# Patient Record
Sex: Female | Born: 1970 | Race: Black or African American | Hispanic: No | Marital: Single | Smoking: Never smoker
Health system: Southern US, Community
[De-identification: ages and names within clinical notes are randomized; demographics above are authoritative.]

## PROBLEM LIST (undated history)

## (undated) DIAGNOSIS — F419 Anxiety disorder, unspecified: Secondary | ICD-10-CM

## (undated) DIAGNOSIS — C50919 Malignant neoplasm of unspecified site of unspecified female breast: Secondary | ICD-10-CM

## (undated) DIAGNOSIS — Z9221 Personal history of antineoplastic chemotherapy: Secondary | ICD-10-CM

## (undated) DIAGNOSIS — T451X5A Adverse effect of antineoplastic and immunosuppressive drugs, initial encounter: Principal | ICD-10-CM

## (undated) DIAGNOSIS — K5120 Ulcerative (chronic) proctitis without complications: Secondary | ICD-10-CM

## (undated) DIAGNOSIS — D701 Agranulocytosis secondary to cancer chemotherapy: Principal | ICD-10-CM

## (undated) DIAGNOSIS — I10 Essential (primary) hypertension: Secondary | ICD-10-CM

## (undated) DIAGNOSIS — M1990 Unspecified osteoarthritis, unspecified site: Secondary | ICD-10-CM

## (undated) DIAGNOSIS — J45909 Unspecified asthma, uncomplicated: Secondary | ICD-10-CM

## (undated) DIAGNOSIS — K219 Gastro-esophageal reflux disease without esophagitis: Secondary | ICD-10-CM

## (undated) DIAGNOSIS — Z923 Personal history of irradiation: Secondary | ICD-10-CM

## (undated) DIAGNOSIS — E119 Type 2 diabetes mellitus without complications: Secondary | ICD-10-CM

## (undated) DIAGNOSIS — F32A Depression, unspecified: Secondary | ICD-10-CM

## (undated) DIAGNOSIS — Z9109 Other allergy status, other than to drugs and biological substances: Secondary | ICD-10-CM

## (undated) DIAGNOSIS — D500 Iron deficiency anemia secondary to blood loss (chronic): Secondary | ICD-10-CM

## (undated) DIAGNOSIS — C50411 Malignant neoplasm of upper-outer quadrant of right female breast: Secondary | ICD-10-CM

## (undated) DIAGNOSIS — L309 Dermatitis, unspecified: Secondary | ICD-10-CM

## (undated) HISTORY — DX: Ulcerative (chronic) proctitis without complications: K51.20

## (undated) HISTORY — DX: Iron deficiency anemia secondary to blood loss (chronic): D50.0

## (undated) HISTORY — DX: Type 2 diabetes mellitus without complications: E11.9

## (undated) HISTORY — DX: Other allergy status, other than to drugs and biological substances: Z91.09

## (undated) HISTORY — DX: Adverse effect of antineoplastic and immunosuppressive drugs, initial encounter: T45.1X5A

## (undated) HISTORY — DX: Malignant neoplasm of upper-outer quadrant of right female breast: C50.411

## (undated) HISTORY — DX: Dermatitis, unspecified: L30.9

## (undated) HISTORY — DX: Malignant neoplasm of unspecified site of unspecified female breast: C50.919

## (undated) HISTORY — PX: FLEXIBLE SIGMOIDOSCOPY: SHX1649

## (undated) HISTORY — DX: Gastro-esophageal reflux disease without esophagitis: K21.9

## (undated) HISTORY — DX: Agranulocytosis secondary to cancer chemotherapy: D70.1

## (undated) HISTORY — PX: ESOPHAGOGASTRODUODENOSCOPY: SHX1529

## (undated) HISTORY — PX: DILATION AND CURETTAGE OF UTERUS: SHX78

---

## 2001-06-15 ENCOUNTER — Ambulatory Visit (HOSPITAL_COMMUNITY): Admission: RE | Admit: 2001-06-15 | Discharge: 2001-06-15 | Payer: Self-pay | Admitting: Internal Medicine

## 2003-02-07 ENCOUNTER — Ambulatory Visit (HOSPITAL_COMMUNITY): Admission: RE | Admit: 2003-02-07 | Discharge: 2003-02-07 | Payer: Self-pay | Admitting: Internal Medicine

## 2003-04-10 ENCOUNTER — Observation Stay (HOSPITAL_COMMUNITY): Admission: RE | Admit: 2003-04-10 | Discharge: 2003-04-11 | Payer: Self-pay | Admitting: Obstetrics and Gynecology

## 2004-08-18 ENCOUNTER — Ambulatory Visit: Payer: Self-pay | Admitting: Internal Medicine

## 2004-10-29 ENCOUNTER — Ambulatory Visit: Payer: Self-pay | Admitting: Internal Medicine

## 2005-02-21 ENCOUNTER — Ambulatory Visit: Payer: Self-pay | Admitting: Cardiology

## 2007-02-05 ENCOUNTER — Ambulatory Visit: Payer: Self-pay | Admitting: Cardiology

## 2007-02-22 ENCOUNTER — Ambulatory Visit: Payer: Self-pay | Admitting: Cardiology

## 2007-03-07 ENCOUNTER — Ambulatory Visit: Payer: Self-pay | Admitting: Cardiology

## 2007-03-27 ENCOUNTER — Ambulatory Visit: Payer: Self-pay | Admitting: Cardiology

## 2007-09-25 ENCOUNTER — Encounter: Payer: Self-pay | Admitting: Endocrinology

## 2007-10-11 ENCOUNTER — Ambulatory Visit: Payer: Self-pay | Admitting: Endocrinology

## 2007-10-11 DIAGNOSIS — F329 Major depressive disorder, single episode, unspecified: Secondary | ICD-10-CM

## 2007-10-11 DIAGNOSIS — Z860100 Personal history of colon polyps, unspecified: Secondary | ICD-10-CM | POA: Insufficient documentation

## 2007-10-11 DIAGNOSIS — F32A Depression, unspecified: Secondary | ICD-10-CM | POA: Insufficient documentation

## 2007-10-11 DIAGNOSIS — Z87448 Personal history of other diseases of urinary system: Secondary | ICD-10-CM | POA: Insufficient documentation

## 2007-10-11 DIAGNOSIS — R519 Headache, unspecified: Secondary | ICD-10-CM | POA: Insufficient documentation

## 2007-10-11 DIAGNOSIS — E782 Mixed hyperlipidemia: Secondary | ICD-10-CM | POA: Insufficient documentation

## 2007-10-11 DIAGNOSIS — R51 Headache: Secondary | ICD-10-CM

## 2007-10-11 DIAGNOSIS — K219 Gastro-esophageal reflux disease without esophagitis: Secondary | ICD-10-CM

## 2007-10-11 DIAGNOSIS — K279 Peptic ulcer, site unspecified, unspecified as acute or chronic, without hemorrhage or perforation: Secondary | ICD-10-CM | POA: Insufficient documentation

## 2007-10-11 DIAGNOSIS — Z9189 Other specified personal risk factors, not elsewhere classified: Secondary | ICD-10-CM | POA: Insufficient documentation

## 2007-10-11 DIAGNOSIS — E109 Type 1 diabetes mellitus without complications: Secondary | ICD-10-CM | POA: Insufficient documentation

## 2007-10-11 DIAGNOSIS — D649 Anemia, unspecified: Secondary | ICD-10-CM

## 2007-11-01 ENCOUNTER — Ambulatory Visit: Payer: Self-pay | Admitting: Endocrinology

## 2007-11-01 DIAGNOSIS — K5190 Ulcerative colitis, unspecified, without complications: Secondary | ICD-10-CM

## 2007-11-15 ENCOUNTER — Ambulatory Visit: Payer: Self-pay | Admitting: Endocrinology

## 2007-12-07 ENCOUNTER — Ambulatory Visit: Payer: Self-pay | Admitting: Endocrinology

## 2007-12-10 ENCOUNTER — Encounter: Payer: Self-pay | Admitting: Endocrinology

## 2008-01-16 ENCOUNTER — Ambulatory Visit: Payer: Self-pay | Admitting: Endocrinology

## 2008-02-15 ENCOUNTER — Ambulatory Visit: Payer: Self-pay | Admitting: Endocrinology

## 2008-04-17 ENCOUNTER — Ambulatory Visit: Payer: Self-pay | Admitting: Endocrinology

## 2008-04-17 DIAGNOSIS — E041 Nontoxic single thyroid nodule: Secondary | ICD-10-CM

## 2008-07-10 ENCOUNTER — Ambulatory Visit: Payer: Self-pay | Admitting: Endocrinology

## 2008-07-18 ENCOUNTER — Ambulatory Visit: Payer: Self-pay | Admitting: Endocrinology

## 2008-08-14 ENCOUNTER — Ambulatory Visit: Payer: Self-pay | Admitting: Endocrinology

## 2008-09-11 ENCOUNTER — Ambulatory Visit: Payer: Self-pay | Admitting: Endocrinology

## 2008-12-16 ENCOUNTER — Ambulatory Visit: Payer: Self-pay | Admitting: Endocrinology

## 2008-12-16 LAB — CONVERTED CEMR LAB
Creatinine,U: 65.4 mg/dL
Microalb Creat Ratio: 7.6 mg/g (ref 0.0–30.0)
Microalb, Ur: 0.5 mg/dL (ref 0.0–1.9)

## 2009-02-06 ENCOUNTER — Ambulatory Visit: Payer: Self-pay | Admitting: Endocrinology

## 2009-02-27 ENCOUNTER — Ambulatory Visit: Payer: Self-pay | Admitting: Endocrinology

## 2009-02-27 LAB — CONVERTED CEMR LAB: Hgb A1c MFr Bld: 7.4 % — ABNORMAL HIGH (ref 4.6–6.5)

## 2009-05-29 ENCOUNTER — Ambulatory Visit: Payer: Self-pay | Admitting: Endocrinology

## 2010-02-02 NOTE — Assessment & Plan Note (Signed)
Summary: LOW BLOOD SUGAR/ EMT'S CAME TO HER HOUSE LAST NIGHT/NWS   Vital Signs:  Patient profile:   40 year old female Height:      63 inches (160.02 cm) Weight:      185.25 pounds (84.20 kg) O2 Sat:      98 % on Room air Temp:     97.6 degrees F (36.44 degrees C) oral Pulse rate:   87 / minute BP sitting:   124 / 82  (left arm) Cuff size:   large  Vitals Entered By: Gardenia Phlegm CMA (February 06, 2009 11:05 AM)  O2 Flow:  Room air CC: Low BS/ EMT came to house last night/ CF Is Patient Diabetic? Yes   Referring Provider:  [facility] Primary Provider:  Dr. Redmond School  CC:  Low BS/ EMT came to house last night/ CF.  History of Present Illness: pt had an episode of severe hypoglycemia last night at approx 10pm.  she says she awoke at approx midnight.  she says friends tell her to eat more.  she says she can't do so because she is not hungry.  she had a milder episode of hypoglycemia at 6 pm (before dinner).   pt says she is taking 1 extra unit of humalog for each 30 by which her cbg exceeds 120.    Current Medications (verified): 1)  Acyclovir 200 Mg Caps (Acyclovir) .... Take 1 By Mouth Qd 2)  Omeprazole 20 Mg Tbec (Omeprazole) .... Take 1 By Mouth Qd 3)  Zyrtec Allergy 10 Mg Tabs (Cetirizine Hcl) .... Take Prn 4)  Asacol 400 Mg Tbec (Mesalamine) .... Take 1 By Mouth Qd 5)  Crestor 10 Mg Tabs (Rosuvastatin Calcium) .... Take 1 By Mouth Qd 6)  Multi-Day Vitamins  Tabs (Multiple Vitamin) .[name].. 1 Daily 7)  Tylenol Extra Strength 500 Mg Tabs (Acetaminophen) .... As Needed 8)  Lantus Solostar 100 Unit/ml Soln (Insulin Glargine) .... 32 Units Qam 9)  Miralax  Powd (Polyethylene Glycol 3350) .Marland KitchenMarland Kitchen. 17 Ounces Once Aday 10)  Humalog 100 Unit/ml Soln (Insulin Lispro (Human)) .... Three Times A Day (Just Before Each Meal) 07-05-08 Units  Allergies (verified): No Known Drug Allergies  Past History:  Past Medical History: Last updated:  12/16/2008 Anemia-NOS Colonic polyps, hx of Diabetes mellitus, type I GERD Hyperlipidemia Depression  Review of Systems       The patient complains of weight gain.    Physical Exam  General:  obese.   Psych:  Alert and cooperative; normal mood and affect; normal attention span and concentration.     Impression & Recommendations:  Problem # 1:  DIABETES MELLITUS, TYPE I (ICD-250.01) she is not a candidate for aggressive glycemic control due to her severe hypoglycemia  Medications Added to Medication List This Visit: 1)  Lantus Solostar 100 Unit/ml Soln (Insulin glargine) .... 26 units qam  Other Orders: Est. Patient Level III (97026)  Patient Instructions: 1)  tests are being ordered for you today.  a few days after the test(s), please call [phone] to hear your test results. 2)  Please schedule a follow-up appointment in 2-3 weeks. 3)  continue humalog (just before each meal) 07-05-08 units 4)  decrease lantus to 26 units once daily. 5)  you should not take any extra units. Prescriptions: LANTUS SOLOSTAR 100 UNIT/ML SOLN (INSULIN GLARGINE) 26 units qam  #1 vial x 11   Entered and Authorized by:   Donavan Foil MD   Signed by:  Donavan Foil MD on 02/06/2009   Method used:   Electronically to        [facility] (retail)       [place] [address]       [place], [place]  [address]       Ph: [phone]       Fax: [phone]   RxID:   [id number] HUMALOG 100 UNIT/ML SOLN (INSULIN LISPRO (HUMAN)) three times a day (just before each meal) 07-05-08 units  #1 vial x 11   Entered and Authorized by:   Donavan Foil MD   Signed by:   Donavan Foil MD on 02/06/2009   Method used:   Electronically to        [facility] (retail)       [place] [address] [place], [place]  [address]       Ph: [phone]       Fax: [phone]   RxID:   [id number]

## 2010-02-02 NOTE — Assessment & Plan Note (Signed)
Summary: 2-3 WK F/U #/CD   Vital Signs:  Patient profile:   40 year old female Height:      63 inches (160.02 cm) Weight:      186.13 pounds (84.60 kg) O2 Sat:      97 % on Room air Temp:     97.8 degrees F (36.56 degrees C) oral Pulse rate:   78 / minute BP sitting:   108 / 72  (left arm) Cuff size:   large  Vitals Entered By: Gardenia Phlegm RMA (February 27, 2009 11:13 AM)  O2 Flow:  Room air CC: 2-3 week follow up/ pt states she is no longer taking Crestor/ CF Is Patient Diabetic? Yes   Referring Provider:  [facility] Primary Provider:  Dr. Redmond School  CC:  2-3 week follow up/ pt states she is no longer taking Crestor/ CF.  History of Present Illness: pt says she still has some hypoglycemia (usually in the afternoon), but feels better in general.  no cbg record, but states cbg's are highest (200's) at hs and in am.    Current Medications (verified): 1)  Acyclovir 200 Mg Caps (Acyclovir) .... Take 1 By Mouth Qd 2)  Omeprazole 20 Mg Tbec (Omeprazole) .... Take 1 By Mouth Qd 3)  Zyrtec Allergy 10 Mg Tabs (Cetirizine Hcl) .... Take Prn 4)  Asacol 400 Mg Tbec (Mesalamine) .... Take 1 By Mouth Qd 5)  Crestor 10 Mg Tabs (Rosuvastatin Calcium) .... Take 1 By Mouth Qd 6)  Multi-Day Vitamins  Tabs (Multiple Vitamin) .[name].. 1 Daily 7)  Tylenol Extra Strength 500 Mg Tabs (Acetaminophen) .... As Needed 8)  Lantus Solostar 100 Unit/ml Soln (Insulin Glargine) .... 26 Units Qam 9)  Miralax  Powd (Polyethylene Glycol 3350) .Marland KitchenMarland Kitchen. 17 Ounces Once Aday 10)  Humalog 100 Unit/ml Soln (Insulin Lispro (Human)) .... Three Times A Day (Just Before Each Meal) 07-05-08 Units  Allergies (verified): No Known Drug Allergies  Past History:  Past Medical History: Last updated: 12/16/2008 Anemia-NOS Colonic polyps, hx of Diabetes mellitus, type I GERD Hyperlipidemia Depression  Review of Systems  The patient denies syncope.    Physical Exam  General:  obese.  no  distress. Psych:  Alert and cooperative; normal mood and affect; normal attention span and concentration.   Additional Exam:  Hemoglobin A1C       [H]  7.4 %    Impression & Recommendations:  Problem # 1:  DIABETES MELLITUS, TYPE I (ICD-250.01) she needs only a small adjustment in her insulin.  Medications Added to Medication List This Visit: 1)  Humalog 100 Unit/ml Soln (Insulin lispro (human)) .... Three times a day (just before each meal) 07-04-09 units  Other Orders: TLB-A1C / Hgb A1C (Glycohemoglobin) (83036-A1C) Est. Patient Level III (23762)  Patient Instructions: 1)  tests are being ordered for you today.  a few days after the test(s), please call [phone] to hear your test results. 2)  Please schedule a follow-up appointment in 3 months. 3)  change humalog to (just before each meal) 07-04-09 units. 4)  continue lantus 26 units once daily.

## 2010-05-18 NOTE — Assessment & Plan Note (Signed)
[facility] OFFICE NOTE   NAME:Brittney [name], Brittney [name]                   MRN:          [id number]  DATE:03/27/2007                            DOB:          1970/02/09    REFERRING PHYSICIAN:  [name]   HISTORY OF PRESENT ILLNESS:  The patient is a 40 year old female with no  documented coronary artery disease, but cardiac risk factors include  insulin and diabetes mellitus and dyslipidemia.  The patient had a  Cardiolite study done initially which was equivocal with possibly some  evidence of ischemia.  The patient however reported very atypical pain.  Due to fact the patient was disinclined to proceed with a cardiac  catheterization, we gave her the option to proceed with a stress  echocardiographic study.  This study was done recently and was entirely  within normal limits with no evidence of ischemia.  The patient states  that the chest pain still remains atypical and is nonexertional.  As a  matter of fact, the patient was able to walk almost 11 minutes on the  treadmill with no chest pain.   MEDICATIONS:  1. Asacol 40 mg a day.  2. Protonix 40 mg a day.  3. Acyclovir 200 mg p.o. b.i.d.  4. Fluoxetine 10 mg p.o. daily.  5. Humalog insulin.   PHYSICAL EXAMINATION:  VITAL SIGNS:  Blood pressure is 117/76, heart  rate 73, weight 158 pounds.  Neck exam normal carotid stroke no carotid bruits.  LUNGS:  Clear breath sounds bilaterally.  HEART:  Regular rate and rhythm.  Normal S1, S2.  No murmur, rub, or  gallop.  ABDOMEN:  Soft, nontender with no rebound or guarding.  Good bowel  sounds.  Extremity exam no cyanosis, clubbing or edema.  NEURO:  Patient alert and grossly nonfocal.   PROBLEM LIST:  1. Atypical chest pain.      a.     Abnormal exercise stress Cardiolite study, ejection fraction       55%.      b.     Followup stress echo within normal limits (the patient did       not want to proceed  with cardiac catheterization).  2. Multiple risk factors.      a.     Insulin dependent diabetes mellitus.      b.     Dyslipidemia.  3. Gastroesophageal reflux disease.  4. Ulcerative colitis.   PLAN:  1. I have explained to the patient that I think it is very unlikely      that she has significant flow-limiting coronary artery disease.      Although she certainly is diabetic and likely has coronary disease      equivalent.  I do not think that she is at high risk at the present      time.  2. The patient did not favor cardiac catheterization previously.  I do      think a we have reassured her with a negative stress      echocardiographic study with good exercise tolerance.  3. The patient can follow up  with [name] to further monitor and      modify her risk factors including diabetes and dyslipidemia.     Ernestine Mcmurray, MD,FACC  Electronically Signed    GED/MedQ  DD: 03/27/2007  DT: 03/27/2007  Job #: [id number]   cc:   [name]

## 2010-05-18 NOTE — Assessment & Plan Note (Signed)
[name] OFFICE NOTE   NAME:[name], [name]                   MRN:          [id number]  DATE:02/22/2007                            DOB:          1970/02/18    REFERRING PHYSICIAN:  Gar [name]   PRIMARY CARDIOLOGIST:  Brittney [name].  ([place]).   REASON FOR CONSULTATION:  Chest pain, abnormal stress Cardiolite.   Brittney [name] is a very pleasant 40 year old female, with no documented  history of coronary artery disease, but with cardiac risk factors  notable for insulin-dependent diabetes mellitus and dyslipidemia.  She  is now referred to Brittney [name] for further evaluation of her recent  exercise stress Cardiolite, interpreted as abnormal, by Brittney [name], with suggestion of anterior ischemia.   The patient recently presented to Brittney [name] with complaint of chest  pain and was referred for this stress test.  She exercised nearly 9  minutes, achieving 10.1 METS, but with no associated chest discomfort or  diagnostic EKG changes.  Perfusion imaging, however, was interpreted as  suggestive of reversibility in the distal anterior wall and anterior  aspect of the apex.  She is now referred for further evaluation of these  findings.   Clinically, the patient presents with no antecedent history of  exertional angina pectoris.  The chest discomfort that she describes is  completely unpredictable in onset, and varies in duration from seconds  to several minutes.  There is also some suggestion that this is improved  by burping, at times with complete resolution.  There is no pleuritic  component, nor worsening by movement or walking.   The patient states that this has been going on for the past 5 years, or  so.  Although she has also been diagnosed with GERD, the symptoms are  dissimilar and not associated with dysphagia or occur after meals.  She  also had a routine treadmill test in 2007 for further  evaluation of this  discomfort, and this was negative as well.  The patient has not,  however, had prior 2-D echocardiography.   Electrocardiogram today reveals normal sinus rhythm are at 76 beats per  minute, with normal axis and nonspecific ST changes.   ALLERGIES:  No known drug allergies.   CURRENT MEDICATIONS:  Aspirin 81 daily, acyclovir 200 b.i.d., Humalog  sliding scale, insulin, Protonix and Asacol   PAST MEDICAL HISTORY:  1. Insulin-dependent diabetes mellitus.  2. Dyslipidemia  3. GERD.  4. Ulcerative colitis.   SURGICAL HISTORY:  C-section.   SOCIAL HISTORY:  The patient is single, has a 47-monthold daughter.  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   FAMILY HISTORY:  Negative for coronary artery disease or myocardial  infarction.   REVIEW OF SYSTEMS:  Denies history of hypertension.  Otherwise as noted  per HPI, remaining systems negative.   PHYSICAL EXAMINATION:  Blood pressure 122/79, pulse 80, regular weight  156.8.  GENERAL:  40year old female sitting upright in no distress.  HEENT:  Normocephalic, atraumatic.  NECK:  Palpable carotid pulse without bruits; no JVD.  LUNGS:  Clear to auscultation all fields.  HEART:  Regular rate and rhythm (S1, S2) significant murmurs.  ABDOMEN:  Protuberant, nontender, intact bowel sounds.  EXTREMITIES:  Palpable distal pulses with trace pedal edema.  NEURO:  Flat affect, but no focal deficit.   IMPRESSION:  1. Atypical chest pain.      a.     Recent abnormal, adequate exercise stress Cardiolite,       suggestive of possible anterior ischemia; EF 55%.      b.     Negative routine treadmill test in 2007.  2. Multiple cardiac risk factors.      a.     Insulin-dependent diabetes mellitus.      b.     Dyslipidemia.  3. Gastroesophageal reflux disease.  4. Ulcerative colitis.   PLAN:  1.  Following review with Brittney [name] of the patient's clinical      history, and in light of the recent abnormal stress Cardiolite,      recommendation is to proceed with a repeat stress test rather than      a cardiac catheterization.  The patient's history is quite atypical      for ischemia and we suspect that the recent abnormal perfusion      images are falsely positive, and most likely consistent with breast      attenuation.  Moreover, the patient is disinclined to undergo a      cardiac catheterization, at this point in time.  Therefore, we      recommend a repeat stress test with an exercise stress      echocardiogram, for increased specificity.  If this shows no      definite evidence of ischemia, then no further cardiac testing is      indicated at this point in time.  2. The patient will be provided with p.r.n. nitroglycerin in the      interim, so that this may also provide further diagnostic      information if she were to have chest pain relieved by      nitroglycerin.  3. Schedule early clinic follow-up with myself and Brittney [name] 1 month      for review of stress test results and further recommendations.      Brittney Serpe, PA-C  Electronically Signed      Ernestine Mcmurray, MD,FACC  Electronically Signed   GS/MedQ  DD: 02/22/2007  DT: 02/23/2007  Job #: [id number]

## 2010-05-21 NOTE — Op Note (Signed)
[facility]  Patient:    Brittney [name], Brittney [name] Visit Number: [id number] MRN: [id number]          Service Type: END Location: DAY Attending Physician:  Bridgette Habermann Dictated by:   Garfield Cornea, M.D. Proc. Date: 06/15/01 Admit Date:  06/15/2001   CC:         Hildred Laser, M.D.  [facility]   Operative Report  PROCEDURE:  Sigmoidoscopy with biopsy stool sampling.  INDICATIONS FOR PROCEDURE:  The patient is a 40 year old lady with a well documented distal proctocolitis who has developed recurrent rectal bleeding and diarrhea recently. She has failed to respond to a course of prednisone 20 mg orally daily, ______ one q.h.s. and high dose Asacol 1.6 g t.i.d.  A recent set of stool studies including C. difficile were negative. She is now undergoing sigmoidoscopy to reassess the extent of disease. This approach has been discussed with the patient at length at the bedside and previously in the office on June 12, 2001. Please see the documentation in medical record.  MONITORING:  The patient was placed in the left lateral decubitus position. O2 saturation, blood pressure, pulse and respirations were monitored throughout the entire procedure.  CONSCIOUS SEDATION:  None given.  INSTRUMENT:  Olympus video chip colonoscope.  FINDINGS:  Digital rectal examination revealed no abnormalities. She had no tenderness on digital exam.  ENDOSCOPIC FINDINGS:  The rectum was grossly abnormal. There was no normal vasculature seen. The rectal mucosa was quite raw injected with diffuse erosions and friability. There was no normal mucosa. These inflammatory changes extended up to 35 cm (up to 45 cm). The colonic mucosa appeared normal. From the level of 45 cm, the scope was withdrawn back. Biopsies of the distal sigmoid and rectal mucosa were taken for histologic study. Also stool residue was suctioned to repeat stool studies. The patient tolerated  the procedure well and was reacted in endoscopy.  IMPRESSION:  Marked inflammatory changes of the rectum and distal sigmoid to 35 cm consistent with active proctocolitis. Segmental biopsies taken, stool sample obtained.  RECOMMENDATIONS:  Increase prednisone to 40 mg orally daily while continuing Asacol and Rowasa enemas.  We talked about the long-term risk of higher dose prednisone and hopefully she will not need to be on this medication for a long time. We may need to consider other immunosuppressive therapy. Hopefully she will respond and not need surgery.  I have asked [name] to come back to see Dr. Laural Golden in two weeks. Dictated by:   Garfield Cornea, M.D. Attending Physician:  Bridgette Habermann DD:  06/15/01 TD:  06/17/01 Job: [id number]

## 2010-05-21 NOTE — H&P (Signed)
NAME:  Brittney [name], Brittney [name]                      ACCOUNT NO.:  [id number]   MEDICAL RECORD NO.:  [id number]                   PATIENT TYPE:  AMB   LOCATION:  DAY                                  FACILITY:  [facility]   PHYSICIAN:  Jonnie Kind, M.D.              DATE OF BIRTH:  03-28-1970   DATE OF ADMISSION:  DATE OF DISCHARGE:                                HISTORY & PHYSICAL   ADMISSION DIAGNOSIS:  Vulvar condylomata.   HISTORY OF PRESENT ILLNESS:  This 40 year old female referred to our office  courtesy of Dr. Gari Crown and Dr. Sandford Craze is being seen for  treatment of extensive vulvar condylomata.  [name] has been followed and  prefers to have surgical procedure at [facility] due to personal  considerations.  She has been referred.  Over the past couple of years a  relationship has resulted in the development of condylomata with poor  response to conservative measures. She is admitted for laser excision of the  extensive condylomata.  She is aware that there will be some residual  scarring and that with vulvar lesions, potential for recurrence of  additional condylomata and requiring subsequent therapies either as an  outpatient of repeat hospital treatment is unavoidable.   PAST MEDICAL HISTORY:  Positive for ulcerative colitis, type 2 diabetes,  hypercholesterolemia.   PAST SURGICAL HISTORY:  I&D of right axilla and also I&D of vulvar abscess  in the past.   GYNECOLOGIC HISTORY:  Positive for HSV type 2, GC, and HPV.   ALLERGIES:  None.   MEDICATIONS:  Humulin 70/30, Nor-QD, Asacol, lisinopril, Levsin, acyclovir,  Wellbutrin, Allegra.   PHYSICAL EXAMINATION:  VITAL SIGNS:  Height 5 feet 3 inches, weight 146.  Blood pressure 108/62.  GENERAL:  A cheerful, generally healthy African-American female, alert and  oriented x3.  HEENT:  Pupils equal, round, and reactive.  NECK:  Supple.  Trachea midline.  CARDIOVASCULAR:  Unremarkable.  ABDOMEN:  Slim  without masses.  PELVIC:  External genitalia multiparous with extensive condylomata lesions  up to 3 cm in diameter around the introitus.  Smaller new, freshly-growing  lesions are in the perianal area.  There appears to be none above the anal  verge on simple exam.  There are no lesions past the hymen remnants.  Speculum exam shows a normal cervix.  Uterus is anteflexed.  Adnexa  nontender.   IMPRESSION:  Vulvar condylomata requiring surgical excision with laser on  April 10, 2003.   ADDENDUM:  Specific medication doses are as follows:  1. Humulin 70/30 30 units q.a.m., 13 units q.p.m.  2. Humulin R sliding scale.  3. Nor-QD one daily.  4. Asacol 400 mg six tablets twice daily.  5. Allegra D 180 mg one tablet daily.  6. Lisinopril 5 mg one p.o. daily.  7. Acyclovir 400 mg one p.o. daily.  8. Levsin 0.125 mg one tablet daily p.r.n. .  9. Multivitamins one  p.o. daily.  10.      Wellbutrin XL 150 mg one tablet daily.  11.      Omeprazole 20 mg one capsule b.i.d.  12.      Tylenol 500 mg p.r.n. cramps, headache.     ___________________________________________                                         Jonnie Kind, M.D.   JVF/MEDQ  D:  04/10/2003  T:  04/10/2003  Job:  [id number]   cc:   Gari Crown  [address] [address] [place]  [place] [address]  Fax: [place]  [address] [place]  [place] [address]  Fax: [phone]

## 2010-05-21 NOTE — Op Note (Signed)
NAME:  Brittney [name], Brittney [name]                      ACCOUNT NO.:  [id number]   MEDICAL RECORD NO.:  [id number]                   PATIENT TYPE:  AMB   LOCATION:  DAY                                  FACILITY:  [facility]   PHYSICIAN:  Hildred Laser, M.D.                 DATE OF BIRTH:  February 12, 1970   DATE OF PROCEDURE:  02/07/2003  DATE OF DISCHARGE:                                 OPERATIVE REPORT   PROCEDURE:  Esophagogastroduodenoscopy.   ENDOSCOPIST:  Hildred Laser, M.D.   INDICATIONS:  Brittney [name] is a 40 year old African-American female with  recurrent postprandial chest pain.  This has been noncardiac.  She has had 2  normal EKGs.  She has chronic GERD and is presently maintained on omeprazole  40 mg q.a.m. and feels that her heartburn is well-controlled.  She is  undergoing diagnostic EGD.  The procedure and risks were reviewed the  patient and informed consent was obtained.   PREOPERATIVE MEDICATIONS:  Cetacaine spray for oropharyngeal topical  anesthesia, Demerol 50 mg IV and Versed 8 mg IV in divided dose.   FINDINGS:  Procedure performed in endoscopy suite.  The patient's vital  signs and O2 saturation were monitored during the procedure and remained  stable.  The patient was placed in the left lateral recumbent position and  Olympus videoscope was passed via the oropharynx without any difficulty into  the esophagus.   ESOPHAGUS:  Mucosa of the proximal third was normal.  Distally there was a 2-  to-3-mm polyp possibly a squamous papilloma which was ablated by cold  biopsy.  GE junction was unremarkable.  No hernia was seen today.  She has  history of sliding hiatal hernia.   STOMACH:  It was empty and distended very well with insufflation.  The folds  of the proximal stomach were normal.  Examination of the mucosa at body,  antrum, pyloric channel, as well as angularis, fundus, and cardia were  normal.   DUODENUM:  Examination of the bulb and postbulbar duodenum was normal.   The endoscope was withdrawn.  The patient tolerated the procedure well.   FINAL DIAGNOSES:  1. 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  2. No evidence of reflux esophagitis or peptic ulcer disease.   RECOMMENDATIONS:  1. She will continue antireflux measures and omeprazole as before.  2. Levsin SL t.i.d. p.r.n.  3. Will arrange for any upper abdominal ultrasound to be performed at [facility].      ___________________________________________                                            Hildred Laser, M.D.   NR/MEDQ  D:  02/07/2003  T:  02/07/2003  Job:  [id number]   cc:  Sandford Craze  [place]  [place] [address]  Fax: [phone]

## 2010-05-21 NOTE — H&P (Signed)
NAMEPALAK, TERCERO                        ACCOUNT NO.:  [id number]   MEDICAL RECORD NO.:  [id number]                  PATIENT TYPE:   LOCATION:                                       FACILITY:   PHYSICIAN:  Hildred Laser, M.D.                 DATE OF BIRTH:  01/22/1970   DATE OF ADMISSION:  01/21/2003  DATE OF DISCHARGE:                                HISTORY & PHYSICAL   PRIMARY CARE PHYSICIAN:  Dr. Sandford Craze.   CHIEF COMPLAINT:  1. Chest pain after eating.  2. Ulcerative colitis.   HISTORY OF PRESENT ILLNESS:  Patient is a 40 year old black female patient  well known to our practice for previous evaluation of GERD and ulcerative  colitis.  She presents today in follow-up.  She was last seen in June, 2004.  At that time she was having problems with postprandial belching.  She was  asked to increase her Prevacid to b.i.d.  She had a gastric emptying study  done which revealed mild gastroparesis with T1/2 of 90.8 minutes.  Unfortunately, she was unable to tolerate Reglan due to severe drowsiness.  She presented today for follow-up.  She states that after eating breakfast  she develops a chest pain which seems to last throughout the entire day.  She has been to our physician on two occasions and had two unremarkable EKGs  per her report.  She is getting very concerned about this and frustrated.  She is having these symptoms on a daily basis.  Currently she is on Prilosec  40 mg every morning.  Denies any dysphagia or odynophagia.  She is now on  Acyclovir daily for oral herpes, currently without any breakouts.  She  continues to have intermittent nausea at least a couple of times weekly.  This does not necessarily occur postprandially, however.  No episodes of  vomiting.  Denies any abdominal pain, melena or rectal bleeding.  Stools are  formed at 1-2 daily.   CURRENT MEDICATIONS:  1. Asacol 400 mg four tablets three times a day.  2. Humulin 70/30, 30 units in the  morning, 13 units in the p.m.  3. Multivitamin with iron daily.  4. Allegra 180 mg daily.  5. Humulin R sliding scale.  6. Lisinopril 10 mg daily.  7. Wellbutrin 150 mg daily.  8. Generic Prilosec 20 mg two tablets in the morning.  9. Acyclovir 400 mg daily.   ALLERGIES:  NO KNOWN DRUG ALLERGIES.   PAST MEDICAL HISTORY:  1. Ulcerative colitis, diagnosed as distal disease on flexible sigmoidoscopy     in 1997.  2. Insulin dependent diabetes mellitus.  3. Gastroesophageal reflux disease.  4. Seasonal allergies.  5. Oral herpes simplex virus.  6. Depression.   PAST SURGICAL HISTORY:  1. Dental surgery.  2. Flexible sigmoidoscopy as above.  3. She had an EGD in July, 2002 which revealed a small hiatal hernia.  She  has also had I&D of a boil right axilla and also perivaginally.   FAMILY HISTORY:  Father and mother have hypertension.  Mother also has  hypercholesterolemia.  She had a maternal uncle with lung cancer.  She has  an aunt and a cousin with breast cancer.  No family history of IBD or  colorectal cancer.   SOCIAL HISTORY:  She is single.  She has no children.  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  Has never  been a smoker.  Denies any alcohol use.   REVIEW OF SYSTEMS:  Please see HPI for GI.  GENERAL:  Denies any weight  loss.  CARDIOPULMONARY:  Atypical chest pain as outlined above.  No  shortness of breath or diaphoresis, palpitations.   PHYSICAL EXAMINATION:  VITALS:  Weight 149, blood pressure 110/72, pulse 80.  GENERAL:  A pleasant, well-nourished, well-developed black female in no  acute distress.  Skin warm and dry, no jaundice.  HEENT:  Conjunctiva are pink, sclera nonicteric.  Oropharyngeal mucosa moist  and pink.  No lesions, erythema or exudate.  No lymphadenopathy,  thyromegaly.  CHEST:  Lungs are clear to auscultation.  CARDIAC EXAM:  Reveals regular rate and rhythm, normal S1, S2.  No murmurs,  rubs or  gallops.  ABDOMEN:  Positive bowel sounds.  Soft, nontender, nondistended.  No  organomegaly or masses.  EXTREMITIES:  No edema.   IMPRESSION:  [name] is a pleasant 40 year old lady with a history of:  1. Gastroesophageal reflux disease.  She continues to have atypical chest     pain felt to be noncardiac in origin.  She is on Prilosec 40 mg daily and     previously has been on Prevacid two times daily with no relief.  It has     been two and a half years since her last upper endoscopy and I feel it is     reasonable at this point, since she is not responding to PPI therapy, for     [place] to repeat this study.  She also has mild gastroparesis although having     very limited symptoms at this time in the way of nausea and vomiting.  It     may be worsening her reflux, however.  Unfortunately, she is unable to     tolerate Reglan, there is limited other options.  2. Ulcerative colitis appears to be in remission at this time.   PLAN:  1. EGD in the near future.  2. She will continue Prilosec.  Although, I have asked her to take 20 mg in     the morning and one before either     lunch or supper.  3. Continue Asacol at current dose.  4. Further recommendations to follow.     _____________________________________  ___________________________________________  Neil Crouch, P.A.                      Hildred Laser, M.D.   LL/MEDQ  D:  01/21/2003  T:  01/21/2003  Job:  [id number]   cc:   Hildred Laser, M.D.  [address]  [place]  [place] [address]  Fax: [place]  [address] [place]  [place] [address]  Fax: [phone]

## 2010-05-21 NOTE — Op Note (Signed)
NAME:  Brittney [name], Brittney [name]                      ACCOUNT NO.:  [id number]   MEDICAL RECORD NO.:  [id number]                   PATIENT TYPE:  OBV   LOCATION:  A410                                 FACILITY:  [facility]   PHYSICIAN:  Jonnie Kind, M.D.              DATE OF BIRTH:  Nov 28, 1970   DATE OF PROCEDURE:  04/10/2003  DATE OF DISCHARGE:  04/11/2003                                 OPERATIVE REPORT   PREOPERATIVE DIAGNOSIS:  Massive vulvar condylomata.   POSTOPERATIVE DIAGNOSIS:  Massive vulvar condylomata.   PROCEDURE:  Bilateral partial vulvectomy.   SURGEON:  Jonnie Kind, M.D.   INDICATIONS:  A 40 year old female referred for extensive vulvar condylomata  which have essentially destroyed the lower portions of the labia majora,  bilaterally with extensive satellite lesions involving the paraclitorial  area, the clitoris itself, the posterior fourchette, the area around the  anus and the area between the labia minora.   DETAILS OF PROCEDURE:  The patient was taken to the operating room and  prepped and draped for a vaginal procedure with high-lithotomy leg support  in place.  She was draped with moistened towels and the Holmium laser  prepared for surgery.  Settings were ranged to allow for vulvar lesions.  After proper safety precautions were taken including masks and smoke plume  evacuation,  we proceeded to begin vaporization process.  We began at the  right inguinal crease with the Holmium laser tip, a near contact laser in,  held in the surgeon's left hand.  The numerous small satellite lesions in  the lateral aspects of the right labia majora were then treated with laser  destruction.  We then began to destroy the lesions on the surface of the  right labia majora.  Almost the entire surface of the right labia major was  involved.  Only a few small areas were left unaffected by the condyloma.  The inferior aspects of the right labia majora were so involved as to  necessitate excision of the entire surface of the labia majora.  This was  sent as a surgical specimen.  The medial aspects between the labia minora  were then treated.   Moving to the perianal area where a smaller number of fresher, small lesions  were present we proceeded to use laser vaporization, as needed, to  obliterate numerous tiny 3-mm-to-5-mm lesions in the perianal area.   A similar procedure was performed on the patient's left side with ultimately  a much improved cosmetic appearance and more physiologic appearance to the  labia noted.  There were lateral satellite lesions on the patient's left  side as well.  The paraclitorial hood was inspected and there were some  lesions there and the clitoris, itself, was involved and required  vaporization as necessary.  At the completion of the procedure, hemostasis  was quite good and we were then able to coat the perineum with anesthetic  lotion as  well as placing a local pudendal block.  The patient then sent to  the recovery in stable condition, was kept overnight for pain management.   ADDENDUM:  Approximately 1 hour 45 minutes spent in laser vaporization  process from start to finish--surgeon time.      ___________________________________________                                            Jonnie Kind, M.D.   JVF/MEDQ  D:  04/15/2003  T:  04/16/2003  Job:  [id number]   cc:   Jonnie Kind, M.D.  [place]  [place] [address]  Fax: [phone]

## 2010-05-21 NOTE — H&P (Signed)
NAME:  Brittney [name], Brittney [name] NO.:  [id number]   MEDICAL RECORD NO.:  [id number]                  PATIENT TYPE:   LOCATION:                                       FACILITY:   PHYSICIAN:  Hildred Laser, M.D.                 DATE OF BIRTH:  [birth year]   DATE OF ADMISSION:  DATE OF DISCHARGE:                                HISTORY & PHYSICAL   ADDENDUM:  Not previously mentioned,  patient has allergies to BENZOYL  PEROXIDE which causes hives.     _____________________________________  ___________________________________________  Neil Crouch, P.A.                      Hildred Laser, M.D.   LL/MEDQ  D:  01/21/2003  T:  01/21/2003  Job:  [id number]   cc:   Hildred Laser, M.D.  [address]  [place]  [place] [address]  Fax: [phone]

## 2010-07-13 ENCOUNTER — Ambulatory Visit (INDEPENDENT_AMBULATORY_CARE_PROVIDER_SITE_OTHER): Payer: Medicaid Other | Admitting: Internal Medicine

## 2010-07-13 DIAGNOSIS — K5289 Other specified noninfective gastroenteritis and colitis: Secondary | ICD-10-CM

## 2010-11-08 DIAGNOSIS — R079 Chest pain, unspecified: Secondary | ICD-10-CM

## 2011-07-04 ENCOUNTER — Encounter (INDEPENDENT_AMBULATORY_CARE_PROVIDER_SITE_OTHER): Payer: Self-pay | Admitting: Internal Medicine

## 2011-07-04 ENCOUNTER — Ambulatory Visit (INDEPENDENT_AMBULATORY_CARE_PROVIDER_SITE_OTHER): Payer: Medicaid Other | Admitting: Internal Medicine

## 2011-07-04 VITALS — BP 102/58 | HR 72 | Temp 98.4°F | Ht 63.0 in | Wt 196.1 lb

## 2011-07-04 DIAGNOSIS — K5120 Ulcerative (chronic) proctitis without complications: Secondary | ICD-10-CM

## 2011-07-04 LAB — CBC WITH DIFFERENTIAL/PLATELET
Basophils Relative: 0 % (ref 0–1)
Hemoglobin: 10.7 g/dL — ABNORMAL LOW (ref 12.0–15.0)
MCHC: 32.3 g/dL (ref 30.0–36.0)
Monocytes Relative: 7 % (ref 3–12)
Neutro Abs: 3.7 10*3/uL (ref 1.7–7.7)
Neutrophils Relative %: 59 % (ref 43–77)
Platelets: 227 10*3/uL (ref 150–400)
RBC: 4.3 MIL/uL (ref 3.87–5.11)

## 2011-07-04 MED ORDER — MESALAMINE 400 MG PO TBEC: 800.0000 mg | DELAYED_RELEASE_TABLET | Freq: Three times a day (TID) | ORAL | Status: DC

## 2011-07-04 NOTE — Progress Notes (Signed)
Subjective:     Patient ID: [name], female   DOB: Aug 01, 1970, 41 y.o.   MRN: [id number]  HPIAntonette is a 41 yr old female here today for a UC flare. She says she is passing some blood and mucous. She c/o abdominal cramping. Symptoms started about 2 weeks ago. She feels full after she eats. She is having 2-4 stools a day.  She is having a lot of gas. She has been off her Asacol since March when the insurance denied her medication. She was diagnosed with UC in 1997 on flexible sigmoidoscopy. She says she feels okay. Some days she feels tired and run down.  Review of Systems see hpi Current Outpatient Prescriptions  Medication Sig Dispense Refill  . acyclovir (ZOVIRAX) 200 MG capsule Take by mouth every 4 (four) hours while awake.      . cetirizine (ZYRTEC) 10 MG tablet Take 10 mg by mouth daily.      . insulin glargine (LANTUS) 100 UNIT/ML injection Inject 30 Units into the skin at bedtime.      . insulin lispro protamine-insulin lispro (HUMALOG 50/50) (50-50) 100 UNIT/ML SUSP Inject into the skin 3 (three) times daily. 6 units with every meal      . montelukast (SINGULAIR) 10 MG tablet Take 10 mg by mouth at bedtime.      [name] omeprazole (PRILOSEC) 20 MG capsule Take 20 mg by mouth daily.       Past Medical History  Diagnosis Date  . 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)   . Diabetes mellitus     Type 1 over 15 yrs  . Environmental allergies   . GERD (gastroesophageal reflux disease)    Past Surgical History  Procedure Date  . Cesarean section   . Dilation and curettage of uterus     x 2 for miscarriages   Family Status  Relation Status Death Age  . Mother Alive     hypertension, high cholesterol, and diabetes and recently diagnosed with non-hodgkins lymphoma of the stomach.  . Father Alive     hypertension  . Sister Alive     depression  . Brother Alive     in good health   History   Social History  . Marital Status: Single    Spouse Name: N/A    Number  of Children: N/A  . Years of Education: N/A   Occupational History  . Not on file.   Social History Main Topics  . Smoking status: Never Smoker   . Smokeless tobacco: Not on file  . Alcohol Use: No  . Drug Use: No  . Sexually Active: Not on file   Other Topics Concern  . Not on file   Social History Narrative  . No narrative on file   No Known Allergies     Objective:   Physical Exam Filed Vitals:   07/04/11 1533  Height: 5' 3"  (1.6 m)  Weight: 196 lb 1.6 oz (88.95 kg)   Alert and oriented. Skin warm and dry. Oral mucosa is moist.   . Sclera anicteric, conjunctivae is pink. Thyroid not enlarged. No cervical lymphadenopathy. Lungs clear. Heart regular rate and rhythm.  Abdomen is soft. Bowel sounds are positive. No hepatomegaly. No abdominal masses felt. No tenderness. Stool brown and guaiac positive.  No edema to lower extremities.       Assessment:    UC flare. She has been off her Asacol since March.    Plan:    CBC, Sed  rate. Samples of Asacol 867m TID given to patient.  Rx e-prescribed to pharmacy.

## 2011-07-04 NOTE — Patient Instructions (Addendum)
CBC and CRP today. Asacol 857m TID called to pharmacy. Samples for one month given to patient.  Instructions to take one three times a day.

## 2011-07-05 ENCOUNTER — Telehealth (INDEPENDENT_AMBULATORY_CARE_PROVIDER_SITE_OTHER): Payer: Self-pay | Admitting: *Deleted

## 2011-07-05 LAB — C-REACTIVE PROTEIN: CRP: 0.36 mg/dL (ref <0.60)

## 2011-07-05 NOTE — Telephone Encounter (Signed)
Patient would like her lab work sent to [name] (PCP).

## 2011-07-12 NOTE — Telephone Encounter (Signed)
Lab report to PCP

## 2011-07-14 ENCOUNTER — Encounter (INDEPENDENT_AMBULATORY_CARE_PROVIDER_SITE_OTHER): Payer: Self-pay | Admitting: *Deleted

## 2011-07-15 ENCOUNTER — Telehealth (INDEPENDENT_AMBULATORY_CARE_PROVIDER_SITE_OTHER): Payer: Self-pay | Admitting: *Deleted

## 2011-07-15 NOTE — Telephone Encounter (Signed)
Per Terri patient needs to be sch'd for flex sig (anemia). I've tried several times to reach pt by phone but haven't been able to, a letter was mailed asking her to our office to sch

## 2011-07-18 ENCOUNTER — Other Ambulatory Visit (INDEPENDENT_AMBULATORY_CARE_PROVIDER_SITE_OTHER): Payer: Self-pay | Admitting: *Deleted

## 2011-07-18 ENCOUNTER — Encounter (INDEPENDENT_AMBULATORY_CARE_PROVIDER_SITE_OTHER): Payer: Self-pay | Admitting: *Deleted

## 2011-07-18 ENCOUNTER — Encounter (HOSPITAL_COMMUNITY): Payer: Self-pay | Admitting: Pharmacy Technician

## 2011-07-18 DIAGNOSIS — D649 Anemia, unspecified: Secondary | ICD-10-CM

## 2011-07-27 ENCOUNTER — Encounter (HOSPITAL_COMMUNITY): Payer: Self-pay | Admitting: *Deleted

## 2011-07-27 ENCOUNTER — Encounter (HOSPITAL_COMMUNITY)
Admission: RE | Disposition: A | Discharge status: Home / Self Care | Payer: Self-pay | Source: Ambulatory Visit | Attending: Internal Medicine

## 2011-07-27 ENCOUNTER — Ambulatory Visit (HOSPITAL_COMMUNITY)
Admission: RE | Admit: 2011-07-27 | Discharge: 2011-07-27 | Disposition: A | Discharge status: Home / Self Care | Payer: Medicaid Other | Source: Ambulatory Visit | Attending: Internal Medicine | Admitting: Internal Medicine

## 2011-07-27 DIAGNOSIS — K6289 Other specified diseases of anus and rectum: Secondary | ICD-10-CM | POA: Insufficient documentation

## 2011-07-27 DIAGNOSIS — R198 Other specified symptoms and signs involving the digestive system and abdomen: Secondary | ICD-10-CM

## 2011-07-27 DIAGNOSIS — K5289 Other specified noninfective gastroenteritis and colitis: Secondary | ICD-10-CM | POA: Insufficient documentation

## 2011-07-27 DIAGNOSIS — K5190 Ulcerative colitis, unspecified, without complications: Secondary | ICD-10-CM

## 2011-07-27 DIAGNOSIS — K625 Hemorrhage of anus and rectum: Secondary | ICD-10-CM | POA: Insufficient documentation

## 2011-07-27 DIAGNOSIS — K5180 Other ulcerative colitis without complications: Secondary | ICD-10-CM

## 2011-07-27 DIAGNOSIS — E119 Type 2 diabetes mellitus without complications: Secondary | ICD-10-CM | POA: Insufficient documentation

## 2011-07-27 DIAGNOSIS — D649 Anemia, unspecified: Secondary | ICD-10-CM

## 2011-07-27 DIAGNOSIS — Z01812 Encounter for preprocedural laboratory examination: Secondary | ICD-10-CM | POA: Insufficient documentation

## 2011-07-27 HISTORY — PX: FLEXIBLE SIGMOIDOSCOPY: SHX5431

## 2011-07-27 LAB — GLUCOSE, CAPILLARY: Glucose-Capillary: 92 mg/dL (ref 70–99)

## 2011-07-27 SURGERY — SIGMOIDOSCOPY, FLEXIBLE
Anesthesia: Moderate Sedation

## 2011-07-27 MED ORDER — MIDAZOLAM HCL 5 MG/5ML IJ SOLN
INTRAMUSCULAR | Status: AC
  Filled 2011-07-27: qty 10

## 2011-07-27 MED ORDER — SIMETHICONE 40 MG/0.6ML PO SUSP
ORAL | Status: DC | PRN
  Administered 2011-07-27: 13:00:00

## 2011-07-27 MED ORDER — MEPERIDINE HCL 50 MG/ML IJ SOLN
INTRAMUSCULAR | Status: AC
  Filled 2011-07-27: qty 1

## 2011-07-27 MED ORDER — MEPERIDINE HCL 25 MG/ML IJ SOLN
INTRAMUSCULAR | Status: DC | PRN
  Administered 2011-07-27 (×2): 25 mg via INTRAVENOUS

## 2011-07-27 MED ORDER — SODIUM CHLORIDE 0.45 % IV SOLN
Freq: Once | INTRAVENOUS | Status: AC
  Administered 2011-07-27: 12:00:00 via INTRAVENOUS

## 2011-07-27 MED ORDER — MIDAZOLAM HCL 5 MG/5ML IJ SOLN
INTRAMUSCULAR | Status: DC | PRN
  Administered 2011-07-27: 2 mg via INTRAVENOUS
  Administered 2011-07-27: 3 mg via INTRAVENOUS

## 2011-07-27 NOTE — Op Note (Signed)
FLEXIBLE SIGMOIDOSCOPY  PROCEDURE REPORT  PATIENT:  Brittney [name]  MR#:  [id number] Birthdate:  [birth year], 41 y.o., female Endoscopist:  Dr. Rogene Houston, MD Referred By:  Dr. [name], MD Procedure Date: 07/27/2011  Procedure: Flexible sigmoidoscopy.  Indications: Patient is 41 year old African female with over  15 year history of distal ulcerative colitis who has noted rectal bleeding, flatulence and some change in her bowel habits. She is undergoing diagnostic flexible sigmoidoscopy  Informed Consent:  The procedure and risks were reviewed with the patient and informed consent was obtained.  Medications:  Demerol 50 mg IV Versed 5 mg IV  Description of procedure:  After a digital rectal exam was performed, that colonoscope was advanced from the anus through the rectum and colon to to hepatic flexure. As the scope was she withdrawn mucosa was carefully examined. While in the rectum scope was retroflexed to examine anorectal junction.  Findings:   Normal mucosa of transverse, descending and proximal sigmoid colon. Cosa of distal sigmoid colon with erythema friability and erosions. Transition at 30 cm from the anal margin. Diffuse involvement of the rectal mucosa with multiple erosions friability and loss of vascularity. No polyps or tumor identified in the segments that were examined.   Therapeutic/Diagnostic Maneuvers Performed:  Biopsies taken from mucosa of sigmoid colon and rectum.  Complications:  None   Impression:  Active distal ulcerative colitis with transition at 30 cm from anal margin. Biopsy taken from sigmoid colon and rectum.  Recommendations:  Standard instructions given. She will continue Asacol-HD at current dose. I will contact patient with results of biopsy and further recommendations.  Avonelle Viveros U  07/27/2011 1:49 PM  CC: Dr. [name], MD & Dr. Rayne Du ref. provider found

## 2011-07-27 NOTE — H&P (Signed)
Brittney [name] is an 41 y.o. female.   Chief Complaint: Patient is here for flexible sigmoidoscopy. HPI: Patient is 41 year old female with 16 year history of distal ulcerative colitis was been doing well until she ran out of her Asacol. She was told that had been discontinued she was not given any alternatives. She's been passing bright red blood per rectum. She's having to 4 bowel movements per day. She's also had excessive flatulence. She is suspected to have relapse of her UC. She is undergoing diagnostic flexible sigmoidoscopy. She denies abdominal pain anorexia or weight loss.  Past Medical History  Diagnosis Date  . 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)   . Diabetes mellitus     Type 1 over 15 yrs  . Environmental allergies   . GERD (gastroesophageal reflux disease)     Past Surgical History  Procedure Date  . Cesarean section   . Dilation and curettage of uterus     x 2 for miscarriages  . Flexible sigmoidoscopy   . Esophagogastroduodenoscopy     Family History  Problem Relation Age of Onset  . Colon cancer Cousin    Social History:  reports that she has never smoked. She does not have any smokeless tobacco history on file. She reports that she does not drink alcohol or use illicit drugs.  Allergies: No Known Allergies  Medications Prior to Admission  Medication Sig Dispense Refill  . cetirizine (ZYRTEC) 10 MG tablet Take 10 mg by mouth at bedtime.       [name] FLUoxetine (PROZAC) 20 MG tablet Take 20 mg by mouth daily as needed. For help with mood      . fluticasone (FLONASE) 50 MCG/ACT nasal spray Place 2 sprays into the nose daily as needed. For allergies      . ibuprofen (ADVIL,MOTRIN) 200 MG tablet Take 400 mg by mouth every 6 (six) hours as needed. For pain      . insulin glargine (LANTUS) 100 UNIT/ML injection Inject 30 Units into the skin at bedtime.      . insulin lispro protamine-insulin lispro (HUMALOG 50/50) (50-50) 100 UNIT/ML SUSP Inject 6 Units into the  skin 3 (three) times daily.       . mesalamine (ASACOL) 400 MG EC tablet Take 1,200 mg by mouth 3 (three) times daily.      . montelukast (SINGULAIR) 10 MG tablet Take 10 mg by mouth daily.       [name] omeprazole (PRILOSEC) 20 MG capsule Take 20 mg by mouth daily.      [name] acetaminophen (TYLENOL) 500 MG tablet Take 1,000 mg by mouth every 6 (six) hours as needed. For pain      . acyclovir (ZOVIRAX) 200 MG capsule Take 200 mg by mouth every 4 (four) hours while awake.       [name] Ketotifen Fumarate (ALLERGY EYE DROPS OP) Apply 1-2 drops to eye as needed. For dry eyes        Results for orders placed during the hospital encounter of 07/27/11 (from the past 48 hour(s))  GLUCOSE, CAPILLARY     Status: Normal   Collection Time   07/27/11 12:19 PM      Component Value Range Comment   Glucose-Capillary 92  70 - 99 mg/dL    No results found.  ROS  Blood pressure 152/93, pulse 70, temperature 98.2 F (36.8 C), temperature source Oral, resp. rate 10, last menstrual period 07/22/2011, SpO2 100.00%. Physical Exam  Constitutional: She appears well-developed and well-nourished.  HENT:  Mouth/Throat: Oropharynx is clear and moist.  Eyes: Conjunctivae are normal. No scleral icterus.  Neck: No thyromegaly present.  Cardiovascular: Normal rate, regular rhythm and normal heart sounds.   No murmur heard. Respiratory: Effort normal.  GI: Soft. She exhibits no distension and no mass. There is no tenderness.  Musculoskeletal: She exhibits no edema.  Lymphadenopathy:    She has no cervical adenopathy.  Neurological: She is alert.  Skin: Skin is warm and dry.     Assessment/Plan Rectal bleeding. History of ulcerative colitis. Flexible sigmoidoscopy.  Brittney [name] U 07/27/2011, 1:18 PM

## 2011-07-29 ENCOUNTER — Other Ambulatory Visit (INDEPENDENT_AMBULATORY_CARE_PROVIDER_SITE_OTHER): Payer: Self-pay | Admitting: Internal Medicine

## 2011-07-29 DIAGNOSIS — K5190 Ulcerative colitis, unspecified, without complications: Secondary | ICD-10-CM

## 2011-07-29 MED ORDER — HYDROCORTISONE 100 MG/60ML RE ENEM: 100.0000 mg | ENEMA | Freq: Every day | RECTAL | Status: DC

## 2011-08-01 ENCOUNTER — Telehealth (INDEPENDENT_AMBULATORY_CARE_PROVIDER_SITE_OTHER): Payer: Self-pay | Admitting: *Deleted

## 2011-08-01 NOTE — Telephone Encounter (Signed)
Patient states she didn't get rx fill for asachol HD because medicaid needed additional information, she wants someone to check in to tis for her, she can be reached at [phone]

## 2011-08-02 ENCOUNTER — Encounter (HOSPITAL_COMMUNITY): Payer: Self-pay | Admitting: Internal Medicine

## 2011-08-04 NOTE — Telephone Encounter (Signed)
Patient called and she will come and get samples of the Delzicol as she was to have picked up in June. Her Insurance will not pay for Asacol HD. She was given directions  on how to take this medication.

## 2011-08-09 ENCOUNTER — Encounter (INDEPENDENT_AMBULATORY_CARE_PROVIDER_SITE_OTHER): Payer: Self-pay | Admitting: *Deleted

## 2011-08-22 ENCOUNTER — Telehealth (INDEPENDENT_AMBULATORY_CARE_PROVIDER_SITE_OTHER): Payer: Self-pay | Admitting: *Deleted

## 2011-08-22 ENCOUNTER — Other Ambulatory Visit (INDEPENDENT_AMBULATORY_CARE_PROVIDER_SITE_OTHER): Payer: Self-pay | Admitting: Internal Medicine

## 2011-08-22 DIAGNOSIS — K5190 Ulcerative colitis, unspecified, without complications: Secondary | ICD-10-CM

## 2011-08-22 MED ORDER — PREDNISONE 10 MG PO TABS
10.0000 mg | ORAL_TABLET | Freq: Two times a day (BID) | ORAL | Status: DC
Start: 2011-08-22 — End: 2011-09-14

## 2011-08-22 NOTE — Telephone Encounter (Signed)
Patient needs office visit in 6 weeks

## 2011-08-22 NOTE — Telephone Encounter (Signed)
Brittney [name] called and states that she is still having problems just as she was prior to her procedure .She is taking Delzicol 3 pills in the morning and 3 in the evening. She continues to go to the bathroom 4-5 times daily ,and 7 at the most. Dr.Rehman to be made aware.

## 2011-08-22 NOTE — Telephone Encounter (Signed)
Recommendations given to patient by Dr.Rehman  Forwarded to Charna Busman to arrange appointment in 6 weeks

## 2011-08-22 NOTE — Telephone Encounter (Signed)
Patient's call returned. She has distal ulcerative colitis; Disease not controlled with oral mesalamine; Begin prednisone 10 mg by mouth twice a day for 2 week; then 15 mg daily for one week; 10 mg daily for one week and 5 mg daily for one week and stop. Prescription for prednisone sent to her pharmacy. Since patient is diabetic she will monitor her glucose is 4 times a day. She will call her office tomorrow so we could adjust dose of her insulin. Office visit in 6 weeks

## 2011-08-24 NOTE — Telephone Encounter (Signed)
Apt has been scheduled for 10/04/11 at 3:30 pm with Dr. Laural Golden.

## 2011-09-01 ENCOUNTER — Telehealth (INDEPENDENT_AMBULATORY_CARE_PROVIDER_SITE_OTHER): Payer: Self-pay | Admitting: Internal Medicine

## 2011-09-01 DIAGNOSIS — K5120 Ulcerative (chronic) proctitis without complications: Secondary | ICD-10-CM

## 2011-09-01 MED ORDER — MESALAMINE 400 MG PO CPDR: 1200.0000 mg | DELAYED_RELEASE_CAPSULE | Freq: Two times a day (BID) | ORAL | Status: DC

## 2011-09-01 NOTE — Telephone Encounter (Signed)
Rx for Delzicol 484m mg 3 tabs BID eprescribed to [facility]

## 2011-09-14 ENCOUNTER — Encounter (INDEPENDENT_AMBULATORY_CARE_PROVIDER_SITE_OTHER): Payer: Self-pay | Admitting: Internal Medicine

## 2011-09-14 ENCOUNTER — Ambulatory Visit (INDEPENDENT_AMBULATORY_CARE_PROVIDER_SITE_OTHER): Payer: Medicaid Other | Admitting: Internal Medicine

## 2011-09-14 VITALS — BP 130/56 | HR 84 | Temp 97.7°F | Ht 63.0 in | Wt 193.2 lb

## 2011-09-14 DIAGNOSIS — K5120 Ulcerative (chronic) proctitis without complications: Secondary | ICD-10-CM

## 2011-09-14 NOTE — Patient Instructions (Addendum)
Prednisone 54m x 1 week, then 15 mg. Progress report in 2 weeks.

## 2011-09-14 NOTE — Progress Notes (Signed)
Subjective:     Patient ID: Brittney [name], female   DOB: 10-11-1970, 41 y.o.   MRN: [id number]  HPI Brittney [name] is a 41 yr old female here today for f/u. She tells me she is not getting any better. She cannot eat any dairy products without having abdominal pain.  After she eats dinner, her stomach swells. This past Sunday, she ate dinner, and the rest of the evening she said she stayed in the bathroom with diarrhea. Her stools are liquid with mucous.  She has had symptoms for about a month.  She says that after she had the sigmoidoscopy, her symptoms are really not better. She is having greater than 5 BMs a day. She tells me her mouth gets dry. Blood sugar this am 60.  07/27/2011 Sigmoidoscopy: Impression:  Active distal ulcerative colitis with transition at 30 cm from anal margin.  Biopsy taken from sigmoid colon and rectum.     Review of Systems see hpi Current Outpatient Prescriptions  Medication Sig Dispense Refill  . acetaminophen (TYLENOL) 500 MG tablet Take 1,000 mg by mouth every 6 (six) hours as needed. For pain      . cetirizine (ZYRTEC) 10 MG tablet Take 10 mg by mouth at bedtime.       [name] FLUoxetine (PROZAC) 20 MG tablet Take 20 mg by mouth daily as needed. For help with mood      . fluticasone (FLONASE) 50 MCG/ACT nasal spray Place 2 sprays into the nose daily as needed. For allergies      . ibuprofen (ADVIL,MOTRIN) 200 MG tablet Take 400 mg by mouth every 6 (six) hours as needed. For pain      . insulin glargine (LANTUS) 100 UNIT/ML injection Inject 30 Units into the skin at bedtime.      . insulin lispro protamine-insulin lispro (HUMALOG 50/50) (50-50) 100 UNIT/ML SUSP Inject 6 Units into the skin 3 (three) times daily.       [name] Ketotifen Fumarate (ALLERGY EYE DROPS OP) Apply 1-2 drops to eye as needed. For dry eyes      . Mesalamine (ASACOL) 400 MG CPDR Take 3 capsules (1,200 mg total) by mouth 2 (two) times daily.  180 capsule  4  . montelukast (SINGULAIR) 10 MG tablet  Take 10 mg by mouth daily.       [name] omeprazole (PRILOSEC) 20 MG capsule Take 20 mg by mouth daily.      . predniSONE (DELTASONE) 10 MG tablet Take 10 mg by mouth daily.      [name] acyclovir (ZOVIRAX) 200 MG capsule Take 200 mg by mouth every 4 (four) hours while awake.       . hydrocortisone (CORTENEMA) 100 MG/60ML enema Place 1 enema (100 mg total) rectally at bedtime.  14 enema  0   Past Medical History  Diagnosis Date  . 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)   . Diabetes mellitus     Type 1 over 15 yrs  . Environmental allergies   . GERD (gastroesophageal reflux disease)    Past Surgical History  Procedure Date  . Cesarean section   . Dilation and curettage of uterus     x 2 for miscarriages  . Flexible sigmoidoscopy   . Esophagogastroduodenoscopy   . Flexible sigmoidoscopy 07/27/2011    Procedure: FLEXIBLE SIGMOIDOSCOPY;  Surgeon: Rogene Houston, MD;  Location: [facility];  Service: Endoscopy;  Laterality: N/A;  100   Family Status  Relation Status Death Age  . Mother Alive  hypertension, high cholesterol, and diabetes and recently diagnosed with non-hodgkins lymphoma of the stomach.  . Father Alive     hypertension  . Sister Alive     depression  . Brother Alive     in good health   History   Social History  . Marital Status: Single    Spouse Name: N/A    Number of Children: N/A  . Years of Education: N/A   Occupational History  . Not on file.   Social History Main Topics  . Smoking status: Never Smoker   . Smokeless tobacco: Not on file  . Alcohol Use: No  . Drug Use: No  . Sexually Active: Not on file   Other Topics Concern  . Not on file   Social History Narrative  . No narrative on file        Objective:   Physical Exam Filed Vitals:   09/14/11 1027  Height: 5' 3"  (1.6 m)  Weight: 193 lb 3.2 oz (87.635 kg)        Assessment:     UC flare. She is not in remission.    I discussed this case with Dr. Laural Golden. Plan:   Keep OV for  October 1st.     . Keep eye on Blood sugars.  Increase Prednisone to 88m x 1 week then 136mx 1 week. Call with a PR in 2 weeks.

## 2011-10-04 ENCOUNTER — Encounter (INDEPENDENT_AMBULATORY_CARE_PROVIDER_SITE_OTHER): Payer: Self-pay | Admitting: Internal Medicine

## 2011-10-04 ENCOUNTER — Ambulatory Visit (INDEPENDENT_AMBULATORY_CARE_PROVIDER_SITE_OTHER): Payer: Medicaid Other | Admitting: Internal Medicine

## 2011-10-04 VITALS — BP 116/74 | HR 72 | Temp 99.1°F | Resp 18 | Ht 63.0 in | Wt 195.0 lb

## 2011-10-04 DIAGNOSIS — K5190 Ulcerative colitis, unspecified, without complications: Secondary | ICD-10-CM

## 2011-10-04 DIAGNOSIS — D649 Anemia, unspecified: Secondary | ICD-10-CM

## 2011-10-04 MED ORDER — PREDNISONE 10 MG PO TABS: 7.5000 mg | ORAL_TABLET | Freq: Every day | ORAL | Status: DC

## 2011-10-04 MED ORDER — FERROUS SULFATE 325 (65 FE) MG PO TABS: 325.0000 mg | ORAL_TABLET | Freq: Every day | ORAL | Status: DC

## 2011-10-04 NOTE — Patient Instructions (Signed)
Increase prednisone to 7.5 mg by mouth daily. Please call me after you have reviewed information about 6 mercaptopurine or 6-MP.

## 2011-10-04 NOTE — Progress Notes (Signed)
Presenting complaint;  Followup for ulcerative colitis.  Subjective:  Patient is 41 year old African female with history of ulcerative colitis dating back to 70. She was last seen on 09/14/2011 by Ms. Deberah Castle NP. She was still having symptoms of active disease. Prednisone dose was increased to 20 mg daily. She has been tapering prednisone dose as recommended she dropped dose to 5 mg daily on 09/30/2011. She is still not feeling well. She is having 2-4 bowel movements per day. She is passing blood with her bowel movements multiple times in a week. Small to moderate and sometimes large amount. She complains of abdominal gurgling. She is afraid to pass flatness to she thinks she'll not pass stool as well. She remains with good appetite. She complains of abdominal pain primarily left lower quadrant of her abdomen. She has noted slight increase in glucose levels with prednisone.  Current Medications: Current Outpatient Prescriptions  Medication Sig Dispense Refill  . acetaminophen (TYLENOL) 500 MG tablet Take 1,000 mg by mouth every 6 (six) hours as needed. For pain      . acyclovir (ZOVIRAX) 200 MG capsule Take 200 mg by mouth every 4 (four) hours while awake.       . cetirizine (ZYRTEC) 10 MG tablet Take 10 mg by mouth at bedtime.       [name] FLUoxetine (PROZAC) 20 MG tablet Take 20 mg by mouth daily as needed. For help with mood      . fluticasone (FLONASE) 50 MCG/ACT nasal spray Place 2 sprays into the nose daily as needed. For allergies      . insulin glargine (LANTUS) 100 UNIT/ML injection Inject 30 Units into the skin at bedtime.      . insulin lispro protamine-insulin lispro (HUMALOG 50/50) (50-50) 100 UNIT/ML SUSP Inject 6 Units into the skin 3 (three) times daily.       [name] Ketotifen Fumarate (ALLERGY EYE DROPS OP) Apply 1-2 drops to eye as needed. For dry eyes      . Mesalamine (ASACOL) 400 MG CPDR Take 3 capsules (1,200 mg total) by mouth 2 (two) times daily.  180 capsule  4  .  montelukast (SINGULAIR) 10 MG tablet Take 10 mg by mouth daily.       [name] omeprazole (PRILOSEC) 20 MG capsule Take 20 mg by mouth daily.      . predniSONE (DELTASONE) 10 MG tablet Take 5 mg by mouth daily.       . hydrocortisone (CORTENEMA) 100 MG/60ML enema Place 1 enema (100 mg total) rectally at bedtime.  14 enema  0     Objective: Blood pressure 116/74, pulse 72, temperature 99.1 F (37.3 C), temperature source Oral, resp. rate 18, height 5' 3"  (1.6 m), weight 195 lb (88.451 kg), last menstrual period 09/30/2011. Patient is alert and in no acute distress. She has round facies. . Conjunctiva is pink. Sclera is nonicteric Oropharyngeal mucosa is normal. No neck masses or thyromegaly noted. Cardiac exam with regular rhythm normal S1 and S2. No murmur or gallop noted. Lungs are clear to auscultation. Abdomen is symmetrical soft with mild tenderness at LLQ. No organomegaly or masses.  No LE edema or clubbing noted.   Assessment:  Distal ulcerative colitis refractory to oral mesalamine and topical therapy. Partial response to prednisone but she could not stay on it for long because of diabetes. Options include 6-MP or biologic therapy. I would favor 6-MP.   Plan:  Increase prednisone to 7.5 mg by mouth daily. Patient advised to get acquainted  with 6-MP. If she is agreeable will check TPMT assay and begin 6 MP soon. In the meantime she will continue mesalamine.

## 2011-11-18 ENCOUNTER — Telehealth (INDEPENDENT_AMBULATORY_CARE_PROVIDER_SITE_OTHER): Payer: Self-pay | Admitting: *Deleted

## 2011-11-18 NOTE — Telephone Encounter (Signed)
Ms.Plagge called on 11/1411. She ask when should she tapper down with her Prednisone.Current dose 7.5 mg She states that she feels better except for the left side of abdomen,making sounds. This is before meals and will last all day. She has read about the 6 MP and has a real concern about it as it can cause cancer She also needs more Prednisone called in to [facility] Per Dr.Rehman 11/18/11 the patient may decrease prednisone to 5 mg if she is feeling better She should start taking a Probiotic my mouth daily and should be brought in for an appointment since she is still having problems She currently has an appointment in January with Dr.Rehman but due to problems she has been given an appointment with Terri for 11/21/11. Prednisone 10 mg take as directed #60 was called to [facility] Patient was made aware

## 2011-11-21 ENCOUNTER — Ambulatory Visit (INDEPENDENT_AMBULATORY_CARE_PROVIDER_SITE_OTHER): Payer: Medicaid Other | Admitting: Internal Medicine

## 2011-11-21 ENCOUNTER — Encounter (INDEPENDENT_AMBULATORY_CARE_PROVIDER_SITE_OTHER): Payer: Self-pay | Admitting: Internal Medicine

## 2011-11-21 VITALS — BP 102/56 | HR 76 | Temp 97.9°F | Ht 63.0 in | Wt 194.0 lb

## 2011-11-21 DIAGNOSIS — K5120 Ulcerative (chronic) proctitis without complications: Secondary | ICD-10-CM

## 2011-11-21 NOTE — Patient Instructions (Addendum)
Sedrate and OV with Dr. Laural Golden in jANUARY

## 2011-11-21 NOTE — Progress Notes (Signed)
Subjective:     Patient ID: Brittney [name], female   DOB: April 28, 1970, 41 y.o.   MRN: [id number]  HPI  Here today for f/u of her UC. She says she is doing better. So far there has been no rectal bleeding with passage of stool.  She says her flare ended around September 30. It had started around 27th. She has a fear of starting the 6MP. She is having 2 stools and then may skip 2 or 3 days. Stools are formed. No mucous or blood in almost 2 months.  Diagnosed with UC in October of 1997. Appetite is good. No weight loss.  07/27/2011 Sigmoidoscopy: Impression:  Active distal ulcerative colitis with transition at 30 cm from anal margin.  Biopsy taken from sigmoid colon and rectum.   Review of Systems see hpi Current Outpatient Prescriptions  Medication Sig Dispense Refill  . acetaminophen (TYLENOL) 500 MG tablet Take 1,000 mg by mouth every 6 (six) hours as needed. For pain      . acyclovir (ZOVIRAX) 200 MG capsule Take 200 mg by mouth every 4 (four) hours while awake.       . cetirizine (ZYRTEC) 10 MG tablet Take 10 mg by mouth at bedtime.       . ferrous sulfate 325 (65 FE) MG tablet Take 1 tablet (325 mg total) by mouth daily with breakfast.    3  . FLUoxetine (PROZAC) 20 MG tablet Take 20 mg by mouth daily as needed. For help with mood      . fluticasone (FLONASE) 50 MCG/ACT nasal spray Place 2 sprays into the nose daily as needed. For allergies      . insulin glargine (LANTUS) 100 UNIT/ML injection Inject 30 Units into the skin at bedtime.      . insulin lispro protamine-insulin lispro (HUMALOG 50/50) (50-50) 100 UNIT/ML SUSP Inject 6 Units into the skin 3 (three) times daily.       [name] Ketotifen Fumarate (ALLERGY EYE DROPS OP) Apply 1-2 drops to eye as needed. For dry eyes      . montelukast (SINGULAIR) 10 MG tablet Take 10 mg by mouth daily.       [name] omeprazole (PRILOSEC) 20 MG capsule Take 20 mg by mouth daily.      . predniSONE (DELTASONE) 10 MG tablet Take 5 mg by mouth daily.      .  Mesalamine (ASACOL) 400 MG CPDR Take 3 capsules (1,200 mg total) by mouth 2 (two) times daily.  180 capsule  4   Past Medical History  Diagnosis Date  . 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)   . Diabetes mellitus     Type 1 over 15 yrs  . Environmental allergies   . GERD (gastroesophageal reflux disease)    Past Surgical History  Procedure Date  . Cesarean section   . Dilation and curettage of uterus     x 2 for miscarriages  . Flexible sigmoidoscopy   . Esophagogastroduodenoscopy   . Flexible sigmoidoscopy 07/27/2011    Procedure: FLEXIBLE SIGMOIDOSCOPY;  Surgeon: Rogene Houston, MD;  Location: [facility];  Service: Endoscopy;  Laterality: N/A;  100   No Known Allergies     Objective:   Physical Exam Filed Vitals:   11/21/11 1544  BP: 102/56  Pulse: 76  Temp: 97.9 F (36.6 C)  Height: 5' 3"  (1.6 m)  Weight: 194 lb (87.998 kg)   Alert and oriented. Skin warm and dry. Oral mucosa is moist.   .  Sclera anicteric, conjunctivae is pink. Thyroid not enlarged. No cervical lymphadenopathy. Lungs clear. Heart regular rate and rhythm.  Abdomen is soft. Bowel sounds are positive. No hepatomegaly. No abdominal masses felt. No tenderness.  No edema to lower extremities.      Assessment:    UC which hopefully is in remisson. No blood or mucous in her stools since the end of September. She is presently taking Prednisone.    Plan:       Prednisone 48m x 1 week then 2.528mx 1 week. Continue with Prednisone 2.79m59mill next OV Sed rate today

## 2012-01-10 ENCOUNTER — Encounter (INDEPENDENT_AMBULATORY_CARE_PROVIDER_SITE_OTHER): Payer: Self-pay | Admitting: Internal Medicine

## 2012-01-10 ENCOUNTER — Ambulatory Visit (INDEPENDENT_AMBULATORY_CARE_PROVIDER_SITE_OTHER): Payer: Medicaid Other | Admitting: Internal Medicine

## 2012-01-10 VITALS — BP 118/68 | HR 78 | Temp 97.2°F | Resp 18 | Ht 63.0 in | Wt 199.5 lb

## 2012-01-10 DIAGNOSIS — K5190 Ulcerative colitis, unspecified, without complications: Secondary | ICD-10-CM

## 2012-01-10 NOTE — Patient Instructions (Signed)
Call if diarrhea or rectal bleeding recurs.

## 2012-01-10 NOTE — Progress Notes (Signed)
Presenting complaint;  Followup for ulcerative colitis.  Subjective:  Brittney [name] is a 42 year old Afro-American female who has distal ulcerative colitis which was diagnosed in October 1997 was here for scheduled visit. Last visit was on 10/04/2011. She stopped her prednisone about 10 weeks ago and has not had relapse of bleeding or diarrhea. She is having 1-2 formed stools per day. At times she has hard stools. She is using polyethylene glycol usually once a week. She denies abdominal pain or cramps at times is rumbling. She had blood work by Brittney [name] last week and her iron stores were normal. She has a good appetite. She has gained 4 pounds since her last visit.  Current Medications: Current Outpatient Prescriptions  Medication Sig Dispense Refill  . acetaminophen (TYLENOL) 500 MG tablet Take 1,000 mg by mouth every 6 (six) hours as needed. For pain      . acyclovir (ZOVIRAX) 200 MG capsule Take 200 mg by mouth every 4 (four) hours while awake.       . cetirizine (ZYRTEC) 10 MG tablet Take 10 mg by mouth at bedtime.       . ferrous sulfate 325 (65 FE) MG tablet Take 1 tablet (325 mg total) by mouth daily with breakfast.    3  . FLUoxetine (PROZAC) 20 MG tablet Take 20 mg by mouth daily. For help with mood      . fluticasone (FLONASE) 50 MCG/ACT nasal spray Place 2 sprays into the nose daily as needed. For allergies      . gabapentin (NEURONTIN) 100 MG capsule Take 100 mg by mouth at bedtime.      . insulin glargine (LANTUS) 100 UNIT/ML injection Inject 30 Units into the skin at bedtime.      . insulin lispro protamine-insulin lispro (HUMALOG 50/50) (50-50) 100 UNIT/ML SUSP Inject 6 Units into the skin 3 (three) times daily.       [name] Ketotifen Fumarate (ALLERGY EYE DROPS OP) Apply 1-2 drops to eye as needed. For dry eyes      . Mesalamine (DELZICOL PO) Take 400 mg by mouth. Patient is taking 3 capsules by mouth twice daily      . montelukast (SINGULAIR) 10 MG tablet Take 10 mg by mouth daily.        [name] omeprazole (PRILOSEC) 20 MG capsule Take 20 mg by mouth daily.         Objective: Blood pressure 118/68, pulse 78, temperature 97.2 F (36.2 C), temperature source Oral, resp. rate 18, height 5' 3"  (1.6 m), weight 199 lb 8 oz (90.493 kg), last menstrual period 12/12/2011. Conjunctiva is pink. Sclera is nonicteric Oropharyngeal mucosa is normal. No neck masses or thyromegaly noted. Abdomen is soft and nontender without organomegaly or masses.  No LE edema or clubbing noted.  Labs/studies requested from Brittney [name]'s office.  Assessment:  Distal ulcerative colitis of 15 years duration. She is back in remission and having no side effects with oral mesalamine. Should she experience another relapse with double up on oral mesalamine and treat her with Uceris for 8 weeks. She has history of anemia. Recent lab studies reportedly normal.   Plan: Request copy of recent blood work from Brittney [name] office. Continue Delzicol at 1.2 g by mouth twice a day. Office visit in one year. Consider colonoscopy this year or next.

## 2012-06-12 ENCOUNTER — Encounter (INDEPENDENT_AMBULATORY_CARE_PROVIDER_SITE_OTHER): Payer: Self-pay

## 2012-06-27 ENCOUNTER — Other Ambulatory Visit (INDEPENDENT_AMBULATORY_CARE_PROVIDER_SITE_OTHER): Payer: Self-pay | Admitting: Internal Medicine

## 2012-06-27 DIAGNOSIS — K5190 Ulcerative colitis, unspecified, without complications: Secondary | ICD-10-CM

## 2012-08-06 ENCOUNTER — Telehealth (INDEPENDENT_AMBULATORY_CARE_PROVIDER_SITE_OTHER): Payer: Self-pay | Admitting: *Deleted

## 2012-08-06 NOTE — Telephone Encounter (Signed)
Kamaiyah called to check and see if the preauthorization for Delzicol had been received. Per Tammy, patient's insurance is needing another medication to be used first. Dr. Laural Golden will be back in the office 08/07/12 and she will get with him then call Shilo back. The return phone number is [phone]. Patient called and advised - Voices Understood.       5

## 2012-08-06 NOTE — Telephone Encounter (Signed)
Noted and will contact her when Dr.Rehman voices which of the preferred the patient should try.

## 2012-08-07 NOTE — Telephone Encounter (Signed)
Per Dr.Rehman the patient may try Apriso .375 mg - she will take 4 by mouth daily.  This will be called to [facility] when they open this morning, and the patient will be made aware.

## 2012-08-07 NOTE — Telephone Encounter (Signed)
The prescription was called to [facility] as noted below with 11 refills. He states that he will call the patient to make her aware.

## 2013-01-18 ENCOUNTER — Telehealth (INDEPENDENT_AMBULATORY_CARE_PROVIDER_SITE_OTHER): Payer: Self-pay | Admitting: *Deleted

## 2013-01-18 NOTE — Telephone Encounter (Signed)
For over the past 3 weeks, she has been filling really bloated. Took Murelax that helped her relax to have a bowel movement. The bloated feeling is still there. Her return phone number is [phone] or [phone].

## 2013-01-24 NOTE — Telephone Encounter (Signed)
I will review this with Dr.Rehman. Patient was recently seen in the office. Below are some of the notes from that visit. Distal ulcerative colitis of 15 years duration. She is back in remission and having no side effects with oral mesalamine. Should she experience another relapse with double up on oral mesalamine and treat her with Uceris for 8 weeks.  She has history of anemia. Recent lab studies reportedly normal.  Plan:  Request copy of recent blood work from [name] office.  Continue Delzicol at 1.2 g by mouth twice a day.  Office visit in one year.  Consider colonoscopy this year or next.

## 2013-01-24 NOTE — Telephone Encounter (Signed)
Per Joellen Jersey from [name] Office, last labs were in Oct. 2014. She is going to fax those over. Next apt for labs with them are 02/11/13 and if we needed any drawn to please fax them. [name] Office will be glad to get any additional labs needed. Alenna has been put on the 1 year recall list.  Didn't call patient.

## 2013-01-25 NOTE — Telephone Encounter (Signed)
Called both number listed and they were not in service at this time.

## 2013-01-25 NOTE — Telephone Encounter (Signed)
Patient called and advised that she could use Gas X or Phazyme for the bloating and that she also needed a office visit. This was left on patient's voice mail. Forwarded to [employer] for a Office visit.

## 2013-01-28 NOTE — Telephone Encounter (Signed)
Dorian scheduled a f/u apt on 04/09/13. Gave her the directions Tammy left on her phone and voices understood. She will try the directions given to her and will call if needed before her next apt.

## 2013-04-09 ENCOUNTER — Ambulatory Visit (INDEPENDENT_AMBULATORY_CARE_PROVIDER_SITE_OTHER): Payer: Medicaid Other | Admitting: Internal Medicine

## 2013-04-09 ENCOUNTER — Encounter (INDEPENDENT_AMBULATORY_CARE_PROVIDER_SITE_OTHER): Payer: Self-pay | Admitting: Internal Medicine

## 2013-04-09 VITALS — BP 110/72 | HR 76 | Temp 98.1°F | Resp 16 | Ht 63.0 in | Wt 196.9 lb

## 2013-04-09 DIAGNOSIS — K5190 Ulcerative colitis, unspecified, without complications: Secondary | ICD-10-CM

## 2013-04-09 DIAGNOSIS — R110 Nausea: Secondary | ICD-10-CM

## 2013-04-09 NOTE — Progress Notes (Signed)
Presenting complaint;  Followup for ulcerative colitis.  Subjective:  Patient is a 43 year old African female with history of distal ulcerative colitis which was diagnosed in 1997 who is here for scheduled visit. She was last seen in January 2014. She states she is doing well as far as her use he is concerned. On most days she has 1-2 formed stools. At times she is constipated and may go 2 days without a bowel movement. She is trying to eat more vegetables and fruits. She denies melena rectal bleeding or abdominal pain but passes mucus with bowel movements. At times her appetite is not good. She complains of nausea and has to take Phenergan least twice a week. She does not have heartburn as long as she stays on omeprazole. She also has been having Hiccups which started a few weeks ago. She has chronic low back pain and paresthesias in all extremities. Her weight is down by 3 pounds since her last visit.  Current Medications: Outpatient Encounter Prescriptions as of 04/09/2013  Medication Sig  . acetaminophen (TYLENOL) 500 MG tablet Take 1,000 mg by mouth every 6 (six) hours as needed. For pain  . acyclovir (ZOVIRAX) 200 MG capsule Take 200 mg by mouth daily.   [name] albuterol (PROAIR HFA) 108 (90 BASE) MCG/ACT inhaler Inhale 2 puffs into the lungs every 4 (four) hours as needed for wheezing or shortness of breath.  . cyclobenzaprine (FLEXERIL) 5 MG tablet Take 5 mg by mouth as needed for muscle spasms.  [name] EPINEPHrine (EPIPEN) 0.3 mg/0.3 mL SOAJ injection Inject into the muscle as needed.  . fexofenadine (ALLEGRA) 180 MG tablet Take 180 mg by mouth daily.  [name] FLUoxetine (PROZAC) 20 MG tablet Take 40 mg by mouth daily. For help with mood  . gabapentin (NEURONTIN) 100 MG capsule Take 300 mg by mouth 3 (three) times daily.   . insulin glargine (LANTUS) 100 UNIT/ML injection Inject 24-26 Units into the skin at bedtime.   . insulin lispro protamine-insulin lispro (HUMALOG 50/50) (50-50) 100 UNIT/ML SUSP  Inject 7 Units into the skin 3 (three) times daily.   . mesalamine (APRISO) 0.375 G 24 hr capsule Take 375 mg by mouth daily. Patient states that she takes the 4 capsules in the morning  . mometasone (NASONEX) 50 MCG/ACT nasal spray Place 2 sprays into the nose daily.  . Olopatadine HCl (PATADAY) 0.2 % SOLN Apply 1 drop to eye daily.  [name] omeprazole (PRILOSEC) 20 MG capsule Take 20 mg by mouth daily.  . promethazine (PHENERGAN) 25 MG tablet Take 25 mg by mouth every 8 (eight) hours as needed for nausea or vomiting.  . traMADol (ULTRAM) 50 MG tablet Take 50 mg by mouth 2 (two) times daily.  . [DISCONTINUED] cetirizine (ZYRTEC) 10 MG tablet Take 10 mg by mouth at bedtime.   . [DISCONTINUED] DELZICOL 400 MG CPDR TAKE THREE CAPSULES BY MOUTH TWICE DAILY  . [DISCONTINUED] ferrous sulfate 325 (65 FE) MG tablet Take 325 mg by mouth 2 (two) times daily.  . [DISCONTINUED] fluticasone (FLONASE) 50 MCG/ACT nasal spray Place 2 sprays into the nose daily as needed. For allergies  . [DISCONTINUED] Ketotifen Fumarate (ALLERGY EYE DROPS OP) Apply 1-2 drops to eye as needed. For dry eyes  . [DISCONTINUED] Mesalamine (DELZICOL PO) Take 400 mg by mouth. Patient is taking 3 capsules by mouth twice daily  . [DISCONTINUED] montelukast (SINGULAIR) 10 MG tablet Take 10 mg by mouth daily.      Objective: Blood pressure 110/72, pulse 76, temperature 98.1  F (36.7 C), temperature source Oral, resp. rate 16, height 5' 3"  (1.6 m), weight 196 lb 14.4 oz (89.313 kg), last menstrual period 03/14/2013. Patient is alert and in no acute distress. Conjunctiva is pink. Sclera is nonicteric Oropharyngeal mucosa is normal. No neck masses or thyromegaly noted. Cardiac exam with regular rhythm normal S1 and S2. No murmur or gallop noted. Lungs are clear to auscultation. Abdomen is full but soft and nontender without organomegaly or masses.  No LE edema or clubbing noted.  Labs/studies Results: Lab data from  02/11/2013. Hemoglobin A1c 7.7 Bilirubin 0.3, AP 86, AST 12, ALT 9, albumin 3.7, BUN 7, creatinine 0.69, serum calcium 9.0.   Assessment:  #1. Chronic ulcerative colitis. She has distal disease with disease duration of 18 years. She is due for surveillance colonoscopy. She's undergone 3 flexible sigmoidoscopies but never had a colonoscopy before. #2. Intermittent nausea and hiccups. Suspect these symptoms are secondary to diabetic gastroparesis. She had gastric emptying study in June 2004 and T. one half was 19 minutes. This study needs to be repeated to make sure she does not have significant gastroparesis.#3.    Plan:  Solid phase gastric emptying study. Continue Apriso at 1.5 g daily. Colonoscopy in September 2015. Office visit in one year.

## 2013-04-09 NOTE — Patient Instructions (Signed)
Hemoccult x1. Physician will call with results of gastric emptying study been completed. Colonoscopy to be scheduled in September 2015.

## 2013-04-10 ENCOUNTER — Telehealth (INDEPENDENT_AMBULATORY_CARE_PROVIDER_SITE_OTHER): Payer: Self-pay | Admitting: *Deleted

## 2013-04-10 DIAGNOSIS — K5190 Ulcerative colitis, unspecified, without complications: Secondary | ICD-10-CM

## 2013-04-10 NOTE — Telephone Encounter (Signed)
Per Dr.Rehman the patient will need to have a CBC drawn. After reviewing the labs rec'd from [facility] there was no CBC.

## 2013-04-11 ENCOUNTER — Encounter (HOSPITAL_COMMUNITY): Payer: Self-pay

## 2013-04-11 ENCOUNTER — Encounter (HOSPITAL_COMMUNITY)
Admission: RE | Admit: 2013-04-11 | Discharge: 2013-04-11 | Disposition: A | Discharge status: Home / Self Care | Payer: Medicaid Other | Source: Ambulatory Visit | Attending: Internal Medicine | Admitting: Internal Medicine

## 2013-04-11 ENCOUNTER — Encounter (INDEPENDENT_AMBULATORY_CARE_PROVIDER_SITE_OTHER): Payer: Self-pay | Admitting: *Deleted

## 2013-04-11 DIAGNOSIS — R1013 Epigastric pain: Principal | ICD-10-CM

## 2013-04-11 DIAGNOSIS — R110 Nausea: Secondary | ICD-10-CM | POA: Insufficient documentation

## 2013-04-11 DIAGNOSIS — K3189 Other diseases of stomach and duodenum: Secondary | ICD-10-CM | POA: Insufficient documentation

## 2013-04-11 DIAGNOSIS — K5190 Ulcerative colitis, unspecified, without complications: Secondary | ICD-10-CM | POA: Insufficient documentation

## 2013-04-11 MED ORDER — TECHNETIUM TC 99M SULFUR COLLOID
2.0000 | Freq: Once | INTRAVENOUS | Status: AC | PRN
  Administered 2013-04-11: 2 via INTRAVENOUS

## 2013-04-16 LAB — CBC
HCT: 31.7 % — ABNORMAL LOW (ref 36.0–46.0)
Hemoglobin: 10.1 g/dL — ABNORMAL LOW (ref 12.0–15.0)
MCH: 23.9 pg — AB (ref 26.0–34.0)
MCHC: 31.9 g/dL (ref 30.0–36.0)
MCV: 75.1 fL — AB (ref 78.0–100.0)
Platelets: 225 10*3/uL (ref 150–400)
RBC: 4.22 MIL/uL (ref 3.87–5.11)
RDW: 15.9 % — ABNORMAL HIGH (ref 11.5–15.5)
WBC: 5.2 10*3/uL (ref 4.0–10.5)

## 2013-06-03 ENCOUNTER — Telehealth (INDEPENDENT_AMBULATORY_CARE_PROVIDER_SITE_OTHER): Payer: Self-pay | Admitting: *Deleted

## 2013-06-03 NOTE — Telephone Encounter (Signed)
Brittney [name] has not been able to have a stool in 3 weeks. She is having lots of gas with mucus. Murelax has not worked for her. The return phone number is [phone].

## 2013-06-03 NOTE — Telephone Encounter (Signed)
Nulytely one gallon; drink as tolerated and stay-at-home until bowels move.  Please call patient

## 2013-06-03 NOTE — Telephone Encounter (Signed)
Forwarded to Dr.Rehman

## 2013-06-06 NOTE — Telephone Encounter (Signed)
Patient called and given dr.Rehman's recommendation. Nulytely/Golyte 1 gallon. Patient drinks 8 oz every 15 minutes,after 2 hours she may take a hour break. Then she will resume until the gallon is completed. She would need to be on clear liquids . Patient was called and made aware. She ask that we wait before calling it in. She has been to the bathroom with some results.

## 2013-08-23 ENCOUNTER — Telehealth (INDEPENDENT_AMBULATORY_CARE_PROVIDER_SITE_OTHER): Payer: Self-pay | Admitting: *Deleted

## 2013-08-23 ENCOUNTER — Ambulatory Visit (INDEPENDENT_AMBULATORY_CARE_PROVIDER_SITE_OTHER): Payer: Medicaid Other | Admitting: Internal Medicine

## 2013-08-23 ENCOUNTER — Other Ambulatory Visit (INDEPENDENT_AMBULATORY_CARE_PROVIDER_SITE_OTHER): Payer: Self-pay | Admitting: *Deleted

## 2013-08-23 ENCOUNTER — Encounter (INDEPENDENT_AMBULATORY_CARE_PROVIDER_SITE_OTHER): Payer: Self-pay | Admitting: Internal Medicine

## 2013-08-23 VITALS — BP 116/82 | HR 76 | Temp 97.8°F | Ht 63.0 in | Wt 196.5 lb

## 2013-08-23 DIAGNOSIS — K3184 Gastroparesis: Secondary | ICD-10-CM

## 2013-08-23 DIAGNOSIS — K5120 Ulcerative (chronic) proctitis without complications: Secondary | ICD-10-CM

## 2013-08-23 DIAGNOSIS — Z1211 Encounter for screening for malignant neoplasm of colon: Secondary | ICD-10-CM

## 2013-08-23 MED ORDER — PEG 3350-KCL-NA BICARB-NACL 420 G PO SOLR: 4000.0000 mL | Freq: Once | ORAL | Status: DC

## 2013-08-23 NOTE — Patient Instructions (Signed)
Colonoscopy.  The risks and benefits such as perforation, bleeding, and infection were reviewed with the patient and is agreeable. 

## 2013-08-23 NOTE — Progress Notes (Signed)
Subjective:     Patient ID: Brittney [name], female   DOB: 23-Aug-1970, 43 y.o.   MRN: [id number]  HPI Here today with c/o constipation.  She is passing a lot of gas. She tells me when she eats, she doesn't have a BM. She had a BM Wednesday and today (Friday). She tells me it has been almost a month since she had a BM up until Wednesday. The BM Wednesday was normal as was today.  She tells me her BM smell terrible. Her stools are sticky. She does occasionally see mucous. She rarely sees blood on the stool. She denies having any rectal pain.  Her appetite is good. She started back on the gastroparesis diet yesterday. If she eats regular food, her abdomen seem swollen and full. She is eating 3 meals a day.  She is not exercise. Her blood sugars have been running high to low. Lowest was yesterday at 36. Most of the time, if her BS is low it is after lunch.    She was last seen in April by Dr. Laural Golden for f/u of her UC. She was diagnosed with UC in 1997.   04/09/2013 Gastric emptying study:  Expected location of the stomach in the left upper quadrant.  Ingested meal empties the stomach gradually over the course of the  study with 69% retention at 60 min and 60% retention at 120 min  (normal retention less than 30% at a 120 min).  IMPRESSION:  Delayed gastric emptying.   She has had 3 flexible sigmoidoscopies in the pat but has never undergone a colonoscopy. 07/27/2011 Flexible Sigmoid:  UC, flatus, change in bowel movements. Dr. Laural Golden. Impression:  Active distal ulcerative colitis with transition at 30 cm from anal margin.  Biopsy taken from sigmoid colon and rectum.        Review of Systems Past Medical History  Diagnosis Date  . 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)   . Diabetes mellitus     Type 1 over 15 yrs  . Environmental allergies   . GERD (gastroesophageal reflux disease)     Past Surgical History  Procedure Laterality Date  . Cesarean section    . Dilation and  curettage of uterus      x 2 for miscarriages  . Flexible sigmoidoscopy    . Esophagogastroduodenoscopy    . Flexible sigmoidoscopy  07/27/2011    Procedure: FLEXIBLE SIGMOIDOSCOPY;  Surgeon: Rogene Houston, MD;  Location: [facility];  Service: Endoscopy;  Laterality: N/A;  100    Allergies  Allergen Reactions  . Statins Itching    Current Outpatient Prescriptions on File Prior to Visit  Medication Sig Dispense Refill  . acetaminophen (TYLENOL) 500 MG tablet Take 1,000 mg by mouth every 6 (six) hours as needed. For pain      . acyclovir (ZOVIRAX) 200 MG capsule Take 200 mg by mouth daily.       [name] albuterol (PROAIR HFA) 108 (90 BASE) MCG/ACT inhaler Inhale 2 puffs into the lungs every 4 (four) hours as needed for wheezing or shortness of breath.      . EPINEPHrine (EPIPEN) 0.3 mg/0.3 mL SOAJ injection Inject into the muscle as needed.      . fexofenadine (ALLEGRA) 180 MG tablet Take 180 mg by mouth daily.      [name] gabapentin (NEURONTIN) 100 MG capsule Take 300 mg by mouth 3 (three) times daily.       . insulin glargine (LANTUS) 100 UNIT/ML injection Inject 24-26  Units into the skin at bedtime.       . insulin lispro protamine-insulin lispro (HUMALOG 50/50) (50-50) 100 UNIT/ML SUSP Inject 7 Units into the skin 3 (three) times daily.       . mesalamine (APRISO) 0.375 G 24 hr capsule Take 375 mg by mouth daily. Patient states that she takes the 4 capsules in the morning      . mometasone (NASONEX) 50 MCG/ACT nasal spray Place 2 sprays into the nose daily.      . Olopatadine HCl (PATADAY) 0.2 % SOLN Apply 1 drop to eye daily.      [name] omeprazole (PRILOSEC) 20 MG capsule Take 20 mg by mouth daily.      . promethazine (PHENERGAN) 25 MG tablet Take 25 mg by mouth every 8 (eight) hours as needed for nausea or vomiting.       No current facility-administered medications on file prior to visit.        Objective:   Physical Exam  Filed Vitals:   08/23/13 0957  BP: 116/82  Pulse: 76  Temp:  97.8 F (36.6 C)  Height: 5' 3"  (1.6 m)  Weight: 196 lb 8 oz (89.132 kg)  Alert and oriented. Skin warm and dry. Oral mucosa is moist.   . Sclera anicteric, conjunctivae is pink. Thyroid not enlarged. No cervical lymphadenopathy. Lungs clear. Heart regular rate and rhythm.  Abdomen is soft. Bowel sounds are positive. No hepatomegaly. No abdominal masses felt. No tenderness.  No edema to lower extremities.       Assessment:     Chronic UC. Hx of distal disease duration of 18 years. Due for surveillance colonoscopy.   Gastroparesis Plan:     Eat 4-5 small meals a day. Will schedule a colonoscopy.  Continue the Apriso at 1.5 gm daily.

## 2013-08-23 NOTE — Telephone Encounter (Signed)
Patient needs trilyte 

## 2013-08-26 DIAGNOSIS — K3184 Gastroparesis: Secondary | ICD-10-CM | POA: Insufficient documentation

## 2013-08-26 DIAGNOSIS — E1143 Type 2 diabetes mellitus with diabetic autonomic (poly)neuropathy: Secondary | ICD-10-CM | POA: Insufficient documentation

## 2013-09-02 ENCOUNTER — Other Ambulatory Visit (INDEPENDENT_AMBULATORY_CARE_PROVIDER_SITE_OTHER): Payer: Self-pay | Admitting: Internal Medicine

## 2013-09-02 MED ORDER — MESALAMINE ER 0.375 G PO CP24: 375.0000 mg | ORAL_CAPSULE | Freq: Every day | ORAL | Status: DC

## 2013-09-27 ENCOUNTER — Encounter (HOSPITAL_COMMUNITY): Payer: Self-pay | Admitting: Pharmacy Technician

## 2013-10-09 ENCOUNTER — Encounter (HOSPITAL_COMMUNITY)
Admission: RE | Disposition: A | Discharge status: Home / Self Care | Payer: Self-pay | Source: Ambulatory Visit | Attending: Internal Medicine

## 2013-10-09 ENCOUNTER — Encounter (HOSPITAL_COMMUNITY): Payer: Self-pay | Admitting: *Deleted

## 2013-10-09 ENCOUNTER — Ambulatory Visit (HOSPITAL_COMMUNITY)
Admission: RE | Admit: 2013-10-09 | Discharge: 2013-10-09 | Disposition: A | Discharge status: Home / Self Care | Payer: Medicaid Other | Source: Ambulatory Visit | Attending: Internal Medicine | Admitting: Internal Medicine

## 2013-10-09 DIAGNOSIS — Z888 Allergy status to other drugs, medicaments and biological substances status: Secondary | ICD-10-CM | POA: Insufficient documentation

## 2013-10-09 DIAGNOSIS — K5130 Ulcerative (chronic) rectosigmoiditis without complications: Secondary | ICD-10-CM | POA: Insufficient documentation

## 2013-10-09 DIAGNOSIS — Z1211 Encounter for screening for malignant neoplasm of colon: Secondary | ICD-10-CM | POA: Diagnosis not present

## 2013-10-09 DIAGNOSIS — Z91010 Allergy to peanuts: Secondary | ICD-10-CM | POA: Insufficient documentation

## 2013-10-09 DIAGNOSIS — K219 Gastro-esophageal reflux disease without esophagitis: Secondary | ICD-10-CM | POA: Insufficient documentation

## 2013-10-09 DIAGNOSIS — E109 Type 1 diabetes mellitus without complications: Secondary | ICD-10-CM | POA: Insufficient documentation

## 2013-10-09 DIAGNOSIS — K51319 Ulcerative (chronic) rectosigmoiditis with unspecified complications: Secondary | ICD-10-CM | POA: Diagnosis not present

## 2013-10-09 DIAGNOSIS — Z800 Family history of malignant neoplasm of digestive organs: Secondary | ICD-10-CM | POA: Insufficient documentation

## 2013-10-09 DIAGNOSIS — Z91013 Allergy to seafood: Secondary | ICD-10-CM | POA: Diagnosis not present

## 2013-10-09 DIAGNOSIS — K5120 Ulcerative (chronic) proctitis without complications: Secondary | ICD-10-CM

## 2013-10-09 HISTORY — PX: COLONOSCOPY: SHX5424

## 2013-10-09 LAB — GLUCOSE, CAPILLARY: Glucose-Capillary: 273 mg/dL — ABNORMAL HIGH (ref 70–99)

## 2013-10-09 SURGERY — COLONOSCOPY
Anesthesia: Moderate Sedation

## 2013-10-09 MED ORDER — MEPERIDINE HCL 50 MG/ML IJ SOLN
INTRAMUSCULAR | Status: DC | PRN
  Administered 2013-10-09 (×2): 25 mg via INTRAVENOUS

## 2013-10-09 MED ORDER — MIDAZOLAM HCL 5 MG/5ML IJ SOLN
INTRAMUSCULAR | Status: AC
  Filled 2013-10-09: qty 10

## 2013-10-09 MED ORDER — SIMETHICONE 40 MG/0.6ML PO SUSP
ORAL | Status: DC | PRN
  Administered 2013-10-09: 13:00:00

## 2013-10-09 MED ORDER — MIDAZOLAM HCL 5 MG/5ML IJ SOLN
INTRAMUSCULAR | Status: DC | PRN
  Administered 2013-10-09 (×4): 2 mg via INTRAVENOUS

## 2013-10-09 MED ORDER — MEPERIDINE HCL 50 MG/ML IJ SOLN
INTRAMUSCULAR | Status: AC
  Filled 2013-10-09: qty 1

## 2013-10-09 MED ORDER — SODIUM CHLORIDE 0.9 % IV SOLN
INTRAVENOUS | Status: DC
  Administered 2013-10-09: 13:00:00 via INTRAVENOUS

## 2013-10-09 NOTE — H&P (Signed)
Brittney [name] is an 43 y.o. female.   Chief Complaint: Patient is here for colonoscopy. HPI: Patient is a 43 year old African American female who is itching or history of distal ulcerative colitis. She is felt to be in remission at the present time. She is here for surveillance colonoscopy. She denies abdominal pain rectal bleeding or diarrhea. She is presently on Apriso 1.5 g daily.  Past Medical History  Diagnosis Date  . 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)   . Diabetes mellitus     Type 1 over 15 yrs  . Environmental allergies   . GERD (gastroesophageal reflux disease)     Past Surgical History  Procedure Laterality Date  . Cesarean section    . Dilation and curettage of uterus      x 2 for miscarriages  . Flexible sigmoidoscopy    . Esophagogastroduodenoscopy    . Flexible sigmoidoscopy  07/27/2011    Procedure: FLEXIBLE SIGMOIDOSCOPY;  Surgeon: Rogene Houston, MD;  Location: [facility];  Service: Endoscopy;  Laterality: N/A;  100    Family History  Problem Relation Age of Onset  . Colon cancer Cousin    Social History:  reports that she has never smoked. She has never used smokeless tobacco. She reports that she does not drink alcohol or use illicit drugs.  Allergies:  Allergies  Allergen Reactions  . Statins Itching  . Peanuts [Peanut Oil] Itching  . Shellfish Allergy Itching    Medications Prior to Admission  Medication Sig Dispense Refill  . acyclovir (ZOVIRAX) 200 MG capsule Take 200 mg by mouth daily.       [name] gabapentin (NEURONTIN) 100 MG capsule Take 300 mg by mouth 3 (three) times daily.       . hydrOXYzine (ATARAX/VISTARIL) 10 MG tablet Take 25 mg by mouth at bedtime.       . insulin glargine (LANTUS) 100 UNIT/ML injection Inject 24-26 Units into the skin at bedtime.       . insulin lispro protamine-insulin lispro (HUMALOG 50/50) (50-50) 100 UNIT/ML SUSP Inject 7 Units into the skin 3 (three) times daily.       . Melatonin 1 MG TABS Take 2 mg  by mouth at bedtime.      . mesalamine (APRISO) 0.375 G 24 hr capsule Take 1 capsule (0.375 g total) by mouth daily. Patient states that she takes the 4 capsules in the morning  120 capsule  3  . mometasone (NASONEX) 50 MCG/ACT nasal spray Place 2 sprays into the nose daily.      [name] omeprazole (PRILOSEC) 20 MG capsule Take 20 mg by mouth daily.      . polyethylene glycol-electrolytes (TRILYTE) 420 G solution Take 4,000 mLs by mouth once.  4000 mL  0  . acetaminophen (TYLENOL) 500 MG tablet Take 1,000 mg by mouth every 6 (six) hours as needed. For pain      . EPINEPHrine (EPIPEN) 0.3 mg/0.3 mL SOAJ injection Inject into the muscle as needed.      . fexofenadine (ALLEGRA) 180 MG tablet Take 180 mg by mouth daily.      . naproxen sodium (ANAPROX) 220 MG tablet Take 220 mg by mouth daily as needed (pain).      . Olopatadine HCl (PATADAY) 0.2 % SOLN Apply 1 drop to eye daily.      . promethazine (PHENERGAN) 25 MG tablet Take 25 mg by mouth every 8 (eight) hours as needed for nausea or vomiting.  Results for orders placed during the hospital encounter of 10/09/13 (from the past 48 hour(s))  GLUCOSE, CAPILLARY     Status: Abnormal   Collection Time    10/09/13 12:37 PM      Result Value Ref Range   Glucose-Capillary 273 (*) 70 - 99 mg/dL   Comment 1 Documented in Chart     No results found.  ROS  Blood pressure 154/87, pulse 79, temperature 97.7 F (36.5 C), temperature source Oral, resp. rate 18, height 5' 3"  (1.6 m), weight 194 lb (87.998 kg), last menstrual period 10/02/2013, SpO2 97.00%. Physical Exam  Constitutional: She appears well-developed and well-nourished.  HENT:  Mouth/Throat: Oropharynx is clear and moist.  Eyes: Conjunctivae are normal. No scleral icterus.  Neck: No thyromegaly present.  Cardiovascular: Normal rate, regular rhythm and normal heart sounds.   No murmur heard. Respiratory: Effort normal and breath sounds normal.  GI: Soft. She exhibits no distension and  no mass. There is no tenderness.  Musculoskeletal: She exhibits no edema.  Lymphadenopathy:    She has no cervical adenopathy.  Neurological: She is alert.  Skin: Skin is warm and dry.     Assessment/Plan Chronic distal ulcerative colitis. Disease duration 18 years. Surveillance colonoscopy.  Shatera Rennert U 10/09/2013, 1:02 PM

## 2013-10-09 NOTE — Discharge Instructions (Signed)
Resume usual medications and diet. No driving for 24 hours. Physician will call with biopsy   Colonoscopy, Care After Refer to this sheet in the next few weeks. These instructions provide you with information on caring for yourself after your procedure. Your health care provider may also give you more specific instructions. Your treatment has been planned according to current medical practices, but problems sometimes occur. Call your health care provider if you have any problems or questions after your procedure. WHAT TO EXPECT AFTER THE PROCEDURE  After your procedure, it is typical to have the following:  A small amount of blood in your stool.  Moderate amounts of gas and mild abdominal cramping or bloating. HOME CARE INSTRUCTIONS  Do not drive, operate machinery, or sign important documents for 24 hours.  You may shower and resume your regular physical activities, but move at a slower pace for the first 24 hours.  Take frequent rest periods for the first 24 hours.  Walk around or put a warm pack on your abdomen to help reduce abdominal cramping and bloating.  Drink enough fluids to keep your urine clear or pale yellow.  You may resume your normal diet as instructed by your health care provider. Avoid heavy or fried foods that are hard to digest.  Avoid drinking alcohol for 24 hours or as instructed by your health care provider.  Only take over-the-counter or prescription medicines as directed by your health care provider.  If a tissue sample (biopsy) was taken during your procedure:  Do not take aspirin or blood thinners for 7 days, or as instructed by your health care provider.  Do not drink alcohol for 7 days, or as instructed by your health care provider.  Eat soft foods for the first 24 hours. SEEK MEDICAL CARE IF: You have persistent spotting of blood in your stool 2-3 days after the procedure. SEEK IMMEDIATE MEDICAL CARE IF:  You have more than a small spotting of  blood in your stool.  You pass large blood clots in your stool.  Your abdomen is swollen (distended).  You have nausea or vomiting.  You have a fever.  You have increasing abdominal pain that is not relieved with medicine. Document Released: 08/04/2003 Document Revised: 10/10/2012 Document Reviewed: 08/27/2012 [facility] Patient Information 2015 [place], [place]. This information is not intended to replace advice given to you by your health care provider. Make sure you discuss any questions you have with your health care provider.

## 2013-10-09 NOTE — Op Note (Signed)
[facility] [address] [place], [address]   COLONOSCOPY PROCEDURE REPORT     EXAM DATE: 10/09/2013  PATIENT NAME:      Brittney [name], Brittney [name]           MR #: [id number] BIRTHDATE:       [birth year]      VISIT #:     [id number]  ATTENDING:     Hildred Laser, MD     STATUS:     outpatient REFERRING MD:      [name], M.D. ASA CLASS:        Class I  INDICATIONS:  The patient is a 43 yr old female here for a colonoscopy due to high risk previously diagnosed UC proctosigmoid.  PROCEDURE PERFORMED:     Colonoscopy, surveillance and Colonoscopy with biopsy MEDICATIONS:     Cetacaine spray for oral pharyngeal topical anesthesia, Meperidine (Demerol) 50 mg IV, and Versed 8 mg IV  ESTIMATED BLOOD LOSS:     None  CONSENT: The patient understands the risks and benefits of the procedure and understands that these risks include, but are not limited to: sedation, allergic reaction, infection, perforation and/or bleeding. Alternative means of evaluation and treatment include, among others: physical exam, x-rays, and/or surgical intervention. The patient elects to proceed with this endoscopic procedure.  DESCRIPTION OF PROCEDURE: During intra-op preparation period all mechanical & medical equipment was checked for proper function. Hand hygiene and appropriate measures for infection prevention was taken. After the risks, benefits and alternatives of the procedure were thoroughly explained, Informed consent was verified, confirmed and timeout was successfully executed by the treatment team. A digital exam revealed no abnormalities of the rectum.      The [id number] ([id number]) endoscope was introduced through the anus and advanced to the terminal ileum which was intubated for a short distance. No adverse events experienced. The prep was excellent.. The instrument was then slowly withdrawn as the colon was fully examined.   COLON FINDINGS: The examined terminal  ileum appeared to be normal. A small patch of abnormal mucosa was found in the sigmoid colon. The mucosa was erythematous and had granularity.  This was likely consistent with ulcerative colitis disease.  Multiple biopsies were performed.   A diffuse patch of abnormal mucosa was found in the rectum.  The mucosa was edematous, erythematous and had erosions. This was likely consistent with ulcerative colitis disease. Multiple biopsies of the area were performed.   Small hypertrophic anal papilla was found in the anal canal. Retroflexion was performed.  The scope was then completely withdrawn from the patient and the procedure terminated. WITHDRAWAL TIME: 11 minutes 0 seconds    ADVERSE EVENTS:      There were no immediate complications.  IMPRESSIONS:     1.  The examined terminal ileum appeared to be normal 2.  Small abnormal mucosa was found in the sigmoid colon; The mucosa was erythematous and had granularity; multiple biopsies were performed 3.  Diffuse abnormal mucosa was found in the rectum; The mucosa was edematous, erythematous and had erosions; multiple biopsies of the area were performed 4.  Small hypertrophic anal papilla in the anal canal   comment;  Mild changes of colitis involving rectum and in patch and sigmoid colon. Distal colitis is significantly improved sincelast sigmoidoscopy of July 2013.  RECOMMENDATIONS:     1.  Await biopsy results 2.  next colonoscopy in 5 years.   Hildred Laser, MD eSigned:  Hildred Laser, MD 10/09/2013 2:43 PM  cc:  CPT CODES: ICD CODES:  The ICD and CPT codes recommended by this software are interpretations from the data that the clinical staff has captured with the software.  The verification of the translation of this report to the ICD and CPT codes and modifiers is the sole responsibility of the health care institution and practicing physician where this report was generated.  [employer] will not  be held responsible for the validity of the ICD and CPT codes included on this report.  AMA assumes no liability for data contained or not contained herein. CPT is a Designer, television/film set of the [employer].   PATIENT NAME:  Brittney [name], Brittney [name] MR#: [id number]

## 2013-10-11 ENCOUNTER — Encounter (HOSPITAL_COMMUNITY): Payer: Self-pay | Admitting: Internal Medicine

## 2013-10-30 ENCOUNTER — Encounter (INDEPENDENT_AMBULATORY_CARE_PROVIDER_SITE_OTHER): Payer: Self-pay | Admitting: *Deleted

## 2013-11-08 ENCOUNTER — Ambulatory Visit (INDEPENDENT_AMBULATORY_CARE_PROVIDER_SITE_OTHER): Payer: Medicaid Other | Admitting: Podiatrist

## 2013-11-08 ENCOUNTER — Encounter: Payer: Self-pay | Admitting: Podiatrist

## 2013-11-08 ENCOUNTER — Ambulatory Visit: Payer: Self-pay

## 2013-11-08 VITALS — BP 143/80 | HR 85 | Resp 16

## 2013-11-08 DIAGNOSIS — G5781 Other specified mononeuropathies of right lower limb: Secondary | ICD-10-CM

## 2013-11-08 DIAGNOSIS — M779 Enthesopathy, unspecified: Secondary | ICD-10-CM

## 2013-11-08 DIAGNOSIS — G5761 Lesion of plantar nerve, right lower limb: Secondary | ICD-10-CM

## 2013-11-08 NOTE — Patient Instructions (Signed)
Morton's Neuroma in Sports  (Interdigital Plantar Neuroma) Morton's neuroma is a condition of the nervous system that results in pain or loss of feeling in the toes. The disease is caused by the bones of the foot squeezing the nerve that runs between two toes (interdigital nerve). The third and fourth toes are most likely to be affected by this disease. SYMPTOMS   Tingling, numbness, burning, or electric shocks in the front of the foot, often involving the third and fourth toes, although it may involve any other pair of toes.  Pain and tenderness in the front of the foot, that gets worse when walking.  Pain that gets worse when pressure is applied to the foot (wearing shoes).  Severe pain in the front of the foot, when standing on the front of the foot (on tiptoes), such as with running, jumping, pivoting, or dancing. CAUSES  Morton's neuroma is caused by swelling of the nerve between two toes. This swelling causes the nerve to be pinched between the bones of the foot. RISK INCREASES WITH:  Recurring foot or ankle injuries.  Poor fitting or worn shoes, with minimal padding and shock absorbers.  Loose ligaments of the foot, causing thickening of the nerve.  Poor foot strength and flexibility. PREVENTION  Warm up and stretch properly before activity.  Maintain physical fitness:  Foot and ankle flexibility.  Muscle strength and endurance.  Cardiovascular fitness.  Wear properly fitted and padded shoes.  Wear arch supports (orthotics), when needed. PROGNOSIS  If treated properly, Morton's neuroma can usually be cured with non-surgical treatment. For certain cases, surgery may be needed. RELATED COMPLICATIONS  Permanent numbness and pain in the foot.  Inability to participate in athletics, because of pain. TREATMENT Treatment first involves stopping any activities that make the symptoms worse. The use of ice and medicine will help reduce pain and inflammation. Wearing shoes  with a wide toe box, and an orthotic arch support or metatarsal bar, may also reduce pain. Your caregiver may give you a corticosteroid injection, to further reduce inflammation. If non-surgical treatment is unsuccessful, surgery may be needed. Surgery to fix Morton's neuroma is often performed as an outpatient procedure, meaning you can go home the same day as the surgery. The procedure involves removing the source of pressure on the nerve. If it is necessary to remove the nerve, you can expect persistent numbness. MEDICATION  If pain medicine is needed, nonsteroidal anti-inflammatory medicines (aspirin and ibuprofen), or other minor pain relievers (acetaminophen), are often advised.  Do not take pain medicine for 7 days before surgery.  Prescription pain relievers are usually prescribed only after surgery. Use only as directed and only as much as you need.  Corticosteroid injections are used in extreme cases, to reduce inflammation. These injections should be done only if necessary, because they may be given only a limited number of times. HEAT AND COLD  Cold treatment (icing) should be applied for 10 to 15 minutes every 2 to 3 hours for inflammation and pain, and immediately after activity that aggravates your symptoms. Use ice packs or an ice massage.  Heat treatment may be used before performing stretching and strengthening activities prescribed by your caregiver, physical therapist, or athletic trainer. Use a heat pack or a warm water soak. SEEK MEDICAL CARE IF:   Symptoms get worse or do not improve in 2 weeks, despite treatment.  After surgery you develop increasing pain, swelling, redness, increased warmth, bleeding, drainage of fluids, or fever.  New, unexplained symptoms develop. (  Drugs used in treatment may produce side effects.) Document Released: 10/27/2004 Document Revised: 03/14/2011 Document Reviewed: 04/03/2008 [facility] Patient Information 2015 [place], [place]. This  information is not intended to replace advice given to you by your health care provider. Make sure you discuss any questions you have with your health care provider.

## 2013-11-08 NOTE — Progress Notes (Signed)
   Subjective:    Patient ID: Brittney [name], female    DOB: Jun 27, 1970, 43 y.o.   MRN: [id number]  HPI Comments: "I have pain on top"  Patient c/o aching forefoot right for several months. No injury. The area is swollen. She saw PCP in the spring and he xrayed and put in boot for 10 days. Walking doesn't bother it but just standing in one spot hurts. No other treatment tried. Takes Tylenol for general aches.  Foot Pain Associated symptoms include fatigue, headaches, nausea and numbness.      Review of Systems  Constitutional: Positive for fatigue.  HENT: Positive for sneezing.   Respiratory: Positive for wheezing.   Gastrointestinal: Positive for nausea.  Musculoskeletal: Positive for back pain and gait problem.  Allergic/Immunologic: Positive for environmental allergies and food allergies.  Neurological: Positive for numbness and headaches.  All other systems reviewed and are negative.      Objective:   Physical Exam  Neurovascular status is intact with palpable pedal pulses. Neuroma type symptomatology is elicited third interspace of the right foot. Palpable click is noted. This is the area of symptomatology elicited subjectively. musculoskeletal skeletal examination reveals good muscle, strength, tone and stability right foot.      Assessment & Plan:  Neuroma right foot  Plan: Injected the neuroma with Kenalog and Marcaine mixture as a diagnostic and therapeutic tool. If it is not beneficial she will call otherwise she'll be seen back for recheck as needed.

## 2013-11-14 ENCOUNTER — Encounter: Payer: Self-pay | Admitting: Podiatrist

## 2013-12-25 ENCOUNTER — Encounter (INDEPENDENT_AMBULATORY_CARE_PROVIDER_SITE_OTHER): Payer: Self-pay

## 2014-01-03 HISTORY — PX: BREAST BIOPSY: SHX20

## 2014-03-11 ENCOUNTER — Encounter (INDEPENDENT_AMBULATORY_CARE_PROVIDER_SITE_OTHER): Payer: Self-pay | Admitting: Internal Medicine

## 2014-03-11 ENCOUNTER — Ambulatory Visit (INDEPENDENT_AMBULATORY_CARE_PROVIDER_SITE_OTHER): Payer: Medicaid Other | Admitting: Internal Medicine

## 2014-03-11 VITALS — BP 140/72 | HR 72 | Temp 98.0°F | Ht 63.0 in | Wt 191.1 lb

## 2014-03-11 DIAGNOSIS — K5120 Ulcerative (chronic) proctitis without complications: Secondary | ICD-10-CM

## 2014-03-11 DIAGNOSIS — K3184 Gastroparesis: Secondary | ICD-10-CM

## 2014-03-11 NOTE — Progress Notes (Signed)
Subjective:    Patient ID: Brittney [name], female    DOB: [birth year], 44 y.o.   MRN: [id number]  HPI Here today for f/u of her distal  ulcerative colitis. Last seen in August 2015. Diagnosed with UC in 1997.  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 She tells me she is doing okay. She is having a BM about 1-2 times a weeks.  Stools are not hard. Stools are soft. No melena or BRRB. She is still doing Miralax. No rectal pain.  Appetite is good for the most part. No unintentional weight loss. She eats 3 meals a day (not large) She has started exercising.  Using a fitness DVD.  She wants to start walking.       10/09/2013 Colonoscopy: Surveillance of UC The examined terminal ileum appeared to be norma. Small abnormal mucosa was found in the sigmoid colon. The mucosa was erythematous and had granularity. Multiple biopsies were performed. Diffuse abnormal mucosa was found in the rectum. The mucosa was edematous, erythematous and had erosions, multiple biopsies of the area were performed. Small hypertrophic anal papilla in the anal canal.  Biopsy reveals mild inflammation but no dysplasia. Since patient is asymptomatic will hold off topical therapy and continue with oral mesalamine.   CBC    Component Value Date/Time   WBC 5.2 04/16/2013 1041   RBC 4.22 04/16/2013 1041   HGB 10.1* 04/16/2013 1041   HCT 31.7* 04/16/2013 1041   PLT 225 04/16/2013 1041   MCV 75.1* 04/16/2013 1041   MCH 23.9* 04/16/2013 1041   MCHC 31.9 04/16/2013 1041   RDW 15.9* 04/16/2013 1041   LYMPHSABS 2.1 07/04/2011 1615   MONOABS 0.4 07/04/2011 1615   EOSABS 0.0 07/04/2011 1615   BASOSABS 0.0 07/04/2011 1615  CRP normal.     04/09/2013 Gastric emptying study:  Expected location of the stomach in the left upper quadrant.  Ingested meal empties the stomach gradually over the course of the  study with 69% retention at 60 min and 60% retention at 120 min  (normal retention less than 30% at  a 120 min).  IMPRESSION:  Delayed gastric emptying.   She has had 3 flexible sigmoidoscopies in the past but has never undergone a colonoscopy. 07/27/2011 Flexible Sigmoid: UC, flatus, change in bowel movements. Dr. Laural Golden. Impression:  Active distal ulcerative colitis with transition at 30 cm from anal margin.          Review of Systems Past Medical History  Diagnosis Date  . 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)   . Diabetes mellitus     Type 1 over 15 yrs  . Environmental allergies   . GERD (gastroesophageal reflux disease)     Past Surgical History  Procedure Laterality Date  . Cesarean section    . Dilation and curettage of uterus      x 2 for miscarriages  . Flexible sigmoidoscopy    . Esophagogastroduodenoscopy    . Flexible sigmoidoscopy  07/27/2011    Procedure: FLEXIBLE SIGMOIDOSCOPY;  Surgeon: Rogene Houston, MD;  Location: [facility];  Service: Endoscopy;  Laterality: N/A;  100  . Colonoscopy N/A 10/09/2013    Procedure: COLONOSCOPY;  Surgeon: Rogene Houston, MD;  Location: [facility];  Service: Endoscopy;  Laterality: N/A;  100    Allergies  Allergen Reactions  . Statins Itching  . Peanuts [Peanut Oil] Itching  . Shellfish Allergy Itching    Current Outpatient Prescriptions on File Prior to Visit  Medication  Sig Dispense Refill  . acetaminophen (TYLENOL) 500 MG tablet Take 1,000 mg by mouth every 6 (six) hours as needed. For pain    . acyclovir (ZOVIRAX) 200 MG capsule Take 200 mg by mouth daily.     [name] EPINEPHrine (EPIPEN) 0.3 mg/0.3 mL SOAJ injection Inject into the muscle as needed.    . fexofenadine (ALLEGRA) 180 MG tablet Take 180 mg by mouth daily.    [name] gabapentin (NEURONTIN) 100 MG capsule Take 300 mg by mouth 3 (three) times daily.     . hydrOXYzine (ATARAX/VISTARIL) 10 MG tablet Take 25 mg by mouth at bedtime.     . insulin glargine (LANTUS) 100 UNIT/ML injection Inject 24-26 Units into the skin at bedtime.     . insulin  lispro protamine-insulin lispro (HUMALOG 50/50) (50-50) 100 UNIT/ML SUSP Inject 4-6 Units into the skin 4 (four) times daily.     . Melatonin 1 MG TABS Take 2 mg by mouth at bedtime.    . mesalamine (APRISO) 0.375 G 24 hr capsule Take 1 capsule (0.375 g total) by mouth daily. Patient states that she takes the 4 capsules in the morning 120 capsule 3  . mometasone (NASONEX) 50 MCG/ACT nasal spray Place 2 sprays into the nose daily.    . Olopatadine HCl (PATADAY) 0.2 % SOLN Apply 1 drop to eye as needed.     [name] omeprazole (PRILOSEC) 20 MG capsule Take 20 mg by mouth 2 (two) times daily before a meal.     . promethazine (PHENERGAN) 25 MG tablet Take 25 mg by mouth every 8 (eight) hours as needed for nausea or vomiting.    . naproxen sodium (ANAPROX) 220 MG tablet Take 220 mg by mouth daily as needed (pain).     No current facility-administered medications on file prior to visit.        Objective:   Physical Exam Blood pressure 140/72, pulse 72, temperature 98 F (36.7 C), height 5' 3"  (1.6 m), weight 191 lb 1.6 oz (86.682 kg).  Alert and oriented. Skin warm and dry. Oral mucosa is moist.   . Sclera anicteric, conjunctivae is pink. Thyroid not enlarged. No cervical lymphadenopathy. Lungs clear. Heart regular rate and rhythm.  Abdomen is soft. Bowel sounds are positive. No hepatomegaly. No abdominal masses felt. No tenderness.  No edema to lower extremities.         Assessment & Plan:  Gastroparesis. She continues to have some constipation. Advised to exercise and continue the Miralax. UC. She seems to be in remission. Will get a CBC and CRP on her today.

## 2014-03-11 NOTE — Patient Instructions (Signed)
CBC, CRP. OV in 6 months.

## 2014-03-12 LAB — CBC WITH DIFFERENTIAL/PLATELET
Basophils Absolute: 0 10*3/uL (ref 0.0–0.1)
Basophils Relative: 0 % (ref 0–1)
EOS ABS: 0 10*3/uL (ref 0.0–0.7)
EOS PCT: 0 % (ref 0–5)
HCT: 33.2 % — ABNORMAL LOW (ref 36.0–46.0)
HEMOGLOBIN: 10.2 g/dL — AB (ref 12.0–15.0)
Lymphocytes Relative: 37 % (ref 12–46)
Lymphs Abs: 2.1 10*3/uL (ref 0.7–4.0)
MCH: 24.5 pg — ABNORMAL LOW (ref 26.0–34.0)
MCHC: 30.7 g/dL (ref 30.0–36.0)
MCV: 79.8 fL (ref 78.0–100.0)
MONOS PCT: 7 % (ref 3–12)
MPV: 9.9 fL (ref 8.6–12.4)
Monocytes Absolute: 0.4 10*3/uL (ref 0.1–1.0)
Neutro Abs: 3.1 10*3/uL (ref 1.7–7.7)
Neutrophils Relative %: 56 % (ref 43–77)
Platelets: 287 10*3/uL (ref 150–400)
RBC: 4.16 MIL/uL (ref 3.87–5.11)
RDW: 16.8 % — AB (ref 11.5–15.5)
WBC: 5.6 10*3/uL (ref 4.0–10.5)

## 2014-03-12 LAB — C-REACTIVE PROTEIN: CRP: <0.5 mg/dL (ref <0.60)

## 2014-04-21 ENCOUNTER — Telehealth (INDEPENDENT_AMBULATORY_CARE_PROVIDER_SITE_OTHER): Payer: Self-pay | Admitting: *Deleted

## 2014-04-21 NOTE — Telephone Encounter (Signed)
Dr.Rehman -advise what the patient may be treated with.

## 2014-04-21 NOTE — Telephone Encounter (Signed)
Will treat with mesalamine enemas 1 per rectum daily at bedtime for 4 weeks. Please call patient and prescription

## 2014-04-21 NOTE — Telephone Encounter (Signed)
Rx was called to [facility] as noted below per Dr.Rehman. Patient was made aware.

## 2014-04-21 NOTE — Telephone Encounter (Signed)
Brittney [name] feels like she is having a flare up of Ulcerative Colitis. Symptoms are passing gas, the feeling of having to go to the bathroom but only mucus. Her return phone number is [phone].

## 2014-09-16 ENCOUNTER — Ambulatory Visit (INDEPENDENT_AMBULATORY_CARE_PROVIDER_SITE_OTHER): Payer: Medicaid Other | Admitting: Internal Medicine

## 2014-09-16 ENCOUNTER — Encounter (INDEPENDENT_AMBULATORY_CARE_PROVIDER_SITE_OTHER): Payer: Self-pay | Admitting: Internal Medicine

## 2014-09-16 VITALS — BP 118/70 | HR 74 | Temp 98.4°F | Resp 18 | Ht 63.0 in | Wt 180.4 lb

## 2014-09-16 DIAGNOSIS — K5190 Ulcerative colitis, unspecified, without complications: Secondary | ICD-10-CM

## 2014-09-16 DIAGNOSIS — K5900 Constipation, unspecified: Secondary | ICD-10-CM

## 2014-09-16 MED ORDER — POLYETHYLENE GLYCOL 3350 17 GM/SCOOP PO POWD
8.5000 g | Freq: Every day | ORAL | Status: DC
Start: 2014-09-16 — End: 2018-03-21

## 2014-09-16 NOTE — Progress Notes (Signed)
Presenting complaint;  Follow-up for ulcerative colitis and constipation.  Subjective:  Brittney [name] is 44 year old African-American female who is here for scheduled visit. She was last seen 6 months ago. She had flexible sigmoidoscopy in July 2013 revealing active disease to distal sigmoid colon and rectum. She had colonoscopy in October 2015 revealing rectal and patchy sigmoid colon disease. She states she is doing well. She is not passing blood per rectum or having diarrhea. Instead she is constipated. She has gone as many as 5 days without a bowel movement. She may have 1-2 bowel movements each week. She feels bloated. She had Pap smear by Dr. Barrie Dunker last week and was negative. She has lost 10 pounds this or last visit. She is trying hard to lose some more. She is going to school she is also working 9 hours each week. She says she has never used EpiPen and takes Atarax for hives and itching. She states she takes Advil and/or Aleve occasionally. She states she had blood work recently by [name].   Current Medications: Outpatient Encounter Prescriptions as of 09/16/2014  Medication Sig  . acetaminophen (TYLENOL) 500 MG tablet Take 1,000 mg by mouth every 6 (six) hours as needed. For pain  . acyclovir (ZOVIRAX) 200 MG capsule Take 200 mg by mouth daily.   [name] EPINEPHrine (EPIPEN) 0.3 mg/0.3 mL SOAJ injection Inject into the muscle as needed.  . gabapentin (NEURONTIN) 100 MG capsule Take 100 mg by mouth 2 (two) times daily.   . hydrOXYzine (ATARAX/VISTARIL) 10 MG tablet Take 25 mg by mouth at bedtime.   . insulin glargine (LANTUS) 100 UNIT/ML injection Inject 20-24 Units into the skin at bedtime.   . insulin lispro protamine-insulin lispro (HUMALOG 50/50) (50-50) 100 UNIT/ML SUSP Inject 4-6 Units into the skin 4 (four) times daily.   [name] loratadine (CLARITIN) 10 MG tablet TAKE ONE TABLET BY MOUTH DAILY FOR HIVES.  [name] Melatonin 5 MG TABS Take 5 mg by mouth at bedtime.  . mesalamine (APRISO)  0.375 G 24 hr capsule Take 1 capsule (0.375 g total) by mouth daily. Patient states that she takes the 4 capsules in the morning  . mometasone (NASONEX) 50 MCG/ACT nasal spray Place 2 sprays into the nose daily.  . Olopatadine HCl (PATADAY) 0.2 % SOLN Apply 1 drop to eye as needed.   [name] omeprazole (PRILOSEC) 20 MG capsule Take 20 mg by mouth 2 (two) times daily before a meal.   . PROAIR HFA 108 (90 BASE) MCG/ACT inhaler INHALE 1-2 PUFFS EVERY 4 TO 6 HOURS. PRN  . promethazine (PHENERGAN) 25 MG tablet Take 25 mg by mouth every 8 (eight) hours as needed for nausea or vomiting.  . [DISCONTINUED] fexofenadine (ALLEGRA) 180 MG tablet Take 180 mg by mouth daily.  . [DISCONTINUED] Melatonin 1 MG TABS Take 2 mg by mouth at bedtime.  . [DISCONTINUED] naproxen sodium (ANAPROX) 220 MG tablet Take 220 mg by mouth daily as needed (pain).   No facility-administered encounter medications on file as of 09/16/2014.     Objective: Blood pressure 118/70, pulse 74, temperature 98.4 F (36.9 C), temperature source Oral, resp. rate 18, height 5' 3"  (1.6 m), weight 180 lb 6.4 oz (81.829 kg), last menstrual period 09/13/2014. Patient is alert and in no acute distress. Conjunctiva is pink. Sclera is nonicteric Oropharyngeal mucosa is normal. No neck masses or thyromegaly noted. Cardiac exam with regular rhythm normal S1 and S2. No murmur or gallop noted. Lungs are clear to auscultation. Abdomen is full but  soft and nontender without organomegaly or masses. No LE edema or clubbing noted.    Assessment:  #1. Ulcerative colitis remains in remission. Her disease has been difficult to control it is important that she stays on oral mesalamine indefinitely. #2. Chronic constipation. She needs to be taking polyethylene glycol on schedule rather than when necessary basis and she also needs to increase intake of fiber each foods.   Plan:  Requests copy of recent blood work from [name] office. Polyethylene  glycol 8.5 g by mouth daily or every other day. Continue Apriso at 1500 mg by mouth daily. High fiber diet. Patient advised to keep NSAID use to minimum. Office visit in 6 months.

## 2014-09-16 NOTE — Patient Instructions (Signed)
High fiber diet. Eating 1 applicator daily would also help. Can take polyethylene glycol half a scoop daily or every other day.

## 2014-12-01 ENCOUNTER — Other Ambulatory Visit: Payer: Self-pay | Admitting: Unknown Physician Specialty

## 2014-12-01 DIAGNOSIS — R921 Mammographic calcification found on diagnostic imaging of breast: Secondary | ICD-10-CM

## 2014-12-10 ENCOUNTER — Other Ambulatory Visit: Payer: Self-pay | Admitting: Unknown Physician Specialty

## 2014-12-10 ENCOUNTER — Ambulatory Visit
Admission: RE | Admit: 2014-12-10 | Discharge: 2014-12-10 | Disposition: A | Discharge status: Home / Self Care | Payer: Medicaid Other | Source: Ambulatory Visit | Attending: Unknown Physician Specialty | Admitting: Unknown Physician Specialty

## 2014-12-10 DIAGNOSIS — R921 Mammographic calcification found on diagnostic imaging of breast: Secondary | ICD-10-CM

## 2015-01-04 HISTORY — PX: BREAST LUMPECTOMY: SHX2

## 2015-03-17 ENCOUNTER — Encounter (INDEPENDENT_AMBULATORY_CARE_PROVIDER_SITE_OTHER): Payer: Self-pay | Admitting: Internal Medicine

## 2015-03-17 ENCOUNTER — Ambulatory Visit (INDEPENDENT_AMBULATORY_CARE_PROVIDER_SITE_OTHER): Payer: Medicaid Other | Admitting: Internal Medicine

## 2015-03-17 VITALS — BP 170/90 | HR 72 | Temp 98.3°F | Ht 63.0 in | Wt 178.8 lb

## 2015-03-17 DIAGNOSIS — K5120 Ulcerative (chronic) proctitis without complications: Secondary | ICD-10-CM | POA: Diagnosis not present

## 2015-03-17 DIAGNOSIS — J209 Acute bronchitis, unspecified: Secondary | ICD-10-CM

## 2015-03-17 MED ORDER — LEVOFLOXACIN 500 MG PO TABS: 500.0000 mg | ORAL_TABLET | Freq: Every day | ORAL | Status: DC

## 2015-03-17 NOTE — Patient Instructions (Signed)
OV in 6 months.

## 2015-03-17 NOTE — Progress Notes (Signed)
Subjective:    Patient ID: Brittney [name], female    DOB: 02/16/1970, 45 y.o.   MRN: [id number]  HPI Here today for f/u of her UC. She was last seen in September by Dr. Laural Golden.  Flexible sigmoidoscopy in July 2013 revealing active disease to distal sigmoid colon and rectum.   She had colonoscopy in October 2015 revealing rectal and patchy sigmoid colon disease.  Recently treated for a cough with Zithromax.  She tells me she is doing good. She is having 1-2 stools every other day or every 3 days.  There has been no melena or BRRB. Appetite is okay.  No weight loss. She occasionally has abdominal pain. She occasionally has bouts of nausea.  She tells me a week or two before her period she becomes constipated.  Review of Systems Past Medical History  Diagnosis Date  . 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) ([place])   . Diabetes mellitus ([place])     Type 1 over 15 yrs  . Environmental allergies   . GERD (gastroesophageal reflux disease)     Past Surgical History  Procedure Laterality Date  . Cesarean section    . Dilation and curettage of uterus      x 2 for miscarriages  . Flexible sigmoidoscopy    . Esophagogastroduodenoscopy    . Flexible sigmoidoscopy  07/27/2011    Procedure: FLEXIBLE SIGMOIDOSCOPY;  Surgeon: Rogene Houston, MD;  Location: [facility];  Service: Endoscopy;  Laterality: N/A;  100  . Colonoscopy N/A 10/09/2013    Procedure: COLONOSCOPY;  Surgeon: Rogene Houston, MD;  Location: [facility];  Service: Endoscopy;  Laterality: N/A;  100    Allergies  Allergen Reactions  . Statins Itching  . Peanuts [Peanut Oil] Itching  . Shellfish Allergy Itching    Current Outpatient Prescriptions on File Prior to Visit  Medication Sig Dispense Refill  . acetaminophen (TYLENOL) 500 MG tablet Take 1,000 mg by mouth every 6 (six) hours as needed. For pain    . acyclovir (ZOVIRAX) 200 MG capsule Take 200 mg by mouth daily.     [name] EPINEPHrine (EPIPEN) 0.3 mg/0.3 mL  SOAJ injection Inject into the muscle as needed.    . gabapentin (NEURONTIN) 100 MG capsule Take 100 mg by mouth 3 (three) times daily.     . insulin glargine (LANTUS) 100 UNIT/ML injection Inject 18 Units into the skin at bedtime.     . insulin lispro protamine-insulin lispro (HUMALOG 50/50) (50-50) 100 UNIT/ML SUSP Inject 6 Units into the skin 4 (four) times daily.     [name] loratadine (CLARITIN) 10 MG tablet TAKE ONE TABLET BY MOUTH DAILY FOR HIVES.  6  . Melatonin 5 MG TABS Take 5 mg by mouth at bedtime.    . mesalamine (APRISO) 0.375 G 24 hr capsule Take 1 capsule (0.375 g total) by mouth daily. Patient states that she takes the 4 capsules in the morning 120 capsule 3  . mometasone (NASONEX) 50 MCG/ACT nasal spray Place 2 sprays into the nose daily.    [name] omeprazole (PRILOSEC) 20 MG capsule Take 20 mg by mouth 2 (two) times daily before a meal.     . polyethylene glycol powder (GLYCOLAX/MIRALAX) powder Take 8.5 g by mouth daily. 255 g 5  . PROAIR HFA 108 (90 BASE) MCG/ACT inhaler INHALE 1-2 PUFFS EVERY 4 TO 6 HOURS. PRN  1   No current facility-administered medications on file prior to visit.  Objective:   Physical Exam Blood pressure 170/90, pulse 72, temperature 98.3 F (36.8 C), height 5' 3"  (1.6 m), weight 178 lb 12.8 oz (81.103 kg). Alert and oriented. Skin warm and dry. Oral mucosa is moist.   . Sclera anicteric, conjunctivae is pink. Thyroid not enlarged. No cervical lymphadenopathy.Bilateral wheezes.  Heart regular rate and rhythm.  Abdomen is soft. Bowel sounds are positive. No hepatomegaly. No abdominal masses felt. No tenderness.  No edema to lower extremities.           Assessment & Plan:  UC. She seems to be doing well. She will continue the Apriso. OV in 6 months.  Will get a CBC and CRP today. Bronchitis: She has bilateral wheezes on exam today. She completed Z-pack. Am going to cover her with Levaquin 559m daily x 10 days.

## 2015-03-18 LAB — CBC WITH DIFFERENTIAL/PLATELET
Basophils Absolute: 0 10*3/uL (ref 0.0–0.1)
Basophils Relative: 0 % (ref 0–1)
Eosinophils Absolute: 0 10*3/uL (ref 0.0–0.7)
Eosinophils Relative: 0 % (ref 0–5)
HCT: 37.8 % (ref 36.0–46.0)
Hemoglobin: 11.8 g/dL — ABNORMAL LOW (ref 12.0–15.0)
LYMPHS ABS: 3 10*3/uL (ref 0.7–4.0)
LYMPHS PCT: 44 % (ref 12–46)
MCH: 25.2 pg — AB (ref 26.0–34.0)
MCHC: 31.2 g/dL (ref 30.0–36.0)
MCV: 80.8 fL (ref 78.0–100.0)
MONOS PCT: 9 % (ref 3–12)
MPV: 10.7 fL (ref 8.6–12.4)
Monocytes Absolute: 0.6 10*3/uL (ref 0.1–1.0)
NEUTROS PCT: 47 % (ref 43–77)
Neutro Abs: 3.2 10*3/uL (ref 1.7–7.7)
Platelets: 269 10*3/uL (ref 150–400)
RBC: 4.68 MIL/uL (ref 3.87–5.11)
RDW: 18.2 % — ABNORMAL HIGH (ref 11.5–15.5)
WBC: 6.9 10*3/uL (ref 4.0–10.5)

## 2015-03-18 LAB — C-REACTIVE PROTEIN: CRP: <0.5 mg/dL (ref <0.60)

## 2015-05-12 DIAGNOSIS — E119 Type 2 diabetes mellitus without complications: Secondary | ICD-10-CM | POA: Insufficient documentation

## 2015-05-12 DIAGNOSIS — H269 Unspecified cataract: Secondary | ICD-10-CM | POA: Insufficient documentation

## 2015-06-03 ENCOUNTER — Encounter: Payer: Self-pay | Admitting: Genetic Counselor

## 2015-06-11 ENCOUNTER — Encounter (HOSPITAL_COMMUNITY): Payer: Medicaid Other

## 2015-06-11 ENCOUNTER — Encounter (HOSPITAL_COMMUNITY): Payer: Self-pay | Admitting: Hematology & Oncology

## 2015-06-11 ENCOUNTER — Encounter (HOSPITAL_COMMUNITY): Payer: Medicaid Other | Attending: Hematology & Oncology | Admitting: Hematology & Oncology

## 2015-06-11 VITALS — BP 151/78 | HR 65 | Temp 98.9°F | Resp 20 | Ht 61.0 in | Wt 178.0 lb

## 2015-06-11 DIAGNOSIS — D509 Iron deficiency anemia, unspecified: Secondary | ICD-10-CM

## 2015-06-11 DIAGNOSIS — K5190 Ulcerative colitis, unspecified, without complications: Secondary | ICD-10-CM | POA: Diagnosis not present

## 2015-06-11 DIAGNOSIS — K219 Gastro-esophageal reflux disease without esophagitis: Secondary | ICD-10-CM | POA: Diagnosis not present

## 2015-06-11 DIAGNOSIS — C50411 Malignant neoplasm of upper-outer quadrant of right female breast: Secondary | ICD-10-CM

## 2015-06-11 DIAGNOSIS — E108 Type 1 diabetes mellitus with unspecified complications: Secondary | ICD-10-CM | POA: Insufficient documentation

## 2015-06-11 DIAGNOSIS — Z5111 Encounter for antineoplastic chemotherapy: Secondary | ICD-10-CM | POA: Insufficient documentation

## 2015-06-11 DIAGNOSIS — Z9889 Other specified postprocedural states: Secondary | ICD-10-CM | POA: Diagnosis not present

## 2015-06-11 DIAGNOSIS — D500 Iron deficiency anemia secondary to blood loss (chronic): Secondary | ICD-10-CM | POA: Insufficient documentation

## 2015-06-11 DIAGNOSIS — Z808 Family history of malignant neoplasm of other organs or systems: Secondary | ICD-10-CM | POA: Insufficient documentation

## 2015-06-11 HISTORY — DX: Malignant neoplasm of upper-outer quadrant of right female breast: C50.411

## 2015-06-11 LAB — CBC WITH DIFFERENTIAL/PLATELET
BASOS PCT: 0 %
Basophils Absolute: 0 10*3/uL (ref 0.0–0.1)
EOS ABS: 0 10*3/uL (ref 0.0–0.7)
EOS PCT: 0 %
HEMATOCRIT: 31.8 % — AB (ref 36.0–46.0)
Hemoglobin: 10.5 g/dL — ABNORMAL LOW (ref 12.0–15.0)
Lymphocytes Relative: 34 %
Lymphs Abs: 1.7 10*3/uL (ref 0.7–4.0)
MCH: 27.6 pg (ref 26.0–34.0)
MCHC: 33 g/dL (ref 30.0–36.0)
MCV: 83.5 fL (ref 78.0–100.0)
MONO ABS: 0.3 10*3/uL (ref 0.1–1.0)
MONOS PCT: 5 %
NEUTROS ABS: 3.1 10*3/uL (ref 1.7–7.7)
Neutrophils Relative %: 61 %
Platelets: 321 10*3/uL (ref 150–400)
RBC: 3.81 MIL/uL — ABNORMAL LOW (ref 3.87–5.11)
RDW: 14.9 % (ref 11.5–15.5)
WBC: 5.1 10*3/uL (ref 4.0–10.5)

## 2015-06-11 LAB — COMPREHENSIVE METABOLIC PANEL
ALBUMIN: 3.5 g/dL (ref 3.5–5.0)
ALT: 18 U/L (ref 14–54)
ANION GAP: 6 (ref 5–15)
AST: 21 U/L (ref 15–41)
Alkaline Phosphatase: 73 U/L (ref 38–126)
BILIRUBIN TOTAL: 0.4 mg/dL (ref 0.3–1.2)
BUN: 6 mg/dL (ref 6–20)
CO2: 27 mmol/L (ref 22–32)
Calcium: 8.9 mg/dL (ref 8.9–10.3)
Chloride: 103 mmol/L (ref 101–111)
Creatinine, Ser: 0.48 mg/dL (ref 0.44–1.00)
GFR calc Af Amer: >60 mL/min (ref >60)
GFR calc non Af Amer: >60 mL/min (ref >60)
GLUCOSE: 230 mg/dL — AB (ref 65–99)
POTASSIUM: 4 mmol/L (ref 3.5–5.1)
Sodium: 136 mmol/L (ref 135–145)
TOTAL PROTEIN: 6.7 g/dL (ref 6.5–8.1)

## 2015-06-11 LAB — IRON AND TIBC
Iron: 46 ug/dL (ref 28–170)
SATURATION RATIOS: 11 % (ref 10.4–31.8)
TIBC: 403 ug/dL (ref 250–450)
UIBC: 357 ug/dL

## 2015-06-11 LAB — FERRITIN: FERRITIN: 13 ng/mL (ref 11–307)

## 2015-06-11 NOTE — Progress Notes (Signed)
[name], MD as PCP - General (Unknown Physician Specialty)  CHIEF COMPLAINTS/PURPOSE OF CONSULTATION:     Breast cancer of upper-outer quadrant of right female breast ([place])   04/29/2015 Initial Biopsy upper outer quadrant mass R breast 2.9 cm by ultrasound, high grade invasive ductal carcinoma ER< 1%, PR 1%, HER -2 not amplified, Ki-67 66%   05/13/2015 Cancer Staging pT1cN0   05/13/2015 Surgery lumpectomy and sentinel node biopsies X 3, 2 cm tumor, 3 negative nodes    05/21/2015 Imaging CT [facility] with postsurgical changes related to R breast lympectomy and R axillary LN dissection, no findings suspicious for metastatic disease     HISTORY OF PRESENTING ILLNESS:  Brittney [name] 45 y.o. 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  She has had a radiation oncology consult with Dr. Pablo Ledger and is here today to discuss chemotherapy.  The patient saw Dr. Tressie Stalker in consultation at [facility]. She had follow-up on 06/02/2015 where chemotherapy was addressed. The patient notes she expressed concern about the drug "taxotere" given commercials about permanent hair loss. Per Dr. Jaclyn Prime records:  Her biggest concern about chemotherapy is permanent loss of hair. She has fairly thin hair and a significant receding hairline already. I think she is certainly a candidate for incomplete hair regeneration. Nevertheless she clearly needs chemotherapy for her high grade breast cancer Her biggest concern is permanent hair loss. She has somewhat thin hair anyway. She of course has this immune disorder for the past consistent with ulcerative colitis times many years.  We went over the gold standard regimen which is Adriamycin/Cytoxan followed by Taxol in a dose dense fashion. The other option might be FEC 6 cycles in a dose dense fashion and the third  possibility would be potentially carboplatin and Taxotere dose dense to every 3 weeks for 6 cycles.  I suspect the second and third possibilities have the least amount of significant hair loss. With her immune disorder however I am not certain that either regimen would be perfect for regrowth of her hair.   She sees Dr. Laural Golden for GI, regarding her ulcerative colitis. She has had this since October of 1997, and confirms having had several colonoscopies. Her family doctor currently manages her diabetes, [name].   Regarding her diagnosis, she reports that she gets her mammograms every year. In terms of discovering her cancer, she notes "I could have felt something sooner than what I did, but I want to say it was in March. It was already close to time for my menstrual cycle." She says she thought it was just one of those little hard places that will go away. Her period came, went, and the lump was still there. She notes also having some emotional problems at the time too, which she went to the doctor for. She mentioned the lump at the time and was sent for a mammogram of her breast. They biopsied after the mammogram and she was called back to the PA that sent her for the scans. That's when she was given the news. She notes she tries not to think about it, but it's still somewhat of a shock, because you "don't think it's going to happen to you, but it is." She went to genetics counseling in [place] already. She has not been called back with the results of this yet.  Dr. Elta Guadeloupe  DeMason did her surgery, and will be putting her port in tomorrow (June 12 2015). She has never seen a port and the nature of the port was reviewed with her during her appointment.  She is mostly worried about discussing her illness with her daughter. She says they really haven't sat down and talked about it, but her daughter keeps asking her what's wrong, and she wonders how many more times her daughter will have to ask before she  can explain the truth to her. She is especially having issues with the father of her daughter, who wants to shelter her from the truth as opposed to educate her about what is really going on.  During the physical exam, she notes that she's had a problem with her R axillary incision draining. This began happening over the weekend. She has seen Dr. Anthony Sar for this.  She still has periods and notes that she's had problems in the past with anemia. The last time she had her bloodwork done was May 10th at [place]. She also confirms that she's had problems with low iron in the past. She notes that she currently has an IUD in place.   MEDICAL HISTORY:  Past Medical History  Diagnosis Date  . 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) ([place])   . Diabetes mellitus ([place])     Type 1 over 15 yrs  . Environmental allergies   . GERD (gastroesophageal reflux disease)     SURGICAL HISTORY: Past Surgical History  Procedure Laterality Date  . Cesarean section    . Dilation and curettage of uterus      x 2 for miscarriages  . Flexible sigmoidoscopy    . Esophagogastroduodenoscopy    . Flexible sigmoidoscopy  07/27/2011    Procedure: FLEXIBLE SIGMOIDOSCOPY;  Surgeon: Rogene Houston, MD;  Location: [facility];  Service: Endoscopy;  Laterality: N/A;  100  . Colonoscopy N/A 10/09/2013    Procedure: COLONOSCOPY;  Surgeon: Rogene Houston, MD;  Location: [facility];  Service: Endoscopy;  Laterality: N/A;  100    SOCIAL HISTORY: Social History   Social History  . Marital Status: Single    Spouse Name: N/A  . Number of Children: N/A  . Years of Education: N/A   Occupational History  . Not on file.   Social History Main Topics  . Smoking status: Never Smoker   . Smokeless tobacco: Never Used  . Alcohol Use: No  . Drug Use: No  . Sexual Activity: Not on file   Other Topics Concern  . Not on file   Social History Narrative   She is single. Has a 42 year old daughter. Aged 34. Works in  home health care. Also going to school. Has to sit out her very last term in order to do her chemotherapy. Working toward RNA/CNA.  She does not smoke.  She loves to read, spend time with her family. She used to like to shop but doesnm't have a lot of money to spend now, so it's at the bottom of the list.   FAMILY HISTORY: Family History  Problem Relation Age of Onset  . Colon cancer Cousin    Mom diagnosed in 2013 with non-hodgkin's lymphoma. Had chemotherapy, radiation, more chemotherapy, and stem-cell at [facility] using her cells. Her mother has been in remission since. Mother is aged 24 this month (June 20th, 2017). Father is 33 as well; has high blood pressure. PTSD. Has been treated at the [facility] in [place]. She  believes he has high cholesterol too, like her mother. Otherwise, he is "alright I guess."  Has an older brother, and a younger sister. No one else in her immediate family with cancer.   ALLERGIES:  is allergic to ace inhibitors; pravastatin sodium; statins; peanuts; and shellfish allergy.  MEDICATIONS:  Current Outpatient Prescriptions  Medication Sig Dispense Refill  . acetaminophen (TYLENOL) 500 MG tablet Take 1,000 mg by mouth every 6 (six) hours as needed. For pain    . acyclovir (ZOVIRAX) 200 MG capsule Take 200 mg by mouth daily.     [name] albuterol (PROVENTIL HFA;VENTOLIN HFA) 108 (90 Base) MCG/ACT inhaler Inhale into the lungs every 6 (six) hours as needed for wheezing or shortness of breath.    . EPINEPHrine (EPIPEN) 0.3 mg/0.3 mL SOAJ injection Inject into the muscle as needed.    . gabapentin (NEURONTIN) 100 MG capsule Take 100 mg by mouth 3 (three) times daily.     . insulin glargine (LANTUS) 100 UNIT/ML injection Inject 20 Units into the skin at bedtime.     . insulin lispro protamine-insulin lispro (HUMALOG 50/50) (50-50) 100 UNIT/ML SUSP Inject 4 Units into the skin 4 (four) times daily. With each meal    . iron polysaccharides (NIFEREX) 150 MG  capsule Take 150 mg by mouth daily.    [name] loratadine (CLARITIN) 10 MG tablet TAKE ONE TABLET BY MOUTH DAILY FOR HIVES.  6  . Melatonin 5 MG TABS Take 5 mg by mouth at bedtime.    . mesalamine (APRISO) 0.375 G 24 hr capsule Take 1 capsule (0.375 g total) by mouth daily. Patient states that she takes the 4 capsules in the morning 120 capsule 3  . mometasone (NASONEX) 50 MCG/ACT nasal spray Place 2 sprays into the nose daily.    [name] omeprazole (PRILOSEC) 20 MG capsule Take 20 mg by mouth 2 (two) times daily before a meal.     . ondansetron (ZOFRAN-ODT) 8 MG disintegrating tablet Take 8 mg by mouth every 8 (eight) hours as needed for nausea or vomiting.    . polyethylene glycol powder (GLYCOLAX/MIRALAX) powder Take 8.5 g by mouth daily. 255 g 5   No current facility-administered medications for this visit.    Review of Systems  Constitutional: Negative.  Negative for fever, chills, weight loss and malaise/fatigue.  HENT: Negative.  Negative for congestion, hearing loss, nosebleeds, sore throat and tinnitus.   Eyes: Negative.  Negative for blurred vision, double vision, pain and discharge.  Respiratory: Negative.  Negative for cough, hemoptysis, sputum production, shortness of breath and wheezing.   Cardiovascular: Negative.  Negative for chest pain, palpitations, claudication, leg swelling and PND.  Gastrointestinal: Negative.  Negative for heartburn, nausea, vomiting, abdominal pain, diarrhea, constipation, blood in stool and melena.  Genitourinary: Negative.  Negative for dysuria, urgency, frequency and hematuria.  Musculoskeletal: Negative.  Negative for myalgias, joint pain and falls.  Skin: Negative.  Negative for itching and rash.  Neurological: Negative.  Negative for dizziness, tingling, tremors, sensory change, speech change, focal weakness, seizures, loss of consciousness, weakness and headaches.  Endo/Heme/Allergies: Negative.  Does not bruise/bleed easily.  Psychiatric/Behavioral:  Negative.  Negative for depression, suicidal ideas, memory loss and substance abuse. The patient is not nervous/anxious and does not have insomnia.   All other systems reviewed and are negative.  14 point ROS was done and is otherwise as detailed above or in HPI   PHYSICAL EXAMINATION: ECOG PERFORMANCE STATUS: 0 - Asymptomatic  Filed Vitals:   06/11/15 1339  BP: 151/78  Pulse: 65  Temp: 98.9 F (37.2 C)  Resp: 20   Filed Weights   06/11/15 1339  Weight: 178 lb (80.74 kg)    Physical Exam  Constitutional: She is oriented to person, place, and time and well-developed, well-nourished, and in no distress.  HENT:  Head: Normocephalic and atraumatic.  Nose: Nose normal.  Mouth/Throat: Oropharynx is clear and moist. No oropharyngeal exudate.  Eyes: Conjunctivae and EOM are normal. Pupils are equal, round, and reactive to light. Right eye exhibits no discharge. Left eye exhibits no discharge. No scleral icterus.  Neck: Normal range of motion. Neck supple. No tracheal deviation present. No thyromegaly present.  Cardiovascular: Normal rate, regular rhythm and normal heart sounds.  Exam reveals no gallop and no friction rub.   No murmur heard. Pulmonary/Chest: Effort normal and breath sounds normal. She has no wheezes. She has no rales.  R axillary incision with clear serosanguinous drainage. No obvious infection.   Abdominal: Soft. Bowel sounds are normal. She exhibits no distension and no mass. There is no tenderness. There is no rebound and no guarding.  Musculoskeletal: Normal range of motion. She exhibits no edema.  Lymphadenopathy:    She has no cervical adenopathy.  Neurological: She is alert and oriented to person, place, and time. She has normal reflexes. No cranial nerve deficit. Gait normal. Coordination normal.  Skin: Skin is warm and dry. No rash noted.  Psychiatric: Mood, memory, affect and judgment normal.  Nursing note and vitals reviewed. R breast with well healing R  upper outer quadrant surgical incision, R axillary incision as previously noted.    LABORATORY DATA:  Results for KAYLENA, PACIFICO (MRN [id number]) as of 06/12/2015 12:46  Ref. Range 06/11/2015 14:36  Sodium Latest Ref Range: 135-145 mmol/L 136  Potassium Latest Ref Range: 3.5-5.1 mmol/L 4.0  Chloride Latest Ref Range: 101-111 mmol/L 103  CO2 Latest Ref Range: 22-32 mmol/L 27  BUN Latest Ref Range: 6-20 mg/dL 6  Creatinine Latest Ref Range: 0.44-1.00 mg/dL 0.48  Calcium Latest Ref Range: 8.9-10.3 mg/dL 8.9  EGFR (Non-African Amer.) Latest Ref Range: >60 mL/min >60  EGFR (African American) Latest Ref Range: >60 mL/min >60  Glucose Latest Ref Range: 65-99 mg/dL 230 (H)  Anion gap Latest Ref Range: 5-15  6  Alkaline Phosphatase Latest Ref Range: 38-126 U/L 73  Albumin Latest Ref Range: 3.5-5.0 g/dL 3.5  AST Latest Ref Range: 15-41 U/L 21  ALT Latest Ref Range: 14-54 U/L 18  Total Protein Latest Ref Range: 6.5-8.1 g/dL 6.7  Total Bilirubin Latest Ref Range: 0.3-1.2 mg/dL 0.4  Iron Latest Ref Range: 28-170 ug/dL 46  UIBC Latest Units: ug/dL 357  TIBC Latest Ref Range: 250-450 ug/dL 403  Saturation Ratios Latest Ref Range: 10.4-31.8 % 11  Ferritin Latest Ref Range: 11-307 ng/mL 13  WBC Latest Ref Range: 4.0-10.5 K/uL 5.1  RBC Latest Ref Range: 3.87-5.11 MIL/uL 3.81 (L)  Hemoglobin Latest Ref Range: 12.0-15.0 g/dL 10.5 (L)  HCT Latest Ref Range: 36.0-46.0 % 31.8 (L)  MCV Latest Ref Range: 78.0-100.0 fL 83.5  MCH Latest Ref Range: 26.0-34.0 pg 27.6  MCHC Latest Ref Range: 30.0-36.0 g/dL 33.0  RDW Latest Ref Range: 11.5-15.5 % 14.9  Platelets Latest Ref Range: 150-400 K/uL 321  Neutrophils Latest Units: % 61  Lymphocytes Latest Units: % 34  Monocytes Relative Latest Units: % 5  Eosinophil Latest Units: % 0  Basophil Latest Units: % 0  NEUT# Latest Ref Range: 1.7-7.7 K/uL 3.1  Lymphocyte # Latest  Ref Range: 0.7-4.0 K/uL 1.7  Monocyte # Latest Ref Range: 0.1-1.0 K/uL 0.3    Eosinophils Absolute Latest Ref Range: 0.0-0.7 K/uL 0.0  Basophils Absolute Latest Ref Range: 0.0-0.1 K/uL 0.0    ASSESSMENT & PLAN:  pT1cN0 triple negative carcinoma of the R breast S/P lumpectomy and sentinel LN procedure Iron deficiency Anemia Ulcerative Colitis followed by Dr. Laural Golden Ongoing Menses  We discussed breast cancer in a general sense. We then reviewed "types" of breast cancer.  I reviewed receptors, estrogen, progesterone and HER 2. We discussed what triple negative cancer is. We reviewed staging. She was given a copy of the NCCN guidelines for early stage breast cancer and the breast cancer navigation book. Relevant portions were reviewed in detail.  She is quite concerned about taking taxotere based upon commercials she has seen regarding permanent hair loss. Based upon this I would then proceed with [facility], followed by Taxol. We reviewed current guidelines of favored regimens.  Chemotherapy Counseling: I discussed the risks and benefits of chemotherapy including the risks of nausea/ vomiting, risk of infection from low WBC count, fatigue due to chemo or anemia, bruising or bleeding due to low platelets, mouth sores, loss/ change in taste and decreased appetite. Liver and kidney function will be monitored through out chemotherapy as abnormalities in liver and kidney function may be a side effect of treatment. Cardiac dysfunction due to Adriamycin were discussed in detail. Risk of permanent bone marrow dysfunction and leukemia due to chemo were also discussed.  We discussed that chemotherapy may induced menopause. We discussed safe sexual practices throughout chemotherapy. She will undergo chemotherapy teaching. She has port-a cath placement scheduled tomorrow with Dr. Anthony Sar in [place].   She has long standing iron deficiency, noting that she does not "do well" with iron pills and therefore will replace her iron IV in order to correct her anemia prior to starting chemotherapy.  I  have sent a note to Abby Potash regarding the patient's struggles with her 35 year old daughter and her cancer diagnosis.  We have ordered a MUGA given the use of adriamycin. I anticipate a chemotherapy start date around the 20th.   She will let us know when she hears back from [place] regarding the results of her genetic testing.  Orders Placed This Encounter  Procedures  . NM Cardiac M[facility] Rest    Standing Status: Future     Number of Occurrences:      Standing Expiration Date: 06/10/2016    Order Specific Question:  Reason for Exam (SYMPTOM  OR DIAGNOSIS REQUIRED)    Answer:  chemotherapy    Order Specific Question:  Is the patient pregnant?    Answer:  No    Order Specific Question:  Preferred imaging location?    Answer:  [facility]    Order Specific Question:  If indicated for the ordered procedure, I authorize the administration of a radiopharmaceutical per Radiology protocol    Answer:  Yes  . CBC with Differential    Standing Status: Future     Number of Occurrences: 1     Standing Expiration Date: 06/10/2016  . Comprehensive metabolic panel    Standing Status: Future     Number of Occurrences: 1     Standing Expiration Date: 06/10/2016  . Ferritin    Standing Status: Future     Number of Occurrences: 1     Standing Expiration Date: 06/10/2016  . Iron and TIBC    Standing Status: Future  Number of Occurrences: 1     Standing Expiration Date: 06/10/2016   All questions were answered. The patient knows to call the clinic with any problems, questions or concerns.  This document serves as a record of services personally performed by Ancil Linsey, MD. It was created on her behalf by Toni Amend, a trained medical scribe. The creation of this record is based on the scribe's personal observations and the provider's statements to them. This document has been checked and approved by the attending provider.  I have reviewed the above documentation for accuracy and  completeness and I agree with the above.  This note was electronically signed.  Molli Hazard, MD  06/11/2015 2:11 PM

## 2015-06-11 NOTE — Patient Instructions (Addendum)
[place] at [facility] Discharge Instructions  RECOMMENDATIONS MADE BY THE CONSULTANT AND ANY TEST RESULTS WILL BE SENT TO YOUR REFERRING PHYSICIAN.   You have triple negative breast cancer. This means that your tumor is not being "fed" by estrogen, progesterone, HER2 Neu. We have to use chemo to treat you.   Adriamycin & Cytoxan every 14 days x 4 cycles followed by weekly Taxol x 12 weeks.  Port tomorrow in [place]. You will remain sore from this procedure for probably 2 weeks.   The first time we use your port we will ICE it prior to [place] using it. This is to numb the skin over the port so you don't feel the needle stick as badly. After the 1st chemo, we will have you numb it at home with a numbing cream.   Chemo teaching next Wednesday from 11:30-1pm in the [facility] @ [place] will need to come to the [employer] @ the Main Entrance and let them know that you are there for chemo teaching in the [place]. They will direct you to the [place].   2 books given today on Breast Cancer.   Please call our scheduler Amy @ [phone] if you have any questions - please ask for [name].   We will do a 2D echo or MUGA to check the pumping muscle of the heart. We are testing this before chemo and periodically during the [facility] (adriamycin/cytoxan) portion of treatment. Adriamycin can potentially weaken the pumping muscle of the heart and this is why we perform these tests.   Do not get pregnant during chemo. Even though you have an IUD - please use a condom during sex. We do not want you getting pregnant while undergoing chemo.  You period could stop or get "spotty" during chemo. It may stop and then come back a year later.   [name] our social worker has been contacted regarding the situation with letting your daughter know.  We will check labs today. If you don't hear from [place] - just call us at [phone] and ask for the results.            Thank you for choosing [place] at [facility] to provide your oncology and hematology care.  To afford each patient quality time with our provider, please arrive at least 15 minutes before your scheduled appointment time.   Beginning January 23rd 2017 lab work for the [facility] will be done in the  Main lab at [facility] on 1st floor. If you have a lab appointment with the [facility] please come in thru the  Main Entrance and check in at the main information desk  You need to re-schedule your appointment should you arrive 10 or more minutes late.  We strive to give you quality time with our providers, and arriving late affects you and other patients whose appointments are after yours.  Also, if you no show three or more times for appointments you may be dismissed from the clinic at the providers discretion.     Again, thank you for choosing [facility].  Our hope is that these requests will decrease the amount of time that you wait before being seen by our physicians.       _____________________________________________________________  Should you have questions after your visit to [facility], please contact our office at [phone] between the hours of 8:30 a.m. and  4:30 p.m.  Voicemails left after 4:30 p.m. will not be returned until the following business day.  For prescription refill requests, have your pharmacy contact our office.         Resources For Cancer Patients and their Caregivers ? American Cancer Society: Can assist with transportation, wigs, general needs, runs Look Good Feel Better.        8055847420 ? Cancer Care: Provides financial assistance, online support groups, medication/co-pay assistance.  1-800-813-HOPE 573 706 4065) ? [place] Assists [place] cancer patients and their families through emotional , educational and financial support.   731 746 5386 ? [place] DSS Where to apply for food stamps, Medicaid and utility assistance. 608-218-0773 ? RCATS: Transportation to medical appointments. 313-200-4242 ? Social Security Administration: May apply for disability if have a Stage IV cancer. ([phone] [phone] ? [employer], Disability and Transit Services: Assists with nutrition, care and transit needs. [place] Support Programs: @10RELATIVEDAYS @ > Cancer Support Group  2nd Tuesday of the month 1pm-2pm, Journey Room  > Creative Journey  3rd Tuesday of the month 1130am-1pm, Journey Room  > Look Good Feel Better  1st Wednesday of the month 10am-12 noon, Journey Room (Call American Cancer Society to register [phone])  MUGA Scan A MUGA scan (multigated acquisition scan) is a test that makes pictures of your heart at specific times while it beats. The test uses a radioactive dye (tracer) that is injected into your bloodstream. The dye makes it possible for your health care provider to see the red blood cells passing through your heart. A MUGA scan shows:  How much blood your heart is pumping out with each heartbeat.  How well your ventricles are working. Ventricles are chambers in the lower part of your heart. They pump blood to your lungs and to the rest of your body. A MUGA scan may be done to determine:  What is causing heart symptoms, such as chest pain.  If the arteries that supply your heart are blocked.  If you have heart valve disease.  If your heart is damaged from a heart attack.  If your heart has trouble pumping blood. LET [facility] CARE PROVIDER KNOW ABOUT:  Any allergies you have.  All medicines you are taking, including vitamins, herbs, eye drops, creams, and over-the-counter medicines.  Any blood disorders you have.  Previous surgeries you have had.  Medical conditions you have.  If you are pregnant, if you might be pregnant, or if you are  breastfeeding. This is very important. RISKS AND COMPLICATIONS Generally this is a safe procedure. However, problems can occur and include:  An allergic reaction to the tracer. This side effect is rare.  Pain and redness at the IV site.  Potential harm to your baby if you are pregnant or breastfeeding. BEFORE THE PROCEDURE  Do not take your regular medicines before your procedure if your health care provider asks you not to. Ask your health care provider about changing or stopping those medicines.  Do not drink any beverages that have caffeine for several hours before the scan. Beverages containing caffeine include coffee, tea, and soft drinks.  Ask your health care provider if you will be having an exercise scan with your MUGA scan.  If you will be having an exercise scan with your MUGA scan, do not eat or drink anything except water for 4 hours before the scan.  Wear loose, comfortable clothing. PROCEDURE  You will be asked to lie down on an exam  table.  An IV tube will be put into one of your veins. Medicine will flow through the tube during the procedure.  Discs called electrodes will be attached to your chest, arms, and legs. These will be connected with wires to a machine.  A small amount of tracer will be injected through your IV. You may feel a cold sensation in your arm as it runs through your IV.  A camera will be placed over your chest. It will take a series of pictures while you rest.  If you need to have an exercise scan, you will walk on a treadmill or ride a stationary bicycle. Then you will be asked to lie back down on the exam table for more pictures.  After all of the pictures have been taken, your IV tube will be removed. AFTER THE PROCEDURE  Drink enough fluid to keep your urine clear or pale yellow. This helps to flush the tracer out of your body.  It is your responsibility to get your test results. Ask the lab or department performing the test when and how  you will get your results.  Keep all follow-up visits as directed by your health care provider. This is important.   This information is not intended to replace advice given to you by your health care provider. Make sure you discuss any questions you have with your health care provider.   Document Released: 02/08/2005 Document Revised: 01/10/2014 Document Reviewed: 05/09/2013 Elsevier Interactive Patient Education [address] Insertion An implanted port is a central line that has a round shape and is placed under the skin. It is used as a long-term IV access for:   Medicines, such as chemotherapy.   Fluids.   Liquid nutrition, such as total parenteral nutrition (TPN).   Blood samples.  LET [facility] CARE PROVIDER KNOW ABOUT:  Allergies to food or medicine.   Medicines taken, including vitamins, herbs, eye drops, creams, and over-the-counter medicines.   Any allergies to heparin.  Use of steroids (by mouth or creams).   Previous problems with anesthetics or numbing medicines.   History of bleeding problems or blood clots.   Previous surgery.   Other health problems, including diabetes and kidney problems.   Possibility of pregnancy, if this applies. RISKS AND COMPLICATIONS Generally, this is a safe procedure. However, as with any procedure, problems can occur. Possible problems include:  Damage to the blood vessel, bruising, or bleeding at the puncture site.   Infection.  Blood clot in the vessel that the port is in.  Breakdown of the skin over your port.  Very rarely a person may develop a condition called a pneumothorax, a collection of air in the chest that may cause one of the lungs to collapse. The placement of these catheters with the appropriate imaging guidance significantly decreases the risk of a pneumothorax.  BEFORE THE PROCEDURE   Your health care provider may want you to have blood tests. These tests can help tell  how well your kidneys and liver are working. They can also show how well your blood clots.   If you take blood thinners (anticoagulant medicines), ask your health care provider when you should stop taking them.   Make arrangements for someone to drive you home. This is necessary if you have been sedated for your procedure.  PROCEDURE  Port insertion usually takes about 30-45 minutes.   An IV needle will be inserted in your arm. Medicine for pain and medicine to help  relax you (sedative) will flow directly into your body through this needle.   You will lie on an exam table, and you will be connected to monitors to keep track of your heart rate, blood pressure, and breathing throughout the procedure.  An oxygen monitoring device may be attached to your finger. Oxygen will be given.   Everything will be kept as germ free (sterile) as possible during the procedure. The skin near the point of the incision will be cleansed with antiseptic, and the area will be draped with sterile towels. The skin and deeper tissues over the port area will be made numb with a local anesthetic.  Two small cuts (incisions) will be made in the skin to insert the port. One will be made in the neck to obtain access to the vein where the catheter will lie.   Because the port reservoir will be placed under the skin, a small skin incision will be made in the upper chest, and a small pocket for the port will be made under the skin. The catheter that will be connected to the port tunnels to a large central vein in the chest. A small, raised area will remain on your body at the site of the reservoir when the procedure is complete.  The port placement will be done under imaging guidance to ensure the proper placement.  The reservoir has a silicone covering that can be punctured with a special needle.   The port will be flushed with normal saline, and blood will be drawn to make sure it is working properly.  There  will be nothing remaining outside the skin when the procedure is finished.   Incisions will be held together by stitches, surgical glue, or a special tape. AFTER THE PROCEDURE  You will stay in a recovery area until the anesthesia has worn off. Your blood pressure and pulse will be checked.  A final chest X-ray will be taken to check the placement of the port and to ensure that there is no injury to your lung.   This information is not intended to replace advice given to you by your health care provider. Make sure you discuss any questions you have with your health care provider.   Document Released: 10/10/2012 Document Revised: 01/10/2014 Document Reviewed: 10/10/2012 Elsevier Interactive Patient Education [employer].

## 2015-06-12 ENCOUNTER — Other Ambulatory Visit (HOSPITAL_COMMUNITY): Payer: Self-pay | Admitting: Hematology & Oncology

## 2015-06-12 ENCOUNTER — Encounter (HOSPITAL_COMMUNITY): Payer: Self-pay | Admitting: Hematology & Oncology

## 2015-06-12 DIAGNOSIS — D500 Iron deficiency anemia secondary to blood loss (chronic): Secondary | ICD-10-CM | POA: Insufficient documentation

## 2015-06-12 HISTORY — DX: Iron deficiency anemia secondary to blood loss (chronic): D50.0

## 2015-06-13 MED ORDER — PROCHLORPERAZINE MALEATE 10 MG PO TABS: 10.0000 mg | ORAL_TABLET | Freq: Four times a day (QID) | ORAL | Status: DC | PRN

## 2015-06-13 MED ORDER — ONDANSETRON 8 MG PO TBDP: 8.0000 mg | ORAL_TABLET | Freq: Three times a day (TID) | ORAL | Status: DC | PRN

## 2015-06-13 MED ORDER — LIDOCAINE-PRILOCAINE 2.5-2.5 % EX CREA: TOPICAL_CREAM | CUTANEOUS | Status: DC

## 2015-06-13 NOTE — Patient Instructions (Signed)
[place]   CHEMOTHERAPY INSTRUCTIONS  We are giving you Adriamycin & Cytoxan every 14 days x 4 cycles and then Taxol once a week x 12 weeks for the treatment of your breast cancer.   Premeds: Aloxi - (high powered nausea medications) for nausea/vomiting prevention/reduction    Emend - (high powered nausea medications)for nausea/vomiting prevention/reduction   Dexamethasone - steroid - given to also decrease nausea/vomiting. Dex can cause you to feel energized, nervous/anxious/jittery, make you have trouble sleeping, and/or make you feel hot/flushed in the face/neck and/or look pink/red in the face/neck. These side effects will pass as the Dex wears off. (takes 30 minutes to infuse)   Chemo:  Adriamycin - bone marrow suppression (lowers blood counts), nausea, vomiting, hair loss, mouth sores, cardiotoxicity- weakening of the pumping muscle of the heart (this is why we do the MUGA scans), sensitivity to light, will turn urine red for a few voids after receiving it. After voiding red a few times, your urine should begin to go back to a normal yellow color. This meciation takes approximately 10-15 minutes to administer.  Cytoxan - can cause hemorrhagic cystitis (bloody urine) - this chemo irritates your bladder! We need you drinking 64 oz of fluid (preferably water/decaff fluids) 2 days prior to chemo and for up to 4-5 days after chemo. Drink more if you can. Do not hold your urine. Urinate before you go to bed and if you wake up in the middle of the night. This can also cause nausea/vomiting and hair loss. (takes 30 minutes to infuse)  Supportive therapy -  Neulasta - this medication is not chemo but being given because you have had chemo. It is usually given 27-28 hours after the completion of chemotherapy. This medication works by boosting your bone marrow's supply of white blood cells. White blood cells are what protect our bodies against infection. The  medication is given in the form of a subcutaneous injection. It is given in the fatty tissue of your abdomen. It is a short needle. The major side effect of this medication is bone or muscle pain. The drug of choice to relieve or lessen the pain is Aleve or Ibuprofen. If a physician has ever told you not to take Aleve or Ibuprofen - then don't take it. You should then take Tylenol/acetaminophen. Take either medication as the bottle directs you to.  The level of pain you experience as a result of this injection can range from none, to mild or moderate, or severe. Please let us know if you develop moderate or severe bone pain.    Once you complete the above chemo regimen you will proceed with the following chemo thereafter.   Premeds:  Zofran- for nausea/vomiting prevention/reduction    Dexamethasone - steroid - given to reduce the risk of you having an allergic type reaction to the chemotherapy. Dex can cause you to feel energized, nervous/anxious/jittery, make you have trouble sleeping, and/or make you feel hot/flushed in the face/neck and/or look pink/red in the face/neck. These side effects will pass as the Dex wears off. Pepcid - a type of anti-histamine - will be given to reduce the risk of you having an allergic reaction to the Taxol chemotherapy. Benadryl - anti-histamine - given to reduce th erisk of you having an allergic reaction to the Taxol chemotherapy.  Taxol - the first time you receive this drug we will titrate it very slowly to ensure that you do not have or are not having  an infusional reaction to the chemo. Side Effects: hair loss, lowers your white blood cells (fight infection), muscle aches, nausea/vomiting, irritation to the mouth (mouth sores, pain in your mouth) *neuropathy - numbness/tingling/burning in hands/fingers/feet/toes. We need to know as soon as this begins to happen so that we can monitor it and treat if necessary. The numbness generally begins in the fingertips of tips of  toes and then begins to travel up the finger/toe/hand/foot. We never want you getting to where you can't pick up a pen, coin, zip a zipper, button a button, or have trouble walking. You must tell us immediately if you are experiencing peripheral neuropathy! (the first time you receive this, it will take approximately 3 hours, after that it will only take 1 hour)    POTENTIAL SIDE EFFECTS OF TREATMENT: Increased Susceptibility to Infection, Vomiting, Constipation, Red or Pink Urine (with Adriamycin), Hair Thinning, Changes in Character of Skin and Nails (brittleness, dryness,etc.), Bone Marrow Suppression, Abdominal Cramping, Complete Hair Loss, Nausea, Diarrhea, Sun Sensitivity and Mouth Sores   SELF IMAGE NEEDS AND REFERRALS MADE: Obtain hair accessories as soon as possible (wigs, scarves, turbans,caps,etc.) and Referral to Look Good, Feel Better consultant    MEDICATIONS: You have been given prescriptions for the following medications:  Zofran/Ondansetron 75m tablet. Take 1 tablet every 8 hours as needed for nausea/vomiting. (#1 nausea med to take, this can constipate)  Compazine/Prochlorperazine 117mtablet. Take 1 tablet every 6 hours as needed for nausea/vomiting. (#2 nausea med to take, this can make you sleepy)  EMLA cream. Apply a quarter size amount to port site 1 hour prior to chemo. Do not rub in. Cover with plastic wrap.   Over-the-Counter Meds:  Miralax 1 capful in 8 oz of fluid daily. May increase to two times a day if needed. This is a stool softener. If this doesn't work proceed you can add:  Senokot S  - start with 1 tablet two times a day and increase to 4 tablets two times a day if needed. (total of 8 tablets in a 24 hour period). This is a stimulant laxative.   Call [place] this does not help your bowels move.   Imodium 53m63mapsule. Take 2 capsules after the 1st loose stool and then 1 capsule every 2 hours until you go a total of 12 hours without having a loose stool.  Call the [facility] loose stools continue. If diarrhea occurs @ bedtime, take 2 capsules @ bedtime. Then take 2 capsules every 4 hours until morning. Call [facility]  SYMPTOMS TO REPORT AS SOON AS POSSIBLE AFTER TREATMENT:  FEVER GREATER THAN 100.5 F  CHILLS WITH OR WITHOUT FEVER  NAUSEA AND VOMITING THAT IS NOT CONTROLLED WITH YOUR NAUSEA MEDICATION  UNUSUAL SHORTNESS OF BREATH  UNUSUAL BRUISING OR BLEEDING  TENDERNESS IN MOUTH AND THROAT WITH OR WITHOUT PRESENCE OF ULCERS  URINARY PROBLEMS  BOWEL PROBLEMS  UNUSUAL RASH    Wear comfortable clothing and clothing appropriate for easy access to any Portacath or PICC line. Let us [place] if there is anything that we can do to make your therapy better!      I have been informed and understand all of the instructions given to me and have received a copy. I have been instructed to call the clinic ([phone] my family physician as soon as possible for continued medical care, if indicated. I do not have any more questions at this time but understand that I may call  the [place] or the Patient Navigator at [phone] during office hours should I have questions or need assistance in obtaining follow-up care.

## 2015-06-15 ENCOUNTER — Other Ambulatory Visit (HOSPITAL_COMMUNITY): Payer: Self-pay | Admitting: Oncology

## 2015-06-16 ENCOUNTER — Encounter: Payer: Self-pay | Admitting: *Deleted

## 2015-06-16 NOTE — Progress Notes (Signed)
[place] Clinical Social Work  Clinical Social Work was referred by Medical sales representative for assessment of psychosocial needs due to assisting pt with coping. Clinical Social Worker contacted patient at home to offer support and assess for needs.  CSW introduced self and explained role of CSW. Pt reports to live with her 44 yo daughter. She reports to share custody with her daughter's father. They have differing opinions as to what information they feel 44 yo can handle. CSW discussed common developmentally appropriate reactions and coping techniques. CSW discussed local resources and options to assist 44 yo as well. Pt reports to have strong support from family and friends. She has MCD and receives food stamps. Pt reports her mother will assist in bringing her to her appointments. CSW putting together packet of resources to assist. Pt aware to reach out and seek assistance from CSW as needed.    Clinical Social Work interventions: Adjustment to illness Publishing rights manager education  [name], [place] Tuesdays   [phone]

## 2015-06-17 ENCOUNTER — Encounter (HOSPITAL_COMMUNITY): Payer: Medicaid Other

## 2015-06-17 ENCOUNTER — Encounter (HOSPITAL_COMMUNITY)
Admission: RE | Admit: 2015-06-17 | Discharge: 2015-06-17 | Disposition: A | Discharge status: Home / Self Care | Payer: Medicaid Other | Source: Ambulatory Visit | Attending: Hematology & Oncology | Admitting: Hematology & Oncology

## 2015-06-17 ENCOUNTER — Encounter (HOSPITAL_COMMUNITY): Payer: Self-pay

## 2015-06-17 DIAGNOSIS — C50411 Malignant neoplasm of upper-outer quadrant of right female breast: Secondary | ICD-10-CM | POA: Diagnosis present

## 2015-06-17 DIAGNOSIS — D500 Iron deficiency anemia secondary to blood loss (chronic): Secondary | ICD-10-CM | POA: Diagnosis present

## 2015-06-17 DIAGNOSIS — Z9221 Personal history of antineoplastic chemotherapy: Secondary | ICD-10-CM | POA: Diagnosis present

## 2015-06-17 MED ORDER — TECHNETIUM TC 99M-LABELED RED BLOOD CELLS IV KIT
25.0000 | PACK | Freq: Once | INTRAVENOUS | Status: AC | PRN
  Administered 2015-06-17: 25 via INTRAVENOUS

## 2015-06-17 MED ORDER — HEPARIN SOD (PORK) LOCK FLUSH 100 UNIT/ML IV SOLN
INTRAVENOUS | Status: AC
  Filled 2015-06-17: qty 5

## 2015-06-17 NOTE — Progress Notes (Signed)
Chemo teaching done today in [place] by chemo teaching nurse. Consent to be signed on Day 1 of chemo.

## 2015-06-19 ENCOUNTER — Other Ambulatory Visit (HOSPITAL_COMMUNITY): Payer: Self-pay | Admitting: Hematology & Oncology

## 2015-06-22 ENCOUNTER — Encounter (HOSPITAL_BASED_OUTPATIENT_CLINIC_OR_DEPARTMENT_OTHER): Payer: Medicaid Other

## 2015-06-22 VITALS — BP 137/76 | HR 71 | Temp 98.5°F | Resp 20

## 2015-06-22 DIAGNOSIS — D509 Iron deficiency anemia, unspecified: Secondary | ICD-10-CM

## 2015-06-22 DIAGNOSIS — D500 Iron deficiency anemia secondary to blood loss (chronic): Secondary | ICD-10-CM

## 2015-06-22 MED ORDER — FERUMOXYTOL INJECTION 510 MG/17 ML
510.0000 mg | Freq: Once | INTRAVENOUS | Status: AC
  Administered 2015-06-22: 510 mg via INTRAVENOUS
  Filled 2015-06-22: qty 17

## 2015-06-22 MED ORDER — SODIUM CHLORIDE 0.9 % IV SOLN
INTRAVENOUS | Status: DC
  Administered 2015-06-22: 14:00:00 via INTRAVENOUS

## 2015-06-22 MED ORDER — HEPARIN SOD (PORK) LOCK FLUSH 100 UNIT/ML IV SOLN
500.0000 [IU] | Freq: Once | INTRAVENOUS | Status: AC
  Administered 2015-06-22: 500 [IU] via INTRAVENOUS

## 2015-06-22 MED ORDER — HEPARIN SOD (PORK) LOCK FLUSH 100 UNIT/ML IV SOLN
INTRAVENOUS | Status: AC
  Filled 2015-06-22: qty 5

## 2015-06-22 NOTE — Patient Instructions (Signed)
[place] at [facility] Discharge Instructions  RECOMMENDATIONS MADE BY THE CONSULTANT AND ANY TEST RESULTS WILL BE SENT TO YOUR REFERRING PHYSICIAN.  IV iron today.    Thank you for choosing [place] at [facility] to provide your oncology and hematology care.  To afford each patient quality time with our provider, please arrive at least 15 minutes before your scheduled appointment time.   Beginning January 23rd 2017 lab work for the [facility] will be done in the  Main lab at [facility] on 1st floor. If you have a lab appointment with the [place] please come in thru the  Main Entrance and check in at the main information desk  You need to re-schedule your appointment should you arrive 10 or more minutes late.  We strive to give you quality time with our providers, and arriving late affects you and other patients whose appointments are after yours.  Also, if you no show three or more times for appointments you may be dismissed from the clinic at the providers discretion.     Again, thank you for choosing [facility].  Our hope is that these requests will decrease the amount of time that you wait before being seen by our physicians.       _____________________________________________________________  Should you have questions after your visit to [facility], please contact our office at [phone] between the hours of 8:30 a.m. and 4:30 p.m.  Voicemails left after 4:30 p.m. will not be returned until the following business day.  For prescription refill requests, have your pharmacy contact our office.         Resources For Cancer Patients and their Caregivers ? American Cancer Society: Can assist with transportation, wigs, general needs, runs Look Good Feel Better.        747-074-4568 ? Cancer Care: Provides financial assistance, online support groups, medication/co-pay assistance.  1-800-813-HOPE  810-710-7460) ? [place] Assists [place] cancer patients and their families through emotional , educational and financial support.  (319)385-5928 ? [place] DSS Where to apply for food stamps, Medicaid and utility assistance. 951-138-5118 ? RCATS: Transportation to medical appointments. 769-352-4228 ? Social Security Administration: May apply for disability if have a Stage IV cancer. ([phone] 913-587-0224 ? [employer], Disability and Transit Services: Assists with nutrition, care and transit needs. [place] Support Programs: @10RELATIVEDAYS @ > Cancer Support Group  2nd Tuesday of the month 1pm-2pm, Journey Room  > Creative Journey  3rd Tuesday of the month 1130am-1pm, Journey Room  > Look Good Feel Better  1st Wednesday of the month 10am-12 noon, Journey Room (Call [place] to register [phone])

## 2015-06-22 NOTE — Progress Notes (Signed)
Patient tolerated infusion well.  VSS.

## 2015-06-25 ENCOUNTER — Encounter (HOSPITAL_COMMUNITY): Payer: Self-pay | Admitting: Oncology

## 2015-06-25 ENCOUNTER — Encounter (HOSPITAL_COMMUNITY): Payer: Medicaid Other

## 2015-06-25 ENCOUNTER — Encounter (HOSPITAL_BASED_OUTPATIENT_CLINIC_OR_DEPARTMENT_OTHER): Payer: Medicaid Other | Admitting: Oncology

## 2015-06-25 VITALS — BP 137/85 | HR 65 | Temp 98.4°F | Resp 16 | Wt 177.2 lb

## 2015-06-25 VITALS — BP 155/90 | HR 96 | Temp 98.6°F | Resp 18

## 2015-06-25 DIAGNOSIS — C50411 Malignant neoplasm of upper-outer quadrant of right female breast: Secondary | ICD-10-CM | POA: Diagnosis present

## 2015-06-25 DIAGNOSIS — Z5111 Encounter for antineoplastic chemotherapy: Secondary | ICD-10-CM

## 2015-06-25 DIAGNOSIS — Z5189 Encounter for other specified aftercare: Secondary | ICD-10-CM

## 2015-06-25 DIAGNOSIS — D500 Iron deficiency anemia secondary to blood loss (chronic): Secondary | ICD-10-CM | POA: Diagnosis not present

## 2015-06-25 DIAGNOSIS — R58 Hemorrhage, not elsewhere classified: Secondary | ICD-10-CM

## 2015-06-25 LAB — COMPREHENSIVE METABOLIC PANEL
ALK PHOS: 62 U/L (ref 38–126)
ALT: 16 U/L (ref 14–54)
AST: 18 U/L (ref 15–41)
Albumin: 3.7 g/dL (ref 3.5–5.0)
Anion gap: 4 — ABNORMAL LOW (ref 5–15)
BILIRUBIN TOTAL: 1.1 mg/dL (ref 0.3–1.2)
BUN: 8 mg/dL (ref 6–20)
CALCIUM: 8.6 mg/dL — AB (ref 8.9–10.3)
CHLORIDE: 105 mmol/L (ref 101–111)
CO2: 28 mmol/L (ref 22–32)
CREATININE: 0.38 mg/dL — AB (ref 0.44–1.00)
Glucose, Bld: 136 mg/dL — ABNORMAL HIGH (ref 65–99)
Potassium: 3.8 mmol/L (ref 3.5–5.1)
Sodium: 137 mmol/L (ref 135–145)
TOTAL PROTEIN: 6.6 g/dL (ref 6.5–8.1)

## 2015-06-25 LAB — CBC WITH DIFFERENTIAL/PLATELET
BASOS PCT: 0 %
Basophils Absolute: 0 10*3/uL (ref 0.0–0.1)
EOS ABS: 0 10*3/uL (ref 0.0–0.7)
EOS PCT: 0 %
HCT: 31.5 % — ABNORMAL LOW (ref 36.0–46.0)
HEMOGLOBIN: 10.4 g/dL — AB (ref 12.0–15.0)
Lymphocytes Relative: 44 %
Lymphs Abs: 1.7 10*3/uL (ref 0.7–4.0)
MCH: 27.7 pg (ref 26.0–34.0)
MCHC: 33 g/dL (ref 30.0–36.0)
MCV: 83.8 fL (ref 78.0–100.0)
Monocytes Absolute: 0.3 10*3/uL (ref 0.1–1.0)
Monocytes Relative: 8 %
NEUTROS PCT: 48 %
Neutro Abs: 1.9 10*3/uL (ref 1.7–7.7)
PLATELETS: 172 10*3/uL (ref 150–400)
RBC: 3.76 MIL/uL — AB (ref 3.87–5.11)
RDW: 15.4 % (ref 11.5–15.5)
WBC: 4 10*3/uL (ref 4.0–10.5)

## 2015-06-25 MED ORDER — SODIUM CHLORIDE 0.9% FLUSH: 10.0000 mL | INTRAVENOUS | Status: DC | PRN

## 2015-06-25 MED ORDER — SODIUM CHLORIDE 0.9 % IV SOLN
600.0000 mg/m2 | Freq: Once | INTRAVENOUS | Status: AC
  Administered 2015-06-25: 1120 mg via INTRAVENOUS
  Filled 2015-06-25: qty 50

## 2015-06-25 MED ORDER — HEPARIN SOD (PORK) LOCK FLUSH 100 UNIT/ML IV SOLN
500.0000 [IU] | Freq: Once | INTRAVENOUS | Status: AC | PRN
  Administered 2015-06-25: 500 [IU]
  Filled 2015-06-25: qty 5

## 2015-06-25 MED ORDER — SODIUM CHLORIDE 0.9 % IV SOLN
Freq: Once | INTRAVENOUS | Status: AC
  Administered 2015-06-25: 10:00:00 via INTRAVENOUS
  Filled 2015-06-25: qty 5

## 2015-06-25 MED ORDER — SODIUM CHLORIDE 0.9 % IV SOLN
Freq: Once | INTRAVENOUS | Status: AC
  Administered 2015-06-25: 10:00:00 via INTRAVENOUS

## 2015-06-25 MED ORDER — PALONOSETRON HCL INJECTION 0.25 MG/5ML
0.2500 mg | Freq: Once | INTRAVENOUS | Status: AC
  Administered 2015-06-25: 0.25 mg via INTRAVENOUS
  Filled 2015-06-25: qty 5

## 2015-06-25 MED ORDER — PEGFILGRASTIM 6 MG/0.6ML ~~LOC~~ PSKT
6.0000 mg | PREFILLED_SYRINGE | Freq: Once | SUBCUTANEOUS | Status: AC
  Administered 2015-06-25: 6 mg via SUBCUTANEOUS
  Filled 2015-06-25: qty 0.6

## 2015-06-25 MED ORDER — DOXORUBICIN HCL CHEMO IV INJECTION 2 MG/ML
60.0000 mg/m2 | Freq: Once | INTRAVENOUS | Status: AC
  Administered 2015-06-25: 112 mg via INTRAVENOUS
  Filled 2015-06-25: qty 56

## 2015-06-25 NOTE — Patient Instructions (Signed)
[facility] Discharge Instructions for Patients Receiving Chemotherapy   Beginning January 23rd 2017 lab work for the [facility] will be done in the  Main lab at [facility] on 1st floor. If you have a lab appointment with the [place] please come in thru the  Main Entrance and check in at the main information desk   Today you received the following chemotherapy agents  neulasta on-pro  Side effects from adriamycin include: Nausea and vomiting Low blood counts Hair loss Watery eyes Mouth sores Urine that is red-brown or orange in color Darkened nail beds    Side effects from cytoxan Low blood counts Hair loss Nausea and vomiting  Poor appetite  Discolored nail beds    Pegfilgrastim injection What is this medicine? PEGFILGRASTIM (PEG fil gra stim) is a long-acting granulocyte colony-stimulating factor that stimulates the growth of neutrophils, a type of white blood cell important in the body's fight against infection. It is used to reduce the incidence of fever and infection in patients with certain types of cancer who are receiving chemotherapy that affects the bone marrow, and to increase survival after being exposed to high doses of radiation. This medicine may be used for other purposes; ask your health care provider or pharmacist if you have questions. What should I tell my health care provider before I take this medicine? They need to know if you have any of these conditions: -kidney disease -latex allergy -ongoing radiation therapy -sickle cell disease -skin reactions to acrylic adhesives (On-Body Injector only) -an unusual or allergic reaction to pegfilgrastim, filgrastim, other medicines, foods, dyes, or preservatives -pregnant or trying to get pregnant -breast-feeding How should I use this medicine? This medicine is for injection under the skin. If you get this medicine at home, you will be taught how to prepare and give the pre-filled  syringe or how to use the On-body Injector. Refer to the patient Instructions for Use for detailed instructions. Use exactly as directed. Take your medicine at regular intervals. Do not take your medicine more often than directed. It is important that you put your used needles and syringes in a special sharps container. Do not put them in a trash can. If you do not have a sharps container, call your pharmacist or healthcare provider to get one. Talk to your pediatrician regarding the use of this medicine in children. While this drug may be prescribed for selected conditions, precautions do apply. Overdosage: If you think you have taken too much of this medicine contact a poison control center or emergency room at once. NOTE: This medicine is only for you. Do not share this medicine with others. What if I miss a dose? It is important not to miss your dose. Call your doctor or health care professional if you miss your dose. If you miss a dose due to an On-body Injector failure or leakage, a new dose should be administered as soon as possible using a single prefilled syringe for manual use. What may interact with this medicine? Interactions have not been studied. Give your health care provider a list of all the medicines, herbs, non-prescription drugs, or dietary supplements you use. Also tell them if you smoke, drink alcohol, or use illegal drugs. Some items may interact with your medicine. This list may not describe all possible interactions. Give your health care provider a list of all the medicines, herbs, non-prescription drugs, or dietary supplements you use. Also tell them if you smoke, drink alcohol, or use illegal  drugs. Some items may interact with your medicine. What should I watch for while using this medicine? You may need blood work done while you are taking this medicine. If you are going to need a MRI, CT scan, or other procedure, tell your doctor that you are using this medicine (On-Body  Injector only). What side effects may I notice from receiving this medicine? Side effects that you should report to your doctor or health care professional as soon as possible: -allergic reactions like skin rash, itching or hives, swelling of the face, lips, or tongue -dizziness -fever -pain, redness, or irritation at site where injected -pinpoint red spots on the skin -red or dark-brown urine -shortness of breath or breathing problems -stomach or side pain, or pain at the shoulder -swelling -tiredness -trouble passing urine or change in the amount of urine Side effects that usually do not require medical attention (report to your doctor or health care professional if they continue or are bothersome): -bone pain -muscle pain This list may not describe all possible side effects. Call your doctor for medical advice about side effects. You may report side effects to FDA at 1-800-FDA-1088. Where should I keep my medicine? Keep out of the reach of children. Store pre-filled syringes in a refrigerator between 2 and 8 degrees C (36 and 46 degrees F). Do not freeze. Keep in carton to protect from light. Throw away this medicine if it is left out of the refrigerator for more than 48 hours. Throw away any unused medicine after the expiration date. NOTE: This sheet is a summary. It may not cover all possible information. If you have questions about this medicine, talk to your doctor, pharmacist, or health care provider.    2016, Elsevier/Gold Standard. (2014-01-09 14:30:14)     Cyclophosphamide injection What is this medicine? CYCLOPHOSPHAMIDE (sye kloe FOSS fa mide) is a chemotherapy drug. It slows the growth of cancer cells. This medicine is used to treat many types of cancer like lymphoma, myeloma, leukemia, breast cancer, and ovarian cancer, to name a few. This medicine may be used for other purposes; ask your health care provider or pharmacist if you have questions. What should I tell my  health care provider before I take this medicine? They need to know if you have any of these conditions: -blood disorders -history of other chemotherapy -infection -kidney disease -liver disease -recent or ongoing radiation therapy -tumors in the bone marrow -an unusual or allergic reaction to cyclophosphamide, other chemotherapy, other medicines, foods, dyes, or preservatives -pregnant or trying to get pregnant -breast-feeding How should I use this medicine? This drug is usually given as an injection into a vein or muscle or by infusion into a vein. It is administered in a hospital or clinic by a specially trained health care professional. Talk to your pediatrician regarding the use of this medicine in children. Special care may be needed. Overdosage: If you think you have taken too much of this medicine contact a poison control center or emergency room at once. NOTE: This medicine is only for you. Do not share this medicine with others. What if I miss a dose? It is important not to miss your dose. Call your doctor or health care professional if you are unable to keep an appointment. What may interact with this medicine? This medicine may interact with the following medications: -amiodarone -amphotericin B -azathioprine -certain antiviral medicines for HIV or AIDS such as protease inhibitors (e.g., indinavir, ritonavir) and zidovudine -certain blood pressure medications such as  benazepril, captopril, enalapril, fosinopril, lisinopril, moexipril, monopril, perindopril, quinapril, ramipril, trandolapril -certain cancer medications such as anthracyclines (e.g., daunorubicin, doxorubicin), busulfan, cytarabine, paclitaxel, pentostatin, tamoxifen, trastuzumab -certain diuretics such as chlorothiazide, chlorthalidone, hydrochlorothiazide, indapamide, metolazone -certain medicines that treat or prevent blood clots like warfarin -certain muscle relaxants such as  succinylcholine -cyclosporine -etanercept -indomethacin -medicines to increase blood counts like filgrastim, pegfilgrastim, sargramostim -medicines used as general anesthesia -metronidazole -natalizumab This list may not describe all possible interactions. Give your health care provider a list of all the medicines, herbs, non-prescription drugs, or dietary supplements you use. Also tell them if you smoke, drink alcohol, or use illegal drugs. Some items may interact with your medicine. What should I watch for while using this medicine? Visit your doctor for checks on your progress. This drug may make you feel generally unwell. This is not uncommon, as chemotherapy can affect healthy cells as well as cancer cells. Report any side effects. Continue your course of treatment even though you feel ill unless your doctor tells you to stop. Drink water or other fluids as directed. Urinate often, even at night. In some cases, you may be given additional medicines to help with side effects. Follow all directions for their use. Call your doctor or health care professional for advice if you get a fever, chills or sore throat, or other symptoms of a cold or flu. Do not treat yourself. This drug decreases your body's ability to fight infections. Try to avoid being around people who are sick. This medicine may increase your risk to bruise or bleed. Call your doctor or health care professional if you notice any unusual bleeding. Be careful brushing and flossing your teeth or using a toothpick because you may get an infection or bleed more easily. If you have any dental work done, tell your dentist you are receiving this medicine. You may get drowsy or dizzy. Do not drive, use machinery, or do anything that needs mental alertness until you know how this medicine affects you. Do not become pregnant while taking this medicine or for 1 year after stopping it. Women should inform their doctor if they wish to become  pregnant or think they might be pregnant. Men should not father a child while taking this medicine and for 4 months after stopping it. There is a potential for serious side effects to an unborn child. Talk to your health care professional or pharmacist for more information. Do not breast-feed an infant while taking this medicine. This medicine may interfere with the ability to have a child. This medicine has caused ovarian failure in some women. This medicine has caused reduced sperm counts in some men. You should talk with your doctor or health care professional if you are concerned about your fertility. If you are going to have surgery, tell your doctor or health care professional that you have taken this medicine. What side effects may I notice from receiving this medicine? Side effects that you should report to your doctor or health care professional as soon as possible: -allergic reactions like skin rash, itching or hives, swelling of the face, lips, or tongue -low blood counts - this medicine may decrease the number of white blood cells, red blood cells and platelets. You may be at increased risk for infections and bleeding. -signs of infection - fever or chills, cough, sore throat, pain or difficulty passing urine -signs of decreased platelets or bleeding - bruising, pinpoint red spots on the skin, black, tarry stools, blood in the urine -signs  of decreased red blood cells - unusually weak or tired, fainting spells, lightheadedness -breathing problems -dark urine -dizziness -palpitations -swelling of the ankles, feet, hands -trouble passing urine or change in the amount of urine -weight gain -yellowing of the eyes or skin Side effects that usually do not require medical attention (report to your doctor or health care professional if they continue or are bothersome): -changes in nail or skin color -hair loss -missed menstrual periods -mouth sores -nausea, vomiting This list may not  describe all possible side effects. Call your doctor for medical advice about side effects. You may report side effects to FDA at 1-800-FDA-1088. Where should I keep my medicine? This drug is given in a hospital or clinic and will not be stored at home. NOTE: This sheet is a summary. It may not cover all possible information. If you have questions about this medicine, talk to your doctor, pharmacist, or health care provider.    2016, Elsevier/Gold Standard. (2011-11-04 16:22:58)     Doxorubicin injection What is this medicine? DOXORUBICIN (dox oh ROO bi sin) is a chemotherapy drug. It is used to treat many kinds of cancer like Hodgkin's disease, leukemia, non-Hodgkin's lymphoma, neuroblastoma, sarcoma, and Wilms' tumor. It is also used to treat bladder cancer, breast cancer, lung cancer, ovarian cancer, stomach cancer, and thyroid cancer. This medicine may be used for other purposes; ask your health care provider or pharmacist if you have questions. What should I tell my health care provider before I take this medicine? They need to know if you have any of these conditions: -blood disorders -heart disease, recent heart attack -infection (especially a virus infection such as chickenpox, cold sores, or herpes) -irregular heartbeat -liver disease -recent or ongoing radiation therapy -an unusual or allergic reaction to doxorubicin, other chemotherapy agents, other medicines, foods, dyes, or preservatives -pregnant or trying to get pregnant -breast-feeding How should I use this medicine? This drug is given as an infusion into a vein. It is administered in a hospital or clinic by a specially trained health care professional. If you have pain, swelling, burning or any unusual feeling around the site of your injection, tell your health care professional right away. Talk to your pediatrician regarding the use of this medicine in children. Special care may be needed. Overdosage: If you think you  have taken too much of this medicine contact a poison control center or emergency room at once. NOTE: This medicine is only for you. Do not share this medicine with others. What if I miss a dose? It is important not to miss your dose. Call your doctor or health care professional if you are unable to keep an appointment. What may interact with this medicine? Do not take this medicine with any of the following medications: -cisapride -droperidol -halofantrine -pimozide -zidovudine This medicine may also interact with the following medications: -chloroquine -chlorpromazine -clarithromycin -cyclophosphamide -cyclosporine -erythromycin -medicines for depression, anxiety, or psychotic disturbances -medicines for irregular heart beat like amiodarone, bepridil, dofetilide, encainide, flecainide, propafenone, quinidine -medicines for seizures like ethotoin, fosphenytoin, phenytoin -medicines for nausea, vomiting like dolasetron, ondansetron, palonosetron -medicines to increase blood counts like filgrastim, pegfilgrastim, sargramostim -methadone -methotrexate -pentamidine -progesterone -vaccines -verapamil Talk to your doctor or health care professional before taking any of these medicines: -acetaminophen -aspirin -ibuprofen -ketoprofen -naproxen This list may not describe all possible interactions. Give your health care provider a list of all the medicines, herbs, non-prescription drugs, or dietary supplements you use. Also tell them if you smoke, drink alcohol, or  use illegal drugs. Some items may interact with your medicine. What should I watch for while using this medicine? Your condition will be monitored carefully while you are receiving this medicine. You will need important blood work done while you are taking this medicine. This drug may make you feel generally unwell. This is not uncommon, as chemotherapy can affect healthy cells as well as cancer cells. Report any side effects.  Continue your course of treatment even though you feel ill unless your doctor tells you to stop. Your urine may turn red for a few days after your dose. This is not blood. If your urine is dark or brown, call your doctor. In some cases, you may be given additional medicines to help with side effects. Follow all directions for their use. Call your doctor or health care professional for advice if you get a fever, chills or sore throat, or other symptoms of a cold or flu. Do not treat yourself. This drug decreases your body's ability to fight infections. Try to avoid being around people who are sick. This medicine may increase your risk to bruise or bleed. Call your doctor or health care professional if you notice any unusual bleeding. Be careful brushing and flossing your teeth or using a toothpick because you may get an infection or bleed more easily. If you have any dental work done, tell your dentist you are receiving this medicine. Avoid taking products that contain aspirin, acetaminophen, ibuprofen, naproxen, or ketoprofen unless instructed by your doctor. These medicines may hide a fever. Men and women of childbearing age should use effective birth control methods while using taking this medicine. Do not become pregnant while taking this medicine. There is a potential for serious side effects to an unborn child. Talk to your health care professional or pharmacist for more information. Do not breast-feed an infant while taking this medicine. Do not let others touch your urine or other body fluids for 5 days after each treatment with this medicine. Caregivers should wear latex gloves to avoid touching body fluids during this time. There is a maximum amount of this medicine you should receive throughout your life. The amount depends on the medical condition being treated and your overall health. Your doctor will watch how much of this medicine you receive in your lifetime. Tell your doctor if you have  taken this medicine before. What side effects may I notice from receiving this medicine? Side effects that you should report to your doctor or health care professional as soon as possible: -allergic reactions like skin rash, itching or hives, swelling of the face, lips, or tongue -low blood counts - this medicine may decrease the number of white blood cells, red blood cells and platelets. You may be at increased risk for infections and bleeding. -signs of infection - fever or chills, cough, sore throat, pain or difficulty passing urine -signs of decreased platelets or bleeding - bruising, pinpoint red spots on the skin, black, tarry stools, blood in the urine -signs of decreased red blood cells - unusually weak or tired, fainting spells, lightheadedness -breathing problems -chest pain -fast, irregular heartbeat -mouth sores -nausea, vomiting -pain, swelling, redness at site where injected -pain, tingling, numbness in the hands or feet -swelling of ankles, feet, or hands -unusual bleeding or bruising Side effects that usually do not require medical attention (report to your doctor or health care professional if they continue or are bothersome): -diarrhea -facial flushing -hair loss -loss of appetite -missed menstrual periods -nail discoloration or  damage -red or watery eyes -red colored urine -stomach upset This list may not describe all possible side effects. Call your doctor for medical advice about side effects. You may report side effects to FDA at 1-800-FDA-1088. Where should I keep my medicine? This drug is given in a hospital or clinic and will not be stored at home. NOTE: This sheet is a summary. It may not cover all possible information. If you have questions about this medicine, talk to your doctor, pharmacist, or health care provider.    2016, Elsevier/Gold Standard. (2012-04-17 09:54:34)     To help prevent nausea and vomiting after your treatment, we encourage you to  take your nausea medication   If you develop nausea and vomiting, or diarrhea that is not controlled by your medication, call the clinic.  The clinic phone number is [phone]. Office hours are Monday-Friday 8:30am-5:00pm.  BELOW ARE SYMPTOMS THAT SHOULD BE REPORTED IMMEDIATELY:  *FEVER GREATER THAN 101.0 F  *CHILLS WITH OR WITHOUT FEVER  NAUSEA AND VOMITING THAT IS NOT CONTROLLED WITH YOUR NAUSEA MEDICATION  *UNUSUAL SHORTNESS OF BREATH  *UNUSUAL BRUISING OR BLEEDING  TENDERNESS IN MOUTH AND THROAT WITH OR WITHOUT PRESENCE OF ULCERS  *URINARY PROBLEMS  *BOWEL PROBLEMS  UNUSUAL RASH Items with * indicate a potential emergency and should be followed up as soon as possible. If you have an emergency after office hours please contact your primary care physician or go to the nearest emergency department.  Please call the clinic during office hours if you have any questions or concerns.   You may also contact the Patient Navigator at [phone] should you have any questions or need assistance in obtaining follow up care.      Resources For Cancer Patients and their Caregivers ? American Cancer Society: Can assist with transportation, wigs, general needs, runs Look Good Feel Better.        334 532 5980 ? Cancer Care: Provides financial assistance, online support groups, medication/co-pay assistance.  1-800-813-HOPE 228-746-6653) ? [facility] [place] cancer patients and their families through emotional , educational and financial support.  (765) 054-5826 ? [place] DSS Where to apply for food stamps, Medicaid and utility assistance. (681)204-9014 ? [facility]: Transportation to medical appointments. 912-529-8387 ? Social Security Administration: May apply for disability if have a Stage IV cancer. 626-866-7952 8707578048 ? [employer], Disability and Transit Services: Assists with nutrition, care and transit needs.  [phone]

## 2015-06-25 NOTE — Progress Notes (Signed)
Consent signed for treatment.  Teaching of side effects from cytoxan and adrimycian and neulasta.  neulasta OBI explained.  Brittney [name] Tolerated chemotherapy well today Discharged ambulatory  .Marland KitchenAntonette S Millerpresents today for neulasta OBI placement per MD orders. OBI device filled per protocol and placed on left Upper Arm. Needle/catheter placement noted prior to patient leaving. Tolerated without incident and aware of injection to be delivered in  27 hours.

## 2015-06-25 NOTE — Patient Instructions (Signed)
[place] at [facility] Discharge Instructions  RECOMMENDATIONS MADE BY THE CONSULTANT AND ANY TEST RESULTS WILL BE SENT TO YOUR REFERRING PHYSICIAN.  You were seen by Darcella Gasman, PA today. You will have your treatment as scheduled today.   Return to the clinic for follow-up as scheduled.   Call the clinic with any questions or concerns.    Thank you for choosing [place] at [facility] to provide your oncology and hematology care.  To afford each patient quality time with our provider, please arrive at least 15 minutes before your scheduled appointment time.   Beginning January 23rd 2017 lab work for the [facility] will be done in the  Main lab at [facility] on 1st floor. If you have a lab appointment with the [facility] please come in thru the  Main Entrance and check in at the main information desk  You need to re-schedule your appointment should you arrive 10 or more minutes late.  We strive to give you quality time with our providers, and arriving late affects you and other patients whose appointments are after yours.  Also, if you no show three or more times for appointments you may be dismissed from the clinic at the providers discretion.     Again, thank you for choosing [facility].  Our hope is that these requests will decrease the amount of time that you wait before being seen by our physicians.       _____________________________________________________________  Should you have questions after your visit to [facility], please contact our office at [phone] between the hours of 8:30 a.m. and 4:30 p.m.  Voicemails left after 4:30 p.m. will not be returned until the following business day.  For prescription refill requests, have your pharmacy contact our office.         Resources For Cancer Patients and their Caregivers ? American Cancer Society: Can assist with transportation, wigs,  general needs, runs Look Good Feel Better.        228-238-4774 ? Cancer Care: Provides financial assistance, online support groups, medication/co-pay assistance.  1-800-813-HOPE 223-263-6170) ? [place] Assists [place] cancer patients and their families through emotional , educational and financial support.  774-843-5174 ? [place] DSS Where to apply for food stamps, Medicaid and utility assistance. 731-591-2271 ? RCATS: Transportation to medical appointments. 720-020-7952 ? Social Security Administration: May apply for disability if have a Stage IV cancer. 706-667-9878 2023886317 ? [employer], Disability and Transit Services: Assists with nutrition, care and transit needs. [place] Support Programs: @10RELATIVEDAYS @ > Cancer Support Group  2nd Tuesday of the month 1pm-2pm, Journey Room  > Creative Journey  3rd Tuesday of the month 1130am-1pm, Journey Room  > Look Good Feel Better  1st Wednesday of the month 10am-12 noon, Journey Room (Call [place] to register [phone])

## 2015-06-25 NOTE — Assessment & Plan Note (Signed)
Iron deficiency anemia secondary to chronic GI blood loss from UC with poor tolerance of PO replacement, thereby requiring IV replacement when indicated.  Oncology Flowsheet 06/22/2015  ferumoxytol [facility]) IV 510 mg   Labs in ~ 3 weeks: iron/TIBC, ferritin.

## 2015-06-25 NOTE — Progress Notes (Signed)
[name], MD [place] [id number]  Breast cancer of upper-outer quadrant of right female breast [facility])  Iron deficiency anemia due to chronic blood loss - Plan: Ferritin, Iron and TIBC  CURRENT THERAPY: Beginning AC followed by T systemic chemotherapy today, 06/25/2015.   INTERVAL HISTORY: Brittney [name] 45 y.o. 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 Anthony Sar on 05/13/2015. AND Iron deficiency anemia, poor tolerance to PO supplementation.    Breast cancer of upper-outer quadrant of right female breast ([place])   04/29/2015 Initial Biopsy upper outer quadrant mass R breast 2.9 cm by ultrasound, high grade invasive ductal carcinoma ER< 1%, PR 1%, HER -2 not amplified, Ki-67 66%   05/13/2015 Cancer Staging pT1cN0   05/13/2015 Surgery lumpectomy and sentinel node biopsies X 3, 2 cm tumor, 3 negative nodes    05/21/2015 Imaging CT [facility] with postsurgical changes related to R breast lympectomy and R axillary LN dissection, no findings suspicious for metastatic disease   06/12/2015 Procedure Port placement by Dr. Anthony Sar   06/17/2015 Imaging MUGA- Normal left ventricular ejection fraction equal to 62.9%.   06/25/2015 -  Chemotherapy AC x 4   Today is her first treatment. She is anxious about that. We reviewed all the side effects associated with Adriamycin/Cytoxan. She is advised to call us with any difficulties or issues at home related to chemotherapy. She is concerned about ulcerative colitis flare in the setting of systemic chemotherapy. We'll be vigilant for this and she will call us with any issues.  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 She was due to start this the other day. 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 We can always readdress this in a few months. She is agreeable with this plan. She knows that she is talked her professors and they are reasonable regarding this. She has requested a letter stating this information. One was completed today.  She denies any active complaints today. Her incision site is nearly completely healed with minimal clear drainage medially.  Review of Systems  Constitutional: Negative.  Negative for fever, chills and weight loss.  HENT: Negative.   Eyes: Negative.   Respiratory: Negative.   Gastrointestinal: Negative.   Genitourinary: Negative.   Musculoskeletal: Negative.   Skin: Negative.   Neurological: Negative.   Endo/Heme/Allergies: Negative.   Psychiatric/Behavioral: Negative.     Past Medical History  Diagnosis Date  . 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 Diabetes mellitus ([place])     Type 1 over 15 yrs  . Environmental allergies   . GERD (gastroesophageal reflux disease)   . Breast cancer ([place])   . Breast cancer of upper-outer quadrant of right female breast ([place]) 06/11/2015    Triple negative, 2 cm   . Iron deficiency anemia due to chronic blood loss 06/12/2015    Past Surgical History  Procedure Laterality Date  . Cesarean section    . Dilation and curettage of uterus      x 2 for miscarriages  . Flexible sigmoidoscopy    . Esophagogastroduodenoscopy    . Flexible sigmoidoscopy  07/27/2011  Procedure: FLEXIBLE SIGMOIDOSCOPY;  Surgeon: Rogene [place], MD;  Location: [facility];  Service: Endoscopy;  Laterality: N/A;  100  . Colonoscopy N/A 10/09/2013    Procedure: COLONOSCOPY;  Surgeon: Rogene [place], MD;  Location: [facility];  Service: Endoscopy;  Laterality: N/A;  100    Family History  Problem Relation Age of Onset  . Colon cancer Cousin     Social History   Social History  . Marital Status: Single    Spouse Name: N/A  . Number  of Children: N/A  . Years of Education: N/A   Social History Main Topics  . Smoking status: Never Smoker   . Smokeless tobacco: Never Used  . Alcohol Use: No  . Drug Use: No  . Sexual Activity: Not Asked   Other Topics Concern  . None   Social History Narrative     PHYSICAL EXAMINATION  ECOG PERFORMANCE STATUS: 0 - Asymptomatic  Filed Vitals:   06/25/15 0858  BP: 137/85  Pulse: 65  Temp: 98.4 F (36.9 C)  Resp: 16    GENERAL:alert, no distress, well nourished, well developed, comfortable, cooperative, obese, smiling and unaccompanied SKIN: skin color, texture, turgor are normal, no rashes or significant lesions HEAD: Normocephalic, No masses, lesions, tenderness or abnormalities EYES: normal, Conjunctiva are pink and non-injected EARS: External ears normal OROPHARYNX:lips, buccal mucosa, and tongue normal and mucous membranes are moist  NECK: supple, trachea midline LYMPH:  not examined BREAST:right lumpectomy site is nearly completely healed and closed.  Most medially, there is a small open area measuring less than 1 cm in size with clear drainage. LUNGS: clear to auscultation and percussion HEART: regular rate & rhythm, no murmurs, no gallops, S1 normal and S2 normal ABDOMEN:abdomen soft, obese and normal bowel sounds BACK: Back symmetric, no curvature. EXTREMITIES:less then 2 second capillary refill, no joint deformities, effusion, or inflammation, no skin discoloration, no cyanosis, positive findings:  edema trace bilateral pitting ankle edema  NEURO: alert & oriented x 3 with fluent speech, no focal motor/sensory deficits, gait normal   LABORATORY DATA: CBC    Component Value Date/Time   WBC 4.0 06/25/2015 1025   RBC 3.76* 06/25/2015 1025   HGB 10.4* 06/25/2015 1025   HCT 31.5* 06/25/2015 1025   PLT 172 06/25/2015 1025   MCV 83.8 06/25/2015 1025   MCH 27.7 06/25/2015 1025   MCHC 33.0 06/25/2015 1025   RDW 15.4 06/25/2015 1025   LYMPHSABS 1.7 06/25/2015  1025   MONOABS 0.3 06/25/2015 1025   EOSABS 0.0 06/25/2015 1025   BASOSABS 0.0 06/25/2015 1025      Chemistry      Component Value Date/Time   NA 137 06/25/2015 1025   K 3.8 06/25/2015 1025   CL 105 06/25/2015 1025   CO2 28 06/25/2015 1025   BUN 8 06/25/2015 1025   CREATININE 0.38* 06/25/2015 1025      Component Value Date/Time   CALCIUM 8.6* 06/25/2015 1025   ALKPHOS 62 06/25/2015 1025   AST 18 06/25/2015 1025   ALT 16 06/25/2015 1025   BILITOT 1.1 06/25/2015 1025        PENDING LABS:   RADIOGRAPHIC STUDIES:  Nm Cardiac Muga Rest  06/17/2015  CLINICAL DATA:  Right-sided breast cancer. EXAM: NUCLEAR MEDICINE CARDIAC BLOOD POOL IMAGING (MUGA) TECHNIQUE: Cardiac multi-gated acquisition was performed at rest following intravenous injection of Tc-62mlabeled red blood cells. RADIOPHARMACEUTICALS:  25.0 mCi Tc-996mDP in-vitro labeled red blood cells IV COMPARISON:  None. FINDINGS: The  left ventricular ejection fraction is equal to 62.9%. On 10/11/2012 this equaled 57%. Normal left ventricular wall motion. IMPRESSION: 1. Normal left ventricular ejection fraction equal to 62.9%. Electronically Signed   By: Kerby Moors M.D.   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 Anthony Sar on 05/13/2015.  Beginning [facility] chemotherapy x 4 on 06/25/2015 followed by T.  Oncology history is updated.  Staging in [facility] problem list is completed as well.  Her incision site from lumpectomy is nearly 100% closed/healed with a less than 1 cm open area most medially with clear drainage.  Pre-treatment labs today: CBC diff, CMET.  I personally reviewed and went over laboratory results with the patient.  The results are noted within this dictation.  Labs meet treatment paramaters and  therefore, we will proceed with the start of chemotherapy today as planned.  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  Return for Nadir check in 1 week as well, as scheduled.  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  Oncology Flowsheet 06/22/2015  ferumoxytol [facility]) IV 510 mg   Labs in ~ 3 weeks: iron/TIBC, ferritin.    ORDERS PLACED FOR THIS ENCOUNTER: Orders Placed This Encounter  Procedures  . Ferritin  . Iron and TIBC    MEDICATIONS PRESCRIBED THIS ENCOUNTER: Meds ordered this encounter  Medications  . oxyCODONE-acetaminophen (PERCOCET/ROXICET) 5-325 MG tablet    Sig: Take 1 tablet by mouth every 4 (four) hours as needed for severe pain.    THERAPY PLAN:  Adjuvant chemotherapy consisting of AC x 4 in a dose dense fashion followed by weekly Paclitaxel x 12.  She will then undergo XRT.  All questions were answered. The patient knows to call the clinic with any problems, questions or concerns. We can certainly see the patient much sooner if necessary.  Patient and plan discussed with Dr. Ancil Linsey and she is in agreement with the aforementioned.   This note is electronically signed by: Doy Mince 06/25/2015 1:53 PM

## 2015-06-25 NOTE — Assessment & Plan Note (Addendum)
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 Anthony Sar on 05/13/2015.  Beginning [facility] chemotherapy x 4 on 06/25/2015 followed by T.  Oncology history is updated.  Staging in [facility] problem list is completed as well.  Her incision site from lumpectomy is nearly 100% closed/healed with a less than 1 cm open area most medially with clear drainage.  Pre-treatment labs today: CBC diff, CMET.  I personally reviewed and went over laboratory results with the patient.  The results are noted within this dictation.  Labs meet treatment paramaters and therefore, we will proceed with the start of chemotherapy today as planned.  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  Return for Nadir check in 1 week as well, as scheduled.

## 2015-06-26 ENCOUNTER — Other Ambulatory Visit (INDEPENDENT_AMBULATORY_CARE_PROVIDER_SITE_OTHER): Payer: Self-pay | Admitting: Internal Medicine

## 2015-06-26 NOTE — Progress Notes (Signed)
24 hour follow up- pt states that she was very nauseated yesterday after chemotherapy. I recommended taking nausea medication around the clock for a few days after chemotherapy.  She is feeling much better today.

## 2015-06-29 DIAGNOSIS — IMO0002 Reserved for concepts with insufficient information to code with codable children: Secondary | ICD-10-CM | POA: Insufficient documentation

## 2015-07-03 ENCOUNTER — Encounter (HOSPITAL_COMMUNITY): Payer: Medicaid Other

## 2015-07-03 ENCOUNTER — Encounter (HOSPITAL_BASED_OUTPATIENT_CLINIC_OR_DEPARTMENT_OTHER): Payer: Medicaid Other | Admitting: Hematology & Oncology

## 2015-07-03 ENCOUNTER — Encounter (HOSPITAL_COMMUNITY): Payer: Self-pay | Admitting: Hematology & Oncology

## 2015-07-03 VITALS — BP 122/76 | HR 75 | Temp 98.1°F | Resp 18 | Wt 171.3 lb

## 2015-07-03 DIAGNOSIS — C50411 Malignant neoplasm of upper-outer quadrant of right female breast: Secondary | ICD-10-CM | POA: Diagnosis not present

## 2015-07-03 DIAGNOSIS — L0232 Furuncle of buttock: Secondary | ICD-10-CM | POA: Diagnosis not present

## 2015-07-03 DIAGNOSIS — R110 Nausea: Secondary | ICD-10-CM

## 2015-07-03 DIAGNOSIS — L0233 Carbuncle of buttock: Secondary | ICD-10-CM

## 2015-07-03 DIAGNOSIS — D509 Iron deficiency anemia, unspecified: Secondary | ICD-10-CM

## 2015-07-03 LAB — COMPREHENSIVE METABOLIC PANEL
ALBUMIN: 3.6 g/dL (ref 3.5–5.0)
ALK PHOS: 79 U/L (ref 38–126)
ALT: 13 U/L — AB (ref 14–54)
ANION GAP: 5 (ref 5–15)
AST: 15 U/L (ref 15–41)
BILIRUBIN TOTAL: 0.4 mg/dL (ref 0.3–1.2)
BUN: 7 mg/dL (ref 6–20)
CALCIUM: 8.6 mg/dL — AB (ref 8.9–10.3)
CO2: 29 mmol/L (ref 22–32)
CREATININE: 0.48 mg/dL (ref 0.44–1.00)
Chloride: 105 mmol/L (ref 101–111)
GFR calc non Af Amer: >60 mL/min (ref >60)
GLUCOSE: 139 mg/dL — AB (ref 65–99)
Potassium: 3.8 mmol/L (ref 3.5–5.1)
Sodium: 139 mmol/L (ref 135–145)
TOTAL PROTEIN: 6.4 g/dL — AB (ref 6.5–8.1)

## 2015-07-03 LAB — CBC WITH DIFFERENTIAL/PLATELET
Basophils Absolute: 0 10*3/uL (ref 0.0–0.1)
Basophils Relative: 0 %
Eosinophils Absolute: 0 10*3/uL (ref 0.0–0.7)
Eosinophils Relative: 0 %
HEMATOCRIT: 32.2 % — AB (ref 36.0–46.0)
HEMOGLOBIN: 10.5 g/dL — AB (ref 12.0–15.0)
LYMPHS ABS: 1.2 10*3/uL (ref 0.7–4.0)
Lymphocytes Relative: 43 %
MCH: 27.3 pg (ref 26.0–34.0)
MCHC: 32.6 g/dL (ref 30.0–36.0)
MCV: 83.9 fL (ref 78.0–100.0)
MONOS PCT: 16 %
Monocytes Absolute: 0.4 10*3/uL (ref 0.1–1.0)
NEUTROS ABS: 1.1 10*3/uL — AB (ref 1.7–7.7)
NEUTROS PCT: 41 %
Platelets: 180 10*3/uL (ref 150–400)
RBC: 3.84 MIL/uL — AB (ref 3.87–5.11)
RDW: 14.6 % (ref 11.5–15.5)
WBC: 2.7 10*3/uL — ABNORMAL LOW (ref 4.0–10.5)

## 2015-07-03 MED ORDER — SODIUM CHLORIDE 0.9 % IV SOLN: INTRAVENOUS | Status: DC

## 2015-07-03 MED ORDER — PROMETHAZINE HCL 25 MG PO TABS: 25.0000 mg | ORAL_TABLET | Freq: Four times a day (QID) | ORAL | Status: DC | PRN

## 2015-07-03 MED ORDER — PALONOSETRON HCL INJECTION 0.25 MG/5ML
0.2500 mg | Freq: Once | INTRAVENOUS | Status: AC
  Administered 2015-07-03: 0.25 mg via INTRAVENOUS
  Filled 2015-07-03: qty 5

## 2015-07-03 MED ORDER — SODIUM CHLORIDE 0.9 % IV SOLN
INTRAVENOUS | Status: DC
  Administered 2015-07-03: 10:00:00 via INTRAVENOUS

## 2015-07-03 MED ORDER — PALONOSETRON HCL INJECTION 0.25 MG/5ML: 0.2500 mg | Freq: Once | INTRAVENOUS | Status: DC

## 2015-07-03 MED ORDER — LORAZEPAM 2 MG/ML IJ SOLN
0.5000 mg | Freq: Once | INTRAMUSCULAR | Status: AC
  Administered 2015-07-03: 0.5 mg via INTRAVENOUS
  Filled 2015-07-03: qty 1

## 2015-07-03 MED ORDER — MUPIROCIN 2 % EX OINT: 1.0000 "application " | TOPICAL_OINTMENT | Freq: Two times a day (BID) | CUTANEOUS | Status: DC

## 2015-07-03 MED ORDER — HEPARIN SOD (PORK) LOCK FLUSH 100 UNIT/ML IV SOLN
500.0000 [IU] | Freq: Once | INTRAVENOUS | Status: AC
  Administered 2015-07-03: 500 [IU] via INTRAVENOUS

## 2015-07-03 MED ORDER — LORAZEPAM 0.5 MG PO TABS: 0.5000 mg | ORAL_TABLET | Freq: Three times a day (TID) | ORAL | Status: DC

## 2015-07-03 MED ORDER — ACYCLOVIR 400 MG PO TABS: 400.0000 mg | ORAL_TABLET | Freq: Three times a day (TID) | ORAL | Status: DC

## 2015-07-03 MED ORDER — LORAZEPAM 2 MG/ML IJ SOLN: 0.5000 mg | Freq: Once | INTRAMUSCULAR | Status: DC

## 2015-07-03 NOTE — Patient Instructions (Addendum)
[place] at [facility] Discharge Instructions  RECOMMENDATIONS MADE BY THE CONSULTANT AND ANY TEST RESULTS WILL BE SENT TO YOUR REFERRING PHYSICIAN.  Sitz bath in hot water twice daily for next 4 to 5 days. If area on L buttock gets worse call on Monday.  I have given you a script for bactroban, apply three times daily.  We discussed your new nausea medications.  Remember to call or go to the ED with fever of 100.5.  Return to the clinic for your next scheduled chemo treatment and lab work next week. Prescription for Ativan provided today. Other prescriptions sent to your pharmacy per Dr. Whitney Muse. Call the clinic with any questions or concerns.   Thank you for choosing [place] at [facility] to provide your oncology and hematology care.  To afford each patient quality time with our provider, please arrive at least 15 minutes before your scheduled appointment time.   Beginning January 23rd 2017 lab work for the [facility] will be done in the  Main lab at [facility] on 1st floor. If you have a lab appointment with the [facility] please come in thru the  Main Entrance and check in at the main information desk  You need to re-schedule your appointment should you arrive 10 or more minutes late.  We strive to give you quality time with our providers, and arriving late affects you and other patients whose appointments are after yours.  Also, if you no show three or more times for appointments you may be dismissed from the clinic at the providers discretion.     Again, thank you for choosing [facility].  Our hope is that these requests will decrease the amount of time that you wait before being seen by our physicians.       _____________________________________________________________  Should you have questions after your visit to [facility], please contact our office at [phone] between the hours of 8:30 a.m.  and 4:30 p.m.  Voicemails left after 4:30 p.m. will not be returned until the following business day.  For prescription refill requests, have your pharmacy contact our office.         Resources For Cancer Patients and their Caregivers ? American Cancer Society: Can assist with transportation, wigs, general needs, runs Look Good Feel Better.        [phone] ? Cancer Care: Provides financial assistance, online support groups, medication/co-pay assistance.  1-800-813-HOPE 872-111-3198) ? [place] Assists [place] cancer patients and their families through emotional , educational and financial support.  705-010-0348 ? [place] DSS Where to apply for food stamps, Medicaid and utility assistance. 205-444-7610 ? RCATS: Transportation to medical appointments. (608)614-7860 ? Social Security Administration: May apply for disability if have a Stage IV cancer. (505)616-3512 (204)554-3590 ? [employer], Disability and Transit Services: Assists with nutrition, care and transit needs. [place] Support Programs: @10RELATIVEDAYS @ > Cancer Support Group  2nd Tuesday of the month 1pm-2pm, Journey Room  > Creative Journey  3rd Tuesday of the month 1130am-1pm, Journey Room  > Look Good Feel Better  1st Wednesday of the month 10am-12 noon, Journey Room (Call [place] to register [phone])

## 2015-07-03 NOTE — Progress Notes (Signed)
[name]  Progress Note  Patient Care Team: Brittney Bis, MD as PCP - General (Unknown Physician Specialty)  CHIEF COMPLAINTS:     Breast cancer of upper-outer quadrant of right female breast ([place])   04/29/2015 Initial Biopsy upper outer quadrant mass R breast 2.9 cm by ultrasound, high grade invasive ductal carcinoma ER< 1%, PR 1%, HER -2 not amplified, Ki-67 66%   05/13/2015 Cancer Staging pT1cN0   05/13/2015 Surgery lumpectomy and sentinel node biopsies X 3, 2 cm tumor, 3 negative nodes    05/21/2015 Imaging CT [facility]/P [facility] with postsurgical changes related to R breast lympectomy and R axillary LN dissection, no findings suspicious for metastatic disease   06/12/2015 Procedure Port placement by Dr. Anthony [name]   06/17/2015 Imaging [facility]GA- Normal left ventricular ejection fraction equal to 62.9%.   06/25/2015 -  Chemotherapy AC x 4     HISTORY OF PRESENTING ILLNESS:  Brittney [name] 45 y.o. 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  She has had a radiation oncology consult with Dr. Pablo [name] and is here today to discuss chemotherapy.  The patient saw Dr. Tressie [name] in consultation at [facility]. She had follow-up on 06/02/2015 where chemotherapy was addressed. The patient notes she expressed concern about the drug "taxotere" given commercials about permanent hair loss. Per Dr. Jaclyn [name] records:  Her biggest concern about chemotherapy is permanent loss of hair. She has fairly thin hair and a significant receding hairline already. I think she is certainly a candidate for incomplete hair regeneration. Nevertheless she clearly needs chemotherapy for her high grade breast cancer Her biggest concern is permanent hair loss. She has somewhat thin hair anyway. She of course has this immune disorder for the past consistent with ulcerative colitis times many years.  We went over the gold standard regimen which is  Adriamycin/Cytoxan followed by Taxol in a dose dense fashion. The other option might be FEC 6 cycles in a dose dense fashion and the third possibility would be potentially carboplatin and Taxotere dose dense to every 3 weeks for 6 cycles.  I suspect the second and third possibilities have the least amount of significant hair loss. With her immune disorder however I am not certain that either regimen would be perfect for regrowth of her hair.   She sees Brittney [name] for GI, regarding her ulcerative colitis. She has had this since October of 1997, and confirms having had several colonoscopies. Her family doctor currently manages her diabetes, Brittney [name].   Brittney [name] is unaccompanied.   Since our last visit, the patient has lost 6 lbs. She explains the weight loss may be due to a "slight flare up of my ulcerative cholitis". After treatment it seemed like my digestive system was not too happy. When she first left, she felt good. Around 5pm she felt nauseated and took an anti nausea pill, not feeling better until around midnight. She felt much better the following morning. Since then, nausea has been coming and going. She denies any vomiting. She has been able to eat when there isn't much going on.  She feels a little nauseous today but attributes it to not really eating today, only having had a candy bar not long ago.  She experienced some cramping with treatment. At first, she had some constipation but none currently. She denies any indigestion or stomach burning.   After the Neulasta, she experienced some burning at the back  of her throat and it happened only after she starting eating. This felt like a burning sensation and lasted about an hour. She wants to say that she ate lunch and it happened at dinner too. It did not persist, and went away.   She reports the bottom of her nose feels very raw. This has been going on all week. She denies any rawness in her mouth.   She developed something on  her arm that felt like a bite, then it progressed into a blister. These have developed on her arms, legs, lower part of her left buttock, and one on the lower part of the lips of her vagina. These are very painful and itch. She had to take a hydrocodone to "even get around yesterday". She does not understand why these "blisters" are happening.   She did not have as much leg pain after the Neulasta, but as the week went on it feels as if she is arthritic in both knees.  She continues to not sleep well, even with taking the melatonin.  She takes Zovirax 200 mg once daily.  MEDICAL HISTORY:  Past Medical History  Diagnosis Date  . 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) ([place])   . Diabetes mellitus ([place])     Type 1 over 15 yrs  . Environmental allergies   . GERD (gastroesophageal reflux disease)   . Breast cancer ([place])   . Breast cancer of upper-outer quadrant of right female breast ([place]) 06/11/2015    Triple negative, 2 cm   . Iron deficiency anemia due to chronic blood loss 06/12/2015    SURGICAL HISTORY: Past Surgical History  Procedure Laterality Date  . Cesarean section    . Dilation and curettage of uterus      x 2 for miscarriages  . Flexible sigmoidoscopy    . Esophagogastroduodenoscopy    . Flexible sigmoidoscopy  07/27/2011    Procedure: FLEXIBLE SIGMOIDOSCOPY;  Surgeon: Brittney Houston, MD;  Location: [facility];  Service: Endoscopy;  Laterality: N/A;  100  . Colonoscopy N/A 10/09/2013    Procedure: COLONOSCOPY;  Surgeon: Brittney Houston, MD;  Location: [facility] END[facility];  Service: Endoscopy;  Laterality: N/A;  100    SOCIAL HISTORY: Social History   Social History  . Marital Status: Single    Spouse Name: N/A  . Number of Children: N/A  . Years of Education: N/A   Occupational History  . Not on file.   Social History Main Topics  . Smoking status: Never Smoker   . Smokeless tobacco: Never Used  . Alcohol Use: No  . Drug Use: No  . Sexual Activity: Not on file     Other Topics Concern  . Not on file   Social History Narrative   She is single. Has a 37 year old daughter. Aged 42. Works in home health care. Also going to school. Has to sit out her very last term in order to do her chemotherapy. Working toward RNA/CNA.  She does not smoke.  She loves to read, spend time with her family. She used to like to shop but doesnm't have a lot of money to spend now, so it's at the bottom of the list.   FAMILY HISTORY: Family History  Problem Relation Age of Onset  . Colon cancer Cousin    Mom diagnosed in 2013 with non-hodgkin's lymphoma. Had chemotherapy, radiation, more chemotherapy, and stem-cell at [facility] using her cells. Her mother has been in remission since. Mother is  aged 64 this month (June 20th, 2017). Father is 15 as well; has high blood pressure. PTSD. Has been treated at the [facility] in [place]. She believes he has high cholesterol too, like her mother. Otherwise, he is "alright I guess."  Has an older brother, and a younger sister. No one else in her immediate family with cancer.   ALLERGIES:  is allergic to ace inhibitors; adhesive; pravastatin sodium; statins; peanuts; and shellfish allergy.  MEDICATIONS:  Current Outpatient Prescriptions  Medication Sig Dispense Refill  . acetaminophen (TYLENOL) 500 MG tablet Take 1,000 mg by mouth every 6 (six) hours as needed. For pain    . acyclovir (ZOVIRAX) 200 MG capsule Take 200 mg by mouth daily.     [name] albuterol (PROVENTIL HFA;VENTOLIN HFA) 108 (90 Base) MCG/ACT inhaler Inhale into the lungs every 6 (six) hours as needed for wheezing or shortness of breath.    . APRISO 0.375 g 24 hr capsule TAKE FOUR (4) CAPSULES BY MOUTH EVERY MORNING 120 capsule 5  . Cyclophosphamide (CYTOXAN IJ) Inject as directed. To begin 06/25/15 and to be given every 14 days x 4 cycles    . DOXOrubicin HCl (ADRIAMYCIN IV) Inject into the vein. To begin 06/25/15 and to be given every 14 days x 4 cycles    .  EPINEPHrine (EPIPEN) 0.3 mg/0.3 mL SOAJ injection Inject into the muscle as needed.    . gabapentin (NEURONTIN) 100 MG capsule Take 100 mg by mouth 3 (three) times daily.     [name] HYDROcodone-acetaminophen (NORCO/VICODIN) 5-325 MG tablet Take 1 tablet by mouth every 4 (four) hours as needed for moderate pain.    [name] insulin glargine (LANTUS) 100 UNIT/ML injection Inject 20 Units into the skin at bedtime.     . insulin lispro protamine-insulin lispro (HUMALOG 50/50) (50-50) 100 UNIT/ML SUSP Inject 4 Units into the skin 4 (four) times daily. With each meal    . iron polysaccharides (NIFEREX) 150 MG capsule Take 150 mg by mouth daily.    [name] lidocaine-prilocaine (EMLA) cream Apply a quarter size amount to port site 1 hour prior to chemo. Do not rub in. Cover with plastic wrap. 30 g 3  . loratadine (CLARITIN) 10 MG tablet TAKE ONE TABLET BY MOUTH DAILY FOR HIVES.  6  . Melatonin 5 MG TABS Take 5 mg by mouth at bedtime.    . mometasone (NASONEX) 50 MCG/ACT nasal spray Place 2 sprays into the nose daily as needed.     [name] omeprazole (PRILOSEC) 20 MG capsule Take 20 mg by mouth 2 (two) times daily before a meal.     . ondansetron (ZOFRAN ODT) 8 MG disintegrating tablet Take 1 tablet (8 mg total) by mouth every 8 (eight) hours as needed for nausea or vomiting. 30 tablet 2  . pegfilgrastim (NEULASTA ONPRO) 6 MG/0.6ML injection Inject 6 mg into the skin once. Inject via provided programmed delivery device. (to begin 27 hours after the completion of AC chemotherapy)    . polyethylene glycol powder (GLYCOLAX/MIRALAX) powder Take 8.5 g by mouth daily. 255 g 5  . prochlorperazine (COMPAZINE) 10 MG tablet Take 1 tablet (10 mg total) by mouth every 6 (six) hours as needed for nausea or vomiting. 30 tablet 2   No current facility-administered medications for this visit.    Review of Systems  Constitutional: Negative.  Negative for fever, chills, weight loss and malaise/fatigue.  HENT: Negative.  Negative for congestion,  hearing loss, nosebleeds, sore throat and tinnitus.   Eyes: Negative.  Negative for blurred vision, double vision, pain and discharge.  Respiratory: Negative.  Negative for cough, hemoptysis, sputum production, shortness of breath and wheezing.   Cardiovascular: Negative.  Negative for chest pain, palpitations, claudication, leg swelling and PND.  Gastrointestinal: Negative.  Negative for heartburn, nausea, vomiting, abdominal pain, diarrhea, constipation, blood in stool and melena.  Genitourinary: Negative.  Negative for dysuria, urgency, frequency and hematuria.  Musculoskeletal: Negative.  Negative for myalgias, joint pain and falls.  Skin: Negative.  Negative for itching and rash.  Neurological: Negative.  Negative for dizziness, tingling, tremors, sensory change, speech change, focal weakness, seizures, loss of consciousness, weakness and headaches.  Endo/Heme/Allergies: Negative.  Does not bruise/bleed easily.  Psychiatric/Behavioral: Negative.  Negative for depression, suicidal ideas, memory loss and substance abuse. The patient is not nervous/anxious and does not have insomnia.   All other systems reviewed and are negative. 14 point ROS was done and is otherwise as detailed above or in HPI   PHYSICAL EXAMINATION: ECOG PERFORMANCE STATUS: 0 - Asymptomatic  Filed Vitals:   07/03/15 0901  BP: 122/76  Pulse: 75  Temp: 98.1 F (36.7 C)  Resp: 18   Filed Weights   07/03/15 0901  Weight: 171 lb 4.8 oz (77.701 kg)    Physical Exam  Constitutional: She is oriented to person, place, and time and well-developed, well-nourished, and in no distress.  HENT:  Head: Normocephalic and atraumatic.  Nose: Nose normal.  Mouth/Throat: Oropharynx is clear and moist. No oropharyngeal exudate.  Eyes: Conjunctivae and EOM are normal. Pupils are equal, round, and reactive to light. Right eye exhibits no discharge. Left eye exhibits no discharge. No scleral icterus.  Neck: Normal range of motion.  Neck supple. No tracheal deviation present. No thyromegaly present.  Cardiovascular: Normal rate, regular rhythm and normal heart sounds.  Exam reveals no gallop and no friction rub.   No murmur heard. Pulmonary/Chest: Effort normal and breath sounds normal. She has no wheezes. She has no rales.  Abdominal: Soft. Bowel sounds are normal. She exhibits no distension and no mass. There is no tenderness. There is no rebound and no guarding.  Musculoskeletal: Normal range of motion. She exhibits no edema.  Lymphadenopathy:    She has no cervical adenopathy.  Neurological: She is alert and oriented to person, place, and time. She has normal reflexes. No cranial nerve deficit. Gait normal. Coordination normal.  Skin: Skin is warm and dry. No rash noted.  Small pinpoint lesions healed on r wrist, inner L thigh.  Small carbuncle on L buttock  Psychiatric: Mood, memory, affect and judgment normal.  Nursing note and vitals reviewed.    LABORATORY DATA: I have reviewed the data as listed. Results for Brittney [name], Brittney [name] (MRN [id number])   Ref. Range 07/03/2015 08:25  Sodium Latest Ref Range: 135-145 mmol/L 139  Potassium Latest Ref Range: 3.5-5.1 mmol/L 3.8  Chloride Latest Ref Range: 101-111 mmol/L 105  CO2 Latest Ref Range: 22-32 mmol/L 29  BUN Latest Ref Range: 6-20 mg/dL 7  Creatinine Latest Ref Range: 0.44-1.00 mg/dL 0.48  Calcium Latest Ref Range: 8.9-10.3 mg/dL 8.6 (L)  EGFR (Non-African Amer.) Latest Ref Range: >60 mL/min >60  EGFR (African American) Latest Ref Range: >60 mL/min >60  Glucose Latest Ref Range: 65-99 mg/dL 139 (H)  Anion gap Latest Ref Range: 5-15  5  Alkaline Phosphatase Latest Ref Range: 38-126 U/L 79  Albumin Latest Ref Range: 3.5-5.0 g/dL 3.6  AST Latest Ref Range: 15-41 U/L 15  ALT Latest Ref Range: 14-54  U/L 13 (L)  Total Protein Latest Ref Range: 6.5-8.1 g/dL 6.4 (L)  Total Bilirubin Latest Ref Range: 0.3-1.2 mg/dL 0.4   Results for Brittney [name], Brittney [name] (MRN  [id number])   Ref. Range 07/03/2015 08:25  WBC Latest Ref Range: 4.0-10.5 K/uL 2.7 (L)  RBC Latest Ref Range: 3.87-5.11 MIL/uL 3.84 (L)  Hemoglobin Latest Ref Range: 12.0-15.0 g/dL 10.5 (L)  HCT Latest Ref Range: 36.0-46.0 % 32.2 (L)  MCV Latest Ref Range: 78.0-100.0 fL 83.9  MCH Latest Ref Range: 26.0-34.0 pg 27.3  MCHC Latest Ref Range: 30.0-36.0 g/dL 32.6  RDW Latest Ref Range: 11.5-15.5 % 14.6  Platelets Latest Ref Range: 150-400 K/uL 180  Neutrophils Latest Units: % 41  Lymphocytes Latest Units: % 43  Monocytes Relative Latest Units: % 16  Eosinophil Latest Units: % 0  Basophil Latest Units: % 0  NEUT# Latest Ref Range: 1.7-7.7 K/uL 1.1 (L)  Lymphocyte # Latest Ref Range: 0.7-4.0 K/uL 1.2  Monocyte # Latest Ref Range: 0.1-1.0 K/uL 0.4  Eosinophils Absolute Latest Ref Range: 0.0-0.7 K/uL 0.0  Basophils Absolute Latest Ref Range: 0.0-0.1 K/uL 0.0    ASSESSMENT & PLAN:  pT1cN0 triple negative carcinoma of the R breast S/P lumpectomy and sentinel LN procedure Iron deficiency Anemia Ulcerative Colitis followed by Brittney [name] Ongoing Menses Carbuncle of L buttock   Since our last visit, the patient has lost 6 lbs. We reviewed how to use her current anti-nausea medications. I have instructed her to please call or come in if she has nausea that is not alleviated with her medications. I have written the patient a prescription for ativan to alleviate her nausea. I have also written the patient a prescription for phenergan.  The patient will receive fluids today with IV anti-emetics.   I advised for the patient to perform sitz baths to care for her L buttock. I have also written her a prescription for Bactroban ointment to apply to the affected areas. She will contact us if this area worsens.  I have increased her current Zovirax dose from 200 mg once daily to 400 mg po bid. I have instructed her to take this dose throughout the course of her chemotherapy.  She will return in 1 week  for follow up, sooner if needed.  All questions were answered. The patient knows to call the clinic with any problems, questions or concerns.  This document serves as a record of services personally performed by Ancil Linsey, MD. It was created on her behalf by Arlyce Harman, a trained medical scribe. The creation of this record is based on the scribe's personal observations and the provider's statements to them. This document has been checked and approved by the attending provider.  I have reviewed the above documentation for accuracy and completeness and I agree with the above.  This note was electronically signed.  Molli Hazard, MD  07/03/2015 9:10 AM

## 2015-07-03 NOTE — Progress Notes (Signed)
Patient tolerated infusion well.  VSS.  Patient reports complete relief from nausea.

## 2015-07-03 NOTE — Patient Instructions (Signed)
[place] at [facility] Discharge Instructions  RECOMMENDATIONS MADE BY THE CONSULTANT AND ANY TEST RESULTS WILL BE SENT TO YOUR REFERRING PHYSICIAN.  IV fluids and anti-nausea medication.    Thank you for choosing [place] at [facility] to provide your oncology and hematology care.  To afford each patient quality time with our provider, please arrive at least 15 minutes before your scheduled appointment time.   Beginning January 23rd 2017 lab work for the [facility] will be done in the  Main lab at [facility] on 1st floor. If you have a lab appointment with the [facility] please come in thru the  Main Entrance and check in at the main information desk  You need to re-schedule your appointment should you arrive 10 or more minutes late.  We strive to give you quality time with our providers, and arriving late affects you and other patients whose appointments are after yours.  Also, if you no show three or more times for appointments you may be dismissed from the clinic at the providers discretion.     Again, thank you for choosing [facility].  Our hope is that these requests will decrease the amount of time that you wait before being seen by our physicians.       _____________________________________________________________  Should you have questions after your visit to [facility], please contact our office at [phone] between the hours of 8:30 a.m. and 4:30 p.m.  Voicemails left after 4:30 p.m. will not be returned until the following business day.  For prescription refill requests, have your pharmacy contact our office.         Resources For Cancer Patients and their Caregivers ? American Cancer Society: Can assist with transportation, wigs, general needs, runs Look Good Feel Better.        220-061-6872 ? Cancer Care: Provides financial assistance, online support groups, medication/co-pay  assistance.  1-800-813-HOPE (385) 595-3261) ? [place] Assists [place] cancer patients and their families through emotional , educational and financial support.  406-791-9786 ? [place] DSS Where to apply for food stamps, Medicaid and utility assistance. 707-460-7041 ? [facility]: Transportation to medical appointments. (972)808-5929 ? Social Security Administration: May apply for disability if have a Stage IV cancer. (831)773-2411 317-787-2313 ? [employer], Disability and Transit Services: Assists with nutrition, care and transit needs. [place] Support Programs: @10RELATIVEDAYS @ > Cancer Support Group  2nd Tuesday of the month 1pm-2pm, Journey Room  > Creative Journey  3rd Tuesday of the month 1130am-1pm, Journey Room  > Look Good Feel Better  1st Wednesday of the month 10am-12 noon, Journey Room (Call [place] to register [phone])

## 2015-07-09 ENCOUNTER — Encounter (HOSPITAL_COMMUNITY): Payer: Self-pay | Admitting: Hematology & Oncology

## 2015-07-09 ENCOUNTER — Encounter (HOSPITAL_BASED_OUTPATIENT_CLINIC_OR_DEPARTMENT_OTHER): Payer: Medicaid Other

## 2015-07-09 ENCOUNTER — Inpatient Hospital Stay (HOSPITAL_COMMUNITY): Payer: Medicaid Other

## 2015-07-09 ENCOUNTER — Encounter (HOSPITAL_COMMUNITY): Payer: Medicaid Other | Attending: Hematology & Oncology | Admitting: Hematology & Oncology

## 2015-07-09 VITALS — BP 150/86 | HR 69 | Temp 98.4°F | Resp 20

## 2015-07-09 DIAGNOSIS — C50411 Malignant neoplasm of upper-outer quadrant of right female breast: Secondary | ICD-10-CM | POA: Insufficient documentation

## 2015-07-09 DIAGNOSIS — Z5189 Encounter for other specified aftercare: Secondary | ICD-10-CM | POA: Diagnosis not present

## 2015-07-09 DIAGNOSIS — R110 Nausea: Secondary | ICD-10-CM

## 2015-07-09 DIAGNOSIS — Z5111 Encounter for antineoplastic chemotherapy: Secondary | ICD-10-CM | POA: Insufficient documentation

## 2015-07-09 DIAGNOSIS — Z9889 Other specified postprocedural states: Secondary | ICD-10-CM | POA: Diagnosis not present

## 2015-07-09 DIAGNOSIS — K219 Gastro-esophageal reflux disease without esophagitis: Secondary | ICD-10-CM | POA: Insufficient documentation

## 2015-07-09 DIAGNOSIS — D509 Iron deficiency anemia, unspecified: Secondary | ICD-10-CM

## 2015-07-09 DIAGNOSIS — D500 Iron deficiency anemia secondary to blood loss (chronic): Secondary | ICD-10-CM | POA: Insufficient documentation

## 2015-07-09 DIAGNOSIS — E108 Type 1 diabetes mellitus with unspecified complications: Secondary | ICD-10-CM | POA: Diagnosis not present

## 2015-07-09 DIAGNOSIS — Z808 Family history of malignant neoplasm of other organs or systems: Secondary | ICD-10-CM | POA: Insufficient documentation

## 2015-07-09 LAB — COMPREHENSIVE METABOLIC PANEL
ALK PHOS: 81 U/L (ref 38–126)
ALT: 22 U/L (ref 14–54)
AST: 26 U/L (ref 15–41)
Albumin: 3.5 g/dL (ref 3.5–5.0)
Anion gap: 5 (ref 5–15)
BILIRUBIN TOTAL: 0.4 mg/dL (ref 0.3–1.2)
BUN: 6 mg/dL (ref 6–20)
CALCIUM: 8.6 mg/dL — AB (ref 8.9–10.3)
CO2: 30 mmol/L (ref 22–32)
CREATININE: 0.39 mg/dL — AB (ref 0.44–1.00)
Chloride: 104 mmol/L (ref 101–111)
Glucose, Bld: 147 mg/dL — ABNORMAL HIGH (ref 65–99)
Potassium: 3.6 mmol/L (ref 3.5–5.1)
Sodium: 139 mmol/L (ref 135–145)
Total Protein: 6.2 g/dL — ABNORMAL LOW (ref 6.5–8.1)

## 2015-07-09 LAB — CBC WITH DIFFERENTIAL/PLATELET
Basophils Absolute: 0 10*3/uL (ref 0.0–0.1)
Basophils Relative: 0 %
EOS PCT: 0 %
Eosinophils Absolute: 0 10*3/uL (ref 0.0–0.7)
HEMATOCRIT: 29.1 % — AB (ref 36.0–46.0)
HEMOGLOBIN: 9.3 g/dL — AB (ref 12.0–15.0)
LYMPHS ABS: 1.7 10*3/uL (ref 0.7–4.0)
LYMPHS PCT: 16 %
MCH: 27.2 pg (ref 26.0–34.0)
MCHC: 32 g/dL (ref 30.0–36.0)
MCV: 85.1 fL (ref 78.0–100.0)
Monocytes Absolute: 1.2 10*3/uL — ABNORMAL HIGH (ref 0.1–1.0)
Monocytes Relative: 12 %
NEUTROS ABS: 7.4 10*3/uL (ref 1.7–7.7)
NEUTROS PCT: 72 %
Platelets: 230 10*3/uL (ref 150–400)
RBC: 3.42 MIL/uL — AB (ref 3.87–5.11)
RDW: 15.4 % (ref 11.5–15.5)
WBC: 10.3 10*3/uL (ref 4.0–10.5)

## 2015-07-09 MED ORDER — HEPARIN SOD (PORK) LOCK FLUSH 100 UNIT/ML IV SOLN
500.0000 [IU] | Freq: Once | INTRAVENOUS | Status: AC | PRN
  Administered 2015-07-09: 500 [IU]
  Filled 2015-07-09: qty 5

## 2015-07-09 MED ORDER — SODIUM CHLORIDE 0.9 % IV SOLN
Freq: Once | INTRAVENOUS | Status: AC
  Administered 2015-07-09: 11:00:00 via INTRAVENOUS
  Filled 2015-07-09: qty 5

## 2015-07-09 MED ORDER — PALONOSETRON HCL INJECTION 0.25 MG/5ML
0.2500 mg | Freq: Once | INTRAVENOUS | Status: AC
  Administered 2015-07-09: 0.25 mg via INTRAVENOUS
  Filled 2015-07-09: qty 5

## 2015-07-09 MED ORDER — LORAZEPAM 2 MG/ML IJ SOLN
INTRAMUSCULAR | Status: AC
  Filled 2015-07-09: qty 1

## 2015-07-09 MED ORDER — CYCLOPHOSPHAMIDE CHEMO INJECTION 1 GM
600.0000 mg/m2 | Freq: Once | INTRAMUSCULAR | Status: AC
  Administered 2015-07-09: 1120 mg via INTRAVENOUS
  Filled 2015-07-09: qty 50

## 2015-07-09 MED ORDER — LORAZEPAM 2 MG/ML IJ SOLN
0.5000 mg | Freq: Once | INTRAMUSCULAR | Status: AC
  Administered 2015-07-09: 0.5 mg via INTRAVENOUS

## 2015-07-09 MED ORDER — SODIUM CHLORIDE 0.9 % IV SOLN
Freq: Once | INTRAVENOUS | Status: AC
  Administered 2015-07-09: 11:00:00 via INTRAVENOUS

## 2015-07-09 MED ORDER — PEGFILGRASTIM 6 MG/0.6ML ~~LOC~~ PSKT
6.0000 mg | PREFILLED_SYRINGE | Freq: Once | SUBCUTANEOUS | Status: AC
  Administered 2015-07-09: 6 mg via SUBCUTANEOUS
  Filled 2015-07-09: qty 0.6

## 2015-07-09 MED ORDER — SODIUM CHLORIDE 0.9% FLUSH: 10.0000 mL | INTRAVENOUS | Status: DC | PRN

## 2015-07-09 MED ORDER — DOXORUBICIN HCL CHEMO IV INJECTION 2 MG/ML
60.0000 mg/m2 | Freq: Once | INTRAVENOUS | Status: AC
  Administered 2015-07-09: 112 mg via INTRAVENOUS
  Filled 2015-07-09: qty 56

## 2015-07-09 NOTE — Progress Notes (Signed)
[place]  PROGRESS NOTE  Patient Care Team: [name], MD as PCP - General (Unknown Physician Specialty)  CHIEF COMPLAINTS/PURPOSE OF CONSULTATION:     Breast cancer of upper-outer quadrant of right female breast ([place])   04/29/2015 Initial Biopsy upper outer quadrant mass R breast 2.9 cm by ultrasound, high grade invasive ductal carcinoma ER< 1%, PR 1%, HER -2 not amplified, Ki-67 66%   05/13/2015 Cancer Staging pT1cN0   05/13/2015 Surgery lumpectomy and sentinel node biopsies X 3, 2 cm tumor, 3 negative nodes    05/21/2015 Imaging CT [facility]/P [facility] with postsurgical changes related to R breast lympectomy and R axillary LN dissection, no findings suspicious for metastatic disease   06/12/2015 Procedure Port placement by Dr. Anthony Sar   06/17/2015 Imaging [facility]- Normal left ventricular ejection fraction equal to 62.9%.   06/25/2015 -  Chemotherapy AC x 4     HISTORY OF PRESENTING ILLNESS:  Brittney [name] 45 y.o. 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  She has had a radiation oncology consult with Dr. Pablo Ledger.  The patient saw Dr. Tressie Stalker in consultation at [facility]. She had follow-up on 06/02/2015 where chemotherapy was addressed. The patient notes she expressed concern about the drug "taxotere" given commercials about permanent hair loss. Per Dr. Jaclyn Prime records:  Her biggest concern about chemotherapy is permanent loss of hair. She has fairly thin hair and a significant receding hairline already. I think she is certainly a candidate for incomplete hair regeneration. Nevertheless she clearly needs chemotherapy for her high grade breast cancer Her biggest concern is permanent hair loss. She has somewhat thin hair anyway. She of course has this immune disorder for the past consistent with ulcerative colitis times many years.  We went over the gold standard regimen which is Adriamycin/Cytoxan  followed by Taxol in a dose dense fashion. The other option might be FEC 6 cycles in a dose dense fashion and the third possibility would be potentially carboplatin and Taxotere dose dense to every 3 weeks for 6 cycles.  I suspect the second and third possibilities have the least amount of significant hair loss. With her immune disorder however I am not certain that either regimen would be perfect for regrowth of her hair.   She sees Dr. Laural Golden for GI, regarding her ulcerative colitis. She has had this since October of 1997, and confirms having had several colonoscopies. Her family doctor currently manages her diabetes, [name].   Brittney [name] returns to the [facility] today unaccompanied.  She says it's going better in general. She confirms that she's dreading getting another treatment, but felt pretty good after getting fluids. She acknowledges that she might need to plan on doing that a few days after treatment to try to feel better. She is willing to get fluids on Mondays or Tuesdays after her treatments.  As far as worries or questions today, she notes that the only thing that still seems to have her puzzled is the fact that "it's almost like every time I turn around, I'm getting some type of little place," indicating a spot on her arm, lip corner, and L chest. She says it seems like a blister which then dries up and goes away. Brittney [name] says that it happens in different areas of her body. She notes that she's had boils, but once the white part and pus comes out, it's over and done with. She  says these started out feeling like something bit her; then the next thing she knows, she winds up with a blister that "feels like it's filled with water." She denies feeling any in her mouth, though she notes that on Sunday morning, when she went to brush her teeth, her tongue was starting to bother her.   Overall, she doesn't have many complaints, saying "I think I'm good for right now."  She  confirms that she brought her genetic testing results with her in her bag today. She reports they came back negative.    MEDICAL HISTORY:  Past Medical History  Diagnosis Date  . 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) ([place])   . Diabetes mellitus ([place])     Type 1 over 15 yrs  . Environmental allergies   . GERD (gastroesophageal reflux disease)   . Breast cancer ([place])   . Breast cancer of upper-outer quadrant of right female breast ([place]) 06/11/2015    Triple negative, 2 cm   . Iron deficiency anemia due to chronic blood loss 06/12/2015    SURGICAL HISTORY: Past Surgical History  Procedure Laterality Date  . Cesarean section    . Dilation and curettage of uterus      x 2 for miscarriages  . Flexible sigmoidoscopy    . Esophagogastroduodenoscopy    . Flexible sigmoidoscopy  07/27/2011    Procedure: FLEXIBLE SIGMOIDOSCOPY;  Surgeon: Rogene Houston, MD;  Location: [facility];  Service: Endoscopy;  Laterality: N/A;  100  . Colonoscopy N/A 10/09/2013    Procedure: COLONOSCOPY;  Surgeon: Rogene Houston, MD;  Location: [facility] END[facility];  Service: Endoscopy;  Laterality: N/A;  100    SOCIAL HISTORY: Social History   Social History  . Marital Status: Single    Spouse Name: N/A  . Number of Children: N/A  . Years of Education: N/A   Occupational History  . Not on file.   Social History Main Topics  . Smoking status: Never Smoker   . Smokeless tobacco: Never Used  . Alcohol Use: No  . Drug Use: No  . Sexual Activity: Not on file   Other Topics Concern  . Not on file   Social History Narrative   She is single. Has a 69 year old daughter. Aged 53. Works in home health care. Also going to school. Has to sit out her very last term in order to do her chemotherapy. Working toward RNA/CNA.  She does not smoke.  She loves to read, spend time with her family. She used to like to shop but doesnm't have a lot of money to spend now, so it's at the bottom of the list.   FAMILY  HISTORY: Family History  Problem Relation Age of Onset  . Colon cancer Cousin    Mom diagnosed in 2013 with non-hodgkin's lymphoma. Had chemotherapy, radiation, more chemotherapy, and stem-cell at [facility] using her cells. Her mother has been in remission since. Mother is aged 58 this month (June 20th, 2017). Father is 27 as well; has high blood pressure. PTSD. Has been treated at the [facility] in [place]. She believes he has high cholesterol too, like her mother. Otherwise, he is "alright I guess."  Has an older brother, and a younger sister. No one else in her immediate family with cancer.   ALLERGIES:  is allergic to ace inhibitors; adhesive; pravastatin sodium; statins; peanuts; and shellfish allergy.  MEDICATIONS:  Current Outpatient Prescriptions  Medication Sig Dispense Refill  . acetaminophen (TYLENOL) 500  MG tablet Take 1,000 mg by mouth every 6 (six) hours as needed. For pain    . acyclovir (ZOVIRAX) 200 MG capsule Take 200 mg by mouth daily.     [name] acyclovir (ZOVIRAX) 400 MG tablet Take 1 tablet (400 mg total) by mouth 3 (three) times daily. For 7 days 21 tablet 3  . albuterol (PROVENTIL HFA;VENTOLIN HFA) 108 (90 Base) MCG/ACT inhaler Inhale into the lungs every 6 (six) hours as needed for wheezing or shortness of breath.    . APRISO 0.375 g 24 hr capsule TAKE FOUR (4) CAPSULES BY MOUTH EVERY MORNING 120 capsule 5  . Cyclophosphamide (CYTOXAN IJ) Inject as directed. To begin 06/25/15 and to be given every 14 days x 4 cycles    . DOXOrubicin HCl (ADRIAMYCIN IV) Inject into the vein. To begin 06/25/15 and to be given every 14 days x 4 cycles    . EPINEPHrine (EPIPEN) 0.3 mg/0.3 mL SOAJ injection Inject into the muscle as needed.    . gabapentin (NEURONTIN) 100 MG capsule Take 100 mg by mouth 3 (three) times daily.     [name] HYDROcodone-acetaminophen (NORCO/VICODIN) 5-325 MG tablet Take 1 tablet by mouth every 4 (four) hours as needed for moderate pain.    [name] insulin glargine  (LANTUS) 100 UNIT/ML injection Inject 20 Units into the skin at bedtime.     . insulin lispro protamine-insulin lispro (HUMALOG 50/50) (50-50) 100 UNIT/ML SUSP Inject 4 Units into the skin 4 (four) times daily. With each meal    . iron polysaccharides (NIFEREX) 150 MG capsule Take 150 mg by mouth daily.    [name] lidocaine-prilocaine (EMLA) cream Apply a quarter size amount to port site 1 hour prior to chemo. Do not rub in. Cover with plastic wrap. 30 g 3  . loratadine (CLARITIN) 10 MG tablet TAKE ONE TABLET BY MOUTH DAILY FOR HIVES.  6  . LORazepam (ATIVAN) 0.5 MG tablet Take 1 tablet (0.5 mg total) by mouth every 8 (eight) hours. For nausea or anxiety 45 tablet 0  . Melatonin 5 MG TABS Take 5 mg by mouth at bedtime.    . mometasone (NASONEX) 50 MCG/ACT nasal spray Place 2 sprays into the nose daily as needed.     . mupirocin ointment (BACTROBAN) 2 % Place 1 application into the nose 2 (two) times daily. 22 g 0  . omeprazole (PRILOSEC) 20 MG capsule Take 20 mg by mouth 2 (two) times daily before a meal.     . ondansetron (ZOFRAN ODT) 8 MG disintegrating tablet Take 1 tablet (8 mg total) by mouth every 8 (eight) hours as needed for nausea or vomiting. 30 tablet 2  . pegfilgrastim (NEULASTA ONPRO) 6 MG/0.6ML injection Inject 6 mg into the skin once. Inject via provided programmed delivery device. (to begin 27 hours after the completion of AC chemotherapy)    . polyethylene glycol powder (GLYCOLAX/MIRALAX) powder Take 8.5 g by mouth daily. 255 g 5  . prochlorperazine (COMPAZINE) 10 MG tablet Take 1 tablet (10 mg total) by mouth every 6 (six) hours as needed for nausea or vomiting. 30 tablet 2  . promethazine (PHENERGAN) 25 MG tablet Take 1 tablet (25 mg total) by mouth every 6 (six) hours as needed for nausea or vomiting. 30 tablet 2   No current facility-administered medications for this visit.   Facility-Administered Medications Ordered in Other Visits  Medication Dose Route Frequency Provider Last  Rate Last Dose  . 0.9 %  sodium chloride infusion   Intravenous Continuous  Patrici Ranks, MD 300 mL/hr at 07/03/15 1018      Review of Systems  Constitutional: Negative.  Negative for fever, chills, weight loss and malaise/fatigue.  HENT: Negative.  Negative for congestion, hearing loss, nosebleeds, sore throat and tinnitus.   Eyes: Negative.  Negative for blurred vision, double vision, pain and discharge.  Respiratory: Negative.  Negative for cough, hemoptysis, sputum production, shortness of breath and wheezing.   Cardiovascular: Negative.  Negative for chest pain, palpitations, claudication, leg swelling and PND.  Gastrointestinal: Negative.  Negative for heartburn, nausea, vomiting, abdominal pain, diarrhea, constipation, blood in stool and melena.  Genitourinary: Negative.  Negative for dysuria, urgency, frequency and hematuria.  Musculoskeletal: Negative.  Negative for myalgias, joint pain and falls.  Skin: Negative.  Negative for itching and rash.  Neurological: Negative.  Negative for dizziness, tingling, tremors, sensory change, speech change, focal weakness, seizures, loss of consciousness, weakness and headaches.  Endo/Heme/Allergies: Negative.  Does not bruise/bleed easily.  Psychiatric/Behavioral: Negative.  Negative for depression, suicidal ideas, memory loss and substance abuse. The patient is not nervous/anxious and does not have insomnia.   All other systems reviewed and are negative.  14 point ROS was done and is otherwise as detailed above or in HPI    PHYSICAL EXAMINATION: ECOG PERFORMANCE STATUS: 0 - Asymptomatic  Filed Vitals:   07/09/15 0929  BP: 134/76  Pulse: 64  Temp: 97.5 F (36.4 C)  Resp: 18   Filed Weights   07/09/15 0929  Weight: 179 lb 11.2 oz (81.511 kg)    Physical Exam  Constitutional: She is oriented to person, place, and time and well-developed, well-nourished, and in no distress.  HENT:  Head: Normocephalic and atraumatic.  Nose:  Nose normal.  Mouth/Throat: Oropharynx is clear and moist. No oropharyngeal exudate.  Eyes: Conjunctivae and EOM are normal. Pupils are equal, round, and reactive to light. Right eye exhibits no discharge. Left eye exhibits no discharge. No scleral icterus.  Neck: Normal range of motion. Neck supple. No tracheal deviation present. No thyromegaly present.  Cardiovascular: Normal rate, regular rhythm and normal heart sounds.  Exam reveals no gallop and no friction rub.   No murmur heard. Pulmonary/Chest: Effort normal and breath sounds normal. She has no wheezes. She has no rales.  Abdominal: Soft. Bowel sounds are normal. She exhibits no distension and no mass. There is no tenderness. There is no rebound and no guarding.  Musculoskeletal: Normal range of motion. She exhibits no edema.  Lymphadenopathy:    She has no cervical adenopathy.  Neurological: She is alert and oriented to person, place, and time. She has normal reflexes. No cranial nerve deficit. Gait normal. Coordination normal.  Skin: Skin is warm and dry. No rash noted.  Psychiatric: Mood, memory, affect and judgment normal.  Nursing note and vitals reviewed.    LABORATORY DATA: I have reviewed the data as listed. Results for LILLEE, MOONEYHAN (MRN [id number])   Ref. Range 07/09/2015 09:53  Sodium Latest Ref Range: 135 - 145 mmol/L 139  Potassium Latest Ref Range: 3.5 - 5.1 mmol/L 3.6  Chloride Latest Ref Range: 101 - 111 mmol/L 104  CO2 Latest Ref Range: 22 - 32 mmol/L 30  BUN Latest Ref Range: 6 - 20 mg/dL 6  Creatinine Latest Ref Range: 0.44 - 1.00 mg/dL 0.39 (L)  Calcium Latest Ref Range: 8.9 - 10.3 mg/dL 8.6 (L)  EGFR (Non-African Amer.) Latest Ref Range: >60 mL/min >60  EGFR (African American) Latest Ref Range: >60 mL/min >60  Glucose Latest Ref Range: 65 - 99 mg/dL 147 (H)  Anion gap Latest Ref Range: 5 - 15  5  Alkaline Phosphatase Latest Ref Range: 38 - 126 U/L 81  Albumin Latest Ref Range: 3.5 - 5.0 g/dL 3.5  AST  Latest Ref Range: 15 - 41 U/L 26  ALT Latest Ref Range: 14 - 54 U/L 22  Total Protein Latest Ref Range: 6.5 - 8.1 g/dL 6.2 (L)  Total Bilirubin Latest Ref Range: 0.3 - 1.2 mg/dL 0.4  WBC Latest Ref Range: 4.0 - 10.5 K/uL 10.3  RBC Latest Ref Range: 3.87 - 5.11 MIL/uL 3.42 (L)  Hemoglobin Latest Ref Range: 12.0 - 15.0 g/dL 9.3 (L)  HCT Latest Ref Range: 36.0 - 46.0 % 29.1 (L)  MCV Latest Ref Range: 78.0 - 100.0 fL 85.1  MCH Latest Ref Range: 26.0 - 34.0 pg 27.2  MCHC Latest Ref Range: 30.0 - 36.0 g/dL 32.0  RDW Latest Ref Range: 11.5 - 15.5 % 15.4  Platelets Latest Ref Range: 150 - 400 K/uL 230  Neutrophils Latest Units: % 72  Lymphocytes Latest Units: % 16  Monocytes Relative Latest Units: % 12  Eosinophil Latest Units: % 0  Basophil Latest Units: % 0  NEUT# Latest Ref Range: 1.7 - 7.7 K/uL 7.4  Lymphocyte # Latest Ref Range: 0.7 - 4.0 K/uL 1.7  Monocyte # Latest Ref Range: 0.1 - 1.0 K/uL 1.2 (H)  Eosinophils Absolute Latest Ref Range: 0.0 - 0.7 K/uL 0.0  Basophils Absolute Latest Ref Range: 0.0 - 0.1 K/uL 0.0   ASSESSMENT & PLAN:  pT1cN0 triple negative carcinoma of the R breast S/P lumpectomy and sentinel LN procedure Iron deficiency Anemia Ulcerative Colitis followed by Dr. Laural Golden Ongoing Menses Anemia Negative Genetic Testing [facility]  She is due for cycle #2 AC. Will proceed today. Labs were reviewed with the patient. I have arranged for fluids Tuesday works for her. She will let us know if she feels nausea in the meantime.  She will let me know if her skin SE worsen.   We discussed bone pain post neulasta. I advised her that she can try claritin, we discussed several other options including general pain medicaitons.   She will give [place] her genetic testing results today, which we will photocopy and keep for records. She will receive her original back. RTC 2 weeks for C#3.   All questions were answered. The patient knows to call the clinic with any problems, questions or  concerns.  This document serves as a record of services personally performed by Ancil Linsey, MD. It was created on her behalf by Toni Amend, a trained medical scribe. The creation of this record is based on the scribe's personal observations and the provider's statements to them. This document has been checked and approved by the attending provider.  I have reviewed the above documentation for accuracy and completeness and I agree with the above.  This note was electronically signed.  Molli Hazard, MD  07/09/2015 10:04 AM

## 2015-07-09 NOTE — Progress Notes (Signed)
Tolerated AC without any problems. Neulasta on pro applied to left upper arm. C/o nausea near end of Cytoxan infusion. Ativan 0.61m given IV.

## 2015-07-09 NOTE — Patient Instructions (Signed)
Return Tuesday for fluids and anti-emetics.  You do not have to see Doctor Tuesday.   Return in 2 weeks for lab work and chemo.

## 2015-07-13 ENCOUNTER — Other Ambulatory Visit (HOSPITAL_COMMUNITY): Payer: Self-pay | Admitting: *Deleted

## 2015-07-13 DIAGNOSIS — C50411 Malignant neoplasm of upper-outer quadrant of right female breast: Secondary | ICD-10-CM

## 2015-07-14 ENCOUNTER — Encounter (HOSPITAL_COMMUNITY)
Admission: RE | Admit: 2015-07-14 | Discharge: 2015-07-14 | Disposition: A | Discharge status: Home / Self Care | Payer: Medicaid Other | Source: Ambulatory Visit | Attending: Hematology & Oncology | Admitting: Hematology & Oncology

## 2015-07-14 VITALS — BP 131/79 | HR 75 | Resp 20

## 2015-07-14 DIAGNOSIS — C50411 Malignant neoplasm of upper-outer quadrant of right female breast: Secondary | ICD-10-CM | POA: Diagnosis not present

## 2015-07-14 MED ORDER — HEPARIN SOD (PORK) LOCK FLUSH 100 UNIT/ML IV SOLN
500.0000 [IU] | INTRAVENOUS | Status: AC | PRN
  Administered 2015-07-14: 500 [IU]

## 2015-07-14 MED ORDER — SODIUM CHLORIDE 0.9 % IV SOLN
INTRAVENOUS | Status: DC
  Administered 2015-07-14: 11:00:00 via INTRAVENOUS

## 2015-07-14 MED ORDER — HEPARIN SOD (PORK) LOCK FLUSH 100 UNIT/ML IV SOLN
INTRAVENOUS | Status: AC
  Filled 2015-07-14: qty 5

## 2015-07-15 ENCOUNTER — Other Ambulatory Visit (HOSPITAL_COMMUNITY): Payer: Self-pay | Admitting: *Deleted

## 2015-07-15 NOTE — Progress Notes (Signed)
Spoke with pt on 07/15/15 at 08:24 after speaking with Tom K. concerning pt's message of chest pain complaint. Gershon Mussel suggests pt goes to the ED. Upon communication  with pt she states her problem is more that she seems not to digest her food and that antacids help. Pt is told  if her discomfort changes or worsens to go to the emergency room immediately. Pt verbalized understanding.

## 2015-07-19 ENCOUNTER — Encounter (HOSPITAL_COMMUNITY): Payer: Self-pay | Admitting: Hematology & Oncology

## 2015-07-20 ENCOUNTER — Other Ambulatory Visit: Payer: Medicaid Other

## 2015-07-20 ENCOUNTER — Encounter: Payer: Medicaid Other | Admitting: Genetic Counselor

## 2015-07-23 ENCOUNTER — Encounter (HOSPITAL_COMMUNITY): Payer: Medicaid Other

## 2015-07-23 ENCOUNTER — Ambulatory Visit (HOSPITAL_COMMUNITY)
Admission: RE | Admit: 2015-07-23 | Discharge: 2015-07-23 | Disposition: A | Discharge status: Home / Self Care | Payer: Medicaid Other | Source: Ambulatory Visit | Attending: Oncology | Admitting: Oncology

## 2015-07-23 ENCOUNTER — Inpatient Hospital Stay (HOSPITAL_COMMUNITY): Payer: Medicaid Other

## 2015-07-23 ENCOUNTER — Encounter (HOSPITAL_BASED_OUTPATIENT_CLINIC_OR_DEPARTMENT_OTHER): Payer: Medicaid Other | Admitting: Oncology

## 2015-07-23 ENCOUNTER — Telehealth (HOSPITAL_COMMUNITY): Payer: Self-pay

## 2015-07-23 VITALS — BP 122/77 | HR 73 | Temp 98.2°F | Resp 16 | Wt 180.6 lb

## 2015-07-23 DIAGNOSIS — M25551 Pain in right hip: Secondary | ICD-10-CM

## 2015-07-23 DIAGNOSIS — C50411 Malignant neoplasm of upper-outer quadrant of right female breast: Secondary | ICD-10-CM

## 2015-07-23 DIAGNOSIS — Z975 Presence of (intrauterine) contraceptive device: Secondary | ICD-10-CM | POA: Insufficient documentation

## 2015-07-23 DIAGNOSIS — L089 Local infection of the skin and subcutaneous tissue, unspecified: Secondary | ICD-10-CM

## 2015-07-23 DIAGNOSIS — D500 Iron deficiency anemia secondary to blood loss (chronic): Secondary | ICD-10-CM

## 2015-07-23 DIAGNOSIS — T148XXA Other injury of unspecified body region, initial encounter: Secondary | ICD-10-CM

## 2015-07-23 MED ORDER — CEPHALEXIN 500 MG PO CAPS: 500.0000 mg | ORAL_CAPSULE | Freq: Two times a day (BID) | ORAL | Status: DC

## 2015-07-23 NOTE — Assessment & Plan Note (Addendum)
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 Anthony Sar on 05/13/2015.  Beginning [name] chemotherapy x 4 on 06/25/2015 followed by T.  Oncology history is updated.  Nursing got me prior to port-access.  There was concern about the appearance of her port-a-cath.  See picture above in physical exam section.  The most lateral aspect of her port surgical site is open with some white/yellow discharge.    She also has right lateral breast bullae appearing lesions at surgical site with subcutaneous thickness.  She notes tenderness to palpation in this area.  With these two issues, I presented the patient's case to Dr. Whitney Muse requiring her to assist with decision making and patient management.  The patient is examined by Dr. Whitney Muse.  We have decided to defer treatment x 1 week.  I have placed a call to Dr. Anthony Sar to see if he can see the patient this week for recommendations/management.  Additionally, the patient complains of right hip/groin pain exacerbated by weight bearing.  Xray of right hip/pelvis is ordered.  Return in 1 week for cycle #3 of chemotherapy.  She will return for follow-up in 3 weeks with cycle #4 of AC.    This is a shared visit with Dr. Whitney Muse.  Addendum: I was able to get in touch with Dr. Anthony Sar today at 1530 hours.  He would be glad to see her in his office within the next week.  In the interim, he agrees with starting Keflex antibiotic.  This Rx is escribed to her pharmacy and nursing will call the patient and let her know of this medication.

## 2015-07-23 NOTE — Progress Notes (Signed)
Holding Chemotherapy per MD for wound evaluation

## 2015-07-23 NOTE — Progress Notes (Signed)
[name], MD [place] [address]  Breast cancer of upper-outer quadrant of right female breast ([place]) - Plan: WOUND CULTURE  Wound infection ([place]) - Plan: WOUND CULTURE, cephALEXin (KEFLEX) 500 MG capsule  Right hip pain - Plan: DG HIP UNILAT WITH PELVIS 2-3 VIEWS RIGHT  CURRENT THERAPY: Beginning AC followed by T systemic chemotherapy today, 06/25/2015.   INTERVAL HISTORY: Brittney MANGANIELLO 45 y.o. 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 Anthony Sar on 05/13/2015. AND Iron deficiency anemia, poor tolerance to PO supplementation.    Breast cancer of upper-outer quadrant of right female breast ([place])   04/29/2015 Initial Biopsy upper outer quadrant mass R breast 2.9 cm by ultrasound, high grade invasive ductal carcinoma ER< 1%, PR 1%, HER -2 not amplified, Ki-67 66%   05/13/2015 Cancer Staging pT1cN0   05/13/2015 Surgery lumpectomy and sentinel node biopsies X 3, 2 cm tumor, 3 negative nodes    05/21/2015 Imaging CT [facility]/P [facility] with postsurgical changes related to R breast lympectomy and R axillary LN dissection, no findings suspicious for metastatic disease   06/12/2015 Procedure Port placement by Dr. Anthony Sar   06/17/2015 Imaging [facility]GA- Normal left ventricular ejection fraction equal to 62.9%.   06/25/2015 -  Chemotherapy AC x 4    She notes right hip/groin pain that increases with weight bearing.  She denies any trauma or falls.  She denies any fevers or chills.  She notes drainage at the lateral aspect of port site.  She also notes right lateral breat discomfort to palpation.  She notes that the discomfort/tenderness radiates to right axilla.  Review of Systems  Constitutional: Negative.  Negative for fever, chills and weight loss.  HENT: Negative.   Eyes: Negative.   Respiratory: Negative.   Cardiovascular: Negative.  Negative  for chest pain.  Gastrointestinal: Negative.   Genitourinary: Negative.   Musculoskeletal: Positive for joint pain (right hip/pelvis pain with weight bearing).  Skin:       Right axilla bullae at surgical site and lateral aspect of port site is open with drainage.  Neurological: Negative.   Endo/Heme/Allergies: Negative.   Psychiatric/Behavioral: Negative.     Past Medical History  Diagnosis Date  . 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) ([place])   . Diabetes mellitus ([place])     Type 1 over 15 yrs  . Environmental allergies   . GERD (gastroesophageal reflux disease)   . Breast cancer ([place])   . Breast cancer of upper-outer quadrant of right female breast ([place]) 06/11/2015    Triple negative, 2 cm   . Iron deficiency anemia due to chronic blood loss 06/12/2015    Past Surgical History  Procedure Laterality Date  . Cesarean section    . Dilation and curettage of uterus      x 2 for miscarriages  . Flexible sigmoidoscopy    . Esophagogastroduodenoscopy    . Flexible sigmoidoscopy  07/27/2011    Procedure: FLEXIBLE SIGMOIDOSCOPY;  Surgeon: Rogene Houston, MD;  Location: [facility];  Service: Endoscopy;  Laterality: N/A;  100  . Colonoscopy N/A 10/09/2013    Procedure: COLONOSCOPY;  Surgeon: Rogene Houston, MD;  Location: [facility];  Service: Endoscopy;  Laterality: N/A;  100    Family History  Problem Relation Age of Onset  . Colon cancer Cousin     Social  History   Social History  . Marital Status: Single    Spouse Name: N/A  . Number of Children: N/A  . Years of Education: N/A   Social History Main Topics  . Smoking status: Never Smoker   . Smokeless tobacco: Never Used  . Alcohol Use: No  . Drug Use: No  . Sexual Activity: Not on file   Other Topics Concern  . Not on file   Social History Narrative     PHYSICAL EXAMINATION  ECOG PERFORMANCE STATUS: 0 - Asymptomatic  Filed Vitals:   07/23/15 0931  BP: 122/77  Pulse: 73  Temp: 98.2 F (36.8 C)    Resp: 16    GENERAL:alert, no distress, well nourished, well developed, comfortable, cooperative, obese, smiling and unaccompanied SKIN: skin color, texture, turgor are normal, no rashes or significant lesions HEAD: Normocephalic, No masses, lesions, tenderness or abnormalities EYES: normal, Conjunctiva are pink and non-injected EARS: External ears normal OROPHARYNX:lips, buccal mucosa, and tongue normal and mucous membranes are moist  NECK: supple, trachea midline LYMPH:  not examined BREAST:right lumpectomy site with bullae like formations at site with some skin thickening with tenderness to palpation.     LUNGS: clear to auscultation and percussion HEART: regular rate & rhythm, no murmurs, no gallops, S1 normal and S2 normal PORT:    ABDOMEN:abdomen soft, obese and normal bowel sounds BACK: Back symmetric, no curvature. EXTREMITIES:less then 2 second capillary refill, no joint deformities, effusion, or inflammation, no skin discoloration, no cyanosis. NEURO: alert & oriented x 3 with fluent speech, no focal motor/sensory deficits, gait normal   LABORATORY DATA: CBC    Component Value Date/Time   WBC 10.3 07/09/2015 0953   RBC 3.42* 07/09/2015 0953   HGB 9.3* 07/09/2015 0953   HCT 29.1* 07/09/2015 0953   PLT 230 07/09/2015 0953   MCV 85.1 07/09/2015 0953   MCH 27.2 07/09/2015 0953   MCHC 32.0 07/09/2015 0953   RDW 15.4 07/09/2015 0953   LYMPHSABS 1.7 07/09/2015 0953   MONOABS 1.2* 07/09/2015 0953   EOSABS 0.0 07/09/2015 0953   BASOSABS 0.0 07/09/2015 0953      Chemistry      Component Value Date/Time   NA 139 07/09/2015 0953   K 3.6 07/09/2015 0953   CL 104 07/09/2015 0953   CO2 30 07/09/2015 0953   BUN 6 07/09/2015 0953   CREATININE 0.39* 07/09/2015 0953      Component Value Date/Time   CALCIUM 8.6* 07/09/2015 0953   ALKPHOS 81 07/09/2015 0953   AST 26 07/09/2015 0953   ALT 22 07/09/2015 0953   BILITOT 0.4 07/09/2015 0953        PENDING  LABS:   RADIOGRAPHIC STUDIES:  Dg Hip Unilat With Pelvis 2-3 Views Right  07/23/2015  CLINICAL DATA:  Pain for approximately 1 month. History of right-sided breast carcinoma. EXAM: DG HIP (WITH OR WITHOUT PELVIS) 2-3V RIGHT COMPARISON:  None. FINDINGS: Frontal pelvis as well as frontal and lateral right hip images were obtained. There is no demonstrable fracture or dislocation. The joint spaces appear normal. No erosive change. No blastic or lytic bone lesions are evident. There is an intrauterine device in the mid-pelvis. IMPRESSION: Intrauterine device in mid pelvis. No demonstrable fracture or dislocation. No blastic or lytic lesions. Hip and sacroiliac joints appear symmetric bilaterally. Electronically Signed   By: Lowella Grip III M.D.   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 Anthony Sar on 05/13/2015.  Beginning [facility] chemotherapy x 4 on 06/25/2015 followed by T.  Oncology history is updated.  Nursing got me prior to port-access.  There was concern about the appearance of her port-a-cath.  See picture above in physical exam section.  The most lateral aspect of her port surgical site is open with some white/yellow discharge.    She also has right lateral breast bullae appearing lesions at surgical site with subcutaneous thickness.  She notes tenderness to palpation in this area.  With these two issues, I presented the patient's case to Dr. Whitney Muse requiring her to assist with decision making and patient management.  The patient is examined by Dr. Whitney Muse.  We have decided to defer treatment x 1 week.  I have placed a call to Dr. Anthony Sar to see if he can see the patient this week for recommendations/management.  Additionally, the patient complains of right hip/groin  pain exacerbated by weight bearing.  Xray of right hip/pelvis is ordered.  Return in 1 week for cycle #3 of chemotherapy.  She will return for follow-up in 3 weeks with cycle #4 of AC.    This is a shared visit with Dr. Whitney Muse.  Addendum: I was able to get in touch with Dr. Anthony Sar today at 1530 hours.  He would be glad to see her in his office within the next week.  In the interim, he agrees with starting Keflex antibiotic.  This Rx is escribed to her pharmacy and nursing will call the patient and let her know of this medication.    ORDERS PLACED FOR THIS ENCOUNTER: Orders Placed This Encounter  Procedures  . WOUND CULTURE  . DG HIP UNILAT WITH PELVIS 2-3 VIEWS RIGHT    MEDICATIONS PRESCRIBED THIS ENCOUNTER: Meds ordered this encounter  Medications  . FLUoxetine (PROZAC) 20 MG capsule    Sig: Take 20 mg by mouth daily.  . cephALEXin (KEFLEX) 500 MG capsule    Sig: Take 1 capsule (500 mg total) by mouth 2 (two) times daily.    Dispense:  14 capsule    Refill:  0    Order Specific Question:  Supervising Provider    Answer:  Patrici Ranks [id number]    THERAPY PLAN:  Adjuvant chemotherapy consisting of AC x 4 in a dose dense fashion followed by weekly Paclitaxel x 12.  She will then undergo XRT.  All questions were answered. The patient knows to call the clinic with any problems, questions or concerns. We can certainly see the patient much sooner if necessary.  Patient and plan discussed with Dr. Ancil Linsey and she is in agreement with the aforementioned.   This note is electronically signed by: Doy Mince 07/23/2015 10:00 PM  Patient was seen and examined with Tom. On PE: R axillae is warm, erythematous, port site with small opened area at lateral aspect. No drainage at either site noted.   Plan: Keflex was started. Dr Anthony Sar will follow-up with patient prior to treatment next week.  I have recommended holding therapy this week. She is receiving  chemotherapy In a dose dense fashion and I believe to prevent complication delay X 1 week is appropriate. Patient is in agreement.  Assessment: pT1cN0 triple negative carcinoma of the R breast S/P lumpectomy and sentinel LN procedure Iron deficiency Anemia Ulcerative Colitis followed by Dr. Laural Golden  Ongoing Menses Anemia Negative Genetic Testing [facility] R axillary folliculitis vs. Early wound infection  Molli Hazard, MD

## 2015-07-23 NOTE — Telephone Encounter (Signed)
Patient was called and informed that she had Keflex called into [facility], she verbalized understanding.  She was told to take them as prescribed and to be sure to take them all.

## 2015-07-23 NOTE — Patient Instructions (Signed)
[place] at [facility] Discharge Instructions  RECOMMENDATIONS MADE BY THE CONSULTANT AND ANY TEST RESULTS WILL BE SENT TO YOUR REFERRING PHYSICIAN.  Exam done and seen today by Kirby Crigler Right hip xray today. Wound culture done Defer chemo for 7 days. Labs next week. Tom called Dr. Lockie Pares Return for chemo next week Return to see the Doctor 3 weeks for follow up with dr p Call the clinic for any concerns or questions.  Thank you for choosing [place] at [facility] to provide your oncology and hematology care.  To afford each patient quality time with our provider, please arrive at least 15 minutes before your scheduled appointment time.   Beginning January 23rd 2017 lab work for the [facility] will be done in the  Main lab at [facility] on 1st floor. If you have a lab appointment with the [place] please come in thru the  Main Entrance and check in at the main information desk  You need to re-schedule your appointment should you arrive 10 or more minutes late.  We strive to give you quality time with our providers, and arriving late affects you and other patients whose appointments are after yours.  Also, if you no show three or more times for appointments you may be dismissed from the clinic at the providers discretion.     Again, thank you for choosing [facility].  Our hope is that these requests will decrease the amount of time that you wait before being seen by our physicians.       _____________________________________________________________  Should you have questions after your visit to [facility], please contact our office at [phone] between the hours of 8:30 a.m. and 4:30 p.m.  Voicemails left after 4:30 p.m. will not be returned until the following business day.  For prescription refill requests, have your pharmacy contact our office.         Resources For Cancer Patients and  their Caregivers ? American Cancer Society: Can assist with transportation, wigs, general needs, runs Look Good Feel Better.        239-736-0567 ? Cancer Care: Provides financial assistance, online support groups, medication/co-pay assistance.  1-800-813-HOPE 603-311-8563) ? [place] Assists [place] cancer patients and their families through emotional , educational and financial support.  (407)467-6609 ? [place] DSS Where to apply for food stamps, Medicaid and utility assistance. (450)253-1139 ? RCATS: Transportation to medical appointments. (628)754-8110 ? Social Security Administration: May apply for disability if have a Stage IV cancer. ([phone] 717-732-2016 ? [employer], Disability and Transit Services: Assists with nutrition, care and transit needs. [place] Support Programs: @10RELATIVEDAYS @ > Cancer Support Group  2nd Tuesday of the month 1pm-2pm, Journey Room  > Creative Journey  3rd Tuesday of the month 1130am-1pm, Journey Room  > Look Good Feel Better  1st Wednesday of the month 10am-12 noon, Journey Room (Call [place] to register [phone])

## 2015-07-30 ENCOUNTER — Encounter (HOSPITAL_BASED_OUTPATIENT_CLINIC_OR_DEPARTMENT_OTHER): Payer: Medicaid Other

## 2015-07-30 ENCOUNTER — Encounter (HOSPITAL_COMMUNITY): Payer: Self-pay

## 2015-07-30 VITALS — BP 124/67 | HR 71 | Temp 98.7°F | Resp 18 | Wt 177.0 lb

## 2015-07-30 DIAGNOSIS — Z5189 Encounter for other specified aftercare: Secondary | ICD-10-CM | POA: Diagnosis not present

## 2015-07-30 DIAGNOSIS — R110 Nausea: Secondary | ICD-10-CM

## 2015-07-30 DIAGNOSIS — Z5111 Encounter for antineoplastic chemotherapy: Secondary | ICD-10-CM

## 2015-07-30 DIAGNOSIS — D500 Iron deficiency anemia secondary to blood loss (chronic): Secondary | ICD-10-CM

## 2015-07-30 DIAGNOSIS — C50411 Malignant neoplasm of upper-outer quadrant of right female breast: Secondary | ICD-10-CM | POA: Diagnosis not present

## 2015-07-30 LAB — COMPREHENSIVE METABOLIC PANEL
ALK PHOS: 72 U/L (ref 38–126)
ALT: 15 U/L (ref 14–54)
ANION GAP: 3 — AB (ref 5–15)
AST: 19 U/L (ref 15–41)
Albumin: 3.6 g/dL (ref 3.5–5.0)
BILIRUBIN TOTAL: 0.4 mg/dL (ref 0.3–1.2)
BUN: 10 mg/dL (ref 6–20)
CALCIUM: 8.3 mg/dL — AB (ref 8.9–10.3)
CO2: 27 mmol/L (ref 22–32)
Chloride: 110 mmol/L (ref 101–111)
Creatinine, Ser: 0.41 mg/dL — ABNORMAL LOW (ref 0.44–1.00)
Glucose, Bld: 60 mg/dL — ABNORMAL LOW (ref 65–99)
Potassium: 3.5 mmol/L (ref 3.5–5.1)
Sodium: 140 mmol/L (ref 135–145)
TOTAL PROTEIN: 6.2 g/dL — AB (ref 6.5–8.1)

## 2015-07-30 LAB — CBC WITH DIFFERENTIAL/PLATELET
Basophils Absolute: 0 10*3/uL (ref 0.0–0.1)
Basophils Relative: 0 %
Eosinophils Absolute: 0 10*3/uL (ref 0.0–0.7)
Eosinophils Relative: 0 %
HEMATOCRIT: 31.1 % — AB (ref 36.0–46.0)
HEMOGLOBIN: 10.2 g/dL — AB (ref 12.0–15.0)
LYMPHS ABS: 1 10*3/uL (ref 0.7–4.0)
Lymphocytes Relative: 17 %
MCH: 27.7 pg (ref 26.0–34.0)
MCHC: 32.8 g/dL (ref 30.0–36.0)
MCV: 84.5 fL (ref 78.0–100.0)
MONO ABS: 0.7 10*3/uL (ref 0.1–1.0)
MONOS PCT: 12 %
NEUTROS ABS: 4.1 10*3/uL (ref 1.7–7.7)
NEUTROS PCT: 71 %
Platelets: 315 10*3/uL (ref 150–400)
RBC: 3.68 MIL/uL — ABNORMAL LOW (ref 3.87–5.11)
RDW: 16.6 % — ABNORMAL HIGH (ref 11.5–15.5)
WBC: 5.7 10*3/uL (ref 4.0–10.5)

## 2015-07-30 LAB — FERRITIN: FERRITIN: 71 ng/mL (ref 11–307)

## 2015-07-30 LAB — IRON AND TIBC
IRON: 51 ug/dL (ref 28–170)
Saturation Ratios: 17 % (ref 10.4–31.8)
TIBC: 307 ug/dL (ref 250–450)
UIBC: 256 ug/dL

## 2015-07-30 MED ORDER — SODIUM CHLORIDE 0.9% FLUSH
10.0000 mL | INTRAVENOUS | Status: DC | PRN
  Administered 2015-07-30: 10 mL
  Filled 2015-07-30: qty 10

## 2015-07-30 MED ORDER — PEGFILGRASTIM 6 MG/0.6ML ~~LOC~~ PSKT
6.0000 mg | PREFILLED_SYRINGE | Freq: Once | SUBCUTANEOUS | Status: AC
  Administered 2015-07-30: 6 mg via SUBCUTANEOUS

## 2015-07-30 MED ORDER — DOXORUBICIN HCL CHEMO IV INJECTION 2 MG/ML
60.0000 mg/m2 | Freq: Once | INTRAVENOUS | Status: AC
  Administered 2015-07-30: 112 mg via INTRAVENOUS
  Filled 2015-07-30: qty 56

## 2015-07-30 MED ORDER — SODIUM CHLORIDE 0.9 % IV SOLN
600.0000 mg/m2 | Freq: Once | INTRAVENOUS | Status: AC
  Administered 2015-07-30: 1120 mg via INTRAVENOUS
  Filled 2015-07-30: qty 6

## 2015-07-30 MED ORDER — PALONOSETRON HCL INJECTION 0.25 MG/5ML
0.2500 mg | Freq: Once | INTRAVENOUS | Status: AC
  Administered 2015-07-30: 0.25 mg via INTRAVENOUS

## 2015-07-30 MED ORDER — SODIUM CHLORIDE 0.9 % IV SOLN
Freq: Once | INTRAVENOUS | Status: AC
  Administered 2015-07-30: 11:00:00 via INTRAVENOUS

## 2015-07-30 MED ORDER — PEGFILGRASTIM 6 MG/0.6ML ~~LOC~~ PSKT
PREFILLED_SYRINGE | SUBCUTANEOUS | Status: AC
  Filled 2015-07-30: qty 0.6

## 2015-07-30 MED ORDER — HEPARIN SOD (PORK) LOCK FLUSH 100 UNIT/ML IV SOLN
INTRAVENOUS | Status: AC
Start: 2015-07-30 — End: 2015-07-30
  Filled 2015-07-30: qty 5

## 2015-07-30 MED ORDER — HEPARIN SOD (PORK) LOCK FLUSH 100 UNIT/ML IV SOLN
500.0000 [IU] | Freq: Once | INTRAVENOUS | Status: AC | PRN
  Administered 2015-07-30: 500 [IU]

## 2015-07-30 MED ORDER — PALONOSETRON HCL INJECTION 0.25 MG/5ML
INTRAVENOUS | Status: AC
  Filled 2015-07-30: qty 5

## 2015-07-30 MED ORDER — SODIUM CHLORIDE 0.9 % IV SOLN
Freq: Once | INTRAVENOUS | Status: AC
  Administered 2015-07-30: 11:00:00 via INTRAVENOUS
  Filled 2015-07-30: qty 5

## 2015-07-30 NOTE — Patient Instructions (Signed)
[facility] Discharge Instructions for Patients Receiving Chemotherapy   Beginning January 23rd 2017 lab work for the [facility] will be done in the  Main lab at [facility] on 1st floor. If you have a lab appointment with the [facility] please come in thru the  Main Entrance and check in at the main information desk   Today you received the following chemotherapy agents Adriamycin and Cytoxan. Neulasta OnPro placed on left upper arm. Medication will dispense between 330 pm and 430 pm tomorrow. You may remove device after 5pm tomorrow.  To help prevent nausea and vomiting after your treatment, we encourage you to take your nausea medication as instructed.  If you develop nausea and vomiting, or diarrhea that is not controlled by your medication, call the clinic.  The clinic phone number is [phone]. Office hours are Monday-Friday 8:30am-5:00pm.  BELOW ARE SYMPTOMS THAT SHOULD BE REPORTED IMMEDIATELY:  *FEVER GREATER THAN 101.0 F  *CHILLS WITH OR WITHOUT FEVER  NAUSEA AND VOMITING THAT IS NOT CONTROLLED WITH YOUR NAUSEA MEDICATION  *UNUSUAL SHORTNESS OF BREATH  *UNUSUAL BRUISING OR BLEEDING  TENDERNESS IN MOUTH AND THROAT WITH OR WITHOUT PRESENCE OF ULCERS  *URINARY PROBLEMS  *BOWEL PROBLEMS  UNUSUAL RASH Items with * indicate a potential emergency and should be followed up as soon as possible. If you have an emergency after office hours please contact your primary care physician or go to the nearest emergency department.  Please call the clinic during office hours if you have any questions or concerns.   You may also contact the Patient Navigator at [phone] should you have any questions or need assistance in obtaining follow up care.      Resources For Cancer Patients and their Caregivers ? American Cancer Society: Can assist with transportation, wigs, general needs, runs Look Good Feel Better.        281-478-0073 ? Cancer  Care: Provides financial assistance, online support groups, medication/co-pay assistance.  1-800-813-HOPE 519 525 6756) ? [place] Assists [place] cancer patients and their families through emotional , educational and financial support.  908-726-2892 ? [place] DSS Where to apply for food stamps, Medicaid and utility assistance. 240 143 6924 ? RCATS: Transportation to medical appointments. 334-452-0572 ? Social Security Administration: May apply for disability if have a Stage IV cancer. [phone] 512-195-0950 ? [employer], Disability and Transit Services: Assists with nutrition, care and transit needs. 562-207-1892

## 2015-07-30 NOTE — Progress Notes (Signed)
Tolerated chemo well. Brittney KitchenMarliss [name] arrived today for ONPRO neulasta on body injector. See MAR for administration details. Injector in place and engaged with green light indicator on flashing. Tolerated application with out problems. Ambulatory on discharge home with family.

## 2015-08-02 ENCOUNTER — Encounter (HOSPITAL_COMMUNITY): Payer: Self-pay | Admitting: Hematology & Oncology

## 2015-08-06 ENCOUNTER — Ambulatory Visit (HOSPITAL_COMMUNITY): Payer: Medicaid Other | Admitting: Oncology

## 2015-08-06 ENCOUNTER — Inpatient Hospital Stay (HOSPITAL_COMMUNITY): Payer: Medicaid Other

## 2015-08-13 ENCOUNTER — Encounter (HOSPITAL_COMMUNITY): Payer: Medicaid Other | Attending: Adult Health | Admitting: Adult Health

## 2015-08-13 ENCOUNTER — Encounter (HOSPITAL_COMMUNITY): Payer: Medicaid Other

## 2015-08-13 ENCOUNTER — Other Ambulatory Visit (HOSPITAL_COMMUNITY): Payer: Self-pay | Admitting: Oncology

## 2015-08-13 VITALS — BP 140/69 | HR 79 | Temp 98.4°F | Resp 18 | Wt 179.0 lb

## 2015-08-13 DIAGNOSIS — D500 Iron deficiency anemia secondary to blood loss (chronic): Secondary | ICD-10-CM | POA: Diagnosis not present

## 2015-08-13 DIAGNOSIS — C50411 Malignant neoplasm of upper-outer quadrant of right female breast: Secondary | ICD-10-CM | POA: Insufficient documentation

## 2015-08-13 DIAGNOSIS — R238 Other skin changes: Secondary | ICD-10-CM | POA: Diagnosis not present

## 2015-08-13 DIAGNOSIS — Z9889 Other specified postprocedural states: Secondary | ICD-10-CM | POA: Insufficient documentation

## 2015-08-13 DIAGNOSIS — Z5189 Encounter for other specified aftercare: Secondary | ICD-10-CM | POA: Diagnosis not present

## 2015-08-13 DIAGNOSIS — Z5111 Encounter for antineoplastic chemotherapy: Secondary | ICD-10-CM | POA: Diagnosis not present

## 2015-08-13 DIAGNOSIS — K219 Gastro-esophageal reflux disease without esophagitis: Secondary | ICD-10-CM | POA: Diagnosis not present

## 2015-08-13 DIAGNOSIS — R110 Nausea: Secondary | ICD-10-CM

## 2015-08-13 DIAGNOSIS — E108 Type 1 diabetes mellitus with unspecified complications: Secondary | ICD-10-CM | POA: Insufficient documentation

## 2015-08-13 DIAGNOSIS — L089 Local infection of the skin and subcutaneous tissue, unspecified: Secondary | ICD-10-CM

## 2015-08-13 DIAGNOSIS — Z808 Family history of malignant neoplasm of other organs or systems: Secondary | ICD-10-CM | POA: Insufficient documentation

## 2015-08-13 DIAGNOSIS — T148XXA Other injury of unspecified body region, initial encounter: Secondary | ICD-10-CM

## 2015-08-13 LAB — COMPREHENSIVE METABOLIC PANEL
ALBUMIN: 3.8 g/dL (ref 3.5–5.0)
ALT: 17 U/L (ref 14–54)
AST: 21 U/L (ref 15–41)
Alkaline Phosphatase: 86 U/L (ref 38–126)
Anion gap: 5 (ref 5–15)
BUN: 6 mg/dL (ref 6–20)
CHLORIDE: 105 mmol/L (ref 101–111)
CO2: 28 mmol/L (ref 22–32)
Calcium: 8.6 mg/dL — ABNORMAL LOW (ref 8.9–10.3)
Creatinine, Ser: 0.47 mg/dL (ref 0.44–1.00)
GFR calc Af Amer: >60 mL/min (ref >60)
Glucose, Bld: 191 mg/dL — ABNORMAL HIGH (ref 65–99)
POTASSIUM: 3.6 mmol/L (ref 3.5–5.1)
SODIUM: 138 mmol/L (ref 135–145)
Total Bilirubin: 0.2 mg/dL — ABNORMAL LOW (ref 0.3–1.2)
Total Protein: 6.3 g/dL — ABNORMAL LOW (ref 6.5–8.1)

## 2015-08-13 LAB — CBC WITH DIFFERENTIAL/PLATELET
BASOS ABS: 0 10*3/uL (ref 0.0–0.1)
BASOS PCT: 0 %
EOS ABS: 0 10*3/uL (ref 0.0–0.7)
EOS PCT: 0 %
HCT: 32 % — ABNORMAL LOW (ref 36.0–46.0)
Hemoglobin: 10.4 g/dL — ABNORMAL LOW (ref 12.0–15.0)
Lymphocytes Relative: 15 %
Lymphs Abs: 1.2 10*3/uL (ref 0.7–4.0)
MCH: 27.4 pg (ref 26.0–34.0)
MCHC: 32.5 g/dL (ref 30.0–36.0)
MCV: 84.4 fL (ref 78.0–100.0)
MONO ABS: 1.1 10*3/uL — AB (ref 0.1–1.0)
Monocytes Relative: 14 %
Neutro Abs: 5.5 10*3/uL (ref 1.7–7.7)
Neutrophils Relative %: 71 %
PLATELETS: 201 10*3/uL (ref 150–400)
RBC: 3.79 MIL/uL — AB (ref 3.87–5.11)
RDW: 17.1 % — AB (ref 11.5–15.5)
WBC: 7.8 10*3/uL (ref 4.0–10.5)

## 2015-08-13 MED ORDER — HEPARIN SOD (PORK) LOCK FLUSH 100 UNIT/ML IV SOLN
INTRAVENOUS | Status: AC
  Filled 2015-08-13: qty 5

## 2015-08-13 MED ORDER — PEGFILGRASTIM 6 MG/0.6ML ~~LOC~~ PSKT
PREFILLED_SYRINGE | SUBCUTANEOUS | Status: AC
  Filled 2015-08-13: qty 0.6

## 2015-08-13 MED ORDER — PEGFILGRASTIM 6 MG/0.6ML ~~LOC~~ PSKT
6.0000 mg | PREFILLED_SYRINGE | Freq: Once | SUBCUTANEOUS | Status: AC
  Administered 2015-08-13: 6 mg via SUBCUTANEOUS

## 2015-08-13 MED ORDER — SODIUM CHLORIDE 0.9% FLUSH
10.0000 mL | INTRAVENOUS | Status: DC | PRN
  Administered 2015-08-13: 10 mL
  Filled 2015-08-13: qty 10

## 2015-08-13 MED ORDER — CYCLOPHOSPHAMIDE CHEMO INJECTION 1 GM
600.0000 mg/m2 | Freq: Once | INTRAMUSCULAR | Status: AC
  Administered 2015-08-13: 1120 mg via INTRAVENOUS
  Filled 2015-08-13: qty 50

## 2015-08-13 MED ORDER — SODIUM CHLORIDE 0.9 % IV SOLN
Freq: Once | INTRAVENOUS | Status: AC
  Administered 2015-08-13: 11:00:00 via INTRAVENOUS
  Filled 2015-08-13: qty 5

## 2015-08-13 MED ORDER — HEPARIN SOD (PORK) LOCK FLUSH 100 UNIT/ML IV SOLN
500.0000 [IU] | Freq: Once | INTRAVENOUS | Status: AC | PRN
  Administered 2015-08-13: 500 [IU]

## 2015-08-13 MED ORDER — SODIUM CHLORIDE 0.9 % IV SOLN
Freq: Once | INTRAVENOUS | Status: AC
  Administered 2015-08-13: 10:00:00 via INTRAVENOUS

## 2015-08-13 MED ORDER — PALONOSETRON HCL INJECTION 0.25 MG/5ML
INTRAVENOUS | Status: AC
  Filled 2015-08-13: qty 5

## 2015-08-13 MED ORDER — CEPHALEXIN 500 MG PO CAPS: 500.0000 mg | ORAL_CAPSULE | Freq: Two times a day (BID) | ORAL | 0 refills | Status: DC

## 2015-08-13 MED ORDER — DOXORUBICIN HCL CHEMO IV INJECTION 2 MG/ML
60.0000 mg/m2 | Freq: Once | INTRAVENOUS | Status: AC
  Administered 2015-08-13: 112 mg via INTRAVENOUS
  Filled 2015-08-13: qty 56

## 2015-08-13 MED ORDER — PALONOSETRON HCL INJECTION 0.25 MG/5ML
0.2500 mg | Freq: Once | INTRAVENOUS | Status: AC
  Administered 2015-08-13: 0.25 mg via INTRAVENOUS

## 2015-08-13 NOTE — Progress Notes (Signed)
1015 Pt reported a 1 inch open area under her right arm from previous surgical procedure 2 months ago. This area was larger about 2 weeks ago and she saw Dr. Anthony Sar who did not think it was infected but thought it was an allergic reaction to the sutures. Since then the area improved but just recently she had some pain in that area and noticed the area felt like a blister. No drainage noted. Mike Craze NP was informed and assessed the pt. Chemo was approved for today and a script for Keflex was called into [facility] per NP.      1240 Pt tolerated chemo tx well without problems and Neulasta on-pro was applied to pt's left arm.Pt was given a few non-adhesive bandages per NP to apply to open area under her right arm to keep this area dry Pt was discharged self ambulatory in satisfactory condition

## 2015-08-13 NOTE — Patient Instructions (Signed)
[facility] Discharge Instructions for Patients Receiving Chemotherapy   Beginning January 23rd 2017 lab work for the [facility] will be done in the  Main lab at [facility] on 1st floor. If you have a lab appointment with the [facility] please come in thru the  Main Entrance and check in at the main information desk   Today you received the following chemotherapy agents Adraimycin and Cytoxan. Follow-up as scheduled. Call clinic for any questions or concerns  To help prevent nausea and vomiting after your treatment, we encourage you to take your nausea medication   If you develop nausea and vomiting, or diarrhea that is not controlled by your medication, call the clinic.  The clinic phone number is [phone]. Office hours are Monday-Friday 8:30am-5:00pm.  BELOW ARE SYMPTOMS THAT SHOULD BE REPORTED IMMEDIATELY:  *FEVER GREATER THAN 101.0 F  *CHILLS WITH OR WITHOUT FEVER  NAUSEA AND VOMITING THAT IS NOT CONTROLLED WITH YOUR NAUSEA MEDICATION  *UNUSUAL SHORTNESS OF BREATH  *UNUSUAL BRUISING OR BLEEDING  TENDERNESS IN MOUTH AND THROAT WITH OR WITHOUT PRESENCE OF ULCERS  *URINARY PROBLEMS  *BOWEL PROBLEMS  UNUSUAL RASH Items with * indicate a potential emergency and should be followed up as soon as possible. If you have an emergency after office hours please contact your primary care physician or go to the nearest emergency department.  Please call the clinic during office hours if you have any questions or concerns.   You may also contact the Patient Navigator at [phone] should you have any questions or need assistance in obtaining follow up care.      Resources For Cancer Patients and their Caregivers ? American Cancer Society: Can assist with transportation, wigs, general needs, runs Look Good Feel Better.        [phone] ? Cancer Care: Provides financial assistance, online support groups, medication/co-pay assistance.   1-800-813-HOPE 406-629-1285) ? [place] Assists [place] cancer patients and their families through emotional , educational and financial support.  4840317522 ? [place] DSS Where to apply for food stamps, Medicaid and utility assistance. 820 588 4513 ? RCATS: Transportation to medical appointments. 325-877-5444 ? Social Security Administration: May apply for disability if have a Stage IV cancer. 346-757-2395 915-446-3922 ? [employer], Disability and Transit Services: Assists with nutrition, care and transit needs. [phone]

## 2015-08-13 NOTE — Progress Notes (Signed)
[name], MD [place] [address]   CURRENT THERAPY:  AC x 4 followed by Taxol x 12 systemic chemotherapy beginning, 06/25/2015.   INTERVAL HISTORY: Brittney [name] 45 y.o. 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 Anthony Sar on 05/13/2015. AND Iron deficiency anemia, poor tolerance to PO supplementation.    Breast cancer of upper-outer quadrant of right female breast ([place])   04/29/2015 Initial Biopsy    upper outer quadrant mass R breast 2.9 cm by ultrasound, high grade invasive ductal carcinoma ER< 1%, PR 1%, HER -2 not amplified, Ki-67 66%     05/13/2015 Cancer Staging    pT1cN0     05/13/2015 Surgery    lumpectomy and sentinel node biopsies X 3, 2 cm tumor, 3 negative nodes      05/21/2015 Imaging    CT [facility]P [facility] with postsurgical changes related to R breast lympectomy and R axillary LN dissection, no findings suspicious for metastatic disease     06/12/2015 Procedure    Port placement by Dr. Anthony Sar     06/17/2015 Imaging    MUGA- Normal left ventricular ejection fraction equal to 62.9%.     06/25/2015 -  Chemotherapy    AC x 4      She was over 1 hour late to her scheduled visit today, so she was seen only briefly in infusion today during Cycle #4 of Adriamycin/Cytoxan.  Cycle #3 was delayed x 1 week due to wound/skin infection and (R) hip pain; cycle #3 completed on 07/30/15.  She denies any fevers or chills.  She notes (R) axilla tenderness with "big bump" to this area.  She states that it has been there for quite some time and that Dr. Anthony Sar saw her recently for this problem.  She tells me it was getting smaller with recent oral antibiotics, "but now it is back, but it's not as big as it was."  She completed the last course of Keflex for presumed skin infection to her port/chest area.  She is also concerned today  that she has a similar area to her (R) forearm and she doesn't know why she keeps getting these "spots" on her skin.   She also recently complained of (R) hip/pelvis pain at her last visit and this was evaluated with an X-ray, which was negative.   Review of Systems  Constitutional: Negative.  Negative for chills and fever.  HENT: Negative.   Eyes: Negative.   Respiratory: Negative.   Cardiovascular: Negative.   Gastrointestinal: Negative.   Genitourinary: Negative.   Musculoskeletal: Joint pain: right hip/pelvis pain with weight bearing.  Skin:       -Right axilla bullae at surgical site -Right forearm wound in antecubital area; "this has been here awhile too, but now it's open."    Neurological: Negative.   Endo/Heme/Allergies: Negative.   Psychiatric/Behavioral: Negative.     Past Medical History:  Diagnosis Date  . Breast cancer ([place])   . Breast cancer of upper-outer quadrant of right female breast ([place]) 06/11/2015   Triple negative, 2 cm   . Diabetes mellitus ([place])    Type 1 over 15 yrs  . Environmental allergies   . GERD (gastroesophageal reflux disease)   . Iron deficiency anemia due to chronic blood loss 06/12/2015  . 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) ([place])  Past Surgical History:  Procedure Laterality Date  . CESAREAN SECTION    . COLONOSCOPY N/A 10/09/2013   Procedure: COLONOSCOPY;  Surgeon: Rogene Houston, MD;  [facility];  Service: Endoscopy;  Laterality: N/A;  100  . DILATION AND CURETTAGE OF UTERUS     x 2 for miscarriages  . ESOPHAGOGASTRODUODENOSCOPY    . FLEXIBLE SIGMOIDOSCOPY    . FLEXIBLE SIGMOIDOSCOPY  07/27/2011   Procedure: FLEXIBLE SIGMOIDOSCOPY;  Surgeon: Rogene Houston, MD;  Location: [facility];  Service: Endoscopy;  Laterality: N/A;  100    Family History  Problem Relation Age of Onset  . Colon cancer Cousin     Social History   Social History  . Marital status: Single    Spouse name: N/A  . Number of  children: N/A  . Years of education: N/A   Social History Main Topics  . Smoking status: Never Smoker  . Smokeless tobacco: Never Used  . Alcohol use No  . Drug use: No  . Sexual activity: Not on file   Other Topics Concern  . Not on file   Social History Narrative  . No narrative on file     PHYSICAL EXAMINATION  ECOG PERFORMANCE STATUS: 0 - Asymptomatic    GENERAL: Alert, no distress, well nourished, well developed, comfortable, cooperative; seen in infusion; unaccompanied SKIN: skin color & texture normal. -Improved (L) chest wall skin infection at port incision. -(R) axilla with intact bulla about 1.5 x 0.5 cm; clear-fluid filled bulla and mild skin thickness surrounding lesion; no erythema or streaking.  -(R) forearm at antecubital fold; scant drainage; appears to be healing.  HEAD: Normocephalic. EYES: Conjunctivae clear without exudate. Scleare anicteric.  EARS: External ears normal NEURO: Alert & oriented x 3 with fluent speech, no focal motor/sensory deficits, gait normal   LABORATORY DATA: CBC    Component Value Date/Time   WBC 7.8 08/13/2015 0930   RBC 3.79 (L) 08/13/2015 0930   HGB 10.4 (L) 08/13/2015 0930   HCT 32.0 (L) 08/13/2015 0930   PLT 201 08/13/2015 0930   MCV 84.4 08/13/2015 0930   MCH 27.4 08/13/2015 0930   MCHC 32.5 08/13/2015 0930   RDW 17.1 (H) 08/13/2015 0930   LYMPHSABS 1.2 08/13/2015 0930   MONOABS 1.1 (H) 08/13/2015 0930   EOSABS 0.0 08/13/2015 0930   BASOSABS 0.0 08/13/2015 0930      Chemistry      Component Value Date/Time   NA 138 08/13/2015 0930   K 3.6 08/13/2015 0930   CL 105 08/13/2015 0930   CO2 28 08/13/2015 0930   BUN 6 08/13/2015 0930   CREATININE 0.47 08/13/2015 0930      Component Value Date/Time   CALCIUM 8.6 (L) 08/13/2015 0930   ALKPHOS 86 08/13/2015 0930   AST 21 08/13/2015 0930   ALT 17 08/13/2015 0930   BILITOT 0.2 (L) 08/13/2015 0930      *Labs reviewed and are adequate for chemotherapy today.    PENDING LABS: None.   RADIOGRAPHIC STUDIES: Dg Hip Unilat With Pelvis 2-3 Views Right  Result Date: 07/23/2015 CLINICAL DATA:  Pain for approximately 1 month. History of right-sided breast carcinoma. EXAM: DG HIP (WITH OR WITHOUT PELVIS) 2-3V RIGHT COMPARISON:  None. FINDINGS: Frontal pelvis as well as frontal and lateral right hip images were obtained. There is no demonstrable fracture or dislocation. The joint spaces appear normal. No erosive change. No blastic or lytic bone lesions are evident. There is an intrauterine device in  the mid-pelvis. IMPRESSION: Intrauterine device in mid pelvis. No demonstrable fracture or dislocation. No blastic or lytic lesions. Hip and sacroiliac joints appear symmetric bilaterally. Electronically Signed   By: Lowella Grip III M.D.   On: 07/23/2015 13:43     PATHOLOGY: None for this visit.     ASSESSMENT AND PLAN:  -Labs reviewed and adequate for treatment today.  Proceed with cycle #4 AC today.  -(R) axilla skin bulla: Keflex 500 mg po BID x 14 days to prevent infection during nadir with cycle #4 AC.  Non-stick gauze pads given to patient to keep area covered to further prevent debris/infection in wounds.     DISPO: -Return to clinic in 2 weeks for Cycle #1 of Taxol.    ORDERS PLACED FOR THIS ENCOUNTER: No orders of the defined types were placed in this encounter.   MEDICATIONS PRESCRIBED THIS ENCOUNTER: Meds ordered this encounter  Medications  . cephALEXin (KEFLEX) 500 MG capsule    Sig: Take 1 capsule (500 mg total) by mouth 2 (two) times daily.    Dispense:  14 capsule    Refill:  0    Order Specific Question:   Supervising Provider    Answer:   Hall Busing [id number]    THERAPY PLAN:  Adjuvant chemotherapy consisting of AC x 4 in a dose dense fashion followed by weekly Paclitaxel x 12.  She will then undergo XRT.  All questions were answered. The patient knows to call the clinic with any problems, questions or concerns. We  can certainly see the patient much sooner if necessary.    This note is electronically signed by:  Mike Craze, NP 08/13/2015 12:55 PM

## 2015-08-16 ENCOUNTER — Encounter (HOSPITAL_COMMUNITY): Payer: Self-pay | Admitting: Oncology

## 2015-08-20 ENCOUNTER — Telehealth (HOSPITAL_COMMUNITY): Payer: Self-pay | Admitting: *Deleted

## 2015-08-20 ENCOUNTER — Encounter (HOSPITAL_COMMUNITY): Payer: Self-pay

## 2015-08-27 ENCOUNTER — Encounter (HOSPITAL_BASED_OUTPATIENT_CLINIC_OR_DEPARTMENT_OTHER): Payer: Medicaid Other | Admitting: Oncology

## 2015-08-27 ENCOUNTER — Encounter (HOSPITAL_BASED_OUTPATIENT_CLINIC_OR_DEPARTMENT_OTHER): Payer: Medicaid Other

## 2015-08-27 ENCOUNTER — Other Ambulatory Visit (HOSPITAL_COMMUNITY): Payer: Self-pay | Admitting: Oncology

## 2015-08-27 ENCOUNTER — Encounter (HOSPITAL_COMMUNITY): Payer: Self-pay | Admitting: Oncology

## 2015-08-27 VITALS — BP 145/90 | HR 81 | Temp 98.3°F | Resp 16

## 2015-08-27 VITALS — BP 129/82 | HR 81 | Temp 98.3°F | Resp 18 | Ht 61.0 in | Wt 176.8 lb

## 2015-08-27 DIAGNOSIS — C50411 Malignant neoplasm of upper-outer quadrant of right female breast: Secondary | ICD-10-CM

## 2015-08-27 DIAGNOSIS — R110 Nausea: Secondary | ICD-10-CM | POA: Diagnosis not present

## 2015-08-27 DIAGNOSIS — B3789 Other sites of candidiasis: Secondary | ICD-10-CM | POA: Diagnosis not present

## 2015-08-27 DIAGNOSIS — Z5111 Encounter for antineoplastic chemotherapy: Secondary | ICD-10-CM | POA: Diagnosis not present

## 2015-08-27 LAB — COMPREHENSIVE METABOLIC PANEL
ALBUMIN: 3.7 g/dL (ref 3.5–5.0)
ALK PHOS: 85 U/L (ref 38–126)
ALT: 15 U/L (ref 14–54)
ANION GAP: 4 — AB (ref 5–15)
AST: 21 U/L (ref 15–41)
BILIRUBIN TOTAL: 0.3 mg/dL (ref 0.3–1.2)
BUN: 5 mg/dL — AB (ref 6–20)
CALCIUM: 8.6 mg/dL — AB (ref 8.9–10.3)
CO2: 28 mmol/L (ref 22–32)
CREATININE: 0.48 mg/dL (ref 0.44–1.00)
Chloride: 104 mmol/L (ref 101–111)
GFR calc Af Amer: >60 mL/min (ref >60)
GFR calc non Af Amer: >60 mL/min (ref >60)
GLUCOSE: 293 mg/dL — AB (ref 65–99)
Potassium: 3.8 mmol/L (ref 3.5–5.1)
Sodium: 136 mmol/L (ref 135–145)
TOTAL PROTEIN: 6.4 g/dL — AB (ref 6.5–8.1)

## 2015-08-27 LAB — CBC WITH DIFFERENTIAL/PLATELET
Basophils Absolute: 0 10*3/uL (ref 0.0–0.1)
Basophils Relative: 0 %
EOS PCT: 0 %
Eosinophils Absolute: 0 10*3/uL (ref 0.0–0.7)
HEMATOCRIT: 31.9 % — AB (ref 36.0–46.0)
HEMOGLOBIN: 10.2 g/dL — AB (ref 12.0–15.0)
LYMPHS ABS: 1 10*3/uL (ref 0.7–4.0)
Lymphocytes Relative: 11 %
MCH: 27.1 pg (ref 26.0–34.0)
MCHC: 32 g/dL (ref 30.0–36.0)
MCV: 84.6 fL (ref 78.0–100.0)
MONOS PCT: 17 %
Monocytes Absolute: 1.5 10*3/uL — ABNORMAL HIGH (ref 0.1–1.0)
NEUTROS ABS: 6.3 10*3/uL (ref 1.7–7.7)
Neutrophils Relative %: 72 %
Platelets: 222 10*3/uL (ref 150–400)
RBC: 3.77 MIL/uL — AB (ref 3.87–5.11)
RDW: 17.2 % — ABNORMAL HIGH (ref 11.5–15.5)
WBC: 8.8 10*3/uL (ref 4.0–10.5)

## 2015-08-27 MED ORDER — HEPARIN SOD (PORK) LOCK FLUSH 100 UNIT/ML IV SOLN
INTRAVENOUS | Status: AC
  Filled 2015-08-27: qty 5

## 2015-08-27 MED ORDER — SODIUM CHLORIDE 0.9 % IV SOLN
Freq: Once | INTRAVENOUS | Status: AC
  Administered 2015-08-27: 13:00:00 via INTRAVENOUS

## 2015-08-27 MED ORDER — SODIUM CHLORIDE 0.9 % IV SOLN
20.0000 mg | Freq: Once | INTRAVENOUS | Status: AC
  Administered 2015-08-27: 20 mg via INTRAVENOUS
  Filled 2015-08-27: qty 2

## 2015-08-27 MED ORDER — PACLITAXEL CHEMO INJECTION 300 MG/50ML
80.0000 mg/m2 | Freq: Once | INTRAVENOUS | Status: AC
  Administered 2015-08-27: 150 mg via INTRAVENOUS
  Filled 2015-08-27: qty 25

## 2015-08-27 MED ORDER — DIPHENHYDRAMINE HCL 50 MG/ML IJ SOLN
INTRAMUSCULAR | Status: AC
  Filled 2015-08-27: qty 1

## 2015-08-27 MED ORDER — DIPHENHYDRAMINE HCL 50 MG/ML IJ SOLN
50.0000 mg | Freq: Once | INTRAMUSCULAR | Status: AC
  Administered 2015-08-27: 50 mg via INTRAVENOUS

## 2015-08-27 MED ORDER — HEPARIN SOD (PORK) LOCK FLUSH 100 UNIT/ML IV SOLN
500.0000 [IU] | Freq: Once | INTRAVENOUS | Status: AC | PRN
  Administered 2015-08-27: 500 [IU]

## 2015-08-27 MED ORDER — CLOTRIMAZOLE-BETAMETHASONE 1-0.05 % EX CREA: 1.0000 "application " | TOPICAL_CREAM | Freq: Two times a day (BID) | CUTANEOUS | 0 refills | Status: DC

## 2015-08-27 MED ORDER — SODIUM CHLORIDE 0.9% FLUSH
10.0000 mL | INTRAVENOUS | Status: DC | PRN
Start: 2015-08-27 — End: 2015-08-27
  Administered 2015-08-27: 10 mL
  Filled 2015-08-27: qty 10

## 2015-08-27 MED ORDER — FAMOTIDINE IN NACL 20-0.9 MG/50ML-% IV SOLN
INTRAVENOUS | Status: AC
Start: 2015-08-27 — End: 2015-08-27
  Filled 2015-08-27: qty 50

## 2015-08-27 MED ORDER — FAMOTIDINE IN NACL 20-0.9 MG/50ML-% IV SOLN
20.0000 mg | Freq: Once | INTRAVENOUS | Status: AC
  Administered 2015-08-27: 20 mg via INTRAVENOUS

## 2015-08-27 NOTE — Progress Notes (Signed)
Tolerated taxol infusion well. Ambulatory on discharge home to self.

## 2015-08-27 NOTE — Progress Notes (Signed)
[name], MD [place] [address]  Breast cancer of upper-outer quadrant of right female breast ([place])  Candida rash of groin - Plan: clotrimazole-betamethasone (LOTRISONE) cream  CURRENT THERAPY: Beginning AC followed by T systemic chemotherapy today, 06/25/2015.   INTERVAL HISTORY: Brittney [name] 45 y.o. 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 Anthony Sar on 05/13/2015. AND Iron deficiency anemia, poor tolerance to PO supplementation.    Breast cancer of upper-outer quadrant of right female breast ([place])   04/29/2015 Initial Biopsy    upper outer quadrant mass R breast 2.9 cm by ultrasound, high grade invasive ductal carcinoma ER< 1%, PR 1%, HER -2 not amplified, Ki-67 66%      05/13/2015 Cancer Staging    pT1cN0      05/13/2015 Surgery    lumpectomy and sentinel node biopsies X 3, 2 cm tumor, 3 negative nodes       05/21/2015 Imaging    CT C/A[facility]P [facility] with postsurgical changes related to R breast lympectomy and R axillary LN dissection, no findings suspicious for metastatic disease      06/12/2015 Procedure    Port placement by Dr. Anthony Sar      06/17/2015 Imaging    MUGA- Normal left ventricular ejection fraction equal to 62.9%.      06/25/2015 -  Chemotherapy    AC x 4       She is tolerating treatment reasonably well.  She notes nausea without vomiting lasting ~ 5 days.  She has no documented weight loss that is significant.  She notes an right labial outbreak of HSV since being seen last.  She has been seen by her primary care provider who increased her acyclovir dose.  She notes that it has improved and nearly resolved.  She notes a rash with tenderness in her groin area bilaterally.  She notes that it has been an issue since her pubis alopecia secondary to chemotherapy.  She reports issues with feelings  of helplessness and emotional issues that have been compounded by the above issues and her minor complications through therapy.  Review of Systems  Constitutional: Negative.  Negative for chills, fever and weight loss.  HENT: Negative.   Eyes: Negative.   Respiratory: Negative.  Negative for cough.   Cardiovascular: Negative for chest pain.  Gastrointestinal: Positive for nausea. Negative for vomiting.  Genitourinary: Negative.   Musculoskeletal: Negative.   Skin: Positive for itching and rash.  Neurological: Negative.  Negative for weakness.  Endo/Heme/Allergies: Negative.   Psychiatric/Behavioral: Positive for depression.    Past Medical History:  Diagnosis Date  . Breast cancer ([place])   . Breast cancer of upper-outer quadrant of right female breast ([place]) 06/11/2015   Triple negative, 2 cm   . Diabetes mellitus ([place])    Type 1 over 15 yrs  . Environmental allergies   . GERD (gastroesophageal reflux disease)   . Iron deficiency anemia due to chronic blood loss 06/12/2015  . 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 CESAREAN SECTION    . COLONOSCOPY N/A 10/09/2013   Procedure: COLONOSCOPY;  Surgeon: Rogene Houston, MD;  Location[facility];  Service: Endoscopy;  Laterality: N/A;  100  . DILATION AND CURETTAGE OF UTERUS     x 2 for  miscarriages  . ESOPHAGOGASTRODUODENOSCOPY    . FLEXIBLE SIGMOIDOSCOPY    . FLEXIBLE SIGMOIDOSCOPY  07/27/2011   Procedure: FLEXIBLE SIGMOIDOSCOPY;  Surgeon: Rogene Houston, MD;  Location[facility];  Service: Endoscopy;  Laterality: N/A;  100    Family History  Problem Relation Age of Onset  . Colon cancer Cousin     Social History   Social History  . Marital status: Single    Spouse name: N/A  . Number of children: N/A  . Years of education: N/A   Social History Main Topics  . Smoking status: Never Smoker  . Smokeless tobacco: Never Used  . Alcohol use No  . Drug  use: No  . Sexual activity: Not Asked   Other Topics Concern  . None   Social History Narrative  . None     PHYSICAL EXAMINATION  ECOG PERFORMANCE STATUS: 1 - Symptomatic but completely ambulatory  Vitals:   08/27/15 0946  BP: 129/82  Pulse: 81  Resp: 18  Temp: 98.3 F (36.8 C)    GENERAL:alert, no distress, well nourished, well developed, comfortable, cooperative, obese, smiling and tearful at time and unaccompanied SKIN: skin color, texture, turgor are normal, no rashes or significant lesions HEAD: Normocephalic, No masses, lesions, tenderness or abnormalities, alopecia EYES: normal, Conjunctiva are pink and non-injected EARS: External ears normal OROPHARYNX:lips, buccal mucosa, and tongue normal and mucous membranes are moist  NECK: supple, trachea midline LYMPH:  not examined BREAST:right lumpectomy site is nearly completely healed and closed, with tenderness to palpation superiorly. LUNGS: clear to auscultation and percussion HEART: regular rate & rhythm, no murmurs, no gallops, S1 normal and S2 normal ABDOMEN:abdomen soft, obese and normal bowel sounds BACK: Back symmetric, no curvature. EXTREMITIES:less then 2 second capillary refill, no joint deformities, effusion, or inflammation, no skin discoloration, no cyanosis, positive findings:  edema trace bilateral pitting ankle edema  NEURO: alert & oriented x 3 with fluent speech, no focal motor/sensory deficits, gait normal   LABORATORY DATA: CBC    Component Value Date/Time   WBC 8.8 08/27/2015 1111   RBC 3.77 (L) 08/27/2015 1111   HGB 10.2 (L) 08/27/2015 1111   HCT 31.9 (L) 08/27/2015 1111   PLT 222 08/27/2015 1111   MCV 84.6 08/27/2015 1111   MCH 27.1 08/27/2015 1111   MCHC 32.0 08/27/2015 1111   RDW 17.2 (H) 08/27/2015 1111   LYMPHSABS 1.0 08/27/2015 1111   MONOABS 1.5 (H) 08/27/2015 1111   EOSABS 0.0 08/27/2015 1111   BASOSABS 0.0 08/27/2015 1111      Chemistry      Component Value Date/Time   NA  136 08/27/2015 1111   K 3.8 08/27/2015 1111   CL 104 08/27/2015 1111   CO2 28 08/27/2015 1111   BUN 5 (L) 08/27/2015 1111   CREATININE 0.48 08/27/2015 1111      Component Value Date/Time   CALCIUM 8.6 (L) 08/27/2015 1111   ALKPHOS 85 08/27/2015 1111   AST 21 08/27/2015 1111   ALT 15 08/27/2015 1111   BILITOT 0.3 08/27/2015 1111        PENDING LABS:   RADIOGRAPHIC STUDIES:  No results found.   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 Anthony Sar on 05/13/2015.  Beginning [facility] chemotherapy x 4 on 06/25/2015 and now on T.  Oncology  history is updated.  Pre-treatment labs today: CBC diff, CMET.  I personally reviewed and went over laboratory results with the patient.  The results are noted within this dictation.  Today, she starts Paclitaxel therapy.  We reviewed the risks, benefits, alternatives, and side effects of this therapy.  Brittney [name] is having a difficult time with treatment from an emotional standpoint.  We reviewed some of the issues she is having with treatment:  1. Nausea x 5 days post-treatment.  We reviewed her antiemetic regimen.  She notes that Ativan seems to be most effective.  She will start a regimen consisting of Ativan/Zofran in the AM and then Zofran every 8 hours and Ativan every 4 hours PRN.  She is advised to judge her nausea on a daily basis as it may not be an issue with Paclitaxel single-agent therapy which she starts today.  2. Intertrigo candida.  She notes that this has been more of an issue since she has experienced pubic alopecia with chemotherapy. Rx is prescribed for Lotrisone cream.  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  Recent increase in acyclovir dose.  Improving with increased anti-viral therapy.  Focus on right labial folds.   Improved.  One lesion remains.  4. Emotional lability.  We reviewed her treatment plan and future treatment moving forward.  We discussed her feelings of "uselessness."  She is on Prozac.  We are hopeful that with antiemetic regimen and transition to Paclitaxel therapy her overall wellbeing will improve and therefore so too will her emotional health.   Return in 1 week for next treatment.  I will find a spot for her to be seen that day too.  Return in 2 weeks for follow-up as scheduled with chemotherapy.   ORDERS PLACED FOR THIS ENCOUNTER: No orders of the defined types were placed in this encounter.   MEDICATIONS PRESCRIBED THIS ENCOUNTER: Meds ordered this encounter  Medications  . clotrimazole-betamethasone (LOTRISONE) cream    Sig: Apply 1 application topically 2 (two) times daily. Apply to groin    Dispense:  30 g    Refill:  0    Order Specific Question:   Supervising Provider    Answer:   Patrici Ranks [id number]    THERAPY PLAN:  Adjuvant chemotherapy consisting of AC x 4 in a dose dense fashion followed by weekly Paclitaxel x 12.  She will then undergo XRT.  All questions were answered. The patient knows to call the clinic with any problems, questions or concerns. We can certainly see the patient much sooner if necessary.  Patient and plan discussed with Dr. Ancil Linsey and she is in agreement with the aforementioned.   This note is electronically signed by: Doy Mince 08/27/2015 4:59 PM

## 2015-08-27 NOTE — Patient Instructions (Signed)
[facility] Discharge Instructions for Patients Receiving Chemotherapy   Beginning January 23rd 2017 lab work for the [facility] will be done in the  Main lab at [facility] on 1st floor. If you have a lab appointment with the [facility] please come in thru the  Main Entrance and check in at the main information desk   Today you received the following chemotherapy agents Taxol.  To help prevent nausea and vomiting after your treatment, we encourage you to take your nausea medication as instructed. If you develop nausea and vomiting, or diarrhea that is not controlled by your medication, call the clinic.  The clinic phone number is [phone]. Office hours are Monday-Friday 8:30am-5:00pm.  BELOW ARE SYMPTOMS THAT SHOULD BE REPORTED IMMEDIATELY:  *FEVER GREATER THAN 101.0 F  *CHILLS WITH OR WITHOUT FEVER  NAUSEA AND VOMITING THAT IS NOT CONTROLLED WITH YOUR NAUSEA MEDICATION  *UNUSUAL SHORTNESS OF BREATH  *UNUSUAL BRUISING OR BLEEDING  TENDERNESS IN MOUTH AND THROAT WITH OR WITHOUT PRESENCE OF ULCERS  *URINARY PROBLEMS  *BOWEL PROBLEMS  UNUSUAL RASH Items with * indicate a potential emergency and should be followed up as soon as possible. If you have an emergency after office hours please contact your primary care physician or go to the nearest emergency department.  Please call the clinic during office hours if you have any questions or concerns.   You may also contact the Patient Navigator at [phone] should you have any questions or need assistance in obtaining follow up care.      Resources For Cancer Patients and their Caregivers ? American Cancer Society: Can assist with transportation, wigs, general needs, runs Look Good Feel Better.        678 405 2643 ? Cancer Care: Provides financial assistance, online support groups, medication/co-pay assistance.  1-800-813-HOPE 617-690-9962) ? [facility] Assists [place] cancer patients and their families through emotional , educational and financial support.  781-552-9798 ? [place] DSS Where to apply for food stamps, Medicaid and utility assistance. 8703739251 ? RCATS: Transportation to medical appointments. 224-014-7254 ? Social Security Administration: May apply for disability if have a Stage IV cancer. 650-559-2154 (717)140-5133 ? [employer], Disability and Transit Services: Assists with nutrition, care and transit needs. [phone]

## 2015-08-27 NOTE — Patient Instructions (Signed)
[place] at [facility] Discharge Instructions  RECOMMENDATIONS MADE BY THE CONSULTANT AND ANY TEST RESULTS WILL BE SENT TO YOUR REFERRING PHYSICIAN.  Exam done today by Kirby Crigler PA-C, will give you prescription for Lotrisone. Take Ativan and Zofran in the morning together then zofran every 8 hrs until nausea improves. You may take Ativan every 4 hours for the nausea.  Return in 1 week to see Gershon Mussel and follow up in 2 weeks for treatment.   Thank you for choosing [place] at [facility] to provide your oncology and hematology care.  To afford each patient quality time with our provider, please arrive at least 15 minutes before your scheduled appointment time.   Beginning January 23rd 2017 lab work for the [facility] will be done in the  Main lab at [facility] on 1st floor. If you have a lab appointment with the [facility] please come in thru the  Main Entrance and check in at the main information desk  You need to re-schedule your appointment should you arrive 10 or more minutes late.  We strive to give you quality time with our providers, and arriving late affects you and other patients whose appointments are after yours.  Also, if you no show three or more times for appointments you may be dismissed from the clinic at the providers discretion.     Again, thank you for choosing [facility].  Our hope is that these requests will decrease the amount of time that you wait before being seen by our physicians.       _____________________________________________________________  Should you have questions after your visit to [facility], please contact our office at [phone] between the hours of 8:30 a.m. and 4:30 p.m.  Voicemails left after 4:30 p.m. will not be returned until the following business day.  For prescription refill requests, have your pharmacy contact our office.         Resources For Cancer  Patients and their Caregivers ? American Cancer Society: Can assist with transportation, wigs, general needs, runs Look Good Feel Better.        905-677-5124 ? Cancer Care: Provides financial assistance, online support groups, medication/co-pay assistance.  1-800-813-HOPE 954-760-1504) ? [facility] Assists [place] cancer patients and their families through emotional , educational and financial support.  (618)704-9501 ? [place] DSS Where to apply for food stamps, Medicaid and utility assistance. (213)829-1242 ? RCATS: Transportation to medical appointments. (215)706-1074 ? Social Security Administration: May apply for disability if have a Stage IV cancer. (918) 672-5428 (360)529-6424 ? [employer], Disability and Transit Services: Assists with nutrition, care and transit needs. [facility] Support Programs: @10RELATIVEDAYS @ > Cancer Support Group  2nd Tuesday of the month 1pm-2pm, Journey Room  > Creative Journey  3rd Tuesday of the month 1130am-1pm, Journey Room  > Look Good Feel Better  1st Wednesday of the month 10am-12 noon, Journey Room (Call [place] to register [phone])

## 2015-08-27 NOTE — Assessment & Plan Note (Addendum)
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 Anthony Sar on 05/13/2015.  Beginning [facility] chemotherapy x 4 on 06/25/2015 and now on T.  Oncology history is updated.  Pre-treatment labs today: CBC diff, CMET.  I personally reviewed and went over laboratory results with the patient.  The results are noted within this dictation.  Today, she starts Paclitaxel therapy.  We reviewed the risks, benefits, alternatives, and side effects of this therapy.  Brittney [name] is having a difficult time with treatment from an emotional standpoint.  We reviewed some of the issues she is having with treatment:  1. Nausea x 5 days post-treatment.  We reviewed her antiemetic regimen.  She notes that Ativan seems to be most effective.  She will start a regimen consisting of Ativan/Zofran in the AM and then Zofran every 8 hours and Ativan every 4 hours PRN.  She is advised to judge her nausea on a daily basis as it may not be an issue with Paclitaxel single-agent therapy which she starts today.  2. Intertrigo candida.  She notes that this has been more of an issue since she has experienced pubic alopecia with chemotherapy. Rx is prescribed for Lotrisone cream.  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  Recent increase in acyclovir dose.  Improving with increased anti-viral therapy.  Focus on right labial folds.  Improved.  One lesion remains.  4. Emotional lability.  We reviewed her treatment plan and future treatment moving forward.  We discussed her feelings of "uselessness."  She is on Prozac.  We are hopeful that with antiemetic regimen and transition to Paclitaxel therapy her overall wellbeing will improve and therefore so too will her emotional health.   Return in 1 week for next treatment.  I will find a spot for her to be seen that day too.  Return in 2 weeks for follow-up as scheduled with chemotherapy.

## 2015-08-28 ENCOUNTER — Telehealth (HOSPITAL_COMMUNITY): Payer: Self-pay | Admitting: *Deleted

## 2015-08-28 NOTE — Telephone Encounter (Signed)
Spoke with patient to follow up after 1st Taxol. Patient reports she was only slightly nauseated and it was not as bad as with the [facility]. No other complaints voiced.

## 2015-08-31 ENCOUNTER — Encounter (INDEPENDENT_AMBULATORY_CARE_PROVIDER_SITE_OTHER): Payer: Self-pay | Admitting: Internal Medicine

## 2015-09-03 ENCOUNTER — Other Ambulatory Visit (HOSPITAL_COMMUNITY): Payer: Self-pay | Admitting: Oncology

## 2015-09-03 ENCOUNTER — Encounter (HOSPITAL_BASED_OUTPATIENT_CLINIC_OR_DEPARTMENT_OTHER): Payer: Medicaid Other | Admitting: Oncology

## 2015-09-03 ENCOUNTER — Encounter (HOSPITAL_COMMUNITY): Payer: Self-pay | Admitting: Oncology

## 2015-09-03 ENCOUNTER — Encounter (HOSPITAL_BASED_OUTPATIENT_CLINIC_OR_DEPARTMENT_OTHER): Payer: Medicaid Other

## 2015-09-03 VITALS — BP 138/79 | HR 85 | Temp 99.4°F | Resp 16 | Wt 176.2 lb

## 2015-09-03 DIAGNOSIS — Z5111 Encounter for antineoplastic chemotherapy: Secondary | ICD-10-CM

## 2015-09-03 DIAGNOSIS — E109 Type 1 diabetes mellitus without complications: Secondary | ICD-10-CM | POA: Diagnosis not present

## 2015-09-03 DIAGNOSIS — E108 Type 1 diabetes mellitus with unspecified complications: Secondary | ICD-10-CM

## 2015-09-03 DIAGNOSIS — C50411 Malignant neoplasm of upper-outer quadrant of right female breast: Secondary | ICD-10-CM

## 2015-09-03 LAB — CBC WITH DIFFERENTIAL/PLATELET
BASOS PCT: 0 %
Basophils Absolute: 0 10*3/uL (ref 0.0–0.1)
EOS ABS: 0 10*3/uL (ref 0.0–0.7)
Eosinophils Relative: 0 %
HCT: 29.7 % — ABNORMAL LOW (ref 36.0–46.0)
HEMOGLOBIN: 9.8 g/dL — AB (ref 12.0–15.0)
LYMPHS ABS: 0.6 10*3/uL — AB (ref 0.7–4.0)
Lymphocytes Relative: 23 %
MCH: 27.3 pg (ref 26.0–34.0)
MCHC: 33 g/dL (ref 30.0–36.0)
MCV: 82.7 fL (ref 78.0–100.0)
Monocytes Absolute: 0.5 10*3/uL (ref 0.1–1.0)
Monocytes Relative: 20 %
NEUTROS PCT: 57 %
Neutro Abs: 1.4 10*3/uL — ABNORMAL LOW (ref 1.7–7.7)
Platelets: 304 10*3/uL (ref 150–400)
RBC: 3.59 MIL/uL — AB (ref 3.87–5.11)
RDW: 16.6 % — ABNORMAL HIGH (ref 11.5–15.5)
WBC: 2.4 10*3/uL — AB (ref 4.0–10.5)

## 2015-09-03 LAB — COMPREHENSIVE METABOLIC PANEL
ALBUMIN: 3.7 g/dL (ref 3.5–5.0)
ALK PHOS: 69 U/L (ref 38–126)
ALT: 16 U/L (ref 14–54)
AST: 22 U/L (ref 15–41)
Anion gap: 7 (ref 5–15)
BUN: 11 mg/dL (ref 6–20)
CALCIUM: 8.8 mg/dL — AB (ref 8.9–10.3)
CO2: 26 mmol/L (ref 22–32)
CREATININE: 0.49 mg/dL (ref 0.44–1.00)
Chloride: 101 mmol/L (ref 101–111)
GFR calc Af Amer: >60 mL/min (ref >60)
GFR calc non Af Amer: >60 mL/min (ref >60)
GLUCOSE: 352 mg/dL — AB (ref 65–99)
Potassium: 3.9 mmol/L (ref 3.5–5.1)
SODIUM: 134 mmol/L — AB (ref 135–145)
Total Bilirubin: 0.7 mg/dL (ref 0.3–1.2)
Total Protein: 6.3 g/dL — ABNORMAL LOW (ref 6.5–8.1)

## 2015-09-03 MED ORDER — FAMOTIDINE IN NACL 20-0.9 MG/50ML-% IV SOLN
INTRAVENOUS | Status: AC
  Filled 2015-09-03: qty 50

## 2015-09-03 MED ORDER — HEPARIN SOD (PORK) LOCK FLUSH 100 UNIT/ML IV SOLN
500.0000 [IU] | Freq: Once | INTRAVENOUS | Status: AC | PRN
  Administered 2015-09-03: 500 [IU]

## 2015-09-03 MED ORDER — SODIUM CHLORIDE 0.9 % IV SOLN
20.0000 mg | Freq: Once | INTRAVENOUS | Status: AC
  Administered 2015-09-03: 20 mg via INTRAVENOUS
  Filled 2015-09-03: qty 2

## 2015-09-03 MED ORDER — INSULIN ASPART 100 UNIT/ML ~~LOC~~ SOLN
8.0000 [IU] | Freq: Once | SUBCUTANEOUS | Status: AC
  Administered 2015-09-03: 8 [IU] via SUBCUTANEOUS
  Filled 2015-09-03: qty 0.08

## 2015-09-03 MED ORDER — DIPHENHYDRAMINE HCL 50 MG/ML IJ SOLN
INTRAMUSCULAR | Status: AC
  Filled 2015-09-03: qty 1

## 2015-09-03 MED ORDER — FAMOTIDINE IN NACL 20-0.9 MG/50ML-% IV SOLN
20.0000 mg | Freq: Once | INTRAVENOUS | Status: AC
  Administered 2015-09-03: 20 mg via INTRAVENOUS

## 2015-09-03 MED ORDER — SODIUM CHLORIDE 0.9 % IV SOLN
Freq: Once | INTRAVENOUS | Status: AC
  Administered 2015-09-03: 11:00:00 via INTRAVENOUS

## 2015-09-03 MED ORDER — HEPARIN SOD (PORK) LOCK FLUSH 100 UNIT/ML IV SOLN
INTRAVENOUS | Status: AC
  Filled 2015-09-03: qty 5

## 2015-09-03 MED ORDER — DIPHENHYDRAMINE HCL 50 MG/ML IJ SOLN
50.0000 mg | Freq: Once | INTRAMUSCULAR | Status: AC
  Administered 2015-09-03: 50 mg via INTRAVENOUS

## 2015-09-03 MED ORDER — SODIUM CHLORIDE 0.9% FLUSH
10.0000 mL | INTRAVENOUS | Status: DC | PRN
  Administered 2015-09-03: 10 mL
  Filled 2015-09-03: qty 10

## 2015-09-03 MED ORDER — PACLITAXEL CHEMO INJECTION 300 MG/50ML
80.0000 mg/m2 | Freq: Once | INTRAVENOUS | Status: AC
  Administered 2015-09-03: 150 mg via INTRAVENOUS
  Filled 2015-09-03: qty 25

## 2015-09-03 NOTE — Patient Instructions (Signed)
[facility] Discharge Instructions for Patients Receiving Chemotherapy   Beginning January 23rd 2017 lab work for the [facility] will be done in the  Main lab at [facility] on 1st floor. If you have a lab appointment with the [facility] please come in thru the  Main Entrance and check in at the main information desk   Today you received the following chemotherapy agents Taxol  To help prevent nausea and vomiting after your treatment, we encourage you to take your nausea medication      If you develop nausea and vomiting, or diarrhea that is not controlled by your medication, call the clinic.  The clinic phone number is [phone]. Office hours are Monday-Friday 8:30am-5:00pm.  BELOW ARE SYMPTOMS THAT SHOULD BE REPORTED IMMEDIATELY:  *FEVER GREATER THAN 101.0 F  *CHILLS WITH OR WITHOUT FEVER  NAUSEA AND VOMITING THAT IS NOT CONTROLLED WITH YOUR NAUSEA MEDICATION  *UNUSUAL SHORTNESS OF BREATH  *UNUSUAL BRUISING OR BLEEDING  TENDERNESS IN MOUTH AND THROAT WITH OR WITHOUT PRESENCE OF ULCERS  *URINARY PROBLEMS  *BOWEL PROBLEMS  UNUSUAL RASH Items with * indicate a potential emergency and should be followed up as soon as possible. If you have an emergency after office hours please contact your primary care physician or go to the nearest emergency department.  Please call the clinic during office hours if you have any questions or concerns.   You may also contact the Patient Navigator at [phone] should you have any questions or need assistance in obtaining follow up care.      Resources For Cancer Patients and their Caregivers ? American Cancer Society: Can assist with transportation, wigs, general needs, runs Look Good Feel Better.        ([phone] ? Cancer Care: Provides financial assistance, online support groups, medication/co-pay assistance.  1-800-813-HOPE (838)859-0513) ? [place] Assists [place]  cancer patients and their families through emotional , educational and financial support.  438-815-3661 ? [place] DSS Where to apply for food stamps, Medicaid and utility assistance. 7078507294 ? RCATS: Transportation to medical appointments. 301-059-8606 ? Social Security Administration: May apply for disability if have a Stage IV cancer. [phone] 5877429027 ? [employer], Disability and Transit Services: Assists with nutrition, care and transit needs. [phone]

## 2015-09-03 NOTE — Treatment Plan (Cosign Needed)
anc 1.4 today. Pt had visit with provider and provider reviewed all labs

## 2015-09-03 NOTE — Progress Notes (Signed)
Patient received Chemotherapy today per orders. Patient tolerated it well with no problems. Patient discharged ambulatory with mother at side. Vitals stable at discharge.

## 2015-09-03 NOTE — Assessment & Plan Note (Addendum)
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 Anthony Sar on 05/13/2015.  Beginning [facility] chemotherapy x 4 on 06/25/2015 and now on T.  Oncology history is updated.  Pre-treatment labs today: CBC diff, CMET.  I personally reviewed and went over laboratory results with the patient.  The results are noted within this dictation.  Labs satisfy treatment parameters.  Hyperglycemia is noted and therefore 8 units of insulin will be given in the clinic.  She notes that her nausea was much improved with transition to Paclitaxel therapy.  She had minimal nausea without any vomiting.  She did use her antiemetic treatment at home with success.  She notes some insomnia and reports that she has not used her Melatonin.  She is encouraged to restart her Melatonin.  Other options include benadryl, ZZZquil, tylenol PM.  I would avoid Rx strength medications at this time as she has early AM obligations with her daughter.  She notes that she is sleeping but sometimes she wakes in the early AM and cannot return back to sleep.  I will culture left axillary lesion that has surfaced an is open with white/yellow discharge.    Return in 1 week for follow-up as scheduled with chemotherapy.

## 2015-09-03 NOTE — Progress Notes (Signed)
Brittney Ponto, MD [place] [address]  Breast cancer of upper-outer quadrant of right female breast [facility]) - Plan: Aerobic Culture (superficial specimen), Aerobic Culture (superficial specimen)  Type 1 diabetes mellitus with complication (Brittney [name]) - Plan: insulin aspart (novoLOG) injection 8 Units  CURRENT THERAPY: S/P AC x 4 cycles and now on T chemotherapy.   INTERVAL HISTORY: Brittney [name] 45 y.o. 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 Anthony Sar on 05/13/2015. AND Iron deficiency anemia, poor tolerance to PO supplementation.    Breast cancer of upper-outer quadrant of right female breast ([place])   04/29/2015 Initial Biopsy    upper outer quadrant mass R breast 2.9 cm by ultrasound, high grade invasive ductal carcinoma ER< 1%, PR 1%, HER -2 not amplified, Ki-67 66%      05/13/2015 Cancer Staging    pT1cN0      05/13/2015 Surgery    lumpectomy and sentinel node biopsies X 3, 2 cm tumor, 3 negative nodes       05/21/2015 Imaging    CT [facility] with postsurgical changes related to R breast lympectomy and R axillary LN dissection, no findings suspicious for metastatic disease      06/12/2015 Procedure    Port placement by Dr. Anthony Sar      06/17/2015 Imaging    MUGA- Normal left ventricular ejection fraction equal to 62.9%.      06/25/2015 -  Chemotherapy    AC x 4       She notes that with the progression of therapy to paclitaxel, her previous cycle went very well.  She noted minimal nausea without any vomiting.  She notes minimal bone/muscle aches.  She denies any peripheral neuropathy.  She notes right axillary sore that has surfaced and opened.  She notes a relief in pain since it has broken the surface.  She notes minimal drainage of the sore at this time.  She also reports some issues with sleeping.  She wakes  in the early AM and cannot return to sleep.  As a result, she is more tired than usual today.  Review of Systems  Constitutional: Negative for chills, fever and weight loss.  HENT: Negative.   Eyes: Negative.   Respiratory: Negative.   Cardiovascular: Negative.   Gastrointestinal: Positive for nausea. Negative for constipation, diarrhea and vomiting.  Genitourinary: Negative.   Musculoskeletal: Negative.   Skin: Negative.   Neurological: Negative.  Negative for weakness.  Endo/Heme/Allergies: Negative.   Psychiatric/Behavioral: The patient has insomnia.     Past Medical History:  Diagnosis Date  . Breast cancer ([place])   . Breast cancer of upper-outer quadrant of right female breast ([place]) 06/11/2015   Triple negative, 2 cm   . Diabetes mellitus ([place])    Type 1 over 15 yrs  . Environmental allergies   . GERD (gastroesophageal reflux disease)   . Iron deficiency anemia due to chronic blood loss 06/12/2015  . 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 CESAREAN SECTION    . COLONOSCOPY N/A 10/09/2013   Procedure: COLONOSCOPY;  Surgeon: Rogene Houston, MD;  Location: [facility] ENDO SUITE;  Service: Endoscopy;  Laterality: N/A;  100  . DILATION AND CURETTAGE OF UTERUS     x 2 for miscarriages  . ESOPHAGOGASTRODUODENOSCOPY    .  FLEXIBLE SIGMOIDOSCOPY    . FLEXIBLE SIGMOIDOSCOPY  07/27/2011   Procedure: FLEXIBLE SIGMOIDOSCOPY;  Surgeon: Rogene Houston, MD;  Location: [facility] SUITE;  Service: Endoscopy;  Laterality: N/A;  100    Family History  Problem Relation Age of Onset  . Colon cancer Cousin     Social History   Social History  . Marital status: Single    Spouse name: N/A  . Number of children: N/A  . Years of education: N/A   Social History Main Topics  . Smoking status: Never Smoker  . Smokeless tobacco: Never Used  . Alcohol use No  . Drug use: No  . Sexual activity: Not Asked   Other Topics Concern  .  None   Social History Narrative  . None     PHYSICAL EXAMINATION  ECOG PERFORMANCE STATUS: 1 - Symptomatic but completely ambulatory  There were no vitals filed for this visit.  BP 123/69 P 88 R 16 T 98.6 O2 Sat 100% on RA.  GENERAL:alert, no distress, well nourished, well developed, comfortable, cooperative, obese, smiling. SKIN: skin color, texture, turgor are normal, no rashes or significant lesions HEAD: Normocephalic, No masses, lesions, tenderness or abnormalities, alopecia EYES: normal, Conjunctiva are pink and non-injected EARS: External ears normal OROPHARYNX:lips, buccal mucosa, and tongue normal and mucous membranes are moist  NECK: supple, trachea midline LYMPH:  not examined BREAST:not examined. LUNGS: clear to auscultation and percussion HEART: regular rate & rhythm, no murmurs, no gallops, S1 normal and S2 normal ABDOMEN:abdomen soft, obese and normal bowel sounds BACK: Back symmetric, no curvature. EXTREMITIES:less then 2 second capillary refill, no joint deformities, effusion, or inflammation, no skin discoloration, no cyanosis, Right axilla with open lesion draining white/yellow discharge without any erythema. NEURO: alert & oriented x 3 with fluent speech, no focal motor/sensory deficits, gait normal   LABORATORY DATA: CBC    Component Value Date/Time   WBC 2.4 (L) 09/03/2015 0938   RBC 3.59 (L) 09/03/2015 0938   HGB 9.8 (L) 09/03/2015 0938   HCT 29.7 (L) 09/03/2015 0938   PLT 304 09/03/2015 0938   MCV 82.7 09/03/2015 0938   MCH 27.3 09/03/2015 0938   MCHC 33.0 09/03/2015 0938   RDW 16.6 (H) 09/03/2015 0938   LYMPHSABS 0.6 (L) 09/03/2015 0938   MONOABS 0.5 09/03/2015 0938   EOSABS 0.0 09/03/2015 0938   BASOSABS 0.0 09/03/2015 0938      Chemistry      Component Value Date/Time   NA 134 (L) 09/03/2015 0938   K 3.9 09/03/2015 0938   CL 101 09/03/2015 0938   CO2 26 09/03/2015 0938   BUN 11 09/03/2015 0938   CREATININE 0.49 09/03/2015 0938       Component Value Date/Time   CALCIUM 8.8 (L) 09/03/2015 0938   ALKPHOS 69 09/03/2015 0938   AST 22 09/03/2015 0938   ALT 16 09/03/2015 0938   BILITOT 0.7 09/03/2015 0938        PENDING LABS:   RADIOGRAPHIC STUDIES:  No results found.   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 Anthony Sar on 05/13/2015.  Beginning [facility] chemotherapy x 4 on 06/25/2015 and now on T.  Oncology history is updated.  Pre-treatment labs today: CBC diff, CMET.  I personally reviewed and went over laboratory results with the patient.  The  results are noted within this dictation.  Labs satisfy treatment parameters.  Hyperglycemia is noted and therefore 8 units of insulin will be given in the clinic.  She notes that her nausea was much improved with transition to Paclitaxel therapy.  She had minimal nausea without any vomiting.  She did use her antiemetic treatment at home with success.  She notes some insomnia and reports that she has not used her Melatonin.  She is encouraged to restart her Melatonin.  Other options include benadryl, ZZZquil, tylenol PM.  I would avoid Rx strength medications at this time as she has early AM obligations with her daughter.  She notes that she is sleeping but sometimes she wakes in the early AM and cannot return back to sleep.  I will culture left axillary lesion that has surfaced an is open with white/yellow discharge.    Return in 1 week for follow-up as scheduled with chemotherapy.   ORDERS PLACED FOR THIS ENCOUNTER: Orders Placed This Encounter  Procedures  . Aerobic Culture (superficial specimen)    MEDICATIONS PRESCRIBED THIS ENCOUNTER: Meds ordered this encounter  Medications  . insulin aspart (novoLOG) injection 8 Units    THERAPY PLAN:  Adjuvant chemotherapy  consisting of AC x 4 in a dose dense fashion followed by weekly Paclitaxel x 12.  She will then undergo XRT.  All questions were answered. The patient knows to call the clinic with any problems, questions or concerns. We can certainly see the patient much sooner if necessary.  Patient and plan discussed with Dr. Ancil Linsey and she is in agreement with the aforementioned.   This note is electronically signed by: Doy Mince 09/03/2015 10:25 PM

## 2015-09-03 NOTE — Patient Instructions (Signed)
[place] at [facility] Discharge Instructions  RECOMMENDATIONS MADE BY THE CONSULTANT AND ANY TEST RESULTS WILL BE SENT TO YOUR REFERRING PHYSICIAN.  You were seen by Gershon Mussel today. Return to center as schedule  Thank you for choosing [place] at [facility] to provide your oncology and hematology care.  To afford each patient quality time with our provider, please arrive at least 15 minutes before your scheduled appointment time.   Beginning January 23rd 2017 lab work for the [facility] will be done in the  Main lab at [facility] on 1st floor. If you have a lab appointment with the [facility] please come in thru the  Main Entrance and check in at the main information desk  You need to re-schedule your appointment should you arrive 10 or more minutes late.  We strive to give you quality time with our providers, and arriving late affects you and other patients whose appointments are after yours.  Also, if you no show three or more times for appointments you may be dismissed from the clinic at the providers discretion.     Again, thank you for choosing [facility].  Our hope is that these requests will decrease the amount of time that you wait before being seen by our physicians.       _____________________________________________________________  Should you have questions after your visit to [facility], please contact our office at [phone] between the hours of 8:30 a.m. and 4:30 p.m.  Voicemails left after 4:30 p.m. will not be returned until the following business day.  For prescription refill requests, have your pharmacy contact our office.         Resources For Cancer Patients and their Caregivers ? American Cancer Society: Can assist with transportation, wigs, general needs, runs Look Good Feel Better.        [phone] ? Cancer Care: Provides financial assistance, online support groups,  medication/co-pay assistance.  1-800-813-HOPE 681-316-6319) ? [place] Assists [place] cancer patients and their families through emotional , educational and financial support.  650-404-7444 ? [place] DSS Where to apply for food stamps, Medicaid and utility assistance. 571-201-0787 ? RCATS: Transportation to medical appointments. 779-027-3168 ? Social Security Administration: May apply for disability if have a Stage IV cancer. 249-706-1657 403-863-0565 ? [employer], Disability and Transit Services: Assists with nutrition, care and transit needs. [place] Support Programs: @10RELATIVEDAYS @ > Cancer Support Group  2nd Tuesday of the month 1pm-2pm, Journey Room  > Creative Journey  3rd Tuesday of the month 1130am-1pm, Journey Room  > Look Good Feel Better  1st Wednesday of the month 10am-12 noon, Journey Room (Call [place] to register [phone])

## 2015-09-06 LAB — AEROBIC CULTURE W GRAM STAIN (SUPERFICIAL SPECIMEN)

## 2015-09-06 LAB — AEROBIC CULTURE  (SUPERFICIAL SPECIMEN): GRAM STAIN: NONE SEEN

## 2015-09-09 ENCOUNTER — Other Ambulatory Visit (HOSPITAL_COMMUNITY): Payer: Self-pay | Admitting: Oncology

## 2015-09-09 ENCOUNTER — Telehealth (HOSPITAL_COMMUNITY): Payer: Self-pay | Admitting: *Deleted

## 2015-09-09 MED ORDER — HYDROCODONE-ACETAMINOPHEN 5-325 MG PO TABS: 1.0000 | ORAL_TABLET | ORAL | 0 refills | Status: DC | PRN

## 2015-09-09 NOTE — Telephone Encounter (Signed)
States that since yesterday she has been having a lot of pain all over. Pt states that she is taking ibuprofen and tylenol and it helps for a little while but then the pain comes back. Pt states that she is having chills at times, but unsure if she is running a fever. Last visit was last Thursday. Next appointment is tomorrow. No new medications since last visit.   I spoke with Gershon Mussel, he advised that it may be one of the medications that the pt is taking causing her pain. He advised if the pt doesn't have any more Hydrocodone left to refill the medication and have the pt come by and pick the medication up.    Pt advised of above and aware that Rx will be at front desk to be picked up.

## 2015-09-10 ENCOUNTER — Encounter (HOSPITAL_BASED_OUTPATIENT_CLINIC_OR_DEPARTMENT_OTHER): Payer: Medicaid Other | Admitting: Hematology & Oncology

## 2015-09-10 ENCOUNTER — Other Ambulatory Visit (HOSPITAL_COMMUNITY): Payer: Self-pay | Admitting: Oncology

## 2015-09-10 ENCOUNTER — Encounter (HOSPITAL_COMMUNITY): Payer: Self-pay | Admitting: Hematology & Oncology

## 2015-09-10 ENCOUNTER — Encounter (HOSPITAL_COMMUNITY): Payer: Medicaid Other | Attending: Adult Health

## 2015-09-10 ENCOUNTER — Encounter (HOSPITAL_COMMUNITY): Payer: Self-pay | Admitting: Oncology

## 2015-09-10 VITALS — BP 118/65 | HR 91 | Temp 98.6°F | Resp 18 | Wt 172.2 lb

## 2015-09-10 VITALS — BP 122/72 | HR 93 | Temp 99.0°F | Resp 16 | Wt 172.1 lb

## 2015-09-10 DIAGNOSIS — C50919 Malignant neoplasm of unspecified site of unspecified female breast: Secondary | ICD-10-CM

## 2015-09-10 DIAGNOSIS — E108 Type 1 diabetes mellitus with unspecified complications: Secondary | ICD-10-CM | POA: Insufficient documentation

## 2015-09-10 DIAGNOSIS — L732 Hidradenitis suppurativa: Secondary | ICD-10-CM | POA: Diagnosis not present

## 2015-09-10 DIAGNOSIS — M791 Myalgia, unspecified site: Secondary | ICD-10-CM

## 2015-09-10 DIAGNOSIS — C50411 Malignant neoplasm of upper-outer quadrant of right female breast: Secondary | ICD-10-CM | POA: Insufficient documentation

## 2015-09-10 DIAGNOSIS — D701 Agranulocytosis secondary to cancer chemotherapy: Secondary | ICD-10-CM

## 2015-09-10 DIAGNOSIS — K219 Gastro-esophageal reflux disease without esophagitis: Secondary | ICD-10-CM | POA: Insufficient documentation

## 2015-09-10 DIAGNOSIS — Z9889 Other specified postprocedural states: Secondary | ICD-10-CM | POA: Insufficient documentation

## 2015-09-10 DIAGNOSIS — D500 Iron deficiency anemia secondary to blood loss (chronic): Secondary | ICD-10-CM | POA: Diagnosis not present

## 2015-09-10 DIAGNOSIS — T451X5A Adverse effect of antineoplastic and immunosuppressive drugs, initial encounter: Secondary | ICD-10-CM

## 2015-09-10 DIAGNOSIS — Z808 Family history of malignant neoplasm of other organs or systems: Secondary | ICD-10-CM | POA: Insufficient documentation

## 2015-09-10 DIAGNOSIS — Z5111 Encounter for antineoplastic chemotherapy: Secondary | ICD-10-CM | POA: Diagnosis not present

## 2015-09-10 DIAGNOSIS — L0291 Cutaneous abscess, unspecified: Secondary | ICD-10-CM

## 2015-09-10 HISTORY — DX: Adverse effect of antineoplastic and immunosuppressive drugs, initial encounter: T45.1X5A

## 2015-09-10 HISTORY — DX: Agranulocytosis secondary to cancer chemotherapy: D70.1

## 2015-09-10 LAB — CBC WITH DIFFERENTIAL/PLATELET
BAND NEUTROPHILS: 0 %
BAND NEUTROPHILS: 0 %
BASOS ABS: 0 10*3/uL (ref 0.0–0.1)
BASOS ABS: 0 10*3/uL (ref 0.0–0.1)
BASOS PCT: 0 %
BLASTS: 0 %
Basophils Relative: 0 %
Blasts: 0 %
EOS ABS: 0 10*3/uL (ref 0.0–0.7)
EOS ABS: 0 10*3/uL (ref 0.0–0.7)
EOS PCT: 0 %
Eosinophils Relative: 0 %
HCT: 29.9 % — ABNORMAL LOW (ref 36.0–46.0)
HCT: 29 % — ABNORMAL LOW (ref 36.0–46.0)
HEMOGLOBIN: 9.9 g/dL — AB (ref 12.0–15.0)
HEMOGLOBIN: 9.5 g/dL — AB (ref 12.0–15.0)
LYMPHS PCT: 66 %
Lymphocytes Relative: 77 %
Lymphs Abs: 1.1 10*3/uL (ref 0.7–4.0)
Lymphs Abs: 1.2 10*3/uL (ref 0.7–4.0)
MCH: 27.6 pg (ref 26.0–34.0)
MCH: 27.2 pg (ref 26.0–34.0)
MCHC: 33.1 g/dL (ref 30.0–36.0)
MCHC: 32.8 g/dL (ref 30.0–36.0)
MCV: 83.3 fL (ref 78.0–100.0)
MCV: 83.1 fL (ref 78.0–100.0)
MONO ABS: 0.4 10*3/uL (ref 0.1–1.0)
MYELOCYTES: 0 %
Metamyelocytes Relative: 0 %
Metamyelocytes Relative: 0 %
Monocytes Absolute: 0.5 10*3/uL (ref 0.1–1.0)
Monocytes Relative: 33 %
Monocytes Relative: 22 %
Myelocytes: 0 %
NEUTROS PCT: 1 %
Neutro Abs: 0 10*3/uL — ABNORMAL LOW (ref 1.7–7.7)
Neutro Abs: 0 10*3/uL — ABNORMAL LOW (ref 1.7–7.7)
Neutrophils Relative %: 1 %
OTHER: 0 %
PLATELETS: 230 10*3/uL (ref 150–400)
PROMYELOCYTES ABS: 0 %
PROMYELOCYTES ABS: 0 %
Platelets: 245 10*3/uL (ref 150–400)
RBC: 3.59 MIL/uL — ABNORMAL LOW (ref 3.87–5.11)
RBC: 3.49 MIL/uL — ABNORMAL LOW (ref 3.87–5.11)
RDW: 16.8 % — ABNORMAL HIGH (ref 11.5–15.5)
RDW: 16.8 % — ABNORMAL HIGH (ref 11.5–15.5)
WBC: 1.6 10*3/uL — ABNORMAL LOW (ref 4.0–10.5)
WBC: 1.6 10*3/uL — ABNORMAL LOW (ref 4.0–10.5)
nRBC: 0 /100 WBC
nRBC: 0 /100 WBC

## 2015-09-10 LAB — COMPREHENSIVE METABOLIC PANEL
ALBUMIN: 3.7 g/dL (ref 3.5–5.0)
ALK PHOS: 70 U/L (ref 38–126)
ALT: 18 U/L (ref 14–54)
AST: 20 U/L (ref 15–41)
Anion gap: 10 (ref 5–15)
BUN: 8 mg/dL (ref 6–20)
CALCIUM: 8.7 mg/dL — AB (ref 8.9–10.3)
CO2: 25 mmol/L (ref 22–32)
CREATININE: 0.48 mg/dL (ref 0.44–1.00)
Chloride: 99 mmol/L — ABNORMAL LOW (ref 101–111)
GFR calc Af Amer: >60 mL/min (ref >60)
GFR calc non Af Amer: >60 mL/min (ref >60)
GLUCOSE: 247 mg/dL — AB (ref 65–99)
Potassium: 3.5 mmol/L (ref 3.5–5.1)
SODIUM: 134 mmol/L — AB (ref 135–145)
Total Bilirubin: 1 mg/dL (ref 0.3–1.2)
Total Protein: 6.7 g/dL (ref 6.5–8.1)

## 2015-09-10 MED ORDER — FILGRASTIM 480 MCG/0.8ML IJ SOSY
480.0000 ug | PREFILLED_SYRINGE | Freq: Once | INTRAMUSCULAR | Status: AC
  Administered 2015-09-10: 480 ug via SUBCUTANEOUS
  Filled 2015-09-10: qty 0.8

## 2015-09-10 MED ORDER — CYCLOBENZAPRINE HCL 5 MG PO TABS: 5.0000 mg | ORAL_TABLET | Freq: Three times a day (TID) | ORAL | 1 refills | Status: DC | PRN

## 2015-09-10 MED ORDER — SODIUM CHLORIDE 0.9% FLUSH
20.0000 mL | INTRAVENOUS | Status: DC | PRN
Start: 2015-09-10 — End: 2015-09-10
  Administered 2015-09-10: 20 mL via INTRAVENOUS
  Filled 2015-09-10: qty 20

## 2015-09-10 MED ORDER — HEPARIN SOD (PORK) LOCK FLUSH 100 UNIT/ML IV SOLN
INTRAVENOUS | Status: AC
  Filled 2015-09-10: qty 5

## 2015-09-10 MED ORDER — CEPHALEXIN 500 MG PO CAPS: 500.0000 mg | ORAL_CAPSULE | Freq: Three times a day (TID) | ORAL | 1 refills | Status: DC

## 2015-09-10 MED ORDER — HEPARIN SOD (PORK) LOCK FLUSH 100 UNIT/ML IV SOLN
500.0000 [IU] | Freq: Once | INTRAVENOUS | Status: AC
  Administered 2015-09-10: 500 [IU] via INTRAVENOUS

## 2015-09-10 NOTE — Progress Notes (Signed)
Brittney [name]'s chemo tx was held today and Neupogen was given per MD orders. Lab results shown to Dr. Whitney Muse due to WBC and ANC low. CBCD was repeated and those results were low and shown to Dr. Whitney Muse as well. Orders obtained and pt was given Neupogen and reminded of neutropenic precautions. Pt was cold and temp was rechecked several times but was not above 99. Pt instructed to keep a check on her temp as well.Pt discharged self ambulatory in stable condition accompanied by her mother.

## 2015-09-10 NOTE — Patient Instructions (Signed)
[place] at [facility] Discharge Instructions  RECOMMENDATIONS MADE BY THE CONSULTANT AND ANY TEST RESULTS WILL BE SENT TO YOUR REFERRING PHYSICIAN.  You saw Dr. Whitney Muse today.  Thank you for choosing [place] at [facility] to provide your oncology and hematology care.  To afford each patient quality time with our provider, please arrive at least 15 minutes before your scheduled appointment time.   Beginning January 23rd 2017 lab work for the [facility] will be done in the  Main lab at [facility] on 1st floor. If you have a lab appointment with the [facility] please come in thru the  Main Entrance and check in at the main information desk  You need to re-schedule your appointment should you arrive 10 or more minutes late.  We strive to give you quality time with our providers, and arriving late affects you and other patients whose appointments are after yours.  Also, if you no show three or more times for appointments you may be dismissed from the clinic at the providers discretion.     Again, thank you for choosing [facility].  Our hope is that these requests will decrease the amount of time that you wait before being seen by our physicians.       _____________________________________________________________  Should you have questions after your visit to [facility], please contact our office at [phone] between the hours of 8:30 a.m. and 4:30 p.m.  Voicemails left after 4:30 p.m. will not be returned until the following business day.  For prescription refill requests, have your pharmacy contact our office.         Resources For Cancer Patients and their Caregivers ? American Cancer Society: Can assist with transportation, wigs, general needs, runs Look Good Feel Better.        361-707-4508 ? Cancer Care: Provides financial assistance, online support groups, medication/co-pay assistance.   1-800-813-HOPE 7173835110) ? [place] Assists [place] cancer patients and their families through emotional , educational and financial support.  424-409-3832 ? [place] DSS Where to apply for food stamps, Medicaid and utility assistance. 8100158837 ? [employer]ATS: Transportation to medical appointments. 408-250-8104 ? Social Security Administration: May apply for disability if have a Stage IV cancer. [phone] ? [employer], Disability and Transit Services: Assists with nutrition, care and transit needs. [place] Support Programs: @10RELATIVEDAYS @ > Cancer Support Group  2nd Tuesday of the month 1pm-2pm, Journey Room  > Creative Journey  3rd Tuesday of the month 1130am-1pm, Journey Room  > Look Good Feel Better  1st Wednesday of the month 10am-12 noon, Journey Room (Call [place] to register [phone])

## 2015-09-10 NOTE — Patient Instructions (Signed)
[place] at [facility] Discharge Instructions  RECOMMENDATIONS MADE BY THE CONSULTANT AND ANY TEST RESULTS WILL BE SENT TO YOUR REFERRING PHYSICIAN.  Chemo tx held today due to low white cells. Neupogen given and pt reminded of neutropenic precautions. Follow-up as scheduled. Call clinic for any questions or concerns  Thank you for choosing [place] at [facility] to provide your oncology and hematology care.  To afford each patient quality time with our provider, please arrive at least 15 minutes before your scheduled appointment time.   Beginning January 23rd 2017 lab work for the [facility] will be done in the  Main lab at [facility] on 1st floor. If you have a lab appointment with the [place] please come in thru the  Main Entrance and check in at the main information desk  You need to re-schedule your appointment should you arrive 10 or more minutes late.  We strive to give you quality time with our providers, and arriving late affects you and other patients whose appointments are after yours.  Also, if you no show three or more times for appointments you may be dismissed from the clinic at the providers discretion.     Again, thank you for choosing [facility].  Our hope is that these requests will decrease the amount of time that you wait before being seen by our physicians.       _____________________________________________________________  Should you have questions after your visit to [facility], please contact our office at [phone] between the hours of 8:30 a.m. and 4:30 p.m.  Voicemails left after 4:30 p.m. will not be returned until the following business day.  For prescription refill requests, have your pharmacy contact our office.         Resources For Cancer Patients and their Caregivers ? American Cancer Society: Can assist with transportation, wigs, general needs, runs Look Good  Feel Better.        [phone] ? Cancer Care: Provides financial assistance, online support groups, medication/co-pay assistance.  1-800-813-HOPE 423-875-9689) ? [place] Assists [place] cancer patients and their families through emotional , educational and financial support.  3363331481 ? [place] DSS Where to apply for food stamps, Medicaid and utility assistance. 512-648-2951 ? RCATS: Transportation to medical appointments. ([phone] ? Social Security Administration: May apply for disability if have a Stage IV cancer. 519-502-7357 [phone] ? [employer], Disability and Transit Services: Assists with nutrition, care and transit needs. [place] Support Programs: @10RELATIVEDAYS @ > Cancer Support Group  2nd Tuesday of the month 1pm-2pm, Journey Room  > Creative Journey  3rd Tuesday of the month 1130am-1pm, Journey Room  > Look Good Feel Better  1st Wednesday of the month 10am-12 noon, Journey Room (Call [place] to register [phone])

## 2015-09-10 NOTE — Progress Notes (Signed)
[place]  PROGRESS NOTE  Patient Care Team: [name], MD as PCP - General (Unknown Physician Specialty)  CHIEF COMPLAINTS/PURPOSE OF CONSULTATION:     Breast cancer of upper-outer quadrant of right female breast ([place])   04/29/2015 Initial Biopsy    upper outer quadrant mass R breast 2.9 cm by ultrasound, high grade invasive ductal carcinoma ER< 1%, PR 1%, HER -2 not amplified, Ki-67 66%      05/13/2015 Cancer Staging    pT1cN0      05/13/2015 Surgery    lumpectomy and sentinel node biopsies X 3, 2 cm tumor, 3 negative nodes       05/21/2015 Imaging    CT [facility] with postsurgical changes related to R breast lympectomy and R axillary LN dissection, no findings suspicious for metastatic disease      06/12/2015 Procedure    Port placement by Dr. Anthony Sar      06/17/2015 Imaging    MUGA- Normal left ventricular ejection fraction equal to 62.9%.      06/25/2015 - 08/13/2015 Chemotherapy    AC x 4      08/27/2015 -  Chemotherapy    Weekly Taxol        HISTORY OF PRESENTING ILLNESS:  Brittney [name] 45 y.o. 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  Brittney [name] is accompanied by her mother. She says she is experiencing constant, severe musculoskeletal pain.   Brittney [name] says that the first cycle of paclitaxel was initially not too bad. However, Monday evening she began to feel much worse. She says the pain was so severe that she started to feel nauseas. [name] notes that the week before she did experience intermittent, mild pain but the pain she began to feel on Monday was constant and persistent. She says that it was hard to stand, walk, or do anything at all.  Brittney [name] says she hates being in all of this pain and it is so severe that it is suppressing her appetite.   Patient is still using Hibiclens. She continues to have problems with her hidradenitis.   She says that  her left underarm is also in excruciating pain.  She was given Keflex for hidradenitis and says it worked while she was taking it. Patient finished antibiotic.  She has seen Dr. Anthony Sar for her ongoing problems with her axillary region. We have also cultured her.  She currently denies fever.    MEDICAL HISTORY:  Past Medical History:  Diagnosis Date  . Breast cancer ([place])   . Breast cancer of upper-outer quadrant of right female breast ([place]) 06/11/2015   Triple negative, 2 cm   . Chemotherapy induced neutropenia ([place]) 09/10/2015  . Diabetes mellitus ([place])    Type 1 over 15 yrs  . Environmental allergies   . GERD (gastroesophageal reflux disease)   . Iron deficiency anemia due to chronic blood loss 06/12/2015  . 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 CESAREAN SECTION    . COLONOSCOPY N/A 10/09/2013   Procedure: COLONOSCOPY;  Surgeon: Rogene Houston, MD;  Location: [facility];  Service: Endoscopy;  Laterality: N/A;  100  . DILATION AND CURETTAGE OF UTERUS     x 2 for miscarriages  . ESOPHAGOGASTRODUODENOSCOPY    . FLEXIBLE SIGMOIDOSCOPY    . FLEXIBLE SIGMOIDOSCOPY  07/27/2011  Procedure: FLEXIBLE SIGMOIDOSCOPY;  Surgeon: Rogene Houston, MD;  Location: [facility] SU[facility];  Service: Endoscopy;  Laterality: N/A;  100    SOCIAL HISTORY: Social History   Social History  . Marital status: Single    Spouse name: N/A  . Number of children: N/A  . Years of education: N/A   Occupational History  . Not on file.   Social History Main Topics  . Smoking status: Never Smoker  . Smokeless tobacco: Never Used  . Alcohol use No  . Drug use: No  . Sexual activity: Not on file   Other Topics Concern  . Not on file   Social History Narrative  . No narrative on file   She is single. Has a 71 year old daughter. Aged 35. Works in home health care. Also going to school. Has to sit  out her very last term in order to do her chemotherapy. Working toward RNA/CNA.  She does not smoke.  She loves to read, spend time with her family. She used to like to shop but doesnm't have a lot of money to spend now, so it's at the bottom of the list.   FAMILY HISTORY: Family History  Problem Relation Age of Onset  . Non-Hodgkin's lymphoma Mother   . Hypertension Mother   . Diabetes Mellitus II Mother   . Hypertension Father   . Diabetes Mellitus II Father   . Colon cancer Cousin   . Breast cancer Maternal Aunt    Mom diagnosed in 2013 with non-hodgkin's lymphoma. Had chemotherapy, radiation, more chemotherapy, and stem-cell at [facility] using her cells. Her mother has been in remission since. Mother is aged 31 this month (June 20th, 2017). Father is 58 as well; has high blood pressure. PTSD. Has been treated at the [place] in [place]. She believes he has high cholesterol too, like her mother. Otherwise, he is "alright I guess."  Has an older brother, and a younger sister. No one else in her immediate family with cancer.   ALLERGIES:  is allergic to ace inhibitors; adhesive [tape]; pravastatin sodium; statins; peanuts [peanut oil]; and shellfish allergy.  MEDICATIONS:  No current facility-administered medications for this visit.    No current outpatient prescriptions on file.   Facility-Administered Medications Ordered in Other Visits  Medication Dose Route Frequency Provider Last Rate Last Dose  . 0.9 % NaCl with KCl 20 mEq/ L  infusion   Intravenous Continuous Wallis Bamberg, MD 100 mL/hr at 09/13/15 [phone]    . acetaminophen (TYLENOL) tablet 650 mg  650 mg Oral Q6H PRN Wallis Bamberg, MD   650 mg at 09/12/15 1625   Or  . acetaminophen (TYLENOL) suppository 650 mg  650 mg Rectal Q6H PRN Wallis Bamberg, MD      . acyclovir (ZOVIRAX) 200 MG capsule 200 mg  200 mg Oral BID Wallis Bamberg, MD   200 mg at 09/13/15 0900  . bisacodyl (DULCOLAX) EC  tablet 5 mg  5 mg Oral Daily PRN Wallis Bamberg, MD      . clotrimazole (LOTRIMIN) 1 % cream   Topical BID Wallis Bamberg, MD      . cyclobenzaprine (FLEXERIL) tablet 5 mg  5 mg Oral TID PRN Wallis Bamberg, MD      . enoxaparin (LOVENOX) injection 30 mg  30 mg Subcutaneous Q24H Wallis Bamberg, MD      . filgrastim (NEUPOGEN) injection 480 mcg  480 mcg Subcutaneous Daily Yousif Edelson K Trystyn Dolley,  MD   480 mcg at 09/13/15 1406  . FLUoxetine (PROZAC) capsule 20 mg  20 mg Oral Daily Wallis Bamberg, MD   20 mg at 09/13/15 0900  . gabapentin (NEURONTIN) capsule 100 mg  100 mg Oral TID Wallis Bamberg, MD   100 mg at 09/13/15 0900  . HYDROcodone-acetaminophen (NORCO/VICODIN) 5-325 MG per tablet 1 tablet  1 tablet Oral Q4H PRN Wallis Bamberg, MD   1 tablet at 09/12/15 0845  . ibuprofen (ADVIL,MOTRIN) tablet 400 mg  400 mg Oral Q6H PRN Wallis Bamberg, MD   400 mg at 09/12/15 1807  . insulin aspart (novoLOG) injection 0-15 Units  0-15 Units Subcutaneous TID [facility] Wallis Bamberg, MD   2 Units at 09/13/15 1223  . insulin aspart protamine- aspart (NOVOLOG MIX 70/30) injection 4 Units  4 Units Subcutaneous TID WC Wallis Bamberg, MD   4 Units at 09/13/15 1200  . insulin glargine (LANTUS) injection 20 Units  20 Units Subcutaneous QHS Wallis Bamberg, MD   20 Units at 09/12/15 2234  . LORazepam (ATIVAN) tablet 0.5 mg  0.5 mg Oral Q8H Wallis Bamberg, MD   0.5 mg at 09/13/15 1407  . meropenem (MERREM) 1 g in sodium chloride 0.9 % 100 mL IVPB  1 g Intravenous Q12H Wallis Bamberg, MD      . mupirocin ointment (BACTROBAN) 2 % 1 application  1 application Nasal BID PRN Wallis Bamberg, MD      . ondansetron (ZOFRAN-ODT) disintegrating tablet 8 mg  8 mg Oral Q8H PRN Wallis Bamberg, MD   8 mg at 09/12/15 1625  . pantoprazole (PROTONIX) EC tablet 40 mg  40 mg Oral Daily Wallis Bamberg, MD   40 mg at 09/13/15 0900  . polyethylene glycol (MIRALAX / GLYCOLAX)  packet 8.5 g  8.5 g Oral Daily PRN Wallis Bamberg, MD      . prochlorperazine (COMPAZINE) tablet 10 mg  10 mg Oral Q6H PRN Wallis Bamberg, MD      . promethazine (PHENERGAN) tablet 25 mg  25 mg Oral Q6H PRN Wallis Bamberg, MD      . sodium chloride flush (NS) 0.9 % injection 3 mL  3 mL Intravenous Q12H Wallis Bamberg, MD   3 mL at 09/12/15 2235    Review of Systems  Constitutional: Negative.  Negative for chills, fever, malaise/fatigue and weight loss.  HENT: Negative.  Negative for congestion, hearing loss, nosebleeds, sore throat and tinnitus.   Eyes: Negative.  Negative for blurred vision, double vision, pain and discharge.  Respiratory: Negative.  Negative for cough, hemoptysis, sputum production, shortness of breath and wheezing.   Cardiovascular: Negative.  Negative for chest pain, palpitations, claudication, leg swelling and PND.  Gastrointestinal: Negative.  Negative for abdominal pain, blood in stool, constipation, diarrhea, heartburn, melena, nausea and vomiting.  Genitourinary: Negative.  Negative for dysuria, frequency, hematuria and urgency.  Musculoskeletal: Positive for back pain and joint pain. Negative for falls and myalgias.       Musculoskeletal pain from taxol  Skin: Negative.  Negative for itching and rash.  Neurological: Negative.  Negative for dizziness, tingling, tremors, sensory change, speech change, focal weakness, seizures, loss of consciousness, weakness and headaches.  Endo/Heme/Allergies: Negative.  Does not bruise/bleed easily.  Psychiatric/Behavioral: Negative.  Negative for depression, memory loss, substance abuse and suicidal ideas. The patient is not nervous/anxious and does not have insomnia.   All other systems reviewed and are negative.  14 point ROS was done and is otherwise as detailed above or in HPI   PHYSICAL EXAMINATION: ECOG PERFORMANCE STATUS: 1 - Symptomatic but completely ambulatory  Vitals:   09/10/15 1245  BP: 122/72   Pulse: 93  Resp: 16  Temp: 99 F (37.2 C)   Filed Weights   09/10/15 1245  Weight: 172 lb 1.6 oz (78.1 kg)    Physical Exam  Constitutional: She is oriented to person, place, and time and well-developed, well-nourished, and in no distress.  alopecia  HENT:  Head: Normocephalic and atraumatic.  Nose: Nose normal.  Mouth/Throat: Oropharynx is clear and moist. No oropharyngeal exudate.  Eyes: Conjunctivae and EOM are normal. Pupils are equal, round, and reactive to light. Right eye exhibits no discharge. Left eye exhibits no discharge. No scleral icterus.  Neck: Normal range of motion. Neck supple. No tracheal deviation present. No thyromegaly present.  Cardiovascular: Normal rate, regular rhythm and normal heart sounds.  Exam reveals no gallop and no friction rub.   No murmur heard. Pulmonary/Chest: Effort normal and breath sounds normal. She has no wheezes. She has no rales.  Abdominal: Soft. Bowel sounds are normal. She exhibits no distension and no mass. There is no tenderness. There is no rebound and no guarding.  Musculoskeletal: Normal range of motion. She exhibits no edema.  Lymphadenopathy:    She has no cervical adenopathy.  Neurological: She is alert and oriented to person, place, and time. She has normal reflexes. No cranial nerve deficit. Gait normal. Coordination normal.  Skin: Skin is warm and dry. No rash noted.  Bilateral axillary regions with small superficial comedones  Psychiatric: Mood, memory, affect and judgment normal.  Nursing note and vitals reviewed.    LABORATORY DATA: I have reviewed the data as listed.  Results for RICA, HEATHER (MRN [id number]) as of 09/10/2015 09:35 Results for EDLIN, FORD (MRN [id number]) as of 09/13/2015 15:48  Ref. Range 09/10/2015 10:47  Sodium Latest Ref Range: 135 - 145 mmol/L 134 (L)  Potassium Latest Ref Range: 3.5 - 5.1 mmol/L 3.5  Chloride Latest Ref Range: 101 - 111 mmol/L 99 (L)  CO2 Latest Ref Range: 22 -  32 mmol/L 25  BUN Latest Ref Range: 6 - 20 mg/dL 8  Creatinine Latest Ref Range: 0.44 - 1.00 mg/dL 0.48  Calcium Latest Ref Range: 8.9 - 10.3 mg/dL 8.7 (L)  EGFR (Non-African Amer.) Latest Ref Range: >60 mL/min >60  EGFR (African American) Latest Ref Range: >60 mL/min >60  Glucose Latest Ref Range: 65 - 99 mg/dL 247 (H)  Anion gap Latest Ref Range: 5 - 15  10  Alkaline Phosphatase Latest Ref Range: 38 - 126 U/L 70  Albumin Latest Ref Range: 3.5 - 5.0 g/dL 3.7  AST Latest Ref Range: 15 - 41 U/L 20  ALT Latest Ref Range: 14 - 54 U/L 18  Total Protein Latest Ref Range: 6.5 - 8.1 g/dL 6.7  Total Bilirubin Latest Ref Range: 0.3 - 1.2 mg/dL 1.0  WBC Latest Ref Range: 4.0 - 10.5 K/uL 1.6 (L)  RBC Latest Ref Range: 3.87 - 5.11 MIL/uL 3.59 (L)  Hemoglobin Latest Ref Range: 12.0 - 15.0 g/dL 9.9 (L)  HCT Latest Ref Range: 36.0 - 46.0 % 29.9 (L)  MCV Latest Ref Range: 78.0 - 100.0 fL 83.3  MCH Latest Ref Range: 26.0 - 34.0 pg 27.6  MCHC Latest Ref Range: 30.0 - 36.0 g/dL 33.1  RDW Latest Ref Range: 11.5 - 15.5 % 16.8 (H)  Platelets Latest  Ref Range: 150 - 400 K/uL 245  Neutrophils Latest Units: % 1  Lymphocytes Latest Units: % 66  Monocytes Relative Latest Units: % 33  Eosinophil Latest Units: % 0  Basophil Latest Units: % 0  NEUT# Latest Ref Range: 1.7 - 7.7 K/uL 0.0 (L)  Lymphocyte # Latest Ref Range: 0.7 - 4.0 K/uL 1.1  Monocyte # Latest Ref Range: 0.1 - 1.0 K/uL 0.5  Eosinophils Absolute Latest Ref Range: 0.0 - 0.7 K/uL 0.0  Basophils Absolute Latest Ref Range: 0.0 - 0.1 K/uL 0.0  WBC Morphology Unknown WHITE COUNT CONFI...  Smear Review Unknown LARGE PLATELETS P...  Myelocytes Latest Units: % 0  Metamyelocytes Relative Latest Units: % 0  Promyelocytes Absolute Latest Units: % 0  Blasts Latest Units: % 0  nRBC Latest Ref Range: 0 /100 WBC 0  Band Neutrophils Latest Units: % 0  Other Latest Units: % 0   RADIOGRAPHY: I have reviewed the data as listed below.  Study Result    CLINICAL DATA:  Right-sided breast cancer.  EXAM: NUCLEAR MEDICINE CARDIAC BLOOD POOL IMAGING (MUGA)  TECHNIQUE: Cardiac multi-gated acquisition was performed at rest following intravenous injection of Tc-10mlabeled red blood cells.  RADIOPHARMACEUTICALS:  25.0 mCi Tc-961mDP in-vitro labeled red blood cells IV  COMPARISON:  None.  FINDINGS: The left ventricular ejection fraction is equal to 62.9%. On 10/11/2012 this equaled 57%. Normal left ventricular wall motion.  IMPRESSION: 1. Normal left ventricular ejection fraction equal to 62.9%.   Electronically Signed   By: TaKerby Moors.D.   On: 06/17/2015 15:19    PATHOLOGY:     ASSESSMENT & PLAN:  pT1cN0 triple negative carcinoma of the R breast S/P lumpectomy and sentinel LN procedure Iron deficiency Anemia Ulcerative Colitis followed by Dr. ReLaural Goldenngoing Menses Anemia Negative [facility] In[facility]otherapy induced neutropenia Taxol induced musculoskeletal pain hidradenitis  Severe neutropenia noted. Treatment will be held today. We discussed the following: 1. Adding muscle relaxers for her musculoskeletal pain 2. Changing the way we are giving her taxol to dose dense vs. Q 3 weeks. 3. We will add neuopogen and see if she can be treated next week, however if she wishes to change the way therapy is administered we can regroup next wee 4. We reviewed neutropenia and fever  I told the patient to continue to use hibiclens several times weekly. I have put her on another course of keflex.   RTC 1 week.  All questions were answered. The patient knows to call the clinic with any problems, questions or concerns.  This document serves as a record of services personally performed by ShAncil LinseyMD. It was created on her behalf by DaElmyra Ricksa trained medical scribe. The creation of this record is based on the scribe's personal observations and the provider's statements to them. This document has  been checked and approved by the attending provider.  I have reviewed the above documentation for accuracy and completeness and I agree with the above.  This note was electronically signed.  PeMolli HazardMD  09/13/2015 3:42 PM

## 2015-09-11 ENCOUNTER — Encounter (HOSPITAL_COMMUNITY): Payer: Self-pay | Admitting: Family Medicine

## 2015-09-11 ENCOUNTER — Inpatient Hospital Stay (HOSPITAL_COMMUNITY)
Admission: EM | Admit: 2015-09-11 | Discharge: 2015-09-15 | DRG: 809 | Disposition: A | Discharge status: Home / Self Care | Payer: Medicaid Other | Attending: Family Medicine | Admitting: Family Medicine

## 2015-09-11 ENCOUNTER — Encounter (HOSPITAL_BASED_OUTPATIENT_CLINIC_OR_DEPARTMENT_OTHER): Payer: Medicaid Other

## 2015-09-11 ENCOUNTER — Encounter (HOSPITAL_COMMUNITY): Payer: Self-pay

## 2015-09-11 ENCOUNTER — Emergency Department (HOSPITAL_COMMUNITY): Payer: Medicaid Other

## 2015-09-11 VITALS — BP 123/67 | HR 104 | Temp 101.3°F | Resp 18

## 2015-09-11 DIAGNOSIS — T451X5A Adverse effect of antineoplastic and immunosuppressive drugs, initial encounter: Principal | ICD-10-CM

## 2015-09-11 DIAGNOSIS — K219 Gastro-esophageal reflux disease without esophagitis: Secondary | ICD-10-CM | POA: Diagnosis not present

## 2015-09-11 DIAGNOSIS — N179 Acute kidney failure, unspecified: Secondary | ICD-10-CM | POA: Diagnosis present

## 2015-09-11 DIAGNOSIS — F329 Major depressive disorder, single episode, unspecified: Secondary | ICD-10-CM | POA: Diagnosis present

## 2015-09-11 DIAGNOSIS — F32A Depression, unspecified: Secondary | ICD-10-CM | POA: Diagnosis present

## 2015-09-11 DIAGNOSIS — E109 Type 1 diabetes mellitus without complications: Secondary | ICD-10-CM | POA: Diagnosis present

## 2015-09-11 DIAGNOSIS — C50411 Malignant neoplasm of upper-outer quadrant of right female breast: Secondary | ICD-10-CM

## 2015-09-11 DIAGNOSIS — D701 Agranulocytosis secondary to cancer chemotherapy: Secondary | ICD-10-CM | POA: Diagnosis present

## 2015-09-11 DIAGNOSIS — E108 Type 1 diabetes mellitus with unspecified complications: Secondary | ICD-10-CM | POA: Diagnosis not present

## 2015-09-11 DIAGNOSIS — Z8249 Family history of ischemic heart disease and other diseases of the circulatory system: Secondary | ICD-10-CM | POA: Diagnosis not present

## 2015-09-11 DIAGNOSIS — R509 Fever, unspecified: Secondary | ICD-10-CM | POA: Diagnosis not present

## 2015-09-11 DIAGNOSIS — Z794 Long term (current) use of insulin: Secondary | ICD-10-CM

## 2015-09-11 DIAGNOSIS — Z800 Family history of malignant neoplasm of digestive organs: Secondary | ICD-10-CM

## 2015-09-11 DIAGNOSIS — D709 Neutropenia, unspecified: Secondary | ICD-10-CM | POA: Diagnosis present

## 2015-09-11 DIAGNOSIS — Z807 Family history of other malignant neoplasms of lymphoid, hematopoietic and related tissues: Secondary | ICD-10-CM | POA: Diagnosis not present

## 2015-09-11 DIAGNOSIS — R5081 Fever presenting with conditions classified elsewhere: Secondary | ICD-10-CM | POA: Diagnosis present

## 2015-09-11 DIAGNOSIS — Z833 Family history of diabetes mellitus: Secondary | ICD-10-CM

## 2015-09-11 DIAGNOSIS — K5190 Ulcerative colitis, unspecified, without complications: Secondary | ICD-10-CM | POA: Diagnosis present

## 2015-09-11 DIAGNOSIS — K51919 Ulcerative colitis, unspecified with unspecified complications: Secondary | ICD-10-CM

## 2015-09-11 DIAGNOSIS — Z803 Family history of malignant neoplasm of breast: Secondary | ICD-10-CM

## 2015-09-11 LAB — URINALYSIS, ROUTINE W REFLEX MICROSCOPIC
Glucose, UA: NEGATIVE mg/dL
Hgb urine dipstick: NEGATIVE
Leukocytes, UA: NEGATIVE
NITRITE: NEGATIVE
PH: 6 (ref 5.0–8.0)
Protein, ur: 100 mg/dL — AB
Specific Gravity, Urine: 1.02 (ref 1.005–1.030)

## 2015-09-11 LAB — LACTIC ACID, PLASMA
LACTIC ACID, VENOUS: 1.2 mmol/L (ref 0.5–1.9)
LACTIC ACID, VENOUS: 1.4 mmol/L (ref 0.5–1.9)

## 2015-09-11 LAB — CBC WITH DIFFERENTIAL/PLATELET
BAND NEUTROPHILS: 0 %
BASOS ABS: 0 10*3/uL (ref 0.0–0.1)
BASOS PCT: 0 %
Blasts: 0 %
EOS ABS: 0 10*3/uL (ref 0.0–0.7)
Eosinophils Relative: 0 %
HCT: 29.1 % — ABNORMAL LOW (ref 36.0–46.0)
HEMOGLOBIN: 9.5 g/dL — AB (ref 12.0–15.0)
LYMPHS ABS: 1.3 10*3/uL (ref 0.7–4.0)
Lymphocytes Relative: 38 %
MCH: 27.1 pg (ref 26.0–34.0)
MCHC: 32.6 g/dL (ref 30.0–36.0)
MCV: 83.1 fL (ref 78.0–100.0)
METAMYELOCYTES PCT: 3 %
MONO ABS: 2 10*3/uL — AB (ref 0.1–1.0)
MYELOCYTES: 0 %
Monocytes Relative: 58 %
NEUTROS PCT: 1 %
NRBC: 0 /100{WBCs}
Neutro Abs: 0.1 10*3/uL — ABNORMAL LOW (ref 1.7–7.7)
PROMYELOCYTES ABS: 0 %
Platelets: 250 10*3/uL (ref 150–400)
RBC: 3.5 MIL/uL — ABNORMAL LOW (ref 3.87–5.11)
RDW: 17.2 % — ABNORMAL HIGH (ref 11.5–15.5)
WBC: 3.4 10*3/uL — ABNORMAL LOW (ref 4.0–10.5)

## 2015-09-11 LAB — URINE MICROSCOPIC-ADD ON: RBC / HPF: NONE SEEN RBC/hpf (ref 0–5)

## 2015-09-11 LAB — GLUCOSE, CAPILLARY: Glucose-Capillary: 175 mg/dL — ABNORMAL HIGH (ref 65–99)

## 2015-09-11 LAB — COMPREHENSIVE METABOLIC PANEL
ALK PHOS: 67 U/L (ref 38–126)
ALT: 15 U/L (ref 14–54)
ANION GAP: 7 (ref 5–15)
AST: 17 U/L (ref 15–41)
Albumin: 3.5 g/dL (ref 3.5–5.0)
BUN: 9 mg/dL (ref 6–20)
CALCIUM: 8.4 mg/dL — AB (ref 8.9–10.3)
CO2: 26 mmol/L (ref 22–32)
CREATININE: 0.58 mg/dL (ref 0.44–1.00)
Chloride: 99 mmol/L — ABNORMAL LOW (ref 101–111)
GFR calc Af Amer: >60 mL/min (ref >60)
GFR calc non Af Amer: >60 mL/min (ref >60)
GLUCOSE: 158 mg/dL — AB (ref 65–99)
Potassium: 3.4 mmol/L — ABNORMAL LOW (ref 3.5–5.1)
Sodium: 132 mmol/L — ABNORMAL LOW (ref 135–145)
Total Bilirubin: 1.1 mg/dL (ref 0.3–1.2)
Total Protein: 6.9 g/dL (ref 6.5–8.1)

## 2015-09-11 MED ORDER — ACETAMINOPHEN 325 MG PO TABS
650.0000 mg | ORAL_TABLET | Freq: Once | ORAL | Status: AC | PRN
Start: 2015-09-11 — End: 2015-09-11
  Administered 2015-09-11: 650 mg via ORAL
  Filled 2015-09-11: qty 2

## 2015-09-11 MED ORDER — CYCLOBENZAPRINE HCL 10 MG PO TABS: 5.0000 mg | ORAL_TABLET | Freq: Three times a day (TID) | ORAL | Status: DC | PRN

## 2015-09-11 MED ORDER — FILGRASTIM 480 MCG/0.8ML IJ SOSY
480.0000 ug | PREFILLED_SYRINGE | Freq: Once | INTRAMUSCULAR | Status: AC
  Administered 2015-09-11: 480 ug via SUBCUTANEOUS
  Filled 2015-09-11: qty 0.8

## 2015-09-11 MED ORDER — PROCHLORPERAZINE MALEATE 5 MG PO TABS: 10.0000 mg | ORAL_TABLET | Freq: Four times a day (QID) | ORAL | Status: DC | PRN

## 2015-09-11 MED ORDER — PIPERACILLIN-TAZOBACTAM 3.375 G IVPB 30 MIN: 3.3750 g | Freq: Once | INTRAVENOUS | Status: DC

## 2015-09-11 MED ORDER — GABAPENTIN 100 MG PO CAPS
100.0000 mg | ORAL_CAPSULE | Freq: Three times a day (TID) | ORAL | Status: DC
  Administered 2015-09-11 – 2015-09-15 (×11): 100 mg via ORAL
  Filled 2015-09-11 (×11): qty 1

## 2015-09-11 MED ORDER — PROMETHAZINE HCL 12.5 MG PO TABS: 25.0000 mg | ORAL_TABLET | Freq: Four times a day (QID) | ORAL | Status: DC | PRN

## 2015-09-11 MED ORDER — ACETAMINOPHEN 325 MG PO TABS
650.0000 mg | ORAL_TABLET | Freq: Four times a day (QID) | ORAL | Status: DC | PRN
  Administered 2015-09-12 – 2015-09-15 (×3): 650 mg via ORAL
  Filled 2015-09-11 (×3): qty 2

## 2015-09-11 MED ORDER — ONDANSETRON 4 MG PO TBDP
8.0000 mg | ORAL_TABLET | Freq: Three times a day (TID) | ORAL | Status: DC | PRN
  Administered 2015-09-12: 8 mg via ORAL
  Filled 2015-09-11: qty 2

## 2015-09-11 MED ORDER — INSULIN ASPART PROT & ASPART (70-30 MIX) 100 UNIT/ML ~~LOC~~ SUSP
4.0000 [IU] | Freq: Three times a day (TID) | SUBCUTANEOUS | Status: DC
  Administered 2015-09-12 – 2015-09-13 (×6): 4 [IU] via SUBCUTANEOUS
  Filled 2015-09-11: qty 10

## 2015-09-11 MED ORDER — FLUOXETINE HCL 20 MG PO CAPS
20.0000 mg | ORAL_CAPSULE | Freq: Every day | ORAL | Status: DC
  Administered 2015-09-12 – 2015-09-15 (×4): 20 mg via ORAL
  Filled 2015-09-11 (×4): qty 1

## 2015-09-11 MED ORDER — PIPERACILLIN-TAZOBACTAM 3.375 G IVPB
3.3750 g | Freq: Once | INTRAVENOUS | Status: AC
  Administered 2015-09-11: 3.375 g via INTRAVENOUS
  Filled 2015-09-11: qty 50

## 2015-09-11 MED ORDER — ENOXAPARIN SODIUM 40 MG/0.4ML ~~LOC~~ SOLN
40.0000 mg | SUBCUTANEOUS | Status: DC
  Administered 2015-09-11 – 2015-09-12 (×2): 40 mg via SUBCUTANEOUS
  Filled 2015-09-11 (×2): qty 0.4

## 2015-09-11 MED ORDER — ACETAMINOPHEN 650 MG RE SUPP: 650.0000 mg | Freq: Four times a day (QID) | RECTAL | Status: DC | PRN

## 2015-09-11 MED ORDER — SODIUM CHLORIDE 0.9% FLUSH
3.0000 mL | Freq: Two times a day (BID) | INTRAVENOUS | Status: DC
  Administered 2015-09-11 – 2015-09-15 (×6): 3 mL via INTRAVENOUS

## 2015-09-11 MED ORDER — HYDROCODONE-ACETAMINOPHEN 5-325 MG PO TABS
1.0000 | ORAL_TABLET | ORAL | Status: DC | PRN
  Administered 2015-09-11 – 2015-09-13 (×4): 1 via ORAL
  Filled 2015-09-11 (×6): qty 1

## 2015-09-11 MED ORDER — POLYETHYLENE GLYCOL 3350 17 GM/SCOOP PO POWD: 8.5000 g | Freq: Every day | ORAL | Status: DC | PRN

## 2015-09-11 MED ORDER — MUPIROCIN 2 % EX OINT: 1.0000 "application " | TOPICAL_OINTMENT | Freq: Two times a day (BID) | CUTANEOUS | Status: DC | PRN

## 2015-09-11 MED ORDER — POLYETHYLENE GLYCOL 3350 17 G PO PACK: 8.5000 g | PACK | Freq: Every day | ORAL | Status: DC | PRN

## 2015-09-11 MED ORDER — PIPERACILLIN-TAZOBACTAM 3.375 G IVPB
3.3750 g | Freq: Three times a day (TID) | INTRAVENOUS | Status: DC
  Administered 2015-09-11 – 2015-09-13 (×5): 3.375 g via INTRAVENOUS
  Filled 2015-09-11 (×5): qty 50

## 2015-09-11 MED ORDER — ACYCLOVIR 200 MG PO CAPS
200.0000 mg | ORAL_CAPSULE | Freq: Two times a day (BID) | ORAL | Status: DC
  Administered 2015-09-11 – 2015-09-15 (×8): 200 mg via ORAL
  Filled 2015-09-11 (×8): qty 1

## 2015-09-11 MED ORDER — INSULIN GLARGINE 100 UNIT/ML ~~LOC~~ SOLN
20.0000 [IU] | Freq: Every day | SUBCUTANEOUS | Status: DC
  Administered 2015-09-11 – 2015-09-12 (×2): 20 [IU] via SUBCUTANEOUS
  Filled 2015-09-11 (×3): qty 0.2

## 2015-09-11 MED ORDER — POTASSIUM CHLORIDE IN NACL 20-0.9 MEQ/L-% IV SOLN
INTRAVENOUS | Status: DC
  Administered 2015-09-11 – 2015-09-15 (×7): via INTRAVENOUS

## 2015-09-11 MED ORDER — VANCOMYCIN HCL IN DEXTROSE 1-5 GM/200ML-% IV SOLN
1000.0000 mg | Freq: Once | INTRAVENOUS | Status: AC
  Administered 2015-09-11: 1000 mg via INTRAVENOUS
  Filled 2015-09-11: qty 200

## 2015-09-11 MED ORDER — FILGRASTIM 480 MCG/1.6ML IJ SOLN
480.0000 ug | Freq: Every day | INTRAMUSCULAR | Status: DC
  Administered 2015-09-12 – 2015-09-13 (×2): 480 ug via SUBCUTANEOUS
  Filled 2015-09-11 (×3): qty 1.6

## 2015-09-11 MED ORDER — VANCOMYCIN HCL IN DEXTROSE 1-5 GM/200ML-% IV SOLN
1000.0000 mg | Freq: Two times a day (BID) | INTRAVENOUS | Status: DC
  Administered 2015-09-11 – 2015-09-12 (×3): 1000 mg via INTRAVENOUS
  Filled 2015-09-11 (×3): qty 200

## 2015-09-11 MED ORDER — IBUPROFEN 400 MG PO TABS
400.0000 mg | ORAL_TABLET | Freq: Four times a day (QID) | ORAL | Status: DC | PRN
  Administered 2015-09-12: 400 mg via ORAL
  Filled 2015-09-11: qty 1

## 2015-09-11 MED ORDER — LORAZEPAM 0.5 MG PO TABS
0.5000 mg | ORAL_TABLET | Freq: Three times a day (TID) | ORAL | Status: DC
  Administered 2015-09-11 – 2015-09-15 (×11): 0.5 mg via ORAL
  Filled 2015-09-11 (×11): qty 1

## 2015-09-11 MED ORDER — BISACODYL 5 MG PO TBEC: 5.0000 mg | DELAYED_RELEASE_TABLET | Freq: Every day | ORAL | Status: DC | PRN

## 2015-09-11 MED ORDER — CLOTRIMAZOLE 1 % EX CREA
TOPICAL_CREAM | Freq: Two times a day (BID) | CUTANEOUS | Status: DC
  Filled 2015-09-11: qty 15

## 2015-09-11 MED ORDER — PANTOPRAZOLE SODIUM 40 MG PO TBEC
40.0000 mg | DELAYED_RELEASE_TABLET | Freq: Every day | ORAL | Status: DC
  Administered 2015-09-12 – 2015-09-15 (×4): 40 mg via ORAL
  Filled 2015-09-11 (×4): qty 1

## 2015-09-11 NOTE — ED Notes (Signed)
Patient transported to X-ray 

## 2015-09-11 NOTE — Patient Instructions (Signed)
[place] at [facility] Discharge Instructions  RECOMMENDATIONS MADE BY THE CONSULTANT AND ANY TEST RESULTS WILL BE SENT TO YOUR REFERRING PHYSICIAN.  Neupagen given today.  Follow up as scheduled.  Thank you for choosing [place] at [facility] to provide your oncology and hematology care.  To afford each patient quality time with our provider, please arrive at least 15 minutes before your scheduled appointment time.   Beginning January 23rd 2017 lab work for the [facility] will be done in the  Main lab at [facility] on 1st floor. If you have a lab appointment with the [place] please come in thru the  Main Entrance and check in at the main information desk  You need to re-schedule your appointment should you arrive 10 or more minutes late.  We strive to give you quality time with our providers, and arriving late affects you and other patients whose appointments are after yours.  Also, if you no show three or more times for appointments you may be dismissed from the clinic at the providers discretion.     Again, thank you for choosing [facility].  Our hope is that these requests will decrease the amount of time that you wait before being seen by our physicians.       _____________________________________________________________  Should you have questions after your visit to [facility], please contact our office at [phone] between the hours of 8:30 a.m. and 4:30 p.m.  Voicemails left after 4:30 p.m. will not be returned until the following business day.  For prescription refill requests, have your pharmacy contact our office.         Resources For Cancer Patients and their Caregivers ? American Cancer Society: Can assist with transportation, wigs, general needs, runs Look Good Feel Better.        907-324-5417 ? Cancer Care: Provides financial assistance, online support groups,  medication/co-pay assistance.  1-800-813-HOPE 716-793-3666) ? [place] Assists [place] cancer patients and their families through emotional , educational and financial support.  207-573-0790 ? [place] DSS Where to apply for food stamps, Medicaid and utility assistance. (317) [phone] ? RCATS: Transportation to medical appointments. (314) [phone] ? Social Security Administration: May apply for disability if have a Stage IV cancer. 706-460-5049 541-354-6375 ? [employer], Disability and Transit Services: Assists with nutrition, care and transit needs. [place] Support Programs: @10RELATIVEDAYS @ > Cancer Support Group  2nd Tuesday of the month 1pm-2pm, Journey Room  > Creative Journey  3rd Tuesday of the month 1130am-1pm, Journey Room  > Look Good Feel Better  1st Wednesday of the month 10am-12 noon, Journey Room (Call [place] to register [phone])

## 2015-09-11 NOTE — ED Provider Notes (Signed)
[facility] DEPT Provider Note   CSN: [id number] Arrival date & time: 09/11/15  1307     History   Chief Complaint Chief Complaint  Patient presents with  . Fever    HPI Brittney [name] is a 45 y.o. female.  HPI 45 year old female with insulin-dependent diabetes and breast cancer on chemotherapy presents today with fever. She states that she was seen yesterday in oncology clinic and was supposed to have more chemotherapy but they held it because of her low white blood cell count. She received a Neupogen shot. She noticed some nausea and vomited one time yesterday. She has had some URI type symptoms last weekend. She had some fever and chills yesterday. Today she was seen in oncology clinic and noted to have a temp to 100 and sent to the ED for further evaluation. Past Medical History:  Diagnosis Date  . Breast cancer ([place])   . Breast cancer of upper-outer quadrant of right female breast ([place]) 06/11/2015   Triple negative, 2 cm   . Chemotherapy induced neutropenia ([place]) 09/10/2015  . Diabetes mellitus ([place])    Type 1 over 15 yrs  . Environmental allergies   . GERD (gastroesophageal reflux disease)   . Iron deficiency anemia due to chronic blood loss 06/12/2015  . 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 Chemotherapy induced neutropenia ([place]) 09/10/2015  . Nausea without vomiting 07/09/2015  . Iron deficiency anemia due to chronic blood loss 06/12/2015  . Breast cancer of upper-outer quadrant of right female breast ([place]) 06/11/2015  . Gastroparesis 08/26/2013  . THYROID NODULE, RIGHT 04/17/2008  . COLITIS, ULCERATIVE 11/01/2007  . Type 1 diabetes mellitus ([place]) 10/11/2007  . HYPERLIPIDEMIA 10/11/2007  . ANEMIA-NOS 10/11/2007  . DEPRESSION 10/11/2007  . GERD 10/11/2007  . PEPTIC ULCER DISEASE 10/11/2007  . HEADACHE 10/11/2007  . COLONIC POLYPS, HX OF 10/11/2007  . UTI'S, HX OF 10/11/2007  . CHICKENPOX,  HX OF 10/11/2007    Past Surgical History:  Procedure Laterality Date  . CESAREAN SECTION    . COLONOSCOPY N/A 10/09/2013   Procedure: COLONOSCOPY;  Surgeon: Rogene Houston, MD;  Location: [facility] ENDO SU[facility];  Service: Endoscopy;  Laterality: N/A;  100  . DILATION AND CURETTAGE OF UTERUS     x 2 for miscarriages  . ESOPHAGOGASTRODUODENOSCOPY    . FLEXIBLE SIGMOIDOSCOPY    . FLEXIBLE SIGMOIDOSCOPY  07/27/2011   Procedure: FLEXIBLE SIGMOIDOSCOPY;  Surgeon: Rogene Houston, MD;  Location: [facility] ENDO SUITE;  Service: Endoscopy;  Laterality: N/A;  100    OB History    No data available       Home Medications    Prior to Admission medications   Medication Sig Start Date End Date Taking? Authorizing Provider  acetaminophen (TYLENOL) 500 MG tablet Take 1,000 mg by mouth every 6 (six) hours as needed. For pain   Yes Historical Provider, MD  acyclovir (ZOVIRAX) 200 MG capsule Take 200 mg by mouth 2 (two) times daily. Reported on 07/23/2015   Yes Historical Provider, MD  albuterol (PROVENTIL HFA;VENTOLIN HFA) 108 (90 Base) MCG/ACT inhaler Inhale into the lungs every 6 (six) hours as needed for wheezing or shortness of breath.   Yes Historical Provider, MD  APRISO 0.375 g 24 hr capsule TAKE FOUR (4) CAPSULES BY MOUTH EVERY MORNING 06/26/15  Yes Butch Penny, NP  clotrimazole-betamethasone (LOTRISONE) cream Apply 1 application topically 2 (two) times daily. Apply to  groin 08/27/15  Yes Manon Hilding Kefalas, PA-C  cyclobenzaprine (FLEXERIL) 5 MG tablet Take 1 tablet (5 mg total) by mouth 3 (three) times daily as needed for muscle spasms. 09/10/15  Yes Patrici Ranks, MD  EPINEPHrine (EPIPEN) 0.3 mg/0.3 mL SOAJ injection Inject into the muscle as needed.   Yes Historical Provider, MD  FLUoxetine (PROZAC) 20 MG capsule Take 20 mg by mouth daily.   Yes Historical Provider, MD  gabapentin (NEURONTIN) 100 MG capsule Take 100 mg by mouth 3 (three) times daily.    Yes Historical Provider, MD  ibuprofen  (ADVIL,MOTRIN) 200 MG tablet Take 400 mg by mouth every 6 (six) hours as needed for moderate pain.   Yes Historical Provider, MD  insulin glargine (LANTUS) 100 UNIT/ML injection Inject 20 Units into the skin at bedtime.    Yes Historical Provider, MD  insulin lispro protamine-insulin lispro (HUMALOG 50/50) (50-50) 100 UNIT/ML SUSP Inject 4 Units into the skin 4 (four) times daily. With each meal   Yes Historical Provider, MD  iron polysaccharides (NIFEREX) 150 MG capsule Take 150 mg by mouth daily.   Yes Historical Provider, MD  lidocaine-prilocaine (EMLA) cream Apply a quarter size amount to port site 1 hour prior to chemo. Do not rub in. Cover with plastic wrap. 06/13/15  Yes Patrici Ranks, MD  loratadine (CLARITIN) 10 MG tablet TAKE ONE TABLET BY MOUTH DAILY FOR HIVES. 07/04/14  Yes Historical Provider, MD  LORazepam (ATIVAN) 0.5 MG tablet Take 1 tablet (0.5 mg total) by mouth every 8 (eight) hours. For nausea or anxiety 07/03/15  Yes Patrici Ranks, MD  Melatonin 5 MG TABS Take 5 mg by mouth at bedtime.   Yes Historical Provider, MD  mometasone (NASONEX) 50 MCG/ACT nasal spray Place 2 sprays into the nose daily as needed.    Yes Historical Provider, MD  omeprazole (PRILOSEC) 20 MG capsule Take 20 mg by mouth 2 (two) times daily before a meal.    Yes Historical Provider, MD  ondansetron (ZOFRAN ODT) 8 MG disintegrating tablet Take 1 tablet (8 mg total) by mouth every 8 (eight) hours as needed for nausea or vomiting. 06/13/15  Yes Patrici Ranks, MD  polyethylene glycol powder (GLYCOLAX/MIRALAX) powder Take 8.5 g by mouth daily. Patient taking differently: Take 8.5 g by mouth daily as needed for mild constipation.  09/16/14  Yes Rogene Houston, MD  prochlorperazine (COMPAZINE) 10 MG tablet Take 1 tablet (10 mg total) by mouth every 6 (six) hours as needed for nausea or vomiting. 06/13/15  Yes Patrici Ranks, MD  promethazine (PHENERGAN) 25 MG tablet Take 1 tablet (25 mg total) by mouth every 6  (six) hours as needed for nausea or vomiting. 07/03/15  Yes Patrici Ranks, MD  cephALEXin (KEFLEX) 500 MG capsule Take 1 capsule (500 mg total) by mouth 3 (three) times daily. 09/10/15   Patrici Ranks, MD  HYDROcodone-acetaminophen (NORCO/VICODIN) 5-325 MG tablet Take 1 tablet by mouth every 4 (four) hours as needed for moderate pain. 09/09/15   Baird Cancer, PA-C  mupirocin ointment (BACTROBAN) 2 % Place 1 application into the nose 2 (two) times daily. Patient taking differently: Place 1 application into the nose 2 (two) times daily as needed (irritation).  07/03/15   Patrici Ranks, MD    Family History Family History  Problem Relation Age of Onset  . Colon cancer Cousin     Social History Social History  Substance Use Topics  . Smoking status: Never  Smoker  . Smokeless tobacco: Never Used  . Alcohol use No     Allergies   Ace inhibitors; Adhesive [tape]; Pravastatin sodium; Statins; Peanuts [peanut oil]; and Shellfish allergy   Review of Systems Review of Systems  All other systems reviewed and are negative.    Physical Exam Updated Vital Signs BP 112/56 (BP Location: Right Arm)   Pulse 88   Temp 100 F (37.8 C) (Oral)   Resp 15   Ht 5' 1"  (1.549 m)   Wt 78 kg   LMP 09/04/2015 (Approximate)   SpO2 99%   BMI 32.50 kg/m   Physical Exam  Constitutional: She is oriented to person, place, and time. She appears well-developed and well-nourished. No distress.  HENT:  Head: Normocephalic and atraumatic.  Right Ear: External ear normal.  Left Ear: External ear normal.  Nose: Nose normal.  Mouth/Throat: Oropharynx is clear and moist.  Eyes: Conjunctivae and EOM are normal. Pupils are equal, round, and reactive to light.  Neck: Normal range of motion. Neck supple.  Cardiovascular: Tachycardia present.   Pulmonary/Chest: Effort normal and breath sounds normal.  Abdominal: Soft. Bowel sounds are normal.  Musculoskeletal: Normal range of motion.    Neurological: She is alert and oriented to person, place, and time. She has normal reflexes.  Skin: Capillary refill takes less than 2 seconds.  2 small draining areas right axilla with mild erythema distal to the distal one. She also has a small tender nodule in the left axilla  Psychiatric: She has a normal mood and affect. Her behavior is normal. Judgment and thought content normal.  Nursing note and vitals reviewed.    ED Treatments / Results  Labs (all labs ordered are listed, but only abnormal results are displayed) Labs Reviewed  COMPREHENSIVE METABOLIC PANEL - Abnormal; Notable for the following:       Result Value   Sodium 132 (*)    Potassium 3.4 (*)    Chloride 99 (*)    Glucose, Bld 158 (*)    Calcium 8.4 (*)    All other components within normal limits  URINALYSIS, ROUTINE W REFLEX MICROSCOPIC (NOT AT [facility]) - Abnormal; Notable for the following:    Color, Urine AMBER (*)    Bilirubin Urine SMALL (*)    Ketones, ur >80 (*)    Protein, ur 100 (*)    All other components within normal limits  CBC WITH DIFFERENTIAL/PLATELET - Abnormal; Notable for the following:    WBC 3.4 (*)    RBC 3.50 (*)    Hemoglobin 9.5 (*)    HCT 29.1 (*)    RDW 17.2 (*)    Neutro Abs 0.1 (*)    Monocytes Absolute 2.0 (*)    All other components within normal limits  URINE MICROSCOPIC-ADD ON - Abnormal; Notable for the following:    Squamous Epithelial / LPF 0-5 (*)    Bacteria, UA FEW (*)    All other components within normal limits  CULTURE, BLOOD (ROUTINE X 2)  CULTURE, BLOOD (ROUTINE X 2)  URINE CULTURE  LACTIC ACID, PLASMA  LACTIC ACID, PLASMA    EKG  EKG Interpretation None       Radiology Dg Chest 2 View  Result Date: 09/11/2015 CLINICAL DATA:  Fever and chills for 3 days EXAM: CHEST  2 VIEW COMPARISON:  6/9/ 17 FINDINGS: The heart size and mediastinal contours are within normal limits. Both lungs are clear. Mild degenerative changes thoracic spine. Left subclavian  Port-A-Cath with tip  in SVC. No pneumothorax. IMPRESSION: No active cardiopulmonary disease. Electronically Signed   By: Lahoma Crocker M.D.   On: 09/11/2015 14:02    Procedures Procedures (including critical care time)  Medications Ordered in ED Medications  piperacillin-tazobactam (ZOSYN) IVPB 3.375 g (3.375 g Intravenous New Bag/Given 09/11/15 1457)  vancomycin (VANCOCIN) IVPB 1000 mg/200 mL premix (1,000 mg Intravenous New Bag/Given 09/11/15 1538)  acetaminophen (TYLENOL) tablet 650 mg (650 mg Oral Given 09/11/15 1334)     Initial Impression / Assessment and Plan / ED Course  I have reviewed the triage vital signs and the nursing notes.  Pertinent labs & imaging results that were available during my care of the patient were reviewed by me and considered in my medical decision making (see chart for details).  Clinical Course    Patient had cultures obtained and given Zosyn and vancomycin.  Final Clinical Impressions(s) / ED Diagnoses   Final diagnoses:  Neutropenic fever ([place])    New Prescriptions New Prescriptions   No medications on file     Pattricia Boss, MD 09/11/15 [phone]

## 2015-09-11 NOTE — Progress Notes (Signed)
Pharmacy Antibiotic Note  Brittney [name] is a 45 y.o. female admitted on 09/11/2015 with febrile neutropenia.  Pharmacy has been consulted for Vancomycin and Zosyn dosing.  Initial doses given in ED, pt not fully loaded with Vanc so will start next dose early.  Plan:  Vancomycin 1052m IV q12h Check trough at steady state Zosyn 3.375gm IV q8h, EID Monitor labs, renal fxn, progress and c/s  Height: 5' 1"  (154.9 cm) Weight: 169 lb 9.6 oz (76.9 kg) IBW/kg (Calculated) : 47.8  Temp (24hrs), Avg:100.5 F (38.1 C), Min:100 F (37.8 C), Max:101.3 F (38.5 C)   Recent Labs Lab 09/10/15 1047 09/10/15 1305 09/11/15 1346 09/11/15 1410 09/11/15 1627  WBC 1.6* 1.6* 3.4*  --   --   CREATININE 0.48  --  0.58  --   --   LATICACIDVEN  --   --   --  1.2 1.4    Estimated Creatinine Clearance: 85 mL/min (by C-G formula based on SCr of 0.8 mg/dL).    Allergies  Allergen Reactions  . Ace Inhibitors Itching  . Adhesive [Tape] Rash  . Pravastatin Sodium Itching  . Statins Itching  . Peanuts [Peanut Oil] Itching  . Shellfish Allergy Itching and Rash    Antimicrobials this admission: Vancomycin 9/8 >>  Zosyn 9/8 >>   Dose adjustments this admission:  Recent Results (from the past 240 hour(s))  Aerobic Culture (superficial specimen)     Status: None   Collection Time: 09/03/15 10:30 AM  Result Value Ref Range Status   Specimen Description WOUND RIGHT AXILLA  Final   Special Requests NONE  Final   Gram Stain   Final    NO WBC SEEN NO ORGANISMS SEEN [facility]   Culture   Final    [facility]   Report Status 09/06/2015 FINAL  Final   Organism ID, Bacteria METHICILLIN RESISTANT STAPHYLOCOCCUS AUREUS  Final      Susceptibility   Methicillin resistant staphylococcus aureus - MIC*    CIPROFLOXACIN <=0.5 SENSITIVE Sensitive     ERYTHROMYCIN >=8 RESISTANT Resistant     GENTAMICIN <=0.5  SENSITIVE Sensitive     OXACILLIN >=4 RESISTANT Resistant     TETRACYCLINE <=1 SENSITIVE Sensitive     VANCOMYCIN <=0.5 SENSITIVE Sensitive     TRIMETH/SULFA <=10 SENSITIVE Sensitive     CLINDAMYCIN <=0.25 SENSITIVE Sensitive     RIFAMPIN <=0.5 SENSITIVE Sensitive     Inducible Clindamycin NEGATIVE Sensitive     * FEW METHICILLIN RESISTANT STAPHYLOCOCCUS AUREUS  Culture, blood (Routine X 2)     Status: None (Preliminary result)   Collection Time: 09/11/15  2:10 PM  Result Value Ref Range Status   Specimen Description BLOOD LEFT HAND  Final   Special Requests BOTTLES DRAWN AEROBIC AND ANAEROBIC 6CC  Final   Culture PENDING  Incomplete   Report Status PENDING  Incomplete  Culture, blood (Routine X 2)     Status: None (Preliminary result)   Collection Time: 09/11/15  2:11 PM  Result Value Ref Range Status   Specimen Description BLOOD RIGHT HAND  Final   Special Requests BOTTLES DRAWN AEROBIC AND ANAEROBIC 6CC  Final   Culture PENDING  Incomplete   Report Status PENDING  Incomplete   Thank you for allowing pharmacy to be a part of this patient's care.  HHart RobinsonsA 09/11/2015 8:55 PM

## 2015-09-11 NOTE — ED Notes (Signed)
Attempted report X1

## 2015-09-11 NOTE — H&P (Signed)
History and Physical    Brittney [name] KPV:[id number] DOB: 03/19/1970 DOA: 09/11/2015  PCP: [name], MD  Patient coming from: Home  Chief Complaint: Fever in setting of neutropenia  HPI: Brittney [name] is a 45 y.o. female with medical history significant of breast cancer- undergoing chemotherapy, Type I Diabetes, GERD, UC whom presents from oncology office after being found to be febrile.  Patient reports her last treatment was on Thursday of last week (1 week and 1 day ago).  Since that time she says she just did not feel right.  She reports that she felt weak, and tired.  This progressed to 5 days ago when patient began experiencing significant nausea, foot and leg pain, and chills.  She reports that over the past 4 days she has progressively gotten worse.  She was seen by her surgeon just a few days prior to admission and she was noted to have bullae in both axilla that were opened and beginning to drain.  Patient reports that she was then placed on an antibiotic.  Wounds were cultured and have continued to drain a cloudy yellow fluid.  She voices the pain in her feet began in her feet then went to her legs and finally rose to her hips and pelvis during the past 5 days.  She mentions it was excrutiatingly painful to walk.  Denies numbness and loss of sensation.  Denies dysuria, endorses some constipation. Does mentions her blood sugars have been hard to control since starting chemotherapy as she has been getting steroids at time of chemotherapy. Patient denies any recent travel and voices her only contacts have been her mother, her daughter and her nephew all of which she notes have been diagnosed with seasonal allergies and have been given medication for allergies.  She denied chest pain, chest pressure, shortness of breath, increased work of breathing, abdominal pain.  No confusion, changes in vision.  ED Course: Patient was seen and evaluated by ED physician.  IV access was obtained  and when patient was found to be febrile she was started on Vancomycin and Zosyn.  Blood work was obtained and patient was found to be neutropenic.  Cultures were obtained of the blood, and urine.  Patient was awake, alert and oriented.  Review of Systems: As per HPI otherwise 10 point review of systems negative.    Past Medical History:  Diagnosis Date  . Breast cancer ([place])   . Breast cancer of upper-outer quadrant of right female breast ([place]) 06/11/2015   Triple negative, 2 cm   . Chemotherapy induced neutropenia ([place]) 09/10/2015  . Diabetes mellitus ([place])    Type 1 over 15 yrs  . Environmental allergies   . GERD (gastroesophageal reflux disease)   . Iron deficiency anemia due to chronic blood loss 06/12/2015  . 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 CESAREAN SECTION    . COLONOSCOPY N/A 10/09/2013   Procedure: COLONOSCOPY;  Surgeon: Rogene Houston, MD;  Location: [facility] END[facility];  Service: Endoscopy;  Laterality: N/A;  100  . DILATION AND CURETTAGE OF UTERUS     x 2 for miscarriages  . ESOPHAGOGASTRODUODENOSCOPY    . FLEXIBLE SIGMOIDOSCOPY    . FLEXIBLE SIGMOIDOSCOPY  07/27/2011   Procedure: FLEXIBLE SIGMOIDOSCOPY;  Surgeon: Rogene Houston, MD;  Location: [facility];  Service: Endoscopy;  Laterality: N/A;  100     reports that she has never  smoked. She has never used smokeless tobacco. She reports that she does not drink alcohol or use drugs.  Allergies  Allergen Reactions  . Ace Inhibitors Itching  . Adhesive [Tape] Rash  . Pravastatin Sodium Itching  . Statins Itching  . Peanuts [Peanut Oil] Itching  . Shellfish Allergy Itching and Rash    Family History  Problem Relation Age of Onset  . Non-Hodgkin's lymphoma Mother   . Hypertension Mother   . Diabetes Mellitus II Mother   . Hypertension Father   . Diabetes Mellitus II Father   . Colon cancer Cousin   . Breast cancer Maternal Aunt      Prior  to Admission medications   Medication Sig Start Date End Date Taking? Authorizing Provider  acetaminophen (TYLENOL) 500 MG tablet Take 1,000 mg by mouth every 6 (six) hours as needed. For pain   Yes Historical Provider, MD  acyclovir (ZOVIRAX) 200 MG capsule Take 200 mg by mouth 2 (two) times daily. Reported on 07/23/2015   Yes Historical Provider, MD  albuterol (PROVENTIL HFA;VENTOLIN HFA) 108 (90 Base) MCG/ACT inhaler Inhale into the lungs every 6 (six) hours as needed for wheezing or shortness of breath.   Yes Historical Provider, MD  APRISO 0.375 g 24 hr capsule TAKE FOUR (4) CAPSULES BY MOUTH EVERY MORNING 06/26/15  Yes Butch Penny, NP  clotrimazole-betamethasone (LOTRISONE) cream Apply 1 application topically 2 (two) times daily. Apply to groin 08/27/15  Yes Manon Hilding Kefalas, PA-C  cyclobenzaprine (FLEXERIL) 5 MG tablet Take 1 tablet (5 mg total) by mouth 3 (three) times daily as needed for muscle spasms. 09/10/15  Yes Patrici Ranks, MD  EPINEPHrine (EPIPEN) 0.3 mg/0.3 mL SOAJ injection Inject into the muscle as needed.   Yes Historical Provider, MD  FLUoxetine (PROZAC) 20 MG capsule Take 20 mg by mouth daily.   Yes Historical Provider, MD  gabapentin (NEURONTIN) 100 MG capsule Take 100 mg by mouth 3 (three) times daily.    Yes Historical Provider, MD  ibuprofen (ADVIL,MOTRIN) 200 MG tablet Take 400 mg by mouth every 6 (six) hours as needed for moderate pain.   Yes Historical Provider, MD  insulin glargine (LANTUS) 100 UNIT/ML injection Inject 20 Units into the skin at bedtime.    Yes Historical Provider, MD  insulin lispro protamine-insulin lispro (HUMALOG 50/50) (50-50) 100 UNIT/ML SUSP Inject 4 Units into the skin 4 (four) times daily. With each meal   Yes Historical Provider, MD  iron polysaccharides (NIFEREX) 150 MG capsule Take 150 mg by mouth daily.   Yes Historical Provider, MD  lidocaine-prilocaine (EMLA) cream Apply a quarter size amount to port site 1 hour prior to chemo. Do not rub  in. Cover with plastic wrap. 06/13/15  Yes Patrici Ranks, MD  loratadine (CLARITIN) 10 MG tablet TAKE ONE TABLET BY MOUTH DAILY FOR HIVES. 07/04/14  Yes Historical Provider, MD  LORazepam (ATIVAN) 0.5 MG tablet Take 1 tablet (0.5 mg total) by mouth every 8 (eight) hours. For nausea or anxiety 07/03/15  Yes Patrici Ranks, MD  Melatonin 5 MG TABS Take 5 mg by mouth at bedtime.   Yes Historical Provider, MD  mometasone (NASONEX) 50 MCG/ACT nasal spray Place 2 sprays into the nose daily as needed.    Yes Historical Provider, MD  omeprazole (PRILOSEC) 20 MG capsule Take 20 mg by mouth 2 (two) times daily before a meal.    Yes Historical Provider, MD  ondansetron (ZOFRAN ODT) 8 MG disintegrating tablet Take 1 tablet (  8 mg total) by mouth every 8 (eight) hours as needed for nausea or vomiting. 06/13/15  Yes Patrici Ranks, MD  polyethylene glycol powder (GLYCOLAX/MIRALAX) powder Take 8.5 g by mouth daily. Patient taking differently: Take 8.5 g by mouth daily as needed for mild constipation.  09/16/14  Yes Rogene Houston, MD  prochlorperazine (COMPAZINE) 10 MG tablet Take 1 tablet (10 mg total) by mouth every 6 (six) hours as needed for nausea or vomiting. 06/13/15  Yes Patrici Ranks, MD  promethazine (PHENERGAN) 25 MG tablet Take 1 tablet (25 mg total) by mouth every 6 (six) hours as needed for nausea or vomiting. 07/03/15  Yes Patrici Ranks, MD  cephALEXin (KEFLEX) 500 MG capsule Take 1 capsule (500 mg total) by mouth 3 (three) times daily. 09/10/15   Patrici Ranks, MD  HYDROcodone-acetaminophen (NORCO/VICODIN) 5-325 MG tablet Take 1 tablet by mouth every 4 (four) hours as needed for moderate pain. 09/09/15   Baird Cancer, PA-C  mupirocin ointment (BACTROBAN) 2 % Place 1 application into the nose 2 (two) times daily. Patient taking differently: Place 1 application into the nose 2 (two) times daily as needed (irritation).  07/03/15   Patrici Ranks, MD    Physical Exam: Vitals:    09/11/15 1430 09/11/15 1448 09/11/15 1544 09/11/15 1600  BP: 118/70 118/72 112/56 115/71  Pulse: 92 98 88 86  Resp: 14 17 15 15   Temp:      TempSrc:      SpO2: 95% 97% 99% 99%  Weight:      Height:          Constitutional: NAD, calm, comfortable Vitals:   09/11/15 1430 09/11/15 1448 09/11/15 1544 09/11/15 1600  BP: 118/70 118/72 112/56 115/71  Pulse: 92 98 88 86  Resp: 14 17 15 15   Temp:      TempSrc:      SpO2: 95% 97% 99% 99%  Weight:      Height:       Eyes: PERRL, lids and conjunctivae normal ENMT: Mucous membranes are moist. Posterior pharynx clear of any exudate or lesions.Normal dentition.  Neck: normal, supple, no masses, no thyromegaly Respiratory: clear to auscultation bilaterally, no wheezing, no crackles. Normal respiratory effort. No accessory muscle use.  Cardiovascular: Regular rate and rhythm, no murmurs / rubs / gallops. No extremity edema. 2+ pedal pulses. No carotid bruits.  Abdomen: no tenderness, no masses palpated. No hepatosplenomegaly. Bowel sounds positive.  Musculoskeletal: no clubbing / cyanosis. No joint deformity upper and lower extremities. Good ROM, no contractures. Normal muscle tone.  Skin: 2 <1cm lesions in right axilla draining sanginous fluid, one <1cm bullae in left axila not open or draining Neurologic: CN 2-12 grossly intact. Sensation intact, DTR normal. Strength 5/5 in all 4.  Psychiatric: Normal judgment and insight. Alert and oriented x 3. Normal mood.    Labs on Admission: I have personally reviewed following labs and imaging studies  CBC:  Recent Labs Lab 09/10/15 1047 09/10/15 1305 09/11/15 1346  WBC 1.6* 1.6* 3.4*  NEUTROABS 0.0* 0.0* 0.1*  HGB 9.9* 9.5* 9.5*  HCT 29.9* 29.0* 29.1*  MCV 83.3 83.1 83.1  PLT 245 230 382   Basic Metabolic Panel:  Recent Labs Lab 09/10/15 1047 09/11/15 1346  NA 134* 132*  K 3.5 3.4*  CL 99* 99*  CO2 25 26  GLUCOSE 247* 158*  BUN 8 9  CREATININE 0.48 0.58  CALCIUM 8.7* 8.4*    GFR: Estimated Creatinine Clearance: 85.7  mL/min (by C-G formula based on SCr of 0.8 mg/dL). Liver Function Tests:  Recent Labs Lab 09/10/15 1047 09/11/15 1346  AST 20 17  ALT 18 15  ALKPHOS 70 67  BILITOT 1.0 1.1  PROT 6.7 6.9  ALBUMIN 3.7 3.5   No results for input(s): LIPASE, AMYLASE in the last 168 hours. No results for input(s): AMMONIA in the last 168 hours. Coagulation Profile: No results for input(s): INR, PROTIME in the last 168 hours. Cardiac Enzymes: No results for input(s): CKTOTAL, CKMB, CKMBINDEX, TROPONINI in the last 168 hours. BNP (last 3 results) No results for input(s): PROBNP in the last 8760 hours. HbA1C: No results for input(s): HGBA1C in the last 72 hours. CBG: No results for input(s): GLUCAP in the last 168 hours. Lipid Profile: No results for input(s): CHOL, HDL, LDLCALC, TRIG, CHOLHDL, LDLDIRECT in the last 72 hours. Thyroid Function Tests: No results for input(s): TSH, T4TOTAL, FREET4, T3FREE, THYROIDAB in the last 72 hours. Anemia Panel: No results for input(s): VITAMINB12, FOLATE, FERRITIN, TIBC, IRON, RETICCTPCT in the last 72 hours. Urine analysis:    Component Value Date/Time   COLORURINE AMBER (A) 09/11/2015 1453   APPEARANCEUR CLEAR 09/11/2015 1453   LABSPEC 1.020 09/11/2015 1453   PHURINE 6.0 09/11/2015 1453   GLUCOSEU NEGATIVE 09/11/2015 1453   HGBUR NEGATIVE 09/11/2015 1453   BILIRUBINUR SMALL (A) 09/11/2015 1453   KETONESUR >80 (A) 09/11/2015 1453   PROTEINUR 100 (A) 09/11/2015 1453   NITRITE NEGATIVE 09/11/2015 1453   LEUKOCYTESUR NEGATIVE 09/11/2015 1453   Sepsis Labs: !!!!!!!!!!!!!!!!!!!!!!!!!!!!!!!!!!!!!!!!!!!! @LABRCNTIP (procalcitonin:4,lacticidven:4) ) Recent Results (from the past 240 hour(s))  Aerobic Culture (superficial specimen)     Status: None   Collection Time: 09/03/15 10:30 AM  Result Value Ref Range Status   Specimen Description WOUND RIGHT AXILLA  Final   Special Requests NONE  Final   Gram Stain    Final    NO WBC SEEN NO ORGANISMS SEEN Performed at [facility]    Culture   Final    FEW STREPTOCOCCUS GROUP G FEW METHICILLIN RESISTANT STAPHYLOCOCCUS AUREUS    Report Status 09/06/2015 FINAL  Final   Organism ID, Bacteria METHICILLIN RESISTANT STAPHYLOCOCCUS AUREUS  Final      Susceptibility   Methicillin resistant staphylococcus aureus - MIC*    CIPROFLOXACIN <=0.5 SENSITIVE Sensitive     ERYTHROMYCIN >=8 RESISTANT Resistant     GENTAMICIN <=0.5 SENSITIVE Sensitive     OXACILLIN >=4 RESISTANT Resistant     TETRACYCLINE <=1 SENSITIVE Sensitive     VANCOMYCIN <=0.5 SENSITIVE Sensitive     TRIMETH/SULFA <=10 SENSITIVE Sensitive     CLINDAMYCIN <=0.25 SENSITIVE Sensitive     RIFAMPIN <=0.5 SENSITIVE Sensitive     Inducible Clindamycin NEGATIVE Sensitive     * FEW METHICILLIN RESISTANT STAPHYLOCOCCUS AUREUS  Culture, blood (Routine X 2)     Status: None (Preliminary result)   Collection Time: 09/11/15  2:10 PM  Result Value Ref Range Status   Specimen Description BLOOD LEFT HAND  Final   Special Requests BOTTLES DRAWN AEROBIC AND ANAEROBIC 6CC  Final   Culture PENDING  Incomplete   Report Status PENDING  Incomplete  Culture, blood (Routine X 2)     Status: None (Preliminary result)   Collection Time: 09/11/15  2:11 PM  Result Value Ref Range Status   Specimen Description BLOOD RIGHT HAND  Final   Special Requests BOTTLES DRAWN AEROBIC AND ANAEROBIC 6CC  Final   Culture PENDING  Incomplete   Report Status  PENDING  Incomplete     Radiological Exams on Admission: Dg Chest 2 View  Result Date: 09/11/2015 CLINICAL DATA:  Fever and chills for 3 days EXAM: CHEST  2 VIEW COMPARISON:  6/9/ 17 FINDINGS: The heart size and mediastinal contours are within normal limits. Both lungs are clear. Mild degenerative changes thoracic spine. Left subclavian Port-A-Cath with tip in SVC. No pneumothorax. IMPRESSION: No active cardiopulmonary disease. Electronically Signed   By: Lahoma Crocker  M.D.   On: 09/11/2015 14:02    EKG: not done  Assessment/Plan Principal Problem:   Neutropenic fever (HCC) Active Problems:   Type 1 diabetes mellitus (HCC)   Depression   GERD   Ulcerative colitis ([place])   Breast cancer of upper-outer quadrant of right female breast (HCC)    Neutropenic Fever - Continue broad spectrum antibiotics of Vancomycin and Zosyn - blood and urine cultures pending - monitor closely - will redraw CBCD in am - closely monitor with low threshold to transfer to step down unit if patient begins to become septic  Type 1 Diabetes Mellitus - Continue home dose of Lantus and Humalog which will be changed to 70/30 while in hospital - CBGs ACHS - can add ISS if needed  Depression - continue home medication of prozac  GERD - continue Omeprazole  Ulcerative Colitis - Continue Apriso  Breast cancer - Oncology sent patient in  Open wounds in axilla - cultured on 8/31 - patient on broad spectrum antibiotics but these lesions do not appear infected      DVT prophylaxis: Lovenox and SCD's Code Status: Full  Family Communication: no family present- patient states she will call her family  Disposition Plan: Plan to discharge home when medically stable Consults called: none Admission status: Inpatient  45 minutes Newman Pies MD [facility] Pager [phone]  If 7PM-7AM, please contact night-coverage [email or web address] Password [facility]  09/11/2015, 5:47 PM

## 2015-09-11 NOTE — ED Triage Notes (Signed)
Pt. Sent from cancer center. Pt. Has right breast cancer.   Pt. Came in with complaints of nausea and leg pain  X5 days with back and neck pain X2 days and vomiting X2 days. Pt. Had neupagen 480 mcg this am. Sent for 101.3 temp per cancer center.

## 2015-09-11 NOTE — Progress Notes (Signed)
Patient presented for a injection today. Patient's temperature is 101.3. Patient reported she had vomited yesterday seven times. Reported this to Dr. Whitney Muse. She instructed me to take her to the ER due to Neutropenic fever. Report called to ER Nurse, Lana Fish RN. Took patient in wheelchair to the ER with her mother present.  Brittney [name] presents today for injection per MD orders. Neupogen 446mg administered SQ in left Abdomen. Administration without incident. Patient tolerated well.

## 2015-09-12 ENCOUNTER — Ambulatory Visit (HOSPITAL_COMMUNITY): Payer: Medicaid Other

## 2015-09-12 DIAGNOSIS — C50411 Malignant neoplasm of upper-outer quadrant of right female breast: Secondary | ICD-10-CM

## 2015-09-12 LAB — GLUCOSE, CAPILLARY
GLUCOSE-CAPILLARY: 127 mg/dL — AB (ref 65–99)
GLUCOSE-CAPILLARY: 210 mg/dL — AB (ref 65–99)
GLUCOSE-CAPILLARY: 328 mg/dL — AB (ref 65–99)
GLUCOSE-CAPILLARY: 414 mg/dL — AB (ref 65–99)
Glucose-Capillary: 335 mg/dL — ABNORMAL HIGH (ref 65–99)

## 2015-09-12 LAB — CBC WITH DIFFERENTIAL/PLATELET
BASOS PCT: 1 %
Basophils Absolute: 0.1 10*3/uL (ref 0.0–0.1)
EOS ABS: 0 10*3/uL (ref 0.0–0.7)
Eosinophils Relative: 0 %
HCT: 27.4 % — ABNORMAL LOW (ref 36.0–46.0)
HEMOGLOBIN: 8.9 g/dL — AB (ref 12.0–15.0)
Lymphocytes Relative: 27 %
Lymphs Abs: 1.5 10*3/uL (ref 0.7–4.0)
MCH: 27.2 pg (ref 26.0–34.0)
MCHC: 32.5 g/dL (ref 30.0–36.0)
MCV: 83.8 fL (ref 78.0–100.0)
MONO ABS: 3 10*3/uL — AB (ref 0.1–1.0)
Monocytes Relative: 57 %
NEUTROS ABS: 0.8 10*3/uL — AB (ref 1.7–7.7)
Neutrophils Relative %: 15 %
Platelets: 248 10*3/uL (ref 150–400)
RBC: 3.27 MIL/uL — ABNORMAL LOW (ref 3.87–5.11)
RDW: 16.8 % — AB (ref 11.5–15.5)
WBC: 5.4 10*3/uL (ref 4.0–10.5)

## 2015-09-12 LAB — BASIC METABOLIC PANEL
ANION GAP: 9 (ref 5–15)
BUN: 7 mg/dL (ref 6–20)
CALCIUM: 8.4 mg/dL — AB (ref 8.9–10.3)
CO2: 26 mmol/L (ref 22–32)
Chloride: 98 mmol/L — ABNORMAL LOW (ref 101–111)
Creatinine, Ser: 0.52 mg/dL (ref 0.44–1.00)
GLUCOSE: 158 mg/dL — AB (ref 65–99)
POTASSIUM: 3.2 mmol/L — AB (ref 3.5–5.1)
Sodium: 133 mmol/L — ABNORMAL LOW (ref 135–145)

## 2015-09-12 NOTE — Progress Notes (Signed)
Patient had temp of 102.8 orally documented this afternoon. Rechecked temp was 103.7 orally, pt c/o headache and neck discomfort with nausea. Tylenol and zofran given as ordered PRN. Notified Dr. Jearld Shines. Nursing to recheck temperature and notify MD of result. Donavan Foil, RN

## 2015-09-12 NOTE — Progress Notes (Signed)
Discussed contact precautions and protective precautions with patient this am.  Educated her that visitors have access to gowns and masks as well. Encouraged handwashing before and after leaving room if any visitors come.   Verbalized understanding. Donavan Foil, RN

## 2015-09-12 NOTE — Progress Notes (Signed)
Ibuprofen to be given as well for fever per MD request. Ibuprofen given as ordered. Vital signs stable, see flowsheet. Labs not yet drawn. Notified lab of stat labs ordered. Donavan Foil, RN

## 2015-09-12 NOTE — Progress Notes (Signed)
PROGRESS NOTE    Brittney [name]  FWY:[id number] DOB: [birth year] DOA: 09/11/2015 PCP: [name], MD    Brief Narrative:  Brittney [name] is a 45 y.o. female with medical history significant of breast cancer- undergoing chemotherapy, Type I Diabetes, GERD, UC whom presents from oncology office after being found to be febrile.  Patient reports her last treatment was on Thursday of last week (1 week and 1 day ago).  Since that time she says she just did not feel right.  She reports that she felt weak, and tired.  This progressed to 5 days ago when patient began experiencing significant nausea, foot and leg pain, and chills.  She reports that over the past 4 days she has progressively gotten worse.  She was seen by her surgeon just a few days prior to admission and she was noted to have bullae in both axilla that were opened and beginning to drain.  Patient reports that she was then placed on an antibiotic.  Wounds were cultured and have continued to drain a cloudy yellow fluid.  She voices the pain in her feet began in her feet then went to her legs and finally rose to her hips and pelvis during the past 5 days.  She mentions it was excrutiatingly painful to walk.  Denies numbness and loss of sensation.  Denies dysuria, endorses some constipation. Does mentions her blood sugars have been hard to control since starting chemotherapy as she has been getting steroids at time of chemotherapy. Patient denies any recent travel and voices her only contacts have been her mother, her daughter and her nephew all of which she notes have been diagnosed with seasonal allergies and have been given medication for allergies.  She denied chest pain, chest pressure, shortness of breath, increased work of breathing, abdominal pain.  No confusion, changes in vision.  Started on broad spectrum IV antibiotics secondary to neutropenic fever and [facility] to [facility].   Assessment & Plan:   Principal Problem:  Neutropenic fever (HCC) Active Problems:   Type 1 diabetes mellitus (HCC)   Depression   GERD   Ulcerative colitis ([place])   Breast cancer of upper-outer quadrant of right female breast (HCC)   Neutropenic Fever - Continue broad spectrum antibiotics of Vancomycin and Zosyn - blood and urine cultures pending - monitor closely - will redraw CBCD in am - closely monitor with low threshold to transfer to step down unit if patient begins to become septic - afebrile since admission   Type 1 Diabetes Mellitus - Continue home dose of Lantus and Humalog which will be changed to 70/30 while in hospital - CBGs ACHS - can add ISS if needed - blood sugars have been reasonably controlled- a high of 210 at noon today  Depression - continue home medication of prozac  GERD - continue Omeprazole  Ulcerative Colitis - Continue Apriso  Breast cancer - Oncology sent patient in  Open wounds in axilla - cultured on 8/31- positive for MRSA - patient on broad spectrum antibiotics but these lesions do not appear infected    DVT prophylaxis: Lovenox and SCD's Code Status: Full Family Communication: Patient denies needing me to call her mother- she just spoke to her Disposition Plan: will continue to monitor cultures; discharge in 1-2 days if patient stays medically stable   Consultants:   Oncology consult placed in epic  Procedures:   none  Antimicrobials:   Vancomycin 9/8 =>  Zosyn 9/8=>    Subjective: Patient feeling  well this morning.  Still having some chills but less than prior to admission.  No nausea since admission.  No vomiting, chest pain, chest pressure, increased work of breathing, abdominal pain.  Still having pain in bilateral axilla from the bullae present.  Denies any increased drainage or change in the drainage from these sites.  Asked appropriate questions concerning laboratory and diagnostic studies done.    Objective: Vitals:   09/11/15 1700 09/11/15  1730 09/11/15 1948 09/12/15 0707  BP: 129/73 128/74 126/66 134/79  Pulse: 90 88 98 91  Resp: 16 19 18 18   Temp:   100.1 F (37.8 C) 99.1 F (37.3 C)  TempSrc:   Oral Oral  SpO2: 99% 100% 100% 100%  Weight:   76.9 kg (169 lb 9.6 oz)   Height:   5' 1"  (1.549 m)     Intake/Output Summary (Last 24 hours) at 09/12/15 1325 Last data filed at 09/12/15 1114  Gross per 24 hour  Intake                3 ml  Output                0 ml  Net                3 ml   Filed Weights   09/11/15 1314 09/11/15 1948  Weight: 78 kg (172 lb) 76.9 kg (169 lb 9.6 oz)    Examination:  General exam: Appears calm and comfortable  Respiratory system: Clear to auscultation. Respiratory effort normal. Cardiovascular system: S1 & S2 heard, RRR. No JVD, murmurs, rubs, gallops or clicks. No pedal edema. Gastrointestinal system: Abdomen is nondistended, soft and nontender. No organomegaly or masses felt. Normal bowel sounds heard. Central nervous system: Alert and oriented. No focal neurological deficits. Extremities: Symmetric 5 x 5 power. Skin: No rashes; bandage in right axilla present and shows minimal sanginous drainage.  Bandage in left axilla shows no drainage. Psychiatry: Judgement and insight appear normal. Mood & affect appropriate.     Data Reviewed: I have personally reviewed following labs and imaging studies  CBC:  Recent Labs Lab 09/10/15 1047 09/10/15 1305 09/11/15 1346 09/12/15 0606  WBC 1.6* 1.6* 3.4* 5.4  NEUTROABS 0.0* 0.0* 0.1* 0.8*  HGB 9.9* 9.5* 9.5* 8.9*  HCT 29.9* 29.0* 29.1* 27.4*  MCV 83.3 83.1 83.1 83.8  PLT 245 230 250 545   Basic Metabolic Panel:  Recent Labs Lab 09/10/15 1047 09/11/15 1346 09/12/15 0606  NA 134* 132* 133*  K 3.5 3.4* 3.2*  CL 99* 99* 98*  CO2 25 26 26   GLUCOSE 247* 158* 158*  BUN 8 9 7   CREATININE 0.48 0.58 0.52  CALCIUM 8.7* 8.4* 8.4*   GFR: Estimated Creatinine Clearance: 85 mL/min (by C-G formula based on SCr of 0.8 mg/dL). Liver  Function Tests:  Recent Labs Lab 09/10/15 1047 09/11/15 1346  AST 20 17  ALT 18 15  ALKPHOS 70 67  BILITOT 1.0 1.1  PROT 6.7 6.9  ALBUMIN 3.7 3.5   No results for input(s): LIPASE, AMYLASE in the last 168 hours. No results for input(s): AMMONIA in the last 168 hours. Coagulation Profile: No results for input(s): INR, PROTIME in the last 168 hours. Cardiac Enzymes: No results for input(s): CKTOTAL, CKMB, CKMBINDEX, TROPONINI in the last 168 hours. BNP (last 3 results) No results for input(s): PROBNP in the last 8760 hours. HbA1C: No results for input(s): HGBA1C in the last 72 hours. CBG:  Recent Labs Lab  09/11/15 1959 09/12/15 0801 09/12/15 1231  GLUCAP 175* 127* 210*   Lipid Profile: No results for input(s): CHOL, HDL, LDLCALC, TRIG, CHOLHDL, LDLDIRECT in the last 72 hours. Thyroid Function Tests: No results for input(s): TSH, T4TOTAL, FREET4, T3FREE, THYROIDAB in the last 72 hours. Anemia Panel: No results for input(s): VITAMINB12, FOLATE, FERRITIN, TIBC, IRON, RETICCTPCT in the last 72 hours. Sepsis Labs:  Recent Labs Lab 09/11/15 1410 09/11/15 1627  LATICACIDVEN 1.2 1.4    Recent Results (from the past 240 hour(s))  Aerobic Culture (superficial specimen)     Status: None   Collection Time: 09/03/15 10:30 AM  Result Value Ref Range Status   Specimen Description WOUND RIGHT AXILLA  Final   Special Requests NONE  Final   Gram Stain   Final    NO WBC SEEN NO ORGANISMS SEEN Performed at [facility]    Culture   Final    FEW STREPTOCOCCUS GROUP G FEW METHICILLIN RESISTANT STAPHYLOCOCCUS AUREUS    Report Status 09/06/2015 FINAL  Final   Organism ID, Bacteria METHICILLIN RESISTANT STAPHYLOCOCCUS AUREUS  Final      Susceptibility   Methicillin resistant staphylococcus aureus - MIC*    CIPROFLOXACIN <=0.5 SENSITIVE Sensitive     ERYTHROMYCIN >=8 RESISTANT Resistant     GENTAMICIN <=0.5 SENSITIVE Sensitive     OXACILLIN >=4 RESISTANT Resistant      TETRACYCLINE <=1 SENSITIVE Sensitive     VANCOMYCIN <=0.5 SENSITIVE Sensitive     TRIMETH/SULFA <=10 SENSITIVE Sensitive     CLINDAMYCIN <=0.25 SENSITIVE Sensitive     RIFAMPIN <=0.5 SENSITIVE Sensitive     Inducible Clindamycin NEGATIVE Sensitive     * FEW METHICILLIN RESISTANT STAPHYLOCOCCUS AUREUS  Culture, blood (Routine X 2)     Status: None (Preliminary result)   Collection Time: 09/11/15  2:10 PM  Result Value Ref Range Status   Specimen Description BLOOD LEFT HAND  Final   Special Requests BOTTLES DRAWN AEROBIC AND ANAEROBIC 6CC  Final   Culture NO GROWTH < 24 HOURS  Final   Report Status PENDING  Incomplete  Culture, blood (Routine X 2)     Status: None (Preliminary result)   Collection Time: 09/11/15  2:11 PM  Result Value Ref Range Status   Specimen Description BLOOD RIGHT HAND  Final   Special Requests BOTTLES DRAWN AEROBIC AND ANAEROBIC 6CC  Final   Culture NO GROWTH < 24 HOURS  Final   Report Status PENDING  Incomplete         Radiology Studies: Dg Chest 2 View  Result Date: 09/11/2015 CLINICAL DATA:  Fever and chills for 3 days EXAM: CHEST  2 VIEW COMPARISON:  6/9/ 17 FINDINGS: The heart size and mediastinal contours are within normal limits. Both lungs are clear. Mild degenerative changes thoracic spine. Left subclavian Port-A-Cath with tip in SVC. No pneumothorax. IMPRESSION: No active cardiopulmonary disease. Electronically Signed   By: Lahoma Crocker M.D.   On: 09/11/2015 14:02        Scheduled Meds: . acyclovir  200 mg Oral BID  . clotrimazole   Topical BID  . enoxaparin (LOVENOX) injection  40 mg Subcutaneous Q24H  . filgrastim  480 mcg Subcutaneous Daily  . FLUoxetine  20 mg Oral Daily  . gabapentin  100 mg Oral TID  . insulin aspart protamine- aspart  4 Units Subcutaneous TID WC  . insulin glargine  20 Units Subcutaneous QHS  . LORazepam  0.5 mg Oral Q8H  . pantoprazole  40 mg  Oral Daily  . piperacillin-tazobactam (ZOSYN)  IV  3.375 g Intravenous Q8H   . sodium chloride flush  3 mL Intravenous Q12H  . vancomycin  1,000 mg Intravenous Q12H   Continuous Infusions: . 0.9 % NaCl with KCl 20 mEq / L 50 mL/hr at 09/11/15 2011     LOS: 1 day    Time spent: 30 minutes    [name] Pies, MD [facility] Pager [phone]  If 7PM-7AM, please contact night-coverage [email or web address] Password [email or web address] 09/12/2015, 1:25 PM

## 2015-09-12 NOTE — Progress Notes (Signed)
On reassessment, pt temp 102.7 orally after tylenol as ordered PRN. See MAR. Reported headache and nausea decreased. CBG 328. Pt did not eat any supper. Pt stated preferred not to take insulin 70/30 4 units as ordered since she didn't eat. States insulin drops her blood sugar at home if she does not eat. Text-paged MD to notify. Donavan Foil, RN

## 2015-09-12 NOTE — Progress Notes (Signed)
Ordered labs not yet drawn. Notified lab again of stat lactic acid order. Text-paged MD to notify. Donavan Foil, RN

## 2015-09-13 LAB — COMPREHENSIVE METABOLIC PANEL
ALBUMIN: 2.6 g/dL — AB (ref 3.5–5.0)
ALK PHOS: 73 U/L (ref 38–126)
ALT: 15 U/L (ref 14–54)
ALT: 14 U/L (ref 14–54)
AST: 21 U/L (ref 15–41)
AST: 20 U/L (ref 15–41)
Albumin: 3 g/dL — ABNORMAL LOW (ref 3.5–5.0)
Alkaline Phosphatase: 82 U/L (ref 38–126)
Anion gap: 8 (ref 5–15)
Anion gap: 5 (ref 5–15)
BILIRUBIN TOTAL: 0.6 mg/dL (ref 0.3–1.2)
BILIRUBIN TOTAL: 0.5 mg/dL (ref 0.3–1.2)
BUN: 12 mg/dL (ref 6–20)
BUN: 13 mg/dL (ref 6–20)
CALCIUM: 8.4 mg/dL — AB (ref 8.9–10.3)
CHLORIDE: 98 mmol/L — AB (ref 101–111)
CHLORIDE: 104 mmol/L (ref 101–111)
CO2: 24 mmol/L (ref 22–32)
CO2: 25 mmol/L (ref 22–32)
CREATININE: 1.43 mg/dL — AB (ref 0.44–1.00)
CREATININE: 2.41 mg/dL — AB (ref 0.44–1.00)
Calcium: 8.2 mg/dL — ABNORMAL LOW (ref 8.9–10.3)
GFR calc Af Amer: 27 mL/min — ABNORMAL LOW (ref >60)
GFR, EST AFRICAN AMERICAN: 51 mL/min — AB (ref >60)
GFR, EST NON AFRICAN AMERICAN: 44 mL/min — AB (ref >60)
GFR, EST NON AFRICAN AMERICAN: 23 mL/min — AB (ref >60)
GLUCOSE: 258 mg/dL — AB (ref 65–99)
Glucose, Bld: 334 mg/dL — ABNORMAL HIGH (ref 65–99)
POTASSIUM: 3.9 mmol/L (ref 3.5–5.1)
Potassium: 3.7 mmol/L (ref 3.5–5.1)
Sodium: 130 mmol/L — ABNORMAL LOW (ref 135–145)
Sodium: 134 mmol/L — ABNORMAL LOW (ref 135–145)
TOTAL PROTEIN: 6 g/dL — AB (ref 6.5–8.1)
Total Protein: 5.9 g/dL — ABNORMAL LOW (ref 6.5–8.1)

## 2015-09-13 LAB — GLUCOSE, CAPILLARY
GLUCOSE-CAPILLARY: 249 mg/dL — AB (ref 65–99)
GLUCOSE-CAPILLARY: 86 mg/dL (ref 65–99)
Glucose-Capillary: 149 mg/dL — ABNORMAL HIGH (ref 65–99)
Glucose-Capillary: 36 mg/dL — CL (ref 65–99)
Glucose-Capillary: 67 mg/dL (ref 65–99)

## 2015-09-13 LAB — CBC WITH DIFFERENTIAL/PLATELET
BASOS ABS: 0 10*3/uL (ref 0.0–0.1)
BLASTS: 0 %
Band Neutrophils: 5 %
Basophils Absolute: 0 10*3/uL (ref 0.0–0.1)
Basophils Relative: 0 %
Basophils Relative: 0 %
EOS PCT: 0 %
Eosinophils Absolute: 0 10*3/uL (ref 0.0–0.7)
Eosinophils Absolute: 0 10*3/uL (ref 0.0–0.7)
Eosinophils Relative: 0 %
HEMATOCRIT: 28 % — AB (ref 36.0–46.0)
HEMATOCRIT: 26.9 % — AB (ref 36.0–46.0)
Hemoglobin: 9 g/dL — ABNORMAL LOW (ref 12.0–15.0)
Hemoglobin: 8.7 g/dL — ABNORMAL LOW (ref 12.0–15.0)
LYMPHS ABS: 1.7 10*3/uL (ref 0.7–4.0)
LYMPHS PCT: 16 %
Lymphocytes Relative: 15 %
Lymphs Abs: 2.7 10*3/uL (ref 0.7–4.0)
MCH: 26.5 pg (ref 26.0–34.0)
MCH: 26.8 pg (ref 26.0–34.0)
MCHC: 32.1 g/dL (ref 30.0–36.0)
MCHC: 32.3 g/dL (ref 30.0–36.0)
MCV: 82.4 fL (ref 78.0–100.0)
MCV: 82.8 fL (ref 78.0–100.0)
METAMYELOCYTES PCT: 0 %
MYELOCYTES: 0 %
Monocytes Absolute: 3.3 10*3/uL — ABNORMAL HIGH (ref 0.1–1.0)
Monocytes Absolute: 1.5 10*3/uL — ABNORMAL HIGH (ref 0.1–1.0)
Monocytes Relative: 31 %
Monocytes Relative: 8 %
NEUTROS ABS: 5.5 10*3/uL (ref 1.7–7.7)
NEUTROS PCT: 72 %
NRBC: 0 /100{WBCs}
Neutro Abs: 14 10*3/uL — ABNORMAL HIGH (ref 1.7–7.7)
Neutrophils Relative %: 52 %
Other: 0 %
PLATELETS: 278 10*3/uL (ref 150–400)
PLATELETS: 274 10*3/uL (ref 150–400)
PROMYELOCYTES ABS: 0 %
RBC: 3.4 MIL/uL — AB (ref 3.87–5.11)
RBC: 3.25 MIL/uL — AB (ref 3.87–5.11)
RDW: 17.4 % — ABNORMAL HIGH (ref 11.5–15.5)
RDW: 17.6 % — AB (ref 11.5–15.5)
WBC: 10.5 10*3/uL (ref 4.0–10.5)
WBC: 18.2 10*3/uL — AB (ref 4.0–10.5)

## 2015-09-13 LAB — URINE CULTURE: Culture: 2000 — AB

## 2015-09-13 LAB — VANCOMYCIN, TROUGH: VANCOMYCIN TR: 18 ug/mL (ref 15–20)

## 2015-09-13 LAB — LACTIC ACID, PLASMA
Lactic Acid, Venous: 1.2 mmol/L (ref 0.5–1.9)
Lactic Acid, Venous: 1.5 mmol/L (ref 0.5–1.9)

## 2015-09-13 MED ORDER — SODIUM CHLORIDE 0.9 % IV SOLN
1.0000 g | Freq: Two times a day (BID) | INTRAVENOUS | Status: DC
  Administered 2015-09-13 – 2015-09-14 (×2): 1 g via INTRAVENOUS
  Filled 2015-09-13 (×4): qty 1

## 2015-09-13 MED ORDER — ENOXAPARIN SODIUM 30 MG/0.3ML ~~LOC~~ SOLN
30.0000 mg | SUBCUTANEOUS | Status: DC
  Administered 2015-09-13 – 2015-09-14 (×2): 30 mg via SUBCUTANEOUS
  Filled 2015-09-13 (×2): qty 0.3

## 2015-09-13 MED ORDER — INSULIN ASPART 100 UNIT/ML ~~LOC~~ SOLN
0.0000 [IU] | Freq: Three times a day (TID) | SUBCUTANEOUS | Status: DC
  Administered 2015-09-13: 2 [IU] via SUBCUTANEOUS

## 2015-09-13 NOTE — Progress Notes (Signed)
Pt sweating through gown and linen. Denies any pain or discomfort at this time. Gown and linen changed.  Oral temp 97.4. Will continue to monitor.

## 2015-09-13 NOTE — Progress Notes (Signed)
Pharmacy Antibiotic Note  Brittney [name] is a 45 y.o. female admitted on 09/11/2015 with febrile neutropenia.  Pharmacy has been consulted for MEROPENEM in favor of Vancomycin/Zosyn due to rising SCr.   Plan:  Meropenem 1gm IV q12hrs (renally adjusted) Monitor labs, renal fxn, progress and c/s  Height: 5' 1"  (154.9 cm) Weight: 169 lb 9.6 oz (76.9 kg) IBW/kg (Calculated) : 47.8  Temp (24hrs), Avg:100.7 F (38.2 C), Min:97.4 F (36.3 C), Max:103.7 F (39.8 C)   Recent Labs Lab 09/10/15 1047 09/10/15 1305 09/11/15 1346 09/11/15 1410 09/11/15 1627 09/12/15 0606 09/12/15 2253 09/13/15 0153 09/13/15 1205  WBC 1.6* 1.6* 3.4*  --   --  5.4 10.5  --  18.2*  CREATININE 0.48  --  0.58  --   --  0.52 1.43*  --  2.41*  LATICACIDVEN  --   --   --  1.2 1.4  --  1.2 1.5  --   VANCOTROUGH  --   --   --   --   --   --   --   --  18    Estimated Creatinine Clearance: 28.2 mL/min (by C-G formula based on SCr of 2.41 mg/dL).    Allergies  Allergen Reactions  . Ace Inhibitors Itching  . Adhesive [Tape] Rash  . Pravastatin Sodium Itching  . Statins Itching  . Peanuts [Peanut Oil] Itching  . Shellfish Allergy Itching and Rash   Antimicrobials this admission: Vancomycin 9/8 >> 9/10 Zosyn 9/8 >> 9/10 Meropenem 9/10 >>  Recent Results (from the past 240 hour(s))  Culture, blood (Routine X 2)     Status: None (Preliminary result)   Collection Time: 09/11/15  2:10 PM  Result Value Ref Range Status   Specimen Description BLOOD LEFT HAND  Final   Special Requests BOTTLES DRAWN AEROBIC AND ANAEROBIC 6CC  Final   Culture NO GROWTH 2 DAYS  Final   Report Status PENDING  Incomplete  Culture, blood (Routine X 2)     Status: None (Preliminary result)   Collection Time: 09/11/15  2:11 PM  Result Value Ref Range Status   Specimen Description BLOOD RIGHT HAND  Final   Special Requests BOTTLES DRAWN AEROBIC AND ANAEROBIC 6CC  Final   Culture NO GROWTH 2 DAYS  Final   Report Status PENDING   Incomplete  Urine culture     Status: Abnormal   Collection Time: 09/11/15  2:53 PM  Result Value Ref Range Status   Specimen Description URINE, RANDOM  Final   Special Requests NONE  Final   Culture (A)  Final    2,000 COLONIES/mL INSIGNIFICANT GROWTH Performed at [facility]    Report Status 09/13/2015 FINAL  Final   Thank you for allowing pharmacy to be a part of this patient's care.  Hart Robinsons A 09/13/2015 1:40 PM

## 2015-09-13 NOTE — Progress Notes (Signed)
PROGRESS NOTE    Brittney [name]  RAQ:[id number] DOB: Sep 10, 1970 DOA: 09/11/2015 PCP: [name], MD    Brief Narrative:  Brittney [name] is a 45 y.o. female with medical history significant of breast cancer- undergoing chemotherapy, Type I Diabetes, GERD, UC whom presents from oncology office after being found to be febrile.  Patient reports her last treatment was on Thursday of last week (1 week and 1 day ago).  Since that time she says she just did not feel right.  She reports that she felt weak, and tired.  This progressed to 5 days ago when patient began experiencing significant nausea, foot and leg pain, and chills.  She reports that over the past 4 days she has progressively gotten worse.  She was seen by her surgeon just a few days prior to admission and she was noted to have bullae in both axilla that were opened and beginning to drain.  Patient reports that she was then placed on an antibiotic.  Wounds were cultured and have continued to drain a cloudy yellow fluid.  She voices the pain in her feet began in her feet then went to her legs and finally rose to her hips and pelvis during the past 5 days.  She mentions it was excrutiatingly painful to walk.  Denies numbness and loss of sensation.  Denies dysuria, endorses some constipation. Does mentions her blood sugars have been hard to control since starting chemotherapy as she has been getting steroids at time of chemotherapy. Patient denies any recent travel and voices her only contacts have been her mother, her daughter and her nephew all of which she notes have been diagnosed with seasonal allergies and have been given medication for allergies.  She denied chest pain, chest pressure, shortness of breath, increased work of breathing, abdominal pain.  No confusion, changes in vision.  Started on broad spectrum IV antibiotics secondary to neutropenic fever and admitted to [facility].   Assessment & Plan:   Principal Problem:  Neutropenic fever (HCC) Active Problems:   Type 1 diabetes mellitus (HCC)   Depression   GERD   Ulcerative colitis ([place])   Breast cancer of upper-outer quadrant of right female breast (HCC)   Neutropenic Fever - Continue broad spectrum antibiotics of Vancomycin and Zosyn (need to watch creatinine as it doubled with vancomycin may need to reconsider carbapenem) - blood and urine cultures pending- NGTD - monitor closely - will redraw CBCD in am - closely monitor with low threshold to transfer to step down unit if patient begins to become septic - patient fevering yesterday afternoon - given tylenol - lactic acid redrawn and was 1.5   Type 1 Diabetes Mellitus - Continue home dose of Lantus and Humalog which will be changed to 70/30 while in hospital - CBGs ACHS - patient refused last dose of insulin yesterday secondary to not eating dinner - moderate ISS   Depression - continue home medication of prozac  GERD - continue Omeprazole  Ulcerative Colitis - Continue Apriso  Breast cancer - Oncology sent patient in - oncology consult placed - will d/c Neupogen as patient ANC >1000  Open wounds in axilla - cultured on 8/31- positive for MRSA - patient on broad spectrum antibiotics but these lesions do not appear infected    DVT prophylaxis: Lovenox and SCD's Code Status: Full Family Communication: patient states she will call her mother to discuss treatment plan Disposition Plan: unclear disposition at this time as patient fevering yesterday. Will continue to  monitor closely.  May need to change antibiotics if patient creatinine continues to rise.  Consultants:   Oncology consult placed in epic  Procedures:   none  Antimicrobials:   Vancomycin 9/8 =>  Zosyn 9/8=>    Subjective:  Patient had fever yesterday afternoon.  She voices she had significant chills and sweats at that time but since then denies any chills or sweats.  Also reports a headache  yesterday afternoon and neck pain; both resolved.  She is asking when she could possibly go home.  Discussed with patient that she will not be discharging today as she had a fever less than 24 hours ago.  She voiced understanding.  Denies chest pain, chest pressure, shortness of breath, increased work of breathing, abdominal pain.  Endorses some nausea but reports she does not feel like she need medication for the nausea at this time.  Port was unable to be accessed this morning by laboratory so am labs still pending.  Objective: Vitals:   09/12/15 1710 09/12/15 1806 09/12/15 2131 09/13/15 0545  BP:  127/66 112/66 124/66  Pulse:  (!) 104 (!) 102 100  Resp:  18 18 18   Temp: (!) 102.7 F (39.3 C) 99.5 F (37.5 C) 97.4 F (36.3 C) 97.8 F (36.6 C)  TempSrc: Oral Oral Oral Oral  SpO2:  100% 100% 99%  Weight:      Height:        Intake/Output Summary (Last 24 hours) at 09/13/15 1039 Last data filed at 09/13/15 0634  Gross per 24 hour  Intake             2238 ml  Output                0 ml  Net             2238 ml   Filed Weights   09/11/15 1314 09/11/15 1948  Weight: 78 kg (172 lb) 76.9 kg (169 lb 9.6 oz)    Examination:  General exam: Appears calm and comfortable  Respiratory system: Clear to auscultation. Respiratory effort normal. Cardiovascular system: S1 & S2 heard, RRR. No JVD, murmurs, rubs, gallops or clicks. No pedal edema. Gastrointestinal system: Abdomen is nondistended, soft and nontender. No organomegaly or masses felt. Normal bowel sounds heard. Central nervous system: Alert and oriented. No focal neurological deficits. Extremities: Symmetric 5 x 5 power. Skin: No rashes; bandage in right axilla present and shows sanginous drainage.  Bandage in left axilla shows no drainage. Less tender this morning Psychiatry: Judgement and insight appear normal. Mood & affect appropriate.     Data Reviewed: I have personally reviewed following labs and imaging  studies  CBC:  Recent Labs Lab 09/10/15 1047 09/10/15 1305 09/11/15 1346 09/12/15 0606 09/12/15 2253  WBC 1.6* 1.6* 3.4* 5.4 10.5  NEUTROABS 0.0* 0.0* 0.1* 0.8* 5.5  HGB 9.9* 9.5* 9.5* 8.9* 9.0*  HCT 29.9* 29.0* 29.1* 27.4* 28.0*  MCV 83.3 83.1 83.1 83.8 82.4  PLT 245 230 250 248 009   Basic Metabolic Panel:  Recent Labs Lab 09/10/15 1047 09/11/15 1346 09/12/15 0606 09/12/15 2253  NA 134* 132* 133* 130*  K 3.5 3.4* 3.2* 3.7  CL 99* 99* 98* 98*  CO2 25 26 26 24   GLUCOSE 247* 158* 158* 334*  BUN 8 9 7 12   CREATININE 0.48 0.58 0.52 1.43*  CALCIUM 8.7* 8.4* 8.4* 8.4*   GFR: Estimated Creatinine Clearance: 47.6 mL/min (by C-G formula based on SCr of 1.43 mg/dL). Liver Function  Tests:  Recent Labs Lab 09/10/15 1047 09/11/15 1346 09/12/15 2253  AST 20 17 21   ALT 18 15 15   ALKPHOS 70 67 73  BILITOT 1.0 1.1 0.6  PROT 6.7 6.9 6.0*  ALBUMIN 3.7 3.5 3.0*   No results for input(s): LIPASE, AMYLASE in the last 168 hours. No results for input(s): AMMONIA in the last 168 hours. Coagulation Profile: No results for input(s): INR, PROTIME in the last 168 hours. Cardiac Enzymes: No results for input(s): CKTOTAL, CKMB, CKMBINDEX, TROPONINI in the last 168 hours. BNP (last 3 results) No results for input(s): PROBNP in the last 8760 hours. HbA1C: No results for input(s): HGBA1C in the last 72 hours. CBG:  Recent Labs Lab 09/12/15 1231 09/12/15 1700 09/12/15 1844 09/12/15 2100 09/13/15 0743  GLUCAP 210* 328* 414* 335* 249*   Lipid Profile: No results for input(s): CHOL, HDL, LDLCALC, TRIG, CHOLHDL, LDLDIRECT in the last 72 hours. Thyroid Function Tests: No results for input(s): TSH, T4TOTAL, FREET4, T3FREE, THYROIDAB in the last 72 hours. Anemia Panel: No results for input(s): VITAMINB12, FOLATE, FERRITIN, TIBC, IRON, RETICCTPCT in the last 72 hours. Sepsis Labs:  Recent Labs Lab 09/11/15 1410 09/11/15 1627 09/12/15 2253 09/13/15 0153  LATICACIDVEN 1.2  1.4 1.2 1.5    Recent Results (from the past 240 hour(s))  Culture, blood (Routine X 2)     Status: None (Preliminary result)   Collection Time: 09/11/15  2:10 PM  Result Value Ref Range Status   Specimen Description BLOOD LEFT HAND  Final   Special Requests BOTTLES DRAWN AEROBIC AND ANAEROBIC 6CC  Final   Culture NO GROWTH < 24 HOURS  Final   Report Status PENDING  Incomplete  Culture, blood (Routine X 2)     Status: None (Preliminary result)   Collection Time: 09/11/15  2:11 PM  Result Value Ref Range Status   Specimen Description BLOOD RIGHT HAND  Final   Special Requests BOTTLES DRAWN AEROBIC AND ANAEROBIC 6CC  Final   Culture NO GROWTH < 24 HOURS  Final   Report Status PENDING  Incomplete  Urine culture     Status: Abnormal   Collection Time: 09/11/15  2:53 PM  Result Value Ref Range Status   Specimen Description URINE, RANDOM  Final   Special Requests NONE  Final   Culture (A)  Final    2,000 COLONIES/mL INSIGNIFICANT GROWTH Performed at [facility]    Report Status 09/13/2015 FINAL  Final         Radiology Studies: Dg Chest 2 View  Result Date: 09/11/2015 CLINICAL DATA:  Fever and chills for 3 days EXAM: CHEST  2 VIEW COMPARISON:  6/9/ 17 FINDINGS: The heart size and mediastinal contours are within normal limits. Both lungs are clear. Mild degenerative changes thoracic spine. Left subclavian Port-A-Cath with tip in SVC. No pneumothorax. IMPRESSION: No active cardiopulmonary disease. Electronically Signed   By: Lahoma Crocker M.D.   On: 09/11/2015 14:02        Scheduled Meds: . acyclovir  200 mg Oral BID  . clotrimazole   Topical BID  . enoxaparin (LOVENOX) injection  40 mg Subcutaneous Q24H  . filgrastim  480 mcg Subcutaneous Daily  . FLUoxetine  20 mg Oral Daily  . gabapentin  100 mg Oral TID  . insulin aspart protamine- aspart  4 Units Subcutaneous TID WC  . insulin glargine  20 Units Subcutaneous QHS  . LORazepam  0.5 mg Oral Q8H  . pantoprazole  40  mg Oral Daily  .  piperacillin-tazobactam (ZOSYN)  IV  3.375 g Intravenous Q8H  . sodium chloride flush  3 mL Intravenous Q12H  . vancomycin  1,000 mg Intravenous Q12H   Continuous Infusions: . 0.9 % NaCl with KCl 20 mEq / L 100 mL/hr at 09/13/15 0634     LOS: 2 days    Time spent: 30 minutes    Newman Pies, MD [facility] Pager [phone]  If 7PM-7AM, please contact night-coverage [email or web address] Password [email or web address] 09/13/2015, 10:39 AM

## 2015-09-14 ENCOUNTER — Ambulatory Visit (HOSPITAL_COMMUNITY): Payer: Medicaid Other

## 2015-09-14 DIAGNOSIS — N179 Acute kidney failure, unspecified: Secondary | ICD-10-CM

## 2015-09-14 LAB — BASIC METABOLIC PANEL
ANION GAP: 7 (ref 5–15)
ANION GAP: 7 (ref 5–15)
BUN: 13 mg/dL (ref 6–20)
BUN: 15 mg/dL (ref 6–20)
CALCIUM: 8.6 mg/dL — AB (ref 8.9–10.3)
CO2: 25 mmol/L (ref 22–32)
CO2: 23 mmol/L (ref 22–32)
Calcium: 8.6 mg/dL — ABNORMAL LOW (ref 8.9–10.3)
Chloride: 106 mmol/L (ref 101–111)
Chloride: 108 mmol/L (ref 101–111)
Creatinine, Ser: 3.12 mg/dL — ABNORMAL HIGH (ref 0.44–1.00)
Creatinine, Ser: 3.15 mg/dL — ABNORMAL HIGH (ref 0.44–1.00)
GFR calc Af Amer: 20 mL/min — ABNORMAL LOW (ref >60)
GFR calc non Af Amer: 17 mL/min — ABNORMAL LOW (ref >60)
GFR, EST AFRICAN AMERICAN: 20 mL/min — AB (ref >60)
GFR, EST NON AFRICAN AMERICAN: 17 mL/min — AB (ref >60)
GLUCOSE: 241 mg/dL — AB (ref 65–99)
Glucose, Bld: 194 mg/dL — ABNORMAL HIGH (ref 65–99)
POTASSIUM: 3.8 mmol/L (ref 3.5–5.1)
POTASSIUM: 4.2 mmol/L (ref 3.5–5.1)
SODIUM: 138 mmol/L (ref 135–145)
Sodium: 138 mmol/L (ref 135–145)

## 2015-09-14 LAB — GLUCOSE, CAPILLARY
GLUCOSE-CAPILLARY: 175 mg/dL — AB (ref 65–99)
GLUCOSE-CAPILLARY: 265 mg/dL — AB (ref 65–99)
GLUCOSE-CAPILLARY: 238 mg/dL — AB (ref 65–99)
Glucose-Capillary: 212 mg/dL — ABNORMAL HIGH (ref 65–99)

## 2015-09-14 MED ORDER — DOXYCYCLINE HYCLATE 100 MG PO TABS
100.0000 mg | ORAL_TABLET | Freq: Two times a day (BID) | ORAL | Status: DC
  Administered 2015-09-14 – 2015-09-15 (×2): 100 mg via ORAL
  Filled 2015-09-14 (×2): qty 1

## 2015-09-14 MED ORDER — INSULIN ASPART 100 UNIT/ML ~~LOC~~ SOLN
3.0000 [IU] | Freq: Three times a day (TID) | SUBCUTANEOUS | Status: DC
  Administered 2015-09-14 – 2015-09-15 (×4): 3 [IU] via SUBCUTANEOUS

## 2015-09-14 MED ORDER — INSULIN GLARGINE 100 UNIT/ML ~~LOC~~ SOLN
22.0000 [IU] | Freq: Every day | SUBCUTANEOUS | Status: DC
  Administered 2015-09-14: 22 [IU] via SUBCUTANEOUS
  Filled 2015-09-14 (×2): qty 0.22

## 2015-09-14 MED ORDER — INSULIN ASPART 100 UNIT/ML ~~LOC~~ SOLN
0.0000 [IU] | Freq: Three times a day (TID) | SUBCUTANEOUS | Status: DC
  Administered 2015-09-14 (×2): 3 [IU] via SUBCUTANEOUS
  Administered 2015-09-15: 2 [IU] via SUBCUTANEOUS
  Administered 2015-09-15: 3 [IU] via SUBCUTANEOUS

## 2015-09-14 MED ORDER — INSULIN ASPART 100 UNIT/ML ~~LOC~~ SOLN
0.0000 [IU] | Freq: Every day | SUBCUTANEOUS | Status: DC
  Administered 2015-09-14: 2 [IU] via SUBCUTANEOUS

## 2015-09-14 NOTE — Progress Notes (Signed)
Inpatient Diabetes Program Recommendations  AACE/ADA: New Consensus Statement on Inpatient Glycemic Control (2015)  Target Ranges:  Prepandial:   less than 140 mg/dL      Peak postprandial:   less than 180 mg/dL (1-2 hours)      Critically ill patients:  140 - 180 mg/dL   Results for Brittney [name], Brittney [name] (MRN [id number]) as of 09/14/2015 08:23  Ref. Range 09/13/2015 07:43 09/13/2015 12:02 09/13/2015 16:32 09/13/2015 21:36 09/13/2015 22:00 09/14/2015 08:02  Glucose-Capillary Latest Ref Range: 65 - 99 mg/dL 249 (H) 149 (H) 86 36 (LL) 67 175 (H)   Review of Glycemic Control  Diabetes history: DM1 Outpatient Diabetes medications: Lantus 20 units QHS, Humalog 50/50 4 units QID with meals Current orders for Inpatient glycemic control: Lantus 20 units QHS, 70/30 4 unit TID with meals, Novolog 0-15 units TID with meals  Inpatient Diabetes Program Recommendations: Insulin - Basal: Please consider increasing Lantus to 22 units QHS. Correction (SSI): If patient has Type 1 diabetes she will be sensitive to insulin. Please consider decreasing Novolog correction to sensitive scale and ordering Novolog 0-5 units QHS. Insulin - Meal Coverage: Please discontinue 70/30 4 units TID wiht meals and consider ordering Novolog 3 units TID with meals for meal coverage if patient eats at least 50% of meal.  Thanks, Barnie Alderman, RN, MSN, CDE Diabetes Coordinator Inpatient Diabetes Program [phone] (Team Pager from [place] to [place]) [phone] ([facility] office) [phone] [facility] office) [phone] [facility] office)

## 2015-09-14 NOTE — Clinical Social Work Note (Signed)
CSW received consult, with no stated reason. Discussed with MD who reports no known CSW needs at this time. Will sign off.   Benay Pike, [place]

## 2015-09-14 NOTE — Care Management Note (Signed)
Case Management Note  Patient Details  Name: EDITA WEYENBERG MRN: [id number] Date of Birth: 1970/03/30  Subjective/Objective:    Patient is from home, adm with neutropenic fever. She is currently undergoing chemo for Breast cancer. She is ind. With ADL's. She has a PCP and insurance, reports no issues. She does not feel home health is something she needs at this time.                 Action/Plan: Anticipate DC home with self care. No CM needs known at this time.   Expected Discharge Date:       09/15/2015           Expected Discharge Plan:  Home/Self Care  In-House Referral:  NA  Discharge planning Services  CM Consult  Post Acute Care Choice:  NA Choice offered to:  NA  DME Arranged:    DME Agency:     HH Arranged:    HH Agency:     Status of Service:  Completed, signed off  If discussed at [employer] of Stay Meetings, dates discussed:    Additional Comments:  Brooks Kinnan, Chauncey Reading, RN 09/14/2015, 11:05 AM

## 2015-09-14 NOTE — Progress Notes (Signed)
PROGRESS NOTE    Brittney [name]  GHW:[id number] DOB: 22-Mar-1970 DOA: 09/11/2015 PCP: [name], MD    Brief Narrative:  Brittney [name] is a 45 y.o. female with medical history significant of breast cancer- undergoing chemotherapy, Type I Diabetes, GERD, UC whom presents from oncology office after being found to be febrile.  Patient reports her last treatment was on Thursday of last week (1 week and 1 day ago).  Since that time she says she just did not feel right.  She reports that she felt weak, and tired.  This progressed to 5 days ago when patient began experiencing significant nausea, foot and leg pain, and chills.  She reports that over the past 4 days she has progressively gotten worse.  She was seen by her surgeon just a few days prior to admission and she was noted to have bullae in both axilla that were opened and beginning to drain.  Patient reports that she was then placed on an antibiotic.  Wounds were cultured and have continued to drain a cloudy yellow fluid.  She voices the pain in her feet began in her feet then went to her legs and finally rose to her hips and pelvis during the past 5 days.  She mentions it was excrutiatingly painful to walk.  Denies numbness and loss of sensation.  Denies dysuria, endorses some constipation. Does mentions her blood sugars have been hard to control since starting chemotherapy as she has been getting steroids at time of chemotherapy. Patient denies any recent travel and voices her only contacts have been her mother, her daughter and her nephew all of which she notes have been diagnosed with seasonal allergies and have been given medication for allergies.  She denied chest pain, chest pressure, shortness of breath, increased work of breathing, abdominal pain.  No confusion, changes in vision.  Started on broad spectrum IV antibiotics secondary to neutropenic fever and admitted to [facility].   Assessment & Plan:   Principal Problem:  Neutropenic fever (HCC) Active Problems:   Type 1 diabetes mellitus (HCC)   Depression   GERD   Ulcerative colitis ([place])   Breast cancer of upper-outer quadrant of right female breast (HCC)   Neutropenic Fever - Continue broad spectrum antibiotics but can be transitioned to PO today - blood and urine cultures pending- NGTD - monitor closely - will redraw CBCD in am - closely monitor with low threshold to transfer to step down unit if patient begins to become septic - patient fevering yesterday afternoon - given tylenol - lactic acid redrawn and was 1.5  AKI - Creatinine of 3.12 today - will increase IVF to 176m/hr - redraw BMP in pm and again tomorrow am - discussed with Dr. BLowanda Fosterwho encourages good IVF hydration and serial BMP to ensure that patient Cr has leveled off and started to decrease prior to discharge - will order I's and O's to ensure patient is making enough urine   Type 1 Diabetes Mellitus - d/c 70/30 insulin - CBGs ACHS - sensitive ISS - novolog 3 units with meals   Depression - continue home medication of prozac  GERD - continue Omeprazole  Ulcerative Colitis - Continue Apriso  Breast cancer - Oncology sent patient in - oncology consult placed - will d/c Neupogen as patient AEudora>1000  Open wounds in axilla - cultured on 8/31- positive for MRSA - patient on broad spectrum antibiotics but these lesions do not appear infected    DVT prophylaxis: Lovenox  and SCD's Code Status: Full Family Communication: no family bedside Disposition Plan: can transition to PO antibiotics today; will redraw BMP this afternoon and in am- waiting to see if creatinine levels off and starts to decrease   Consultants:   Oncology consult placed in epic  Procedures:   none  Antimicrobials:   Vancomycin 9/8 => 9/10  Zosyn 9/8=> 9/10  Meropenem 9/10=>9/11  Subjective: Patient is wondering about discharge today.  Discussed with her the increase in  her kidney function tests and that I would like to discuss with the nephrologist. Few chills overnight but no fevers documented.  WBC elevated today.  Patient voices minimal nausea and no vomiting.  No diarrhea or dysuria.  No chest pain or chest pressure. No shortness of breath.  A slight bit of confusion today with low blood sugar that ws corrected with juice.  Patient reports feeling great now.  Objective: Vitals:   09/13/15 0545 09/13/15 1416 09/13/15 2201 09/14/15 0612  BP: 124/66 127/80 (!) 143/74 130/78  Pulse: 100 81 99 87  Resp: 18 20 20 20   Temp: 97.8 F (36.6 C) 99.3 F (37.4 C)  98.4 F (36.9 C)  TempSrc: Oral Oral Oral Oral  SpO2: 99% 100% 100% 100%  Weight:      Height:        Intake/Output Summary (Last 24 hours) at 09/14/15 1045 Last data filed at 09/14/15 0900  Gross per 24 hour  Intake             2860 ml  Output                0 ml  Net             2860 ml   Filed Weights   09/11/15 1314 09/11/15 1948  Weight: 78 kg (172 lb) 76.9 kg (169 lb 9.6 oz)    Examination:  General exam: Appears calm and comfortable  Respiratory system: Clear to auscultation. Respiratory effort normal. Cardiovascular system: S1 & S2 heard, RRR. No JVD, murmurs, rubs, gallops or clicks. No pedal edema. Gastrointestinal system: Abdomen is nondistended, soft and nontender. No organomegaly or masses felt. Normal bowel sounds heard. Central nervous system: Alert and oriented. No focal neurological deficits. Extremities: Symmetric 5 x 5 power. Skin: No rashes; no bandage in right axilla today and ulcers appear stable.  Lesion in left axilla still not draining but tender with patient movement. Psychiatry: Judgement and insight appear normal. Mood & affect appropriate.     Data Reviewed: I have personally reviewed following labs and imaging studies  CBC:  Recent Labs Lab 09/10/15 1305 09/11/15 1346 09/12/15 0606 09/12/15 2253 09/13/15 1205  WBC 1.6* 3.4* 5.4 10.5 18.2*    NEUTROABS 0.0* 0.1* 0.8* 5.5 14.0*  HGB 9.5* 9.5* 8.9* 9.0* 8.7*  HCT 29.0* 29.1* 27.4* 28.0* 26.9*  MCV 83.1 83.1 83.8 82.4 82.8  PLT 230 250 248 278 701   Basic Metabolic Panel:  Recent Labs Lab 09/11/15 1346 09/12/15 0606 09/12/15 2253 09/13/15 1205 09/14/15 0616  NA 132* 133* 130* 134* 138  K 3.4* 3.2* 3.7 3.9 3.8  CL 99* 98* 98* 104 106  CO2 26 26 24 25 25   GLUCOSE 158* 158* 334* 258* 194*  BUN 9 7 12 13 13   CREATININE 7.79 0.52 1.43* 2.41* 3.12*  CALCIUM 8.4* 8.4* 8.4* 8.2* 8.6*   GFR: Estimated Creatinine Clearance: 21.8 mL/min (by C-G formula based on SCr of 3.12 mg/dL). Liver Function Tests:  Recent Labs Lab  09/10/15 1047 09/11/15 1346 09/12/15 2253 09/13/15 1205  AST 20 17 21 20   ALT 18 15 15 14   ALKPHOS 70 67 73 82  BILITOT 1.0 1.1 0.6 0.5  PROT 6.7 6.9 6.0* 5.9*  ALBUMIN 3.7 3.5 3.0* 2.6*   No results for input(s): LIPASE, AMYLASE in the last 168 hours. No results for input(s): AMMONIA in the last 168 hours. Coagulation Profile: No results for input(s): INR, PROTIME in the last 168 hours. Cardiac Enzymes: No results for input(s): CKTOTAL, CKMB, CKMBINDEX, TROPONINI in the last 168 hours. BNP (last 3 results) No results for input(s): PROBNP in the last 8760 hours. HbA1C: No results for input(s): HGBA1C in the last 72 hours. CBG:  Recent Labs Lab 09/13/15 1202 09/13/15 1632 09/13/15 2136 09/13/15 2200 09/14/15 0802  GLUCAP 149* 86 36* 67 175*   Lipid Profile: No results for input(s): CHOL, HDL, LDLCALC, TRIG, CHOLHDL, LDLDIRECT in the last 72 hours. Thyroid Function Tests: No results for input(s): TSH, T4TOTAL, FREET4, T3FREE, THYROIDAB in the last 72 hours. Anemia Panel: No results for input(s): VITAMINB12, FOLATE, FERRITIN, TIBC, IRON, RETICCTPCT in the last 72 hours. Sepsis Labs:  Recent Labs Lab 09/11/15 1410 09/11/15 1627 09/12/15 2253 09/13/15 0153  LATICACIDVEN 1.2 1.4 1.2 1.5    Recent Results (from the past 240  hour(s))  Culture, blood (Routine X 2)     Status: None (Preliminary result)   Collection Time: 09/11/15  2:10 PM  Result Value Ref Range Status   Specimen Description BLOOD LEFT HAND  Final   Special Requests BOTTLES DRAWN AEROBIC AND ANAEROBIC 6CC  Final   Culture NO GROWTH 3 DAYS  Final   Report Status PENDING  Incomplete  Culture, blood (Routine X 2)     Status: None (Preliminary result)   Collection Time: 09/11/15  2:11 PM  Result Value Ref Range Status   Specimen Description BLOOD RIGHT HAND  Final   Special Requests BOTTLES DRAWN AEROBIC AND ANAEROBIC 6CC  Final   Culture NO GROWTH 3 DAYS  Final   Report Status PENDING  Incomplete  Urine culture     Status: Abnormal   Collection Time: 09/11/15  2:53 PM  Result Value Ref Range Status   Specimen Description URINE, RANDOM  Final   Special Requests NONE  Final   Culture (A)  Final    2,000 COLONIES/mL INSIGNIFICANT GROWTH Performed at [facility]    Report Status 09/13/2015 FINAL  Final         Radiology Studies: No results found.      Scheduled Meds: . acyclovir  200 mg Oral BID  . clotrimazole   Topical BID  . enoxaparin (LOVENOX) injection  30 mg Subcutaneous Q24H  . FLUoxetine  20 mg Oral Daily  . gabapentin  100 mg Oral TID  . insulin aspart  0-5 Units Subcutaneous QHS  . insulin aspart  0-9 Units Subcutaneous TID WC  . insulin aspart  3 Units Subcutaneous TID WC  . insulin glargine  22 Units Subcutaneous QHS  . LORazepam  0.5 mg Oral Q8H  . meropenem (MERREM) IV  1 g Intravenous Q12H  . pantoprazole  40 mg Oral Daily  . sodium chloride flush  3 mL Intravenous Q12H   Continuous Infusions: . 0.9 % NaCl with KCl 20 mEq / L 100 mL/hr at 09/14/15 0300     LOS: 3 days    Time spent: 30 minutes    Newman Pies, MD [facility] Pager [phone]  If  7PM-7AM, please contact night-coverage [email or web address] Password [email or web address] 09/14/2015, 10:45 AM

## 2015-09-14 NOTE — Progress Notes (Signed)
Patient's blood sugar 36, Orange juice given PO. Blood sugar up to 67. Patient given Sprite and Graham crackers.

## 2015-09-15 ENCOUNTER — Ambulatory Visit (HOSPITAL_COMMUNITY): Payer: Medicaid Other

## 2015-09-15 ENCOUNTER — Other Ambulatory Visit (HOSPITAL_COMMUNITY): Payer: Self-pay | Admitting: Oncology

## 2015-09-15 LAB — BASIC METABOLIC PANEL
Anion gap: 8 (ref 5–15)
Anion gap: 8 (ref 5–15)
BUN: 15 mg/dL (ref 6–20)
BUN: 14 mg/dL (ref 6–20)
CO2: 21 mmol/L — ABNORMAL LOW (ref 22–32)
CO2: 21 mmol/L — ABNORMAL LOW (ref 22–32)
CREATININE: 2.82 mg/dL — AB (ref 0.44–1.00)
CREATININE: 2.55 mg/dL — AB (ref 0.44–1.00)
Calcium: 8.7 mg/dL — ABNORMAL LOW (ref 8.9–10.3)
Calcium: 8.8 mg/dL — ABNORMAL LOW (ref 8.9–10.3)
Chloride: 108 mmol/L (ref 101–111)
Chloride: 109 mmol/L (ref 101–111)
GFR calc Af Amer: 22 mL/min — ABNORMAL LOW (ref >60)
GFR calc Af Amer: 25 mL/min — ABNORMAL LOW (ref >60)
GFR, EST NON AFRICAN AMERICAN: 19 mL/min — AB (ref >60)
GFR, EST NON AFRICAN AMERICAN: 22 mL/min — AB (ref >60)
Glucose, Bld: 227 mg/dL — ABNORMAL HIGH (ref 65–99)
Glucose, Bld: 187 mg/dL — ABNORMAL HIGH (ref 65–99)
POTASSIUM: 4.4 mmol/L (ref 3.5–5.1)
POTASSIUM: 4.1 mmol/L (ref 3.5–5.1)
SODIUM: 137 mmol/L (ref 135–145)
SODIUM: 138 mmol/L (ref 135–145)

## 2015-09-15 LAB — GLUCOSE, CAPILLARY
GLUCOSE-CAPILLARY: 218 mg/dL — AB (ref 65–99)
Glucose-Capillary: 153 mg/dL — ABNORMAL HIGH (ref 65–99)
Glucose-Capillary: 168 mg/dL — ABNORMAL HIGH (ref 65–99)

## 2015-09-15 MED ORDER — DOXYCYCLINE HYCLATE 100 MG PO TABS: 100.0000 mg | ORAL_TABLET | Freq: Two times a day (BID) | ORAL | 0 refills | Status: DC

## 2015-09-15 MED ORDER — DOXYCYCLINE HYCLATE 100 MG PO TABS: 100.0000 mg | ORAL_TABLET | Freq: Two times a day (BID) | ORAL | 0 refills | Status: AC

## 2015-09-15 NOTE — Discharge Summary (Signed)
Physician Discharge Summary  Brittney [name] UXL:[id number] DOB: 1970-02-22 DOA: 09/11/2015  PCP: [name], MD  Admit date: 09/11/2015 Discharge date: 09/15/2015  Admitted From: Home Disposition:  Home  Recommendations for Outpatient Follow-up:  1. Follow up with PCP in 1-2 weeks 2. Please obtain BMP/CBCD in three days  Home Health:No Equipment/Devices:None  Discharge Condition:Stable CODE STATUS:Full Diet recommendation: Carb Modified  Brief/Interim Summary: 45 y.o.femalewith medical history significant of breast cancer- undergoing chemotherapy, Type I Diabetes, GERD, UC whom presents from oncology office after being found to be febrile. Patient reports her last treatment was on Thursday of last week (1 week and 1 day ago). Since that time she says she just did not feel right. She reports that she felt weak, and tired. This progressed to 5 days ago when patient began experiencing significant nausea, foot and leg pain, and chills. She reports that over the past 4 days she has progressively gotten worse. She was seen by her surgeon just a few days prior to admission and she was noted to have bullae in both axilla that were opened and beginning to drain. Patient reports that she was then placed on an antibiotic. Wounds were cultured and have continued to drain a cloudy yellow fluid. She voices the pain in her feet began in her feet then went to her legs and finally rose to her hips and pelvis during the past 5 days. She mentions it was excrutiatingly painful to walk. Denies numbness and loss of sensation. Denies dysuria, endorses some constipation. Does mentions her blood sugars have been hard to control since starting chemotherapy as she has been getting steroids at time of chemotherapy. Patient denies any recent travel and voices her only contacts have been her mother, her daughter and her nephew all of which she notes have been diagnosed with seasonal allergies and have  been given medication for allergies. She denied chest pain, chest pressure, shortness of breath, increased work of breathing, abdominal pain. No confusion, changes in vision.  Started on broad spectrum IV antibiotics secondary to neutropenic fever and [facility].  Over course of IV antibiotics patient's creatinine increased and vancomycin and zosyn were discontinued and meropenum was initiated.  After being afebrile for >24hours on meropenum patient was transitioned to doxycycline.  Her creatinine plateaued and ultimately started declining at time of discharge.  She was encouraged to get a repeat BMP in 3 days prior to the next weekend.  Discharge Diagnoses:  Principal Problem:   Neutropenic fever ([place]) Active Problems:   Type 1 diabetes mellitus (HCC)   Depression   GERD   Ulcerative colitis ([place])   Breast cancer of upper-outer quadrant of right female breast [facility])    Discharge Instructions  Discharge Instructions    Call MD for:  persistant nausea and vomiting    Complete by:  As directed   Call MD for:  redness, tenderness, or signs of infection (pain, swelling, redness, odor or green/yellow discharge around incision site)    Complete by:  As directed   Call MD for:  severe uncontrolled pain    Complete by:  As directed   Diet - low sodium heart healthy    Complete by:  As directed   Discharge instructions    Complete by:  As directed   Follow up with metabolite panel in 3 days   Increase activity slowly    Complete by:  As directed       Medication List    STOP  taking these medications   cephALEXin 500 MG capsule Commonly known as:  KEFLEX   prochlorperazine 10 MG tablet Commonly known as:  COMPAZINE     TAKE these medications   acetaminophen 500 MG tablet Commonly known as:  TYLENOL Take 1,000 mg by mouth every 6 (six) hours as needed. For pain   acyclovir 200 MG capsule Commonly known as:  ZOVIRAX Take 200 mg by mouth 2 (two) times daily. Reported on  07/23/2015   albuterol 108 (90 Base) MCG/ACT inhaler Commonly known as:  PROVENTIL HFA;VENTOLIN HFA Inhale into the lungs every 6 (six) hours as needed for wheezing or shortness of breath.   APRISO 0.375 g 24 hr capsule Generic drug:  mesalamine TAKE FOUR (4) CAPSULES BY MOUTH EVERY MORNING   clotrimazole-betamethasone cream Commonly known as:  LOTRISONE Apply 1 application topically 2 (two) times daily. Apply to groin   cyclobenzaprine 5 MG tablet Commonly known as:  FLEXERIL Take 1 tablet (5 mg total) by mouth 3 (three) times daily as needed for muscle spasms.   doxycycline 100 MG tablet Commonly known as:  VIBRA-TABS Take 1 tablet (100 mg total) by mouth 2 (two) times daily.   EPIPEN 0.3 mg/0.3 mL Soaj injection Generic drug:  EPINEPHrine Inject into the muscle as needed.   FLUoxetine 20 MG capsule Commonly known as:  PROZAC Take 20 mg by mouth daily.   gabapentin 100 MG capsule Commonly known as:  NEURONTIN Take 100 mg by mouth 3 (three) times daily.   HYDROcodone-acetaminophen 5-325 MG tablet Commonly known as:  NORCO/VICODIN Take 1 tablet by mouth every 4 (four) hours as needed for moderate pain.   ibuprofen 200 MG tablet Commonly known as:  ADVIL,MOTRIN Take 400 mg by mouth every 6 (six) hours as needed for moderate pain.   insulin glargine 100 UNIT/ML injection Commonly known as:  LANTUS Inject 20 Units into the skin at bedtime.   insulin lispro protamine-lispro (50-50) 100 UNIT/ML Susp injection Commonly known as:  HUMALOG 50/50 MIX Inject 4 Units into the skin 4 (four) times daily. With each meal   iron polysaccharides 150 MG capsule Commonly known as:  NIFEREX Take 150 mg by mouth daily.   lidocaine-prilocaine cream Commonly known as:  EMLA Apply a quarter size amount to port site 1 hour prior to chemo. Do not rub in. Cover with plastic wrap.   loratadine 10 MG tablet Commonly known as:  CLARITIN TAKE ONE TABLET BY MOUTH DAILY FOR HIVES.    LORazepam 0.5 MG tablet Commonly known as:  ATIVAN Take 1 tablet (0.5 mg total) by mouth every 8 (eight) hours. For nausea or anxiety   Melatonin 5 MG Tabs Take 5 mg by mouth at bedtime.   mupirocin ointment 2 % Commonly known as:  BACTROBAN Place 1 application into the nose 2 (two) times daily. What changed:  when to take this  reasons to take this   NASONEX 50 MCG/ACT nasal spray Generic drug:  mometasone Place 2 sprays into the nose daily as needed.   omeprazole 20 MG capsule Commonly known as:  PRILOSEC Take 20 mg by mouth 2 (two) times daily before a meal.   ondansetron 8 MG disintegrating tablet Commonly known as:  ZOFRAN ODT Take 1 tablet (8 mg total) by mouth every 8 (eight) hours as needed for nausea or vomiting.   polyethylene glycol powder powder Commonly known as:  GLYCOLAX/MIRALAX Take 8.5 g by mouth daily. What changed:  when to take this  reasons to take  this   promethazine 25 MG tablet Commonly known as:  PHENERGAN Take 1 tablet (25 mg total) by mouth every 6 (six) hours as needed for nausea or vomiting.      Follow-up Information    DANIEL, TERRY, MD Follow up in 1 week(s).   Specialty:  Family Medicine Contact information: [address] [place]  [address] [phone]          Allergies  Allergen Reactions  . Ace Inhibitors Itching  . Adhesive [Tape] Rash  . Pravastatin Sodium Itching  . Statins Itching  . Peanuts [Peanut Oil] Itching  . Shellfish Allergy Itching and Rash    Consultations:  Oncology   Procedures/Studies: Dg Chest 2 View  Result Date: 09/11/2015 CLINICAL DATA:  Fever and chills for 3 days EXAM: CHEST  2 VIEW COMPARISON:  6/9/ 17 FINDINGS: The heart size and mediastinal contours are within normal limits. Both lungs are clear. Mild degenerative changes thoracic spine. Left subclavian Port-A-Cath with tip in SVC. No pneumothorax. IMPRESSION: No active cardiopulmonary disease. Electronically Signed   By: Lahoma Crocker  M.D.   On: 09/11/2015 14:02       Subjective: Patient is very anxious to go home today.  She voices that she feels significantly better than she did previously.  No fevers or chills, no nausea. Eating well.  Denies any pain.    Discharge Exam: Vitals:   09/14/15 2031 09/15/15 0515  BP: (!) 141/85 (!) 147/87  Pulse: 87 93  Resp:    Temp: 98.2 F (36.8 C) 98.6 F (37 C)   Vitals:   09/14/15 0612 09/14/15 1432 09/14/15 2031 09/15/15 0515  BP: 130/78 122/66 (!) 141/85 (!) 147/87  Pulse: 87 87 87 93  Resp: 20 20    Temp: 98.4 F (36.9 C) 99 F (37.2 C) 98.2 F (36.8 C) 98.6 F (37 C)  TempSrc: Oral Oral Oral Oral  SpO2: 100% 98% 100% 99%  Weight:      Height:        General: Pt is alert, awake, not in acute distress Cardiovascular: RRR, S1/S2 +, no rubs, no gallops Respiratory: CTA bilaterally, no wheezing, no rhonchi Abdominal: Soft, NT, ND, bowel sounds + Extremities: no edema, no cyanosis    The results of significant diagnostics from this hospitalization (including imaging, microbiology, ancillary and laboratory) are listed below for reference.     Microbiology: Recent Results (from the past 240 hour(s))  Culture, blood (Routine X 2)     Status: None (Preliminary result)   Collection Time: 09/11/15  2:10 PM  Result Value Ref Range Status   Specimen Description BLOOD LEFT HAND  Final   Special Requests BOTTLES DRAWN AEROBIC AND ANAEROBIC 6CC  Final   Culture NO GROWTH 4 DAYS  Final   Report Status PENDING  Incomplete  Culture, blood (Routine X 2)     Status: None (Preliminary result)   Collection Time: 09/11/15  2:11 PM  Result Value Ref Range Status   Specimen Description BLOOD RIGHT HAND  Final   Special Requests BOTTLES DRAWN AEROBIC AND ANAEROBIC 6CC  Final   Culture NO GROWTH 4 DAYS  Final   Report Status PENDING  Incomplete  Urine culture     Status: Abnormal   Collection Time: 09/11/15  2:53 PM  Result Value Ref Range Status   Specimen Description  URINE, RANDOM  Final   Special Requests NONE  Final   Culture (A)  Final    2,000 COLONIES/mL INSIGNIFICANT GROWTH Performed  at [facility]    Report Status 09/13/2015 FINAL  Final     Labs: BNP (last 3 results) No results for input(s): BNP in the last 8760 hours. Basic Metabolic Panel:  Recent Labs Lab 09/13/15 1205 09/14/15 0616 09/14/15 1432 09/15/15 0554 09/15/15 1300  NA 134* 138 138 137 138  K 3.9 3.8 4.2 4.4 4.1  CL 104 106 108 108 109  CO2 25 25 23  21* 21*  GLUCOSE 258* 194* 241* 227* 187*  BUN 13 13 15 15 14   CREATININE 2.41* 3.12* 3.15* 2.82* 2.55*  CALCIUM 8.2* 8.6* 8.6* 8.7* 8.8*   Liver Function Tests:  Recent Labs Lab 09/10/15 1047 09/11/15 1346 09/12/15 2253 09/13/15 1205  AST 20 17 21 20   ALT 18 15 15 14   ALKPHOS 70 67 73 82  BILITOT 1.0 1.1 0.6 0.5  PROT 6.7 6.9 6.0* 5.9*  ALBUMIN 3.7 3.5 3.0* 2.6*   No results for input(s): LIPASE, AMYLASE in the last 168 hours. No results for input(s): AMMONIA in the last 168 hours. CBC:  Recent Labs Lab 09/10/15 1305 09/11/15 1346 09/12/15 0606 09/12/15 2253 09/13/15 1205  WBC 1.6* 3.4* 5.4 10.5 18.2*  NEUTROABS 0.0* 0.1* 0.8* 5.5 14.0*  HGB 9.5* 9.5* 8.9* 9.0* 8.7*  HCT 29.0* 29.1* 27.4* 28.0* 26.9*  MCV 83.1 83.1 83.8 82.4 82.8  PLT 230 250 248 278 274   Cardiac Enzymes: No results for input(s): CKTOTAL, CKMB, CKMBINDEX, TROPONINI in the last 168 hours. BNP: Invalid input(s): POCBNP CBG:  Recent Labs Lab 09/14/15 1645 09/14/15 2030 09/15/15 0746 09/15/15 1119 09/15/15 1634  GLUCAP 212* 238* 218* 153* 168*   D-Dimer No results for input(s): DDIMER in the last 72 hours. Hgb A1c No results for input(s): HGBA1C in the last 72 hours. Lipid Profile No results for input(s): CHOL, HDL, LDLCALC, TRIG, CHOLHDL, LDLDIRECT in the last 72 hours. Thyroid function studies No results for input(s): TSH, T4TOTAL, T3FREE, THYROIDAB in the last 72 hours.  Invalid input(s): FREET3 Anemia  work up No results for input(s): VITAMINB12, FOLATE, FERRITIN, TIBC, IRON, RETICCTPCT in the last 72 hours. Urinalysis    Component Value Date/Time   COLORURINE AMBER (A) 09/11/2015 1453   APPEARANCEUR CLEAR 09/11/2015 1453   LABSPEC 1.020 09/11/2015 1453   PHURINE 6.0 09/11/2015 1453   GLUCOSEU NEGATIVE 09/11/2015 1453   HGBUR NEGATIVE 09/11/2015 1453   BILIRUBINUR SMALL (A) 09/11/2015 1453   KETONESUR >80 (A) 09/11/2015 1453   PROTEINUR 100 (A) 09/11/2015 1453   NITRITE NEGATIVE 09/11/2015 1453   LEUKOCYTESUR NEGATIVE 09/11/2015 1453   Sepsis Labs Invalid input(s): PROCALCITONIN,  WBC,  LACTICIDVEN Microbiology Recent Results (from the past 240 hour(s))  Culture, blood (Routine X 2)     Status: None (Preliminary result)   Collection Time: 09/11/15  2:10 PM  Result Value Ref Range Status   Specimen Description BLOOD LEFT HAND  Final   Special Requests BOTTLES DRAWN AEROBIC AND ANAEROBIC 6CC  Final   Culture NO GROWTH 4 DAYS  Final   Report Status PENDING  Incomplete  Culture, blood (Routine X 2)     Status: None (Preliminary result)   Collection Time: 09/11/15  2:11 PM  Result Value Ref Range Status   Specimen Description BLOOD RIGHT HAND  Final   Special Requests BOTTLES DRAWN AEROBIC AND ANAEROBIC 6CC  Final   Culture NO GROWTH 4 DAYS  Final   Report Status PENDING  Incomplete  Urine culture     Status: Abnormal   Collection  Time: 09/11/15  2:53 PM  Result Value Ref Range Status   Specimen Description URINE, RANDOM  Final   Special Requests NONE  Final   Culture (A)  Final    2,000 COLONIES/mL INSIGNIFICANT GROWTH Performed at [facility]    Report Status 09/13/2015 FINAL  Final     Time coordinating discharge: Over 30 minutes  SIGNED:   Newman Pies, MD  [facility] 09/15/2015, 4:40 PM Pager [phone] If 7PM-7AM, please contact night-coverage [email or web address] Password [email or web address]

## 2015-09-15 NOTE — Progress Notes (Addendum)
Inpatient Diabetes Program Recommendations  AACE/ADA: New Consensus Statement on Inpatient Glycemic Control (2015)  Target Ranges:  Prepandial:   less than 140 mg/dL      Peak postprandial:   less than 180 mg/dL (1-2 hours)      Critically ill patients:  140 - 180 mg/dL   Lab Results  Component Value Date   GLUCAP 218 (H) 09/15/2015   HGBA1C 7.4 (H) 02/27/2009    Review of Glycemic Control  Results for Brittney [name], Brittney [name] (MRN [id number]) as of 09/15/2015 09:05  Ref. Range 09/14/2015 08:02 09/14/2015 11:12 09/14/2015 16:45 09/14/2015 20:30 09/15/2015 07:46  Glucose-Capillary Latest Ref Range: 65 - 99 mg/dL 175 (H) 265 (H) 212 (H) 238 (H) 218 (H)   Diabetes history: DM1 Outpatient Diabetes medications: Lantus 20 units QHS, Humalog 50/50 4 units QID with meals Current orders for Inpatient glycemic control: Lantus 22 units QHS, Novolog 3 units tid, Novolog 0-9 units TID with meals, Novolog 0-5 units qhs  Inpatient Diabetes Program Recommendations: Please consider increasing Lantus to 27 units QHS ( 0.35units/kg)- fasting blood sugar remains elevated Please consider increasing Novolog to 4 units tid- post prandial blood sugars remain elevated.   Gentry Fitz, RN, BA, MHA, CDE Diabetes Coordinator Inpatient Diabetes Program  [phone] (Team Pager) [phone] ([place]) 09/15/2015 9:11 AM    .

## 2015-09-15 NOTE — Progress Notes (Signed)
Patient with orders to be discharge home. Discharge instructions given, patient verbalized understanding. Patient stable. Patient left in private vehicle with family.

## 2015-09-15 NOTE — Progress Notes (Signed)
Patient is seen socially today.  She is advised to not worry about her missed treatment.    We will cancel her Thursday chemotherapy.  We will work her into the clinic schedule on 9/19 at 2 PM for follow-up appointment and development of furture medical oncology plan.  No charge  Doy Mince 09/15/2015 1:18 PM

## 2015-09-16 ENCOUNTER — Encounter (HOSPITAL_COMMUNITY): Payer: Self-pay

## 2015-09-16 ENCOUNTER — Encounter (HOSPITAL_BASED_OUTPATIENT_CLINIC_OR_DEPARTMENT_OTHER): Payer: Medicaid Other

## 2015-09-16 VITALS — BP 138/85 | HR 79 | Temp 98.9°F | Resp 16

## 2015-09-16 DIAGNOSIS — T451X5A Adverse effect of antineoplastic and immunosuppressive drugs, initial encounter: Principal | ICD-10-CM

## 2015-09-16 DIAGNOSIS — D701 Agranulocytosis secondary to cancer chemotherapy: Secondary | ICD-10-CM

## 2015-09-16 DIAGNOSIS — C50411 Malignant neoplasm of upper-outer quadrant of right female breast: Secondary | ICD-10-CM

## 2015-09-16 LAB — CULTURE, BLOOD (ROUTINE X 2)
CULTURE: NO GROWTH
CULTURE: NO GROWTH

## 2015-09-16 MED ORDER — FILGRASTIM 480 MCG/0.8ML IJ SOSY
480.0000 ug | PREFILLED_SYRINGE | Freq: Once | INTRAMUSCULAR | Status: AC
  Administered 2015-09-16: 480 ug via SUBCUTANEOUS
  Filled 2015-09-16: qty 0.8

## 2015-09-16 NOTE — Progress Notes (Signed)
Pt here today for Neupogen injection. Pt given injection in right lower abdomen. Pt tolerated well. Pt stable and discharged home ambulatory. Pt to return as scheduled.

## 2015-09-16 NOTE — Patient Instructions (Signed)
[place] at [facility] Discharge Instructions  RECOMMENDATIONS MADE BY THE CONSULTANT AND ANY TEST RESULTS WILL BE SENT TO YOUR REFERRING PHYSICIAN.  You were given your Neupogen injection today.  Return as scheduled.    Thank you for choosing [place] at [facility] to provide your oncology and hematology care.  To afford each patient quality time with our provider, please arrive at least 15 minutes before your scheduled appointment time.   Beginning January 23rd 2017 lab work for the [facility] will be done in the  Main lab at [facility] on 1st floor. If you have a lab appointment with the [place] please come in thru the  Main Entrance and check in at the main information desk  You need to re-schedule your appointment should you arrive 10 or more minutes late.  We strive to give you quality time with our providers, and arriving late affects you and other patients whose appointments are after yours.  Also, if you no show three or more times for appointments you may be dismissed from the clinic at the providers discretion.     Again, thank you for choosing [facility].  Our hope is that these requests will decrease the amount of time that you wait before being seen by our physicians.       _____________________________________________________________  Should you have questions after your visit to [facility], please contact our office at [phone] between the hours of 8:30 a.m. and 4:30 p.m.  Voicemails left after 4:30 p.m. will not be returned until the following business day.  For prescription refill requests, have your pharmacy contact our office.         Resources For Cancer Patients and their Caregivers ? American Cancer Society: Can assist with transportation, wigs, general needs, runs Look Good Feel Better.        518-637-6530 ? Cancer Care: Provides financial assistance, online support  groups, medication/co-pay assistance.  1-800-813-HOPE 216 291 0230) ? [place] Assists [place] cancer patients and their families through emotional , educational and financial support.  208 124 7070 ? [place] DSS Where to apply for food stamps, Medicaid and utility assistance. (229)408-6435 ? [facility]: Transportation to medical appointments. 437-242-9627 ? Social Security Administration: May apply for disability if have a Stage IV cancer. (225) 696-8244 617-607-8680 ? [employer], Disability and Transit Services: Assists with nutrition, care and transit needs. [place] Support Programs: @10RELATIVEDAYS @ > Cancer Support Group  2nd Tuesday of the month 1pm-2pm, Journey Room  > Creative Journey  3rd Tuesday of the month 1130am-1pm, Journey Room  > Look Good Feel Better  1st Wednesday of the month 10am-12 noon, Journey Room (Call [place] to register [phone])

## 2015-09-17 ENCOUNTER — Ambulatory Visit (INDEPENDENT_AMBULATORY_CARE_PROVIDER_SITE_OTHER): Payer: Medicaid Other | Admitting: Internal Medicine

## 2015-09-17 ENCOUNTER — Ambulatory Visit (HOSPITAL_COMMUNITY): Payer: Medicaid Other

## 2015-09-17 ENCOUNTER — Encounter (INDEPENDENT_AMBULATORY_CARE_PROVIDER_SITE_OTHER): Payer: Self-pay | Admitting: Internal Medicine

## 2015-09-17 VITALS — BP 152/90 | HR 80 | Temp 99.0°F | Ht 61.0 in | Wt 186.4 lb

## 2015-09-17 DIAGNOSIS — K5120 Ulcerative (chronic) proctitis without complications: Secondary | ICD-10-CM

## 2015-09-17 NOTE — Patient Instructions (Signed)
Sedrate. OV in 6 months.

## 2015-09-17 NOTE — Progress Notes (Signed)
Subjective:    Patient ID: Brittney [name], female    DOB: 11-29-1970, 45 y.o.   MRN: [id number]  HPIHere today for f/u of UC. Last seen in March of 2017 by me. Flexible sigmoid in July 2013 revealed active disease to distal sigmoid colon and rectum.    rectum.   She had colonoscopy in October 2015 revealing rectal and patchy sigmoid colon disease.  [facility] with a low WBC ct and fever. Admitted x 4 days. Hx of Breast Cancer Diagnosed in April of this year with breast cancer. Underwent a lumpectomy by Dr. Anthony Sar and is receiving chemo thru [facility].  Maintained on Apriso 4 tabs a day for her UC.  She is having about 1-3 stools a day. She saw a small amt of blood in her stool this morning. Stools are soft. No diarrhea. Her appetite is not good since starting the chemo this round. No melena.  Has not been taking iron due to the cost.    CBC Latest Ref Rng & Units 09/13/2015 09/12/2015 09/12/2015  WBC 4.0 - 10.5 K/uL 18.2(H) 10.5 5.4  Hemoglobin 12.0 - 15.0 g/dL 8.7(L) 9.0(L) 8.9(L)  Hematocrit 36.0 - 46.0 % 26.9(L) 28.0(L) 27.4(L)  Platelets 150 - 400 K/uL 274 278 248    CBC    Component Value Date/Time   WBC 18.2 (H) 09/13/2015 1205   RBC 3.25 (L) 09/13/2015 1205   HGB 8.7 (L) 09/13/2015 1205   HCT 26.9 (L) 09/13/2015 1205   PLT 274 09/13/2015 1205   MCV 82.8 09/13/2015 1205   MCH 26.8 09/13/2015 1205   MCHC 32.3 09/13/2015 1205   RDW 17.6 (H) 09/13/2015 1205   LYMPHSABS 2.7 09/13/2015 1205   MONOABS 1.5 (H) 09/13/2015 1205   EOSABS 0.0 09/13/2015 1205   BASOSABS 0.0 09/13/2015 1205       Review of Systems Past Medical History:  Diagnosis Date  . Breast cancer ([place])   . Breast cancer of upper-outer quadrant of right female breast ([place]) 06/11/2015   Triple negative, 2 cm   . Chemotherapy induced neutropenia ([place]) 09/10/2015  . Diabetes mellitus ([place])    Type 1 over 15 yrs  . Environmental allergies   . GERD (gastroesophageal reflux disease)   . Iron  deficiency anemia due to chronic blood loss 06/12/2015  . 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 CESAREAN SECTION    . COLONOSCOPY N/A 10/09/2013   Procedure: COLONOSCOPY;  Surgeon: Rogene Houston, MD;  Location: [facility];  Service: Endoscopy;  Laterality: N/A;  100  . DILATION AND CURETTAGE OF UTERUS     x 2 for miscarriages  . ESOPHAGOGASTRODUODENOSCOPY    . FLEXIBLE SIGMOIDOSCOPY    . FLEXIBLE SIGMOIDOSCOPY  07/27/2011   Procedure: FLEXIBLE SIGMOIDOSCOPY;  Surgeon: Rogene Houston, MD;  Location: [facility];  Service: Endoscopy;  Laterality: N/A;  100    Allergies  Allergen Reactions  . Ace Inhibitors Itching  . Adhesive [Tape] Rash  . Pravastatin Sodium Itching  . Statins Itching  . Peanuts [Peanut Oil] Itching  . Shellfish Allergy Itching and Rash    Current Outpatient Prescriptions on File Prior to Visit  Medication Sig Dispense Refill  . acetaminophen (TYLENOL) 500 MG tablet Take 1,000 mg by mouth every 6 (six) hours as needed. For pain    . acyclovir (ZOVIRAX) 200 MG capsule Take 200 mg by mouth 2 (two) times  daily. Reported on 07/23/2015    . albuterol (PROVENTIL HFA;VENTOLIN HFA) 108 (90 Base) MCG/ACT inhaler Inhale into the lungs every 6 (six) hours as needed for wheezing or shortness of breath.    . APRISO 0.375 g 24 hr capsule TAKE FOUR (4) CAPSULES BY MOUTH EVERY MORNING 120 capsule 5  . clotrimazole-betamethasone (LOTRISONE) cream Apply 1 application topically 2 (two) times daily. Apply to groin 30 g 0  . cyclobenzaprine (FLEXERIL) 5 MG tablet Take 1 tablet (5 mg total) by mouth 3 (three) times daily as needed for muscle spasms. 60 tablet 1  . doxycycline (VIBRA-TABS) 100 MG tablet Take 1 tablet (100 mg total) by mouth 2 (two) times daily. 13 tablet 0  . EPINEPHrine (EPIPEN) 0.3 mg/0.3 mL SOAJ injection Inject into the muscle as needed.    [name] FLUoxetine (PROZAC) 20 MG capsule Take 20 mg by mouth  daily.    [name] gabapentin (NEURONTIN) 100 MG capsule Take 100 mg by mouth 3 (three) times daily.     [name] HYDROcodone-acetaminophen (NORCO/VICODIN) 5-325 MG tablet Take 1 tablet by mouth every 4 (four) hours as needed for moderate pain. 30 tablet 0  . ibuprofen (ADVIL,MOTRIN) 200 MG tablet Take 400 mg by mouth every 6 (six) hours as needed for moderate pain.    [name] insulin glargine (LANTUS) 100 UNIT/ML injection Inject 20 Units into the skin at bedtime.     . insulin lispro protamine-insulin lispro (HUMALOG 50/50) (50-50) 100 UNIT/ML SUSP Inject 4 Units into the skin 4 (four) times daily. With each meal    . lidocaine-prilocaine (EMLA) cream Apply a quarter size amount to port site 1 hour prior to chemo. Do not rub in. Cover with plastic wrap. 30 g 3  . loratadine (CLARITIN) 10 MG tablet TAKE ONE TABLET BY MOUTH DAILY FOR HIVES.  6  . LORazepam (ATIVAN) 0.5 MG tablet Take 1 tablet (0.5 mg total) by mouth every 8 (eight) hours. For nausea or anxiety 45 tablet 0  . Melatonin 5 MG TABS Take 5 mg by mouth at bedtime.    . mometasone (NASONEX) 50 MCG/ACT nasal spray Place 2 sprays into the nose daily as needed.     . mupirocin ointment (BACTROBAN) 2 % Place 1 application into the nose 2 (two) times daily. (Patient taking differently: Place 1 application into the nose 2 (two) times daily as needed (irritation). ) 22 g 0  . omeprazole (PRILOSEC) 20 MG capsule Take 20 mg by mouth 2 (two) times daily before a meal.     . ondansetron (ZOFRAN ODT) 8 MG disintegrating tablet Take 1 tablet (8 mg total) by mouth every 8 (eight) hours as needed for nausea or vomiting. 30 tablet 2  . polyethylene glycol powder (GLYCOLAX/MIRALAX) powder Take 8.5 g by mouth daily. (Patient taking differently: Take 8.5 g by mouth daily as needed for mild constipation. ) 255 g 5  . promethazine (PHENERGAN) 25 MG tablet Take 1 tablet (25 mg total) by mouth every 6 (six) hours as needed for nausea or vomiting. 30 tablet 2  . iron polysaccharides  (NIFEREX) 150 MG capsule Take 150 mg by mouth daily.     No current facility-administered medications on file prior to visit.        Objective:   Physical Exam Blood pressure (!) 152/90, pulse 80, temperature 99 F (37.2 C), height 5' 1"  (1.549 m), weight 186 lb 6.4 oz (84.6 kg), last menstrual period 09/04/2015.  Alert and oriented. Skin warm and dry. Oral mucosa is  moist.   . Sclera anicteric, conjunctivae is pink. Thyroid not enlarged. No cervical lymphadenopathy. Lungs clear. Heart regular rate and rhythm.  Abdomen is soft. Bowel sounds are positive. No hepatomegaly. No abdominal masses felt. No tenderness.  1+ edema to lower extremities.   Port cath left chest       Assessment & Plan:  UC. Am going to get a sed rate. OV in 6 months. Anemia. Patient needs to continue her iron.

## 2015-09-18 ENCOUNTER — Telehealth (HOSPITAL_COMMUNITY): Payer: Self-pay | Admitting: *Deleted

## 2015-09-18 DIAGNOSIS — B370 Candidal stomatitis: Secondary | ICD-10-CM

## 2015-09-18 LAB — SEDIMENTATION RATE: Sed Rate: 92 mm/hr — ABNORMAL HIGH (ref 0–20)

## 2015-09-18 MED ORDER — NYSTATIN 100000 UNIT/ML MT SUSP: 5.0000 mL | Freq: Four times a day (QID) | OROMUCOSAL | 0 refills | Status: AC

## 2015-09-18 NOTE — Telephone Encounter (Signed)
Nystatin swish and swallow sent to patients pharmacy per Dr. Whitney Muse. Called and notified patient who verbalized understanding.

## 2015-09-18 NOTE — Addendum Note (Signed)
Addended by: Jaynie Collins R on: 09/18/2015 01:06 PM   Modules accepted: Orders

## 2015-09-18 NOTE — Telephone Encounter (Signed)
Patient called with issues/concerns about her throat. She said it was difficult to explain exactly what was going on. Reports she is having difficulty swallowing and it is making it hard for her to eat or drink anything. Reports her tongue feels overly sensitive to sugar, salt or anything spicy. Said there appears to be a film over her tongue but it is not white and she does not see any mouth sores/ulcers present. Reports fever 101 yesterday with chills. Reports she is still taking antibiotics. Please advise.

## 2015-09-22 ENCOUNTER — Encounter (HOSPITAL_BASED_OUTPATIENT_CLINIC_OR_DEPARTMENT_OTHER): Payer: Medicaid Other | Admitting: Hematology & Oncology

## 2015-09-22 ENCOUNTER — Encounter (HOSPITAL_COMMUNITY): Payer: Medicaid Other

## 2015-09-22 ENCOUNTER — Encounter: Payer: Self-pay | Admitting: *Deleted

## 2015-09-22 ENCOUNTER — Other Ambulatory Visit (HOSPITAL_COMMUNITY): Payer: Self-pay | Admitting: Hematology & Oncology

## 2015-09-22 ENCOUNTER — Encounter (HOSPITAL_COMMUNITY): Payer: Self-pay | Admitting: Hematology & Oncology

## 2015-09-22 VITALS — BP 146/75 | HR 77 | Temp 99.1°F | Resp 16 | Wt 173.2 lb

## 2015-09-22 DIAGNOSIS — E108 Type 1 diabetes mellitus with unspecified complications: Secondary | ICD-10-CM | POA: Diagnosis not present

## 2015-09-22 DIAGNOSIS — C50411 Malignant neoplasm of upper-outer quadrant of right female breast: Secondary | ICD-10-CM | POA: Diagnosis not present

## 2015-09-22 DIAGNOSIS — N179 Acute kidney failure, unspecified: Secondary | ICD-10-CM | POA: Diagnosis not present

## 2015-09-22 DIAGNOSIS — K219 Gastro-esophageal reflux disease without esophagitis: Secondary | ICD-10-CM | POA: Diagnosis not present

## 2015-09-22 DIAGNOSIS — R51 Headache: Principal | ICD-10-CM

## 2015-09-22 DIAGNOSIS — Z9889 Other specified postprocedural states: Secondary | ICD-10-CM | POA: Diagnosis not present

## 2015-09-22 DIAGNOSIS — F329 Major depressive disorder, single episode, unspecified: Secondary | ICD-10-CM

## 2015-09-22 DIAGNOSIS — T451X5A Adverse effect of antineoplastic and immunosuppressive drugs, initial encounter: Secondary | ICD-10-CM

## 2015-09-22 DIAGNOSIS — D701 Agranulocytosis secondary to cancer chemotherapy: Secondary | ICD-10-CM | POA: Diagnosis not present

## 2015-09-22 DIAGNOSIS — D500 Iron deficiency anemia secondary to blood loss (chronic): Secondary | ICD-10-CM

## 2015-09-22 DIAGNOSIS — Z808 Family history of malignant neoplasm of other organs or systems: Secondary | ICD-10-CM | POA: Diagnosis not present

## 2015-09-22 DIAGNOSIS — F32A Depression, unspecified: Secondary | ICD-10-CM

## 2015-09-22 DIAGNOSIS — C50919 Malignant neoplasm of unspecified site of unspecified female breast: Secondary | ICD-10-CM

## 2015-09-22 DIAGNOSIS — Z5111 Encounter for antineoplastic chemotherapy: Secondary | ICD-10-CM | POA: Diagnosis not present

## 2015-09-22 DIAGNOSIS — R519 Headache, unspecified: Secondary | ICD-10-CM

## 2015-09-22 LAB — COMPREHENSIVE METABOLIC PANEL
ALK PHOS: 102 U/L (ref 38–126)
ALT: 15 U/L (ref 14–54)
ANION GAP: 12 (ref 5–15)
AST: 21 U/L (ref 15–41)
Albumin: 3.3 g/dL — ABNORMAL LOW (ref 3.5–5.0)
BUN: 11 mg/dL (ref 6–20)
CALCIUM: 8.9 mg/dL (ref 8.9–10.3)
CHLORIDE: 99 mmol/L — AB (ref 101–111)
CO2: 30 mmol/L (ref 22–32)
Creatinine, Ser: 0.83 mg/dL (ref 0.44–1.00)
Glucose, Bld: 164 mg/dL — ABNORMAL HIGH (ref 65–99)
Potassium: 3.2 mmol/L — ABNORMAL LOW (ref 3.5–5.1)
SODIUM: 141 mmol/L (ref 135–145)
Total Bilirubin: 0.4 mg/dL (ref 0.3–1.2)
Total Protein: 7.1 g/dL (ref 6.5–8.1)

## 2015-09-22 LAB — CBC WITH DIFFERENTIAL/PLATELET
Basophils Absolute: 0 10*3/uL (ref 0.0–0.1)
Basophils Relative: 0 %
EOS ABS: 0 10*3/uL (ref 0.0–0.7)
EOS PCT: 0 %
HCT: 26.7 % — ABNORMAL LOW (ref 36.0–46.0)
Hemoglobin: 8.4 g/dL — ABNORMAL LOW (ref 12.0–15.0)
LYMPHS ABS: 1.4 10*3/uL (ref 0.7–4.0)
Lymphocytes Relative: 26 %
MCH: 26.3 pg (ref 26.0–34.0)
MCHC: 31.5 g/dL (ref 30.0–36.0)
MCV: 83.7 fL (ref 78.0–100.0)
Monocytes Absolute: 0.5 10*3/uL (ref 0.1–1.0)
Monocytes Relative: 9 %
Neutro Abs: 3.4 10*3/uL (ref 1.7–7.7)
Neutrophils Relative %: 65 %
Platelets: 391 10*3/uL (ref 150–400)
RBC: 3.19 MIL/uL — ABNORMAL LOW (ref 3.87–5.11)
RDW: 18.2 % — ABNORMAL HIGH (ref 11.5–15.5)
WBC: 5.3 10*3/uL (ref 4.0–10.5)

## 2015-09-22 MED ORDER — FLUOXETINE HCL 40 MG PO CAPS: 40.0000 mg | ORAL_CAPSULE | Freq: Every day | ORAL | 3 refills | Status: DC

## 2015-09-22 NOTE — Patient Instructions (Addendum)
Brittney [name] at [facility] Discharge Instructions  RECOMMENDATIONS MADE BY THE CONSULTANT AND ANY TEST RESULTS WILL BE SENT TO YOUR REFERRING PHYSICIAN.  You saw Dr. Whitney Muse today. Labs today. Follow up next week before chemo. New prescription for Prozac. Start Taxotere next week, every 3 weeks.  Thank you for choosing [place] at [facility] to provide your oncology and hematology care.  To afford each patient quality time with our provider, please arrive at least 15 minutes before your scheduled appointment time.   Beginning January 23rd 2017 lab work for the [facility] will be done in the  Main lab at [facility] on 1st floor. If you have a lab appointment with the [facility] please come in thru the  Main Entrance and check in at the main information desk  You need to re-schedule your appointment should you arrive 10 or more minutes late.  We strive to give you quality time with our providers, and arriving late affects you and other patients whose appointments are after yours.  Also, if you no show three or more times for appointments you may be dismissed from the clinic at the providers discretion.     Again, thank you for choosing [facility].  Our hope is that these requests will decrease the amount of time that you wait before being seen by our physicians.       _____________________________________________________________  Should you have questions after your visit to [facility], please contact our office at [phone] between the hours of 8:30 a.m. and 4:30 p.m.  Voicemails left after 4:30 p.m. will not be returned until the following business day.  For prescription refill requests, have your pharmacy contact our office.         Resources For Cancer Patients and their Caregivers ? American Cancer Society: Can assist with transportation, wigs, general needs, runs Look Good Feel Better.         ([phone] 176-9626 ? Cancer Care: Provides financial assistance, online support groups, medication/co-pay assistance.  1-800-813-HOPE 437-251-0201) ? [place] Assists [place] cancer patients and their families through emotional , educational and financial support.  812-650-4960 ? [place] DSS Where to apply for food stamps, Medicaid and utility assistance. 479 460 0187 ? [facility]: Transportation to medical appointments. (424) 556-0457 ? Social Security Administration: May apply for disability if have a Stage IV cancer. 6397090015 228-050-4165 ? [employer], Disability and Transit Services: Assists with nutrition, care and transit needs. [facility] Support Programs: @10RELATIVEDAYS @ > Cancer Support Group  2nd Tuesday of the month 1pm-2pm, Journey Room  > Creative Journey  3rd Tuesday of the month 1130am-1pm, Journey Room  > Look Good Feel Better  1st Wednesday of the month 10am-12 noon, Journey Room (Call American Cancer Society to register [phone])   Docetaxel injection What is this medicine? DOCETAXEL (doe se TAX el) is a chemotherapy drug. It targets fast dividing cells, like cancer cells, and causes these cells to die. This medicine is used to treat many types of cancers like breast cancer, certain stomach cancers, head and neck cancer, lung cancer, and prostate cancer. This medicine may be used for other purposes; ask your health care provider or pharmacist if you have questions. What should I tell my health care provider before I take this medicine? They need to know if you have any of these conditions: -infection (especially a virus infection such as chickenpox, cold sores, or  herpes) -liver disease -low blood counts, like low white cell, platelet, or red cell counts -an unusual or allergic reaction to docetaxel, polysorbate 80, other chemotherapy agents, other medicines, foods, dyes, or preservatives -pregnant or  trying to get pregnant -breast-feeding How should I use this medicine? This drug is given as an infusion into a vein. It is administered in a hospital or clinic by a specially trained health care professional. Talk to your pediatrician regarding the use of this medicine in children. Special care may be needed. Overdosage: If you think you have taken too much of this medicine contact a poison control center or emergency room at once. NOTE: This medicine is only for you. Do not share this medicine with others. What if I miss a dose? It is important not to miss your dose. Call your doctor or health care professional if you are unable to keep an appointment. What may interact with this medicine? -cyclosporine -erythromycin -ketoconazole -medicines to increase blood counts like filgrastim, pegfilgrastim, sargramostim -vaccines Talk to your doctor or health care professional before taking any of these medicines: -acetaminophen -aspirin -ibuprofen -ketoprofen -naproxen This list may not describe all possible interactions. Give your health care provider a list of all the medicines, herbs, non-prescription drugs, or dietary supplements you use. Also tell them if you smoke, drink alcohol, or use illegal drugs. Some items may interact with your medicine. What should I watch for while using this medicine? Your condition will be monitored carefully while you are receiving this medicine. You will need important blood work done while you are taking this medicine. This drug may make you feel generally unwell. This is not uncommon, as chemotherapy can affect healthy cells as well as cancer cells. Report any side effects. Continue your course of treatment even though you feel ill unless your doctor tells you to stop. In some cases, you may be given additional medicines to help with side effects. Follow all directions for their use. Call your doctor or health care professional for advice if you get a fever,  chills or sore throat, or other symptoms of a cold or flu. Do not treat yourself. This drug decreases your body's ability to fight infections. Try to avoid being around people who are sick. This medicine may increase your risk to bruise or bleed. Call your doctor or health care professional if you notice any unusual bleeding. This medicine may contain alcohol in the product. You may get drowsy or dizzy. Do not drive, use machinery, or do anything that needs mental alertness until you know how this medicine affects you. Do not stand or sit up quickly, especially if you are an older patient. This reduces the risk of dizzy or fainting spells. Avoid alcoholic drinks. Do not become pregnant while taking this medicine. Women should inform their doctor if they wish to become pregnant or think they might be pregnant. There is a potential for serious side effects to an unborn child. Talk to your health care professional or pharmacist for more information. Do not breast-feed an infant while taking this medicine. What side effects may I notice from receiving this medicine? Side effects that you should report to your doctor or health care professional as soon as possible: -allergic reactions like skin rash, itching or hives, swelling of the face, lips, or tongue -low blood counts - This drug may decrease the number of white blood cells, red blood cells and platelets. You may be at increased risk for infections and bleeding. -signs of infection -  fever or chills, cough, sore throat, pain or difficulty passing urine -signs of decreased platelets or bleeding - bruising, pinpoint red spots on the skin, black, tarry stools, nosebleeds -signs of decreased red blood cells - unusually weak or tired, fainting spells, lightheadedness -breathing problems -fast or irregular heartbeat -low blood pressure -mouth sores -nausea and vomiting -pain, swelling, redness or irritation at the injection site -pain, tingling, numbness  in the hands or feet -swelling of the ankle, feet, hands -weight gain Side effects that usually do not require medical attention (report to your prescriber or health care professional if they continue or are bothersome): -bone pain -complete hair loss including hair on your head, underarms, pubic hair, eyebrows, and eyelashes -diarrhea -excessive tearing -changes in the color of fingernails -loosening of the fingernails -nausea -muscle pain -red flush to skin -sweating -weak or tired This list may not describe all possible side effects. Call your doctor for medical advice about side effects. You may report side effects to FDA at 1-800-FDA-1088. Where should I keep my medicine? This drug is given in a hospital or clinic and will not be stored at home. NOTE: This sheet is a summary. It may not cover all possible information. If you have questions about this medicine, talk to your doctor, pharmacist, or health care provider.    2016, Elsevier/Gold Standard. (2014-01-06 16:04:57)

## 2015-09-22 NOTE — Progress Notes (Signed)
[facility] Psychosocial Distress Screening Clinical Social Work  Clinical Social Work was referred by distress screening protocol.  The patient scored a 5 on the Psychosocial Distress Thermometer which indicates moderate distress. Clinical Social Worker phoned pt at home to assess for distress and other psychosocial needs. 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 She reports she has medicaid for insurance and also receives food stamps for her and the children. She reports her mother is a good support to her and helps her care for herself and the children. Pt reports she was working prior to her diagnosis and finances are a concern. CSW reviewed additional resources to assist with financial support such as [employer], [employer], and [employer]. CSW provided numbers for pt to follow up. CSW also reviewed Support Programs through the [facility], such as Cancer Support group and Creative Journey. CSW plans to follow up with pt at future appointments. Pt was not aware she had an appt today and CSW encouraged her to phone [facility] to confirm.     Clinical Social Worker follow up needed: Yes.    If yes, follow up plan:  See above   ONCBCN DISTRESS SCREENING 09/10/2015  Screening Type Initial Screening  Distress experienced in past week (1-10) 5  Practical problem type Work/school  Physical Problem type Sleep/insomnia;Constipation/diarrhea;Skin dry/itchy  Physician notified of physical symptoms Yes  Referral to clinical psychology No  Referral to clinical social work No  Referral to dietition No  Referral to financial advocate No  Referral to support programs No  Referral to palliative care No  Other my right axillary area swelling and draining, call on phone to get her    Clinical Social Work interventions: Resource education and referral Supportive listening   [place], [place] Tuesdays   [phone]  [phone]

## 2015-09-22 NOTE — Progress Notes (Signed)
[place]  PROGRESS NOTE  Patient Care Team: [name], MD as PCP - General (Unknown Physician Specialty)  CHIEF COMPLAINTS/PURPOSE OF CONSULTATION:     Breast cancer of upper-outer quadrant of right female breast ([place])   04/29/2015 Initial Biopsy    upper outer quadrant mass R breast 2.9 cm by ultrasound, high grade invasive ductal carcinoma ER< 1%, PR 1%, HER -2 not amplified, Ki-67 66%      05/13/2015 Cancer Staging    pT1cN0      05/13/2015 Surgery    lumpectomy and sentinel node biopsies X 3, 2 cm tumor, 3 negative nodes       05/21/2015 Imaging    CT [facility]/P [facility] with postsurgical changes related to R breast lympectomy and R axillary LN dissection, no findings suspicious for metastatic disease      06/12/2015 Procedure    Port placement by Dr. Anthony Sar      06/17/2015 Imaging    MUGA- Normal left ventricular ejection fraction equal to 62.9%.      06/25/2015 - 08/13/2015 Chemotherapy    AC x 4      08/27/2015 -  Chemotherapy    Weekly Taxol       HISTORY OF PRESENTING ILLNESS:  Brittney [name] 45 y.o. 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 Patient was receiving weekly Taxol and had an ANC of zero. She was hospitalized and given antibiotics, which led to acute renal failure. She has been taken off weekly Taxol treatment.  Patient experiences a persistent cough and some congestion. She also experiences a chemo taste. Although she has full range of motion in her right arm, some numbness persists from surgery. [name] reports recent headaches in the front of her head, which says may or may not be sinus related. She says these headaches began after her last treatment and are alleviated with medication. They do not wake her up at night. She also reports hearing her heartbeat in her ear.   [name] states that she is feeling "hopeless and useless." Being hospitalized really  affected her emotionally. She says she is just trying to feel well and the harder she tries, the worse she feels. It does not make her feel any better that she is getting closer to being done with therapy. She is tired and overwhelmed. Her only support are her parents and patient says they try to help her as much as they can.  She denies wanting to harm herself, she just "feels low."  She is anxious to restart therapy.    MEDICAL HISTORY:  Past Medical History:  Diagnosis Date  . Breast cancer ([place])   . Breast cancer of upper-outer quadrant of right female breast ([place]) 06/11/2015   Triple negative, 2 cm   . Chemotherapy induced neutropenia ([place]) 09/10/2015  . Diabetes mellitus ([place])    Type 1 over 15 yrs  . Environmental allergies   . GERD (gastroesophageal reflux disease)   . Iron deficiency anemia due to chronic blood loss 06/12/2015  . 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 CESAREAN SECTION    . COLONOSCOPY N/A 10/09/2013   Procedure: COLONOSCOPY;  Surgeon: Rogene Houston, MD;  Location[facility];  Service: Endoscopy;  Laterality: N/A;  100  . DILATION AND CURETTAGE OF UTERUS     x  2 for miscarriages  . ESOPHAGOGASTRODUODENOSCOPY    . FLEXIBLE SIGMOIDOSCOPY    . FLEXIBLE SIGMOIDOSCOPY  07/27/2011   Procedure: FLEXIBLE SIGMOIDOSCOPY;  Surgeon: Rogene Houston, MD;  Location: [facility];  Service: Endoscopy;  Laterality: N/A;  100    SOCIAL HISTORY: Social History   Social History  . Marital status: Single    Spouse name: N/A  . Number of children: N/A  . Years of education: N/A   Occupational History  . Not on file.   Social History Main Topics  . Smoking status: Never Smoker  . Smokeless tobacco: Never Used  . Alcohol use No  . Drug use: No  . Sexual activity: Not on file   Other Topics Concern  . Not on file   Social History Narrative  . No  narrative on file   She is single. Has a 60 year old daughter. Aged 77. Works in home health care. Also going to school. Has to sit out her very last term in order to do her chemotherapy. Working toward RNA/CNA.  She does not smoke.  She loves to read, spend time with her family. She used to like to shop but doesnm't have a lot of money to spend now, so it's at the bottom of the list.   FAMILY HISTORY: Family History  Problem Relation Age of Onset  . Non-Hodgkin's lymphoma Mother   . Hypertension Mother   . Diabetes Mellitus II Mother   . Hypertension Father   . Diabetes Mellitus II Father   . Colon cancer Cousin   . Breast cancer Maternal Aunt    Mom diagnosed in 2013 with non-hodgkin's lymphoma. Had chemotherapy, radiation, more chemotherapy, and stem-cell at [facility] using her cells. Her mother has been in remission since. Mother is aged 11 this month (June 20th, 2017). Father is 37 as well; has high blood pressure. PTSD. Has been treated at the [facility] in [place]. She believes he has high cholesterol too, like her mother. Otherwise, he is "alright I guess."  Has an older brother, and a younger sister. No one else in her immediate family with cancer.   ALLERGIES:  is allergic to ace inhibitors; adhesive [tape]; pravastatin sodium; statins; peanuts [peanut oil]; and shellfish allergy.  MEDICATIONS:  Current Outpatient Prescriptions  Medication Sig Dispense Refill  . acetaminophen (TYLENOL) 500 MG tablet Take 1,000 mg by mouth every 6 (six) hours as needed. For pain    . acyclovir (ZOVIRAX) 200 MG capsule Take 200 mg by mouth 2 (two) times daily. Reported on 07/23/2015    . albuterol (PROVENTIL HFA;VENTOLIN HFA) 108 (90 Base) MCG/ACT inhaler Inhale into the lungs every 6 (six) hours as needed for wheezing or shortness of breath.    . APRISO 0.375 g 24 hr capsule TAKE FOUR (4) CAPSULES BY MOUTH EVERY MORNING 120 capsule 5  . clotrimazole-betamethasone (LOTRISONE) cream  Apply 1 application topically 2 (two) times daily. Apply to groin 30 g 0  . cyclobenzaprine (FLEXERIL) 5 MG tablet Take 1 tablet (5 mg total) by mouth 3 (three) times daily as needed for muscle spasms. 60 tablet 1  . EPINEPHrine (EPIPEN) 0.3 mg/0.3 mL SOAJ injection Inject into the muscle as needed.    [name] FLUoxetine (PROZAC) 20 MG capsule Take 20 mg by mouth daily.    [name] gabapentin (NEURONTIN) 100 MG capsule Take 100 mg by mouth 3 (three) times daily.     [name] HYDROcodone-acetaminophen (NORCO/VICODIN) 5-325 MG tablet Take 1 tablet by mouth every  4 (four) hours as needed for moderate pain. 30 tablet 0  . ibuprofen (ADVIL,MOTRIN) 200 MG tablet Take 400 mg by mouth every 6 (six) hours as needed for moderate pain.    [name] insulin glargine (LANTUS) 100 UNIT/ML injection Inject 20 Units into the skin at bedtime.     . insulin lispro protamine-insulin lispro (HUMALOG 50/50) (50-50) 100 UNIT/ML SUSP Inject 4 Units into the skin 4 (four) times daily. With each meal    . iron polysaccharides (NIFEREX) 150 MG capsule Take 150 mg by mouth daily.    [name] lidocaine-prilocaine (EMLA) cream Apply a quarter size amount to port site 1 hour prior to chemo. Do not rub in. Cover with plastic wrap. 30 g 3  . loratadine (CLARITIN) 10 MG tablet TAKE ONE TABLET BY MOUTH DAILY FOR HIVES.  6  . LORazepam (ATIVAN) 0.5 MG tablet Take 1 tablet (0.5 mg total) by mouth every 8 (eight) hours. For nausea or anxiety 45 tablet 0  . Melatonin 5 MG TABS Take 5 mg by mouth at bedtime.    . mometasone (NASONEX) 50 MCG/ACT nasal spray Place 2 sprays into the nose daily as needed.     . mupirocin ointment (BACTROBAN) 2 % Place 1 application into the nose 2 (two) times daily. (Patient taking differently: Place 1 application into the nose 2 (two) times daily as needed (irritation). ) 22 g 0  . nystatin (MYCOSTATIN) 100000 UNIT/ML suspension Take 5 mLs (500,000 Units total) by mouth 4 (four) times daily. Swish and swallow 473 mL 0  . omeprazole  (PRILOSEC) 20 MG capsule Take 20 mg by mouth 2 (two) times daily before a meal.     . ondansetron (ZOFRAN ODT) 8 MG disintegrating tablet Take 1 tablet (8 mg total) by mouth every 8 (eight) hours as needed for nausea or vomiting. 30 tablet 2  . polyethylene glycol powder (GLYCOLAX/MIRALAX) powder Take 8.5 g by mouth daily. (Patient taking differently: Take 8.5 g by mouth daily as needed for mild constipation. ) 255 g 5  . promethazine (PHENERGAN) 25 MG tablet Take 1 tablet (25 mg total) by mouth every 6 (six) hours as needed for nausea or vomiting. 30 tablet 2   No current facility-administered medications for this visit.     Review of Systems  Constitutional: Negative.  Negative for chills, fever, malaise/fatigue and weight loss.       Feels hopeless  HENT: Negative for congestion, hearing loss, nosebleeds, sore throat and tinnitus.   Eyes: Negative.  Negative for blurred vision, double vision, pain and discharge.  Respiratory: Negative.  Negative for cough, hemoptysis, sputum production, shortness of breath and wheezing.   Cardiovascular: Negative.  Negative for chest pain, palpitations, claudication, leg swelling and PND.  Gastrointestinal: Negative.  Negative for abdominal pain, blood in stool, constipation, diarrhea, heartburn, melena, nausea and vomiting.  Genitourinary: Negative.  Negative for dysuria, frequency, hematuria and urgency.  Musculoskeletal: Positive for back pain and joint pain. Negative for falls and myalgias.       Musculoskeletal pain from taxol  Skin: Negative.  Negative for itching and rash.  Neurological: Positive for tingling and headaches. Negative for dizziness, tremors, sensory change, speech change, focal weakness, seizures, loss of consciousness and weakness.       Numbness in right arm after surgery   Endo/Heme/Allergies: Negative.  Does not bruise/bleed easily.  Psychiatric/Behavioral: Negative.  Negative for depression, memory loss, substance abuse and  suicidal ideas. The patient is not nervous/anxious and does not have insomnia.  All other systems reviewed and are negative.  14 point ROS was done and is otherwise as detailed above or in HPI   PHYSICAL EXAMINATION: ECOG PERFORMANCE STATUS: 1 - Symptomatic but completely ambulatory  Vitals:   09/22/15 1426  BP: (!) 146/75  Pulse: 77  Resp: 16  Temp: 99.1 F (37.3 C)   Filed Weights   09/22/15 1426  Weight: 173 lb 3.2 oz (78.6 kg)    Physical Exam  Constitutional: She is oriented to person, place, and time and well-developed, well-nourished, and in no distress.  alopecia  HENT:  Head: Normocephalic and atraumatic.  Nose: Nose normal.  Mouth/Throat: Oropharynx is clear and moist. No oropharyngeal exudate.  Eyes: Conjunctivae and EOM are normal. Pupils are equal, round, and reactive to light. Right eye exhibits no discharge. Left eye exhibits no discharge. No scleral icterus.  Neck: Normal range of motion. Neck supple. No tracheal deviation present. No thyromegaly present.  Cardiovascular: Normal rate, regular rhythm and normal heart sounds.  Exam reveals no gallop and no friction rub.   No murmur heard. Pulmonary/Chest: Effort normal and breath sounds normal. She has no wheezes. She has no rales.  Abdominal: Soft. Bowel sounds are normal. She exhibits no distension and no mass. There is no tenderness. There is no rebound and no guarding.  Musculoskeletal: Normal range of motion. She exhibits no edema.  Lymphadenopathy:    She has no cervical adenopathy.  Neurological: She is alert and oriented to person, place, and time. She has normal reflexes. No cranial nerve deficit. Gait normal. Coordination normal.  Skin: Skin is warm and dry. No rash noted.  Bilateral axillary regions with small superficial comedones  Psychiatric: Mood, memory, affect and judgment normal.  Nursing note and vitals reviewed.    LABORATORY DATA: I have reviewed the data as listed.  Results for  GUIDA, ASMAN (MRN [id number]) as of 09/22/2015 17:16  Ref. Range 09/22/2015 15:07  Sodium Latest Ref Range: 135 - 145 mmol/L 141  Potassium Latest Ref Range: 3.5 - 5.1 mmol/L 3.2 (L)  Chloride Latest Ref Range: 101 - 111 mmol/L 99 (L)  CO2 Latest Ref Range: 22 - 32 mmol/L 30  BUN Latest Ref Range: 6 - 20 mg/dL 11  Creatinine Latest Ref Range: 0.44 - 1.00 mg/dL 0.83  Calcium Latest Ref Range: 8.9 - 10.3 mg/dL 8.9  EGFR (Non-African Amer.) Latest Ref Range: >60 mL/min >60  EGFR (African American) Latest Ref Range: >60 mL/min >60  Glucose Latest Ref Range: 65 - 99 mg/dL 164 (H)  Anion gap Latest Ref Range: 5 - 15  12  Alkaline Phosphatase Latest Ref Range: 38 - 126 U/L 102  Albumin Latest Ref Range: 3.5 - 5.0 g/dL 3.3 (L)  AST Latest Ref Range: 15 - 41 U/L 21  ALT Latest Ref Range: 14 - 54 U/L 15  Total Protein Latest Ref Range: 6.5 - 8.1 g/dL 7.1  Total Bilirubin Latest Ref Range: 0.3 - 1.2 mg/dL 0.4  WBC Latest Ref Range: 4.0 - 10.5 K/uL 5.3  RBC Latest Ref Range: 3.87 - 5.11 MIL/uL 3.19 (L)  Hemoglobin Latest Ref Range: 12.0 - 15.0 g/dL 8.4 (L)  HCT Latest Ref Range: 36.0 - 46.0 % 26.7 (L)  MCV Latest Ref Range: 78.0 - 100.0 fL 83.7  MCH Latest Ref Range: 26.0 - 34.0 pg 26.3  MCHC Latest Ref Range: 30.0 - 36.0 g/dL 31.5  RDW Latest Ref Range: 11.5 - 15.5 % 18.2 (H)  Platelets Latest Ref Range: 150 -  400 K/uL 391  Neutrophils Latest Units: % 65  Lymphocytes Latest Units: % 26  Monocytes Relative Latest Units: % 9  Eosinophil Latest Units: % 0  Basophil Latest Units: % 0  NEUT# Latest Ref Range: 1.7 - 7.7 K/uL 3.4  Lymphocyte # Latest Ref Range: 0.7 - 4.0 K/uL 1.4  Monocyte # Latest Ref Range: 0.1 - 1.0 K/uL 0.5  Eosinophils Absolute Latest Ref Range: 0.0 - 0.7 K/uL 0.0  Basophils Absolute Latest Ref Range: 0.0 - 0.1 K/uL 0.0   RADIOGRAPHY: I have reviewed the data as listed below.  Study Result   CLINICAL DATA:  Right-sided breast cancer.  EXAM: NUCLEAR MEDICINE  CARDIAC BLOOD POOL IMAGING ([facility])  TECHNIQUE: Cardiac multi-gated acquisition was performed at rest following intravenous injection of Tc-24mlabeled red blood cells.  RADIOPHARMACEUTICALS:  25.0 mCi Tc-965mDP in-vitro labeled red blood cells IV  COMPARISON:  None.  FINDINGS: The left ventricular ejection fraction is equal to 62.9%. On 10/11/2012 this equaled 57%. Normal left ventricular wall motion.  IMPRESSION: 1. Normal left ventricular ejection fraction equal to 62.9%.   Electronically Signed   By: TaKerby Moors.D.   On: 06/17/2015 15:19    PATHOLOGY:     ASSESSMENT & PLAN:  pT1cN0 triple negative carcinoma of the R breast S/P lumpectomy and sentinel LN procedure Iron deficiency Anemia Ulcerative Colitis followed by Dr. ReLaural Goldenngoing Menses Anemia Negative [facility] Taxol induced musculoskeletal pain Hidradenitis Hypokalemia ARF secondary to antibiotics Depression  Patient appeared distressed today. We spent a lot of time discussing how she was feeling. I had her meet with GrAbby Potasht the end of our visit. I have increased her prozac to 40 mg daily.  We discussed options to complete therapy including moving forward with weekly taxol (using neupogen), dose dense taxol or taxotere, Q 3 week taxol or taxotere. She wishes to proceed with taxotere.   I have ordered a CMP to recheck her kidney function and CBC with diff.  I will call her with her lab results.   With regards to chemo taste, I recommended the patient try using plastic utensils, instead of silverware. I also recommended she experiment with different foods, even foods that she may not have liked before.   RTC prior to chemotherapy next week, with labs and PE.   Orders Placed This Encounter  Procedures  . CBC with Differential    Standing Status:   Future    Number of Occurrences:   1    Standing Expiration Date:   09/21/2016  . Comprehensive  metabolic panel    Standing Status:   Future    Number of Occurrences:   1    Standing Expiration Date:   09/21/2016  . CBC with Differential    Standing Status:   Standing    Number of Occurrences:   20    Standing Expiration Date:   09/23/2016  . Comprehensive metabolic panel    Standing Status:   Standing    Number of Occurrences:   20    Standing Expiration Date:   09/23/2016    All questions were answered. The patient knows to call the clinic with any problems, questions or concerns.  This document serves as a record of services personally performed by ShAncil LinseyMD. It was created on her behalf by DaElmyra Ricksa trained medical scribe. The creation of this record is based on the scribe's personal observations and the provider's statements to them. This  document has been checked and approved by the attending provider.  I have reviewed the above documentation for accuracy and completeness and I agree with the above.  This note was electronically signed.  Molli Hazard, MD  09/22/2015 2:33 PM

## 2015-09-23 ENCOUNTER — Other Ambulatory Visit (HOSPITAL_COMMUNITY): Payer: Self-pay | Admitting: Emergency Medicine

## 2015-09-23 ENCOUNTER — Other Ambulatory Visit (HOSPITAL_COMMUNITY): Payer: Self-pay

## 2015-09-23 MED ORDER — DEXAMETHASONE 4 MG PO TABS: 8.0000 mg | ORAL_TABLET | Freq: Two times a day (BID) | ORAL | 1 refills | Status: DC

## 2015-09-23 MED ORDER — POTASSIUM CHLORIDE CRYS ER 20 MEQ PO TBCR: 20.0000 meq | EXTENDED_RELEASE_TABLET | Freq: Every day | ORAL | 2 refills | Status: DC

## 2015-09-23 NOTE — Progress Notes (Signed)
[place] Clinical Social Work  Clinical Social Work was referred by Futures trader for assessment of psychosocial needs due to hopelessness and depression. Clinical Social Worker met with patient at [facility] after her MD visit to offer support and assess for needs. CSW had spoken with pt at length earlier this am in response to distress screen. Pt tearful now and expressed frustration due to side effects from her treatment. Pt feeling very guilty about not being able to care for her child and her sister's children whom she has current custody of while her sister "is in treatment".  Pt experiencing very appropriate emotions. Supportive listening and education via coping techniques provided.   Pt reports h/o depression and has been on antidepressants in the past. Pt reports she has been on antidepressants a few times and they have helped, but then she discontinues her medications. Pt denies SI, denies plan to harm herself or others and clearly states "I would never hurt myself". She reports to not have a psychiatrist currently in the community and Dr. Whitney Muse plans to start pt on medication for depression. Pt has been to counseling in the past and is considering this option for additional support. CSW reviewed additional supports of Support Programs both at [facility] and [facility]. CSW strongly encouraged pt to consider attending support groups. CSW offered to provide individual counseling at [facility] on Tuesdays when CSW is onsite. CSW also offered Pt Peer Mentor through [employer] for additional phone support. Pt not ready to commit to either option, but is willing to consider. CSW plans to follow up with pt via phone later this week to reassess needs and see if pt is open to counseling appt or mentor referral.     Clinical Social Work interventions: Hospital doctor and referral  Grier Taje Littler, [place] Tuesdays   [phone]

## 2015-09-23 NOTE — Patient Instructions (Signed)
[place]   CHEMOTHERAPY INSTRUCTIONS  Premeds:  Dexamethasone - steroid - given to reduce the risk of you having an allergic type reaction to the chemotherapy. Dex can cause you to feel energized, nervous/anxious/jittery, make you have trouble sleeping, and/or make you feel hot/flushed in the face/neck and/or look pink/red in the face/neck. These side effects will pass as the Dex wears off. (takes 20 minutes to infuse)  Taxotere - bone marrow suppression (lowers white blood cells (fight infection), lowers red blood cells (make up your blood), lowers platelets (help blood to clot). This chemo can cause fluid retention. You will be responsible for taking a steroid called Dexamethasone at home the day before, day of and day after Taxotere. This steroid will keep you from having fluid retention. Take it whether you think you need it or not. Can cause hair loss, skin/nail changes (darkening of the nail beds, pain where the nail bed meets the skin, loosening of the nail beds, dry skin, palms of hands and soles of feet may darken or get sensitive, nausea/vomiting, paresthesia (numbness or tingling) in extremities - we need to know if this develops, mucositis (inflammation of any mucosal membrane - the mouth, throat), mouth sores, neurotoxicity (loss of memory, headaches, trouble sleeping, etc.), can also cause excessive tear production. Please let us know if any side effect develops. (takes 1 hour to infuse)  Neulasta - this medication is not chemo but being given because you have had chemo. It is usually given 24-27 hours after the completion of chemotherapy. This medication works by boosting your bone marrow's supply of white blood cells. White blood cells are what protect our bodies against infection. The medication is given in the form of a subcutaneous injection. It is given in the fatty tissue of your abdomen. It is a short needle. The major side effect of this medication is bone or muscle  pain. The drug of choice to relieve or lessen the pain is Aleve or Ibuprofen. If a physician has ever told you not to take Aleve or Ibuprofen - then don't take it. You should then take Tylenol/acetaminophen. Take either medication as the bottle directs you to.  The level of pain you experience as a result of this injection can range from none, to mild or moderate, or severe. Please let us know if you develop moderate or severe bone pain.      SELF CARE ACTIVITIES WHILE ON CHEMOTHERAPY:  Increase your fluid intake 48 hours prior to treatment and drink at least 2 quarts (64 oz of water/decaff beverages) per day after treatment. No alcohol intake. No aspirin or other medications unless approved by your oncologist. Eat foods that are light and easy to digest. Eat foods at cold or room temperature (as long as you aren't on the drug Oxaliplatin). No fried, fatty, or spicy foods immediately before or after treatment. Have teeth cleaned professionally before starting treatment. Keep dentures and partial plates clean. Use soft toothbrush and do not use mouthwashes that contain alcohol. Biotene is a good mouthwash that is available at most pharmacies or may be ordered by calling [phone]. Use warm salt water gargles (1 teaspoon salt per 1 quart warm water) before and after meals and at bedtime. Or you may rinse with 2 tablespoons of three-percent hydrogen peroxide mixed in eight ounces of water. Always use sunscreen that has not expired and with SPF (Sun Protection Factor) of 50 or higher. Wear hats to protect your head from the sun. Remember to  use sunscreen on your hands, ears, face, & feet. Use your nausea medication as directed to prevent nausea. Use your stool softener or laxative as directed to prevent constipation. Use your anti-diarrheal medication as directed to stop diarrhea.   Please wash your hands for at least 30 seconds using warm soapy water. Handwashing is the #1 way to prevent the spread of  germs. Stay away from sick people or people who are getting over a cold. If you develop respiratory systems such as green/yellow mucus production or productive cough or persistent cough let us know and we will see if you need an antibiotic. It is a good idea to keep a pair of gloves on when going into grocery stores/[employer] to decrease your risk of coming into contact with germs on the carts, etc. Carry alcohol hand gel with you at all times and use it frequently if out in public. All foods need to be cooked thoroughly. No raw foods. No medium or undercooked meats, eggs. If your food is cooked medium well, it does not need to be hot pink or saturated with bloody liquid at all. Vegetables and fruits need to be washed/rinsed under the faucet with a dish detergent before being consumed. You can eat raw fruits and vegetables unless we tell you otherwise but it would be best if you cooked them or bought frozen. Do not eat off of salad bars or hot bars unless you really trust the cleanliness of the restaurant. If you need dental work, please let Dr. Whitney Muse know before you go for your appointment so that we can coordinate the best possible time for you in regards to your chemo regimen. You need to also let your dentist know that you are actively taking chemo. We may need to do labs prior to your dental appointment. We also want your bowels moving at least every other day. If this is not happening, we need to know so that we can get you on a bowel regimen to help you go. If you are going to have sex, a condom must be used to protect the person that isn't taking chemotherapy. Chemo can decrease your libido (sex drive).   MEDICATIONS:  Dexamethasone 69m tablet. Take 2 tablets (8 mg total) by mouth 2 (two) times daily. Start the day before Taxotere. Then daily after chemo for 2 days. - Oral  Zofran/Ondansetron 870mtablet. Take 1 tablet every 8 hours as needed for nausea/vomiting. (#1 nausea med to take, this can  constipate)  Compazine/Prochlorperazine 1071mablet. Take 1 tablet every 6 hours as needed for nausea/vomiting. (#2 nausea med to take, this can make you sleepy)   EMLA cream. Apply a quarter size amount to port site 1 hour prior to chemo. Do not rub in. Cover with plastic wrap.   Over-the-Counter Meds:  Miralax 1 capful in 8 oz of fluid daily. May increase to two times a day if needed. This is a stool softener. If this doesn't work proceed you can add:  Senokot S  - start with 1 tablet two times a day and increase to 4 tablets two times a day if needed. (total of 8 tablets in a 24 hour period). This is a stimulant laxative.   Call us [place] this does not help your bowels move.   Imodium 2mg24mpsule. Take 2 capsules after the 1st loose stool and then 1 capsule every 2 hours until you go a total of 12 hours without having a loose stool. Call the [facility]  loose stools continue. If diarrhea occurs @ bedtime, take 2 capsules @ bedtime. Then take 2 capsules every 4 hours until morning. Call [facility].    SYMPTOMS TO REPORT AS SOON AS POSSIBLE AFTER TREATMENT:  FEVER GREATER THAN 100.5 F  CHILLS WITH OR WITHOUT FEVER  NAUSEA AND VOMITING THAT IS NOT CONTROLLED WITH YOUR NAUSEA MEDICATION  UNUSUAL SHORTNESS OF BREATH  UNUSUAL BRUISING OR BLEEDING  TENDERNESS IN MOUTH AND THROAT WITH OR WITHOUT PRESENCE OF ULCERS  URINARY PROBLEMS  BOWEL PROBLEMS  UNUSUAL RASH    Wear comfortable clothing and clothing appropriate for easy access to any Portacath or PICC line. Let us know if there is anything that we can do to make your therapy better!      I have been informed and understand all of the instructions given to me and have received a copy. I have been instructed to call the clinic ([phone])  or my family physician as soon as possible for continued medical care, if indicated. I do not have any more questions at this time but understand that I may call the [facility] at ([phone])  during office hours should I have questions or need assistance in obtaining follow-up care.      Docetaxel injection What is this medicine? DOCETAXEL (doe se TAX el) is a chemotherapy drug. It targets fast dividing cells, like cancer cells, and causes these cells to die. This medicine is used to treat many types of cancers like breast cancer, certain stomach cancers, head and neck cancer, lung cancer, and prostate cancer. This medicine may be used for other purposes; ask your health care provider or pharmacist if you have questions. What should I tell my health care provider before I take this medicine? They need to know if you have any of these conditions: -infection (especially a virus infection such as chickenpox, cold sores, or herpes) -liver disease -low blood counts, like low white cell, platelet, or red cell counts -an unusual or allergic reaction to docetaxel, polysorbate 80, other chemotherapy agents, other medicines, foods, dyes, or preservatives -pregnant or trying to get pregnant -breast-feeding How should I use this medicine? This drug is given as an infusion into a vein. It is administered in a hospital or clinic by a specially trained health care professional. Talk to your pediatrician regarding the use of this medicine in children. Special care may be needed. Overdosage: If you think you have taken too much of this medicine contact a poison control center or emergency room at once. NOTE: This medicine is only for you. Do not share this medicine with others. What if I miss a dose? It is important not to miss your dose. Call your doctor or health care professional if you are unable to keep an appointment. What may interact with this medicine? -cyclosporine -erythromycin -ketoconazole -medicines to increase blood counts like filgrastim, pegfilgrastim, sargramostim -vaccines Talk to your doctor or health care professional before taking any of these  medicines: -acetaminophen -aspirin -ibuprofen -ketoprofen -naproxen This list may not describe all possible interactions. Give your health care provider a list of all the medicines, herbs, non-prescription drugs, or dietary supplements you use. Also tell them if you smoke, drink alcohol, or use illegal drugs. Some items may interact with your medicine. What should I watch for while using this medicine? Your condition will be monitored carefully while you are receiving this medicine. You will need important blood work done while you are taking this medicine. This drug may make you feel  generally unwell. This is not uncommon, as chemotherapy can affect healthy cells as well as cancer cells. Report any side effects. Continue your course of treatment even though you feel ill unless your doctor tells you to stop. In some cases, you may be given additional medicines to help with side effects. Follow all directions for their use. Call your doctor or health care professional for advice if you get a fever, chills or sore throat, or other symptoms of a cold or flu. Do not treat yourself. This drug decreases your body's ability to fight infections. Try to avoid being around people who are sick. This medicine may increase your risk to bruise or bleed. Call your doctor or health care professional if you notice any unusual bleeding. This medicine may contain alcohol in the product. You may get drowsy or dizzy. Do not drive, use machinery, or do anything that needs mental alertness until you know how this medicine affects you. Do not stand or sit up quickly, especially if you are an older patient. This reduces the risk of dizzy or fainting spells. Avoid alcoholic drinks. Do not become pregnant while taking this medicine. Women should inform their doctor if they wish to become pregnant or think they might be pregnant. There is a potential for serious side effects to an unborn child. Talk to your health care  professional or pharmacist for more information. Do not breast-feed an infant while taking this medicine. What side effects may I notice from receiving this medicine? Side effects that you should report to your doctor or health care professional as soon as possible: -allergic reactions like skin rash, itching or hives, swelling of the face, lips, or tongue -low blood counts - This drug may decrease the number of white blood cells, red blood cells and platelets. You may be at increased risk for infections and bleeding. -signs of infection - fever or chills, cough, sore throat, pain or difficulty passing urine -signs of decreased platelets or bleeding - bruising, pinpoint red spots on the skin, black, tarry stools, nosebleeds -signs of decreased red blood cells - unusually weak or tired, fainting spells, lightheadedness -breathing problems -fast or irregular heartbeat -low blood pressure -mouth sores -nausea and vomiting -pain, swelling, redness or irritation at the injection site -pain, tingling, numbness in the hands or feet -swelling of the ankle, feet, hands -weight gain Side effects that usually do not require medical attention (report to your prescriber or health care professional if they continue or are bothersome): -bone pain -complete hair loss including hair on your head, underarms, pubic hair, eyebrows, and eyelashes -diarrhea -excessive tearing -changes in the color of fingernails -loosening of the fingernails -nausea -muscle pain -red flush to skin -sweating -weak or tired This list may not describe all possible side effects. Call your doctor for medical advice about side effects. You may report side effects to FDA at 1-800-FDA-1088. Where should I keep my medicine? This drug is given in a hospital or clinic and will not be stored at home. NOTE: This sheet is a summary. It may not cover all possible information. If you have questions about this medicine, talk to your  doctor, pharmacist, or health care provider.    2016, Elsevier/Gold Standard. (2014-01-06 16:04:57)       Pegfilgrastim injection What is this medicine? PEGFILGRASTIM (PEG fil gra stim) is a long-acting granulocyte colony-stimulating factor that stimulates the growth of neutrophils, a type of white blood cell important in the body's fight against infection. It is used to  reduce the incidence of fever and infection in patients with certain types of cancer who are receiving chemotherapy that affects the bone marrow, and to increase survival after being exposed to high doses of radiation. This medicine may be used for other purposes; ask your health care provider or pharmacist if you have questions. What should I tell my health care provider before I take this medicine? They need to know if you have any of these conditions: -kidney disease -latex allergy -ongoing radiation therapy -sickle cell disease -skin reactions to acrylic adhesives (On-Body Injector only) -an unusual or allergic reaction to pegfilgrastim, filgrastim, other medicines, foods, dyes, or preservatives -pregnant or trying to get pregnant -breast-feeding How should I use this medicine? This medicine is for injection under the skin. If you get this medicine at home, you will be taught how to prepare and give the pre-filled syringe or how to use the On-body Injector. Refer to the patient Instructions for Use for detailed instructions. Use exactly as directed. Take your medicine at regular intervals. Do not take your medicine more often than directed. It is important that you put your used needles and syringes in a special sharps container. Do not put them in a trash can. If you do not have a sharps container, call your pharmacist or healthcare provider to get one. Talk to your pediatrician regarding the use of this medicine in children. While this drug may be prescribed for selected conditions, precautions do apply. Overdosage:  If you think you have taken too much of this medicine contact a poison control center or emergency room at once. NOTE: This medicine is only for you. Do not share this medicine with others. What if I miss a dose? It is important not to miss your dose. Call your doctor or health care professional if you miss your dose. If you miss a dose due to an On-body Injector failure or leakage, a new dose should be administered as soon as possible using a single prefilled syringe for manual use. What may interact with this medicine? Interactions have not been studied. Give your health care provider a list of all the medicines, herbs, non-prescription drugs, or dietary supplements you use. Also tell them if you smoke, drink alcohol, or use illegal drugs. Some items may interact with your medicine. This list may not describe all possible interactions. Give your health care provider a list of all the medicines, herbs, non-prescription drugs, or dietary supplements you use. Also tell them if you smoke, drink alcohol, or use illegal drugs. Some items may interact with your medicine. What should I watch for while using this medicine? You may need blood work done while you are taking this medicine. If you are going to need a MRI, CT scan, or other procedure, tell your doctor that you are using this medicine (On-Body Injector only). What side effects may I notice from receiving this medicine? Side effects that you should report to your doctor or health care professional as soon as possible: -allergic reactions like skin rash, itching or hives, swelling of the face, lips, or tongue -dizziness -fever -pain, redness, or irritation at site where injected -pinpoint red spots on the skin -red or dark-brown urine -shortness of breath or breathing problems -stomach or side pain, or pain at the shoulder -swelling -tiredness -trouble passing urine or change in the amount of urine Side effects that usually do not require  medical attention (report to your doctor or health care professional if they continue or are bothersome): -bone  pain -muscle pain This list may not describe all possible side effects. Call your doctor for medical advice about side effects. You may report side effects to FDA at 1-800-FDA-1088. Where should I keep my medicine? Keep out of the reach of children. Store pre-filled syringes in a refrigerator between 2 and 8 degrees C (36 and 46 degrees F). Do not freeze. Keep in carton to protect from light. Throw away this medicine if it is left out of the refrigerator for more than 48 hours. Throw away any unused medicine after the expiration date. NOTE: This sheet is a summary. It may not cover all possible information. If you have questions about this medicine, talk to your doctor, pharmacist, or health care provider.    2016, Elsevier/Gold Standard. (2014-01-09 14:30:14)

## 2015-09-24 ENCOUNTER — Ambulatory Visit (HOSPITAL_COMMUNITY): Payer: Medicaid Other | Admitting: Hematology & Oncology

## 2015-09-24 ENCOUNTER — Ambulatory Visit (HOSPITAL_COMMUNITY): Payer: Medicaid Other

## 2015-09-29 ENCOUNTER — Encounter: Payer: Self-pay | Admitting: *Deleted

## 2015-09-29 NOTE — Progress Notes (Signed)
[facility] Clinical Social Work  Clinical Social Work was referred by need for CSW follow up for assessment of psychosocial needs due to issues with depression/adjustment to illness. Clinical Social Worker contacted patient at home to offer support and re-assess for needs.  Pt reports to be doing better emotionally this week, she feels the medicine may be starting to help some. CSW explained that it can take some time for it to really be effective. Pt stated understanding. Pt still considering counseling support, but not ready to commit at this time. CSW reviewed options for support and CSW availability for counseling. Pt may want to meet with pt next week for support and will call CSW to set up. CSW also reminded pt about support groups as well. Pt appreciated call and will reach out or just come for counseling next week.     Clinical Social Work interventions: Supportive listening Education  [place], [place] Tuesdays   [phone]

## 2015-09-30 ENCOUNTER — Other Ambulatory Visit (HOSPITAL_COMMUNITY): Payer: Self-pay | Admitting: Hematology & Oncology

## 2015-10-01 ENCOUNTER — Encounter (HOSPITAL_BASED_OUTPATIENT_CLINIC_OR_DEPARTMENT_OTHER): Payer: Medicaid Other | Admitting: Hematology & Oncology

## 2015-10-01 ENCOUNTER — Other Ambulatory Visit (HOSPITAL_COMMUNITY): Payer: Self-pay | Admitting: Hematology & Oncology

## 2015-10-01 ENCOUNTER — Encounter (HOSPITAL_COMMUNITY): Payer: Self-pay | Admitting: Hematology & Oncology

## 2015-10-01 ENCOUNTER — Ambulatory Visit (HOSPITAL_COMMUNITY): Payer: Medicaid Other

## 2015-10-01 ENCOUNTER — Encounter (HOSPITAL_COMMUNITY): Payer: Medicaid Other

## 2015-10-01 ENCOUNTER — Encounter (HOSPITAL_BASED_OUTPATIENT_CLINIC_OR_DEPARTMENT_OTHER): Payer: Medicaid Other

## 2015-10-01 VITALS — BP 157/83 | HR 75 | Temp 98.5°F | Resp 18

## 2015-10-01 VITALS — BP 144/80 | HR 82 | Temp 98.5°F | Resp 16 | Wt 165.5 lb

## 2015-10-01 DIAGNOSIS — D701 Agranulocytosis secondary to cancer chemotherapy: Secondary | ICD-10-CM | POA: Diagnosis not present

## 2015-10-01 DIAGNOSIS — C50411 Malignant neoplasm of upper-outer quadrant of right female breast: Secondary | ICD-10-CM

## 2015-10-01 DIAGNOSIS — C50919 Malignant neoplasm of unspecified site of unspecified female breast: Secondary | ICD-10-CM

## 2015-10-01 DIAGNOSIS — Z5111 Encounter for antineoplastic chemotherapy: Secondary | ICD-10-CM

## 2015-10-01 DIAGNOSIS — F329 Major depressive disorder, single episode, unspecified: Secondary | ICD-10-CM

## 2015-10-01 DIAGNOSIS — E108 Type 1 diabetes mellitus with unspecified complications: Secondary | ICD-10-CM

## 2015-10-01 DIAGNOSIS — D6481 Anemia due to antineoplastic chemotherapy: Secondary | ICD-10-CM | POA: Diagnosis not present

## 2015-10-01 DIAGNOSIS — T451X5A Adverse effect of antineoplastic and immunosuppressive drugs, initial encounter: Secondary | ICD-10-CM

## 2015-10-01 DIAGNOSIS — R52 Pain, unspecified: Secondary | ICD-10-CM

## 2015-10-01 DIAGNOSIS — R739 Hyperglycemia, unspecified: Secondary | ICD-10-CM

## 2015-10-01 DIAGNOSIS — F32A Depression, unspecified: Secondary | ICD-10-CM

## 2015-10-01 LAB — CBC WITH DIFFERENTIAL/PLATELET
BASOS ABS: 0 10*3/uL (ref 0.0–0.1)
BASOS PCT: 0 %
EOS PCT: 0 %
Eosinophils Absolute: 0 10*3/uL (ref 0.0–0.7)
HCT: 29.1 % — ABNORMAL LOW (ref 36.0–46.0)
Hemoglobin: 9.2 g/dL — ABNORMAL LOW (ref 12.0–15.0)
LYMPHS PCT: 10 %
Lymphs Abs: 0.7 10*3/uL (ref 0.7–4.0)
MCH: 26.3 pg (ref 26.0–34.0)
MCHC: 31.6 g/dL (ref 30.0–36.0)
MCV: 83.1 fL (ref 78.0–100.0)
MONO ABS: 0.1 10*3/uL (ref 0.1–1.0)
Monocytes Relative: 2 %
NEUTROS ABS: 6.2 10*3/uL (ref 1.7–7.7)
Neutrophils Relative %: 88 %
PLATELETS: 295 10*3/uL (ref 150–400)
RBC: 3.5 MIL/uL — AB (ref 3.87–5.11)
RDW: 18.2 % — AB (ref 11.5–15.5)
WBC: 7.1 10*3/uL (ref 4.0–10.5)

## 2015-10-01 LAB — COMPREHENSIVE METABOLIC PANEL
ALK PHOS: 87 U/L (ref 38–126)
ALT: 16 U/L (ref 14–54)
AST: 20 U/L (ref 15–41)
Albumin: 4 g/dL (ref 3.5–5.0)
Anion gap: 9 (ref 5–15)
BUN: 20 mg/dL (ref 6–20)
CALCIUM: 10 mg/dL (ref 8.9–10.3)
CHLORIDE: 99 mmol/L — AB (ref 101–111)
CO2: 26 mmol/L (ref 22–32)
CREATININE: 0.66 mg/dL (ref 0.44–1.00)
Glucose, Bld: 424 mg/dL — ABNORMAL HIGH (ref 65–99)
Potassium: 4.5 mmol/L (ref 3.5–5.1)
Sodium: 134 mmol/L — ABNORMAL LOW (ref 135–145)
Total Bilirubin: 1 mg/dL (ref 0.3–1.2)
Total Protein: 7.7 g/dL (ref 6.5–8.1)

## 2015-10-01 LAB — GLUCOSE, RANDOM: GLUCOSE: 310 mg/dL — AB (ref 65–99)

## 2015-10-01 MED ORDER — PEGFILGRASTIM 6 MG/0.6ML ~~LOC~~ PSKT
6.0000 mg | PREFILLED_SYRINGE | Freq: Once | SUBCUTANEOUS | Status: AC
  Administered 2015-10-01: 6 mg via SUBCUTANEOUS
  Filled 2015-10-01: qty 0.6

## 2015-10-01 MED ORDER — INSULIN ASPART 100 UNIT/ML ~~LOC~~ SOLN
8.0000 [IU] | Freq: Once | SUBCUTANEOUS | Status: DC
  Filled 2015-10-01: qty 0.08

## 2015-10-01 MED ORDER — DEXTROSE 5 % IV SOLN
75.0000 mg/m2 | Freq: Once | INTRAVENOUS | Status: AC
  Administered 2015-10-01: 140 mg via INTRAVENOUS
  Filled 2015-10-01: qty 8

## 2015-10-01 MED ORDER — LORAZEPAM 2 MG/ML IJ SOLN
INTRAMUSCULAR | Status: AC
  Filled 2015-10-01: qty 1

## 2015-10-01 MED ORDER — INSULIN ASPART 100 UNIT/ML ~~LOC~~ SOLN
6.0000 [IU] | Freq: Once | SUBCUTANEOUS | Status: AC
  Administered 2015-10-01: 6 [IU] via SUBCUTANEOUS
  Filled 2015-10-01: qty 0.06

## 2015-10-01 MED ORDER — SODIUM CHLORIDE 0.9% FLUSH
10.0000 mL | INTRAVENOUS | Status: DC | PRN
  Administered 2015-10-01: 10 mL
  Filled 2015-10-01: qty 10

## 2015-10-01 MED ORDER — HEPARIN SOD (PORK) LOCK FLUSH 100 UNIT/ML IV SOLN
500.0000 [IU] | Freq: Once | INTRAVENOUS | Status: AC | PRN
  Administered 2015-10-01: 500 [IU]
  Filled 2015-10-01: qty 5

## 2015-10-01 MED ORDER — DEXAMETHASONE SODIUM PHOSPHATE 100 MG/10ML IJ SOLN
10.0000 mg | Freq: Once | INTRAMUSCULAR | Status: AC
  Administered 2015-10-01: 10 mg via INTRAVENOUS
  Filled 2015-10-01: qty 1

## 2015-10-01 MED ORDER — LORAZEPAM 2 MG/ML IJ SOLN
0.5000 mg | Freq: Once | INTRAMUSCULAR | Status: AC
  Administered 2015-10-01: 0.5 mg via INTRAVENOUS

## 2015-10-01 MED ORDER — SODIUM CHLORIDE 0.9 % IV SOLN
Freq: Once | INTRAVENOUS | Status: AC
  Administered 2015-10-01: 11:00:00 via INTRAVENOUS

## 2015-10-01 NOTE — Progress Notes (Signed)
Mattingly S Hennon tolerated chemo tx with new medication with only complaints of nausea which were taken care of with Ativan. Labs reviewed with Dr. Whitney Muse prior to administering chemo and orders obtained for repeat Glucose level since Glucose drawn prior to pt taking her own insulin form home was 424. Repeat glucose level was 310 so 6 units of Novolog was given SQ per MD order. Taxotere was stopped after 15 minutes of infusion due to complaints of nausea, Dr. Whitney Muse was notified and pt was given IV Ativan which was effective and Taxotere was restarted after 30 minutes and pt tolerated it well. Neulasta on-pro was applied to pt's left arm and will be completed tomorrow at 7pm. Pt discharged self ambulatory in satisfactory condition with mother and will call for any problems

## 2015-10-01 NOTE — Patient Instructions (Addendum)
[facility] at [facility] Discharge Instructions  RECOMMENDATIONS MADE BY THE CONSULTANT AND ANY TEST RESULTS WILL BE SENT TO YOUR REFERRING PHYSICIAN.  You saw Dr. Whitney Muse today. Follow up in 3 weeks with lab work and chemo. You will be scheduled for IV fluids next Monday and Wednesday.  Thank you for choosing [place] at [facility] to provide your oncology and hematology care.  To afford each patient quality time with our provider, please arrive at least 15 minutes before your scheduled appointment time.   Beginning January 23rd 2017 lab work for the [facility] will be done in the  Main lab at [facility] on 1st floor. If you have a lab appointment with the [facility] please come in thru the  Main Entrance and check in at the main information desk  You need to re-schedule your appointment should you arrive 10 or more minutes late.  We strive to give you quality time with our providers, and arriving late affects you and other patients whose appointments are after yours.  Also, if you no show three or more times for appointments you may be dismissed from the clinic at the providers discretion.     Again, thank you for choosing [facility].  Our hope is that these requests will decrease the amount of time that you wait before being seen by our physicians.       _____________________________________________________________  Should you have questions after your visit to [facility], please contact our office at ([phone] between the hours of 8:30 a.m. and 4:30 p.m.  Voicemails left after 4:30 p.m. will not be returned until the following business day.  For prescription refill requests, have your pharmacy contact our office.         Resources For Cancer Patients and their Caregivers ? American Cancer Society: Can assist with transportation, wigs, general needs, runs Look Good Feel Better.         ([phone] 371-4493 ? Cancer Care: Provides financial assistance, online support groups, medication/co-pay assistance.  1-800-813-HOPE 450-730-5846) ? [facility] Assists [place] cancer patients and their families through emotional , educational and financial support.  907-527-0322 ? [place] DSS Where to apply for food stamps, Medicaid and utility assistance. 7145899202 ? [facility]: Transportation to medical appointments. 402-828-6785 ? Social Security Administration: May apply for disability if have a Stage IV cancer. (870)348-6530 (726)268-6829 ? [employer], Disability and Transit Services: Assists with nutrition, care and transit needs. [place] Support Programs: @10RELATIVEDAYS @ > Cancer Support Group  2nd Tuesday of the month 1pm-2pm, Journey Room  > Creative Journey  3rd Tuesday of the month 1130am-1pm, Journey Room  > Look Good Feel Better  1st Wednesday of the month 10am-12 noon, Journey Room (Call [place] to register [phone])

## 2015-10-01 NOTE — Patient Instructions (Signed)
[facility] Discharge Instructions for Patients Receiving Chemotherapy   Beginning January 23rd 2017 lab work for the [facility] will be done in the  Main lab at [facility] on 1st floor. If you have a lab appointment with the [facility] please come in thru the  Main Entrance and check in at the main information desk   Today you received the following chemotherapy agents Taxotere as well as Neulasta on pro. Follow-up as scheduled. Call clinic for any questions or concerns  To help prevent nausea and vomiting after your treatment, we encourage you to take your nausea medication   If you develop nausea and vomiting, or diarrhea that is not controlled by your medication, call the clinic.  The clinic phone number is [phone]. Office hours are Monday-Friday 8:30am-5:00pm.  BELOW ARE SYMPTOMS THAT SHOULD BE REPORTED IMMEDIATELY:  *FEVER GREATER THAN 101.0 F  *CHILLS WITH OR WITHOUT FEVER  NAUSEA AND VOMITING THAT IS NOT CONTROLLED WITH YOUR NAUSEA MEDICATION  *UNUSUAL SHORTNESS OF BREATH  *UNUSUAL BRUISING OR BLEEDING  TENDERNESS IN MOUTH AND THROAT WITH OR WITHOUT PRESENCE OF ULCERS  *URINARY PROBLEMS  *BOWEL PROBLEMS  UNUSUAL RASH Items with * indicate a potential emergency and should be followed up as soon as possible. If you have an emergency after office hours please contact your primary care physician or go to the nearest emergency department.  Please call the clinic during office hours if you have any questions or concerns.   You may also contact the Patient Navigator at [phone] should you have any questions or need assistance in obtaining follow up care.      Resources For Cancer Patients and their Caregivers ? American Cancer Society: Can assist with transportation, wigs, general needs, runs Look Good Feel Better.        530-744-3348 ? Cancer Care: Provides financial assistance, online support groups, medication/co-pay  assistance.  1-800-813-HOPE 303-327-4986) ? [facility] Assists [place] cancer patients and their families through emotional , educational and financial support.  907-022-2207 ? [place] DSS Where to apply for food stamps, Medicaid and utility assistance. 920-772-7986 ? RCATS: Transportation to medical appointments. 870-285-9816 ? Social Security Administration: May apply for disability if have a Stage IV cancer. 740-769-7439 [phone] ? [employer], Disability and Transit Services: Assists with nutrition, care and transit needs. [phone]

## 2015-10-01 NOTE — Progress Notes (Signed)
[place]  PROGRESS NOTE  Patient Care Team: [name], MD as PCP - General (Unknown Physician Specialty)  CHIEF COMPLAINTS/PURPOSE OF CONSULTATION:     Breast cancer of upper-outer quadrant of right female breast ([place])   04/29/2015 Initial Biopsy    upper outer quadrant mass R breast 2.9 cm by ultrasound, high grade invasive ductal carcinoma ER< 1%, PR 1%, HER -2 not amplified, Ki-67 66%      05/13/2015 Cancer Staging    pT1cN0      05/13/2015 Surgery    lumpectomy and sentinel node biopsies X 3, 2 cm tumor, 3 negative nodes       05/21/2015 Imaging    CT [facility] with postsurgical changes related to R breast lympectomy and R axillary LN dissection, no findings suspicious for metastatic disease      06/12/2015 Procedure    Port placement by Dr. Anthony Sar      06/17/2015 Imaging    MUGA- Normal left ventricular ejection fraction equal to 62.9%.      06/25/2015 - 08/13/2015 Chemotherapy    AC x 4      08/27/2015 - 09/03/2015 Chemotherapy    Weekly Taxol      09/11/2015 - 09/15/2015 Hospital Admission    Neutropenic fever, ARF secondary to antibiotics      10/01/2015 -  Chemotherapy    Taxotere 75 mg/m2 Q3 weeks         HISTORY OF PRESENTING ILLNESS:  Brittney [name] 45 y.o. 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 Patient states she is feeling better after having a chemotherapy break.  She picked up her muscle relaxant, in case she experiences anymore musculoskeletal pain. Brittney [name] states she is eating more than she was while on Taxol, but still not as much as she should be. Patient indicates she is experiencing some minor bowel issues, but nothing severe.   Patient says she spoke with Brittney [name] about joining a support group. She also notes her conversations with Brittney [name] have been helpful. She is willing to go to a support group.   Patient does not need a refill for any  medication.   She is better today and readily admits it.  The steroids have definitely increased her blood sugars. She notes that she is open to a SS insulin if needed.  No fever, no chills, no nausea.    MEDICAL HISTORY:  Past Medical History:  Diagnosis Date  . Breast cancer ([place])   . Breast cancer of upper-outer quadrant of right female breast ([place]) 06/11/2015   Triple negative, 2 cm   . Chemotherapy induced neutropenia ([place]) 09/10/2015  . Diabetes mellitus ([place])    Type 1 over 15 yrs  . Environmental allergies   . GERD (gastroesophageal reflux disease)   . Iron deficiency anemia due to chronic blood loss 06/12/2015  . 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 CESAREAN SECTION    . COLONOSCOPY N/A 10/09/2013   Procedure: COLONOSCOPY;  Surgeon: Rogene Houston, MD;  Location: [facility] ENDO SUITE;  Service: Endoscopy;  Laterality: N/A;  100  . DILATION AND CURETTAGE OF UTERUS     x 2 for miscarriages  . ESOPHAGOGASTRODUODENOSCOPY    . FLEXIBLE SIGMOIDOSCOPY    . FLEXIBLE SIGMOIDOSCOPY  07/27/2011   Procedure: FLEXIBLE SIGMOIDOSCOPY;  Surgeon: Rogene Houston,  MD;  Location: [facility] ENDO SUITE;  Service: Endoscopy;  Laterality: N/A;  100    SOCIAL HISTORY: Social History   Social History  . Marital status: Single    Spouse name: N/A  . Number of children: N/A  . Years of education: N/A   Occupational History  . Not on file.   Social History Main Topics  . Smoking status: Never Smoker  . Smokeless tobacco: Never Used  . Alcohol use No  . Drug use: No  . Sexual activity: Not on file   Other Topics Concern  . Not on file   Social History Narrative  . No narrative on file   She is single. Has a 83 year old daughter. Aged 54. Works in home health care. Also going to school. Has to sit out her very last term in order to do her chemotherapy. Working toward RNA/CNA.  She does  not smoke.  She loves to read, spend time with her family. She used to like to shop but doesnm't have a lot of money to spend now, so it's at the bottom of the list.   FAMILY HISTORY: Family History  Problem Relation Age of Onset  . Non-Hodgkin's lymphoma Mother   . Hypertension Mother   . Diabetes Mellitus II Mother   . Hypertension Father   . Diabetes Mellitus II Father   . Colon cancer Cousin   . Breast cancer Maternal Aunt    Mom diagnosed in 2013 with non-hodgkin's lymphoma. Had chemotherapy, radiation, more chemotherapy, and stem-cell at [facility] using her cells. Her mother has been in remission since. Mother is aged 28 this month (June 20th, 2017). Father is 5 as well; has high blood pressure. PTSD. Has been treated at the [facility] in [place]. She believes he has high cholesterol too, like her mother. Otherwise, he is "alright I guess."  Has an older brother, and a younger sister. No one else in her immediate family with cancer.   ALLERGIES:  is allergic to ace inhibitors; adhesive [tape]; pravastatin sodium; statins; peanuts [peanut oil]; and shellfish allergy.  MEDICATIONS:  Current Outpatient Prescriptions  Medication Sig Dispense Refill  . acetaminophen (TYLENOL) 500 MG tablet Take 1,000 mg by mouth every 6 (six) hours as needed. For pain    . acyclovir (ZOVIRAX) 200 MG capsule Take 200 mg by mouth 2 (two) times daily. Reported on 07/23/2015    . albuterol (PROVENTIL HFA;VENTOLIN HFA) 108 (90 Base) MCG/ACT inhaler Inhale into the lungs every 6 (six) hours as needed for wheezing or shortness of breath.    . APRISO 0.375 g 24 hr capsule TAKE FOUR (4) CAPSULES BY MOUTH EVERY MORNING 120 capsule 5  . clotrimazole-betamethasone (LOTRISONE) cream Apply 1 application topically 2 (two) times daily. Apply to groin 30 g 0  . cyclobenzaprine (FLEXERIL) 5 MG tablet Take 1 tablet (5 mg total) by mouth 3 (three) times daily as needed for muscle spasms. 60 tablet 1  .  dexamethasone (DECADRON) 4 MG tablet Take 2 tablets (8 mg total) by mouth 2 (two) times daily. Start the day before Taxotere. Then daily after chemo for 2 days. 30 tablet 1  . DOCEtaxel (TAXOTERE IV) Inject into the vein. Every 3 weeks    . EPINEPHrine (EPIPEN) 0.3 mg/0.3 mL SOAJ injection Inject into the muscle as needed.    [name] FLUoxetine (PROZAC) 40 MG capsule Take 1 capsule (40 mg total) by mouth daily. 30 capsule 3  . gabapentin (NEURONTIN) 100 MG capsule Take 100 mg  by mouth 3 (three) times daily.     [name] HYDROcodone-acetaminophen (NORCO/VICODIN) 5-325 MG tablet Take 1 tablet by mouth every 4 (four) hours as needed for moderate pain. 30 tablet 0  . ibuprofen (ADVIL,MOTRIN) 200 MG tablet Take 400 mg by mouth every 6 (six) hours as needed for moderate pain.    [name] insulin glargine (LANTUS) 100 UNIT/ML injection Inject 20 Units into the skin at bedtime.     . insulin lispro protamine-insulin lispro (HUMALOG 50/50) (50-50) 100 UNIT/ML SUSP Inject 4 Units into the skin 4 (four) times daily. With each meal    . iron polysaccharides (NIFEREX) 150 MG capsule Take 150 mg by mouth daily.    [name] lidocaine-prilocaine (EMLA) cream Apply a quarter size amount to port site 1 hour prior to chemo. Do not rub in. Cover with plastic wrap. 30 g 3  . loratadine (CLARITIN) 10 MG tablet TAKE ONE TABLET BY MOUTH DAILY FOR HIVES.  6  . LORazepam (ATIVAN) 0.5 MG tablet Take 1 tablet (0.5 mg total) by mouth every 8 (eight) hours. For nausea or anxiety 45 tablet 0  . Melatonin 5 MG TABS Take 5 mg by mouth at bedtime.    . mometasone (NASONEX) 50 MCG/ACT nasal spray Place 2 sprays into the nose daily as needed.     . mupirocin ointment (BACTROBAN) 2 % Place 1 application into the nose 2 (two) times daily. (Patient taking differently: Place 1 application into the nose 2 (two) times daily as needed (irritation). ) 22 g 0  . omeprazole (PRILOSEC) 20 MG capsule Take 20 mg by mouth 2 (two) times daily before a meal.     . ondansetron  (ZOFRAN ODT) 8 MG disintegrating tablet Take 1 tablet (8 mg total) by mouth every 8 (eight) hours as needed for nausea or vomiting. 30 tablet 2  . Pegfilgrastim (NEULASTA ONPRO [place]) Inject into the skin. Every 21 days    . polyethylene glycol powder (GLYCOLAX/MIRALAX) powder Take 8.5 g by mouth daily. (Patient taking differently: Take 8.5 g by mouth daily as needed for mild constipation. ) 255 g 5  . potassium chloride SA (K-DUR,KLOR-CON) 20 MEQ tablet Take 1 tablet (20 mEq total) by mouth daily. 30 tablet 2  . promethazine (PHENERGAN) 25 MG tablet Take 1 tablet (25 mg total) by mouth every 6 (six) hours as needed for nausea or vomiting. 30 tablet 2   No current facility-administered medications for this visit.    Facility-Administered Medications Ordered in Other Visits  Medication Dose Route Frequency Provider Last Rate Last Dose  . heparin lock flush 100 unit/mL  500 Units Intracatheter Once PRN Patrici Ranks, MD      . sodium chloride flush (NS) 0.9 % injection 10 mL  10 mL Intracatheter PRN Patrici Ranks, MD   10 mL at 10/01/15 0900    Review of Systems  Constitutional: Negative.  Negative for chills, fever, malaise/fatigue and weight loss.  HENT: Negative.  Negative for congestion, hearing loss, nosebleeds, sore throat and tinnitus.   Eyes: Negative.  Negative for blurred vision, double vision, pain and discharge.  Respiratory: Negative.  Negative for cough, hemoptysis, sputum production, shortness of breath and wheezing.   Cardiovascular: Negative.  Negative for chest pain, palpitations, claudication, leg swelling and PND.  Gastrointestinal: Negative.  Negative for abdominal pain, blood in stool, constipation, diarrhea, heartburn, melena, nausea and vomiting.  Genitourinary: Negative.  Negative for dysuria, frequency, hematuria and urgency.  Musculoskeletal: Positive for back pain and joint pain.  Negative for falls and myalgias.  Skin: Negative.  Negative for itching and rash.    Neurological: Negative.  Negative for dizziness, tingling, tremors, sensory change, speech change, focal weakness, seizures, loss of consciousness, weakness and headaches.  Endo/Heme/Allergies: Negative.  Does not bruise/bleed easily.  Psychiatric/Behavioral: Negative.  Negative for depression, memory loss, substance abuse and suicidal ideas. The patient is not nervous/anxious and does not have insomnia.   All other systems reviewed and are negative.  14 point ROS was done and is otherwise as detailed above or in HPI   PHYSICAL EXAMINATION: ECOG PERFORMANCE STATUS: 1 - Symptomatic but completely ambulatory  Vitals:   10/01/15 0908  BP: (!) 144/80  Pulse: 82  Resp: 16  Temp: 98.5 F (36.9 C)   Filed Weights   10/01/15 0908  Weight: 165 lb 8 oz (75.1 kg)    Physical Exam  Constitutional: She is oriented to person, place, and time and well-developed, well-nourished, and in no distress.  alopecia  HENT:  Head: Normocephalic and atraumatic.  Nose: Nose normal.  Mouth/Throat: Oropharynx is clear and moist. No oropharyngeal exudate.  Eyes: Conjunctivae and EOM are normal. Pupils are equal, round, and reactive to light. Right eye exhibits no discharge. Left eye exhibits no discharge. No scleral icterus.  Neck: Normal range of motion. Neck supple. No tracheal deviation present. No thyromegaly present.  Cardiovascular: Normal rate, regular rhythm and normal heart sounds.  Exam reveals no gallop and no friction rub.   No murmur heard. Pulmonary/Chest: Effort normal and breath sounds normal. She has no wheezes. She has no rales.  Abdominal: Soft. Bowel sounds are normal. She exhibits no distension and no mass. There is no tenderness. There is no rebound and no guarding.  Musculoskeletal: Normal range of motion. She exhibits no edema.  Lymphadenopathy:    She has no cervical adenopathy.  Neurological: She is alert and oriented to person, place, and time. She has normal reflexes. No  cranial nerve deficit. Gait normal. Coordination normal.  Skin: Skin is warm and dry. No rash noted.  Bilateral axillary regions with small superficial comedones  Psychiatric: Mood, memory, affect and judgment normal.  Nursing note and vitals reviewed.    LABORATORY DATA: I have reviewed the data as listed. Results for WILENE, PHARO (MRN [id number]) as of 10/01/2015 14:57  Ref. Range 10/01/2015 09:35  Sodium Latest Ref Range: 135 - 145 mmol/L 134 (L)  Potassium Latest Ref Range: 3.5 - 5.1 mmol/L 4.5  Chloride Latest Ref Range: 101 - 111 mmol/L 99 (L)  CO2 Latest Ref Range: 22 - 32 mmol/L 26  BUN Latest Ref Range: 6 - 20 mg/dL 20  Creatinine Latest Ref Range: 0.44 - 1.00 mg/dL 0.66  Calcium Latest Ref Range: 8.9 - 10.3 mg/dL 10.0  EGFR (Non-African Amer.) Latest Ref Range: >60 mL/min >60  EGFR (African American) Latest Ref Range: >60 mL/min >60  Glucose Latest Ref Range: 65 - 99 mg/dL 424 (H)  Anion gap Latest Ref Range: 5 - 15  9  Alkaline Phosphatase Latest Ref Range: 38 - 126 U/L 87  Albumin Latest Ref Range: 3.5 - 5.0 g/dL 4.0  AST Latest Ref Range: 15 - 41 U/L 20  ALT Latest Ref Range: 14 - 54 U/L 16  Total Protein Latest Ref Range: 6.5 - 8.1 g/dL 7.7  Total Bilirubin Latest Ref Range: 0.3 - 1.2 mg/dL 1.0  WBC Latest Ref Range: 4.0 - 10.5 K/uL 7.1  RBC Latest Ref Range: 3.87 - 5.11 MIL/uL 3.50 (L)  Hemoglobin Latest Ref Range: 12.0 - 15.0 g/dL 9.2 (L)  HCT Latest Ref Range: 36.0 - 46.0 % 29.1 (L)  MCV Latest Ref Range: 78.0 - 100.0 fL 83.1  MCH Latest Ref Range: 26.0 - 34.0 pg 26.3  MCHC Latest Ref Range: 30.0 - 36.0 g/dL 31.6  RDW Latest Ref Range: 11.5 - 15.5 % 18.2 (H)  Platelets Latest Ref Range: 150 - 400 K/uL 295  Neutrophils Latest Units: % 88  Lymphocytes Latest Units: % 10  Monocytes Relative Latest Units: % 2  Eosinophil Latest Units: % 0  Basophil Latest Units: % 0  NEUT# Latest Ref Range: 1.7 - 7.7 K/uL 6.2  Lymphocyte # Latest Ref Range: 0.7 - 4.0 K/uL  0.7  Monocyte # Latest Ref Range: 0.1 - 1.0 K/uL 0.1  Eosinophils Absolute Latest Ref Range: 0.0 - 0.7 K/uL 0.0  Basophils Absolute Latest Ref Range: 0.0 - 0.1 K/uL 0.0     RADIOGRAPHY: I have reviewed the data as listed below.  Study Result   CLINICAL DATA:  Right-sided breast cancer.  EXAM: NUCLEAR MEDICINE CARDIAC BLOOD POOL IMAGING (MUGA)  TECHNIQUE: Cardiac multi-gated acquisition was performed at rest following intravenous injection of Tc-88mlabeled red blood cells.  RADIOPHARMACEUTICALS:  25.0 mCi Tc-935mDP in-vitro labeled red blood cells IV  COMPARISON:  None.  FINDINGS: The left ventricular ejection fraction is equal to 62.9%. On 10/11/2012 this equaled 57%. Normal left ventricular wall motion.  IMPRESSION: 1. Normal left ventricular ejection fraction equal to 62.9%.   Electronically Signed   By: TaKerby Moors.D.   On: 06/17/2015 15:19    PATHOLOGY:     ASSESSMENT & PLAN:  pT1cN0 triple negative carcinoma of the R breast S/P lumpectomy and sentinel LN procedure Iron deficiency Anemia Ulcerative Colitis followed by Dr. ReLaural Goldenngoing Menses Negative Genetic [facility] Taxol induced musculoskeletal pain Hidradenitis Chemotherapy induced anemia Depression Steroid induced hyperglycemia  She had a hospital admission secondary to neutropenia fever after 2 weekly cycles of taxol. She was profoundly neutropenic. She had a lot of difficulty tolerating the musculoskeletal pain associated with taxol as well. We discussed multiple options at her last visit and have opted to change to taxotere either dose dense or q 3 weeks with growth factor support.   I will set Brittney [name] up for fluids next week. I explained to the patient that if she does not feel she needs fluids, she can call in and cancel.   I encouraged her to join a support group and she appears willing to do so. I will arrange for her to meet GrAbby Potashagain.  I have increased her prozac to 40 mg daily. She is doing ok with this.   She has diabetes and the decadron has driven her blood sugars up. We will give her novolog today in the clinic. I have encouraged her to keep a log of her blood sugars. I will consider a SS if needed.   Follow up with D[facility] on 10/22/2015.  All questions were answered. The patient knows to call the clinic with any problems, questions or concerns.  This document serves as a record of services personally performed by ShAncil LinseyMD. It was created on her behalf by DaElmyra Ricksa trained medical scribe. The creation of this record is based on the scribe's personal observations and the provider's statements to them. This document has been checked and approved by the attending provider.  I have reviewed the above documentation for accuracy and  completeness and I agree with the above.  This note was electronically signed.  Molli Hazard, MD  10/01/2015 3:09 PM

## 2015-10-02 ENCOUNTER — Encounter (HOSPITAL_COMMUNITY): Payer: Self-pay | Admitting: Hematology & Oncology

## 2015-10-05 ENCOUNTER — Encounter (HOSPITAL_COMMUNITY): Payer: Medicaid Other | Attending: Adult Health

## 2015-10-05 VITALS — BP 118/60 | HR 87 | Temp 99.1°F | Resp 16

## 2015-10-05 DIAGNOSIS — N179 Acute kidney failure, unspecified: Secondary | ICD-10-CM

## 2015-10-05 DIAGNOSIS — D500 Iron deficiency anemia secondary to blood loss (chronic): Secondary | ICD-10-CM | POA: Insufficient documentation

## 2015-10-05 DIAGNOSIS — C50411 Malignant neoplasm of upper-outer quadrant of right female breast: Secondary | ICD-10-CM

## 2015-10-05 DIAGNOSIS — Z9889 Other specified postprocedural states: Secondary | ICD-10-CM | POA: Insufficient documentation

## 2015-10-05 DIAGNOSIS — Z5111 Encounter for antineoplastic chemotherapy: Secondary | ICD-10-CM | POA: Insufficient documentation

## 2015-10-05 DIAGNOSIS — Z808 Family history of malignant neoplasm of other organs or systems: Secondary | ICD-10-CM | POA: Insufficient documentation

## 2015-10-05 DIAGNOSIS — E108 Type 1 diabetes mellitus with unspecified complications: Secondary | ICD-10-CM | POA: Insufficient documentation

## 2015-10-05 DIAGNOSIS — K219 Gastro-esophageal reflux disease without esophagitis: Secondary | ICD-10-CM | POA: Insufficient documentation

## 2015-10-05 MED ORDER — SODIUM CHLORIDE 0.9% FLUSH
10.0000 mL | INTRAVENOUS | Status: DC | PRN
  Administered 2015-10-05: 10 mL via INTRAVENOUS
  Filled 2015-10-05: qty 10

## 2015-10-05 MED ORDER — SODIUM CHLORIDE 0.9 % IV SOLN
Freq: Once | INTRAVENOUS | Status: AC
  Administered 2015-10-05: 12:00:00 via INTRAVENOUS

## 2015-10-05 MED ORDER — HEPARIN SOD (PORK) LOCK FLUSH 100 UNIT/ML IV SOLN
500.0000 [IU] | Freq: Once | INTRAVENOUS | Status: AC
  Administered 2015-10-05: 500 [IU] via INTRAVENOUS

## 2015-10-05 NOTE — Progress Notes (Signed)
Brittney [name] tolerated hydration well without complaints. Pt did report that over the weekend she  experienced some increased pain that radiated down under her left ear. She said in the past she had this same problem and went to the ER where they diagnosed her with Trigeminal Neuralgia. She states that the pain comes and goes but over the weekend it became worse and she took her pain medication which was effective. Temp 99.1 on discharge. Pt reminded to keep a check on her temperature and call if she develops a fever. Pt verbalized understanding. Port flushed per protocol. Pt discharged self ambulatory in satisfactory condition

## 2015-10-05 NOTE — Patient Instructions (Signed)
[place] at [facility] Discharge Instructions  RECOMMENDATIONS MADE BY THE CONSULTANT AND ANY TEST RESULTS WILL BE SENT TO YOUR REFERRING PHYSICIAN.  Received hydration with Normal Saline today. Follow-up as scheduled. Call clinic for any questions or concerns  Thank you for choosing [place] at [facility] to provide your oncology and hematology care.  To afford each patient quality time with our provider, please arrive at least 15 minutes before your scheduled appointment time.   Beginning January 23rd 2017 lab work for the [facility] will be done in the  Main lab at [facility] on 1st floor. If you have a lab appointment with the [facility] please come in thru the  Main Entrance and check in at the main information desk  You need to re-schedule your appointment should you arrive 10 or more minutes late.  We strive to give you quality time with our providers, and arriving late affects you and other patients whose appointments are after yours.  Also, if you no show three or more times for appointments you may be dismissed from the clinic at the providers discretion.     Again, thank you for choosing [facility].  Our hope is that these requests will decrease the amount of time that you wait before being seen by our physicians.       _____________________________________________________________  Should you have questions after your visit to [facility], please contact our office at [phone] between the hours of 8:30 a.m. and 4:30 p.m.  Voicemails left after 4:30 p.m. will not be returned until the following business day.  For prescription refill requests, have your pharmacy contact our office.         Resources For Cancer Patients and their Caregivers ? American Cancer Society: Can assist with transportation, wigs, general needs, runs Look Good Feel Better.        646-316-7512 ? Cancer  Care: Provides financial assistance, online support groups, medication/co-pay assistance.  1-800-813-HOPE 561-578-6737) ? [facility] Assists [place] cancer patients and their families through emotional , educational and financial support.  ([phone] ? [place] DSS Where to apply for food stamps, Medicaid and utility assistance. 913-088-3853 ? RCATS: Transportation to medical appointments. 336-832-5588 ? Social Security Administration: May apply for disability if have a Stage IV cancer. [phone] [phone]) [phone] ? [employer], Disability and Transit Services: Assists with nutrition, care and transit needs. [place] Support Programs: @10RELATIVEDAYS @ > Cancer Support Group  2nd Tuesday of the month 1pm-2pm, Journey Room  > Creative Journey  3rd Tuesday of the month 1130am-1pm, Journey Room  > Look Good Feel Better  1st Wednesday of the month 10am-12 noon, Journey Room (Call [facility] to register [phone])

## 2015-10-07 ENCOUNTER — Ambulatory Visit (HOSPITAL_COMMUNITY): Payer: Medicaid Other

## 2015-10-09 ENCOUNTER — Other Ambulatory Visit (HOSPITAL_COMMUNITY): Payer: Medicaid Other

## 2015-10-09 ENCOUNTER — Ambulatory Visit (HOSPITAL_COMMUNITY): Payer: Medicaid Other | Admitting: Hematology & Oncology

## 2015-10-12 ENCOUNTER — Telehealth (HOSPITAL_COMMUNITY): Payer: Self-pay

## 2015-10-12 ENCOUNTER — Ambulatory Visit (HOSPITAL_COMMUNITY): Payer: Medicaid Other

## 2015-10-12 NOTE — Telephone Encounter (Signed)
Spoke with patient who states her blood sugars are still running in the 200-300's especially the 0800 and 1700 accuchecks. Reviewed with oncologist. She wanted patient to know how much dexamethasone she took with each treatment cycle and to contact her PCP who monitors her diabetes and insulin. Patient verbalized understanding.

## 2015-10-12 NOTE — Telephone Encounter (Signed)
-----   Message from Louis Meckel sent at 10/12/2015  8:51 AM EDT ----- Regarding: sugat level Patient called and states that her sugar level is still running high and some lows.  She wants to speak to a nurse about it.  Thanks

## 2015-10-14 ENCOUNTER — Telehealth (HOSPITAL_COMMUNITY): Payer: Self-pay | Admitting: Hematology & Oncology

## 2015-10-14 NOTE — Telephone Encounter (Signed)
PC TO PT ADVISING HER OF OUR BREAST CANCER FUNDS THAT ARE AVAILABLE TO HER. I ASKED IF SHE NEEDED A BILL PAID GAS FOOD ETC. SHE STATED HER LIGHT BILL BUT AT THIS TIME SHE DOES NOT KNOW THE EXACT BAL BUT SHE WILL GET BACK TO ME WITH IT. I ADVISED HER THAT IF ITS UNDER $500 MAYBE WE COULD ALSO ASSIST HER WITH A GAS CARD. PT WILL BRING THE INFO TO ME SOON

## 2015-10-16 ENCOUNTER — Telehealth (HOSPITAL_COMMUNITY): Payer: Self-pay | Admitting: Oncology

## 2015-10-16 NOTE — Telephone Encounter (Signed)
Peer to peer completed for CT of head.  This is approved following my conversation with the physician.  Approval # is [id number]  Doy Mince 10/16/2015 3:49 PM

## 2015-10-21 NOTE — Progress Notes (Signed)
[place]  PROGRESS NOTE  Patient Care Team: [name], MD as PCP - General (Unknown Physician Specialty)  CHIEF COMPLAINTS/PURPOSE OF CONSULTATION:     Breast cancer of upper-outer quadrant of right female breast ([place])   04/29/2015 Initial Biopsy    upper outer quadrant mass R breast 2.9 cm by ultrasound, high grade invasive ductal carcinoma ER< 1%, PR 1%, HER -2 not amplified, Ki-67 66%      05/13/2015 Cancer Staging    pT1cN0      05/13/2015 Surgery    lumpectomy and sentinel node biopsies X 3, 2 cm tumor, 3 negative nodes       05/21/2015 Imaging    CT C/A/P [facility] with postsurgical changes related to R breast lympectomy and R axillary LN dissection, no findings suspicious for metastatic disease      06/12/2015 Procedure    Port placement by Dr. Anthony Sar      06/17/2015 Imaging    MUGA- Normal left ventricular ejection fraction equal to 62.9%.      06/25/2015 - 08/13/2015 Chemotherapy    AC x 4      08/27/2015 - 09/03/2015 Chemotherapy    Weekly Taxol      09/11/2015 - 09/15/2015 Hospital Admission    Neutropenic fever, ARF secondary to antibiotics      10/01/2015 -  Chemotherapy    Taxotere 75 mg/m2 Q3 weeks         HISTORY OF PRESENTING ILLNESS:  Brittney [name] 45 y.o. 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 Brittney [name] is here today for her next cycle of Taxotere. Brittney [name] is actually doing well.  Brittney [name] notes improvement in her mood with increase in her prozac dose.  Brittney [name] still experienced some musculoskeletal pain after her treatment but notes that it was not as bad as with the taxol.  Brittney [name] has fear about going back to work because Brittney [name] knows Brittney [name] will be unable to work full time. Brittney [name] is afraid to take on new clients as Brittney [name] will be unable to "follow through."  This frustrates her. Brittney [name] does find relief in that Brittney [name] is almost completed with chemotherapy.   Currently, no fever,  no nausea or vomiting. Taste is off, but Brittney [name] eats. No diarrhea or constipation.      MEDICAL HISTORY:  Past Medical History:  Diagnosis Date  . Breast cancer ([place])   . Breast cancer of upper-outer quadrant of right female breast ([place]) 06/11/2015   Triple negative, 2 cm   . Chemotherapy induced neutropenia ([place]) 09/10/2015  . Diabetes mellitus ([place])    Type 1 over 15 yrs  . Environmental allergies   . GERD (gastroesophageal reflux disease)   . Iron deficiency anemia due to chronic blood loss 06/12/2015  . 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 CESAREAN SECTION    . COLONOSCOPY N/A 10/09/2013   Procedure: COLONOSCOPY;  Surgeon: Rogene Houston, MD;  Location: [facility] SU[facility];  Service: Endoscopy;  Laterality: N/A;  100  . DILATION AND CURETTAGE OF UTERUS     x 2 for miscarriages  . ESOPHAGOGASTRODUODENOSCOPY    . FLEXIBLE SIGMOIDOSCOPY    . FLEXIBLE SIGMOIDOSCOPY  07/27/2011   Procedure: FLEXIBLE SIGMOIDOSCOPY;  Surgeon: Rogene Houston, MD;  Location: [facility];  Service: Endoscopy;  Laterality: N/A;  100  SOCIAL HISTORY: Social History   Social History  . Marital status: Single    Spouse name: N/A  . Number of children: N/A  . Years of education: N/A   Occupational History  . Not on file.   Social History Main Topics  . Smoking status: Never Smoker  . Smokeless tobacco: Never Used  . Alcohol use No  . Drug use: No  . Sexual activity: Not on file   Other Topics Concern  . Not on file   Social History Narrative  . No narrative on file   Brittney [name] is single. Has a 67 year old daughter. Aged 67. Works in home health care. Also going to school. Has to sit out her very last term in order to do her chemotherapy. Working toward RNA/CNA.  Brittney [name] does not smoke.  Brittney [name] loves to read, spend time with her family. Brittney [name] used to like to shop but doesnm't have a lot of money  to spend now, so it's at the bottom of the list.   FAMILY HISTORY: Family History  Problem Relation Age of Onset  . Non-Hodgkin's lymphoma Mother   . Hypertension Mother   . Diabetes Mellitus II Mother   . Hypertension Father   . Diabetes Mellitus II Father   . Colon cancer Cousin   . Breast cancer Maternal Aunt    Mom diagnosed in 2013 with non-hodgkin's lymphoma. Had chemotherapy, radiation, more chemotherapy, and stem-cell at [facility] using her cells. Her mother has been in remission since. Mother is aged 31 this month (June 20th, 2017). Father is 65 as well; has high blood pressure. PTSD. Has been treated at the [facility] in [place]. Brittney [name] believes he has high cholesterol too, like her mother. Otherwise, he is "alright I guess."  Has an older brother, and a younger sister. No one else in her immediate family with cancer.   ALLERGIES:  is allergic to ace inhibitors; adhesive [tape]; pravastatin sodium; statins; peanuts [peanut oil]; and shellfish allergy.  MEDICATIONS:  Current Outpatient Prescriptions  Medication Sig Dispense Refill  . acetaminophen (TYLENOL) 500 MG tablet Take 1,000 mg by mouth every 6 (six) hours as needed. For pain    . acyclovir (ZOVIRAX) 200 MG capsule Take 200 mg by mouth 2 (two) times daily. Reported on 07/23/2015    . albuterol (PROVENTIL HFA;VENTOLIN HFA) 108 (90 Base) MCG/ACT inhaler Inhale into the lungs every 6 (six) hours as needed for wheezing or shortness of breath.    . APRISO 0.375 g 24 hr capsule TAKE FOUR (4) CAPSULES BY MOUTH EVERY MORNING 120 capsule 5  . clotrimazole-betamethasone (LOTRISONE) cream Apply 1 application topically 2 (two) times daily. Apply to groin 30 g 0  . cyclobenzaprine (FLEXERIL) 5 MG tablet Take 1 tablet (5 mg total) by mouth 3 (three) times daily as needed for muscle spasms. 60 tablet 1  . dexamethasone (DECADRON) 4 MG tablet Take 2 tablets (8 mg total) by mouth 2 (two) times daily. Start the day before Taxotere. Then  daily after chemo for 2 days. 30 tablet 1  . DOCEtaxel (TAXOTERE IV) Inject into the vein. Every 3 weeks    . EPINEPHrine (EPIPEN) 0.3 mg/0.3 mL SOAJ injection Inject into the muscle as needed.    [name] FLUoxetine (PROZAC) 40 MG capsule Take 1 capsule (40 mg total) by mouth daily. 30 capsule 3  . gabapentin (NEURONTIN) 100 MG capsule Take 100 mg by mouth 3 (three) times daily.     [name] HYDROcodone-acetaminophen (NORCO/VICODIN) 5-325 MG tablet Take  1 tablet by mouth every 4 (four) hours as needed for moderate pain. 30 tablet 0  . ibuprofen (ADVIL,MOTRIN) 200 MG tablet Take 400 mg by mouth every 6 (six) hours as needed for moderate pain.    [name] insulin glargine (LANTUS) 100 UNIT/ML injection Inject 20 Units into the skin at bedtime.     . insulin lispro protamine-insulin lispro (HUMALOG 50/50) (50-50) 100 UNIT/ML SUSP Inject 4 Units into the skin 4 (four) times daily. With each meal    . iron polysaccharides (NIFEREX) 150 MG capsule Take 150 mg by mouth daily.    [name] lidocaine-prilocaine (EMLA) cream Apply a quarter size amount to port site 1 hour prior to chemo. Do not rub in. Cover with plastic wrap. 30 g 3  . loratadine (CLARITIN) 10 MG tablet TAKE ONE TABLET BY MOUTH DAILY FOR HIVES.  6  . LORazepam (ATIVAN) 0.5 MG tablet Take 1 tablet (0.5 mg total) by mouth every 8 (eight) hours. For nausea or anxiety 45 tablet 0  . Melatonin 5 MG TABS Take 5 mg by mouth at bedtime.    . mometasone (NASONEX) 50 MCG/ACT nasal spray Place 2 sprays into the nose daily as needed.     . mupirocin ointment (BACTROBAN) 2 % Place 1 application into the nose 2 (two) times daily. (Patient taking differently: Place 1 application into the nose 2 (two) times daily as needed (irritation). ) 22 g 0  . omeprazole (PRILOSEC) 20 MG capsule Take 20 mg by mouth 2 (two) times daily before a meal.     . ondansetron (ZOFRAN ODT) 8 MG disintegrating tablet Take 1 tablet (8 mg total) by mouth every 8 (eight) hours as needed for nausea or vomiting.  30 tablet 2  . Pegfilgrastim (NEULASTA ONPRO [address]) Inject into the skin. Every 21 days    . polyethylene glycol powder (GLYCOLAX/MIRALAX) powder Take 8.5 g by mouth daily. (Patient taking differently: Take 8.5 g by mouth daily as needed for mild constipation. ) 255 g 5  . potassium chloride SA (K-DUR,KLOR-CON) 20 MEQ tablet Take 1 tablet (20 mEq total) by mouth daily. 30 tablet 2  . promethazine (PHENERGAN) 25 MG tablet Take 1 tablet (25 mg total) by mouth every 6 (six) hours as needed for nausea or vomiting. 30 tablet 2   No current facility-administered medications for this visit.    Facility-Administered Medications Ordered in Other Visits  Medication Dose Route Frequency Provider Last Rate Last Dose  . 0.9 %  sodium chloride infusion   Intravenous Continuous Baird Cancer, PA-C        Review of Systems  Constitutional: Negative.  Negative for chills, fever, malaise/fatigue and weight loss.  HENT: Negative.  Negative for congestion, hearing loss, nosebleeds, sore throat and tinnitus.   Eyes: Negative.  Negative for blurred vision, double vision, pain and discharge.  Respiratory: Negative.  Negative for cough, hemoptysis, sputum production, shortness of breath and wheezing.   Cardiovascular: Negative.  Negative for chest pain, palpitations, claudication, leg swelling and PND.  Gastrointestinal: Negative.  Negative for abdominal pain, blood in stool, constipation, diarrhea, heartburn, melena, nausea and vomiting.  Genitourinary: Negative.  Negative for dysuria, frequency, hematuria and urgency.  Musculoskeletal: Positive for back pain and joint pain. Negative for falls and myalgias.  Skin: Negative.  Negative for itching and rash.  Neurological: Negative.  Negative for dizziness, tingling, tremors, sensory change, speech change, focal weakness, seizures, loss of consciousness, weakness and headaches.  Endo/Heme/Allergies: Negative.  Does not bruise/bleed easily.  Psychiatric/Behavioral:  Negative.  Negative for depression, memory loss, substance abuse and suicidal ideas. The patient is not nervous/anxious and does not have insomnia.   All other systems reviewed and are negative.  14 point ROS was done and is otherwise as detailed above or in HPI   PHYSICAL EXAMINATION: ECOG PERFORMANCE STATUS: 1 - Symptomatic but completely ambulatory  Vitals:   10/22/15 1058  BP: 132/76  Pulse: 84  Resp: 16  Temp: 98.8 F (37.1 C)   Filed Weights   10/22/15 1058  Weight: 166 lb 3.2 oz (75.4 kg)    Physical Exam  Constitutional: Brittney [name] is oriented to person, place, and time and well-developed, well-nourished, and in no distress.  alopecia  HENT:  Head: Normocephalic and atraumatic.  Nose: Nose normal.  Mouth/Throat: Oropharynx is clear and moist. No oropharyngeal exudate.  Eyes: Conjunctivae and EOM are normal. Pupils are equal, round, and reactive to light. Right eye exhibits no discharge. Left eye exhibits no discharge. No scleral icterus.  Neck: Normal range of motion. Neck supple. No tracheal deviation present. No thyromegaly present.  Cardiovascular: Normal rate, regular rhythm and normal heart sounds.  Exam reveals no gallop and no friction rub.   No murmur heard. Pulmonary/Chest: Effort normal and breath sounds normal. Brittney [name] has no wheezes. Brittney [name] has no rales.  Abdominal: Soft. Bowel sounds are normal. Brittney [name] exhibits no distension and no mass. There is no tenderness. There is no rebound and no guarding.  Musculoskeletal: Normal range of motion. Brittney [name] exhibits no edema.  Lymphadenopathy:    Brittney [name] has no cervical adenopathy.  Neurological: Brittney [name] is alert and oriented to person, place, and time. Brittney [name] has normal reflexes. No cranial nerve deficit. Gait normal. Coordination normal.  Skin: Skin is warm and dry. No rash noted.  Bilateral axillary regions with small superficial comedones  Psychiatric: Mood, memory, affect and judgment normal.  Nursing note and vitals  reviewed.    LABORATORY DATA: I have reviewed the data as listed. Results for UBAH, RADKE (MRN [id number]) as of 10/23/2015 19:01  Ref. Range 10/22/2015 11:25  Sodium Latest Ref Range: 135 - 145 mmol/L 133 (L)  Potassium Latest Ref Range: 3.5 - 5.1 mmol/L 3.7  Chloride Latest Ref Range: 101 - 111 mmol/L 99 (L)  CO2 Latest Ref Range: 22 - 32 mmol/L 25  BUN Latest Ref Range: 6 - 20 mg/dL 16  Creatinine Latest Ref Range: 0.44 - 1.00 mg/dL 0.65  Calcium Latest Ref Range: 8.9 - 10.3 mg/dL 9.4  EGFR (Non-African Amer.) Latest Ref Range: >60 mL/min >60  EGFR (African American) Latest Ref Range: >60 mL/min >60  Glucose Latest Ref Range: 65 - 99 mg/dL 356 (H)  Anion gap Latest Ref Range: 5 - 15  9  Alkaline Phosphatase Latest Ref Range: 38 - 126 U/L 80  Albumin Latest Ref Range: 3.5 - 5.0 g/dL 3.9  AST Latest Ref Range: 15 - 41 U/L 22  ALT Latest Ref Range: 14 - 54 U/L 17  Total Protein Latest Ref Range: 6.5 - 8.1 g/dL 6.8  Total Bilirubin Latest Ref Range: 0.3 - 1.2 mg/dL 1.1  WBC Latest Ref Range: 4.0 - 10.5 K/uL 10.6 (H)  RBC Latest Ref Range: 3.87 - 5.11 MIL/uL 3.44 (L)  Hemoglobin Latest Ref Range: 12.0 - 15.0 g/dL 9.3 (L)  HCT Latest Ref Range: 36.0 - 46.0 % 29.1 (L)  MCV Latest Ref Range: 78.0 - 100.0 fL 84.6  MCH Latest Ref Range: 26.0 - 34.0 pg 27.0  MCHC Latest Ref  Range: 30.0 - 36.0 g/dL 32.0  RDW Latest Ref Range: 11.5 - 15.5 % 17.2 (H)  Platelets Latest Ref Range: 150 - 400 K/uL 306  Neutrophils Latest Units: % 85  Lymphocytes Latest Units: % 9  Monocytes Relative Latest Units: % 6  Eosinophil Latest Units: % 0  Basophil Latest Units: % 0  NEUT# Latest Ref Range: 1.7 - 7.7 K/uL 9.1 (H)  Lymphocyte # Latest Ref Range: 0.7 - 4.0 K/uL 1.0  Monocyte # Latest Ref Range: 0.1 - 1.0 K/uL 0.6  Eosinophils Absolute Latest Ref Range: 0.0 - 0.7 K/uL 0.0  Basophils Absolute Latest Ref Range: 0.0 - 0.1 K/uL 0.0   RADIOGRAPHY: I have reviewed the data as listed below. Study  Result   CLINICAL DATA:  Fever and chills for 3 days  EXAM: CHEST  2 VIEW  COMPARISON:  6/9/ 17  FINDINGS: The heart size and mediastinal contours are within normal limits. Both lungs are clear. Mild degenerative changes thoracic spine. Left subclavian Port-A-Cath with tip in SVC. No pneumothorax.  IMPRESSION: No active cardiopulmonary disease.   Electronically Signed   By: Lahoma Crocker M.D.   On: 09/11/2015 14:02     PATHOLOGY:   ASSESSMENT & PLAN:  pT1cN0 triple negative carcinoma of the R breast S/P lumpectomy and sentinel LN procedure Iron deficiency Anemia Ulcerative Colitis followed by Dr. Laural Golden Ongoing Menses Negative Genetic Testing [facility] Chemotherapy induced neutropenia Taxol induced musculoskeletal pain Hidradenitis Chemotherapy induced anemia Depression Steroid induced hyperglycemia Type one diabetes  Brittney [name] is doing better. Mood is improved on increased prozac. Brittney [name] will continue. Labs are reviewed with the patient in detail. Results are noted above. Brittney [name] is going to proceed with Taxotere today.   Brittney [name] has an insulin pump and will self administer insulin for her hyperglycemia.   Brittney [name] does not need any refills today.   I have arranged for IV fluids next week per the patient's request.   I have again encouraged her to consider support groups or reach out to [place] or [place] with any concerns or problems.  RTC in 3 weeks.  Orders Placed This Encounter  Procedures  . CBC with Differential    Standing Status:   Future    Standing Expiration Date:   10/21/2016  . Comprehensive metabolic panel    Standing Status:   Future    Standing Expiration Date:   10/21/2016    All questions were answered. The patient knows to call the clinic with any problems, questions or concerns.  This document serves as a record of services personally performed by Ancil Linsey, MD. It was created on her behalf by Arlyce Harman, a trained medical scribe. The  creation of this record is based on the scribe's personal observations and the provider's statements to them. This document has been checked and approved by the attending provider.  I have reviewed the above documentation for accuracy and completeness and I agree with the above.  This note was electronically signed.  Molli Hazard, MD  10/23/2015 7:00 PM

## 2015-10-22 ENCOUNTER — Encounter (HOSPITAL_BASED_OUTPATIENT_CLINIC_OR_DEPARTMENT_OTHER): Payer: Medicaid Other | Admitting: Hematology & Oncology

## 2015-10-22 ENCOUNTER — Encounter (HOSPITAL_COMMUNITY): Payer: Self-pay | Admitting: Hematology & Oncology

## 2015-10-22 ENCOUNTER — Encounter (HOSPITAL_COMMUNITY): Payer: Medicaid Other | Attending: Hematology & Oncology

## 2015-10-22 VITALS — BP 132/76 | HR 84 | Temp 98.8°F | Resp 16 | Wt 166.2 lb

## 2015-10-22 VITALS — BP 124/65 | HR 84 | Temp 98.2°F | Resp 18

## 2015-10-22 DIAGNOSIS — D701 Agranulocytosis secondary to cancer chemotherapy: Secondary | ICD-10-CM

## 2015-10-22 DIAGNOSIS — Z171 Estrogen receptor negative status [ER-]: Secondary | ICD-10-CM

## 2015-10-22 DIAGNOSIS — R739 Hyperglycemia, unspecified: Secondary | ICD-10-CM

## 2015-10-22 DIAGNOSIS — C50411 Malignant neoplasm of upper-outer quadrant of right female breast: Secondary | ICD-10-CM

## 2015-10-22 DIAGNOSIS — Z9889 Other specified postprocedural states: Secondary | ICD-10-CM | POA: Diagnosis not present

## 2015-10-22 DIAGNOSIS — E108 Type 1 diabetes mellitus with unspecified complications: Secondary | ICD-10-CM

## 2015-10-22 DIAGNOSIS — T451X5A Adverse effect of antineoplastic and immunosuppressive drugs, initial encounter: Secondary | ICD-10-CM

## 2015-10-22 DIAGNOSIS — Z5189 Encounter for other specified aftercare: Secondary | ICD-10-CM | POA: Diagnosis not present

## 2015-10-22 DIAGNOSIS — Z23 Encounter for immunization: Secondary | ICD-10-CM

## 2015-10-22 DIAGNOSIS — F329 Major depressive disorder, single episode, unspecified: Secondary | ICD-10-CM

## 2015-10-22 DIAGNOSIS — Z808 Family history of malignant neoplasm of other organs or systems: Secondary | ICD-10-CM | POA: Diagnosis not present

## 2015-10-22 DIAGNOSIS — D500 Iron deficiency anemia secondary to blood loss (chronic): Secondary | ICD-10-CM | POA: Diagnosis not present

## 2015-10-22 DIAGNOSIS — Z5111 Encounter for antineoplastic chemotherapy: Secondary | ICD-10-CM

## 2015-10-22 DIAGNOSIS — C50919 Malignant neoplasm of unspecified site of unspecified female breast: Secondary | ICD-10-CM

## 2015-10-22 DIAGNOSIS — K219 Gastro-esophageal reflux disease without esophagitis: Secondary | ICD-10-CM | POA: Diagnosis not present

## 2015-10-22 LAB — COMPREHENSIVE METABOLIC PANEL
ALBUMIN: 3.9 g/dL (ref 3.5–5.0)
ALK PHOS: 80 U/L (ref 38–126)
ALT: 17 U/L (ref 14–54)
ANION GAP: 9 (ref 5–15)
AST: 22 U/L (ref 15–41)
BILIRUBIN TOTAL: 1.1 mg/dL (ref 0.3–1.2)
BUN: 16 mg/dL (ref 6–20)
CO2: 25 mmol/L (ref 22–32)
Calcium: 9.4 mg/dL (ref 8.9–10.3)
Chloride: 99 mmol/L — ABNORMAL LOW (ref 101–111)
Creatinine, Ser: 0.65 mg/dL (ref 0.44–1.00)
GLUCOSE: 356 mg/dL — AB (ref 65–99)
POTASSIUM: 3.7 mmol/L (ref 3.5–5.1)
Sodium: 133 mmol/L — ABNORMAL LOW (ref 135–145)
TOTAL PROTEIN: 6.8 g/dL (ref 6.5–8.1)

## 2015-10-22 LAB — CBC WITH DIFFERENTIAL/PLATELET
BASOS PCT: 0 %
Basophils Absolute: 0 10*3/uL (ref 0.0–0.1)
Eosinophils Absolute: 0 10*3/uL (ref 0.0–0.7)
Eosinophils Relative: 0 %
HEMATOCRIT: 29.1 % — AB (ref 36.0–46.0)
Hemoglobin: 9.3 g/dL — ABNORMAL LOW (ref 12.0–15.0)
LYMPHS ABS: 1 10*3/uL (ref 0.7–4.0)
Lymphocytes Relative: 9 %
MCH: 27 pg (ref 26.0–34.0)
MCHC: 32 g/dL (ref 30.0–36.0)
MCV: 84.6 fL (ref 78.0–100.0)
MONO ABS: 0.6 10*3/uL (ref 0.1–1.0)
MONOS PCT: 6 %
NEUTROS ABS: 9.1 10*3/uL — AB (ref 1.7–7.7)
Neutrophils Relative %: 85 %
Platelets: 306 10*3/uL (ref 150–400)
RBC: 3.44 MIL/uL — ABNORMAL LOW (ref 3.87–5.11)
RDW: 17.2 % — AB (ref 11.5–15.5)
WBC: 10.6 10*3/uL — ABNORMAL HIGH (ref 4.0–10.5)

## 2015-10-22 MED ORDER — INFLUENZA VAC SPLIT QUAD 0.5 ML IM SUSY
0.5000 mL | PREFILLED_SYRINGE | Freq: Once | INTRAMUSCULAR | Status: AC
  Administered 2015-10-22: 0.5 mL via INTRAMUSCULAR
  Filled 2015-10-22: qty 0.5

## 2015-10-22 MED ORDER — SODIUM CHLORIDE 0.9 % IV SOLN
Freq: Once | INTRAVENOUS | Status: AC
  Administered 2015-10-22: 13:00:00 via INTRAVENOUS

## 2015-10-22 MED ORDER — HEPARIN SOD (PORK) LOCK FLUSH 100 UNIT/ML IV SOLN
500.0000 [IU] | Freq: Once | INTRAVENOUS | Status: AC | PRN
  Administered 2015-10-22: 500 [IU]
  Filled 2015-10-22: qty 5

## 2015-10-22 MED ORDER — DEXAMETHASONE SODIUM PHOSPHATE 10 MG/ML IJ SOLN
10.0000 mg | Freq: Once | INTRAMUSCULAR | Status: AC
  Administered 2015-10-22: 10 mg via INTRAVENOUS

## 2015-10-22 MED ORDER — HEPARIN SOD (PORK) LOCK FLUSH 100 UNIT/ML IV SOLN
INTRAVENOUS | Status: AC
  Filled 2015-10-22: qty 5

## 2015-10-22 MED ORDER — SODIUM CHLORIDE 0.9 % IV SOLN: 10.0000 mg | Freq: Once | INTRAVENOUS | Status: DC

## 2015-10-22 MED ORDER — SODIUM CHLORIDE 0.9% FLUSH: 10.0000 mL | INTRAVENOUS | Status: DC | PRN

## 2015-10-22 MED ORDER — DEXAMETHASONE SODIUM PHOSPHATE 10 MG/ML IJ SOLN
INTRAMUSCULAR | Status: AC
  Filled 2015-10-22: qty 1

## 2015-10-22 MED ORDER — PEGFILGRASTIM 6 MG/0.6ML ~~LOC~~ PSKT
6.0000 mg | PREFILLED_SYRINGE | Freq: Once | SUBCUTANEOUS | Status: AC
  Administered 2015-10-22: 6 mg via SUBCUTANEOUS
  Filled 2015-10-22: qty 0.6

## 2015-10-22 MED ORDER — DOCETAXEL CHEMO INJECTION 160 MG/16ML
75.0000 mg/m2 | Freq: Once | INTRAVENOUS | Status: AC
  Administered 2015-10-22: 140 mg via INTRAVENOUS
  Filled 2015-10-22: qty 14

## 2015-10-22 NOTE — Patient Instructions (Signed)
[facility] Discharge Instructions for Patients Receiving Chemotherapy   Beginning January 23rd 2017 lab work for the [facility] will be done in the  Main lab at [facility] on 1st floor. If you have a lab appointment with the [facility] please come in thru the  Main Entrance and check in at the main information desk   Today you received the following chemotherapy agents:  Taxotere  If you develop nausea and vomiting, or diarrhea that is not controlled by your medication, call the clinic.  The clinic phone number is [phone]. Office hours are Monday-Friday 8:30am-5:00pm.  BELOW ARE SYMPTOMS THAT SHOULD BE REPORTED IMMEDIATELY:  *FEVER GREATER THAN 101.0 F  *CHILLS WITH OR WITHOUT FEVER  NAUSEA AND VOMITING THAT IS NOT CONTROLLED WITH YOUR NAUSEA MEDICATION  *UNUSUAL SHORTNESS OF BREATH  *UNUSUAL BRUISING OR BLEEDING  TENDERNESS IN MOUTH AND THROAT WITH OR WITHOUT PRESENCE OF ULCERS  *URINARY PROBLEMS  *BOWEL PROBLEMS  UNUSUAL RASH Items with * indicate a potential emergency and should be followed up as soon as possible. If you have an emergency after office hours please contact your primary care physician or go to the nearest emergency department.  Please call the clinic during office hours if you have any questions or concerns.   You may also contact the Patient Navigator at [phone] should you have any questions or need assistance in obtaining follow up care.      Resources For Cancer Patients and their Caregivers ? American Cancer Society: Can assist with transportation, wigs, general needs, runs Look Good Feel Better.        (717)459-0890 ? Cancer Care: Provides financial assistance, online support groups, medication/co-pay assistance.  1-800-813-HOPE 310-389-9348) ? [facility] Assists [place] cancer patients and their families through emotional , educational and financial support.   303-007-8102 ? [place] DSS Where to apply for food stamps, Medicaid and utility assistance. 952-326-8588 ? RCATS: Transportation to medical appointments. 530-222-5656 ? Social Security Administration: May apply for disability if have a Stage IV cancer. 717-737-9389 505-390-8656 ? [employer], Disability and Transit Services: Assists with nutrition, care and transit needs. 236-586-7708

## 2015-10-22 NOTE — Progress Notes (Signed)
Dr. Whitney Muse and pt notified of pt's blood glucose level of 356.  Okay per MD for pt to use her own insulin for SSC.  Instructed pt to administer her insulin per SSC.   Tolerated tx w/o adverse reaction.  Alert, in no distress.  VSS. ONPRO neulasta on body injector placed.  See MAR for administration details. Injector in place and engaged with green light indicator on flashing. Tolerated application with out problems.  Discharged ambulatory.

## 2015-10-22 NOTE — Patient Instructions (Addendum)
[place] at [facility] Discharge Instructions  RECOMMENDATIONS MADE BY THE CONSULTANT AND ANY TEST RESULTS WILL BE SENT TO YOUR REFERRING PHYSICIAN.  You saw Dr. Whitney Muse today. Follow up in 3 weeks with MD , labs and chemo. IVF's next Monday and Wednesday.  Thank you for choosing [place] at [facility] to provide your oncology and hematology care.  To afford each patient quality time with our provider, please arrive at least 15 minutes before your scheduled appointment time.   Beginning January 23rd 2017 lab work for the [facility] will be done in the  Main lab at [facility] on 1st floor. If you have a lab appointment with the [facility] please come in thru the  Main Entrance and check in at the main information desk  You need to re-schedule your appointment should you arrive 10 or more minutes late.  We strive to give you quality time with our providers, and arriving late affects you and other patients whose appointments are after yours.  Also, if you no show three or more times for appointments you may be dismissed from the clinic at the providers discretion.     Again, thank you for choosing [facility].  Our hope is that these requests will decrease the amount of time that you wait before being seen by our physicians.       _____________________________________________________________  Should you have questions after your visit to [facility], please contact our office at [phone] between the hours of 8:30 a.m. and 4:30 p.m.  Voicemails left after 4:30 p.m. will not be returned until the following business day.  For prescription refill requests, have your pharmacy contact our office.         Resources For Cancer Patients and their Caregivers ? American Cancer Society: Can assist with transportation, wigs, general needs, runs Look Good Feel Better.        (815)173-5468 ? Cancer Care: Provides  financial assistance, online support groups, medication/co-pay assistance.  1-800-813-HOPE 720-668-2859) ? [facility] Assists [place] cancer patients and their families through emotional , educational and financial support.  (281)415-9560 ? [place] DSS Where to apply for food stamps, Medicaid and utility assistance. (712) 728-6714 ? RCATS: Transportation to medical appointments. 228-185-0122 ? Social Security Administration: May apply for disability if have a Stage IV cancer. 2543477270 907 051 8173 ? [employer], Disability and Transit Services: Assists with nutrition, care and transit needs. [place] Support Programs: @10RELATIVEDAYS @ > Cancer Support Group  2nd Tuesday of the month 1pm-2pm, Journey Room  > Creative Journey  3rd Tuesday of the month 1130am-1pm, Journey Room  > Look Good Feel Better  1st Wednesday of the month 10am-12 noon, Journey Room (Call [place] to register [phone])

## 2015-10-23 ENCOUNTER — Other Ambulatory Visit (HOSPITAL_COMMUNITY): Payer: Self-pay | Admitting: *Deleted

## 2015-10-23 ENCOUNTER — Encounter (HOSPITAL_COMMUNITY): Payer: Self-pay | Admitting: Hematology & Oncology

## 2015-10-23 DIAGNOSIS — C50411 Malignant neoplasm of upper-outer quadrant of right female breast: Secondary | ICD-10-CM

## 2015-10-23 DIAGNOSIS — Z171 Estrogen receptor negative status [ER-]: Principal | ICD-10-CM

## 2015-10-23 MED ORDER — SODIUM CHLORIDE 0.9 % IV SOLN: INTRAVENOUS | Status: DC

## 2015-10-26 ENCOUNTER — Encounter (HOSPITAL_COMMUNITY): Payer: Self-pay

## 2015-10-26 ENCOUNTER — Encounter (HOSPITAL_COMMUNITY)
Admission: RE | Admit: 2015-10-26 | Discharge: 2015-10-26 | Disposition: A | Discharge status: Home / Self Care | Payer: Medicaid Other | Source: Ambulatory Visit | Attending: Hematology & Oncology | Admitting: Hematology & Oncology

## 2015-10-26 DIAGNOSIS — Z171 Estrogen receptor negative status [ER-]: Secondary | ICD-10-CM | POA: Diagnosis not present

## 2015-10-26 DIAGNOSIS — C50411 Malignant neoplasm of upper-outer quadrant of right female breast: Secondary | ICD-10-CM | POA: Insufficient documentation

## 2015-10-26 MED ORDER — SODIUM CHLORIDE 0.9 % IV SOLN
INTRAVENOUS | Status: DC
  Administered 2015-10-26: 12:00:00 via INTRAVENOUS

## 2015-10-26 MED ORDER — HEPARIN SOD (PORK) LOCK FLUSH 100 UNIT/ML IV SOLN
500.0000 [IU] | INTRAVENOUS | Status: AC | PRN
  Administered 2015-10-26: 500 [IU]
  Filled 2015-10-26: qty 5

## 2015-10-27 ENCOUNTER — Encounter: Payer: Self-pay | Admitting: *Deleted

## 2015-10-27 NOTE — Progress Notes (Signed)
[place] Clinical Social Work  Clinical Social Work was referred by Development worker, community for assessment of psychosocial needs due to financial assistance and past emotional concerns. Clinical Social Worker contacted patient at home to offer support and re-assess for needs.  Pt having issues with household bills and financial counselor is assisting her with this. She also reports issues meeting her medicaid medication co-pays. This is a hardship for her even with the low cost. CSW reviewed other options for assistance such as Cancer Care and [facility]. CSW provided pt with contact numbers for each. Pt reports she has felt better emotionally and has been to [facility] once for counseling support and plans to reach out for an additional session. CSW encouraged her to consider Support Group and possibility of individual counseling through [facility]. Pt reports she plans to attend Support Group on Nov. 14. Pt is not ready for a Nutritional therapist through [employer] currently. Pt sounded to be in better spirits and agrees to CSW to continue to follow for support.     Clinical Social Work interventions: Catering manager and Referral Supportive listening  [place], [place] Tuesdays   [phone]

## 2015-10-28 ENCOUNTER — Ambulatory Visit (HOSPITAL_COMMUNITY): Payer: Medicaid Other

## 2015-10-29 ENCOUNTER — Ambulatory Visit (HOSPITAL_COMMUNITY)
Admission: RE | Admit: 2015-10-29 | Discharge: 2015-10-29 | Disposition: A | Discharge status: Home / Self Care | Payer: Medicaid Other | Source: Ambulatory Visit | Attending: Hematology & Oncology | Admitting: Hematology & Oncology

## 2015-10-29 DIAGNOSIS — C50411 Malignant neoplasm of upper-outer quadrant of right female breast: Secondary | ICD-10-CM | POA: Diagnosis not present

## 2015-10-29 DIAGNOSIS — R519 Headache, unspecified: Secondary | ICD-10-CM

## 2015-10-29 DIAGNOSIS — R51 Headache: Secondary | ICD-10-CM | POA: Insufficient documentation

## 2015-10-29 MED ORDER — IOPAMIDOL (ISOVUE-300) INJECTION 61%
75.0000 mL | Freq: Once | INTRAVENOUS | Status: AC | PRN
  Administered 2015-10-29: 75 mL via INTRAVENOUS

## 2015-11-02 ENCOUNTER — Ambulatory Visit (HOSPITAL_COMMUNITY): Payer: Medicaid Other

## 2015-11-04 ENCOUNTER — Ambulatory Visit (HOSPITAL_COMMUNITY): Payer: Medicaid Other

## 2015-11-09 ENCOUNTER — Ambulatory Visit (HOSPITAL_COMMUNITY): Payer: Medicaid Other

## 2015-11-11 ENCOUNTER — Encounter (HOSPITAL_COMMUNITY): Payer: Medicaid Other

## 2015-11-11 ENCOUNTER — Other Ambulatory Visit (HOSPITAL_COMMUNITY): Payer: Self-pay | Admitting: Hematology & Oncology

## 2015-11-11 ENCOUNTER — Other Ambulatory Visit (HOSPITAL_COMMUNITY): Payer: Self-pay | Admitting: Oncology

## 2015-11-11 DIAGNOSIS — T451X5A Adverse effect of antineoplastic and immunosuppressive drugs, initial encounter: Principal | ICD-10-CM

## 2015-11-11 DIAGNOSIS — C50411 Malignant neoplasm of upper-outer quadrant of right female breast: Secondary | ICD-10-CM

## 2015-11-11 DIAGNOSIS — D701 Agranulocytosis secondary to cancer chemotherapy: Secondary | ICD-10-CM

## 2015-11-12 ENCOUNTER — Encounter (HOSPITAL_BASED_OUTPATIENT_CLINIC_OR_DEPARTMENT_OTHER): Payer: Medicaid Other | Admitting: Hematology & Oncology

## 2015-11-12 ENCOUNTER — Encounter (HOSPITAL_COMMUNITY): Payer: Medicaid Other | Attending: Hematology & Oncology

## 2015-11-12 ENCOUNTER — Encounter (HOSPITAL_COMMUNITY): Payer: Self-pay | Admitting: Hematology & Oncology

## 2015-11-12 VITALS — BP 140/88 | HR 75 | Temp 98.3°F | Resp 18

## 2015-11-12 VITALS — BP 138/77 | HR 86 | Temp 98.2°F | Resp 16 | Wt 166.8 lb

## 2015-11-12 DIAGNOSIS — Z171 Estrogen receptor negative status [ER-]: Secondary | ICD-10-CM | POA: Insufficient documentation

## 2015-11-12 DIAGNOSIS — D500 Iron deficiency anemia secondary to blood loss (chronic): Secondary | ICD-10-CM

## 2015-11-12 DIAGNOSIS — D6481 Anemia due to antineoplastic chemotherapy: Secondary | ICD-10-CM | POA: Diagnosis not present

## 2015-11-12 DIAGNOSIS — F329 Major depressive disorder, single episode, unspecified: Secondary | ICD-10-CM | POA: Diagnosis not present

## 2015-11-12 DIAGNOSIS — R200 Anesthesia of skin: Secondary | ICD-10-CM | POA: Insufficient documentation

## 2015-11-12 DIAGNOSIS — D701 Agranulocytosis secondary to cancer chemotherapy: Secondary | ICD-10-CM

## 2015-11-12 DIAGNOSIS — Z91048 Other nonmedicinal substance allergy status: Secondary | ICD-10-CM | POA: Diagnosis not present

## 2015-11-12 DIAGNOSIS — C50919 Malignant neoplasm of unspecified site of unspecified female breast: Secondary | ICD-10-CM

## 2015-11-12 DIAGNOSIS — Z5111 Encounter for antineoplastic chemotherapy: Secondary | ICD-10-CM

## 2015-11-12 DIAGNOSIS — E108 Type 1 diabetes mellitus with unspecified complications: Secondary | ICD-10-CM

## 2015-11-12 DIAGNOSIS — Z794 Long term (current) use of insulin: Secondary | ICD-10-CM | POA: Insufficient documentation

## 2015-11-12 DIAGNOSIS — C50411 Malignant neoplasm of upper-outer quadrant of right female breast: Secondary | ICD-10-CM | POA: Insufficient documentation

## 2015-11-12 DIAGNOSIS — E109 Type 1 diabetes mellitus without complications: Secondary | ICD-10-CM | POA: Insufficient documentation

## 2015-11-12 DIAGNOSIS — L732 Hidradenitis suppurativa: Secondary | ICD-10-CM | POA: Diagnosis not present

## 2015-11-12 DIAGNOSIS — Z79899 Other long term (current) drug therapy: Secondary | ICD-10-CM | POA: Insufficient documentation

## 2015-11-12 DIAGNOSIS — T451X5A Adverse effect of antineoplastic and immunosuppressive drugs, initial encounter: Secondary | ICD-10-CM

## 2015-11-12 DIAGNOSIS — D509 Iron deficiency anemia, unspecified: Secondary | ICD-10-CM | POA: Insufficient documentation

## 2015-11-12 DIAGNOSIS — Z91013 Allergy to seafood: Secondary | ICD-10-CM | POA: Diagnosis not present

## 2015-11-12 DIAGNOSIS — T380X5A Adverse effect of glucocorticoids and synthetic analogues, initial encounter: Secondary | ICD-10-CM | POA: Diagnosis not present

## 2015-11-12 DIAGNOSIS — Z888 Allergy status to other drugs, medicaments and biological substances status: Secondary | ICD-10-CM | POA: Diagnosis not present

## 2015-11-12 DIAGNOSIS — K5190 Ulcerative colitis, unspecified, without complications: Secondary | ICD-10-CM | POA: Diagnosis not present

## 2015-11-12 LAB — COMPREHENSIVE METABOLIC PANEL
ALK PHOS: 71 U/L (ref 38–126)
ALT: 14 U/L (ref 14–54)
AST: 15 U/L (ref 15–41)
Albumin: 4.1 g/dL (ref 3.5–5.0)
Anion gap: 8 (ref 5–15)
BILIRUBIN TOTAL: 0.8 mg/dL (ref 0.3–1.2)
BUN: 16 mg/dL (ref 6–20)
CALCIUM: 9.6 mg/dL (ref 8.9–10.3)
CO2: 26 mmol/L (ref 22–32)
CREATININE: 0.59 mg/dL (ref 0.44–1.00)
Chloride: 102 mmol/L (ref 101–111)
Glucose, Bld: 423 mg/dL — ABNORMAL HIGH (ref 65–99)
Potassium: 3.5 mmol/L (ref 3.5–5.1)
Sodium: 136 mmol/L (ref 135–145)
Total Protein: 6.9 g/dL (ref 6.5–8.1)

## 2015-11-12 LAB — CBC WITH DIFFERENTIAL/PLATELET
Basophils Absolute: 0 10*3/uL (ref 0.0–0.1)
Basophils Relative: 0 %
Eosinophils Absolute: 0 10*3/uL (ref 0.0–0.7)
Eosinophils Relative: 0 %
HEMATOCRIT: 33 % — AB (ref 36.0–46.0)
HEMOGLOBIN: 10.6 g/dL — AB (ref 12.0–15.0)
LYMPHS ABS: 0.8 10*3/uL (ref 0.7–4.0)
LYMPHS PCT: 7 %
MCH: 27.2 pg (ref 26.0–34.0)
MCHC: 32.1 g/dL (ref 30.0–36.0)
MCV: 84.6 fL (ref 78.0–100.0)
Monocytes Absolute: 0.3 10*3/uL (ref 0.1–1.0)
Monocytes Relative: 3 %
NEUTROS PCT: 90 %
Neutro Abs: 10.1 10*3/uL — ABNORMAL HIGH (ref 1.7–7.7)
Platelets: 267 10*3/uL (ref 150–400)
RBC: 3.9 MIL/uL (ref 3.87–5.11)
RDW: 16.6 % — ABNORMAL HIGH (ref 11.5–15.5)
WBC: 11.2 10*3/uL — AB (ref 4.0–10.5)

## 2015-11-12 MED ORDER — SODIUM CHLORIDE 0.9 % IV SOLN
Freq: Once | INTRAVENOUS | Status: AC
  Administered 2015-11-12: 12:00:00 via INTRAVENOUS

## 2015-11-12 MED ORDER — PEGFILGRASTIM 6 MG/0.6ML ~~LOC~~ PSKT
6.0000 mg | PREFILLED_SYRINGE | Freq: Once | SUBCUTANEOUS | Status: AC
  Administered 2015-11-12: 6 mg via SUBCUTANEOUS
  Filled 2015-11-12: qty 0.6

## 2015-11-12 MED ORDER — HEPARIN SOD (PORK) LOCK FLUSH 100 UNIT/ML IV SOLN
500.0000 [IU] | Freq: Once | INTRAVENOUS | Status: AC | PRN
  Administered 2015-11-12: 500 [IU]
  Filled 2015-11-12: qty 5

## 2015-11-12 MED ORDER — DEXAMETHASONE SODIUM PHOSPHATE 10 MG/ML IJ SOLN
10.0000 mg | Freq: Once | INTRAMUSCULAR | Status: AC
  Administered 2015-11-12: 10 mg via INTRAVENOUS
  Filled 2015-11-12: qty 1

## 2015-11-12 MED ORDER — SODIUM CHLORIDE 0.9% FLUSH: 10.0000 mL | INTRAVENOUS | Status: DC | PRN

## 2015-11-12 MED ORDER — DOCETAXEL CHEMO INJECTION 160 MG/16ML
75.0000 mg/m2 | Freq: Once | INTRAVENOUS | Status: AC
  Administered 2015-11-12: 140 mg via INTRAVENOUS
  Filled 2015-11-12: qty 14

## 2015-11-12 NOTE — Progress Notes (Signed)
Pt made aware that her blood glucose level this morning is 423.  She has her own insulin with her and will take according to her [place].   Tolerated tx w/o adverse reaction; alert, in no distress.  VSS.  Discharged ambulatory.

## 2015-11-12 NOTE — Patient Instructions (Signed)
[place] at [facility] Discharge Instructions  RECOMMENDATIONS MADE BY THE CONSULTANT AND ANY TEST RESULTS WILL BE SENT TO YOUR REFERRING PHYSICIAN.  You saw Dr.Penland today. Follow up in 2 weeks with lab work. See Amy at checkout for appointments.  Thank you for choosing [place] at [facility] to provide your oncology and hematology care.  To afford each patient quality time with our provider, please arrive at least 15 minutes before your scheduled appointment time.   Beginning January 23rd 2017 lab work for the [facility] will be done in the  Main lab at [facility] on 1st floor. If you have a lab appointment with the [facility] please come in thru the  Main Entrance and check in at the main information desk  You need to re-schedule your appointment should you arrive 10 or more minutes late.  We strive to give you quality time with our providers, and arriving late affects you and other patients whose appointments are after yours.  Also, if you no show three or more times for appointments you may be dismissed from the clinic at the providers discretion.     Again, thank you for choosing [facility].  Our hope is that these requests will decrease the amount of time that you wait before being seen by our physicians.       _____________________________________________________________  Should you have questions after your visit to [facility], please contact our office at [phone] between the hours of 8:30 a.m. and 4:30 p.m.  Voicemails left after 4:30 p.m. will not be returned until the following business day.  For prescription refill requests, have your pharmacy contact our office.         Resources For Cancer Patients and their Caregivers ? American Cancer Society: Can assist with transportation, wigs, general needs, runs Look Good Feel Better.        225-831-7869 ? Cancer Care: Provides  financial assistance, online support groups, medication/co-pay assistance.  1-800-813-HOPE (404)244-5926) ? [place] Assists [place] cancer patients and their families through emotional , educational and financial support.  219-860-7120 ? [place] DSS Where to apply for food stamps, Medicaid and utility assistance. [phone] ? RCATS: Transportation to medical appointments. ([phone] ? Social Security Administration: May apply for disability if have a Stage IV cancer. 512-345-2083 [phone] ? [employer], Disability and Transit Services: Assists with nutrition, care and transit needs. [place] Support Programs: @10RELATIVEDAYS @ > Cancer Support Group  2nd Tuesday of the month 1pm-2pm, Journey Room  > Creative Journey  3rd Tuesday of the month 1130am-1pm, Journey Room  > Look Good Feel Better  1st Wednesday of the month 10am-12 noon, Journey Room (Call [place] to register [phone])

## 2015-11-12 NOTE — Progress Notes (Signed)
[place]  PROGRESS NOTE  Patient Care Team: Brittney Bis, MD as PCP - General (Unknown Physician Specialty)  CHIEF COMPLAINTS/PURPOSE OF CONSULTATION:     Breast cancer of upper-outer quadrant of right female breast ([place])   04/29/2015 Initial Biopsy    upper outer quadrant mass R breast 2.9 cm by ultrasound, high grade invasive ductal carcinoma ER< 1%, PR 1%, HER -2 not amplified, Ki-67 66%      05/13/2015 Cancer Staging    pT1cN0      05/13/2015 Surgery    lumpectomy and sentinel node biopsies X 3, 2 cm tumor, 3 negative nodes       05/21/2015 Imaging    CT [facility]P [facility] with postsurgical changes related to R breast lympectomy and R axillary LN dissection, no findings suspicious for metastatic disease      06/12/2015 Procedure    Port placement by Dr. Anthony [name]      06/17/2015 Imaging    MUGA- Normal left ventricular ejection fraction equal to 62.9%.      06/25/2015 - 08/13/2015 Chemotherapy    AC x 4      08/27/2015 - 09/03/2015 Chemotherapy    Weekly Taxol      09/11/2015 - 09/15/2015 Hospital Admission    Neutropenic fever, ARF secondary to antibiotics      10/01/2015 -  Chemotherapy    Taxotere 75 mg/m2 Q3 weeks         HISTORY OF PRESENTING ILLNESS:  Brittney [name] 45 y.o. 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 She is here today for her last cycle of Taxotere.   Patient is unaccompanied. She is doing well, but reports right leg pain that radiates to hip. Pain has worsened over the last few weeks. Brittney [name] says the pain and cold weather exacerbates symptoms.   Hidradenitisis stable.  She was constipated, but states symptoms have improved. Patient says she experiences intermittent increased heart beat. She also experiences memory loss. She attributes this to chemotherapy and chemobrain.   She requested a cheaper alternative to Nifrex.  Brittney [name] has an  appointment with Dr. Lisbeth [name] on 12/03/2015.  She needs a refill for hydrocodone. NO nausea or vomiting. Mood is improved.  MEDICAL HISTORY:  Past Medical History:  Diagnosis Date  . Breast cancer ([place])   . Breast cancer of upper-outer quadrant of right female breast ([place]) 06/11/2015   Triple negative, 2 cm   . Chemotherapy induced neutropenia ([place]) 09/10/2015  . Diabetes mellitus ([place])    Type 1 over 15 yrs  . Environmental allergies   . GERD (gastroesophageal reflux disease)   . Iron deficiency anemia due to chronic blood loss 06/12/2015  . 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 CESAREAN SECTION    . COLONOSCOPY N/A 10/09/2013   Procedure: COLONOSCOPY;  Surgeon: Brittney Houston, MD;  Location[facility];  Service: Endoscopy;  Laterality: N/A;  100  . DILATION AND CURETTAGE OF UTERUS     x 2 for miscarriages  . ESOPHAGOGASTRODUODENOSCOPY    . FLEXIBLE SIGMOIDOSCOPY    . FLEXIBLE SIGMOIDOSCOPY  07/27/2011   Procedure: FLEXIBLE SIGMOIDOSCOPY;  Surgeon: Brittney Houston, MD;  Location: [facility] SU[facility];  Service: Endoscopy;  Laterality: N/A;  100    SOCIAL HISTORY: Social History   Social History  . Marital status:  Single    Spouse name: N/A  . Number of children: N/A  . Years of education: N/A   Occupational History  . Not on file.   Social History Main Topics  . Smoking status: Never Smoker  . Smokeless tobacco: Never Used  . Alcohol use No  . Drug use: No  . Sexual activity: Not on file   Other Topics Concern  . Not on file   Social History Narrative  . No narrative on file   She is single. Has a 68 year old daughter. Aged 22. Works in home health care. Also going to school. Has to sit out her very last term in order to do her chemotherapy. Working toward RNA/CNA.  She does not smoke.  She loves to read, spend time with her family. She used to like to  shop but doesnm't have a lot of money to spend now, so it's at the bottom of the list.   FAMILY HISTORY: Family History  Problem Relation Age of Onset  . Non-Hodgkin's lymphoma Mother   . Hypertension Mother   . Diabetes Mellitus II Mother   . Hypertension Father   . Diabetes Mellitus II Father   . Colon cancer Cousin   . Breast cancer Maternal Aunt    Mom diagnosed in 2013 with non-hodgkin's lymphoma. Had chemotherapy, radiation, more chemotherapy, and stem-cell at [facility] using her cells. Her mother has been in remission since. Mother is aged 73 this month (June 20th, 2017). Father is 48 as well; has high blood pressure. PTSD. Has been treated at the [facility] in [place]. She believes he has high cholesterol too, like her mother. Otherwise, he is "alright I guess."  Has an older brother, and a younger sister. No one else in her immediate family with cancer.   ALLERGIES:  is allergic to ace inhibitors; adhesive [tape]; pravastatin sodium; statins; peanuts [peanut oil]; and shellfish allergy.  MEDICATIONS:  Current Outpatient Prescriptions  Medication Sig Dispense Refill  . acetaminophen (TYLENOL) 500 MG tablet Take 1,000 mg by mouth every 6 (six) hours as needed. For pain    . acyclovir (ZOVIRAX) 200 MG capsule Take 200 mg by mouth 2 (two) times daily. Reported on 07/23/2015    . albuterol (PROVENTIL HFA;VENTOLIN HFA) 108 (90 Base) MCG/ACT inhaler Inhale into the lungs every 6 (six) hours as needed for wheezing or shortness of breath.    . APRISO 0.375 g 24 hr capsule TAKE FOUR (4) CAPSULES BY MOUTH EVERY MORNING 120 capsule 5  . clotrimazole-betamethasone (LOTRISONE) cream Apply 1 application topically 2 (two) times daily. Apply to groin 30 g 0  . cyclobenzaprine (FLEXERIL) 5 MG tablet Take 1 tablet (5 mg total) by mouth 3 (three) times daily as needed for muscle spasms. 60 tablet 1  . dexamethasone (DECADRON) 4 MG tablet Take 2 tablets (8 mg total) by mouth 2 (two) times  daily. Start the day before Taxotere. Then daily after chemo for 2 days. 30 tablet 1  . DOCEtaxel (TAXOTERE IV) Inject into the vein. Every 3 weeks    . EPINEPHrine (EPIPEN) 0.3 mg/0.3 mL SOAJ injection Inject into the muscle as needed.    [name] FLUoxetine (PROZAC) 40 MG capsule Take 1 capsule (40 mg total) by mouth daily. 30 capsule 3  . gabapentin (NEURONTIN) 100 MG capsule Take 100 mg by mouth 3 (three) times daily.     [name] HYDROcodone-acetaminophen (NORCO/VICODIN) 5-325 MG tablet TAKE ONE TABLET BY MOUTH EVERY 4 HOURS AS NEEDED FOR MODERATE PAIN  30 tablet 0  . ibuprofen (ADVIL,MOTRIN) 200 MG tablet Take 400 mg by mouth every 6 (six) hours as needed for moderate pain.    [name] insulin glargine (LANTUS) 100 UNIT/ML injection Inject 20 Units into the skin at bedtime.     . insulin lispro protamine-insulin lispro (HUMALOG 50/50) (50-50) 100 UNIT/ML SUSP Inject 10 Units into the skin 4 (four) times daily. With each meal    . lidocaine-prilocaine (EMLA) cream Apply a quarter size amount to port site 1 hour prior to chemo. Do not rub in. Cover with plastic wrap. 30 g 3  . loratadine (CLARITIN) 10 MG tablet TAKE ONE TABLET BY MOUTH DAILY FOR HIVES.  6  . LORazepam (ATIVAN) 0.5 MG tablet Take 1 tablet (0.5 mg total) by mouth every 8 (eight) hours. For nausea or anxiety 45 tablet 0  . Melatonin 5 MG TABS Take 5 mg by mouth at bedtime.    . mometasone (NASONEX) 50 MCG/ACT nasal spray Place 2 sprays into the nose daily as needed.     . mupirocin ointment (BACTROBAN) 2 % Place 1 application into the nose 2 (two) times daily. (Patient taking differently: Place 1 application into the nose 2 (two) times daily as needed (irritation). ) 22 g 0  . omeprazole (PRILOSEC) 20 MG capsule Take 20 mg by mouth 2 (two) times daily before a meal.     . ondansetron (ZOFRAN ODT) 8 MG disintegrating tablet Take 1 tablet (8 mg total) by mouth every 8 (eight) hours as needed for nausea or vomiting. 30 tablet 2  . Pegfilgrastim (NEULASTA  ONPRO [place]) Inject into the skin. Every 21 days    . polyethylene glycol powder (GLYCOLAX/MIRALAX) powder Take 8.5 g by mouth daily. (Patient taking differently: Take 8.5 g by mouth daily as needed for mild constipation. ) 255 g 5  . potassium chloride SA (K-DUR,KLOR-CON) 20 MEQ tablet Take 1 tablet (20 mEq total) by mouth daily. 30 tablet 2  . promethazine (PHENERGAN) 25 MG tablet Take 1 tablet (25 mg total) by mouth every 6 (six) hours as needed for nausea or vomiting. 30 tablet 2  . iron polysaccharides (NIFEREX) 150 MG capsule Take 150 mg by mouth daily.     No current facility-administered medications for this visit.    Facility-Administered Medications Ordered in Other Visits  Medication Dose Route Frequency Provider Last Rate Last Dose  . 0.9 %  sodium chloride infusion   Intravenous Continuous Manon Hilding Kefalas, PA-C      . DOCEtaxel (TAXOTERE) 140 mg in dextrose 5 % 250 mL chemo infusion  75 mg/m2 (Treatment Plan Recorded) Intravenous Once Patrici Ranks, MD 264 mL/hr at 11/12/15 1155 140 mg at 11/12/15 1155  . heparin lock flush 100 unit/mL  500 Units Intracatheter Once PRN Patrici Ranks, MD      . pegfilgrastim (NEULASTA ONPRO KIT) injection 6 mg  6 mg Subcutaneous Once Patrici Ranks, MD      . sodium chloride flush (NS) 0.9 % injection 10 mL  10 mL Intracatheter PRN Patrici Ranks, MD        Review of Systems  Constitutional: Negative.  Negative for chills, fever, malaise/fatigue and weight loss.  HENT: Negative.  Negative for congestion, hearing loss, nosebleeds, sore throat and tinnitus.   Eyes: Negative.  Negative for blurred vision, double vision, pain and discharge.  Respiratory: Negative.  Negative for cough, hemoptysis, sputum production, shortness of breath and wheezing.   Cardiovascular: Positive for chest pain and  palpitations. Negative for claudication, leg swelling and PND.  Gastrointestinal: Negative.  Negative for abdominal pain, blood in stool,  constipation, diarrhea, heartburn, melena, nausea and vomiting.  Genitourinary: Negative.  Negative for dysuria, frequency, hematuria and urgency.  Musculoskeletal: Negative for falls and myalgias.       Right leg pain Hip pain  Skin: Negative.  Negative for itching and rash.  Neurological: Negative.  Negative for dizziness, tingling, tremors, sensory change, speech change, focal weakness, seizures, loss of consciousness, weakness and headaches.  Endo/Heme/Allergies: Negative.  Does not bruise/bleed easily.  Psychiatric/Behavioral: Positive for memory loss. Negative for depression, substance abuse and suicidal ideas. The patient is not nervous/anxious and does not have insomnia.   All other systems reviewed and are negative. 14 point ROS was done and is otherwise as detailed above or in HPI   PHYSICAL EXAMINATION: ECOG PERFORMANCE STATUS: 1 - Symptomatic but completely ambulatory  Vitals:   11/12/15 1043  BP: 138/77  Pulse: 86  Resp: 16  Temp: 98.2 F (36.8 C)   Filed Weights   11/12/15 1043  Weight: 166 lb 12.8 oz (75.7 kg)    Physical Exam  Constitutional: She is oriented to person, place, and time and well-developed, well-nourished, and in no distress.  alopecia  HENT:  Head: Normocephalic and atraumatic.  Nose: Nose normal.  Mouth/Throat: Oropharynx is clear and moist. No oropharyngeal exudate.  Eyes: Conjunctivae and EOM are normal. Pupils are equal, round, and reactive to light. Right eye exhibits no discharge. Left eye exhibits no discharge. No scleral icterus.  Neck: Normal range of motion. Neck supple. No tracheal deviation present. No thyromegaly present.  Cardiovascular: Normal rate, regular rhythm and normal heart sounds.  Exam reveals no gallop and no friction rub.   No murmur heard. Pulmonary/Chest: Effort normal and breath sounds normal. She has no wheezes. She has no rales.  Abdominal: Soft. Bowel sounds are normal. She exhibits no distension and no mass.  There is no tenderness. There is no rebound and no guarding.  Musculoskeletal: Normal range of motion. She exhibits no edema.  Lymphadenopathy:    She has no cervical adenopathy.  Neurological: She is alert and oriented to person, place, and time. She has normal reflexes. No cranial nerve deficit. Gait normal. Coordination normal.  Skin: Skin is warm and dry. No rash noted.  Psychiatric: Mood, memory, affect and judgment normal.  Nursing note and vitals reviewed.    LABORATORY DATA: I have reviewed the data as listed. Results for Brittney, [name] (MRN [id number]) as of 11/12/2015 12:52  Ref. Range 11/12/2015 10:20  Sodium Latest Ref Range: 135 - 145 mmol/L 136  Potassium Latest Ref Range: 3.5 - 5.1 mmol/L 3.5  Chloride Latest Ref Range: 101 - 111 mmol/L 102  CO2 Latest Ref Range: 22 - 32 mmol/L 26  BUN Latest Ref Range: 6 - 20 mg/dL 16  Creatinine Latest Ref Range: 0.44 - 1.00 mg/dL 0.59  Calcium Latest Ref Range: 8.9 - 10.3 mg/dL 9.6  EGFR (Non-African Amer.) Latest Ref Range: >60 mL/min >60  EGFR (African American) Latest Ref Range: >60 mL/min >60  Glucose Latest Ref Range: 65 - 99 mg/dL 423 (H)  Anion gap Latest Ref Range: 5 - 15  8  Alkaline Phosphatase Latest Ref Range: 38 - 126 U/L 71  Albumin Latest Ref Range: 3.5 - 5.0 g/dL 4.1  AST Latest Ref Range: 15 - 41 U/L 15  ALT Latest Ref Range: 14 - 54 U/L 14  Total Protein Latest Ref Range:  6.5 - 8.1 g/dL 6.9  Total Bilirubin Latest Ref Range: 0.3 - 1.2 mg/dL 0.8  WBC Latest Ref Range: 4.0 - 10.5 K/uL 11.2 (H)  RBC Latest Ref Range: 3.87 - 5.11 MIL/uL 3.90  Hemoglobin Latest Ref Range: 12.0 - 15.0 g/dL 10.6 (L)  HCT Latest Ref Range: 36.0 - 46.0 % 33.0 (L)  MCV Latest Ref Range: 78.0 - 100.0 fL 84.6  MCH Latest Ref Range: 26.0 - 34.0 pg 27.2  MCHC Latest Ref Range: 30.0 - 36.0 g/dL 32.1  RDW Latest Ref Range: 11.5 - 15.5 % 16.6 (H)  Platelets Latest Ref Range: 150 - 400 K/uL 267  Neutrophils Latest Units: % 90  Lymphocytes  Latest Units: % 7  Monocytes Relative Latest Units: % 3  Eosinophil Latest Units: % 0  Basophil Latest Units: % 0  NEUT# Latest Ref Range: 1.7 - 7.7 K/uL 10.1 (H)  Lymphocyte # Latest Ref Range: 0.7 - 4.0 K/uL 0.8  Monocyte # Latest Ref Range: 0.1 - 1.0 K/uL 0.3  Eosinophils Absolute Latest Ref Range: 0.0 - 0.7 K/uL 0.0  Basophils Absolute Latest Ref Range: 0.0 - 0.1 K/uL 0.0    RADIOGRAPHY: I have reviewed the data as listed below. Study Result   CLINICAL DATA:  Right breast cancer diagnosed April 2017. Headaches.  EXAM: CT HEAD WITHOUT AND WITH CONTRAST  TECHNIQUE: Contiguous axial images were obtained from the base of the skull through the vertex without and with intravenous contrast  CONTRAST:  29m ISOVUE-300 IOPAMIDOL intravenous  COMPARISON:  08/15/2011  FINDINGS: Brain: No evidence of acute infarction, hemorrhage, hydrocephalus, extra-axial collection or mass lesion/mass effect.  Vascular: No hyperdense vessel or unexpected calcification. Visible vessels are patent.  Skull: Stable and negative for fracture or focal lesion.  Sinuses/Orbits: Negative  IMPRESSION: No explanation for headache.  No evidence of metastatic disease.   Electronically Signed   By: JMonte FantasiaM.D.   On: 10/29/2015 11:33     PATHOLOGY:   ASSESSMENT & PLAN:  pT1cN0 triple negative carcinoma of the R breast S/P lumpectomy and sentinel LN procedure Iron deficiency Anemia Ulcerative Colitis followed by Dr. RLaural GoldenOngoing Menses Negative Genetic Testing [facility]urgery LLCChemotherapy induced neutropenia Taxol induced musculoskeletal pain Hidradenitis Chemotherapy induced anemia Depression Steroid induced hyperglycemia Type one diabetes  She is doing better. Mood is improved on increased prozac. She will continue. Labs are reviewed with the patient in detail. Results are noted above. She is going to proceed with Taxotere today. We will follow-up in 2 weeks to re-assess  her symptoms but overall she is doing well.   She has an insulin pump and will self administer insulin for her hyperglycemia.   I have arranged for IV fluids next week per the patient's request.   With regards to Nifrex being too expensive, I will have patient take regular iron.  I have refilled hydrocodone.  RTC in 2 weeks.  Orders Placed This Encounter  Procedures  . CBC with Differential    Standing Status:   Future    Standing Expiration Date:   11/11/2016  . Comprehensive metabolic panel    Standing Status:   Future    Standing Expiration Date:   11/11/2016   All questions were answered. The patient knows to call the clinic with any problems, questions or concerns.  This document serves as a record of services personally performed by SAncil Linsey MD. It was created on her behalf by DElmyra Ricks a trained medical scribe. The creation of this record is  based on the scribe's personal observations and the provider's statements to them. This document has been checked and approved by the attending provider.  I have reviewed the above documentation for accuracy and completeness and I agree with the above.  This note was electronically signed.  Molli Hazard, MD  11/12/2015 12:52 PM

## 2015-11-12 NOTE — Patient Instructions (Signed)
[facility] Discharge Instructions for Patients Receiving Chemotherapy   Beginning January 23rd 2017 lab work for the [facility] will be done in the  Main lab at [facility] on 1st floor. If you have a lab appointment with the [facility] please come in thru the  Main Entrance and check in at the main information desk   Today you received the following chemotherapy agents:  Taxotere  To help prevent nausea and vomiting after your treatment, we encourage you to take your nausea medication as prescribed.  If you develop nausea and vomiting, or diarrhea that is not controlled by your medication, call the clinic.  The clinic phone number is [phone]. Office hours are Monday-Friday 8:30am-5:00pm.  BELOW ARE SYMPTOMS THAT SHOULD BE REPORTED IMMEDIATELY:  *FEVER GREATER THAN 101.0 F  *CHILLS WITH OR WITHOUT FEVER  NAUSEA AND VOMITING THAT IS NOT CONTROLLED WITH YOUR NAUSEA MEDICATION  *UNUSUAL SHORTNESS OF BREATH  *UNUSUAL BRUISING OR BLEEDING  TENDERNESS IN MOUTH AND THROAT WITH OR WITHOUT PRESENCE OF ULCERS  *URINARY PROBLEMS  *BOWEL PROBLEMS  UNUSUAL RASH Items with * indicate a potential emergency and should be followed up as soon as possible. If you have an emergency after office hours please contact your primary care physician or go to the nearest emergency department.  Please call the clinic during office hours if you have any questions or concerns.   You may also contact the Patient Navigator at [phone] should you have any questions or need assistance in obtaining follow up care.      Resources For Cancer Patients and their Caregivers ? American Cancer Society: Can assist with transportation, wigs, general needs, runs Look Good Feel Better.        608-041-4965 ? Cancer Care: Provides financial assistance, online support groups, medication/co-pay assistance.  1-800-813-HOPE 450-785-3509) ? [place] Assists  [place] cancer patients and their families through emotional , educational and financial support.  2400129976 ? [place] DSS Where to apply for food stamps, Medicaid and utility assistance. 346-541-9949 ? RCATS: Transportation to medical appointments. 205-483-0221 ? Social Security Administration: May apply for disability if have a Stage IV cancer. 405-642-6113 380-528-3084 ? [employer], Disability and Transit Services: Assists with nutrition, care and transit needs. [phone]

## 2015-12-01 ENCOUNTER — Encounter (HOSPITAL_BASED_OUTPATIENT_CLINIC_OR_DEPARTMENT_OTHER): Payer: Medicaid Other | Admitting: Hematology & Oncology

## 2015-12-01 ENCOUNTER — Encounter (HOSPITAL_COMMUNITY): Payer: Self-pay | Admitting: Hematology & Oncology

## 2015-12-01 ENCOUNTER — Other Ambulatory Visit (HOSPITAL_COMMUNITY): Payer: Medicaid Other

## 2015-12-01 VITALS — BP 144/84 | HR 81 | Temp 98.9°F | Resp 16 | Wt 171.2 lb

## 2015-12-01 DIAGNOSIS — C50411 Malignant neoplasm of upper-outer quadrant of right female breast: Secondary | ICD-10-CM

## 2015-12-01 DIAGNOSIS — F329 Major depressive disorder, single episode, unspecified: Secondary | ICD-10-CM

## 2015-12-01 DIAGNOSIS — Z171 Estrogen receptor negative status [ER-]: Secondary | ICD-10-CM | POA: Diagnosis not present

## 2015-12-01 DIAGNOSIS — Z95828 Presence of other vascular implants and grafts: Secondary | ICD-10-CM

## 2015-12-01 LAB — COMPREHENSIVE METABOLIC PANEL
ALBUMIN: 3.8 g/dL (ref 3.5–5.0)
ALT: 16 U/L (ref 14–54)
ANION GAP: 6 (ref 5–15)
AST: 19 U/L (ref 15–41)
Alkaline Phosphatase: 77 U/L (ref 38–126)
BUN: 16 mg/dL (ref 6–20)
CO2: 28 mmol/L (ref 22–32)
Calcium: 9 mg/dL (ref 8.9–10.3)
Chloride: 103 mmol/L (ref 101–111)
Creatinine, Ser: 0.42 mg/dL — ABNORMAL LOW (ref 0.44–1.00)
GFR calc non Af Amer: >60 mL/min (ref >60)
GLUCOSE: 206 mg/dL — AB (ref 65–99)
POTASSIUM: 3.8 mmol/L (ref 3.5–5.1)
SODIUM: 137 mmol/L (ref 135–145)
Total Bilirubin: 0.6 mg/dL (ref 0.3–1.2)
Total Protein: 6.4 g/dL — ABNORMAL LOW (ref 6.5–8.1)

## 2015-12-01 LAB — CBC WITH DIFFERENTIAL/PLATELET
BASOS PCT: 0 %
Basophils Absolute: 0 10*3/uL (ref 0.0–0.1)
EOS ABS: 0 10*3/uL (ref 0.0–0.7)
EOS PCT: 0 %
HCT: 33.6 % — ABNORMAL LOW (ref 36.0–46.0)
Hemoglobin: 10.5 g/dL — ABNORMAL LOW (ref 12.0–15.0)
Lymphocytes Relative: 14 %
Lymphs Abs: 1.1 10*3/uL (ref 0.7–4.0)
MCH: 26.7 pg (ref 26.0–34.0)
MCHC: 31.3 g/dL (ref 30.0–36.0)
MCV: 85.5 fL (ref 78.0–100.0)
MONO ABS: 0.6 10*3/uL (ref 0.1–1.0)
MONOS PCT: 8 %
NEUTROS PCT: 78 %
Neutro Abs: 6.2 10*3/uL (ref 1.7–7.7)
PLATELETS: 282 10*3/uL (ref 150–400)
RBC: 3.93 MIL/uL (ref 3.87–5.11)
RDW: 16.6 % — AB (ref 11.5–15.5)
WBC: 7.9 10*3/uL (ref 4.0–10.5)

## 2015-12-01 MED ORDER — HEPARIN SOD (PORK) LOCK FLUSH 100 UNIT/ML IV SOLN
INTRAVENOUS | Status: AC
  Filled 2015-12-01: qty 5

## 2015-12-01 MED ORDER — HEPARIN SOD (PORK) LOCK FLUSH 100 UNIT/ML IV SOLN
500.0000 [IU] | Freq: Once | INTRAVENOUS | Status: AC
  Administered 2015-12-01: 500 [IU] via INTRAVENOUS

## 2015-12-01 MED ORDER — SODIUM CHLORIDE 0.9% FLUSH
10.0000 mL | INTRAVENOUS | Status: DC | PRN
  Administered 2015-12-01: 10 mL via INTRAVENOUS
  Filled 2015-12-01: qty 10

## 2015-12-01 NOTE — Progress Notes (Signed)
[place]  PROGRESS NOTE  Patient Care Team: [name], MD as PCP - General (Unknown Physician Specialty)  CHIEF COMPLAINTS/PURPOSE OF CONSULTATION:     Breast cancer of upper-outer quadrant of right female breast ([place])   04/29/2015 Initial Biopsy    upper outer quadrant mass R breast 2.9 cm by ultrasound, high grade invasive ductal carcinoma ER< 1%, PR 1%, HER -2 not amplified, Ki-67 66%      05/13/2015 Cancer Staging    pT1cN0      05/13/2015 Surgery    lumpectomy and sentinel node biopsies X 3, 2 cm tumor, 3 negative nodes       05/21/2015 Imaging    CT [facility] with postsurgical changes related to R breast lympectomy and R axillary LN dissection, no findings suspicious for metastatic disease      06/12/2015 Procedure    Port placement by Dr. Anthony Sar      06/17/2015 Imaging    MUGA- Normal left ventricular ejection fraction equal to 62.9%.      06/25/2015 - 08/13/2015 Chemotherapy    AC x 4      08/27/2015 - 09/03/2015 Chemotherapy    Weekly Taxol      09/11/2015 - 09/15/2015 Hospital Admission    Neutropenic fever, ARF secondary to antibiotics      10/01/2015 -  Chemotherapy    Taxotere 75 mg/m2 Q3 weeks         HISTORY OF PRESENTING ILLNESS:  Brittney [name] 45 y.o. 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  Patient has an appointment with Dr. Lisbeth Renshaw on Thursday. Brittney [name] is anxious to begin XRT.   Brittney [name] Brittney [name] has felt generally well since completing chemotherapy. However, Brittney [name] is still experiencing pain in her legs. Pain medication offers a brief relief. Pain is localized to knees but can radiate to hips if Brittney [name] sits for too long. Cold weather aggravates symptoms. Brittney [name] has experienced knee pain for years, but treatment exacerbated symptoms. Brittney [name] is also experiencing numbness in the back of her arm from surgery, but is now also starting to experience a  "funny sensation" around incision site.   Brittney [name] has not had blood workup yet today.  Patient asked when Brittney [name] would have port removed.  No other complaints.  Mood is improving. Energy is ok. No nausea or vomiting.   MEDICAL HISTORY:  Past Medical History:  Diagnosis Date  . Breast cancer ([place])   . Breast cancer of upper-outer quadrant of right female breast ([place]) 06/11/2015   Triple negative, 2 cm   . Chemotherapy induced neutropenia ([place]) 09/10/2015  . Diabetes mellitus ([place])    Type 1 over 15 yrs  . Environmental allergies   . GERD (gastroesophageal reflux disease)   . Iron deficiency anemia due to chronic blood loss 06/12/2015  . 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 CESAREAN SECTION    . COLONOSCOPY N/A 10/09/2013   Procedure: COLONOSCOPY;  Surgeon: Rogene Houston, MD;  Location[facility];  Service: Endoscopy;  Laterality: N/A;  100  . DILATION AND CURETTAGE OF UTERUS     x 2 for miscarriages  . ESOPHAGOGASTRODUODENOSCOPY    . FLEXIBLE SIGMOIDOSCOPY    . FLEXIBLE SIGMOIDOSCOPY  07/27/2011   Procedure: FLEXIBLE SIGMOIDOSCOPY;  Surgeon: Rogene Houston, MD;  Location: [facility];  Service: Endoscopy;  Laterality: N/A;  100    SOCIAL HISTORY: Social History   Social History  . Marital status: Single    Spouse name: N/A  . Number of children: N/A  . Years of education: N/A   Occupational History  . Not on file.   Social History Main Topics  . Smoking status: Never Smoker  . Smokeless tobacco: Never Used  . Alcohol use No  . Drug use: No  . Sexual activity: Not on file   Other Topics Concern  . Not on file   Social History Narrative  . No narrative on file   Brittney [name] is single. Has a 94 year old daughter. Aged 25. Works in home health care. Also going to school. Has to sit out her very last term in order to do her chemotherapy. Working toward RNA/CNA.   Brittney [name] does not smoke.  Brittney [name] loves to read, spend time with her family. Brittney [name] used to like to shop but doesnm't have a lot of money to spend now, so it's at the bottom of the list.   FAMILY HISTORY: Family History  Problem Relation Age of Onset  . Non-Hodgkin's lymphoma Mother   . Hypertension Mother   . Diabetes Mellitus II Mother   . Hypertension Father   . Diabetes Mellitus II Father   . Colon cancer Cousin   . Breast cancer Maternal Aunt    Mom diagnosed in 2013 with non-hodgkin's lymphoma. Had chemotherapy, radiation, more chemotherapy, and stem-cell at [facility] using her cells. Her mother has been in remission since. Mother is aged 12 this month (June 20th, 2017). Father is 31 as well; has high blood pressure. PTSD. Has been treated at the [facility] in [place]. Brittney [name] believes he has high cholesterol too, like her mother. Otherwise, he is "alright I guess."  Has an older brother, and a younger sister. No one else in her immediate family with cancer.   ALLERGIES:  is allergic to ace inhibitors; adhesive [tape]; pravastatin sodium; statins; peanuts [peanut oil]; and shellfish allergy.  MEDICATIONS:  Current Outpatient Prescriptions  Medication Sig Dispense Refill  . acetaminophen (TYLENOL) 500 MG tablet Take 1,000 mg by mouth every 6 (six) hours as needed. For pain    . acyclovir (ZOVIRAX) 200 MG capsule Take 200 mg by mouth 2 (two) times daily. Reported on 07/23/2015    . albuterol (PROVENTIL HFA;VENTOLIN HFA) 108 (90 Base) MCG/ACT inhaler Inhale into the lungs every 6 (six) hours as needed for wheezing or shortness of breath.    . APRISO 0.375 g 24 hr capsule TAKE FOUR (4) CAPSULES BY MOUTH EVERY MORNING 120 capsule 5  . clotrimazole-betamethasone (LOTRISONE) cream Apply 1 application topically 2 (two) times daily. Apply to groin 30 g 0  . cyclobenzaprine (FLEXERIL) 5 MG tablet Take 1 tablet (5 mg total) by mouth 3 (three) times daily as needed for muscle spasms. 60 tablet 1  .  EPINEPHrine (EPIPEN) 0.3 mg/0.3 mL SOAJ injection Inject into the muscle as needed.    [name] FLUoxetine (PROZAC) 40 MG capsule Take 1 capsule (40 mg total) by mouth daily. (Patient taking differently: Take 80 mg by mouth daily. ) 30 capsule 3  . gabapentin (NEURONTIN) 100 MG capsule Take 100 mg by mouth 3 (three) times daily.     [name] HYDROcodone-acetaminophen (NORCO/VICODIN) 5-325 MG tablet TAKE ONE TABLET BY MOUTH EVERY 4 HOURS AS NEEDED FOR MODERATE PAIN 30 tablet 0  . ibuprofen (ADVIL,MOTRIN) 200 MG tablet Take 400 mg by  mouth every 6 (six) hours as needed for moderate pain.    [name] insulin glargine (LANTUS) 100 UNIT/ML injection Inject 20 Units into the skin at bedtime.     . insulin lispro protamine-insulin lispro (HUMALOG 50/50) (50-50) 100 UNIT/ML SUSP Inject 10 Units into the skin 4 (four) times daily. With each meal    . lidocaine-prilocaine (EMLA) cream Apply a quarter size amount to port site 1 hour prior to chemo. Do not rub in. Cover with plastic wrap. 30 g 3  . loratadine (CLARITIN) 10 MG tablet TAKE ONE TABLET BY MOUTH DAILY FOR HIVES.  6  . LORazepam (ATIVAN) 0.5 MG tablet Take 1 tablet (0.5 mg total) by mouth every 8 (eight) hours. For nausea or anxiety 45 tablet 0  . Melatonin 5 MG TABS Take 5 mg by mouth at bedtime.    . mometasone (NASONEX) 50 MCG/ACT nasal spray Place 2 sprays into the nose daily as needed.     . mupirocin ointment (BACTROBAN) 2 % Place 1 application into the nose 2 (two) times daily. (Patient taking differently: Place 1 application into the nose 2 (two) times daily as needed (irritation). ) 22 g 0  . omeprazole (PRILOSEC) 20 MG capsule Take 20 mg by mouth 2 (two) times daily before a meal.     . ondansetron (ZOFRAN ODT) 8 MG disintegrating tablet Take 1 tablet (8 mg total) by mouth every 8 (eight) hours as needed for nausea or vomiting. 30 tablet 2  . Pegfilgrastim (NEULASTA ONPRO [place]) Inject into the skin. Every 21 days    . polyethylene glycol powder (GLYCOLAX/MIRALAX)  powder Take 8.5 g by mouth daily. (Patient taking differently: Take 8.5 g by mouth daily as needed for mild constipation. ) 255 g 5  . potassium chloride SA (K-DUR,KLOR-CON) 20 MEQ tablet Take 1 tablet (20 mEq total) by mouth daily. 30 tablet 2  . promethazine (PHENERGAN) 25 MG tablet Take 1 tablet (25 mg total) by mouth every 6 (six) hours as needed for nausea or vomiting. 30 tablet 2  . dexamethasone (DECADRON) 4 MG tablet Take 2 tablets (8 mg total) by mouth 2 (two) times daily. Start the day before Taxotere. Then daily after chemo for 2 days. (Patient not taking: Reported on 12/01/2015) 30 tablet 1  . DOCEtaxel (TAXOTERE IV) Inject into the vein. Every 3 weeks    . iron polysaccharides (NIFEREX) 150 MG capsule Take 150 mg by mouth daily.     No current facility-administered medications for this visit.    Facility-Administered Medications Ordered in Other Visits  Medication Dose Route Frequency Provider Last Rate Last Dose  . 0.9 %  sodium chloride infusion   Intravenous Continuous Baird Cancer, PA-C        Review of Systems  Constitutional: Negative.  Negative for chills, fever, malaise/fatigue and weight loss.  HENT: Negative.  Negative for congestion, hearing loss, nosebleeds, sore throat and tinnitus.   Eyes: Negative.  Negative for blurred vision, double vision, pain and discharge.  Respiratory: Negative.  Negative for cough, hemoptysis, sputum production, shortness of breath and wheezing.   Cardiovascular: Negative for claudication, leg swelling and PND.  Gastrointestinal: Negative.  Negative for abdominal pain, blood in stool, constipation, diarrhea, heartburn, melena, nausea and vomiting.  Genitourinary: Negative.  Negative for dysuria, frequency, hematuria and urgency.  Musculoskeletal: Positive for joint pain. Negative for falls and myalgias.       Leg pain, Knee pain, and Hip pain  Skin: Negative.  Negative for itching and  rash.  Neurological: Positive for tingling.  Negative for dizziness, tremors, sensory change, speech change, focal weakness, seizures, loss of consciousness, weakness and headaches.       Tingling sensation around incision site  Endo/Heme/Allergies: Negative.  Does not bruise/bleed easily.  Psychiatric/Behavioral: Negative for depression, substance abuse and suicidal ideas. The patient is not nervous/anxious and does not have insomnia.   All other systems reviewed and are negative. 14 point ROS was done and is otherwise as detailed above or in HPI   PHYSICAL EXAMINATION: ECOG PERFORMANCE STATUS: 1 - Symptomatic but completely ambulatory  Vitals:   12/01/15 0816  BP: (!) 144/84  Pulse: 81  Resp: 16  Temp: 98.9 F (37.2 C)   Filed Weights   12/01/15 0816  Weight: 171 lb 3.2 oz (77.7 kg)    Physical Exam  Constitutional: Brittney [name] is oriented to person, place, and time and well-developed, well-nourished, and in no distress.  alopecia  HENT:  Head: Normocephalic and atraumatic.  Nose: Nose normal.  Mouth/Throat: Oropharynx is clear and moist. No oropharyngeal exudate.  Eyes: Conjunctivae and EOM are normal. Pupils are equal, round, and reactive to light. Right eye exhibits no discharge. Left eye exhibits no discharge. No scleral icterus.  Neck: Normal range of motion. Neck supple. No tracheal deviation present. No thyromegaly present.  Cardiovascular: Normal rate, regular rhythm and normal heart sounds.  Exam reveals no gallop and no friction rub.   No murmur heard. Pulmonary/Chest: Effort normal and breath sounds normal. Brittney [name] has no wheezes. Brittney [name] has no rales.  Abdominal: Soft. Bowel sounds are normal. Brittney [name] exhibits no distension and no mass. There is no tenderness. There is no rebound and no guarding.  Musculoskeletal: Normal range of motion. Brittney [name] exhibits no edema.  Lymphadenopathy:    Brittney [name] has no cervical adenopathy.  Neurological: Brittney [name] is alert and oriented to person, place, and time. Brittney [name] has normal reflexes. No cranial nerve  deficit. Gait normal. Coordination normal.  Skin: Skin is warm and dry. No rash noted.  Psychiatric: Mood, memory, affect and judgment normal.  Nursing note and vitals reviewed.    LABORATORY DATA: I have reviewed the data as listed. Results for EARLEEN, AOUN (MRN [id number]) as of 11/12/2015 12:52  Ref. Range 11/12/2015 10:20  Sodium Latest Ref Range: 135 - 145 mmol/L 136  Potassium Latest Ref Range: 3.5 - 5.1 mmol/L 3.5  Chloride Latest Ref Range: 101 - 111 mmol/L 102  CO2 Latest Ref Range: 22 - 32 mmol/L 26  BUN Latest Ref Range: 6 - 20 mg/dL 16  Creatinine Latest Ref Range: 0.44 - 1.00 mg/dL 0.59  Calcium Latest Ref Range: 8.9 - 10.3 mg/dL 9.6  EGFR (Non-African Amer.) Latest Ref Range: >60 mL/min >60  EGFR (African American) Latest Ref Range: >60 mL/min >60  Glucose Latest Ref Range: 65 - 99 mg/dL 423 (H)  Anion gap Latest Ref Range: 5 - 15  8  Alkaline Phosphatase Latest Ref Range: 38 - 126 U/L 71  Albumin Latest Ref Range: 3.5 - 5.0 g/dL 4.1  AST Latest Ref Range: 15 - 41 U/L 15  ALT Latest Ref Range: 14 - 54 U/L 14  Total Protein Latest Ref Range: 6.5 - 8.1 g/dL 6.9  Total Bilirubin Latest Ref Range: 0.3 - 1.2 mg/dL 0.8  WBC Latest Ref Range: 4.0 - 10.5 K/uL 11.2 (H)  RBC Latest Ref Range: 3.87 - 5.11 MIL/uL 3.90  Hemoglobin Latest Ref Range: 12.0 - 15.0 g/dL 10.6 (L)  HCT Latest Ref Range: 36.0 -  46.0 % 33.0 (L)  MCV Latest Ref Range: 78.0 - 100.0 fL 84.6  MCH Latest Ref Range: 26.0 - 34.0 pg 27.2  MCHC Latest Ref Range: 30.0 - 36.0 g/dL 32.1  RDW Latest Ref Range: 11.5 - 15.5 % 16.6 (H)  Platelets Latest Ref Range: 150 - 400 K/uL 267  Neutrophils Latest Units: % 90  Lymphocytes Latest Units: % 7  Monocytes Relative Latest Units: % 3  Eosinophil Latest Units: % 0  Basophil Latest Units: % 0  NEUT# Latest Ref Range: 1.7 - 7.7 K/uL 10.1 (H)  Lymphocyte # Latest Ref Range: 0.7 - 4.0 K/uL 0.8  Monocyte # Latest Ref Range: 0.1 - 1.0 K/uL 0.3  Eosinophils Absolute  Latest Ref Range: 0.0 - 0.7 K/uL 0.0  Basophils Absolute Latest Ref Range: 0.0 - 0.1 K/uL 0.0   RADIOGRAPHY: I have reviewed the data as listed below. Study Result   CLINICAL DATA:  Right breast cancer diagnosed April 2017. Headaches.  EXAM: CT HEAD WITHOUT AND WITH CONTRAST  TECHNIQUE: Contiguous axial images were obtained from the base of the skull through the vertex without and with intravenous contrast  CONTRAST:  27m ISOVUE-300 IOPAMIDOL intravenous  COMPARISON:  08/15/2011  FINDINGS: Brain: No evidence of acute infarction, hemorrhage, hydrocephalus, extra-axial collection or mass lesion/mass effect.  Vascular: No hyperdense vessel or unexpected calcification. Visible vessels are patent.  Skull: Stable and negative for fracture or focal lesion.  Sinuses/Orbits: Negative  IMPRESSION: No explanation for headache.  No evidence of metastatic disease.   Electronically Signed   By: JMonte FantasiaM.D.   On: 10/29/2015 11:33     PATHOLOGY:   ASSESSMENT & PLAN:  pT1cN0 triple negative carcinoma of the R breast S/P lumpectomy and sentinel LN procedure Iron deficiency Anemia Ulcerative Colitis followed by Dr. RLaural GoldenOngoing Menses Negative [facility] Taxol induced musculoskeletal pain Hidradenitis Chemotherapy induced anemia Depression Steroid induced hyperglycemia Type 1 diabetes  Brittney [name]. Mood is improved on increased prozac. Brittney [name] will continue.  [name]   Patient appears well. Brittney [name] asked about having her port removed. I told Brittney [name] that we generally leave the port in for an extended period of time after completion of therapy unless it is causing her discomfort. However, I will leave that up to her if Brittney [name] desires port removal.   Patient was concerned about a tingling sensation around incision site. I assured her that this is normal due to nerve regeneration  and may never subside completely.   Will follow up with her at the end of radiation.   Orders Placed This Encounter  Procedures  . CBC with Differential    Standing Status:   Future    Standing Expiration Date:   11/30/2016  . Comprehensive metabolic panel    Standing Status:   Future    Standing Expiration Date:   11/30/2016  . CBC with Differential    Standing Status:   Future    Standing Expiration Date:   11/30/2016  . Comprehensive metabolic panel    Standing Status:   Future    Standing Expiration Date:   11/30/2016   All questions were answered. The patient knows to call the clinic with any problems, questions or concerns.  This document serves as a record of services personally performed by SAncil Linsey MD. It was created on her behalf by DElmyra Ricks a trained medical scribe. The creation of this record is based on the  scribe's personal observations and the provider's statements to them. This document has been checked and approved by the attending provider.  I have reviewed the above documentation for accuracy and completeness and I agree with the above.  This note was electronically signed.  Molli Hazard, MD  12/01/2015 8:49 AM

## 2015-12-01 NOTE — Patient Instructions (Addendum)
[place] at [facility] Discharge Instructions  RECOMMENDATIONS MADE BY THE CONSULTANT AND ANY TEST RESULTS WILL BE SENT TO YOUR REFERRING PHYSICIAN.  You saw Dr.Penland today. Labs today. Follow up in 8 weeks with labs. Continue port flushes. See Amy at checkout for appointments.  Thank you for choosing [place] at [facility] to provide your oncology and hematology care.  To afford each patient quality time with our provider, please arrive at least 15 minutes before your scheduled appointment time.   Beginning January 23rd 2017 lab work for the [facility] will be done in the  Main lab at [facility] on 1st floor. If you have a lab appointment with the [facility] please come in thru the  Main Entrance and check in at the main information desk  You need to re-schedule your appointment should you arrive 10 or more minutes late.  We strive to give you quality time with our providers, and arriving late affects you and other patients whose appointments are after yours.  Also, if you no show three or more times for appointments you may be dismissed from the clinic at the providers discretion.     Again, thank you for choosing [facility].  Our hope is that these requests will decrease the amount of time that you wait before being seen by our physicians.       _____________________________________________________________  Should you have questions after your visit to [facility], please contact our office at [phone] between the hours of 8:30 a.m. and 4:30 p.m.  Voicemails left after 4:30 p.m. will not be returned until the following business day.  For prescription refill requests, have your pharmacy contact our office.         Resources For Cancer Patients and their Caregivers ? American Cancer Society: Can assist with transportation, wigs, general needs, runs Look Good Feel Better.         878-077-8737 ? Cancer Care: Provides financial assistance, online support groups, medication/co-pay assistance.  1-800-813-HOPE 434-471-1735) ? [place] Assists [place] cancer patients and their families through emotional , educational and financial support.  620 416 8811 ? [place] DSS Where to apply for food stamps, Medicaid and utility assistance. (715) 460-3144 ? [facility]: Transportation to medical appointments. (580)078-6766 ? Social Security Administration: May apply for disability if have a Stage IV cancer. (616)738-6111 219-255-2723 ? [employer], Disability and Transit Services: Assists with nutrition, care and transit needs. [place] Support Programs: @10RELATIVEDAYS @ > Cancer Support Group  2nd Tuesday of the month 1pm-2pm, Journey Room  > Creative Journey  3rd Tuesday of the month 1130am-1pm, Journey Room  > Look Good Feel Better  1st Wednesday of the month 10am-12 noon, Journey Room (Call [place] to register [phone])

## 2015-12-01 NOTE — Progress Notes (Signed)
Brittney [name] presented for Portacath access and flush.   Portacath located left chest wall accessed with  H 22 needle.  Sluggish blood return noted, pt repositioned, good blood return noted after that. Labs drawn and sent to lab. Portacath flushed with 58m NS and 500U/567mHeparin and needle removed intact.  Procedure tolerated well and without incident.   Pt stable and discharged home ambulatory.

## 2015-12-24 ENCOUNTER — Encounter (HOSPITAL_COMMUNITY): Payer: Self-pay | Admitting: Hematology & Oncology

## 2015-12-31 ENCOUNTER — Other Ambulatory Visit (HOSPITAL_COMMUNITY): Payer: Self-pay | Admitting: Emergency Medicine

## 2016-01-07 ENCOUNTER — Encounter (HOSPITAL_COMMUNITY): Payer: Medicaid Other

## 2016-01-12 ENCOUNTER — Telehealth (HOSPITAL_COMMUNITY): Payer: Self-pay | Admitting: *Deleted

## 2016-01-12 NOTE — Telephone Encounter (Signed)
-----   Message from Louis Meckel sent at 01/12/2016  1:31 PM EST ----- Regarding: pain in port Patient having pain in port area due to moving a bed.  Please call patient to talk to her about. [phone]  Thanks

## 2016-01-12 NOTE — Telephone Encounter (Signed)
Pt states that she moved a bed the other day and started having left shoulder pain that radiated to her neck. Pt stated that she felt the pain near her port and was worried that something happened to her port. The pt stated that she had gone to her PCP at the end of last week and they gave her an injection and did an xray. She was advised to give it a few days and she should be fine. Pt states that she was feeling better and then the pain started again after taking a shower.   I spoke with Kirby Crigler PA-C and he advised that it is probably muscle related but that if the pt is worried about her port she could contact Dr. Anthony Sar and see if he thinks it should be checked out.   I advised the pt of above and she verbalized understanding.

## 2016-01-12 NOTE — Telephone Encounter (Signed)
-----   Message from Louis Meckel sent at 01/12/2016  1:31 PM EST ----- Regarding: pain in port Patient having pain in port area due to moving a bed.  Please call patient to talk to her about. [phone]  Thanks

## 2016-01-25 ENCOUNTER — Encounter (HOSPITAL_COMMUNITY): Payer: Medicaid Other | Attending: Hematology & Oncology | Admitting: Hematology & Oncology

## 2016-01-25 ENCOUNTER — Encounter (HOSPITAL_COMMUNITY): Payer: Self-pay | Admitting: Hematology & Oncology

## 2016-01-25 ENCOUNTER — Encounter (HOSPITAL_BASED_OUTPATIENT_CLINIC_OR_DEPARTMENT_OTHER): Payer: Medicaid Other

## 2016-01-25 ENCOUNTER — Other Ambulatory Visit (HOSPITAL_COMMUNITY): Payer: Medicaid Other

## 2016-01-25 VITALS — BP 148/90 | HR 71 | Temp 98.1°F | Resp 16 | Wt 168.0 lb

## 2016-01-25 DIAGNOSIS — E109 Type 1 diabetes mellitus without complications: Secondary | ICD-10-CM | POA: Diagnosis not present

## 2016-01-25 DIAGNOSIS — L732 Hidradenitis suppurativa: Secondary | ICD-10-CM | POA: Insufficient documentation

## 2016-01-25 DIAGNOSIS — E119 Type 2 diabetes mellitus without complications: Secondary | ICD-10-CM | POA: Diagnosis not present

## 2016-01-25 DIAGNOSIS — Z171 Estrogen receptor negative status [ER-]: Secondary | ICD-10-CM

## 2016-01-25 DIAGNOSIS — Z91013 Allergy to seafood: Secondary | ICD-10-CM | POA: Insufficient documentation

## 2016-01-25 DIAGNOSIS — C50919 Malignant neoplasm of unspecified site of unspecified female breast: Secondary | ICD-10-CM

## 2016-01-25 DIAGNOSIS — Z888 Allergy status to other drugs, medicaments and biological substances status: Secondary | ICD-10-CM | POA: Diagnosis not present

## 2016-01-25 DIAGNOSIS — D701 Agranulocytosis secondary to cancer chemotherapy: Secondary | ICD-10-CM | POA: Insufficient documentation

## 2016-01-25 DIAGNOSIS — Z79899 Other long term (current) drug therapy: Secondary | ICD-10-CM | POA: Diagnosis not present

## 2016-01-25 DIAGNOSIS — K5190 Ulcerative colitis, unspecified, without complications: Secondary | ICD-10-CM | POA: Insufficient documentation

## 2016-01-25 DIAGNOSIS — D509 Iron deficiency anemia, unspecified: Secondary | ICD-10-CM | POA: Insufficient documentation

## 2016-01-25 DIAGNOSIS — R200 Anesthesia of skin: Secondary | ICD-10-CM | POA: Insufficient documentation

## 2016-01-25 DIAGNOSIS — C50411 Malignant neoplasm of upper-outer quadrant of right female breast: Secondary | ICD-10-CM

## 2016-01-25 DIAGNOSIS — Z91048 Other nonmedicinal substance allergy status: Secondary | ICD-10-CM | POA: Diagnosis not present

## 2016-01-25 DIAGNOSIS — T380X5A Adverse effect of glucocorticoids and synthetic analogues, initial encounter: Secondary | ICD-10-CM | POA: Insufficient documentation

## 2016-01-25 DIAGNOSIS — F329 Major depressive disorder, single episode, unspecified: Secondary | ICD-10-CM | POA: Insufficient documentation

## 2016-01-25 DIAGNOSIS — Z794 Long term (current) use of insulin: Secondary | ICD-10-CM | POA: Diagnosis not present

## 2016-01-25 DIAGNOSIS — D6481 Anemia due to antineoplastic chemotherapy: Secondary | ICD-10-CM | POA: Diagnosis not present

## 2016-01-25 DIAGNOSIS — Z95828 Presence of other vascular implants and grafts: Secondary | ICD-10-CM

## 2016-01-25 LAB — CBC WITH DIFFERENTIAL/PLATELET
BASOS ABS: 0 10*3/uL (ref 0.0–0.1)
BASOS PCT: 0 %
EOS ABS: 0 10*3/uL (ref 0.0–0.7)
EOS PCT: 0 %
HCT: 36.1 % (ref 36.0–46.0)
HEMOGLOBIN: 11.5 g/dL — AB (ref 12.0–15.0)
Lymphocytes Relative: 31 %
Lymphs Abs: 1 10*3/uL (ref 0.7–4.0)
MCH: 26 pg (ref 26.0–34.0)
MCHC: 31.9 g/dL (ref 30.0–36.0)
MCV: 81.5 fL (ref 78.0–100.0)
Monocytes Absolute: 0.3 10*3/uL (ref 0.1–1.0)
Monocytes Relative: 11 %
NEUTROS PCT: 58 %
Neutro Abs: 1.9 10*3/uL (ref 1.7–7.7)
PLATELETS: 196 10*3/uL (ref 150–400)
RBC: 4.43 MIL/uL (ref 3.87–5.11)
RDW: 14.7 % (ref 11.5–15.5)
WBC: 3.2 10*3/uL — AB (ref 4.0–10.5)

## 2016-01-25 LAB — COMPREHENSIVE METABOLIC PANEL
ALBUMIN: 3.8 g/dL (ref 3.5–5.0)
ALT: 17 U/L (ref 14–54)
AST: 22 U/L (ref 15–41)
Alkaline Phosphatase: 83 U/L (ref 38–126)
Anion gap: 9 (ref 5–15)
BUN: 10 mg/dL (ref 6–20)
CHLORIDE: 101 mmol/L (ref 101–111)
CO2: 28 mmol/L (ref 22–32)
CREATININE: 0.53 mg/dL (ref 0.44–1.00)
Calcium: 8.8 mg/dL — ABNORMAL LOW (ref 8.9–10.3)
GFR calc non Af Amer: >60 mL/min (ref >60)
GLUCOSE: 71 mg/dL (ref 65–99)
Potassium: 3.6 mmol/L (ref 3.5–5.1)
SODIUM: 138 mmol/L (ref 135–145)
Total Bilirubin: 0.5 mg/dL (ref 0.3–1.2)
Total Protein: 6.7 g/dL (ref 6.5–8.1)

## 2016-01-25 MED ORDER — HEPARIN SOD (PORK) LOCK FLUSH 100 UNIT/ML IV SOLN
500.0000 [IU] | Freq: Once | INTRAVENOUS | Status: AC
  Administered 2016-01-25: 500 [IU] via INTRAVENOUS

## 2016-01-25 MED ORDER — HEPARIN SOD (PORK) LOCK FLUSH 100 UNIT/ML IV SOLN
INTRAVENOUS | Status: AC
  Filled 2016-01-25: qty 5

## 2016-01-25 MED ORDER — SODIUM CHLORIDE 0.9% FLUSH
10.0000 mL | Freq: Once | INTRAVENOUS | Status: AC
  Administered 2016-01-25: 10 mL via INTRAVENOUS

## 2016-01-25 NOTE — Patient Instructions (Signed)
[place] at [facility] Discharge Instructions  RECOMMENDATIONS MADE BY THE CONSULTANT AND ANY TEST RESULTS WILL BE SENT TO YOUR REFERRING PHYSICIAN. You were seen today by Dr. Whitney Muse Follow up in 3 months Mammogram scheduled in April We will have Angie look into CT head denial from insurance. We will get you scheduled for that.  Thank you for choosing [place] at [facility] to provide your oncology and hematology care.  To afford each patient quality time with our provider, please arrive at least 15 minutes before your scheduled appointment time.    If you have a lab appointment with the [facility] please come in thru the  Main Entrance and check in at the main information desk  You need to re-schedule your appointment should you arrive 10 or more minutes late.  We strive to give you quality time with our providers, and arriving late affects you and other patients whose appointments are after yours.  Also, if you no show three or more times for appointments you may be dismissed from the clinic at the providers discretion.     Again, thank you for choosing [facility].  Our hope is that these requests will decrease the amount of time that you wait before being seen by our physicians.       _____________________________________________________________  Should you have questions after your visit to [facility], please contact our office at [phone] between the hours of 8:30 a.m. and 4:30 p.m.  Voicemails left after 4:30 p.m. will not be returned until the following business day.  For prescription refill requests, have your pharmacy contact our office.       Resources For Cancer Patients and their Caregivers ? American Cancer Society: Can assist with transportation, wigs, general needs, runs Look Good Feel Better.        ([phone] ? Cancer Care: Provides financial assistance, online support groups,  medication/co-pay assistance.  1-800-813-HOPE 971-384-7059) ? [place] Assists [place] cancer patients and their families through emotional , educational and financial support.  437-770-9196 ? [place] DSS Where to apply for food stamps, Medicaid and utility assistance. 6098319269 ? RCATS: Transportation to medical appointments. 343-724-4251 ? Social Security Administration: May apply for disability if have a Stage IV cancer. 343 840 8668 (281)431-7524 ? [employer], Disability and Transit Services: Assists with nutrition, care and transit needs. [place] Support Programs: @10RELATIVEDAYS @ > Cancer Support Group  2nd Tuesday of the month 1pm-2pm, Journey Room  > Creative Journey  3rd Tuesday of the month 1130am-1pm, Journey Room  > Look Good Feel Better  1st Wednesday of the month 10am-12 noon, Journey Room (Call [place] to register [phone])

## 2016-01-25 NOTE — Progress Notes (Signed)
Brittney [name] presented for Portacath access and flush. Proper placement of portacath confirmed by CXR. Portacath located left chest wall accessed with  H 20 needle. Good blood return present. Portacath flushed with 55m NS and 500U/565mHeparin and needle removed intact. Procedure without incident. Patient tolerated procedure well.

## 2016-01-25 NOTE — Progress Notes (Signed)
[place]  PROGRESS NOTE  Patient Care Team: Brittney Bis, MD as PCP - General (Unknown Physician Specialty)  CHIEF COMPLAINTS/PURPOSE OF CONSULTATION:     Breast cancer of upper-outer quadrant of right female breast ([place])   04/29/2015 Initial Biopsy    upper outer quadrant mass R breast 2.9 cm by ultrasound, high grade invasive ductal carcinoma ER< 1%, PR 1%, HER -2 not amplified, Ki-67 66%      05/13/2015 Cancer Staging    pT1cN0      05/13/2015 Surgery    lumpectomy and sentinel node biopsies X 3, 2 cm tumor, 3 negative nodes       05/21/2015 Imaging    CT [facility] with postsurgical changes related to R breast lympectomy and R axillary LN dissection, no findings suspicious for metastatic disease      06/12/2015 Procedure    Port placement by Brittney [name]      06/17/2015 Imaging    MUGA- Normal left ventricular ejection fraction equal to 62.9%.      06/25/2015 - 08/13/2015 Chemotherapy    AC x 4      08/27/2015 - 09/03/2015 Chemotherapy    Weekly Taxol      09/11/2015 - 09/15/2015 Hospital Admission    Neutropenic fever, ARF secondary to antibiotics      10/01/2015 -  Chemotherapy    Taxotere 75 mg/m2 Q3 weeks        - 01/14/2016 Radiation Therapy    Radiation in [place]        HISTORY OF PRESENTING ILLNESS:  Brittney [name] 46 y.o. 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  Brittney [name] presents to the cancer center today unaccompanied.  She is done with radiation and is very happy. She finished January 10. Her skin has been peeling a lot since radiation. It is the worst under her arm. She was very tired, but it came and went.   She has not been working again, yet. She has not liked being home so often. She would like to start school again next Semester, but her loan payments have started coming in and she can't pay them yet.  She sees Dr. Olevia Perches PA, Brittney [name], in March.   She has been eating well, but not sleeping well. Her hair has started to come back.  Her mood is fairly good. She notes that things are slowly getting back to normal.   MEDICAL HISTORY:  Past Medical History:  Diagnosis Date  . Breast cancer ([place])   . Breast cancer of upper-outer quadrant of right female breast ([place]) 06/11/2015   Triple negative, 2 cm   . Chemotherapy induced neutropenia ([place]) 09/10/2015  . Diabetes mellitus ([place])    Type 1 over 15 yrs  . Environmental allergies   . GERD (gastroesophageal reflux disease)   . Iron deficiency anemia due to chronic blood loss 06/12/2015  . 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 CESAREAN SECTION    . COLONOSCOPY N/A 10/09/2013   Procedure: COLONOSCOPY;  Surgeon: Rogene Houston, MD;  Location: [facility];  Service: Endoscopy;  Laterality: N/A;  100  . DILATION AND CURETTAGE OF UTERUS     x 2 for miscarriages  . ESOPHAGOGASTRODUODENOSCOPY    . FLEXIBLE SIGMOIDOSCOPY    . FLEXIBLE SIGMOIDOSCOPY  07/27/2011  Procedure: FLEXIBLE SIGMOIDOSCOPY;  Surgeon: Rogene Houston, MD;  Location: [facility];  Service: Endoscopy;  Laterality: N/A;  100    SOCIAL HISTORY: Social History   Social History  . Marital status: Single    Spouse name: N/A  . Number of children: N/A  . Years of education: N/A   Occupational History  . Not on file.   Social History Main Topics  . Smoking status: Never Smoker  . Smokeless tobacco: Never Used  . Alcohol use No  . Drug use: No  . Sexual activity: Not on file   Other Topics Concern  . Not on file   Social History Narrative  . No narrative on file   She is single. Has a 71 year old daughter. Works in home health care. Also going to school. Has to sit out her very last term in order to do her chemotherapy. Working toward RNA/CNA.  She does not smoke.  She loves to  read, spend time with her family. She used to like to shop but doesn't have a lot of money to spend now, so it's at the bottom of the list.   FAMILY HISTORY: Family History  Problem Relation Age of Onset  . Non-Hodgkin's lymphoma Mother   . Hypertension Mother   . Diabetes Mellitus II Mother   . Hypertension Father   . Diabetes Mellitus II Father   . Colon cancer Cousin   . Breast cancer Maternal Aunt    Mom diagnosed in 2013 with non-hodgkin's lymphoma. Had chemotherapy, radiation, more chemotherapy, and stem-cell at [facility] using her cells. Her mother has been in remission since. Mother is aged 87 this month (June 20th, 2017). Father is 21 as well; has high blood pressure. PTSD. Has been treated at the [facility] in [place]. She believes he has high cholesterol too, like her mother. Otherwise, he is "alright I guess."  Has an older brother, and a younger sister. No one else in her immediate family with cancer.   ALLERGIES:  is allergic to ace inhibitors; adhesive [tape]; pravastatin sodium; statins; peanuts [peanut oil]; and shellfish allergy.  MEDICATIONS:  Current Outpatient Prescriptions  Medication Sig Dispense Refill  . acetaminophen (TYLENOL) 500 MG tablet Take 1,000 mg by mouth every 6 (six) hours as needed. For pain    . acyclovir (ZOVIRAX) 200 MG capsule Take 200 mg by mouth 2 (two) times daily. Reported on 07/23/2015    . albuterol (PROVENTIL HFA;VENTOLIN HFA) 108 (90 Base) MCG/ACT inhaler Inhale into the lungs every 6 (six) hours as needed for wheezing or shortness of breath.    . APRISO 0.375 g 24 hr capsule TAKE FOUR (4) CAPSULES BY MOUTH EVERY MORNING 120 capsule 5  . clotrimazole-betamethasone (LOTRISONE) cream Apply 1 application topically 2 (two) times daily. Apply to groin 30 g 0  . cyclobenzaprine (FLEXERIL) 5 MG tablet Take 1 tablet (5 mg total) by mouth 3 (three) times daily as needed for muscle spasms. 60 tablet 1  . dexamethasone (DECADRON) 4 MG tablet  Take 2 tablets (8 mg total) by mouth 2 (two) times daily. Start the day before Taxotere. Then daily after chemo for 2 days. (Patient not taking: Reported on 12/01/2015) 30 tablet 1  . DOCEtaxel (TAXOTERE IV) Inject into the vein. Every 3 weeks    . EPINEPHrine (EPIPEN) 0.3 mg/0.3 mL SOAJ injection Inject into the muscle as needed.    [name] FLUoxetine (PROZAC) 40 MG capsule Take 1 capsule (40 mg total) by mouth daily. (Patient  taking differently: Take 80 mg by mouth daily. ) 30 capsule 3  . gabapentin (NEURONTIN) 100 MG capsule Take 100 mg by mouth 3 (three) times daily.     [name] HYDROcodone-acetaminophen (NORCO/VICODIN) 5-325 MG tablet TAKE ONE TABLET BY MOUTH EVERY 4 HOURS AS NEEDED FOR MODERATE PAIN 30 tablet 0  . ibuprofen (ADVIL,MOTRIN) 200 MG tablet Take 400 mg by mouth every 6 (six) hours as needed for moderate pain.    [name] insulin glargine (LANTUS) 100 UNIT/ML injection Inject 20 Units into the skin at bedtime.     . insulin lispro protamine-insulin lispro (HUMALOG 50/50) (50-50) 100 UNIT/ML SUSP Inject 10 Units into the skin 4 (four) times daily. With each meal    . iron polysaccharides (NIFEREX) 150 MG capsule Take 150 mg by mouth daily.    [name] lidocaine-prilocaine (EMLA) cream Apply a quarter size amount to port site 1 hour prior to chemo. Do not rub in. Cover with plastic wrap. 30 g 3  . loratadine (CLARITIN) 10 MG tablet TAKE ONE TABLET BY MOUTH DAILY FOR HIVES.  6  . LORazepam (ATIVAN) 0.5 MG tablet Take 1 tablet (0.5 mg total) by mouth every 8 (eight) hours. For nausea or anxiety 45 tablet 0  . Melatonin 5 MG TABS Take 5 mg by mouth at bedtime.    . mometasone (NASONEX) 50 MCG/ACT nasal spray Place 2 sprays into the nose daily as needed.     . mupirocin ointment (BACTROBAN) 2 % Place 1 application into the nose 2 (two) times daily. (Patient taking differently: Place 1 application into the nose 2 (two) times daily as needed (irritation). ) 22 g 0  . omeprazole (PRILOSEC) 20 MG capsule Take 20 mg by  mouth 2 (two) times daily before a meal.     . ondansetron (ZOFRAN ODT) 8 MG disintegrating tablet Take 1 tablet (8 mg total) by mouth every 8 (eight) hours as needed for nausea or vomiting. 30 tablet 2  . Pegfilgrastim (NEULASTA ONPRO [place]) Inject into the skin. Every 21 days    . polyethylene glycol powder (GLYCOLAX/MIRALAX) powder Take 8.5 g by mouth daily. (Patient taking differently: Take 8.5 g by mouth daily as needed for mild constipation. ) 255 g 5  . potassium chloride SA (K-DUR,KLOR-CON) 20 MEQ tablet Take 1 tablet (20 mEq total) by mouth daily. 30 tablet 2  . promethazine (PHENERGAN) 25 MG tablet Take 1 tablet (25 mg total) by mouth every 6 (six) hours as needed for nausea or vomiting. 30 tablet 2   No current facility-administered medications for this visit.    Facility-Administered Medications Ordered in Other Visits  Medication Dose Route Frequency Provider Last Rate Last Dose  . 0.9 %  sodium chloride infusion   Intravenous Continuous Manon Hilding Kefalas, PA-C      . heparin lock flush 100 unit/mL  500 Units Intravenous Once Patrici Ranks, MD      . sodium chloride flush (NS) 0.9 % injection 10 mL  10 mL Intravenous Once Patrici Ranks, MD        Review of Systems  Constitutional: Positive for malaise/fatigue (comes and goes).  HENT: Negative.   Eyes: Negative.   Respiratory: Negative.   Cardiovascular: Negative.   Gastrointestinal: Negative.   Genitourinary: Negative.   Musculoskeletal: Negative.   Skin:       Peeling from radiation.   Neurological: Negative.   Endo/Heme/Allergies: Negative.   Psychiatric/Behavioral: The patient has insomnia.   All other systems reviewed and are negative.  14 point ROS was done and is otherwise as detailed above or in HPI   PHYSICAL EXAMINATION: ECOG PERFORMANCE STATUS: 1 - Symptomatic but completely ambulatory  Vitals:   01/25/16 1037  BP: (!) 148/90  Pulse: 71  Resp: 16  Temp: 98.1 F (36.7 C)   Filed Weights    01/25/16 1037  Weight: 168 lb (76.2 kg)   Physical Exam  Constitutional: She is oriented to person, place, and time and well-developed, well-nourished, and in no distress.  Alopecia. Pt was able to get on exam table without assistance.   HENT:  Head: Normocephalic and atraumatic.  Nose: Nose normal.  Mouth/Throat: Oropharynx is clear and moist. No oropharyngeal exudate.  Eyes: Conjunctivae and EOM are normal. Pupils are equal, round, and reactive to light. Right eye exhibits no discharge. Left eye exhibits no discharge. No scleral icterus.  Neck: Normal range of motion. Neck supple. No tracheal deviation present. No thyromegaly present.  Cardiovascular: Normal rate, regular rhythm and normal heart sounds.  Exam reveals no gallop and no friction rub.   No murmur heard. Pulmonary/Chest: Effort normal and breath sounds normal. She has no wheezes. She has no rales.  Abdominal: Soft. Bowel sounds are normal. She exhibits no distension and no mass. There is no tenderness. There is no rebound and no guarding.  Musculoskeletal: Normal range of motion. She exhibits no edema.  Lymphadenopathy:    She has no cervical adenopathy.  Neurological: She is alert and oriented to person, place, and time. She has normal reflexes. No cranial nerve deficit. Gait normal. Coordination normal.  Skin: Skin is warm and dry. No rash noted.  Psychiatric: Mood, memory, affect and judgment normal.  Nursing note and vitals reviewed.  LABORATORY DATA: I have reviewed the data as listed. Results for SEDONIA, KITNER (MRN [id number]) as of 01/25/2016 10:31  Ref. Range 01/25/2016 10:49  Sodium Latest Ref Range: 135 - 145 mmol/L 138  Potassium Latest Ref Range: 3.5 - 5.1 mmol/L 3.6  Chloride Latest Ref Range: 101 - 111 mmol/L 101  CO2 Latest Ref Range: 22 - 32 mmol/L 28  Glucose Latest Ref Range: 65 - 99 mg/dL 71  BUN Latest Ref Range: 6 - 20 mg/dL 10  Creatinine Latest Ref Range: 0.44 - 1.00 mg/dL 0.53  Calcium  Latest Ref Range: 8.9 - 10.3 mg/dL 8.8 (L)  Anion gap Latest Ref Range: 5 - 15  9  Alkaline Phosphatase Latest Ref Range: 38 - 126 U/L 83  Albumin Latest Ref Range: 3.5 - 5.0 g/dL 3.8  AST Latest Ref Range: 15 - 41 U/L 22  ALT Latest Ref Range: 14 - 54 U/L 17  Total Protein Latest Ref Range: 6.5 - 8.1 g/dL 6.7  Total Bilirubin Latest Ref Range: 0.3 - 1.2 mg/dL 0.5  EGFR (African American) Latest Ref Range: >60 mL/min >60  EGFR (Non-African Amer.) Latest Ref Range: >60 mL/min >60  WBC Latest Ref Range: 4.0 - 10.5 K/uL 3.2 (L)  RBC Latest Ref Range: 3.87 - 5.11 MIL/uL 4.43  Hemoglobin Latest Ref Range: 12.0 - 15.0 g/dL 11.5 (L)  HCT Latest Ref Range: 36.0 - 46.0 % 36.1  MCV Latest Ref Range: 78.0 - 100.0 fL 81.5  MCH Latest Ref Range: 26.0 - 34.0 pg 26.0  MCHC Latest Ref Range: 30.0 - 36.0 g/dL 31.9  RDW Latest Ref Range: 11.5 - 15.5 % 14.7  Platelets Latest Ref Range: 150 - 400 K/uL 196  Neutrophils Latest Units: % 58  Lymphocytes Latest Units: % 31  Monocytes Relative Latest Units: % 11  Eosinophil Latest Units: % 0  Basophil Latest Units: % 0  NEUT# Latest Ref Range: 1.7 - 7.7 K/uL 1.9  Lymphocyte # Latest Ref Range: 0.7 - 4.0 K/uL 1.0  Monocyte # Latest Ref Range: 0.1 - 1.0 K/uL 0.3  Eosinophils Absolute Latest Ref Range: 0.0 - 0.7 K/uL 0.0  Basophils Absolute Latest Ref Range: 0.0 - 0.1 K/uL 0.0    RADIOGRAPHY: I have reviewed the data as listed below. Study Result   CLINICAL DATA:  Right breast cancer diagnosed April 2017. Headaches.  EXAM: CT HEAD WITHOUT AND WITH CONTRAST  TECHNIQUE: Contiguous axial images were obtained from the base of the skull through the vertex without and with intravenous contrast  CONTRAST:  19m ISOVUE-300 IOPAMIDOL intravenous  COMPARISON:  08/15/2011  FINDINGS: Brain: No evidence of acute infarction, hemorrhage, hydrocephalus, extra-axial collection or mass lesion/mass effect.  Vascular: No hyperdense vessel or unexpected  calcification. Visible vessels are patent.  Skull: Stable and negative for fracture or focal lesion.  Sinuses/Orbits: Negative  IMPRESSION: No explanation for headache.  No evidence of metastatic disease.   Electronically Signed   By: JMonte FantasiaM.D.   On: 10/29/2015 11:33     PATHOLOGY:   ASSESSMENT & PLAN:  pT1cN0 triple negative carcinoma of the R breast S/P lumpectomy and sentinel LN procedure Iron deficiency Anemia Ulcerative Colitis followed by Dr. RLaural GoldenOngoing Menses Negative Genetic [facility] Taxol induced musculoskeletal pain Hidradenitis Chemotherapy induced anemia Depression Steroid induced hyperglycemia Type 1 diabetes  She is doing better. Mood is improved on increased prozac. She will continue.  She has completed XRT. She had triple negative disease. She understands that there is currently no indication for endocrine therapy.   Patient appears well. She asked about having her port removed. I told Brittney [name] that we generally leave the port in for an extended period of time after completion of therapy unless it is causing her discomfort. However, I will leave that up to her if she desires port removal.   She is due for her diagnostic mammogram soon. Order a 3D [facility] for April/May. [facility]   I will talk to Angie about her head CT. She notes that she received a letter stating that this was denied. I have asked her to bring the letter in.   She will return for a follow up in 3 months.   Orders Placed This Encounter  Procedures  . MM DIAG BREAST TOMO BILATERAL    Standing Status:   Future    Standing Expiration Date:   03/26/2017    Order Specific Question:   Reason for Exam (SYMPTOM  OR DIAGNOSIS REQUIRED)    Answer:   breast cancer    Order Specific Question:   [facility]   Order Specific Question:   Is the patient pregnant?     Answer:   No   All questions were answered. The patient knows to call the clinic with any problems, questions or concerns.  This document serves as a record of services personally performed by SAncil Linsey MD. It was created on her behalf by [email or web address], a trained medical scribe. The creation of this record is based on the scribe's personal observations and the provider's statements to them. This document has been checked and approved by the attending provider.  I have reviewed the above documentation for accuracy  and completeness and I agree with the above.  This note was electronically signed.  Molli Hazard, MD  01/25/2016 10:41 AM

## 2016-02-03 ENCOUNTER — Encounter (HOSPITAL_COMMUNITY): Payer: Self-pay | Admitting: Hematology & Oncology

## 2016-03-15 ENCOUNTER — Encounter (HOSPITAL_COMMUNITY): Payer: Self-pay

## 2016-03-15 ENCOUNTER — Encounter (HOSPITAL_COMMUNITY): Payer: Medicaid Other | Attending: Oncology

## 2016-03-15 VITALS — BP 142/96 | HR 83 | Temp 98.0°F | Resp 16

## 2016-03-15 DIAGNOSIS — Z452 Encounter for adjustment and management of vascular access device: Secondary | ICD-10-CM

## 2016-03-15 DIAGNOSIS — C50411 Malignant neoplasm of upper-outer quadrant of right female breast: Secondary | ICD-10-CM

## 2016-03-15 MED ORDER — HEPARIN SOD (PORK) LOCK FLUSH 100 UNIT/ML IV SOLN: 500.0000 [IU] | Freq: Once | INTRAVENOUS | Status: DC

## 2016-03-15 MED ORDER — HEPARIN SOD (PORK) LOCK FLUSH 100 UNIT/ML IV SOLN
INTRAVENOUS | Status: AC
Start: 2016-03-15 — End: 2016-03-15
  Filled 2016-03-15: qty 5

## 2016-03-15 MED ORDER — SODIUM CHLORIDE 0.9% FLUSH: 20.0000 mL | INTRAVENOUS | Status: DC | PRN

## 2016-03-15 NOTE — Patient Instructions (Signed)
[place] at [facility] Discharge Instructions  RECOMMENDATIONS MADE BY THE CONSULTANT AND ANY TEST RESULTS WILL BE SENT TO YOUR REFERRING PHYSICIAN.  Attempted port flush today.  Please see Amy for appointment for dye study of port.    Thank you for choosing [place] at [facility] to provide your oncology and hematology care.  To afford each patient quality time with our provider, please arrive at least 15 minutes before your scheduled appointment time.    If you have a lab appointment with the [facility] please come in thru the  Main Entrance and check in at the main information desk  You need to re-schedule your appointment should you arrive 10 or more minutes late.  We strive to give you quality time with our providers, and arriving late affects you and other patients whose appointments are after yours.  Also, if you no show three or more times for appointments you may be dismissed from the clinic at the providers discretion.     Again, thank you for choosing [facility].  Our hope is that these requests will decrease the amount of time that you wait before being seen by our physicians.       _____________________________________________________________  Should you have questions after your visit to [facility], please contact our office at [phone] between the hours of 8:30 a.m. and 4:30 p.m.  Voicemails left after 4:30 p.m. will not be returned until the following business day.  For prescription refill requests, have your pharmacy contact our office.       Resources For Cancer Patients and their Caregivers ? American Cancer Society: Can assist with transportation, wigs, general needs, runs Look Good Feel Better.        ([phone] [phone] ? Cancer Care: Provides financial assistance, online support groups, medication/co-pay assistance.  1-800-813-HOPE 425-520-2119) ? [place] Assists [place] cancer patients and their families through emotional , educational and financial support.  ([phone] ? [place] DSS Where to apply for food stamps, Medicaid and utility assistance. 279 739 4166 ? [employer]: Transportation to medical appointments. (865) [phone] ? Social Security Administration: May apply for disability if have a Stage IV cancer. ([phone] 813-565-0653 ? [employer], Disability and Transit Services: Assists with nutrition, care and transit needs. [place] Support Programs: @10RELATIVEDAYS @ > Cancer Support Group  2nd Tuesday of the month 1pm-2pm, Journey Room  > Creative Journey  3rd Tuesday of the month 1130am-1pm, Journey Room  > Look Good Feel Better  1st Wednesday of the month 10am-12 noon, Journey Room (Call [place] to register [phone])

## 2016-03-15 NOTE — Progress Notes (Signed)
Port accessed per protocol.  Upon accessing the port, dark brown return came into the line with minimal pressure, but without the need to aspirate.  Unable to flush the port with saline at all due to the resistance and continued to get dark brown tinged return but unable to aspirate more than 1cc due to resistance.  Another nurse brought to the room for a second opinion and she had the same findings.  Port was then re-accessed with a new kit and the same findings once again.  Mike Craze, NP made aware and she ordered for the patient to have a dye study, which she was then scheduled for, and based on the findings of this study she may have to return to the surgeon to possibly have it removed.  Patient aware and verbalized understanding of care plan.

## 2016-03-16 ENCOUNTER — Encounter (INDEPENDENT_AMBULATORY_CARE_PROVIDER_SITE_OTHER): Payer: Self-pay | Admitting: Internal Medicine

## 2016-03-16 ENCOUNTER — Ambulatory Visit (INDEPENDENT_AMBULATORY_CARE_PROVIDER_SITE_OTHER): Payer: Medicaid Other | Admitting: Internal Medicine

## 2016-03-16 ENCOUNTER — Ambulatory Visit (HOSPITAL_COMMUNITY)
Admission: RE | Admit: 2016-03-16 | Discharge: 2016-03-16 | Disposition: A | Discharge status: Home / Self Care | Payer: Medicaid Other | Source: Ambulatory Visit | Attending: Adult Health | Admitting: Adult Health

## 2016-03-16 VITALS — BP 130/86 | HR 64 | Temp 98.3°F | Ht 61.0 in | Wt 168.0 lb

## 2016-03-16 DIAGNOSIS — Y838 Other surgical procedures as the cause of abnormal reaction of the patient, or of later complication, without mention of misadventure at the time of the procedure: Secondary | ICD-10-CM | POA: Diagnosis not present

## 2016-03-16 DIAGNOSIS — K5120 Ulcerative (chronic) proctitis without complications: Secondary | ICD-10-CM | POA: Diagnosis not present

## 2016-03-16 DIAGNOSIS — T85618A Breakdown (mechanical) of other specified internal prosthetic devices, implants and grafts, initial encounter: Secondary | ICD-10-CM | POA: Diagnosis not present

## 2016-03-16 DIAGNOSIS — C50411 Malignant neoplasm of upper-outer quadrant of right female breast: Secondary | ICD-10-CM | POA: Insufficient documentation

## 2016-03-16 MED ORDER — SODIUM CHLORIDE 0.9% FLUSH
INTRAVENOUS | Status: AC
  Filled 2016-03-16: qty 10

## 2016-03-16 MED ORDER — IOPAMIDOL (ISOVUE-370) INJECTION 76%
INTRAVENOUS | Status: AC
  Administered 2016-03-16: 2 mL
  Filled 2016-03-16: qty 50

## 2016-03-16 NOTE — Patient Instructions (Addendum)
OV in 6 months.  CRP

## 2016-03-16 NOTE — Progress Notes (Signed)
Subjective:    Patient ID: Brittney [name], female    DOB: 1970/05/16, 46 y.o.   MRN: [id number] 09/2015 Wt 186. Today her weight is 168.  HPIHere today for f/u. She was last seen in September of 2017. Hx of  UC. Flex sigmoid in July of 2013 revealed active disease to distal sigmoid and rectum.  She had colonoscopy in October 2015 revealing rectal and patchy sigmoid colon disease.    Hx of Breast Cancer Diagnosed in April 58f2016  with breast cancer. Underwent a lumpectomy by Dr. DAnthony Sar She is finished with chemo and radiation.  She tells me she is doing good for the most part. She is maintained on Apriso 4 tabs a day for her UC.  He appetite is good. She has 1-3 stools a day and sometimes she will skip a day.  Stools are soft. Saw a small amt of blood yesterday.   09/17/2015 CRP 92  CBC    Component Value Date/Time   WBC 3.2 (L) 01/25/2016 1049   RBC 4.43 01/25/2016 1049   HGB 11.5 (L) 01/25/2016 1049   HCT 36.1 01/25/2016 1049   PLT 196 01/25/2016 1049   MCV 81.5 01/25/2016 1049   MCH 26.0 01/25/2016 1049   MCHC 31.9 01/25/2016 1049   RDW 14.7 01/25/2016 1049   LYMPHSABS 1.0 01/25/2016 1049   MONOABS 0.3 01/25/2016 1049   EOSABS 0.0 01/25/2016 1049   BASOSABS 0.0 01/25/2016 1049     Review of Systems   Past Medical History:  Diagnosis Date  . Breast cancer ([place] [place]   . Breast cancer of upper-outer quadrant of right female breast ([place] 06/11/2015   Triple negative, 2 cm   . Chemotherapy induced neutropenia ([place] 09/10/2015  . Diabetes mellitus ([facility]    Type 1 over 15 yrs  . Environmental allergies   . GERD (gastroesophageal reflux disease)   . Iron deficiency anemia due to chronic blood loss 06/12/2015  . 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 CESAREAN SECTION    . COLONOSCOPY N/A 10/09/2013   Procedure: COLONOSCOPY;  Surgeon: NRogene Houston MD;  Location: [facility] ENDO SUITE;  Service: Endoscopy;   Laterality: N/A;  100  . DILATION AND CURETTAGE OF UTERUS     x 2 for miscarriages  . ESOPHAGOGASTRODUODENOSCOPY    . FLEXIBLE SIGMOIDOSCOPY    . FLEXIBLE SIGMOIDOSCOPY  07/27/2011   Procedure: FLEXIBLE SIGMOIDOSCOPY;  Surgeon: NRogene Houston MD;  Location: [facility] ENDO SUITE;  Service: Endoscopy;  Laterality: N/A;  100    Allergies  Allergen Reactions  . Ace Inhibitors Itching  . Adhesive [Tape] Rash  . Pravastatin Sodium Itching  . Statins Itching  . Peanuts [Peanut Oil] Itching  . Shellfish Allergy Itching and Rash    Current Outpatient Prescriptions on File Prior to Visit  Medication Sig Dispense Refill  . acetaminophen (TYLENOL) 500 MG tablet Take 1,000 mg by mouth every 6 (six) hours as needed. For pain    . acyclovir (ZOVIRAX) 200 MG capsule Take 200 mg by mouth 2 (two) times daily. Reported on 07/23/2015    . albuterol (PROVENTIL HFA;VENTOLIN HFA) 108 (90 Base) MCG/ACT inhaler Inhale into the lungs every 6 (six) hours as needed for wheezing or shortness of breath.    . APRISO 0.375 g 24 hr capsule TAKE FOUR (4) CAPSULES BY MOUTH EVERY MORNING 120 capsule 5  . atorvastatin (LIPITOR) 20 MG tablet TAKE ONE  TABLET BY MOUTH DAILY FOR HIGH CHOLESTEROL.  6  . clotrimazole-betamethasone (LOTRISONE) cream Apply 1 application topically 2 (two) times daily. Apply to groin 30 g 0  . cyclobenzaprine (FLEXERIL) 5 MG tablet Take 1 tablet (5 mg total) by mouth 3 (three) times daily as needed for muscle spasms. 60 tablet 1  . EPINEPHrine (EPIPEN) 0.3 mg/0.3 mL SOAJ injection Inject into the muscle as needed.    [name] FLUoxetine (PROZAC) 40 MG capsule Take 1 capsule (40 mg total) by mouth daily. (Patient taking differently: Take 80 mg by mouth daily. ) 30 capsule 3  . gabapentin (NEURONTIN) 300 MG capsule Take 300 mg by mouth 3 (three) times daily.  6  . HYDROcodone-acetaminophen (NORCO/VICODIN) 5-325 MG tablet TAKE ONE TABLET BY MOUTH EVERY 4 HOURS AS NEEDED FOR MODERATE PAIN 30 tablet 0  . ibuprofen  (ADVIL,MOTRIN) 200 MG tablet Take 400 mg by mouth every 6 (six) hours as needed for moderate pain.    [name] insulin glargine (LANTUS) 100 UNIT/ML injection Inject 20 Units into the skin at bedtime. 18 to 20 units    . insulin lispro protamine-insulin lispro (HUMALOG 50/50) (50-50) 100 UNIT/ML SUSP Inject 10 Units into the skin 4 (four) times daily. With each meal    . loratadine (CLARITIN) 10 MG tablet TAKE ONE TABLET BY MOUTH DAILY FOR HIVES.  6  . LORazepam (ATIVAN) 0.5 MG tablet Take 1 tablet (0.5 mg total) by mouth every 8 (eight) hours. For nausea or anxiety 45 tablet 0  . mometasone (NASONEX) 50 MCG/ACT nasal spray Place 2 sprays into the nose daily as needed.     . mupirocin ointment (BACTROBAN) 2 % Place 1 application into the nose 2 (two) times daily. (Patient taking differently: Place 1 application into the nose 2 (two) times daily as needed (irritation). ) 22 g 0  . omeprazole (PRILOSEC) 20 MG capsule Take 20 mg by mouth 2 (two) times daily before a meal.     . ondansetron (ZOFRAN ODT) 8 MG disintegrating tablet Take 1 tablet (8 mg total) by mouth every 8 (eight) hours as needed for nausea or vomiting. 30 tablet 2  . polyethylene glycol powder (GLYCOLAX/MIRALAX) powder Take 8.5 g by mouth daily. (Patient taking differently: Take 8.5 g by mouth daily as needed for mild constipation. ) 255 g 5  . promethazine (PHENERGAN) 25 MG tablet Take 1 tablet (25 mg total) by mouth every 6 (six) hours as needed for nausea or vomiting. 30 tablet 2   Current Facility-Administered Medications on File Prior to Visit  Medication Dose Route Frequency Provider Last Rate Last Dose  . 0.9 %  sodium chloride infusion   Intravenous Continuous Baird Cancer, PA-C           Objective:   Physical Exam Blood pressure 130/86, pulse 64, temperature 98.3 F (36.8 C), height 5' 1"  (1.549 m), weight 168 lb (76.2 kg).  Alert and oriented. Skin warm and dry. Oral mucosa is moist.   . Sclera anicteric, conjunctivae is  pink. Thyroid not enlarged. No cervical lymphadenopathy. Lungs clear. Heart regular rate and rhythm.  Abdomen is soft. Bowel sounds are positive. No hepatomegaly. No abdominal masses felt. No tenderness.  No edema to lower extremities.         Assessment & Plan:  UC. Am going to get   CRP.  Last CRP was elevated OV in 6 months

## 2016-03-17 ENCOUNTER — Encounter (HOSPITAL_COMMUNITY): Payer: Self-pay | Admitting: Lab

## 2016-03-17 ENCOUNTER — Telehealth (HOSPITAL_COMMUNITY): Payer: Self-pay | Admitting: *Deleted

## 2016-03-17 ENCOUNTER — Encounter (HOSPITAL_COMMUNITY): Payer: Self-pay | Admitting: Adult Health

## 2016-03-17 ENCOUNTER — Other Ambulatory Visit (HOSPITAL_COMMUNITY): Payer: Self-pay | Admitting: Adult Health

## 2016-03-17 LAB — HIGH SENSITIVITY CRP: CRP HIGH SENSITIVITY: 3.7 mg/L — AB

## 2016-03-17 NOTE — Telephone Encounter (Signed)
Pt aware of date and time of appointment with Dr. Anthony Sar. Wednesday March 21st at 2:45. Pt verbalized understanding.

## 2016-03-17 NOTE — Progress Notes (Signed)
Dye study completed on 03/16/16 showed complete obstruction of LEFT subclavian port-a-cath.  Orders placed to have port removed with Dr. Marda Stalker office.  Nursing to call patient to make her aware of plan.    Mike Craze, NP [place] [phone]

## 2016-03-17 NOTE — Telephone Encounter (Signed)
Pt aware that port is "no good", per Mike Craze NP. Pt aware that we will be contacting her later today with appointment/referral to Dr. Anthony Sar for port removal. Pt aware that there is no need for port replacement due to no chemotherapy at this time. Pt verbalized understanding.

## 2016-03-17 NOTE — Progress Notes (Unsigned)
Referral to Dr Anthony Sar for port removal.  appt 3/21 @245 

## 2016-03-23 ENCOUNTER — Telehealth (INDEPENDENT_AMBULATORY_CARE_PROVIDER_SITE_OTHER): Payer: Self-pay | Admitting: Internal Medicine

## 2016-03-23 DIAGNOSIS — K5120 Ulcerative (chronic) proctitis without complications: Secondary | ICD-10-CM

## 2016-03-23 MED ORDER — MESALAMINE 4 G RE ENEM: 4.0000 g | ENEMA | Freq: Every day | RECTAL | 1 refills | Status: DC

## 2016-03-23 NOTE — Telephone Encounter (Signed)
Rx for Mesalamine enema sent to her pharmacy

## 2016-03-24 ENCOUNTER — Other Ambulatory Visit (INDEPENDENT_AMBULATORY_CARE_PROVIDER_SITE_OTHER): Payer: Self-pay | Admitting: *Deleted

## 2016-03-24 DIAGNOSIS — K5120 Ulcerative (chronic) proctitis without complications: Secondary | ICD-10-CM

## 2016-03-28 ENCOUNTER — Other Ambulatory Visit (INDEPENDENT_AMBULATORY_CARE_PROVIDER_SITE_OTHER): Payer: Self-pay | Admitting: *Deleted

## 2016-03-28 ENCOUNTER — Encounter (INDEPENDENT_AMBULATORY_CARE_PROVIDER_SITE_OTHER): Payer: Self-pay | Admitting: *Deleted

## 2016-03-28 DIAGNOSIS — K5120 Ulcerative (chronic) proctitis without complications: Secondary | ICD-10-CM

## 2016-04-21 ENCOUNTER — Other Ambulatory Visit (HOSPITAL_COMMUNITY): Payer: Self-pay | Admitting: Oncology

## 2016-04-21 DIAGNOSIS — Z853 Personal history of malignant neoplasm of breast: Secondary | ICD-10-CM

## 2016-04-21 LAB — CBC
HCT: 36 % (ref 35.0–45.0)
Hemoglobin: 11.3 g/dL — ABNORMAL LOW (ref 11.7–15.5)
MCH: 26.2 pg — ABNORMAL LOW (ref 27.0–33.0)
MCHC: 31.4 g/dL — AB (ref 32.0–36.0)
MCV: 83.5 fL (ref 80.0–100.0)
MPV: 9.8 fL (ref 7.5–12.5)
Platelets: 236 10*3/uL (ref 140–400)
RBC: 4.31 MIL/uL (ref 3.80–5.10)
RDW: 18.2 % — AB (ref 11.0–15.0)
WBC: 3.2 10*3/uL — ABNORMAL LOW (ref 3.8–10.8)

## 2016-04-25 ENCOUNTER — Encounter (HOSPITAL_COMMUNITY): Payer: Medicaid Other | Attending: Oncology | Admitting: Oncology

## 2016-04-25 ENCOUNTER — Other Ambulatory Visit (HOSPITAL_COMMUNITY): Payer: Medicaid Other

## 2016-04-25 ENCOUNTER — Encounter (HOSPITAL_COMMUNITY): Payer: Self-pay

## 2016-04-25 VITALS — BP 152/83 | HR 67 | Temp 98.1°F | Resp 16 | Wt 168.4 lb

## 2016-04-25 DIAGNOSIS — C50411 Malignant neoplasm of upper-outer quadrant of right female breast: Secondary | ICD-10-CM

## 2016-04-25 DIAGNOSIS — Z171 Estrogen receptor negative status [ER-]: Secondary | ICD-10-CM

## 2016-04-25 NOTE — Patient Instructions (Addendum)
[place] at [facility] Discharge Instructions  RECOMMENDATIONS MADE BY THE CONSULTANT AND ANY TEST RESULTS WILL BE SENT TO YOUR REFERRING PHYSICIAN.  You were seen today by Dr. Twana First Follow up in 3 months  Thank you for choosing [place] at [facility] to provide your oncology and hematology care.  To afford each patient quality time with our provider, please arrive at least 15 minutes before your scheduled appointment time.    If you have a lab appointment with the [facility] please come in thru the  Main Entrance and check in at the main information desk  You need to re-schedule your appointment should you arrive 10 or more minutes late.  We strive to give you quality time with our providers, and arriving late affects you and other patients whose appointments are after yours.  Also, if you no show three or more times for appointments you may be dismissed from the clinic at the providers discretion.     Again, thank you for choosing [facility].  Our hope is that these requests will decrease the amount of time that you wait before being seen by our physicians.       _____________________________________________________________  Should you have questions after your visit to [facility], please contact our office at [phone] between the hours of 8:30 a.m. and 4:30 p.m.  Voicemails left after 4:30 p.m. will not be returned until the following business day.  For prescription refill requests, have your pharmacy contact our office.       Resources For Cancer Patients and their Caregivers ? American Cancer Society: Can assist with transportation, wigs, general needs, runs Look Good Feel Better.        941 363 7745 ? Cancer Care: Provides financial assistance, online support groups, medication/co-pay assistance.  1-800-813-HOPE 219-258-3186) ? [facility] Assists [place] cancer  patients and their families through emotional , educational and financial support.  610-110-4958 ? [place] DSS Where to apply for food stamps, Medicaid and utility assistance. 413-815-4327 ? [facility]: Transportation to medical appointments. (813) 799-8413 ? Social Security Administration: May apply for disability if have a Stage IV cancer. 647 076 9538 585 797 7995 ? [employer], Disability and Transit Services: Assists with nutrition, care and transit needs. [place] Support Programs: @10RELATIVEDAYS @ > Cancer Support Group  2nd Tuesday of the month 1pm-2pm, Journey Room  > Creative Journey  3rd Tuesday of the month 1130am-1pm, Journey Room  > Look Good Feel Better  1st Wednesday of the month 10am-12 noon, Journey Room (Call [place] to register [phone])

## 2016-04-25 NOTE — Progress Notes (Signed)
Brittney [name]  PROGRESS NOTE  Patient Care Team: [name], MD as PCP - General (Unknown Physician Specialty)  CHIEF COMPLAINTS/PURPOSE OF CONSULTATION:     Malignant neoplasm of upper-outer quadrant of right female breast ([place])   04/29/2015 Initial Biopsy    upper outer quadrant mass R breast 2.9 cm by ultrasound, high grade invasive ductal carcinoma ER< 1%, PR 1%, HER -2 not amplified, Ki-67 66%      05/13/2015 Cancer Staging    pT1cN0      05/13/2015 Surgery    lumpectomy and sentinel node biopsies X 3, 2 cm tumor, 3 negative nodes       05/21/2015 Imaging    CT [facility] with postsurgical changes related to R breast lympectomy and R axillary LN dissection, no findings suspicious for metastatic disease      06/12/2015 Procedure    Port placement by Dr. Anthony Sar      06/17/2015 Imaging    MUGA- Normal left ventricular ejection fraction equal to 62.9%.      06/25/2015 - 08/13/2015 Chemotherapy    AC x 4      08/27/2015 - 09/03/2015 Chemotherapy    Weekly Taxol      09/11/2015 - 09/15/2015 Hospital Admission    Neutropenic fever, ARF secondary to antibiotics      10/01/2015 -  Chemotherapy    Taxotere 75 mg/m2 Q3 weeks        - 01/14/2016 Radiation Therapy    Radiation in [place]      HISTORY OF PRESENTING ILLNESS:  Brittney [name] 46 y.o. 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  She is doing well today. She has some numbness and tingling in her hands and feet. It is about the same, other than the numbness moving further up her right pinkie. Denies dropping objects. The numbness is not constant. She has her 3D mammogram scheduled for May 1. She had her port removed since her last visit. She does report some neck pain, but she is seeing an orthopedic doctor for this. Denies chest pain, SOB, abdominal pain, or any other concerns.   MEDICAL HISTORY:  Past Medical  History:  Diagnosis Date  . Breast cancer ([place])   . Breast cancer of upper-outer quadrant of right female breast ([place]) 06/11/2015   Triple negative, 2 cm   . Chemotherapy induced neutropenia ([place]) 09/10/2015  . Diabetes mellitus ([place])    Type 1 over 15 yrs  . Environmental allergies   . GERD (gastroesophageal reflux disease)   . Iron deficiency anemia due to chronic blood loss 06/12/2015  . 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 CESAREAN SECTION    . COLONOSCOPY N/A 10/09/2013   Procedure: COLONOSCOPY;  Surgeon: Rogene Houston, MD;  Location: [facility] ENDO SUITE;  Service: Endoscopy;  Laterality: N/A;  100  . DILATION AND CURETTAGE OF UTERUS     x 2 for miscarriages  . ESOPHAGOGASTRODUODENOSCOPY    . FLEXIBLE SIGMOIDOSCOPY    . FLEXIBLE SIGMOIDOSCOPY  07/27/2011   Procedure: FLEXIBLE SIGMOIDOSCOPY;  Surgeon: Rogene Houston, MD;  Location: [facility] ENDO SU[facility];  Service: Endoscopy;  Laterality: N/A;  100    SOCIAL HISTORY: Social History   Social History  . Marital status: Single    Spouse name: N/A  . Number of children: N/A  .  Years of education: N/A   Occupational History  . Not on file.   Social History Main Topics  . Smoking status: Never Smoker  . Smokeless tobacco: Never Used  . Alcohol use No  . Drug use: No  . Sexual activity: Not on file   Other Topics Concern  . Not on file   Social History Narrative  . No narrative on file   She is single. Has a 77 year old daughter. Works in home health care. Also going to school. Has to sit out her very last term in order to do her chemotherapy. Working toward RNA/CNA.  She does not smoke.  She loves to read, spend time with her family. She used to like to shop but doesn't have a lot of money to spend now, so it's at the bottom of the list.   FAMILY HISTORY: Family History  Problem Relation Age of Onset  .  Non-Hodgkin's lymphoma Mother   . Hypertension Mother   . Diabetes Mellitus II Mother   . Hypertension Father   . Diabetes Mellitus II Father   . Colon cancer Cousin   . Breast cancer Maternal Aunt    Mom diagnosed in 2013 with non-hodgkin's lymphoma. Had chemotherapy, radiation, more chemotherapy, and stem[facility]cell at [facility] using her cells. Her mother has been in remission since. Mother is aged 50 this month (June 20th, 2017). Father is 63 as well; has high blood pressure. PTSD. Has been treated at the [facility] in [place]. She believes he has high cholesterol too, like her mother. Otherwise, he is "alright I guess."  Has an older brother, and a younger sister. No one else in her immediate family with cancer.   ALLERGIES:  is allergic to ace inhibitors; adhesive [tape]; pravastatin sodium; statins; peanuts [peanut oil]; and shellfish allergy.  MEDICATIONS:  Current Outpatient Prescriptions  Medication Sig Dispense Refill  . acetaminophen (TYLENOL) 500 MG tablet Take 1,000 mg by mouth every 6 (six) hours as needed. For pain    . acyclovir (ZOVIRAX) 200 MG capsule Take 200 mg by mouth 2 (two) times daily. Reported on 07/23/2015    . albuterol (PROVENTIL HFA;VENTOLIN HFA) 108 (90 Base) MCG/ACT inhaler Inhale into the lungs every 6 (six) hours as needed for wheezing or shortness of breath.    . APRISO 0.375 g 24 hr capsule TAKE FOUR (4) CAPSULES BY MOUTH EVERY MORNING 120 capsule 5  . atorvastatin (LIPITOR) 20 MG tablet TAKE ONE TABLET BY MOUTH DAILY FOR HIGH CHOLESTEROL.  6  . clotrimazole-betamethasone (LOTRISONE) cream Apply 1 application topically 2 (two) times daily. Apply to groin 30 g 0  . EPINEPHrine (EPIPEN) 0.3 mg/0.3 mL SOAJ injection Inject into the muscle as needed.    [name] FLUoxetine (PROZAC) 40 MG capsule Take 1 capsule (40 mg total) by mouth daily. (Patient taking differently: Take 80 mg by mouth daily. ) 30 capsule 3  . gabapentin (NEURONTIN) 300 MG capsule Take 600 mg by  mouth at bedtime.   6  . HYDROcodone-acetaminophen (NORCO/VICODIN) 5-325 MG tablet TAKE ONE TABLET BY MOUTH EVERY 4 HOURS AS NEEDED FOR MODERATE PAIN 30 tablet 0  . ibuprofen (ADVIL,MOTRIN) 200 MG tablet Take 400 mg by mouth every 6 (six) hours as needed for moderate pain.    [name] insulin glargine (LANTUS) 100 UNIT/ML injection Inject 4 Units into the skin 3 (three) times daily with meals. 18 to 20 units    . insulin lispro protamine-insulin lispro (HUMALOG 50/50) (50-50) 100 UNIT/ML SUSP Inject 10  Units into the skin 4 (four) times daily. With each meal    . loratadine (CLARITIN) 10 MG tablet TAKE ONE TABLET BY MOUTH DAILY FOR HIVES.  6  . LORazepam (ATIVAN) 0.5 MG tablet Take 1 tablet (0.5 mg total) by mouth every 8 (eight) hours. For nausea or anxiety 45 tablet 0  . mesalamine (ROWASA) 4 g enema Place 60 mLs (4 g total) rectally at bedtime. 30 Bottle 1  . mometasone (NASONEX) 50 MCG/ACT nasal spray Place 2 sprays into the nose daily as needed.     . mupirocin ointment (BACTROBAN) 2 % Place 1 application into the nose 2 (two) times daily. (Patient taking differently: Place 1 application into the nose 2 (two) times daily as needed (irritation). ) 22 g 0  . omeprazole (PRILOSEC) 20 MG capsule Take 20 mg by mouth 2 (two) times daily before a meal.     . ondansetron (ZOFRAN ODT) 8 MG disintegrating tablet Take 1 tablet (8 mg total) by mouth every 8 (eight) hours as needed for nausea or vomiting. 30 tablet 2  . polyethylene glycol powder (GLYCOLAX/MIRALAX) powder Take 8.5 g by mouth daily. (Patient taking differently: Take 8.5 g by mouth daily as needed for mild constipation. ) 255 g 5  . promethazine (PHENERGAN) 25 MG tablet Take 1 tablet (25 mg total) by mouth every 6 (six) hours as needed for nausea or vomiting. 30 tablet 2   No current facility-administered medications for this visit.    Facility-Administered Medications Ordered in Other Visits  Medication Dose Route Frequency Provider Last Rate Last  Dose  . 0.9 %  sodium chloride infusion   Intravenous Continuous Baird Cancer, PA-C       Review of Systems  Constitutional: Negative.   HENT: Negative.   Eyes: Negative.   Respiratory: Negative.  Negative for shortness of breath.   Cardiovascular: Negative.  Negative for chest pain.  Gastrointestinal: Negative.  Negative for abdominal pain.  Genitourinary: Negative.   Musculoskeletal: Positive for neck pain.  Skin: Negative.   Neurological: Positive for tingling (hands and feet).  Endo/Heme/Allergies: Negative.   Psychiatric/Behavioral: Negative.   All other systems reviewed and are negative. 14 point ROS was done and is otherwise as detailed above or in HPI  PHYSICAL EXAMINATION: ECOG PERFORMANCE STATUS: 1 - Symptomatic but completely ambulatory  Vitals:   04/25/16 1136  BP: (!) 152/83  Pulse: 67  Resp: 16  Temp: 98.1 F (36.7 C)   Filed Weights   04/25/16 1136  Weight: 168 lb 6.4 oz (76.4 kg)   Physical Exam  Constitutional: She is oriented to person, place, and time and well-developed, well-nourished, and in no distress.  Alopecia.  HENT:  Head: Normocephalic and atraumatic.  Nose: Nose normal.  Mouth/Throat: Oropharynx is clear and moist. No oropharyngeal exudate.  Eyes: Conjunctivae and EOM are normal. Pupils are equal, round, and reactive to light. Right eye exhibits no discharge. Left eye exhibits no discharge. No scleral icterus.  Neck: Normal range of motion. Neck supple. No tracheal deviation present. No thyromegaly present.  Cardiovascular: Normal rate, regular rhythm and normal heart sounds.  Exam reveals no gallop and no friction rub.   No murmur heard. Pulmonary/Chest: Effort normal and breath sounds normal. She has no wheezes. She has no rales.    Abdominal: Soft. Bowel sounds are normal. She exhibits no distension and no mass. There is no tenderness. There is no rebound and no guarding.  Musculoskeletal: Normal range of motion. She exhibits no  edema.  Lymphadenopathy:    She has no cervical adenopathy.  Neurological: She is alert and oriented to person, place, and time. She has normal reflexes. No cranial nerve deficit. Gait normal. Coordination normal.  Skin: Skin is warm and dry. No rash noted.  Psychiatric: Mood, memory, affect and judgment normal.  Nursing note and vitals reviewed.  LABORATORY DATA: I have reviewed the data as listed. CBC Latest Ref Rng & Units 04/21/2016 01/25/2016 12/01/2015  WBC 3.8 - 10.8 K/uL 3.2(L) 3.2(L) 7.9  Hemoglobin 11.7 - 15.5 g/dL 11.3(L) 11.5(L) 10.5(L)  Hematocrit 35.0 - 45.0 % 36.0 36.1 33.6(L)  Platelets 140 - 400 K/uL 236 196 282   CMP Latest Ref Rng & Units 01/25/2016 12/01/2015 11/12/2015  Glucose 65 - 99 mg/dL 71 206(H) 423(H)  BUN 6 - 20 mg/dL 10 16 16   Creatinine 0.44 - 1.00 mg/dL 0.53 0.42(L) 0.59  Sodium 135 - 145 mmol/L 138 137 136  Potassium 3.5 - 5.1 mmol/L 3.6 3.8 3.5  Chloride 101 - 111 mmol/L 101 103 102  CO2 22 - 32 mmol/L 28 28 26   Calcium 8.9 - 10.3 mg/dL 8.8(L) 9.0 9.6  Total Protein 6.5 - 8.1 g/dL 6.7 6.4(L) 6.9  Total Bilirubin 0.3 - 1.2 mg/dL 0.5 0.6 0.8  Alkaline Phos 38 - 126 U/L 83 77 71  AST 15 - 41 U/L 22 19 15   ALT 14 - 54 U/L 17 16 14    RADIOGRAPHY: I have reviewed the data as listed below.  CT Head 10/29/2015 IMPRESSION: No explanation for headache.  No evidence of metastatic disease.  PATHOLOGY:   ASSESSMENT & PLAN:  pT1cN0 triple negative carcinoma of the R breast S/P lumpectomy and sentinel LN procedure Iron deficiency Anemia Ulcerative Colitis followed by Dr. Laural Golden Ongoing Menses Negative Genetic Testing [facility] Chemotherapy induced neutropenia Taxol induced musculoskeletal pain Hidradenitis Chemotherapy induced anemia Depression Steroid induced hyperglycemia Type 1 diabetes  Continue with scheduled mammogram on May 1. Clinically NED on breast exam today.  She knows to call us if she feels any breast masses.   She will return for  follow up in 3 months.    All questions were answered. The patient knows to call the clinic with any problems, questions or concerns.  This document serves as a record of services personally performed by Twana First, MD. It was created on her behalf by Martinique Casey, a trained medical scribe. The creation of this record is based on the scribe's personal observations and the provider's statements to them. This document has been checked and approved by the attending provider.  I have reviewed the above documentation for accuracy and completeness and I agree with the above.  This note was electronically signed.  Martinique M Casey  04/25/2016 11:46 AM

## 2016-05-03 ENCOUNTER — Encounter (HOSPITAL_COMMUNITY): Payer: Self-pay

## 2016-05-03 ENCOUNTER — Ambulatory Visit (HOSPITAL_COMMUNITY): Admission: RE | Admit: 2016-05-03 | Payer: Medicaid Other | Source: Ambulatory Visit

## 2016-05-05 ENCOUNTER — Other Ambulatory Visit (HOSPITAL_COMMUNITY): Payer: Self-pay | Admitting: *Deleted

## 2016-05-05 DIAGNOSIS — Z853 Personal history of malignant neoplasm of breast: Secondary | ICD-10-CM

## 2016-05-11 ENCOUNTER — Encounter (HOSPITAL_COMMUNITY): Payer: Self-pay | Admitting: Radiology

## 2016-05-11 ENCOUNTER — Ambulatory Visit (HOSPITAL_COMMUNITY)
Admission: RE | Admit: 2016-05-11 | Discharge: 2016-05-11 | Disposition: A | Discharge status: Home / Self Care | Payer: PRIVATE HEALTH INSURANCE | Source: Ambulatory Visit | Attending: *Deleted | Admitting: *Deleted

## 2016-05-11 DIAGNOSIS — Z853 Personal history of malignant neoplasm of breast: Secondary | ICD-10-CM | POA: Diagnosis present

## 2016-05-11 HISTORY — DX: Personal history of irradiation: Z92.3

## 2016-05-11 HISTORY — DX: Personal history of antineoplastic chemotherapy: Z92.21

## 2016-05-19 ENCOUNTER — Telehealth (INDEPENDENT_AMBULATORY_CARE_PROVIDER_SITE_OTHER): Payer: Self-pay | Admitting: *Deleted

## 2016-05-19 NOTE — Telephone Encounter (Signed)
Patient was called and made aware that we have started a PA. However, her insurance is recommending and is covered are Balsalazide, Sulfasalazine.

## 2016-05-19 NOTE — Telephone Encounter (Signed)
Patient called requesting a refill on [facility] but her pharmacy told her it needed a piror authorization and they sent over the form. Please advise patient at [phone]. [facility] is the pharmacy she uses

## 2016-07-25 NOTE — Progress Notes (Signed)
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, ER-/PR-/HER2-  CURRENT THERAPY: Surveillance per NCCN Guidelines    BRIEF ONCOLOGIC HISTORY:    Malignant neoplasm of upper-outer quadrant of right female breast ([place])   04/29/2015 Initial Biopsy    upper outer quadrant mass R breast 2.9 cm by ultrasound, high grade invasive ductal carcinoma ER< 1%, PR 1%, HER -2 not amplified, Ki-67 66%      05/13/2015 Cancer Staging    [id number]      05/13/2015 Surgery    lumpectomy and sentinel node biopsies X 3, 2 cm tumor, 3 negative nodes       05/21/2015 Imaging    CT [facility] with postsurgical changes related to R breast lympectomy and R axillary LN dissection, no findings suspicious for metastatic disease      06/12/2015 Procedure    Port placement by Dr. Anthony Sar      06/17/2015 Imaging    MUGA- Normal left ventricular ejection fraction equal to 62.9%.      06/25/2015 - 08/13/2015 Chemotherapy    AC x 4      08/27/2015 - 09/03/2015 Chemotherapy    Weekly Taxol x 2 cycles       09/11/2015 - 09/15/2015 Hospital Admission    Neutropenic fever, ARF secondary to antibiotics      10/01/2015 - 11/12/2015 Chemotherapy    Taxotere 75 mg/m2 Q3 weeks x 3 cycles        10/01/2015 Treatment Plan Change    Taxol changed to Taxotere given hospitalization.        - 01/14/2016 Radiation Therapy    Radiation in [facility]      05/11/2016 Mammogram    IMPRESSION: No evidence of malignancy within either breast. Expected postsurgical changes within the right breast.         INTERVAL HISTORY:  Brittney [name] 46 y.o. female returns for follow-up for history of Stage IA triple negative right breast cancer.   She is here today with her young daughter, who is a rising Health and safety inspector.   Overall, she tells me that she has been feeling pretty well.  Appetite is about 90%; energy levels about 80-85%. States "I do get tired sometimes."    Her biggest concerns today are (R) arm and breast swelling; she has noticed that it has worsened for about 1 month. She has some associated forearm pain intermittently at times. She thinks this may be related to her previous surgeries and radiation, but wants to discuss further today.  She is also concerned about recent headaches and dizziness.  States that the headaches occur "only occasionally", but she is worried it may be related to her cancer.  Reports dizziness as well, which occasionally occurs first thing in the morning.  Denies any known h/o vertigo.  Denies any associated vision changes, nausea, or vomiting. Denies any asymmetrical extremity weakness or falls.  No ataxia.    Her menstrual cycles have returned; she started spotting in June and most recent menstrual period in July was "very heavy."    Otherwise, she is largely without complaints.     REVIEW OF SYSTEMS:  Review of Systems  Constitutional: Positive for fatigue. Negative for chills and fever.  HENT:  Negative.   Eyes: Negative.  Respiratory: Negative.  Negative for cough and shortness of breath.   Cardiovascular: Negative.  Negative for chest pain.  Gastrointestinal: Negative.  Negative for abdominal pain, blood in stool, constipation, diarrhea, nausea and vomiting.  Endocrine: Negative.   Genitourinary: Positive for menstrual problem (heavy vaginal bleeding ). Negative for dysuria and hematuria.   Musculoskeletal:       (R) arm pain and swelling   Skin: Negative.  Negative for rash.  Neurological: Positive for dizziness, headaches and numbness (numbness/tingling in right arm at times with swelling).  Hematological: Negative.   Psychiatric/Behavioral: Positive for sleep disturbance (d/t nocturia ).     PAST MEDICAL/SURGICAL HISTORY:  Past Medical  History:  Diagnosis Date  . Breast cancer ([place])   . Breast cancer of upper-outer quadrant of right female breast ([place]) 06/11/2015   Triple negative, 2 cm   . Chemotherapy induced neutropenia ([place]) 09/10/2015  . Diabetes mellitus ([place])    Type 1 over 15 yrs  . Environmental allergies   . GERD (gastroesophageal reflux disease)   . Iron deficiency anemia due to chronic blood loss 06/12/2015  . Personal history of chemotherapy   . Personal history of radiation therapy   . 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 CESAREAN SECTION    . COLONOSCOPY N/A 10/09/2013   Procedure: COLONOSCOPY;  Surgeon: Rogene Houston, MD;  Location: [facility];  Service: Endoscopy;  Laterality: N/A;  100  . DILATION AND CURETTAGE OF UTERUS     x 2 for miscarriages  . ESOPHAGOGASTRODUODENOSCOPY    . FLEXIBLE SIGMOIDOSCOPY    . FLEXIBLE SIGMOIDOSCOPY  07/27/2011   Procedure: FLEXIBLE SIGMOIDOSCOPY;  Surgeon: Rogene Houston, MD;  Location: [facility];  Service: Endoscopy;  Laterality: N/A;  100     SOCIAL HISTORY:  Social History   Social History  . Marital status: Single    Spouse name: N/A  . Number of children: N/A  . Years of education: N/A   Occupational History  . Not on file.   Social History Main Topics  . Smoking status: Never Smoker  . Smokeless tobacco: Never Used  . Alcohol use No  . Drug use: No  . Sexual activity: Not on file   Other Topics Concern  . Not on file   Social History Narrative  . No narrative on file    FAMILY HISTORY:  Family History  Problem Relation Age of Onset  . Non-Hodgkin's lymphoma Mother   . Hypertension Mother   . Diabetes Mellitus II Mother   . Hypertension Father   . Diabetes Mellitus II Father   . Colon cancer Cousin   . Breast cancer Maternal Aunt     CURRENT MEDICATIONS:  Outpatient Encounter Prescriptions as of 07/26/2016  Medication Sig  . acetaminophen (TYLENOL) 500 MG  tablet Take 1,000 mg by mouth every 6 (six) hours as needed. For pain  . acyclovir (ZOVIRAX) 200 MG capsule Take 200 mg by mouth 2 (two) times daily. Reported on 07/23/2015  . albuterol (PROVENTIL HFA;VENTOLIN HFA) 108 (90 Base) MCG/ACT inhaler Inhale into the lungs every 6 (six) hours as needed for wheezing or shortness of breath.  . APRISO 0.375 g 24 hr capsule TAKE FOUR (4) CAPSULES BY MOUTH EVERY MORNING  . clotrimazole-betamethasone (LOTRISONE) cream Apply 1 application topically 2 (two) times daily. Apply to groin  . FLUoxetine (PROZAC) 40 MG capsule Take 1 capsule (40 mg total) by mouth  daily. (Patient taking differently: Take 80 mg by mouth daily. )  . gabapentin (NEURONTIN) 300 MG capsule Take 600 mg by mouth at bedtime.   [name] HYDROcodone-acetaminophen (NORCO/VICODIN) 5-325 MG tablet TAKE ONE TABLET BY MOUTH EVERY 4 HOURS AS NEEDED FOR MODERATE PAIN  . ibuprofen (ADVIL,MOTRIN) 200 MG tablet Take 400 mg by mouth every 6 (six) hours as needed for moderate pain.  [name] insulin glargine (LANTUS) 100 UNIT/ML injection Inject 18-20 Units into the skin at bedtime. 18 to 20 units  . insulin lispro protamine-insulin lispro (HUMALOG 50/50) (50-50) 100 UNIT/ML SUSP Inject 4 Units into the skin 4 (four) times daily. With each meal  . loratadine (CLARITIN) 10 MG tablet TAKE ONE TABLET BY MOUTH DAILY FOR HIVES.  [name] LORazepam (ATIVAN) 0.5 MG tablet Take 1 tablet (0.5 mg total) by mouth every 8 (eight) hours. For nausea or anxiety  . mesalamine (ROWASA) 4 g enema Place 60 mLs (4 g total) rectally at bedtime.  . mometasone (NASONEX) 50 MCG/ACT nasal spray Place 2 sprays into the nose daily as needed.   . mupirocin ointment (BACTROBAN) 2 % Place 1 application into the nose 2 (two) times daily. (Patient taking differently: Place 1 application into the nose 2 (two) times daily as needed (irritation). )  . omeprazole (PRILOSEC) 20 MG capsule Take 20 mg by mouth 2 (two) times daily before a meal.   . ondansetron (ZOFRAN  ODT) 8 MG disintegrating tablet Take 1 tablet (8 mg total) by mouth every 8 (eight) hours as needed for nausea or vomiting.  . polyethylene glycol powder (GLYCOLAX/MIRALAX) powder Take 8.5 g by mouth daily. (Patient taking differently: Take 8.5 g by mouth daily as needed for mild constipation. )  . promethazine (PHENERGAN) 25 MG tablet Take 1 tablet (25 mg total) by mouth every 6 (six) hours as needed for nausea or vomiting.  [name] EPINEPHrine (EPIPEN) 0.3 mg/0.3 mL SOAJ injection Inject into the muscle as needed.  . [DISCONTINUED] atorvastatin (LIPITOR) 20 MG tablet TAKE ONE TABLET BY MOUTH DAILY FOR HIGH CHOLESTEROL.   Facility-Administered Encounter Medications as of 07/26/2016  Medication  . 0.9 %  sodium chloride infusion    ALLERGIES:  Allergies  Allergen Reactions  . Ace Inhibitors Itching  . Adhesive [Tape] Rash  . Pravastatin Sodium Itching  . Statins Itching  . Peanuts [Peanut Oil] Itching  . Shellfish Allergy Itching and Rash     PHYSICAL EXAM:  ECOG Performance status: 1 - Symptomatic; remains independent   Vitals:   07/26/16 1015  BP: (!) 141/88  Pulse: 78  Resp: 16   Filed Weights   07/26/16 1015  Weight: 171 lb (77.6 kg)    Physical Exam  Constitutional: She is oriented to person, place, and time and well-developed, well-nourished, and in no distress.  HENT:  Head: Normocephalic.  Mouth/Throat: Oropharynx is clear and moist. No oropharyngeal exudate.  Eyes: Pupils are equal, round, and reactive to light. Conjunctivae are normal. No scleral icterus.  Neck: Normal range of motion. Neck supple.  Cardiovascular: Normal rate, regular rhythm and normal heart sounds.   Pulmonary/Chest: Effort normal and breath sounds normal. No respiratory distress.    Abdominal: Soft. Bowel sounds are normal. There is no tenderness.  Musculoskeletal: Normal range of motion. She exhibits edema (R) arm lymphedema noted .  Lymphadenopathy:    She has no cervical adenopathy.        Right: No supraclavicular adenopathy present.       Left: No supraclavicular adenopathy present.  Neurological: She is alert and oriented to person, place, and time. No cranial nerve deficit. Gait normal.  Skin: Skin is warm and dry. No rash noted.  Psychiatric: Mood, memory, affect and judgment normal.  Nursing note and vitals reviewed.    LABORATORY DATA:  I have reviewed the labs as listed.  CBC    Component Value Date/Time   WBC 3.2 (L) 04/21/2016 0904   RBC 4.31 04/21/2016 0904   HGB 11.3 (L) 04/21/2016 0904   HCT 36.0 04/21/2016 0904   PLT 236 04/21/2016 0904   MCV 83.5 04/21/2016 0904   MCH 26.2 (L) 04/21/2016 0904   MCHC 31.4 (L) 04/21/2016 0904   RDW 18.2 (H) 04/21/2016 0904   LYMPHSABS 1.0 01/25/2016 1049   MONOABS 0.3 01/25/2016 1049   EOSABS 0.0 01/25/2016 1049   BASOSABS 0.0 01/25/2016 1049   CMP Latest Ref Rng & Units 01/25/2016 12/01/2015 11/12/2015  Glucose 65 - 99 mg/dL 71 206(H) 423(H)  BUN 6 - 20 mg/dL _0 Creatinine 0.44 - 1.00 mg/dL 0.53 0.42(L) 0.59  Sodium 135 - 145 mmol/L 138 137 136  Potassium 3.5 - 5.1 mmol/L 3.6 3.8 3.5  Chloride 101 - 111 mmol/L 101 103 102  CO2 22 - 32 mmol/L _1 Calcium 8.9 - 10.3 mg/dL 8.8(L) 9.0 9.6  Total Protein 6.5 - 8.1 g/dL 6.7 6.4(L) 6.9  Total Bilirubin 0.3 - 1.2 mg/dL 0.5 0.6 0.8  Alkaline Phos 38 - 126 U/L 83 77 71  AST 15 - 41 U/L _2 ALT 14 - 54 U/L _3 PENDING LABS:    DIAGNOSTIC IMAGING:  *The following radiologic images and reports have been reviewed independently and agree with below findings.  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 Genetic testing in 05/2015 negative. Treated with lumpectomy & SLNB at [facility]).  Received adjuvant chemotherapy with  Adriamycin/Cytoxan x 4, followed by weekly Taxol x 2;chemo course complicated by hospitalization for neutropenic fever.  Taxol switched to Docetaxel every 3 weeks and she completed 3 cycles; chemo completed on 11/12/15. Went on to have adjuvant breast radiation in [place]; completed on 01/14/16.   -Most recent mammogram 05/11/16 reviewed and negative for recurrence.  She will be due for annual mammography in 05/2017; will place orders at subsequent follow-up visits.   -Clinical breast exam performed today and negative. -Reviewed follow-up/surveillance recommendations. No role for anti-estrogen therapy given ER-. She understands that her highest risk of recurrence is within the first 2 years after treatment.  -Return to cancer center in 3 months for follow-up with labs.    Headaches with intermittent dizziness:  -New and worrisome symptoms for patient within the past 2-3 months.  Headache and dizziness are intermittent.   -Given her history of breast cancer, we will obtain MRI brain to rule out metastatic disease. I shared with her that I have low clinical suspicion that MRI will reveal cancer, but it is prudent to rule it out, particularly in patients who are triple negative. If MRI is negative for malignancy, then could consider referral to neurology for further evaluation.  We will give her a call when we receive the results of MRI.   (R) arm & breast lymphedema:  -Likely  secondary to previous surgery and radiation.  Discussed that lymphedema, in both the breast and affected arm, are extremely common symptoms reported by breast cancer survivors.  Recommended referral to PT/OT as an outpatient for lymphedema management. Patient concerned about cost of these services and her Medicaid insurance. Shared with her that generally at least 3 visits of therapy are covered for patients with Medicaid.  She agreed to referral.       Dispo:  -MRI brain sometime within the next 1-2 weeks.  -Referral to OT/PT for (R)  arm and breast lymphedema.   -Return to cancer center in 3 months with labs. Will add on anemia panel for next visit given reported intermittent fatigue and heavy menstrual bleeding.     All questions were answered to patient's stated satisfaction. Encouraged patient to call with any new concerns or questions before her next visit to the cancer center and we can certain see her sooner, if needed.    Plan of care discussed with Dr. Talbert Cage, who agrees with the above aforementioned.    Orders placed this encounter:  Orders Placed This Encounter  Procedures  . MR Brain W Wo Contrast  . CBC with Differential/Platelet  . Comprehensive metabolic panel  . Vitamin B12  . Folate  . Iron and TIBC  . Ferritin  . Ambulatory referral to [place], NP [place] [phone]

## 2016-07-26 ENCOUNTER — Encounter (HOSPITAL_COMMUNITY): Payer: Self-pay | Admitting: Adult Health

## 2016-07-26 ENCOUNTER — Encounter (HOSPITAL_COMMUNITY): Payer: PRIVATE HEALTH INSURANCE | Attending: Adult Health | Admitting: Adult Health

## 2016-07-26 VITALS — BP 141/88 | HR 78 | Resp 16 | Ht 61.0 in | Wt 171.0 lb

## 2016-07-26 DIAGNOSIS — Z171 Estrogen receptor negative status [ER-]: Secondary | ICD-10-CM

## 2016-07-26 DIAGNOSIS — C50411 Malignant neoplasm of upper-outer quadrant of right female breast: Secondary | ICD-10-CM | POA: Diagnosis present

## 2016-07-26 DIAGNOSIS — I890 Lymphedema, not elsewhere classified: Secondary | ICD-10-CM

## 2016-07-26 DIAGNOSIS — R5383 Other fatigue: Secondary | ICD-10-CM | POA: Diagnosis not present

## 2016-07-26 DIAGNOSIS — R51 Headache: Secondary | ICD-10-CM

## 2016-07-26 DIAGNOSIS — C50919 Malignant neoplasm of unspecified site of unspecified female breast: Secondary | ICD-10-CM

## 2016-07-26 DIAGNOSIS — R519 Headache, unspecified: Secondary | ICD-10-CM

## 2016-07-26 NOTE — Patient Instructions (Addendum)
[place] at [facility] Discharge Instructions  RECOMMENDATIONS MADE BY THE CONSULTANT AND ANY TEST RESULTS WILL BE SENT TO YOUR REFERRING PHYSICIAN.  You were seen today by Mike Craze NP. MRI to be scheduled in the next couple weeks. Referring you outpatient OT for the lymphedema. Return in 3 months for labs and follow up.    Thank you for choosing [place] at [facility] to provide your oncology and hematology care.  To afford each patient quality time with our provider, please arrive at least 15 minutes before your scheduled appointment time.    If you have a lab appointment with the [facility] please come in thru the  Main Entrance and check in at the main information desk  You need to re-schedule your appointment should you arrive 10 or more minutes late.  We strive to give you quality time with our providers, and arriving late affects you and other patients whose appointments are after yours.  Also, if you no show three or more times for appointments you may be dismissed from the clinic at the providers discretion.     Again, thank you for choosing [facility].  Our hope is that these requests will decrease the amount of time that you wait before being seen by our physicians.       _____________________________________________________________  Should you have questions after your visit to [facility], please contact our office at [phone] between the hours of 8:30 a.m. and 4:30 p.m.  Voicemails left after 4:30 p.m. will not be returned until the following business day.  For prescription refill requests, have your pharmacy contact our office.       Resources For Cancer Patients and their Caregivers ? American Cancer Society: Can assist with transportation, wigs, general needs, runs Look Good Feel Better.        787-436-8858 ? Cancer Care: Provides financial assistance, online support  groups, medication/co-pay assistance.  1-800-813-HOPE 781-853-4104) ? [facility] Assists [place] cancer patients and their families through emotional , educational and financial support.  630-874-8552 ? [place] DSS Where to apply for food stamps, Medicaid and utility assistance. 4370862688 ? [facility]: Transportation to medical appointments. 734-306-8360 ? Social Security Administration: May apply for disability if have a Stage IV cancer. 2390335560 408 360 1635 ? [employer], Disability and Transit Services: Assists with nutrition, care and transit needs. [facility] Support Programs: @10RELATIVEDAYS @ > Cancer Support Group  2nd Tuesday of the month 1pm-2pm, Journey Room  > Creative Journey  3rd Tuesday of the month 1130am-1pm, Journey Room  > Look Good Feel Better  1st Wednesday of the month 10am-12 noon, Journey Room (Call [place] to register [phone])

## 2016-07-28 ENCOUNTER — Other Ambulatory Visit (HOSPITAL_COMMUNITY): Payer: Self-pay | Admitting: Adult Health

## 2016-07-28 DIAGNOSIS — I890 Lymphedema, not elsewhere classified: Secondary | ICD-10-CM

## 2016-07-28 DIAGNOSIS — C50919 Malignant neoplasm of unspecified site of unspecified female breast: Secondary | ICD-10-CM

## 2016-07-28 DIAGNOSIS — Z171 Estrogen receptor negative status [ER-]: Principal | ICD-10-CM

## 2016-07-28 DIAGNOSIS — C50411 Malignant neoplasm of upper-outer quadrant of right female breast: Secondary | ICD-10-CM

## 2016-07-29 ENCOUNTER — Ambulatory Visit (HOSPITAL_COMMUNITY)
Admission: RE | Admit: 2016-07-29 | Discharge: 2016-07-29 | Disposition: A | Discharge status: Home / Self Care | Payer: Medicaid Other | Source: Ambulatory Visit | Attending: Adult Health | Admitting: Adult Health

## 2016-07-29 DIAGNOSIS — R9082 White matter disease, unspecified: Secondary | ICD-10-CM | POA: Insufficient documentation

## 2016-07-29 DIAGNOSIS — R51 Headache: Secondary | ICD-10-CM | POA: Insufficient documentation

## 2016-07-29 DIAGNOSIS — C50919 Malignant neoplasm of unspecified site of unspecified female breast: Secondary | ICD-10-CM | POA: Insufficient documentation

## 2016-07-29 DIAGNOSIS — Z171 Estrogen receptor negative status [ER-]: Secondary | ICD-10-CM | POA: Insufficient documentation

## 2016-07-29 DIAGNOSIS — R519 Headache, unspecified: Secondary | ICD-10-CM

## 2016-07-29 DIAGNOSIS — C50411 Malignant neoplasm of upper-outer quadrant of right female breast: Secondary | ICD-10-CM | POA: Diagnosis not present

## 2016-07-29 LAB — POCT I-STAT CREATININE: Creatinine, Ser: 0.6 mg/dL (ref 0.44–1.00)

## 2016-07-29 MED ORDER — GADOBENATE DIMEGLUMINE 529 MG/ML IV SOLN
15.0000 mL | Freq: Once | INTRAVENOUS | Status: AC | PRN
  Administered 2016-07-29: 15 mL via INTRAVENOUS

## 2016-08-01 ENCOUNTER — Ambulatory Visit (HOSPITAL_COMMUNITY): Payer: Medicaid Other | Attending: Adult Health | Admitting: Physical Therapy

## 2016-08-01 DIAGNOSIS — I890 Lymphedema, not elsewhere classified: Secondary | ICD-10-CM | POA: Diagnosis present

## 2016-08-01 NOTE — Therapy (Signed)
[place], [place], [address] Phone: [phone]   Fax:  [phone]  Physical Therapy Evaluation  Patient Details  Name: Brittney [name] MRN: [id number] Date of Birth: September 25, 1970 Referring Provider: Mike Craze   Encounter Date: 08/01/2016      PT End of Session - 08/01/16 1635    Visit Number 1   Number of Visits 1   Authorization Type medicaid   Authorization - Visit Number 1   Authorization - Number of Visits 1   PT Start Time 0459   PT Stop Time 1430   PT Time Calculation (min) 45 min   Activity Tolerance Patient tolerated treatment well      Past Medical History:  Diagnosis Date  . Breast cancer ([place])   . Breast cancer of upper-outer quadrant of right female breast ([place]) 06/11/2015   Triple negative, 2 cm   . Chemotherapy induced neutropenia ([place]) 09/10/2015  . Diabetes mellitus ([place])    Type 1 over 15 yrs  . Environmental allergies   . GERD (gastroesophageal reflux disease)   . Iron deficiency anemia due to chronic blood loss 06/12/2015  . Personal history of chemotherapy   . Personal history of radiation therapy   . 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 CESAREAN SECTION    . COLONOSCOPY N/A 10/09/2013   Procedure: COLONOSCOPY;  Surgeon: Rogene Houston, MD;  [facility];  Service: Endoscopy;  Laterality: N/A;  100  . DILATION AND CURETTAGE OF UTERUS     x 2 for miscarriages  . ESOPHAGOGASTRODUODENOSCOPY    . FLEXIBLE SIGMOIDOSCOPY    . FLEXIBLE SIGMOIDOSCOPY  07/27/2011   Procedure: FLEXIBLE SIGMOIDOSCOPY;  Surgeon: Rogene Houston, MD;  Location: [facility];  Service: Endoscopy;  Laterality: N/A;  100    There were no vitals filed for this visit.       Subjective Assessment - 08/01/16 1351    Subjective Brittney [name] has been diagnosed with Lt breast cancer and has gone through series of chemo as well  as radiation.   She noticed her Rt arm swelling in June.     Currently in Pain? No/denies            [facility] PT Assessment - 08/01/16 0001      Assessment   Medical Diagnosis Lymphedema    Referring Provider Mike Craze    Onset Date/Surgical Date 06/05/16   Hand Dominance Right   Next MD Visit 10/03/2016   Prior Therapy none     Precautions   Precautions None     Restrictions   Weight Bearing Restrictions No     Balance Screen   Has the patient fallen in the past 6 months No   Has the patient had a decrease in activity level because of a fear of falling?  No   Is the patient reluctant to leave their home because of a fear of falling?  No     Home Environment   Living Environment Private residence     Prior Function   Level of Independence Independent     Cognition   Overall Cognitive Status Within Functional Limits for tasks assessed     Observation/Other Assessments   Other Surveys  --  Life impact 50           LYMPHEDEMA/ONCOLOGY QUESTIONNAIRE - 08/01/16 1404      What  other symptoms do you have   Are you Having Heaviness or Tightness Yes   Are you having Pain No   Are you having pitting edema Yes   Body Site right arm   Is it Hard or Difficult finding clothes that fit Yes   Do you have infections No     Lymphedema Stage   Stage STAGE 2 SPONTANEOUSLY IRREVERSIBLE     Lymphedema Assessments   Lymphedema Assessments Upper extremities     Right Upper Extremity Lymphedema   At Axilla  32.5 cm   15 cm Proximal to Olecranon Process 34 cm   10 cm Proximal to Olecranon Process 36 cm   Olecranon Process 30.9 cm   15 cm Proximal to Ulnar Styloid Process 29.5 cm   10 cm Proximal to Ulnar Styloid Process 26 cm   Just Proximal to Ulnar Styloid Process 20 cm   Across Hand at [employer] 20 cm   At [place] of 2nd Digit 7.3 cm     Left Upper Extremity Lymphedema   At Axilla  30.5 cm   15 cm Proximal to Olecranon Process 33 cm   10 cm Proximal to  Olecranon Process 34.5 cm   Olecranon Process 29 cm   15 cm Proximal to Ulnar Styloid Process 29.2 cm   10 cm Proximal to Ulnar Styloid Process 24 cm   Just Proximal to Ulnar Styloid Process 18 cm   Across Hand at [employer] 20 cm   At [place] of 2nd Digit 6.8 cm         Objective measurements completed on examination: See above findings.                  PT Education - 08/01/16 1634    Education provided Yes   Education Details HEP to increase lymph ciruculation, self massaging to decrease edema, use of a pump to decrease edema use of a compression garment.    Person(s) Educated Patient   Methods Explanation   Comprehension Verbalized understanding;Returned demonstration          PT Short Term Goals - 08/01/16 1635      PT SHORT TERM GOAL #1   Title Pt to be knowledgable in self massaging techniques   Time 1   Period Days   Status Achieved   Target Date 08/01/16     PT SHORT TERM GOAL #2   Title PT to have obtained a compression pump, compression garment and night sleeve   Time 2   Period Weeks   Status New   Target Date 08/15/16                   Plan - 08/01/16 1637    Clinical Impression Statement Brittney [name] is a 46 yo female who is experiencing Rt lymphedema after having radiation treatments following the diagnosis of Rt breast cancer.  Her insurance will only cover one treatment.  She was educated on what lymphedema was and that it can not be cured,educated in self massaging techniques and exercise and educated in using a pump and gament for compression.  The prescription for the pump and garment have been sent out today and will be forwarded to appropriate people once it returns.     Clinical Presentation Evolving   Clinical Decision Making Moderate   Rehab Potential Good   PT Frequency 1x / week   PT Duration --  1 week   PT Treatment/Interventions ADLs/Self Care Home Management  PT Next Visit Plan discharge one time treatment  only       Patient will benefit from skilled therapeutic intervention in order to improve the following deficits and impairments:  Increased edema  Visit Diagnosis: Lymphedema, not elsewhere classified     Problem List Patient Active Problem List   Diagnosis Date Noted  . Neutropenic fever ([place]) 09/11/2015  . Chemotherapy induced neutropenia ([place]) 09/10/2015  . Nausea without vomiting 07/09/2015  . Genetic counseling and testing 06/29/2015  . Iron deficiency anemia due to chronic blood loss 06/12/2015  . Malignant neoplasm of upper-outer quadrant of right female breast ([place]) 06/11/2015  . Bilateral cataracts 05/12/2015  . Type 2 diabetes mellitus without complication ([place]) 64/38/3818  . Gastroparesis 08/26/2013  . THYROID NODULE, RIGHT 04/17/2008  . Ulcerative colitis ([place]) 11/01/2007  . Type 1 diabetes mellitus ([place]) 10/11/2007  . HYPERLIPIDEMIA 10/11/2007  . ANEMIA-NOS 10/11/2007  . Depression 10/11/2007  . GERD 10/11/2007  . PEPTIC ULCER DISEASE 10/11/2007  . HEADACHE 10/11/2007  . COLONIC POLYPS, HX OF 10/11/2007  . UTI'S, HX OF 10/11/2007  . Demetrius Charity OF 10/11/2007    Rayetta Humphrey, PT CLT [phone] 08/01/2016, 4:41 PM  [place] [address] [place], [place], [address] Phone: [phone]   Fax:  [phone]  Name: Brittney [name] MRN: [id number] Date of Birth: Jul 17, 1970

## 2016-09-19 ENCOUNTER — Ambulatory Visit (INDEPENDENT_AMBULATORY_CARE_PROVIDER_SITE_OTHER): Payer: Medicaid Other | Admitting: Internal Medicine

## 2016-09-20 ENCOUNTER — Encounter (INDEPENDENT_AMBULATORY_CARE_PROVIDER_SITE_OTHER): Payer: Self-pay | Admitting: Internal Medicine

## 2016-09-20 ENCOUNTER — Encounter (INDEPENDENT_AMBULATORY_CARE_PROVIDER_SITE_OTHER): Payer: Self-pay

## 2016-09-20 ENCOUNTER — Ambulatory Visit (INDEPENDENT_AMBULATORY_CARE_PROVIDER_SITE_OTHER): Payer: Medicaid Other | Admitting: Internal Medicine

## 2016-09-20 VITALS — BP 160/100 | HR 64 | Temp 97.6°F | Ht 61.0 in | Wt 176.1 lb

## 2016-09-20 DIAGNOSIS — K5120 Ulcerative (chronic) proctitis without complications: Secondary | ICD-10-CM

## 2016-09-20 NOTE — Progress Notes (Signed)
Subjective:    Patient ID: Brittney [name], female    DOB: 11/14/1970, 46 y.o.   MRN: [id number]  HPI Here today for f/u. Last seen in March of this year. Wt in March 168.  Hx of UC. Flex sigmoid in July of 2013 revealed active disease to distal sigmoid and rectum. She tells me she is doing good.  She is having a BM 3-5 times a weeks. Stools are formed. No melena or BRRB. Appetite is good. She has gained from 168 to 176.1lb.  03/17/2016 CRP 3.7.  CBC    Component Value Date/Time   WBC 3.2 (L) 04/21/2016 0904   RBC 4.31 04/21/2016 0904   HGB 11.3 (L) 04/21/2016 0904   HCT 36.0 04/21/2016 0904   PLT 236 04/21/2016 0904   MCV 83.5 04/21/2016 0904   MCH 26.2 (L) 04/21/2016 0904   MCHC 31.4 (L) 04/21/2016 0904   RDW 18.2 (H) 04/21/2016 0904   LYMPHSABS 1.0 01/25/2016 1049   MONOABS 0.3 01/25/2016 1049   EOSABS 0.0 01/25/2016 1049   BASOSABS 0.0 01/25/2016 1049     She had colonoscopy in October 2015 revealing rectal and patchy sigmoid colon disease.    Hx of Breast Cancer Diagnosed in April 67f2016  with breast cancer. Underwent a lumpectomy by Dr. DAnthony Sar She is finished with chemo and radiation.     Review of Systems     Past Medical History:  Diagnosis Date  . [place]   . Breast cancer of upper-outer quadrant of right female breast ([place] 06/11/2015   Triple negative, 2 cm   . Chemotherapy induced neutropenia ([place] 09/10/2015  . [place]    Type 1 over 15 yrs  . Environmental allergies   . GERD (gastroesophageal reflux disease)   . Iron deficiency anemia due to chronic blood loss 06/12/2015  . Personal history of chemotherapy   . Personal history of radiation therapy   . 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 CESAREAN SECTION    . COLONOSCOPY N/A 10/09/2013   Procedure: COLONOSCOPY;  Surgeon: NRogene Houston MD;  [facility];  Service: Endoscopy;   Laterality: N/A;  100  . DILATION AND CURETTAGE OF UTERUS     x 2 for miscarriages  . ESOPHAGOGASTRODUODENOSCOPY    . FLEXIBLE SIGMOIDOSCOPY    . FLEXIBLE SIGMOIDOSCOPY  07/27/2011   Procedure: FLEXIBLE SIGMOIDOSCOPY;  Surgeon: NRogene Houston MD;  [facility];  Service: Endoscopy;  Laterality: N/A;  100    Allergies  Allergen Reactions  . Ace Inhibitors Itching  . Adhesive [Tape] Rash  . Pravastatin Sodium Itching  . Statins Itching  . Peanuts [Peanut Oil] Itching  . Shellfish Allergy Itching and Rash    Current Outpatient Prescriptions on File Prior to Visit  Medication Sig Dispense Refill  . acetaminophen (TYLENOL) 500 MG tablet Take 1,000 mg by mouth every 6 (six) hours as needed. For pain    . acyclovir (ZOVIRAX) 200 MG capsule Take 200 mg by mouth 2 (two) times daily. Reported on 07/23/2015    . albuterol (PROVENTIL HFA;VENTOLIN HFA) 108 (90 Base) MCG/ACT inhaler Inhale into the lungs every 6 (six) hours as needed for wheezing or shortness of breath.    . APRISO 0.375 g 24 hr capsule TAKE FOUR (4) CAPSULES BY MOUTH EVERY MORNING 120 capsule 5  . clotrimazole-betamethasone (LOTRISONE) cream Apply 1 application topically 2 (  two) times daily. Apply to groin (Patient taking differently: Apply 1 application topically as needed. Apply to groin) 30 g 0  . EPINEPHrine (EPIPEN) 0.3 mg/0.3 mL SOAJ injection Inject into the muscle as needed.    [name] FLUoxetine (PROZAC) 40 MG capsule Take 1 capsule (40 mg total) by mouth daily. (Patient taking differently: Take 80 mg by mouth daily. ) 30 capsule 3  . gabapentin (NEURONTIN) 300 MG capsule Take 600 mg by mouth at bedtime.   6  . HYDROcodone-acetaminophen (NORCO/VICODIN) 5-325 MG tablet TAKE ONE TABLET BY MOUTH EVERY 4 HOURS AS NEEDED FOR MODERATE PAIN 30 tablet 0  . ibuprofen (ADVIL,MOTRIN) 200 MG tablet Take 400 mg by mouth every 6 (six) hours as needed for moderate pain.    [name] insulin glargine (LANTUS) 100 UNIT/ML injection Inject  18-20 Units into the skin at bedtime. 18 to 20 units    . insulin lispro protamine-insulin lispro (HUMALOG 50/50) (50-50) 100 UNIT/ML SUSP Inject 4 Units into the skin 4 (four) times daily. With each meal    . loratadine (CLARITIN) 10 MG tablet TAKE ONE TABLET BY MOUTH DAILY FOR HIVES.  6  . LORazepam (ATIVAN) 0.5 MG tablet Take 1 tablet (0.5 mg total) by mouth every 8 (eight) hours. For nausea or anxiety 45 tablet 0  . mesalamine (ROWASA) 4 g enema Place 60 mLs (4 g total) rectally at bedtime. (Patient taking differently: Place 4 g rectally as needed. ) 30 Bottle 1  . mometasone (NASONEX) 50 MCG/ACT nasal spray Place 2 sprays into the nose daily as needed.     . mupirocin ointment (BACTROBAN) 2 % Place 1 application into the nose 2 (two) times daily. (Patient taking differently: Place 1 application into the nose 2 (two) times daily as needed (irritation). ) 22 g 0  . omeprazole (PRILOSEC) 20 MG capsule Take 20 mg by mouth 2 (two) times daily before a meal.     . ondansetron (ZOFRAN ODT) 8 MG disintegrating tablet Take 1 tablet (8 mg total) by mouth every 8 (eight) hours as needed for nausea or vomiting. 30 tablet 2  . polyethylene glycol powder (GLYCOLAX/MIRALAX) powder Take 8.5 g by mouth daily. (Patient taking differently: Take 8.5 g by mouth daily as needed for mild constipation. ) 255 g 5  . promethazine (PHENERGAN) 25 MG tablet Take 1 tablet (25 mg total) by mouth every 6 (six) hours as needed for nausea or vomiting. 30 tablet 2   Current Facility-Administered Medications on File Prior to Visit  Medication Dose Route Frequency Provider Last Rate Last Dose  . 0.9 %  sodium chloride infusion   Intravenous Continuous Baird Cancer, PA-C         Objective:   Physical Exam  Vitals:   09/20/16 1023  Weight: 176 lb 1.6 oz (79.9 kg)  Height: 5' 1"  (1.549 m)    Alert and oriented. Skin warm and dry. Oral mucosa is moist.   . Sclera anicteric, conjunctivae is pink. Thyroid not enlarged. No  cervical lymphadenopathy. Lungs clear. Heart regular rate and rhythm.  Abdomen is soft. Bowel sounds are positive. No hepatomegaly. No abdominal masses felt. No tenderness.  No edema to lower extremities.          Assessment & Plan:  UC. She  is doing well. She will continue the Apriso.  CBC and CRP today.  OV in 6 months.

## 2016-09-20 NOTE — Patient Instructions (Signed)
Labs today. OV in 6 months.

## 2016-09-21 ENCOUNTER — Other Ambulatory Visit (INDEPENDENT_AMBULATORY_CARE_PROVIDER_SITE_OTHER): Payer: Self-pay | Admitting: *Deleted

## 2016-09-21 DIAGNOSIS — K5190 Ulcerative colitis, unspecified, without complications: Secondary | ICD-10-CM

## 2016-09-21 LAB — CBC WITH DIFFERENTIAL/PLATELET
BASOS ABS: 8 {cells}/uL (ref 0–200)
Basophils Relative: 0.2 %
EOS ABS: 0 {cells}/uL — AB (ref 15–500)
Eosinophils Relative: 0 %
HCT: 32.8 % — ABNORMAL LOW (ref 35.0–45.0)
HEMOGLOBIN: 10.1 g/dL — AB (ref 11.7–15.5)
Lymphs Abs: 976 cells/uL (ref 850–3900)
MCH: 26 pg — AB (ref 27.0–33.0)
MCHC: 30.8 g/dL — AB (ref 32.0–36.0)
MCV: 84.5 fL (ref 80.0–100.0)
MONOS PCT: 7.1 %
MPV: 11.8 fL (ref 7.5–12.5)
Neutro Abs: 2825 cells/uL (ref 1500–7800)
Neutrophils Relative %: 68.9 %
PLATELETS: 216 10*3/uL (ref 140–400)
RBC: 3.88 10*6/uL (ref 3.80–5.10)
RDW: 13.4 % (ref 11.0–15.0)
Total Lymphocyte: 23.8 %
WBC mixed population: 291 cells/uL (ref 200–950)
WBC: 4.1 10*3/uL (ref 3.8–10.8)

## 2016-09-21 LAB — C-REACTIVE PROTEIN: CRP: 2.6 mg/L (ref <8.0)

## 2016-09-29 ENCOUNTER — Other Ambulatory Visit (INDEPENDENT_AMBULATORY_CARE_PROVIDER_SITE_OTHER): Payer: Self-pay | Admitting: *Deleted

## 2016-09-29 ENCOUNTER — Encounter (INDEPENDENT_AMBULATORY_CARE_PROVIDER_SITE_OTHER): Payer: Self-pay | Admitting: *Deleted

## 2016-09-29 DIAGNOSIS — K5190 Ulcerative colitis, unspecified, without complications: Secondary | ICD-10-CM

## 2016-10-19 LAB — HEMOGLOBIN AND HEMATOCRIT, BLOOD
HEMATOCRIT: 32.3 % — AB (ref 35.0–45.0)
HEMOGLOBIN: 10 g/dL — AB (ref 11.7–15.5)

## 2016-10-24 ENCOUNTER — Other Ambulatory Visit (INDEPENDENT_AMBULATORY_CARE_PROVIDER_SITE_OTHER): Payer: Self-pay | Admitting: *Deleted

## 2016-10-24 DIAGNOSIS — K5190 Ulcerative colitis, unspecified, without complications: Secondary | ICD-10-CM

## 2016-10-24 DIAGNOSIS — K279 Peptic ulcer, site unspecified, unspecified as acute or chronic, without hemorrhage or perforation: Secondary | ICD-10-CM

## 2016-10-24 DIAGNOSIS — D500 Iron deficiency anemia secondary to blood loss (chronic): Secondary | ICD-10-CM

## 2016-10-26 ENCOUNTER — Other Ambulatory Visit (HOSPITAL_COMMUNITY): Payer: Self-pay | Admitting: Adult Health

## 2016-10-26 ENCOUNTER — Encounter (HOSPITAL_COMMUNITY): Payer: Medicaid Other | Attending: Oncology | Admitting: Oncology

## 2016-10-26 ENCOUNTER — Encounter (HOSPITAL_COMMUNITY): Payer: Self-pay | Admitting: Oncology

## 2016-10-26 ENCOUNTER — Encounter (HOSPITAL_COMMUNITY): Payer: Medicaid Other

## 2016-10-26 VITALS — BP 138/84 | HR 63 | Temp 97.8°F | Resp 16 | Wt 173.2 lb

## 2016-10-26 DIAGNOSIS — C50411 Malignant neoplasm of upper-outer quadrant of right female breast: Secondary | ICD-10-CM | POA: Insufficient documentation

## 2016-10-26 DIAGNOSIS — Z79899 Other long term (current) drug therapy: Secondary | ICD-10-CM | POA: Insufficient documentation

## 2016-10-26 DIAGNOSIS — Z91013 Allergy to seafood: Secondary | ICD-10-CM | POA: Insufficient documentation

## 2016-10-26 DIAGNOSIS — Z794 Long term (current) use of insulin: Secondary | ICD-10-CM | POA: Insufficient documentation

## 2016-10-26 DIAGNOSIS — R5383 Other fatigue: Secondary | ICD-10-CM | POA: Insufficient documentation

## 2016-10-26 DIAGNOSIS — Z171 Estrogen receptor negative status [ER-]: Secondary | ICD-10-CM | POA: Insufficient documentation

## 2016-10-26 DIAGNOSIS — Z833 Family history of diabetes mellitus: Secondary | ICD-10-CM | POA: Insufficient documentation

## 2016-10-26 DIAGNOSIS — Z91010 Allergy to peanuts: Secondary | ICD-10-CM | POA: Insufficient documentation

## 2016-10-26 DIAGNOSIS — Z888 Allergy status to other drugs, medicaments and biological substances status: Secondary | ICD-10-CM | POA: Diagnosis not present

## 2016-10-26 DIAGNOSIS — Z8249 Family history of ischemic heart disease and other diseases of the circulatory system: Secondary | ICD-10-CM | POA: Diagnosis not present

## 2016-10-26 DIAGNOSIS — Z9221 Personal history of antineoplastic chemotherapy: Secondary | ICD-10-CM | POA: Insufficient documentation

## 2016-10-26 DIAGNOSIS — Z807 Family history of other malignant neoplasms of lymphoid, hematopoietic and related tissues: Secondary | ICD-10-CM | POA: Diagnosis not present

## 2016-10-26 DIAGNOSIS — D509 Iron deficiency anemia, unspecified: Secondary | ICD-10-CM | POA: Diagnosis not present

## 2016-10-26 DIAGNOSIS — Z923 Personal history of irradiation: Secondary | ICD-10-CM | POA: Insufficient documentation

## 2016-10-26 DIAGNOSIS — E109 Type 1 diabetes mellitus without complications: Secondary | ICD-10-CM | POA: Insufficient documentation

## 2016-10-26 DIAGNOSIS — K5190 Ulcerative colitis, unspecified, without complications: Secondary | ICD-10-CM | POA: Insufficient documentation

## 2016-10-26 DIAGNOSIS — K219 Gastro-esophageal reflux disease without esophagitis: Secondary | ICD-10-CM | POA: Diagnosis not present

## 2016-10-26 LAB — CBC WITH DIFFERENTIAL/PLATELET
Basophils Absolute: 0 10*3/uL (ref 0.0–0.1)
Basophils Relative: 0 %
EOS PCT: 0 %
Eosinophils Absolute: 0 10*3/uL (ref 0.0–0.7)
HCT: 32.6 % — ABNORMAL LOW (ref 36.0–46.0)
Hemoglobin: 10.2 g/dL — ABNORMAL LOW (ref 12.0–15.0)
LYMPHS ABS: 1.1 10*3/uL (ref 0.7–4.0)
LYMPHS PCT: 29 %
MCH: 26 pg (ref 26.0–34.0)
MCHC: 31.3 g/dL (ref 30.0–36.0)
MCV: 83 fL (ref 78.0–100.0)
MONOS PCT: 9 %
Monocytes Absolute: 0.4 10*3/uL (ref 0.1–1.0)
Neutro Abs: 2.4 10*3/uL (ref 1.7–7.7)
Neutrophils Relative %: 62 %
PLATELETS: 198 10*3/uL (ref 150–400)
RBC: 3.93 MIL/uL (ref 3.87–5.11)
RDW: 14.3 % (ref 11.5–15.5)
WBC: 3.9 10*3/uL — AB (ref 4.0–10.5)

## 2016-10-26 LAB — COMPREHENSIVE METABOLIC PANEL
ALT: 13 U/L — AB (ref 14–54)
AST: 18 U/L (ref 15–41)
Albumin: 3.6 g/dL (ref 3.5–5.0)
Alkaline Phosphatase: 75 U/L (ref 38–126)
Anion gap: 6 (ref 5–15)
BUN: 12 mg/dL (ref 6–20)
CHLORIDE: 105 mmol/L (ref 101–111)
CO2: 28 mmol/L (ref 22–32)
CREATININE: 0.67 mg/dL (ref 0.44–1.00)
Calcium: 8.8 mg/dL — ABNORMAL LOW (ref 8.9–10.3)
Glucose, Bld: 359 mg/dL — ABNORMAL HIGH (ref 65–99)
POTASSIUM: 4.4 mmol/L (ref 3.5–5.1)
Sodium: 139 mmol/L (ref 135–145)
TOTAL PROTEIN: 6.4 g/dL — AB (ref 6.5–8.1)
Total Bilirubin: 0.4 mg/dL (ref 0.3–1.2)

## 2016-10-26 LAB — IRON AND TIBC
Iron: 29 ug/dL (ref 28–170)
SATURATION RATIOS: 7 % — AB (ref 10.4–31.8)
TIBC: 431 ug/dL (ref 250–450)
UIBC: 402 ug/dL

## 2016-10-26 LAB — VITAMIN B12: VITAMIN B 12: 162 pg/mL — AB (ref 180–914)

## 2016-10-26 LAB — FERRITIN: Ferritin: 5 ng/mL — ABNORMAL LOW (ref 11–307)

## 2016-10-26 LAB — FOLATE: FOLATE: 12 ng/mL (ref >5.9)

## 2016-10-26 NOTE — Progress Notes (Signed)
[place]  PROGRESS NOTE  Patient Care Team: [name], MD as PCP - General (Unknown Physician Specialty)  CHIEF COMPLAINTS/PURPOSE OF CONSULTATION:     Malignant neoplasm of upper-outer quadrant of right female breast ([place])   04/29/2015 Initial Biopsy    upper outer quadrant mass R breast 2.9 cm by ultrasound, high grade invasive ductal carcinoma ER< 1%, PR 1%, HER -2 not amplified, Ki-67 66%      05/13/2015 Cancer Staging    pT1cN0      05/13/2015 Surgery    lumpectomy and sentinel node biopsies X 3, 2 cm tumor, 3 negative nodes       05/21/2015 Imaging    CT [facility] with postsurgical changes related to R breast lympectomy and R axillary LN dissection, no findings suspicious for metastatic disease      06/12/2015 Procedure    Port placement by Dr. Anthony Sar      06/17/2015 Imaging    MUGA- Normal left ventricular ejection fraction equal to 62.9%.      06/25/2015 - 08/13/2015 Chemotherapy    AC x 4      08/27/2015 - 09/03/2015 Chemotherapy    Weekly Taxol x 2 cycles       09/11/2015 - 09/15/2015 Hospital Admission    Neutropenic fever, ARF secondary to antibiotics      10/01/2015 - 11/12/2015 Chemotherapy    Taxotere 75 mg/m2 Q3 weeks x 3 cycles        10/01/2015 Treatment Plan Change    Taxol changed to Taxotere given hospitalization.        - 01/14/2016 Radiation Therapy    Radiation in [facility]      05/11/2016 Mammogram    IMPRESSION: No evidence of malignancy within either breast. Expected postsurgical changes within the right breast.       HISTORY OF PRESENTING ILLNESS:  Brittney [name] 46 y.o. 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  Patient states that she has been doing well since her last visit. She gets occasional discomfort in her right axilla but denies palpating any new masses in her axilla or her breast.  She has not had any new health issues.   Denies chest pain, SOB, abdominal pain, or any other concerns.   MEDICAL HISTORY:  Past Medical History:  Diagnosis Date  . Breast cancer ([place])   . Breast cancer of upper-outer quadrant of right female breast ([place]) 06/11/2015   Triple negative, 2 cm   . Chemotherapy induced neutropenia ([place]) 09/10/2015  . Diabetes mellitus ([place])    Type 1 over 15 yrs  . Environmental allergies   . GERD (gastroesophageal reflux disease)   . Iron deficiency anemia due to chronic blood loss 06/12/2015  . Personal history of chemotherapy   . Personal history of radiation therapy   . 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 CESAREAN SECTION    . COLONOSCOPY N/A 10/09/2013   Procedure: COLONOSCOPY;  Surgeon: Rogene Houston, MD;  Location: [facility] ENDO SUITE;  Service: Endoscopy;  Laterality: N/A;  100  . DILATION AND CURETTAGE OF UTERUS     x 2 for miscarriages  . ESOPHAGOGASTRODUODENOSCOPY    . FLEXIBLE SIGMOIDOSCOPY    . FLEXIBLE SIGMOIDOSCOPY  07/27/2011   Procedure: FLEXIBLE SIGMOIDOSCOPY;  Surgeon: Rogene Houston, MD;  Location: [facility]  ENDO SUITE;  Service: Endoscopy;  Laterality: N/A;  100    SOCIAL HISTORY: Social History   Social History  . Marital status: Single    Spouse name: N/A  . Number of children: N/A  . Years of education: N/A   Occupational History  . Not on file.   Social History Main Topics  . Smoking status: Never Smoker  . Smokeless tobacco: Never Used  . Alcohol use No  . Drug use: No  . Sexual activity: Not on file   Other Topics Concern  . Not on file   Social History Narrative  . No narrative on file   She is single. Has a 49 year old daughter. Works in home health care. Also going to school. Has to sit out her very last term in order to do her chemotherapy. Working toward RNA/CNA.  She does not smoke.  She loves to read, spend time with her family. She used  to like to shop but doesn't have a lot of money to spend now, so it's at the bottom of the list.   FAMILY HISTORY: Family History  Problem Relation Age of Onset  . Non-Hodgkin's lymphoma Mother   . Hypertension Mother   . Diabetes Mellitus II Mother   . Hypertension Father   . Diabetes Mellitus II Father   . Colon cancer Cousin   . Breast cancer Maternal Aunt    Mom diagnosed in 2013 with non-hodgkin's lymphoma. Had chemotherapy, radiation, more chemotherapy, and stem-cell at [facility] using her cells. Her mother has been in remission since. Mother is aged 104 this month (June 20th, 2017). Father is 32 as well; has high blood pressure. PTSD. Has been treated at the [facility] in [place]. She believes he has high cholesterol too, like her mother. Otherwise, he is "alright I guess."  Has an older brother, and a younger sister. No one else in her immediate family with cancer.   ALLERGIES:  is allergic to ace inhibitors; adhesive [tape]; pravastatin sodium; statins; peanuts [peanut oil]; and shellfish allergy.  MEDICATIONS:  Current Outpatient Prescriptions  Medication Sig Dispense Refill  . acetaminophen (TYLENOL) 500 MG tablet Take 1,000 mg by mouth every 6 (six) hours as needed. For pain    . acyclovir (ZOVIRAX) 200 MG capsule Take 200 mg by mouth 2 (two) times daily. Reported on 07/23/2015    . albuterol (PROVENTIL HFA;VENTOLIN HFA) 108 (90 Base) MCG/ACT inhaler Inhale into the lungs every 6 (six) hours as needed for wheezing or shortness of breath.    . APRISO 0.375 g 24 hr capsule TAKE FOUR (4) CAPSULES BY MOUTH EVERY MORNING 120 capsule 5  . clotrimazole-betamethasone (LOTRISONE) cream Apply 1 application topically 2 (two) times daily. Apply to groin (Patient taking differently: Apply 1 application topically as needed. Apply to groin) 30 g 0  . EPINEPHrine (EPIPEN) 0.3 mg/0.3 mL SOAJ injection Inject into the muscle as needed.    [name] FLUoxetine (PROZAC) 40 MG capsule Take 1 capsule  (40 mg total) by mouth daily. (Patient taking differently: Take 80 mg by mouth daily. ) 30 capsule 3  . gabapentin (NEURONTIN) 300 MG capsule Take 600 mg by mouth at bedtime.   6  . HYDROcodone-acetaminophen (NORCO/VICODIN) 5-325 MG tablet TAKE ONE TABLET BY MOUTH EVERY 4 HOURS AS NEEDED FOR MODERATE PAIN 30 tablet 0  . ibuprofen (ADVIL,MOTRIN) 200 MG tablet Take 400 mg by mouth every 6 (six) hours as needed for moderate pain.    [name] insulin glargine (LANTUS) 100  UNIT/ML injection Inject 18-20 Units into the skin at bedtime. 18 to 20 units    . insulin lispro protamine-insulin lispro (HUMALOG 50/50) (50-50) 100 UNIT/ML SUSP Inject 4 Units into the skin 4 (four) times daily. With each meal    . loratadine (CLARITIN) 10 MG tablet TAKE ONE TABLET BY MOUTH DAILY FOR HIVES.  6  . LORazepam (ATIVAN) 0.5 MG tablet Take 1 tablet (0.5 mg total) by mouth every 8 (eight) hours. For nausea or anxiety 45 tablet 0  . mesalamine (ROWASA) 4 g enema Place 60 mLs (4 g total) rectally at bedtime. (Patient taking differently: Place 4 g rectally as needed. ) 30 Bottle 1  . mometasone (NASONEX) 50 MCG/ACT nasal spray Place 2 sprays into the nose daily as needed.     . mupirocin ointment (BACTROBAN) 2 % Place 1 application into the nose 2 (two) times daily. (Patient taking differently: Place 1 application into the nose 2 (two) times daily as needed (irritation). ) 22 g 0  . omeprazole (PRILOSEC) 20 MG capsule Take 20 mg by mouth 2 (two) times daily before a meal.     . ondansetron (ZOFRAN ODT) 8 MG disintegrating tablet Take 1 tablet (8 mg total) by mouth every 8 (eight) hours as needed for nausea or vomiting. 30 tablet 2  . polyethylene glycol powder (GLYCOLAX/MIRALAX) powder Take 8.5 g by mouth daily. (Patient taking differently: Take 8.5 g by mouth daily as needed for mild constipation. ) 255 g 5  . promethazine (PHENERGAN) 25 MG tablet Take 1 tablet (25 mg total) by mouth every 6 (six) hours as needed for nausea or  vomiting. 30 tablet 2   No current facility-administered medications for this visit.    Facility-Administered Medications Ordered in Other Visits  Medication Dose Route Frequency Provider Last Rate Last Dose  . 0.9 %  sodium chloride infusion   Intravenous Continuous Kefalas, Manon Hilding, PA-C       Review of Systems  Constitutional: Negative.   HENT: Negative.   Eyes: Negative.   Respiratory: Negative.  Negative for shortness of breath.   Cardiovascular: Negative.  Negative for chest pain.  Gastrointestinal: Negative.  Negative for abdominal pain.  Genitourinary: Negative.   Musculoskeletal: Positive for neck pain.  Skin: Negative.   Neurological: Positive for tingling (hands and feet).  Endo/Heme/Allergies: Negative.   Psychiatric/Behavioral: Negative.   All other systems reviewed and are negative. 14 point ROS was done and is otherwise as detailed above or in HPI  PHYSICAL EXAMINATION: ECOG PERFORMANCE STATUS: 1 - Symptomatic but completely ambulatory  Vitals:   10/26/16 1046  BP: 138/84  Pulse: 63  Resp: 16  Temp: 97.8 F (36.6 C)  SpO2: 100%   Filed Weights   10/26/16 1046  Weight: 173 lb 3.2 oz (78.6 kg)   Physical Exam  Constitutional: She is oriented to person, place, and time and well-developed, well-nourished, and in no distress.  HENT:  Head: Normocephalic and atraumatic.  Nose: Nose normal.  Mouth/Throat: Oropharynx is clear and moist. No oropharyngeal exudate.  Eyes: Pupils are equal, round, and reactive to light. Conjunctivae and EOM are normal. Right eye exhibits no discharge. Left eye exhibits no discharge. No scleral icterus.  Neck: Normal range of motion. Neck supple. No tracheal deviation present. No thyromegaly present.  Cardiovascular: Normal rate, regular rhythm and normal heart sounds.  Exam reveals no gallop and no friction rub.   No murmur heard. Pulmonary/Chest: Effort normal and breath sounds normal. She has no wheezes. She  has no rales.     Abdominal: Soft. Bowel sounds are normal. She exhibits no distension and no mass. There is no tenderness. There is no rebound and no guarding.  Musculoskeletal: Normal range of motion. She exhibits no edema.  Lymphadenopathy:    She has no cervical adenopathy.  Neurological: She is alert and oriented to person, place, and time. She has normal reflexes. No cranial nerve deficit. Gait normal. Coordination normal.  Skin: Skin is warm and dry. No rash noted.  Psychiatric: Mood, memory, affect and judgment normal.  Nursing note and vitals reviewed.  LABORATORY DATA: I have reviewed the data as listed. CBC Latest Ref Rng & Units 10/26/2016 10/19/2016 09/20/2016  WBC 4.0 - 10.5 K/uL 3.9(L) - 4.1  Hemoglobin 12.0 - 15.0 g/dL 10.2(L) 10.0(L) 10.1(L)  Hematocrit 36.0 - 46.0 % 32.6(L) 32.3(L) 32.8(L)  Platelets 150 - 400 K/uL 198 - 216   CMP Latest Ref Rng & Units 07/29/2016 01/25/2016 12/01/2015  Glucose 65 - 99 mg/dL - 71 206(H)  BUN 6 - 20 mg/dL - 10 16  Creatinine 0.44 - 1.00 mg/dL 0.60 0.53 0.42(L)  Sodium 135 - 145 mmol/L - 138 137  Potassium 3.5 - 5.1 mmol/L - 3.6 3.8  Chloride 101 - 111 mmol/L - 101 103  CO2 22 - 32 mmol/L - 28 28  Calcium 8.9 - 10.3 mg/dL - 8.8(L) 9.0  Total Protein 6.5 - 8.1 g/dL - 6.7 6.4(L)  Total Bilirubin 0.3 - 1.2 mg/dL - 0.5 0.6  Alkaline Phos 38 - 126 U/L - 83 77  AST 15 - 41 U/L - 22 19  ALT 14 - 54 U/L - 17 16   RADIOGRAPHY: I have reviewed the data as listed below.  CT Head 10/29/2015 IMPRESSION: No explanation for headache.  No evidence of metastatic disease.  PATHOLOGY:   ASSESSMENT & PLAN:  pT1cN0 triple negative carcinoma of the R breast S/P lumpectomy and sentinel LN procedure Iron deficiency Anemia Ulcerative Colitis followed by Dr. Laural Golden   Clinically NED on breast exam today. She knows to call us if she feels any breast masses.   I have schedule a bilateral diagnostic mammogram with TOMO to be performed in May 2019 for her annual  mammogram.  Labs reviewed with the patient today. Hemoglobin stable.  She will return for follow up in 6 months with labs.    All questions were answered. The patient knows to call the clinic with any problems, questions or concerns.  Orders Placed This Encounter  Procedures  . MM DIAG BREAST TOMO BILATERAL    Standing Status:   Future    Standing Expiration Date:   10/26/2017    Order Specific Question:   Reason for Exam (SYMPTOM  OR DIAGNOSIS REQUIRED)    Answer:   annual mammogram    Order Specific Question:   Is the patient pregnant?    Answer:   No    Order Specific Question:   Preferred imaging location?    Answer:   [facility]  . CBC with Differential    Standing Status:   Future    Standing Expiration Date:   10/26/2017  . Comprehensive metabolic panel    Standing Status:   Future    Standing Expiration Date:   10/26/2017  . Iron and TIBC    Standing Status:   Future    Standing Expiration Date:   10/26/2017  . Ferritin    Standing Status:   Future    Standing Expiration  Date:   10/26/2017      This note was electronically signed.  Twana First, MD  10/26/2016 10:47 AM

## 2016-10-26 NOTE — Patient Instructions (Signed)
[place] at [facility] Discharge Instructions  RECOMMENDATIONS MADE BY THE CONSULTANT AND ANY TEST RESULTS WILL BE SENT TO YOUR REFERRING PHYSICIAN.  You were seen today by Dr. Twana First Follow up in 6 months   Thank you for choosing [place] at [facility] to provide your oncology and hematology care.  To afford each patient quality time with our provider, please arrive at least 15 minutes before your scheduled appointment time.    If you have a lab appointment with the [facility] please come in thru the  Main Entrance and check in at the main information desk  You need to re-schedule your appointment should you arrive 10 or more minutes late.  We strive to give you quality time with our providers, and arriving late affects you and other patients whose appointments are after yours.  Also, if you no show three or more times for appointments you may be dismissed from the clinic at the providers discretion.     Again, thank you for choosing [facility].  Our hope is that these requests will decrease the amount of time that you wait before being seen by our physicians.       _____________________________________________________________  Should you have questions after your visit to [facility], please contact our office at [phone] between the hours of 8:30 a.m. and 4:30 p.m.  Voicemails left after 4:30 p.m. will not be returned until the following business day.  For prescription refill requests, have your pharmacy contact our office.       Resources For Cancer Patients and their Caregivers ? American Cancer Society: Can assist with transportation, wigs, general needs, runs Look Good Feel Better.        931-853-2819 ? Cancer Care: Provides financial assistance, online support groups, medication/co-pay assistance.  1-800-813-HOPE (415)859-2656) ? [place] Assists [place]  cancer patients and their families through emotional , educational and financial support.  (612)684-2880 ? [place] DSS Where to apply for food stamps, Medicaid and utility assistance. 640 018 2797 ? RCATS: Transportation to medical appointments. 229-580-9240 ? Social Security Administration: May apply for disability if have a Stage IV cancer. 435-454-7971 712-442-1234 ? [employer], Disability and Transit Services: Assists with nutrition, care and transit needs. [place] Support Programs: @10RELATIVEDAYS @ > Cancer Support Group  2nd Tuesday of the month 1pm-2pm, Journey Room  > Creative Journey  3rd Tuesday of the month 1130am-1pm, Journey Room  > Look Good Feel Better  1st Wednesday of the month 10am-12 noon, Journey Room (Call [place] to register [phone])

## 2016-10-27 ENCOUNTER — Other Ambulatory Visit (INDEPENDENT_AMBULATORY_CARE_PROVIDER_SITE_OTHER): Payer: Self-pay | Admitting: *Deleted

## 2016-10-27 ENCOUNTER — Encounter (INDEPENDENT_AMBULATORY_CARE_PROVIDER_SITE_OTHER): Payer: Self-pay | Admitting: *Deleted

## 2016-10-27 DIAGNOSIS — K5190 Ulcerative colitis, unspecified, without complications: Secondary | ICD-10-CM

## 2016-10-27 DIAGNOSIS — D500 Iron deficiency anemia secondary to blood loss (chronic): Secondary | ICD-10-CM

## 2016-10-27 DIAGNOSIS — K279 Peptic ulcer, site unspecified, unspecified as acute or chronic, without hemorrhage or perforation: Secondary | ICD-10-CM

## 2016-10-31 ENCOUNTER — Encounter (HOSPITAL_BASED_OUTPATIENT_CLINIC_OR_DEPARTMENT_OTHER): Payer: Medicaid Other

## 2016-10-31 ENCOUNTER — Encounter (HOSPITAL_COMMUNITY): Payer: Self-pay

## 2016-10-31 VITALS — BP 142/78 | HR 78 | Temp 98.0°F | Resp 16

## 2016-10-31 DIAGNOSIS — D500 Iron deficiency anemia secondary to blood loss (chronic): Secondary | ICD-10-CM

## 2016-10-31 DIAGNOSIS — D509 Iron deficiency anemia, unspecified: Secondary | ICD-10-CM | POA: Diagnosis not present

## 2016-10-31 MED ORDER — SODIUM CHLORIDE 0.9 % IV SOLN
INTRAVENOUS | Status: DC
  Administered 2016-10-31: 10:00:00 via INTRAVENOUS

## 2016-10-31 MED ORDER — SODIUM CHLORIDE 0.9% FLUSH
10.0000 mL | Freq: Once | INTRAVENOUS | Status: AC
  Administered 2016-10-31: 10 mL via INTRAVENOUS

## 2016-10-31 MED ORDER — SODIUM CHLORIDE 0.9 % IV SOLN
750.0000 mg | Freq: Once | INTRAVENOUS | Status: AC
  Administered 2016-10-31: 750 mg via INTRAVENOUS
  Filled 2016-10-31: qty 15

## 2016-10-31 NOTE — Progress Notes (Signed)
To treatment area for iron infusion today.  No complaints voiced today.  Good blood return noted with peripheral IV site.  No complaints of pain with flush.   Patient tolerated iron infusion with no complaints voiced.  Good blood return noted before and after administration of iron.  Peripheral IV site clean and dry with no bruising or swelling noted at site.  Band aid applied.  VSS with discharge and left ambulatory.  No s/s of distress noted.

## 2016-10-31 NOTE — Patient Instructions (Signed)
[facility] at [facility]  Discharge Instructions:  You received an iron infusion today.  _______________________________________________________________  Thank you for choosing [facility] at [facility] to provide your oncology and hematology care.  To afford each patient quality time with our providers, please arrive at least 15 minutes before your scheduled appointment.  You need to re-schedule your appointment if you arrive 10 or more minutes late.  We strive to give you quality time with our providers, and arriving late affects you and other patients whose appointments are after yours.  Also, if you no show three or more times for appointments you may be dismissed from the clinic.  Again, thank you for choosing [facility] at [facility]. Our hope is that these requests will allow you access to exceptional care and in a timely manner. _______________________________________________________________  If you have questions after your visit, please contact our office at [phone] between the hours of 8:30 a.m. and 5:00 p.m. Voicemails left after 4:30 p.m. will not be returned until the following business day. _______________________________________________________________  For prescription refill requests, have your pharmacy contact our office. _______________________________________________________________  Recommendations made by the consultant and any test results will be sent to your referring physician. _______________________________________________________________ 

## 2016-11-02 ENCOUNTER — Encounter (HOSPITAL_COMMUNITY): Payer: Self-pay

## 2016-11-02 ENCOUNTER — Encounter (HOSPITAL_COMMUNITY): Payer: Medicaid Other | Attending: Oncology

## 2016-11-02 VITALS — BP 130/73 | HR 82 | Temp 98.4°F | Resp 16

## 2016-11-02 DIAGNOSIS — D500 Iron deficiency anemia secondary to blood loss (chronic): Secondary | ICD-10-CM

## 2016-11-02 DIAGNOSIS — E538 Deficiency of other specified B group vitamins: Secondary | ICD-10-CM

## 2016-11-02 MED ORDER — CYANOCOBALAMIN 1000 MCG/ML IJ SOLN
INTRAMUSCULAR | Status: AC
  Filled 2016-11-02: qty 1

## 2016-11-02 MED ORDER — CYANOCOBALAMIN 1000 MCG/ML IJ SOLN
1000.0000 ug | Freq: Once | INTRAMUSCULAR | Status: AC
  Administered 2016-11-02: 1000 ug via INTRAMUSCULAR

## 2016-11-02 NOTE — Progress Notes (Signed)
Brittney [name] presents today for injection per the provider's orders.  B12 administration without incident; see MAR for injection details.  Patient tolerated procedure well and without incident.  No questions or complaints noted at this time. Discharged ambulatory.

## 2016-11-09 ENCOUNTER — Encounter (HOSPITAL_COMMUNITY): Payer: Self-pay

## 2016-11-09 ENCOUNTER — Encounter (HOSPITAL_COMMUNITY): Payer: Medicaid Other | Attending: Oncology

## 2016-11-09 ENCOUNTER — Ambulatory Visit (HOSPITAL_COMMUNITY): Payer: Medicaid Other

## 2016-11-09 VITALS — BP 129/79 | HR 70 | Temp 98.2°F | Resp 16

## 2016-11-09 DIAGNOSIS — K5190 Ulcerative colitis, unspecified, without complications: Secondary | ICD-10-CM | POA: Insufficient documentation

## 2016-11-09 DIAGNOSIS — Z9221 Personal history of antineoplastic chemotherapy: Secondary | ICD-10-CM | POA: Insufficient documentation

## 2016-11-09 DIAGNOSIS — Z91013 Allergy to seafood: Secondary | ICD-10-CM | POA: Insufficient documentation

## 2016-11-09 DIAGNOSIS — K219 Gastro-esophageal reflux disease without esophagitis: Secondary | ICD-10-CM | POA: Insufficient documentation

## 2016-11-09 DIAGNOSIS — R5383 Other fatigue: Secondary | ICD-10-CM | POA: Insufficient documentation

## 2016-11-09 DIAGNOSIS — D509 Iron deficiency anemia, unspecified: Secondary | ICD-10-CM | POA: Insufficient documentation

## 2016-11-09 DIAGNOSIS — Z8249 Family history of ischemic heart disease and other diseases of the circulatory system: Secondary | ICD-10-CM | POA: Insufficient documentation

## 2016-11-09 DIAGNOSIS — Z833 Family history of diabetes mellitus: Secondary | ICD-10-CM | POA: Insufficient documentation

## 2016-11-09 DIAGNOSIS — E538 Deficiency of other specified B group vitamins: Secondary | ICD-10-CM | POA: Diagnosis not present

## 2016-11-09 DIAGNOSIS — C50411 Malignant neoplasm of upper-outer quadrant of right female breast: Secondary | ICD-10-CM | POA: Insufficient documentation

## 2016-11-09 DIAGNOSIS — Z807 Family history of other malignant neoplasms of lymphoid, hematopoietic and related tissues: Secondary | ICD-10-CM | POA: Insufficient documentation

## 2016-11-09 DIAGNOSIS — Z794 Long term (current) use of insulin: Secondary | ICD-10-CM | POA: Insufficient documentation

## 2016-11-09 DIAGNOSIS — Z888 Allergy status to other drugs, medicaments and biological substances status: Secondary | ICD-10-CM | POA: Insufficient documentation

## 2016-11-09 DIAGNOSIS — Z923 Personal history of irradiation: Secondary | ICD-10-CM | POA: Insufficient documentation

## 2016-11-09 DIAGNOSIS — E109 Type 1 diabetes mellitus without complications: Secondary | ICD-10-CM | POA: Insufficient documentation

## 2016-11-09 DIAGNOSIS — Z79899 Other long term (current) drug therapy: Secondary | ICD-10-CM | POA: Insufficient documentation

## 2016-11-09 DIAGNOSIS — Z91010 Allergy to peanuts: Secondary | ICD-10-CM | POA: Insufficient documentation

## 2016-11-09 DIAGNOSIS — Z171 Estrogen receptor negative status [ER-]: Secondary | ICD-10-CM | POA: Insufficient documentation

## 2016-11-09 DIAGNOSIS — D500 Iron deficiency anemia secondary to blood loss (chronic): Secondary | ICD-10-CM

## 2016-11-09 MED ORDER — CYANOCOBALAMIN 1000 MCG/ML IJ SOLN
INTRAMUSCULAR | Status: AC
  Filled 2016-11-09: qty 1

## 2016-11-09 MED ORDER — CYANOCOBALAMIN 1000 MCG/ML IJ SOLN
1000.0000 ug | Freq: Once | INTRAMUSCULAR | Status: AC
  Administered 2016-11-09: 1000 ug via INTRAMUSCULAR

## 2016-11-09 NOTE — Progress Notes (Signed)
Brittney [name] presents today for injection per MD orders. B12 1,038mg administered IM  in left Upper Arm. Administration without incident. Patient tolerated well.  Treatment given per orders. Patient tolerated it well without problems. Vitals stable and discharged home from clinic ambulatory. Follow up as scheduled.

## 2016-11-09 NOTE — Patient Instructions (Signed)
[place] at [facility] Discharge Instructions  RECOMMENDATIONS MADE BY THE CONSULTANT AND ANY TEST RESULTS WILL BE SENT TO YOUR REFERRING PHYSICIAN.  B12 injection today Follow up as scheduled.  Thank you for choosing [place] at [facility] to provide your oncology and hematology care.  To afford each patient quality time with our provider, please arrive at least 15 minutes before your scheduled appointment time.    If you have a lab appointment with the [place] please come in thru the  Main Entrance and check in at the main information desk  You need to re-schedule your appointment should you arrive 10 or more minutes late.  We strive to give you quality time with our providers, and arriving late affects you and other patients whose appointments are after yours.  Also, if you no show three or more times for appointments you may be dismissed from the clinic at the providers discretion.     Again, thank you for choosing [facility].  Our hope is that these requests will decrease the amount of time that you wait before being seen by our physicians.       _____________________________________________________________  Should you have questions after your visit to [facility], please contact our office at [phone] between the hours of 8:30 a.m. and 4:30 p.m.  Voicemails left after 4:30 p.m. will not be returned until the following business day.  For prescription refill requests, have your pharmacy contact our office.       Resources For Cancer Patients and their Caregivers ? American Cancer Society: Can assist with transportation, wigs, general needs, runs Look Good Feel Better.        912-716-5302 ? Cancer Care: Provides financial assistance, online support groups, medication/co-pay assistance.  1-800-813-HOPE 236-484-2320) ? [place] Assists [place] cancer patients and  their families through emotional , educational and financial support.  502-820-6279 ? [place] DSS Where to apply for food stamps, Medicaid and utility assistance. (548) 724-1326 ? RCATS: Transportation to medical appointments. 313-006-4929 ? Social Security Administration: May apply for disability if have a Stage IV cancer. 3390885035 947 104 1994 ? [employer], Disability and Transit Services: Assists with nutrition, care and transit needs. [place] Support Programs: @10RELATIVEDAYS @ > Cancer Support Group  2nd Tuesday of the month 1pm-2pm, Journey Room  > Creative Journey  3rd Tuesday of the month 1130am-1pm, Journey Room  > Look Good Feel Better  1st Wednesday of the month 10am-12 noon, Journey Room (Call [place] to register [phone])

## 2016-11-16 ENCOUNTER — Encounter (HOSPITAL_BASED_OUTPATIENT_CLINIC_OR_DEPARTMENT_OTHER): Payer: Medicaid Other

## 2016-11-16 ENCOUNTER — Encounter (HOSPITAL_COMMUNITY): Payer: Self-pay

## 2016-11-16 VITALS — BP 124/60 | HR 70 | Temp 97.9°F | Resp 18 | Wt 173.6 lb

## 2016-11-16 DIAGNOSIS — E538 Deficiency of other specified B group vitamins: Secondary | ICD-10-CM

## 2016-11-16 DIAGNOSIS — D500 Iron deficiency anemia secondary to blood loss (chronic): Secondary | ICD-10-CM

## 2016-11-16 MED ORDER — CYANOCOBALAMIN 1000 MCG/ML IJ SOLN
1000.0000 ug | Freq: Once | INTRAMUSCULAR | Status: AC
  Administered 2016-11-16: 1000 ug via INTRAMUSCULAR

## 2016-11-16 MED ORDER — CYANOCOBALAMIN 1000 MCG/ML IJ SOLN
INTRAMUSCULAR | Status: AC
  Filled 2016-11-16: qty 1

## 2016-11-16 NOTE — Patient Instructions (Signed)
[place] at [facility]  Discharge Instructions:  You received a b12 shot today.  _______________________________________________________________  Thank you for choosing [place] at [facility] to provide your oncology and hematology care.  To afford each patient quality time with our providers, please arrive at least 15 minutes before your scheduled appointment.  You need to re-schedule your appointment if you arrive 10 or more minutes late.  We strive to give you quality time with our providers, and arriving late affects you and other patients whose appointments are after yours.  Also, if you no show three or more times for appointments you may be dismissed from the clinic.  Again, thank you for choosing [place] at [place] hope is that these requests will allow you access to exceptional care and in a timely manner. _______________________________________________________________  If you have questions after your visit, please contact our office at [phone] between the hours of 8:30 a.m. and 5:00 p.m. Voicemails left after 4:30 p.m. will not be returned until the following business day. _______________________________________________________________  For prescription refill requests, have your pharmacy contact our office. _______________________________________________________________  Recommendations made by the consultant and any test results will be sent to your referring physician. _______________________________________________________________

## 2016-11-16 NOTE — Progress Notes (Signed)
Patient tolerated vitamin b12 shot with no complaints voiced.  Injection site clean and dry with no bruising or swelling noted at site.  Band aid applied.  VSS with discharge and left ambulatory with family.

## 2016-11-21 LAB — HEMOGLOBIN AND HEMATOCRIT, BLOOD
HCT: 34.9 % — ABNORMAL LOW (ref 35.0–45.0)
HEMOGLOBIN: 10.9 g/dL — AB (ref 11.7–15.5)

## 2016-11-22 ENCOUNTER — Other Ambulatory Visit (INDEPENDENT_AMBULATORY_CARE_PROVIDER_SITE_OTHER): Payer: Self-pay | Admitting: *Deleted

## 2016-11-22 DIAGNOSIS — D509 Iron deficiency anemia, unspecified: Secondary | ICD-10-CM

## 2016-11-22 DIAGNOSIS — K279 Peptic ulcer, site unspecified, unspecified as acute or chronic, without hemorrhage or perforation: Secondary | ICD-10-CM

## 2016-11-23 ENCOUNTER — Encounter (HOSPITAL_COMMUNITY): Payer: Self-pay

## 2016-11-23 ENCOUNTER — Encounter (HOSPITAL_BASED_OUTPATIENT_CLINIC_OR_DEPARTMENT_OTHER): Payer: Medicaid Other

## 2016-11-23 VITALS — BP 130/71 | HR 68 | Temp 98.2°F | Resp 20

## 2016-11-23 DIAGNOSIS — D500 Iron deficiency anemia secondary to blood loss (chronic): Secondary | ICD-10-CM

## 2016-11-23 MED ORDER — CYANOCOBALAMIN 1000 MCG/ML IJ SOLN
1000.0000 ug | Freq: Once | INTRAMUSCULAR | Status: AC
  Administered 2016-11-23: 1000 ug via INTRAMUSCULAR

## 2016-11-23 MED ORDER — CYANOCOBALAMIN 1000 MCG/ML IJ SOLN
INTRAMUSCULAR | Status: AC
  Filled 2016-11-23: qty 1

## 2016-11-23 NOTE — Patient Instructions (Signed)
[place] at [facility] Discharge Instructions  RECOMMENDATIONS MADE BY THE CONSULTANT AND ANY TEST RESULTS WILL BE SENT TO YOUR REFERRING PHYSICIAN.  Received Vit B12 injection today. Follow-up as scheduled. Call clinic for any questions or issues  Thank you for choosing [place] at [facility] to provide your oncology and hematology care.  To afford each patient quality time with our provider, please arrive at least 15 minutes before your scheduled appointment time.    If you have a lab appointment with the [place] please come in thru the  Main Entrance and check in at the main information desk  You need to re-schedule your appointment should you arrive 10 or more minutes late.  We strive to give you quality time with our providers, and arriving late affects you and other patients whose appointments are after yours.  Also, if you no show three or more times for appointments you may be dismissed from the clinic at the providers discretion.     Again, thank you for choosing [facility].  Our hope is that these requests will decrease the amount of time that you wait before being seen by our physicians.       _____________________________________________________________  Should you have questions after your visit to [facility], please contact our office at [phone] between the hours of 8:30 a.m. and 4:30 p.m.  Voicemails left after 4:30 p.m. will not be returned until the following business day.  For prescription refill requests, have your pharmacy contact our office.       Resources For Cancer Patients and their Caregivers ? American Cancer Society: Can assist with transportation, wigs, general needs, runs Look Good Feel Better.        657-726-8674 ? Cancer Care: Provides financial assistance, online support groups, medication/co-pay assistance.  1-800-813-HOPE 240-481-7098) ? [place] Assists [place] cancer patients and their families through emotional , educational and financial support.  ([phone] 180-5249 ? [place] DSS Where to apply for food stamps, Medicaid and utility assistance. 873 395 1970 ? RCATS: Transportation to medical appointments. 671-266-7560 ? Social Security Administration: May apply for disability if have a Stage IV cancer. 843 462 1971 [phone] [phone] ? [employer], Disability and Transit Services: Assists with nutrition, care and transit needs. [place] Support Programs: @[email or web address] @ > Cancer Support Group  2nd Tuesday of the month 1pm-2pm, Journey Room  > Creative Journey  3rd Tuesday of the month 1130am-1pm, Journey Room  > Look Good Feel Better  1st Wednesday of the month 10am-12 noon, Journey Room (Call [place] to register [phone])

## 2016-11-23 NOTE — Progress Notes (Signed)
Brittney [name] tolerated Vit B12 injection well without complaints or incident. VSS Pt discharged self ambulatory in satisfactory condition accompanied by her daughter

## 2016-12-07 ENCOUNTER — Other Ambulatory Visit (INDEPENDENT_AMBULATORY_CARE_PROVIDER_SITE_OTHER): Payer: Self-pay | Admitting: Internal Medicine

## 2016-12-23 ENCOUNTER — Ambulatory Visit (HOSPITAL_COMMUNITY): Payer: Medicaid Other

## 2016-12-30 ENCOUNTER — Encounter (INDEPENDENT_AMBULATORY_CARE_PROVIDER_SITE_OTHER): Payer: Self-pay | Admitting: *Deleted

## 2016-12-30 ENCOUNTER — Other Ambulatory Visit (INDEPENDENT_AMBULATORY_CARE_PROVIDER_SITE_OTHER): Payer: Self-pay | Admitting: *Deleted

## 2016-12-30 DIAGNOSIS — D509 Iron deficiency anemia, unspecified: Secondary | ICD-10-CM

## 2016-12-30 DIAGNOSIS — K279 Peptic ulcer, site unspecified, unspecified as acute or chronic, without hemorrhage or perforation: Secondary | ICD-10-CM

## 2017-01-16 LAB — HEMOGLOBIN AND HEMATOCRIT, BLOOD
HCT: 37.6 % (ref 35.0–45.0)
Hemoglobin: 12.1 g/dL (ref 11.7–15.5)

## 2017-01-18 ENCOUNTER — Other Ambulatory Visit (INDEPENDENT_AMBULATORY_CARE_PROVIDER_SITE_OTHER): Payer: Self-pay | Admitting: *Deleted

## 2017-01-18 DIAGNOSIS — K219 Gastro-esophageal reflux disease without esophagitis: Secondary | ICD-10-CM

## 2017-01-18 DIAGNOSIS — K51918 Ulcerative colitis, unspecified with other complication: Secondary | ICD-10-CM

## 2017-01-18 DIAGNOSIS — K279 Peptic ulcer, site unspecified, unspecified as acute or chronic, without hemorrhage or perforation: Secondary | ICD-10-CM

## 2017-01-18 DIAGNOSIS — K3184 Gastroparesis: Secondary | ICD-10-CM

## 2017-03-02 ENCOUNTER — Other Ambulatory Visit (INDEPENDENT_AMBULATORY_CARE_PROVIDER_SITE_OTHER): Payer: Self-pay | Admitting: *Deleted

## 2017-03-02 ENCOUNTER — Encounter (INDEPENDENT_AMBULATORY_CARE_PROVIDER_SITE_OTHER): Payer: Self-pay | Admitting: *Deleted

## 2017-03-02 DIAGNOSIS — K51918 Ulcerative colitis, unspecified with other complication: Secondary | ICD-10-CM

## 2017-03-02 DIAGNOSIS — K279 Peptic ulcer, site unspecified, unspecified as acute or chronic, without hemorrhage or perforation: Secondary | ICD-10-CM

## 2017-03-02 DIAGNOSIS — K3184 Gastroparesis: Secondary | ICD-10-CM

## 2017-03-02 DIAGNOSIS — K219 Gastro-esophageal reflux disease without esophagitis: Secondary | ICD-10-CM

## 2017-03-16 LAB — HEMOGLOBIN AND HEMATOCRIT, BLOOD
HEMATOCRIT: 33.3 % — AB (ref 35.0–45.0)
HEMOGLOBIN: 10.8 g/dL — AB (ref 11.7–15.5)

## 2017-03-20 ENCOUNTER — Encounter (INDEPENDENT_AMBULATORY_CARE_PROVIDER_SITE_OTHER): Payer: Self-pay | Admitting: Internal Medicine

## 2017-03-20 ENCOUNTER — Ambulatory Visit (INDEPENDENT_AMBULATORY_CARE_PROVIDER_SITE_OTHER): Payer: Medicaid Other | Admitting: Internal Medicine

## 2017-03-20 VITALS — BP 160/84 | HR 72 | Temp 98.2°F | Ht 61.0 in | Wt 178.3 lb

## 2017-03-20 DIAGNOSIS — K5120 Ulcerative (chronic) proctitis without complications: Secondary | ICD-10-CM

## 2017-03-20 NOTE — Progress Notes (Signed)
Subjective:    Patient ID: Brittney [name], female    DOB: 01-04-1971, 47 y.o.   MRN: [id number]  HPI Here today for f/u. She was last seen in September of 2018. Hx of UC and maintained on  Apriso. Her last colonoscopy was in  2015 which revealed: rectal and patchy sigmoid colon disease.   4 months ago her Ferritin was 5 and she received 1 dose of IV  injectafer at the [facility].  She saw some blood about 2 weeks ago and started on the Mesalamine enemas and MIralax (as needed). Her stools are much better now.    Diagnosed  With breast cancer in April 65f2016 with breast cancer. Underwent a lumpectomy by Dr. DAnthony Sar She has finished  chemo and radiation. She tells me she is doing okay. When she has her period, if seems like her UC wants to flare up or she winds up constipated.  She takes Miralax as needed and has had to use enemas for constipation.  She feels bloated with the constipation.  Her BMs are better now. She says she feels like she leans toward constipation. She has a BM almost every day. Her appetite is good for the most part. She has gained 2 pounds since September.   CBC Latest Ref Rng & Units 03/15/2017 01/16/2017 11/21/2016  WBC 4.0 - 10.5 K/uL - - -  Hemoglobin 11.7 - 15.5 g/dL 10.8(L) 12.1 10.9(L)  Hematocrit 35.0 - 45.0 % 33.3(L) 37.6 34.9(L)  Platelets 150 - 400 K/uL - - -    09/20/2016 CRP 2.6    Review of Systems Past Medical History:  Diagnosis Date  . Breast cancer ([facility]   . Breast cancer of upper-outer quadrant of right female breast ([facility] 06/11/2015   Triple negative, 2 cm   . Chemotherapy induced neutropenia ([facility] 09/10/2015  . Diabetes mellitus ([facility]    Type 1 over 15 yrs  . Environmental allergies   . GERD (gastroesophageal reflux disease)   . Iron deficiency anemia due to chronic blood loss 06/12/2015  . Personal history of chemotherapy   . Personal history of radiation therapy   . 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 CESAREAN SECTION    . COLONOSCOPY N/A 10/09/2013   Procedure: COLONOSCOPY;  Surgeon: NRogene Houston MD;  Location[facility];  Service: Endoscopy;  Laterality: N/A;  100  . DILATION AND CURETTAGE OF UTERUS     x 2 for miscarriages  . ESOPHAGOGASTRODUODENOSCOPY    . FLEXIBLE SIGMOIDOSCOPY    . FLEXIBLE SIGMOIDOSCOPY  07/27/2011   Procedure: FLEXIBLE SIGMOIDOSCOPY;  Surgeon: NRogene Houston MD;  Location: [facility];  Service: Endoscopy;  Laterality: N/A;  100    Allergies  Allergen Reactions  . Ace Inhibitors Itching  . Adhesive [Tape] Rash  . Pravastatin Sodium Itching  . Statins Itching  . Peanuts [Peanut Oil] Itching  . Shellfish Allergy Itching and Rash    Current Outpatient Medications on File Prior to Visit  Medication Sig Dispense Refill  . acetaminophen (TYLENOL) 500 MG tablet Take 1,000 mg by mouth every 6 (six) hours as needed. For pain    . acyclovir (ZOVIRAX) 200 MG capsule Take 200 mg by mouth 2 (two) times daily. Reported on 07/23/2015    . albuterol (PROVENTIL HFA;VENTOLIN HFA) 108 (90 Base) MCG/ACT inhaler Inhale into the lungs every 6 (six) hours as needed for wheezing or  shortness of breath.    . APRISO 0.375 g 24 hr capsule TAKE FOUR (4) CAPSULES BY MOUTH EVERY MORNING 120 capsule 5  . clotrimazole-betamethasone (LOTRISONE) cream Apply 1 application topically 2 (two) times daily. Apply to groin (Patient taking differently: Apply 1 application topically as needed. Apply to groin) 30 g 0  . EPINEPHrine (EPIPEN) 0.3 mg/0.3 mL SOAJ injection Inject into the muscle as needed.    [name] FLUoxetine (PROZAC) 40 MG capsule Take 1 capsule (40 mg total) by mouth daily. (Patient taking differently: Take 80 mg by mouth daily. ) 30 capsule 3  . gabapentin (NEURONTIN) 300 MG capsule Take 600 mg by mouth at bedtime.   6  . HYDROcodone-acetaminophen (NORCO/VICODIN) 5-325 MG tablet TAKE ONE TABLET BY MOUTH EVERY 4 HOURS AS NEEDED FOR MODERATE  PAIN 30 tablet 0  . ibuprofen (ADVIL,MOTRIN) 200 MG tablet Take 400 mg by mouth every 6 (six) hours as needed for moderate pain.    [name] insulin glargine (LANTUS) 100 UNIT/ML injection Inject 18-20 Units into the skin at bedtime. 18 to 20 units    . insulin lispro protamine-insulin lispro (HUMALOG 50/50) (50-50) 100 UNIT/ML SUSP Inject 4 Units into the skin 3 (three) times daily. With each meal    . loratadine (CLARITIN) 10 MG tablet TAKE ONE TABLET BY MOUTH DAILY FOR HIVES.  6  . LORazepam (ATIVAN) 0.5 MG tablet Take 1 tablet (0.5 mg total) by mouth every 8 (eight) hours. For nausea or anxiety 45 tablet 0  . mesalamine (ROWASA) 4 g enema Place 60 mLs (4 g total) rectally at bedtime. (Patient taking differently: Place 4 g rectally as needed. ) 30 Bottle 1  . mometasone (NASONEX) 50 MCG/ACT nasal spray Place 2 sprays into the nose daily as needed.     . mupirocin ointment (BACTROBAN) 2 % Place 1 application into the nose 2 (two) times daily. (Patient taking differently: Place 1 application into the nose 2 (two) times daily as needed (irritation). ) 22 g 0  . omeprazole (PRILOSEC) 20 MG capsule Take 20 mg by mouth 2 (two) times daily before a meal.     . ondansetron (ZOFRAN ODT) 8 MG disintegrating tablet Take 1 tablet (8 mg total) by mouth every 8 (eight) hours as needed for nausea or vomiting. 30 tablet 2  . polyethylene glycol powder (GLYCOLAX/MIRALAX) powder Take 8.5 g by mouth daily. (Patient taking differently: Take 8.5 g by mouth daily as needed for mild constipation. ) 255 g 5  . promethazine (PHENERGAN) 25 MG tablet Take 1 tablet (25 mg total) by mouth every 6 (six) hours as needed for nausea or vomiting. 30 tablet 2   Current Facility-Administered Medications on File Prior to Visit  Medication Dose Route Frequency Provider Last Rate Last Dose  . 0.9 %  sodium chloride infusion   Intravenous Continuous Kefalas, Manon Hilding, PA-C            Objective:   Physical Exam  Blood pressure (!) 160/84,  pulse 72, temperature 98.2 F (36.8 C), height 5' 1"  (1.549 m), weight 178 lb 4.8 oz (80.9 kg), last menstrual period 09/04/2015. Alert and oriented. Skin warm and dry. Oral mucosa is moist.   . Sclera anicteric, conjunctivae is pink. Thyroid not enlarged. No cervical lymphadenopathy. Lungs clear. Heart regular rate and rhythm.  Abdomen is soft. Bowel sounds are positive. No hepatomegaly. No abdominal masses felt. No tenderness.  No edema to lower extremities.          Assessment &  Plan:  UC. Will repeat CBC in 4 weeks. OV in 6 months. Labs from [name].

## 2017-03-20 NOTE — Patient Instructions (Signed)
OV in 6 months.

## 2017-03-22 ENCOUNTER — Other Ambulatory Visit (INDEPENDENT_AMBULATORY_CARE_PROVIDER_SITE_OTHER): Payer: Self-pay | Admitting: *Deleted

## 2017-03-22 DIAGNOSIS — K5190 Ulcerative colitis, unspecified, without complications: Secondary | ICD-10-CM

## 2017-04-26 ENCOUNTER — Ambulatory Visit (HOSPITAL_COMMUNITY): Payer: Medicaid Other

## 2017-05-09 ENCOUNTER — Encounter (HOSPITAL_COMMUNITY): Payer: Self-pay

## 2017-05-09 ENCOUNTER — Ambulatory Visit (HOSPITAL_COMMUNITY)
Admission: RE | Admit: 2017-05-09 | Discharge: 2017-05-09 | Disposition: A | Discharge status: Home / Self Care | Payer: Medicaid Other | Source: Ambulatory Visit | Attending: Oncology | Admitting: Oncology

## 2017-05-09 DIAGNOSIS — Z171 Estrogen receptor negative status [ER-]: Secondary | ICD-10-CM | POA: Insufficient documentation

## 2017-05-09 DIAGNOSIS — C50411 Malignant neoplasm of upper-outer quadrant of right female breast: Secondary | ICD-10-CM | POA: Insufficient documentation

## 2017-05-11 ENCOUNTER — Inpatient Hospital Stay (HOSPITAL_COMMUNITY): Payer: Medicaid Other | Attending: Adult Health

## 2017-05-11 ENCOUNTER — Other Ambulatory Visit (HOSPITAL_COMMUNITY): Payer: Self-pay | Admitting: *Deleted

## 2017-05-11 DIAGNOSIS — Z171 Estrogen receptor negative status [ER-]: Secondary | ICD-10-CM

## 2017-05-11 DIAGNOSIS — D500 Iron deficiency anemia secondary to blood loss (chronic): Secondary | ICD-10-CM

## 2017-05-11 DIAGNOSIS — K219 Gastro-esophageal reflux disease without esophagitis: Secondary | ICD-10-CM | POA: Insufficient documentation

## 2017-05-11 DIAGNOSIS — E108 Type 1 diabetes mellitus with unspecified complications: Secondary | ICD-10-CM | POA: Diagnosis not present

## 2017-05-11 DIAGNOSIS — Z853 Personal history of malignant neoplasm of breast: Secondary | ICD-10-CM | POA: Insufficient documentation

## 2017-05-11 DIAGNOSIS — Z79899 Other long term (current) drug therapy: Secondary | ICD-10-CM | POA: Diagnosis not present

## 2017-05-11 DIAGNOSIS — K5190 Ulcerative colitis, unspecified, without complications: Secondary | ICD-10-CM | POA: Insufficient documentation

## 2017-05-11 DIAGNOSIS — C50411 Malignant neoplasm of upper-outer quadrant of right female breast: Secondary | ICD-10-CM

## 2017-05-11 DIAGNOSIS — Z9221 Personal history of antineoplastic chemotherapy: Secondary | ICD-10-CM | POA: Diagnosis not present

## 2017-05-11 DIAGNOSIS — E785 Hyperlipidemia, unspecified: Secondary | ICD-10-CM | POA: Insufficient documentation

## 2017-05-11 DIAGNOSIS — N912 Amenorrhea, unspecified: Secondary | ICD-10-CM | POA: Insufficient documentation

## 2017-05-11 DIAGNOSIS — N926 Irregular menstruation, unspecified: Secondary | ICD-10-CM | POA: Diagnosis not present

## 2017-05-11 DIAGNOSIS — Z794 Long term (current) use of insulin: Secondary | ICD-10-CM | POA: Diagnosis not present

## 2017-05-11 LAB — COMPREHENSIVE METABOLIC PANEL
ALBUMIN: 3.4 g/dL — AB (ref 3.5–5.0)
ALT: 14 U/L (ref 14–54)
AST: 21 U/L (ref 15–41)
Alkaline Phosphatase: 64 U/L (ref 38–126)
Anion gap: 10 (ref 5–15)
BUN: 9 mg/dL (ref 6–20)
CHLORIDE: 101 mmol/L (ref 101–111)
CO2: 26 mmol/L (ref 22–32)
CREATININE: 0.6 mg/dL (ref 0.44–1.00)
Calcium: 8.8 mg/dL — ABNORMAL LOW (ref 8.9–10.3)
GFR calc Af Amer: >60 mL/min (ref >60)
GFR calc non Af Amer: >60 mL/min (ref >60)
Glucose, Bld: 312 mg/dL — ABNORMAL HIGH (ref 65–99)
Potassium: 4.1 mmol/L (ref 3.5–5.1)
SODIUM: 137 mmol/L (ref 135–145)
Total Bilirubin: 0.5 mg/dL (ref 0.3–1.2)
Total Protein: 6.3 g/dL — ABNORMAL LOW (ref 6.5–8.1)

## 2017-05-11 LAB — CBC WITH DIFFERENTIAL/PLATELET
BASOS ABS: 0 10*3/uL (ref 0.0–0.1)
BASOS PCT: 0 %
EOS ABS: 0 10*3/uL (ref 0.0–0.7)
EOS PCT: 0 %
HCT: 33.1 % — ABNORMAL LOW (ref 36.0–46.0)
Hemoglobin: 10.3 g/dL — ABNORMAL LOW (ref 12.0–15.0)
Lymphocytes Relative: 18 %
Lymphs Abs: 1 10*3/uL (ref 0.7–4.0)
MCH: 26.8 pg (ref 26.0–34.0)
MCHC: 31.1 g/dL (ref 30.0–36.0)
MCV: 86.2 fL (ref 78.0–100.0)
Monocytes Absolute: 0.5 10*3/uL (ref 0.1–1.0)
Monocytes Relative: 10 %
Neutro Abs: 4 10*3/uL (ref 1.7–7.7)
Neutrophils Relative %: 72 %
Platelets: 207 10*3/uL (ref 150–400)
RBC: 3.84 MIL/uL — AB (ref 3.87–5.11)
RDW: 14.2 % (ref 11.5–15.5)
WBC: 5.5 10*3/uL (ref 4.0–10.5)

## 2017-05-11 LAB — LACTATE DEHYDROGENASE: LDH: 158 U/L (ref 98–192)

## 2017-05-11 LAB — IRON AND TIBC
Iron: 26 ug/dL — ABNORMAL LOW (ref 28–170)
SATURATION RATIOS: 7 % — AB (ref 10.4–31.8)
TIBC: 384 ug/dL (ref 250–450)
UIBC: 358 ug/dL

## 2017-05-11 LAB — FERRITIN: Ferritin: 11 ng/mL (ref 11–307)

## 2017-05-12 ENCOUNTER — Ambulatory Visit (HOSPITAL_COMMUNITY): Payer: Medicaid Other | Admitting: Adult Health

## 2017-05-12 ENCOUNTER — Encounter (HOSPITAL_COMMUNITY): Payer: Self-pay | Admitting: Internal Medicine

## 2017-05-12 ENCOUNTER — Other Ambulatory Visit: Payer: Self-pay

## 2017-05-12 ENCOUNTER — Inpatient Hospital Stay (HOSPITAL_BASED_OUTPATIENT_CLINIC_OR_DEPARTMENT_OTHER): Payer: Medicaid Other | Admitting: Internal Medicine

## 2017-05-12 VITALS — BP 148/90 | HR 83 | Temp 98.1°F | Resp 18 | Ht 62.0 in | Wt 176.5 lb

## 2017-05-12 DIAGNOSIS — Z794 Long term (current) use of insulin: Secondary | ICD-10-CM

## 2017-05-12 DIAGNOSIS — K5190 Ulcerative colitis, unspecified, without complications: Secondary | ICD-10-CM | POA: Diagnosis not present

## 2017-05-12 DIAGNOSIS — N926 Irregular menstruation, unspecified: Secondary | ICD-10-CM | POA: Diagnosis not present

## 2017-05-12 DIAGNOSIS — N912 Amenorrhea, unspecified: Secondary | ICD-10-CM | POA: Diagnosis not present

## 2017-05-12 DIAGNOSIS — E108 Type 1 diabetes mellitus with unspecified complications: Secondary | ICD-10-CM

## 2017-05-12 DIAGNOSIS — K219 Gastro-esophageal reflux disease without esophagitis: Secondary | ICD-10-CM | POA: Diagnosis not present

## 2017-05-12 DIAGNOSIS — E785 Hyperlipidemia, unspecified: Secondary | ICD-10-CM

## 2017-05-12 DIAGNOSIS — Z79899 Other long term (current) drug therapy: Secondary | ICD-10-CM | POA: Diagnosis not present

## 2017-05-12 DIAGNOSIS — D500 Iron deficiency anemia secondary to blood loss (chronic): Secondary | ICD-10-CM | POA: Diagnosis not present

## 2017-05-12 DIAGNOSIS — Z171 Estrogen receptor negative status [ER-]: Secondary | ICD-10-CM

## 2017-05-12 DIAGNOSIS — Z9221 Personal history of antineoplastic chemotherapy: Secondary | ICD-10-CM | POA: Diagnosis not present

## 2017-05-12 DIAGNOSIS — Z853 Personal history of malignant neoplasm of breast: Secondary | ICD-10-CM | POA: Diagnosis not present

## 2017-05-12 NOTE — Patient Instructions (Signed)
[place] at [facility] Discharge Instructions  Today you saw Dr. Walden Field. We will schedule you for Iron Infusion.  We will recheck labs in 6 weeks. You will return clinic in 6 months  And repeat labs.    Thank you for choosing [place] at [facility] to provide your oncology and hematology care.  To afford each patient quality time with our provider, please arrive at least 15 minutes before your scheduled appointment time.   If you have a lab appointment with the [place] please come in thru the  Main Entrance and check in at the main information desk  You need to re-schedule your appointment should you arrive 10 or more minutes late.  We strive to give you quality time with our providers, and arriving late affects you and other patients whose appointments are after yours.  Also, if you no show three or more times for appointments you may be dismissed from the clinic at the providers discretion.     Again, thank you for choosing [facility].  Our hope is that these requests will decrease the amount of time that you wait before being seen by our physicians.       _____________________________________________________________  Should you have questions after your visit to [facility], please contact our office at [phone] between the hours of 8:30 a.m. and 4:30 p.m.  Voicemails left after 4:30 p.m. will not be returned until the following business day.  For prescription refill requests, have your pharmacy contact our office.       Resources For Cancer Patients and their Caregivers ? American Cancer Society: Can assist with transportation, wigs, general needs, runs Look Good Feel Better.        [phone] ? Cancer Care: Provides financial assistance, online support groups, medication/co-pay assistance.  1-800-813-HOPE 812-164-2535) ? [place] Assists [place] cancer patients and  their families through emotional , educational and financial support.  3056788367 ? [place] DSS Where to apply for food stamps, Medicaid and utility assistance. [phone] 729-6380 ? [employer]: Transportation to medical appointments. 9891085863 ? Social Security Administration: May apply for disability if have a Stage IV cancer. 218-024-9178 912-707-7348 ? [employer], Disability and Transit Services: Assists with nutrition, care and transit needs. [place] Support Programs:   > Cancer Support Group  2nd Tuesday of the month 1pm-2pm, Journey Room   > Creative Journey  3rd Tuesday of the month 1130am-1pm, Journey Room

## 2017-05-12 NOTE — Progress Notes (Signed)
Diagnosis Iron deficiency anemia due to chronic blood loss - Plan: CBC with Differential/Platelet, Comprehensive metabolic panel, Lactate dehydrogenase, Ferritin, CBC with Differential/Platelet, Comprehensive metabolic panel, Lactate dehydrogenase, Ferritin, Follicle stimulating hormone, Estradiol, Luteinizing hormone  Amenorrhea - Plan: Follicle stimulating hormone, Estradiol, Luteinizing hormone  Staging Cancer Staging Malignant neoplasm of upper-outer quadrant of right female breast [facility]) Staging form: Breast, AJCC 7th Edition - Clinical stage from 05/13/2015: Stage IA (T1c, N0, M0) - Signed by Baird Cancer, PA-C on 06/25/2015   Assessment and Plan: 1.  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  She is S/P right  lumpectomy and sentinel LN biopsy.   Pathology from surgery done at [facility] on  05/13/2015 showed 2 cm tumor grade 3, no DCIS, Margins negative, 3 SLN negative.  ER 1%, PR 1% Her 2 2+ with ratio 1.07.   She had bilateral diagnostic mammogram done 05/09/2017 that was negative with follow-up imaging recommended in 1 year.  No palpable abnormalities noted on exam. She will RTC in 6 months for follow-up and labs.  2.  Iron deficiency Anemia.  Labs done 05/11/2017 show HB 10.3 Ferritin 11.  She has a history of UC.  She was last treated with Injectafer on 10/31/2016.  She was given the option of repeat Injectafer due to [place].  She will RTC for treatment with Injectafer 750 mg IV on D1 and D8.  She will have repeat labs 6 to 8 weeks after therapy.    3.  Ulcerative Colitis followed by Dr. Laural Golden.  Continue GI follow-up as recommended.    4.  DM.  Follow-up with PCP for management.    5.  Irregular cycles.  Pt will have hormone levels checked at next visit with [place], E2, LH.  Pt may be perimenopausal due to prior chemotherapy as detailed below.    Current status:  Pt is seen today for follow-up.  She is here to go over recent mammogram.  She reports irregular cycles.      PATHOLOGY:      Orders Placed This Encounter  Procedures  . MM DIAG BREAST TOMO BILATERAL    Standing Status:   Future    Standing Expiration Date:   10/26/2017    Order Specific Question:   Reason for Exam (SYMPTOM  OR DIAGNOSIS REQUIRED)    Answer:   annual mammogram    Order Specific Question:   Is the patient pregnant?    Answer:   No    Order Specific Question:   Preferred imaging location?    Answer:   [facility]  . CBC with Differential    Standing Status:   Future    Standing Expiration Date:   10/26/2017  . Comprehensive metabolic panel      Malignant neoplasm of upper-outer quadrant of right female breast ([place])   04/29/2015 Initial Biopsy    upper outer quadrant mass R breast 2.9 cm by ultrasound, high grade invasive ductal carcinoma ER< 1%, PR 1%, HER -2 not amplified, Ki-67 66%      05/13/2015 Cancer Staging    pT1cN0      05/13/2015 Surgery    lumpectomy and sentinel node biopsies X 3, 2 cm tumor, 3 negative nodes       05/21/2015 Imaging    CT [facility] with postsurgical changes related to R breast lympectomy and R axillary LN dissection, no findings suspicious for metastatic disease      06/12/2015 Procedure    Port placement  by Dr. Anthony Sar      06/17/2015 Imaging    MUGA- Normal left ventricular ejection fraction equal to 62.9%.      06/25/2015 - 08/13/2015 Chemotherapy    AC x 4      08/27/2015 - 09/03/2015 Chemotherapy    Weekly Taxol x 2 cycles       09/11/2015 - 09/15/2015 Hospital Admission    Neutropenic fever, ARF secondary to antibiotics      10/01/2015 - 11/12/2015 Chemotherapy    Taxotere 75 mg/m2 Q3 weeks x 3 cycles        10/01/2015 Treatment Plan Change    Taxol changed to Taxotere given hospitalization.        - 01/14/2016 Radiation Therapy    Radiation in [facility]      05/11/2016 Mammogram    IMPRESSION: No evidence of malignancy within either breast. Expected postsurgical changes within the right  breast.         Problem List Patient Active Problem List   Diagnosis Date Noted  . Neutropenic fever ([place]) [D70.9, R50.81] 09/11/2015  . Chemotherapy induced neutropenia ([place]) [D70.1, T45.1X5A] 09/10/2015  . Nausea without vomiting [R11.0] 07/09/2015  . Genetic counseling and testing [[id number]] 06/29/2015  . Iron deficiency anemia due to chronic blood loss [D50.0] 06/12/2015  . Malignant neoplasm of upper-outer quadrant of right female breast ([place]) [C50.411] 06/11/2015  . Bilateral cataracts [H26.9] 05/12/2015  . Type 2 diabetes mellitus without complication ([place]) [J82.5] 05/12/2015  . Gastroparesis [K31.84] 08/26/2013  . THYROID NODULE, RIGHT [E04.1] 04/17/2008  . Ulcerative colitis ([place]) [K51.90] 11/01/2007  . Type 1 diabetes mellitus ([place]) [E10.9] 10/11/2007  . HYPERLIPIDEMIA [E78.5] 10/11/2007  . ANEMIA-NOS [D64.9] 10/11/2007  . Depression [F32.9] 10/11/2007  . GERD [K21.9] 10/11/2007  . PEPTIC ULCER DISEASE [K27.9] 10/11/2007  . HEADACHE [R51] 10/11/2007  . COLONIC POLYPS, HX OF [Z86.010] 10/11/2007  . UTI'S, HX OF [Z87.448] 10/11/2007  . CHICKENPOX, HX OF [Z91.89] 10/11/2007    Past Medical History Past Medical History:  Diagnosis Date  . Breast cancer ([place])   . Breast cancer of upper-outer quadrant of right female breast ([place]) 06/11/2015   Triple negative, 2 cm   . Chemotherapy induced neutropenia ([place]) 09/10/2015  . Diabetes mellitus ([place])    Type 1 over 15 yrs  . Environmental allergies   . GERD (gastroesophageal reflux disease)   . Iron deficiency anemia due to chronic blood loss 06/12/2015  . Personal history of chemotherapy   . Personal history of radiation therapy   . 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) [facility])     Past Surgical History Past Surgical History:  Procedure Laterality Date  . BREAST LUMPECTOMY Right 2017  . CESAREAN SECTION    . COLONOSCOPY N/A 10/09/2013   Procedure: COLONOSCOPY;  Surgeon: Rogene Houston, MD;  Location[facility];   Service: Endoscopy;  Laterality: N/A;  100  . DILATION AND CURETTAGE OF UTERUS     x 2 for miscarriages  . ESOPHAGOGASTRODUODENOSCOPY    . FLEXIBLE SIGMOIDOSCOPY    . FLEXIBLE SIGMOIDOSCOPY  07/27/2011   Procedure: FLEXIBLE SIGMOIDOSCOPY;  Surgeon: Rogene Houston, MD;  Location: [facility];  Service: Endoscopy;  Laterality: N/A;  100    Family History Family History  Problem Relation Age of Onset  . Non-Hodgkin's lymphoma Mother   . Hypertension Mother   . Diabetes Mellitus II Mother   . Hypertension Father   . Diabetes Mellitus II Father   . Colon cancer Cousin   .  Breast cancer Maternal Aunt      Social History  reports that she has never smoked. She has never used smokeless tobacco. She reports that she does not drink alcohol or use drugs.  Medications  Current Outpatient Medications:  .  acetaminophen (TYLENOL) 500 MG tablet, Take 1,000 mg by mouth every 6 (six) hours as needed. For pain, Disp: , Rfl:  .  acyclovir (ZOVIRAX) 200 MG capsule, Take 200 mg by mouth 2 (two) times daily. Reported on 07/23/2015, Disp: , Rfl:  .  albuterol (PROVENTIL HFA;VENTOLIN HFA) 108 (90 Base) MCG/ACT inhaler, Inhale into the lungs every 6 (six) hours as needed for wheezing or shortness of breath., Disp: , Rfl:  .  APRISO 0.375 g 24 hr capsule, TAKE FOUR (4) CAPSULES BY MOUTH EVERY MORNING, Disp: 120 capsule, Rfl: 5 .  atorvastatin (LIPITOR) 20 MG tablet, Take 20 mg by mouth daily., Disp: , Rfl:  .  buPROPion (WELLBUTRIN) 100 MG tablet, Take 300 mg by mouth 2 (two) times daily., Disp: , Rfl:  .  clotrimazole-betamethasone (LOTRISONE) cream, Apply 1 application topically 2 (two) times daily. Apply to groin (Patient taking differently: Apply 1 application topically as needed. Apply to groin), Disp: 30 g, Rfl: 0 .  EPINEPHrine (EPIPEN) 0.3 mg/0.3 mL SOAJ injection, Inject into the muscle as needed., Disp: , Rfl:  .  FLUoxetine (PROZAC) 40 MG capsule, Take 1 capsule (40 mg total) by mouth daily.  (Patient taking differently: Take 80 mg by mouth daily. ), Disp: 30 capsule, Rfl: 3 .  gabapentin (NEURONTIN) 300 MG capsule, Take 600 mg by mouth at bedtime. , Disp: , Rfl: 6 .  HYDROcodone-acetaminophen (NORCO/VICODIN) 5-325 MG tablet, TAKE ONE TABLET BY MOUTH EVERY 4 HOURS AS NEEDED FOR MODERATE PAIN, Disp: 30 tablet, Rfl: 0 .  ibuprofen (ADVIL,MOTRIN) 200 MG tablet, Take 400 mg by mouth every 6 (six) hours as needed for moderate pain., Disp: , Rfl:  .  insulin glargine (LANTUS) 100 UNIT/ML injection, Inject 18-20 Units into the skin at bedtime. 18 to 20 units, Disp: , Rfl:  .  insulin lispro protamine-insulin lispro (HUMALOG 50/50) (50-50) 100 UNIT/ML SUSP, Inject 4 Units into the skin 3 (three) times daily. With each meal, Disp: , Rfl:  .  loratadine (CLARITIN) 10 MG tablet, TAKE ONE TABLET BY MOUTH DAILY FOR HIVES., Disp: , Rfl: 6 .  LORazepam (ATIVAN) 0.5 MG tablet, Take 1 tablet (0.5 mg total) by mouth every 8 (eight) hours. For nausea or anxiety, Disp: 45 tablet, Rfl: 0 .  mesalamine (ROWASA) 4 g enema, Place 60 mLs (4 g total) rectally at bedtime. (Patient taking differently: Place 4 g rectally as needed. ), Disp: 30 Bottle, Rfl: 1 .  mometasone (NASONEX) 50 MCG/ACT nasal spray, Place 2 sprays into the nose daily as needed. , Disp: , Rfl:  .  mupirocin ointment (BACTROBAN) 2 %, Place 1 application into the nose 2 (two) times daily. (Patient taking differently: Place 1 application into the nose 2 (two) times daily as needed (irritation). ), Disp: 22 g, Rfl: 0 .  omeprazole (PRILOSEC) 20 MG capsule, Take 20 mg by mouth 2 (two) times daily before a meal. , Disp: , Rfl:  .  ondansetron (ZOFRAN ODT) 8 MG disintegrating tablet, Take 1 tablet (8 mg total) by mouth every 8 (eight) hours as needed for nausea or vomiting., Disp: 30 tablet, Rfl: 2 .  polyethylene glycol powder (GLYCOLAX/MIRALAX) powder, Take 8.5 g by mouth daily. (Patient taking differently: Take 8.5 g by mouth  daily as needed for mild  constipation. ), Disp: 255 g, Rfl: 5 .  promethazine (PHENERGAN) 25 MG tablet, Take 1 tablet (25 mg total) by mouth every 6 (six) hours as needed for nausea or vomiting., Disp: 30 tablet, Rfl: 2 No current facility-administered medications for this visit.   Facility-Administered Medications Ordered in Other Visits:  .  0.9 %  sodium chloride infusion, , Intravenous, Continuous, Kefalas, Manon Hilding, PA-C  Allergies Ace inhibitors; Adhesive [tape]; Pravastatin sodium; Statins; Peanuts [peanut oil]; and Shellfish allergy  Review of Systems Review of Systems - Oncology ROS as per HPI otherwise 12 point ROS is negative.   Physical Exam  Vitals Wt Readings from Last 3 Encounters:  05/12/17 176 lb 8 oz (80.1 kg)  03/20/17 178 lb 4.8 oz (80.9 kg)  11/16/16 173 lb 9.6 oz (78.7 kg)   Temp Readings from Last 3 Encounters:  05/12/17 98.1 F (36.7 C) (Oral)  03/20/17 98.2 F (36.8 C)  11/23/16 98.2 F (36.8 C) (Oral)   BP Readings from Last 3 Encounters:  05/12/17 (!) 148/90  03/20/17 (!) 160/84  11/23/16 130/71   Pulse Readings from Last 3 Encounters:  05/12/17 83  03/20/17 72  11/23/16 68    Constitutional: Well-developed, well-nourished, and in no distress.   HENT: Head: Normocephalic and atraumatic.  Mouth/Throat: No oropharyngeal exudate. Mucosa moist. Eyes: Pupils are equal, round, and reactive to light. Conjunctivae are normal. No scleral icterus.  Neck: Normal range of motion. Neck supple. No JVD present.  Cardiovascular: Normal rate, regular rhythm and normal heart sounds.  Exam reveals no gallop and no friction rub.   No murmur heard. Pulmonary/Chest: Effort normal and breath sounds normal. No respiratory distress. No wheezes.No rales.  Abdominal: Soft. Bowel sounds are normal. No distension. There is no tenderness. There is no guarding.  Musculoskeletal: No edema or tenderness.  Lymphadenopathy: No cervical, axillary or supraclavicular adenopathy.  Neurological: Alert  and oriented to person, place, and time. No cranial nerve deficit.  Skin: Skin is warm and dry. No rash noted. No erythema. No pallor.  Psychiatric: Affect and judgment normal.  Bilateral breast exam.  Chaperone present.  Right lumpectomy and RT changes noted.  No palpable masses noted bilaterally.      Labs Appointment on 05/11/2017  Component Date Value Ref Range Status  . WBC 05/11/2017 5.5  4.0 - 10.5 K/uL Final  . RBC 05/11/2017 3.84* 3.87 - 5.11 MIL/uL Final  . Hemoglobin 05/11/2017 10.3* 12.0 - 15.0 g/dL Final  . HCT 05/11/2017 33.1* 36.0 - 46.0 % Final  . MCV 05/11/2017 86.2  78.0 - 100.0 fL Final  . MCH 05/11/2017 26.8  26.0 - 34.0 pg Final  . MCHC 05/11/2017 31.1  30.0 - 36.0 g/dL Final  . RDW 05/11/2017 14.2  11.5 - 15.5 % Final  . Platelets 05/11/2017 207  150 - 400 K/uL Final  . Neutrophils Relative % 05/11/2017 72  % Final  . Neutro Abs 05/11/2017 4.0  1.7 - 7.7 K/uL Final  . Lymphocytes Relative 05/11/2017 18  % Final  . Lymphs Abs 05/11/2017 1.0  0.7 - 4.0 K/uL Final  . Monocytes Relative 05/11/2017 10  % Final  . Monocytes Absolute 05/11/2017 0.5  0.1 - 1.0 K/uL Final  . Eosinophils Relative 05/11/2017 0  % Final  . Eosinophils Absolute 05/11/2017 0.0  0.0 - 0.7 K/uL Final  . Basophils Relative 05/11/2017 0  % Final  . Basophils Absolute 05/11/2017 0.0  0.0 - 0.1 K/uL Final  Performed at [facility], [address], [place], [place] [address]  . Sodium 05/11/2017 137  135 - 145 mmol/L Final  . Potassium 05/11/2017 4.1  3.5 - 5.1 mmol/L Final  . Chloride 05/11/2017 101  101 - 111 mmol/L Final  . CO2 05/11/2017 26  22 - 32 mmol/L Final  . Glucose, Bld 05/11/2017 312* 65 - 99 mg/dL Final  . BUN 05/11/2017 9  6 - 20 mg/dL Final  . Creatinine, Ser 05/11/2017 0.60  0.44 - 1.00 mg/dL Final  . Calcium 05/11/2017 8.8* 8.9 - 10.3 mg/dL Final  . Total Protein 05/11/2017 6.3* 6.5 - 8.1 g/dL Final  . Albumin 05/11/2017 3.4* 3.5 - 5.0 g/dL Final  . AST 05/11/2017 21  15 - 41  U/L Final  . ALT 05/11/2017 14  14 - 54 U/L Final  . Alkaline Phosphatase 05/11/2017 64  38 - 126 U/L Final  . Total Bilirubin 05/11/2017 0.5  0.3 - 1.2 mg/dL Final  . GFR calc non Af Amer 05/11/2017 >60  >60 mL/min Final  . GFR calc Af Amer 05/11/2017 >60  >60 mL/min Final   Comment: (NOTE) The eGFR has been calculated using the CKD EPI equation. This calculation has not been validated in all clinical situations. eGFR's persistently <60 mL/min signify possible Chronic Kidney Disease.   Georgiann Hahn gap 05/11/2017 10  5 - 15 Final   Performed at [facility], [address], [place], [place] [address]  . LDH 05/11/2017 158  98 - 192 U/L Final   Performed at [facility], [address], [place], [place] [address]  . Ferritin 05/11/2017 11  11 - 307 ng/mL Final   Performed at [facility], [place] [address], [place], [place] [address]  . Iron 05/11/2017 26* 28 - 170 ug/dL Final  . TIBC 05/11/2017 384  250 - 450 ug/dL Final  . Saturation Ratios 05/11/2017 7* 10.4 - 31.8 % Final  . UIBC 05/11/2017 358  ug/dL Final   Performed at [facility], [place] [address] [place], [place] [address]     Pathology Orders Placed This Encounter  Procedures  . CBC with Differential/Platelet    Standing Status:   Future    Standing Expiration Date:   05/13/2018  . Comprehensive metabolic panel    Standing Status:   Future    Standing Expiration Date:   05/13/2018  . Lactate dehydrogenase    Standing Status:   Future    Standing Expiration Date:   05/13/2018  . Ferritin    Standing Status:   Future    Standing Expiration Date:   05/13/2018  . CBC with Differential/Platelet    Standing Status:   Future    Standing Expiration Date:   05/13/2018  . Comprehensive metabolic panel    Standing Status:   Future    Standing Expiration Date:   05/13/2018  . Lactate dehydrogenase    Standing Status:   Future    Standing Expiration Date:   05/13/2018  . Ferritin    Standing Status:   Future    Standing  Expiration Date:   05/13/2018  . Follicle stimulating hormone    Standing Status:   Future    Standing Expiration Date:   05/13/2018  . Estradiol    Standing Status:   Future    Standing Expiration Date:   05/13/2018  . Luteinizing hormone    Standing Status:   Future    Standing Expiration Date:   05/13/2018  Zoila Shutter MD

## 2017-05-19 ENCOUNTER — Encounter (HOSPITAL_COMMUNITY): Payer: Self-pay

## 2017-05-19 ENCOUNTER — Inpatient Hospital Stay (HOSPITAL_COMMUNITY): Payer: Medicaid Other

## 2017-05-19 ENCOUNTER — Other Ambulatory Visit: Payer: Self-pay

## 2017-05-19 ENCOUNTER — Other Ambulatory Visit (HOSPITAL_COMMUNITY): Payer: Self-pay | Admitting: Pharmacist

## 2017-05-19 VITALS — BP 135/78 | HR 80 | Temp 98.9°F | Resp 16

## 2017-05-19 DIAGNOSIS — D500 Iron deficiency anemia secondary to blood loss (chronic): Secondary | ICD-10-CM

## 2017-05-19 MED ORDER — FERRIC CARBOXYMALTOSE 750 MG/15ML IV SOLN
750.0000 mg | Freq: Once | INTRAVENOUS | Status: AC
  Administered 2017-05-19: 750 mg via INTRAVENOUS
  Filled 2017-05-19: qty 15

## 2017-05-19 MED ORDER — SODIUM CHLORIDE 0.9 % IV SOLN
INTRAVENOUS | Status: DC
  Administered 2017-05-19: 12:00:00 via INTRAVENOUS

## 2017-05-19 NOTE — Progress Notes (Signed)
Injectafer given today per orders. Vitals stable and discharged home from clinic ambulatory. Follow up as scheduled.

## 2017-05-19 NOTE — Patient Instructions (Signed)
[place] at [facility] Discharge Instructions  injectafer given today. Follow up as scheduled.   Thank you for choosing [place] at [facility] to provide your oncology and hematology care.  To afford each patient quality time with our provider, please arrive at least 15 minutes before your scheduled appointment time.   If you have a lab appointment with the [facility] please come in thru the  Main Entrance and check in at the main information desk  You need to re-schedule your appointment should you arrive 10 or more minutes late.  We strive to give you quality time with our providers, and arriving late affects you and other patients whose appointments are after yours.  Also, if you no show three or more times for appointments you may be dismissed from the clinic at the providers discretion.     Again, thank you for choosing [facility].  Our hope is that these requests will decrease the amount of time that you wait before being seen by our physicians.       _____________________________________________________________  Should you have questions after your visit to [facility], please contact our office at [phone] between the hours of 8:30 a.m. and 4:30 p.m.  Voicemails left after 4:30 p.m. will not be returned until the following business day.  For prescription refill requests, have your pharmacy contact our office.       Resources For Cancer Patients and their Caregivers ? American Cancer Society: Can assist with transportation, wigs, general needs, runs Look Good Feel Better.        320-011-1575 ? Cancer Care: Provides financial assistance, online support groups, medication/co-pay assistance.  1-800-813-HOPE (413)208-4369) ? [place] Assists [place] cancer patients and their families through emotional , educational and financial support.  478-759-7882 ? [place]  DSS Where to apply for food stamps, Medicaid and utility assistance. 757 614 4917 ? [employer]: Transportation to medical appointments. 402-838-5165 ? Social Security Administration: May apply for disability if have a Stage IV cancer. 779 758 6816 815-777-0859 ? [employer], Disability and Transit Services: Assists with nutrition, care and transit needs. [place] Support Programs:   > Cancer Support Group  2nd Tuesday of the month 1pm-2pm, Journey Room   > Creative Journey  3rd Tuesday of the month 1130am-1pm, Journey Room

## 2017-05-26 ENCOUNTER — Inpatient Hospital Stay (HOSPITAL_COMMUNITY): Payer: Medicaid Other

## 2017-05-26 VITALS — BP 140/75 | HR 84 | Temp 98.3°F | Resp 18

## 2017-05-26 DIAGNOSIS — D500 Iron deficiency anemia secondary to blood loss (chronic): Secondary | ICD-10-CM | POA: Diagnosis not present

## 2017-05-26 MED ORDER — SODIUM CHLORIDE 0.9 % IV SOLN
750.0000 mg | Freq: Once | INTRAVENOUS | Status: AC
  Administered 2017-05-26: 750 mg via INTRAVENOUS
  Filled 2017-05-26: qty 15

## 2017-05-26 MED ORDER — SODIUM CHLORIDE 0.9 % IV SOLN
INTRAVENOUS | Status: DC
  Administered 2017-05-26: 14:00:00 via INTRAVENOUS

## 2017-06-28 ENCOUNTER — Inpatient Hospital Stay (HOSPITAL_COMMUNITY): Payer: Medicaid Other | Attending: Hematology

## 2017-06-28 DIAGNOSIS — Z853 Personal history of malignant neoplasm of breast: Secondary | ICD-10-CM | POA: Insufficient documentation

## 2017-06-28 DIAGNOSIS — Z171 Estrogen receptor negative status [ER-]: Secondary | ICD-10-CM | POA: Diagnosis not present

## 2017-06-28 DIAGNOSIS — D500 Iron deficiency anemia secondary to blood loss (chronic): Secondary | ICD-10-CM

## 2017-06-28 DIAGNOSIS — Z9221 Personal history of antineoplastic chemotherapy: Secondary | ICD-10-CM | POA: Insufficient documentation

## 2017-06-28 DIAGNOSIS — N926 Irregular menstruation, unspecified: Secondary | ICD-10-CM | POA: Diagnosis not present

## 2017-06-28 LAB — CBC WITH DIFFERENTIAL/PLATELET
Basophils Absolute: 0 10*3/uL (ref 0.0–0.1)
Basophils Relative: 0 %
EOS ABS: 0 10*3/uL (ref 0.0–0.7)
Eosinophils Relative: 0 %
HCT: 39.2 % (ref 36.0–46.0)
Hemoglobin: 12 g/dL (ref 12.0–15.0)
LYMPHS ABS: 1.2 10*3/uL (ref 0.7–4.0)
Lymphocytes Relative: 34 %
MCH: 27.1 pg (ref 26.0–34.0)
MCHC: 30.6 g/dL (ref 30.0–36.0)
MCV: 88.5 fL (ref 78.0–100.0)
MONO ABS: 0.3 10*3/uL (ref 0.1–1.0)
MONOS PCT: 8 %
NEUTROS PCT: 58 %
Neutro Abs: 2 10*3/uL (ref 1.7–7.7)
Platelets: 131 10*3/uL — ABNORMAL LOW (ref 150–400)
RBC: 4.43 MIL/uL (ref 3.87–5.11)
RDW: 15.9 % — ABNORMAL HIGH (ref 11.5–15.5)
WBC: 3.4 10*3/uL — ABNORMAL LOW (ref 4.0–10.5)

## 2017-06-28 LAB — COMPREHENSIVE METABOLIC PANEL
ALT: 24 U/L (ref 0–44)
ANION GAP: 7 (ref 5–15)
AST: 24 U/L (ref 15–41)
Albumin: 3.8 g/dL (ref 3.5–5.0)
Alkaline Phosphatase: 84 U/L (ref 38–126)
BILIRUBIN TOTAL: 0.5 mg/dL (ref 0.3–1.2)
BUN: 8 mg/dL (ref 6–20)
CALCIUM: 8.6 mg/dL — AB (ref 8.9–10.3)
CO2: 27 mmol/L (ref 22–32)
Chloride: 106 mmol/L (ref 98–111)
Creatinine, Ser: 0.53 mg/dL (ref 0.44–1.00)
GFR calc non Af Amer: >60 mL/min (ref >60)
Glucose, Bld: 146 mg/dL — ABNORMAL HIGH (ref 70–99)
Potassium: 4.2 mmol/L (ref 3.5–5.1)
SODIUM: 140 mmol/L (ref 135–145)
TOTAL PROTEIN: 6.4 g/dL — AB (ref 6.5–8.1)

## 2017-06-28 LAB — FERRITIN: Ferritin: 168 ng/mL (ref 11–307)

## 2017-06-28 LAB — LACTATE DEHYDROGENASE: LDH: 156 U/L (ref 98–192)

## 2017-09-05 ENCOUNTER — Telehealth (INDEPENDENT_AMBULATORY_CARE_PROVIDER_SITE_OTHER): Payer: Self-pay | Admitting: Internal Medicine

## 2017-09-05 NOTE — Telephone Encounter (Signed)
Patient called wanted to know if she was to have lab work done before her next appointment - ph# [phone]

## 2017-09-06 NOTE — Telephone Encounter (Signed)
Brittney [name] - Patient has appointment on September 19 2017. Will she need lab work prior to this visit , if so what labs do you want ordered?

## 2017-09-06 NOTE — Telephone Encounter (Signed)
Cbc,crp

## 2017-09-07 ENCOUNTER — Other Ambulatory Visit (INDEPENDENT_AMBULATORY_CARE_PROVIDER_SITE_OTHER): Payer: Self-pay | Admitting: *Deleted

## 2017-09-07 DIAGNOSIS — K5190 Ulcerative colitis, unspecified, without complications: Secondary | ICD-10-CM

## 2017-09-07 DIAGNOSIS — K3184 Gastroparesis: Secondary | ICD-10-CM

## 2017-09-07 DIAGNOSIS — K279 Peptic ulcer, site unspecified, unspecified as acute or chronic, without hemorrhage or perforation: Secondary | ICD-10-CM

## 2017-09-07 NOTE — Telephone Encounter (Signed)
Lab orders for the CBC, CRP have been completed. Patient was called and made aware.

## 2017-09-10 IMAGING — MG MM BREAST BX W LOC DEV 1ST LESION IMAGE BX SPEC STEREO GUIDE*L*
6 series · 6 of 26 positions shown · non-contrast
Comparison: Previous exams.

ADDENDUM:
Pathology reveals FIBROCYSTIC CHANGES WITH ASSOCIATED
CALCIFICATIONS, FIBROADENOMATOID CHANGES of the upper inner quadrant
of the Left breast. This was found to be concordant by Dr.Algot
Anduyan. Pathology results were discussed with the patient via telephone.
She reported minimal bleeding at the biopsy site and is doing well
otherwise. Post biopsy care and instructions were reviewed and
questions were answered. She was encouraged to contact The [REDACTED] with any additional questions and or
concerns. She was instructed to return for annual screening
mammograms in [HOSPITAL][HOSPITAL].

Pathology results reported by Hemsile Hallac RN on December 11, 2014.
CLINICAL DATA: Left breast calcifications.
EXAM:
LEFT BREAST STEREOTACTIC CORE NEEDLE BIOPSY

[L SPECIMEN]
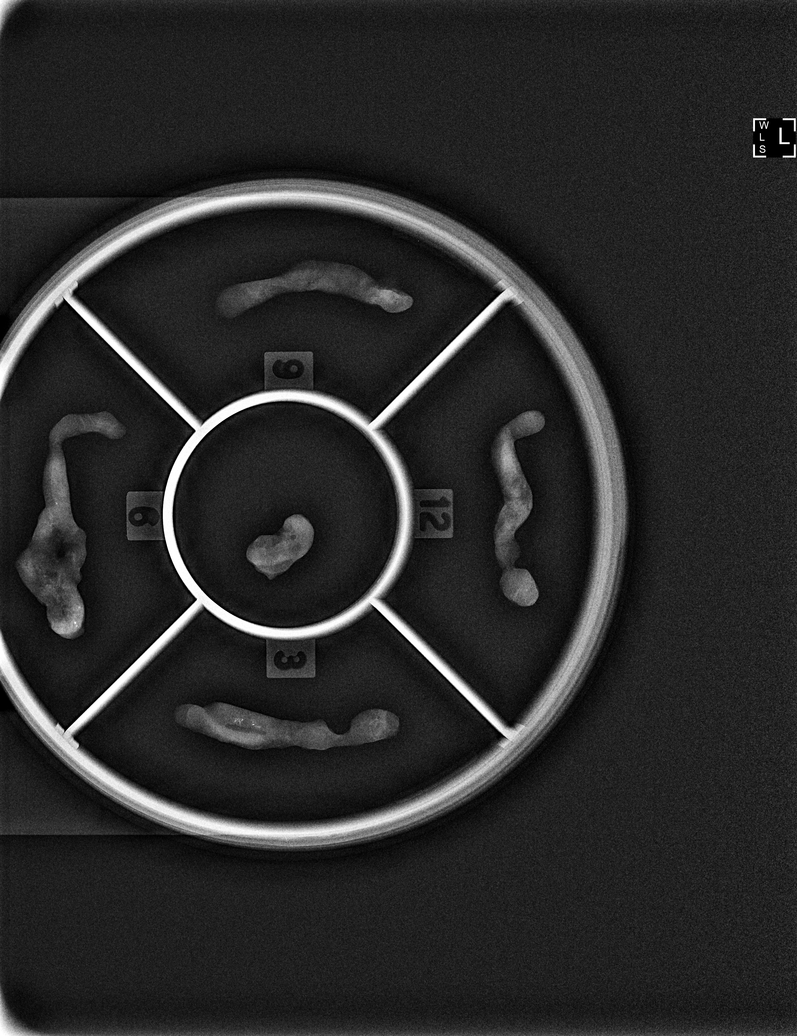

[L CC tomo (1 of 5) · tomo slice 25/50.0]
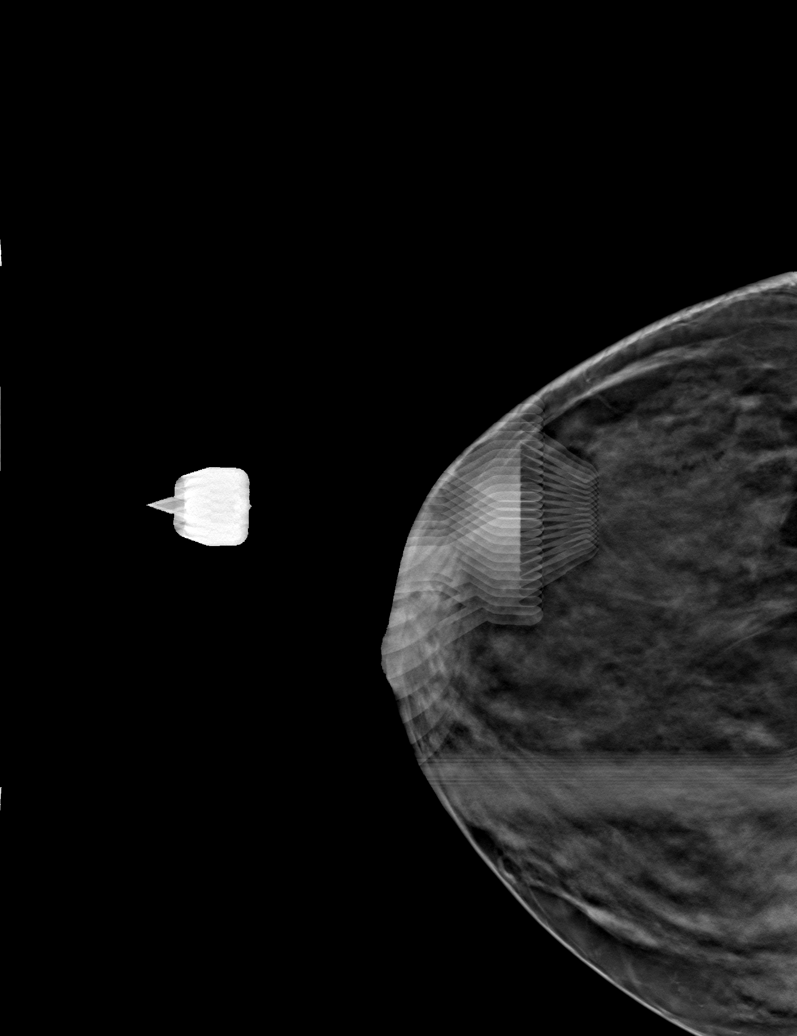

[L CC tomo (2 of 5) · tomo slice 25/50.0]
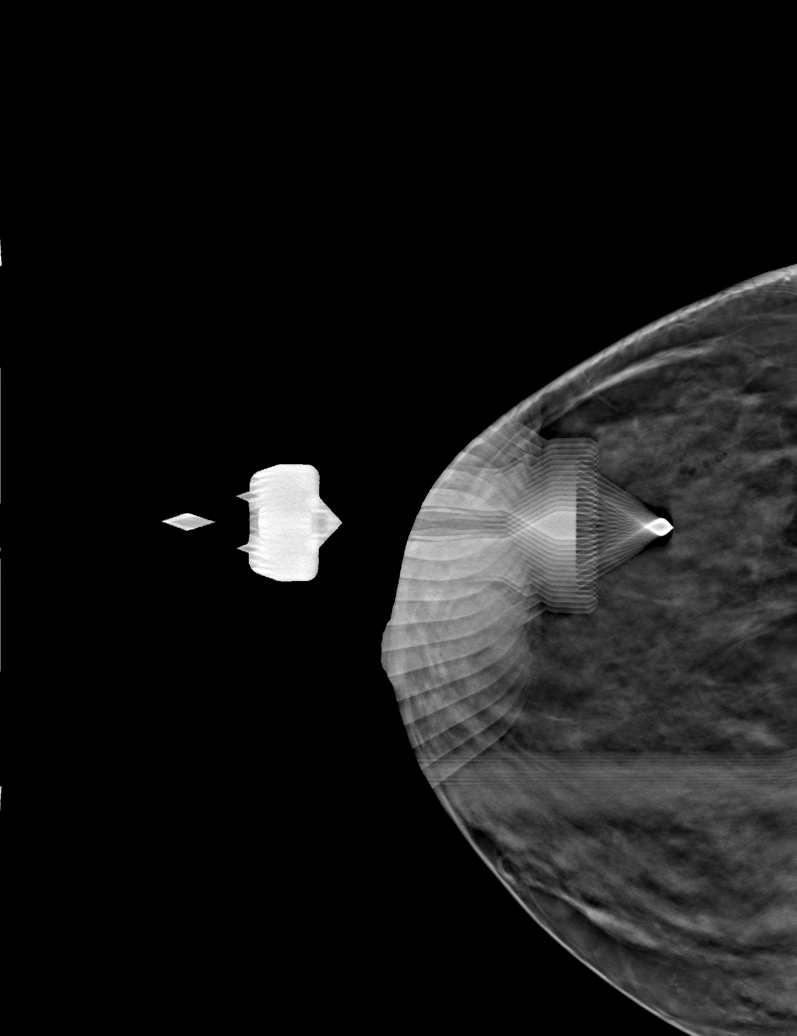

[L CC tomo (3 of 5) · tomo slice 25/50.0]
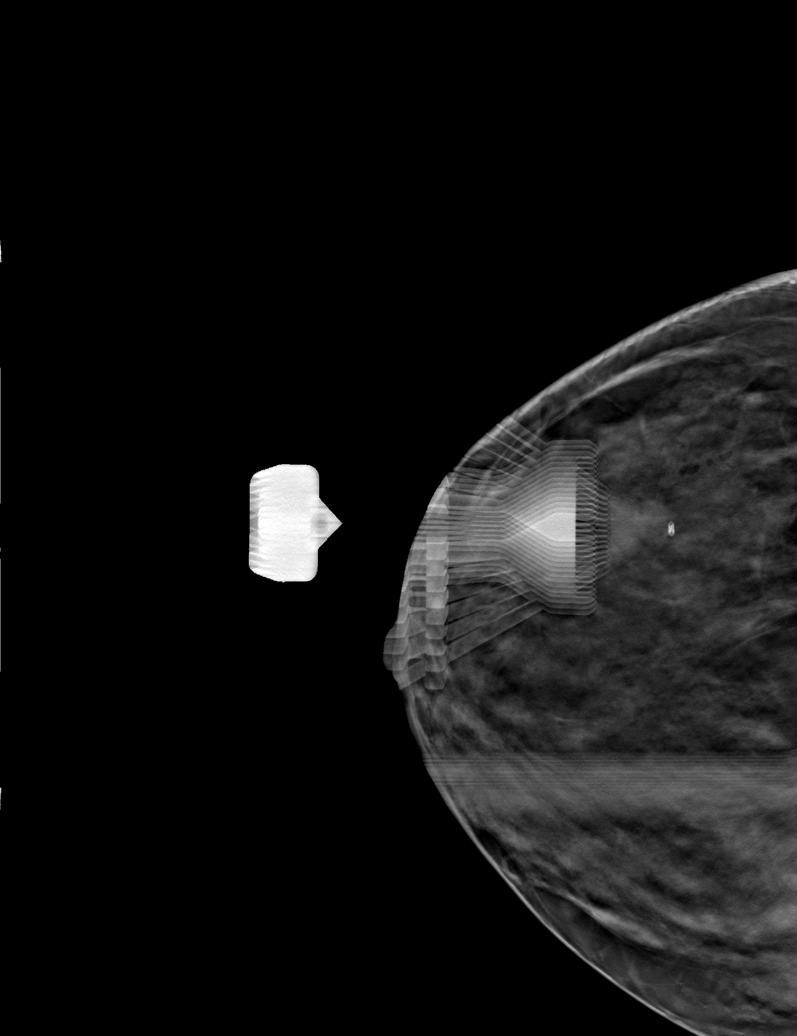

[L CC tomo (4 of 5) · tomo slice 25/50.0]
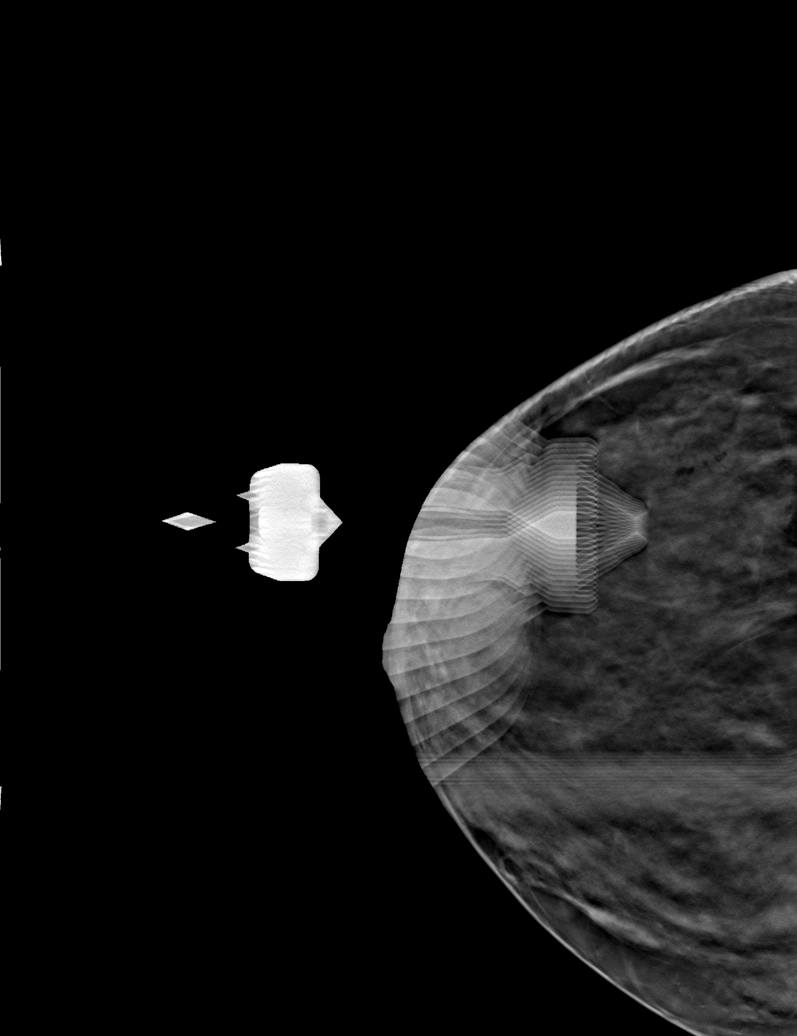

[L CC tomo (5 of 5) · tomo slice 26/51.0]
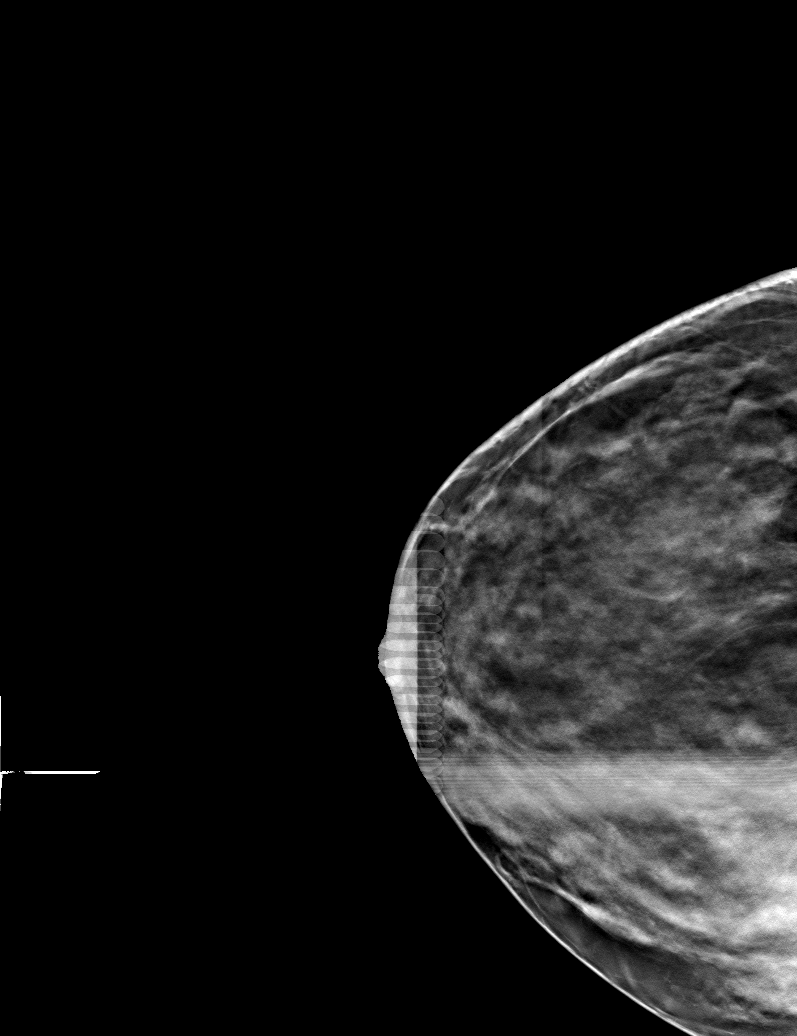

[6 of 26 positions shown; findings below may reference images not displayed]



Using sterile technique and 1% lidocaine as local anesthetic, under
stereotactic guidance, a 9 gauge vacuum assisted device was used to
perform core needle biopsy of calcifications within the upper inner
quadrant of the left breast using a superior craniocaudal approach.
Specimen radiograph was performed showing multiple representative
calcifications within the specimen. Specimens with calcifications
are identified for pathology.

At the conclusion of the procedure, a coil shaped tissue marker clip
was deployed into the biopsy cavity. Follow-up 2-view mammogram was
performed and dictated separately.
IMPRESSION: Stereotactic-guided biopsy of left breast calcifications as
discussed. No apparent complications.

## 2017-09-16 LAB — CBC
HEMATOCRIT: 35.9 % (ref 35.0–45.0)
HEMOGLOBIN: 11.5 g/dL — AB (ref 11.7–15.5)
MCH: 27.9 pg (ref 27.0–33.0)
MCHC: 32 g/dL (ref 32.0–36.0)
MCV: 87.1 fL (ref 80.0–100.0)
MPV: 11.9 fL (ref 7.5–12.5)
Platelets: 189 10*3/uL (ref 140–400)
RBC: 4.12 10*6/uL (ref 3.80–5.10)
RDW: 12.6 % (ref 11.0–15.0)
WBC: 3 10*3/uL — AB (ref 3.8–10.8)

## 2017-09-16 LAB — C-REACTIVE PROTEIN: CRP: 3.1 mg/L (ref <8.0)

## 2017-09-19 ENCOUNTER — Ambulatory Visit (INDEPENDENT_AMBULATORY_CARE_PROVIDER_SITE_OTHER): Payer: Medicaid Other | Admitting: Internal Medicine

## 2017-09-19 ENCOUNTER — Encounter (INDEPENDENT_AMBULATORY_CARE_PROVIDER_SITE_OTHER): Payer: Self-pay | Admitting: Internal Medicine

## 2017-09-19 VITALS — BP 130/78 | HR 86 | Temp 99.4°F | Ht 61.0 in | Wt 179.0 lb

## 2017-09-19 DIAGNOSIS — K5900 Constipation, unspecified: Secondary | ICD-10-CM | POA: Diagnosis not present

## 2017-09-19 DIAGNOSIS — K5190 Ulcerative colitis, unspecified, without complications: Secondary | ICD-10-CM | POA: Diagnosis not present

## 2017-09-19 DIAGNOSIS — K219 Gastro-esophageal reflux disease without esophagitis: Secondary | ICD-10-CM | POA: Diagnosis not present

## 2017-09-19 MED ORDER — FAMOTIDINE 20 MG PO TABS: 20.0000 mg | ORAL_TABLET | Freq: Every evening | ORAL | Status: DC | PRN

## 2017-09-19 MED ORDER — MESALAMINE ER 0.375 G PO CP24: 1500.0000 mg | ORAL_CAPSULE | Freq: Every day | ORAL | 11 refills | Status: DC

## 2017-09-19 NOTE — Patient Instructions (Signed)
Take omeprazole 40 mg 30 minutes before breakfast daily. Take famotidine OTC 20 mg after evening meal or at bedtime on as-needed basis. Take polyethylene glycol or MiraLAX on schedule.  Should be daily or every other day.  Can titrate dose. Use Dulcolax suppository on as-needed basis Please call office with progress report regarding GERD symptoms in 1 month or earlier if necessary.

## 2017-09-19 NOTE — Progress Notes (Signed)
Presenting complaint;  Follow-up for ulcerative colitis GERD and constipation.  Database and subjective:  Patient is 47 year old Afro-American female who has 21-year history of distal ulcerative colitis who is here for scheduled visit.  She was last seen 6 months ago.  She states she is doing well as far as ulcerative colitis is concerned.  She has not passed blood per rectum for a long time.  She complains of constipation.  She generally has 4-5 stools per week.  She has also gone 4 to 5 days without a bowel movement.  She has noted some mucus per rectum but is always clear.  She is not taking ibuprofen anymore.  She states she starts having abdominal pain 2 days after taking this medication and also passes more mucus per rectum.  She says her appetite is good.  She may have heartburn 1-3 times per week.  She also complains of cough and tickle in her throat.  [name] saw her in June this year and gave her medication but she was not able to get prescription filled for financial reasons.  However she does not remember the name of that medication. Her last colonoscopy was in October 2015 revealing mild disease in the distal sigmoid colon and rectum.  Current Medications: Outpatient Encounter Medications as of 09/19/2017  Medication Sig  . acetaminophen (TYLENOL) 500 MG tablet Take 1,000 mg by mouth every 6 (six) hours as needed. For pain  . FLUoxetine (PROZAC) 40 MG capsule Take 1 capsule (40 mg total) by mouth daily. (Patient taking differently: Take 80 mg by mouth daily. )  . gabapentin (NEURONTIN) 300 MG capsule Take 600 mg by mouth at bedtime.   [name] HYDROcodone-acetaminophen (NORCO/VICODIN) 5-325 MG tablet TAKE ONE TABLET BY MOUTH EVERY 4 HOURS AS NEEDED FOR MODERATE PAIN  . ibuprofen (ADVIL,MOTRIN) 200 MG tablet Take 400 mg by mouth every 6 (six) hours as needed for moderate pain.  [name] insulin glargine (LANTUS) 100 UNIT/ML injection Inject 18-20 Units into the skin at bedtime. 18 to 20 units  .  insulin lispro protamine-insulin lispro (HUMALOG 50/50) (50-50) 100 UNIT/ML SUSP Inject 4 Units into the skin 3 (three) times daily. With each meal  . loratadine (CLARITIN) 10 MG tablet TAKE ONE TABLET BY MOUTH DAILY FOR HIVES.  [name] LORazepam (ATIVAN) 0.5 MG tablet Take 1 tablet (0.5 mg total) by mouth every 8 (eight) hours. For nausea or anxiety  . omeprazole (PRILOSEC) 20 MG capsule Take 20 mg by mouth 2 (two) times daily before a meal.   . ondansetron (ZOFRAN ODT) 8 MG disintegrating tablet Take 1 tablet (8 mg total) by mouth every 8 (eight) hours as needed for nausea or vomiting.  . polyethylene glycol powder (GLYCOLAX/MIRALAX) powder Take 8.5 g by mouth daily.  . promethazine (PHENERGAN) 25 MG tablet Take 1 tablet (25 mg total) by mouth every 6 (six) hours as needed for nausea or vomiting.  [name] acyclovir (ZOVIRAX) 200 MG capsule Take 200 mg by mouth 2 (two) times daily. Reported on 07/23/2015  . albuterol (PROVENTIL HFA;VENTOLIN HFA) 108 (90 Base) MCG/ACT inhaler Inhale into the lungs every 6 (six) hours as needed for wheezing or shortness of breath.  . APRISO 0.375 g 24 hr capsule TAKE FOUR (4) CAPSULES BY MOUTH EVERY MORNING  . atorvastatin (LIPITOR) 20 MG tablet Take 20 mg by mouth daily.  [name] buPROPion (WELLBUTRIN) 100 MG tablet Take 300 mg by mouth 2 (two) times daily.  . mesalamine (ROWASA) 4 g enema Place 60 mLs (4 g  total) rectally at bedtime. (Patient taking differently: Place 4 g rectally as needed. )  . mometasone (NASONEX) 50 MCG/ACT nasal spray Place 2 sprays into the nose daily as needed.   . [DISCONTINUED] clotrimazole-betamethasone (LOTRISONE) cream Apply 1 application topically 2 (two) times daily. Apply to groin (Patient not taking: Reported on 05/19/2017)   Facility-Administered Encounter Medications as of 09/19/2017  Medication  . 0.9 %  sodium chloride infusion  . 0.9 %  sodium chloride infusion     Objective: Blood pressure 130/78, pulse 86, temperature 99.4 F (37.4 C),  height 5' 1"  (1.549 m), weight 179 lb (81.2 kg), last menstrual period 09/04/2015. Patient is alert and in no acute distress. Conjunctiva is pink. Sclera is nonicteric Oropharyngeal mucosa is normal. No neck masses or thyromegaly noted. Cardiac exam with regular rhythm normal S1 and S2. No murmur or gallop noted. Lungs are clear to auscultation. Abdomen is full but soft and nontender without organomegaly or masses. Trace edema around ankles.  Labs/studies Results:  Lab data from 09/15/2017 WBC 3.0 H&H 11.5 and 35.9 and MCV 87.1. Platelet count 189K. CRP normal at 3.1.  Serum ferritin was 168 on 06/28/2017.  Assessment:  #1.  Distal ulcerative colitis.  She appears to be in remission.  Last colonoscopy was in October 2015.  Disease duration 21 years.  She should consider surveillance colonoscopy next year.  She will continue oral mesalamine at current dose.  #2.  Chronic constipation.  She is not doing well with therapy.  She needs to take polyethylene glycol on schedule whether that is daily or every other day.  Goal is to prevent her from being constipated.  If she goes more than 2 days without a bowel movement she can use a Dulcolax suppository.  #3.  GERD.  She has both typical and atypical symptoms.  Her cough and throat symptoms appear to be due to GERD.  With long-standing diabetes one has to worry about gastroparesis.  She has been on omeprazole for a long time and may be it is not as effective.  Will change the timing and add H2 B and if she does not feel better we will proceed with further testing.  #4.  Leukopenia and mild anemia.  She has had leukopenia before it probably is normal variant for her.  Her hemoglobin is just below lower limit of normal but close to her baseline.  She does not have iron deficiency.  She may have an element of anemia of chronic disease.  Plan:  New prescription for Apriso sent to her pharmacy.  1 month supply with 11 refills. She will take  omeprazole 40 mg 30 minutes before breakfast instead of splitting the dose. Famotidine OTC 20 mg either after evening meal or at bedtime. Patient advised to take polyethylene glycol daily or every other day and she can titrate the dose. Can use Dulcolax suppository on as-needed basis.  She can use it if she goes more than 2 days without bowel movement. She will call with progress report in 4 weeks regarding her GERD symptoms. Office visit in 1 year.

## 2017-11-08 ENCOUNTER — Inpatient Hospital Stay (HOSPITAL_COMMUNITY): Payer: Medicaid Other | Attending: Internal Medicine

## 2017-11-08 ENCOUNTER — Other Ambulatory Visit (HOSPITAL_COMMUNITY): Payer: Medicaid Other

## 2017-11-08 DIAGNOSIS — Z9221 Personal history of antineoplastic chemotherapy: Secondary | ICD-10-CM | POA: Diagnosis not present

## 2017-11-08 DIAGNOSIS — Z79899 Other long term (current) drug therapy: Secondary | ICD-10-CM | POA: Insufficient documentation

## 2017-11-08 DIAGNOSIS — K5190 Ulcerative colitis, unspecified, without complications: Secondary | ICD-10-CM | POA: Diagnosis not present

## 2017-11-08 DIAGNOSIS — Z171 Estrogen receptor negative status [ER-]: Secondary | ICD-10-CM | POA: Insufficient documentation

## 2017-11-08 DIAGNOSIS — C50411 Malignant neoplasm of upper-outer quadrant of right female breast: Secondary | ICD-10-CM | POA: Insufficient documentation

## 2017-11-08 DIAGNOSIS — D500 Iron deficiency anemia secondary to blood loss (chronic): Secondary | ICD-10-CM | POA: Diagnosis not present

## 2017-11-08 DIAGNOSIS — Z923 Personal history of irradiation: Secondary | ICD-10-CM | POA: Insufficient documentation

## 2017-11-08 DIAGNOSIS — N912 Amenorrhea, unspecified: Secondary | ICD-10-CM

## 2017-11-08 LAB — COMPREHENSIVE METABOLIC PANEL
ALT: 16 U/L (ref 0–44)
ANION GAP: 5 (ref 5–15)
AST: 18 U/L (ref 15–41)
Albumin: 3.8 g/dL (ref 3.5–5.0)
Alkaline Phosphatase: 67 U/L (ref 38–126)
BILIRUBIN TOTAL: 0.9 mg/dL (ref 0.3–1.2)
BUN: 6 mg/dL (ref 6–20)
CO2: 26 mmol/L (ref 22–32)
Calcium: 8.7 mg/dL — ABNORMAL LOW (ref 8.9–10.3)
Chloride: 108 mmol/L (ref 98–111)
Creatinine, Ser: 0.43 mg/dL — ABNORMAL LOW (ref 0.44–1.00)
Glucose, Bld: 94 mg/dL (ref 70–99)
Potassium: 3.6 mmol/L (ref 3.5–5.1)
Sodium: 139 mmol/L (ref 135–145)
TOTAL PROTEIN: 6.4 g/dL — AB (ref 6.5–8.1)

## 2017-11-08 LAB — CBC WITH DIFFERENTIAL/PLATELET
Abs Immature Granulocytes: 0.01 10*3/uL (ref 0.00–0.07)
Basophils Absolute: 0 10*3/uL (ref 0.0–0.1)
Basophils Relative: 0 %
EOS ABS: 0 10*3/uL (ref 0.0–0.5)
EOS PCT: 0 %
HEMATOCRIT: 38.8 % (ref 36.0–46.0)
Hemoglobin: 12.1 g/dL (ref 12.0–15.0)
Immature Granulocytes: 0 %
LYMPHS ABS: 1.5 10*3/uL (ref 0.7–4.0)
Lymphocytes Relative: 34 %
MCH: 28.1 pg (ref 26.0–34.0)
MCHC: 31.2 g/dL (ref 30.0–36.0)
MCV: 90.2 fL (ref 80.0–100.0)
MONO ABS: 0.3 10*3/uL (ref 0.1–1.0)
MONOS PCT: 8 %
Neutro Abs: 2.5 10*3/uL (ref 1.7–7.7)
Neutrophils Relative %: 58 %
Platelets: 195 10*3/uL (ref 150–400)
RBC: 4.3 MIL/uL (ref 3.87–5.11)
RDW: 13 % (ref 11.5–15.5)
WBC: 4.3 10*3/uL (ref 4.0–10.5)
nRBC: 0 % (ref 0.0–0.2)

## 2017-11-08 LAB — LACTATE DEHYDROGENASE: LDH: 139 U/L (ref 98–192)

## 2017-11-08 LAB — FERRITIN: FERRITIN: 37 ng/mL (ref 11–307)

## 2017-11-09 LAB — LUTEINIZING HORMONE: LH: 10.5 m[IU]/mL

## 2017-11-09 LAB — FOLLICLE STIMULATING HORMONE: FSH: 8.5 m[IU]/mL

## 2017-11-09 LAB — ESTRADIOL: ESTRADIOL: 215.6 pg/mL

## 2017-11-15 ENCOUNTER — Other Ambulatory Visit: Payer: Self-pay

## 2017-11-15 ENCOUNTER — Encounter (HOSPITAL_COMMUNITY): Payer: Self-pay | Admitting: Hematology

## 2017-11-15 ENCOUNTER — Inpatient Hospital Stay (HOSPITAL_BASED_OUTPATIENT_CLINIC_OR_DEPARTMENT_OTHER): Payer: Medicaid Other | Admitting: Hematology

## 2017-11-15 DIAGNOSIS — Z79899 Other long term (current) drug therapy: Secondary | ICD-10-CM

## 2017-11-15 DIAGNOSIS — C50411 Malignant neoplasm of upper-outer quadrant of right female breast: Secondary | ICD-10-CM

## 2017-11-15 DIAGNOSIS — Z171 Estrogen receptor negative status [ER-]: Secondary | ICD-10-CM | POA: Diagnosis not present

## 2017-11-15 DIAGNOSIS — D500 Iron deficiency anemia secondary to blood loss (chronic): Secondary | ICD-10-CM | POA: Diagnosis not present

## 2017-11-15 DIAGNOSIS — K5190 Ulcerative colitis, unspecified, without complications: Secondary | ICD-10-CM | POA: Diagnosis not present

## 2017-11-15 DIAGNOSIS — Z9221 Personal history of antineoplastic chemotherapy: Secondary | ICD-10-CM

## 2017-11-15 DIAGNOSIS — Z923 Personal history of irradiation: Secondary | ICD-10-CM

## 2017-11-15 NOTE — Progress Notes (Signed)
Patient Care Team: [name], MD as PCP - General (Unknown Physician Specialty)  DIAGNOSIS:  Encounter Diagnoses  Name Primary?  . Iron deficiency anemia due to chronic blood loss Yes  . Malignant neoplasm of upper-outer quadrant of right breast in female, estrogen receptor negative ([place])     SUMMARY OF ONCOLOGIC HISTORY:   Malignant neoplasm of upper-outer quadrant of right female breast ([place])   04/29/2015 Initial Biopsy    upper outer quadrant mass R breast 2.9 cm by ultrasound, high grade invasive ductal carcinoma ER< 1%, PR 1%, HER -2 not amplified, Ki-67 66%    05/13/2015 Cancer Staging    [id number]    05/13/2015 Surgery    lumpectomy and sentinel node biopsies X 3, 2 cm tumor, 3 negative nodes     05/21/2015 Imaging    CT [facility]/P [facility] with postsurgical changes related to R breast lympectomy and R axillary LN dissection, no findings suspicious for metastatic disease    06/12/2015 Procedure    Port placement by Dr. Anthony Sar    06/17/2015 Imaging    MUGA- Normal left ventricular ejection fraction equal to 62.9%.    06/25/2015 - 08/13/2015 Chemotherapy    AC x 4    08/27/2015 - 09/03/2015 Chemotherapy    Weekly Taxol x 2 cycles     09/11/2015 - 09/15/2015 Hospital Admission    Neutropenic fever, ARF secondary to antibiotics    10/01/2015 - 11/12/2015 Chemotherapy    Taxotere 75 mg/m2 Q3 weeks x 3 cycles      10/01/2015 Treatment Plan Change    Taxol changed to Taxotere given hospitalization.      - 01/14/2016 Radiation Therapy    Radiation in [facility]    05/11/2016 Mammogram    IMPRESSION: No evidence of malignancy within either breast. Expected postsurgical changes within the right breast.      CHIEF COMPLIANT: Follow-up for breast cancer and iron deficiency anemia.  INTERVAL HISTORY: Brittney [name] is a 47 year old very pleasant female who is seen in follow-up visit for breast cancer and iron deficiency anemia.  She denies any new onset pains.  Fatigue has  been stable.  Appetite is 100%.  Energy levels are 50%.  Numbness in the right arm has been stable.  Her ulcerative colitis has been stable.  She is on mesalamine.  Denies any fevers or infections.  REVIEW OF SYSTEMS:   Constitutional: Denies fevers, chills or abnormal weight loss.  Positive for fatigue. Eyes: Denies blurriness of vision Ears, nose, mouth, throat, and face: Denies mucositis or sore throat Respiratory: Denies cough, dyspnea or wheezes Cardiovascular: Denies palpitation, chest discomfort.   Gastrointestinal:  Denies nausea, heartburn or change in bowel habits Skin: Denies abnormal skin rashes Lymphatics: Denies new lymphadenopathy or easy bruising Neurological:Denies numbness, tingling or new weaknesses Behavioral/Psych: Mood is stable, no new changes  Extremities: Positive leg swelling. Breast:  denies any pain or lumps or nodules in either breasts All other systems were reviewed with the patient and are negative.  I have reviewed the past medical history, past surgical history, social history and family history with the patient and they are unchanged from previous note.  ALLERGIES:  is allergic to ace inhibitors; adhesive [tape]; pravastatin sodium; statins; peanuts [peanut oil]; and shellfish allergy.  MEDICATIONS:  Current Outpatient Medications  Medication Sig Dispense Refill  . acetaminophen (TYLENOL) 500 MG tablet Take 1,000 mg by mouth every 6 (six) hours as needed. For pain    . acyclovir (ZOVIRAX) 200 MG capsule  Take 200 mg by mouth 2 (two) times daily. Reported on 07/23/2015    . albuterol (PROVENTIL HFA;VENTOLIN HFA) 108 (90 Base) MCG/ACT inhaler Inhale into the lungs every 6 (six) hours as needed for wheezing or shortness of breath.    [name] atorvastatin (LIPITOR) 20 MG tablet Take 20 mg by mouth daily.    . famotidine (PEPCID) 20 MG tablet Take 1 tablet (20 mg total) by mouth at bedtime as needed for heartburn or indigestion.    . gabapentin (NEURONTIN) 300 MG  capsule Take 600 mg by mouth at bedtime. 300 mg in am and 600 mg at bedtime.  6  . HUMALOG 100 UNIT/ML injection INJECT 70 UNITS SUBCUTANOUSLY THREE TIMES DAILY  6  . HYDROcodone-acetaminophen (NORCO/VICODIN) 5-325 MG tablet TAKE ONE TABLET BY MOUTH EVERY 4 HOURS AS NEEDED FOR MODERATE PAIN 30 tablet 0  . hydrOXYzine (ATARAX/VISTARIL) 25 MG tablet Take by mouth.    . insulin lispro protamine-insulin lispro (HUMALOG 50/50) (50-50) 100 UNIT/ML SUSP Inject 4 Units into the skin 3 (three) times daily. With each meal    . LANTUS SOLOSTAR 100 UNIT/ML Solostar Pen INJECT 30 UNITS SUBCUTANEOUSLY AT BEDTIME FOR DIABETES.  6  . loratadine (CLARITIN) 10 MG tablet TAKE ONE TABLET BY MOUTH DAILY FOR HIVES.  6  . LORazepam (ATIVAN) 0.5 MG tablet Take 1 tablet (0.5 mg total) by mouth every 8 (eight) hours. For nausea or anxiety 45 tablet 0  . mesalamine (APRISO) 0.375 g 24 hr capsule Take 4 capsules (1.5 g total) by mouth daily. 120 capsule 11  . mometasone (NASONEX) 50 MCG/ACT nasal spray Place 2 sprays into the nose daily as needed.     [name] omeprazole (PRILOSEC) 20 MG capsule Take 40 mg by mouth daily before breakfast.     . ondansetron (ZOFRAN ODT) 8 MG disintegrating tablet Take 1 tablet (8 mg total) by mouth every 8 (eight) hours as needed for nausea or vomiting. 30 tablet 2  . polyethylene glycol powder (GLYCOLAX/MIRALAX) powder Take 8.5 g by mouth daily. 255 g 5  . promethazine (PHENERGAN) 25 MG tablet Take 1 tablet (25 mg total) by mouth every 6 (six) hours as needed for nausea or vomiting. 30 tablet 2   No current facility-administered medications for this visit.    Facility-Administered Medications Ordered in Other Visits  Medication Dose Route Frequency Provider Last Rate Last Dose  . 0.9 %  sodium chloride infusion   Intravenous Continuous Kefalas, Manon Hilding, PA-C      . 0.9 %  sodium chloride infusion   Intravenous Continuous Higgs, Mathis Dad, MD   Stopped at 05/26/17 1510    PHYSICAL  EXAMINATION: ECOG PERFORMANCE STATUS: 1 - Symptomatic but completely ambulatory  I have reviewed her vitals.  Blood pressure is 159/85.  Pulse rate is 72.  Respiratory rate is 16.  Oxygen is 100%. GENERAL:alert, no distress and comfortable SKIN: skin color, texture, turgor are normal, no rashes or significant lesions LYMPH:  no palpable lymphadenopathy in the cervical, axillary or inguinal LUNGS: clear to auscultation and percussion with normal breathing effort HEART: regular rate & rhythm and no murmurs and no lower extremity edema ABDOMEN:abdomen soft, non-tender and normal bowel sounds MUSCULOSKELETAL:no cyanosis of digits and no clubbing   EXTREMITIES: No lower extremity edema BREAST: Right lumpectomy site is within normal limits.  No palpable mass in bilateral breasts.  No palpable adenopathy in the supraclavicular and axillary region.  LABORATORY DATA:  I have reviewed the data as listed CMP Latest Ref  Rng & Units 11/08/2017 06/28/2017 05/11/2017  Glucose 70 - 99 mg/dL 94 146(H) 312(H)  BUN 6 - 20 mg/dL 6 8 9   Creatinine 0.44 - 1.00 mg/dL 0.43(L) 0.53 0.60  Sodium 135 - 145 mmol/L 139 140 137  Potassium 3.5 - 5.1 mmol/L 3.6 4.2 4.1  Chloride 98 - 111 mmol/L 108 106 101  CO2 22 - 32 mmol/L 26 27 26   Calcium 8.9 - 10.3 mg/dL 8.7(L) 8.6(L) 8.8(L)  Total Protein 6.5 - 8.1 g/dL 6.4(L) 6.4(L) 6.3(L)  Total Bilirubin 0.3 - 1.2 mg/dL 0.9 0.5 0.5  Alkaline Phos 38 - 126 U/L 67 84 64  AST 15 - 41 U/L 18 24 21   ALT 0 - 44 U/L 16 24 14    No results found for: CAN153   Lab Results  Component Value Date   WBC 4.3 11/08/2017   HGB 12.1 11/08/2017   HCT 38.8 11/08/2017   MCV 90.2 11/08/2017   PLT 195 11/08/2017   NEUTROABS 2.5 11/08/2017    ASSESSMENT & PLAN:  Malignant neoplasm of upper-outer quadrant of right female breast ([facility]) 1.  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 Wonda Cheng on 05/13/2015 - Adjuvant  chemotherapy with dose dense AC followed by 3 cycles of dose dense docetaxel from 06/25/2015 through 11/12/2015 - Last mammogram on 05/09/2017 reviewed by me was BI-RADS Category 2. - Today's physical examination was within normal limits.  I have reviewed blood work which was also within normal limits. -She will come back in 4 months for follow-up. -We will consider checking her bone density down the line.  2.  Iron deficiency state: -This is from chronic blood loss from ulcerative colitis. -She received Injectafer on 05/19/2017 and 05/26/2017. -Her ferritin has dropped to 37 from 168.  She also complains of feeling very tired.  Hence I have recommended 2 infusions of Feraheme 1 week apart.  We talked about side effects in detail.  3.  Ulcerative colitis: -This is stable for the past 21 years.  Last colonoscopy was in 2015.  She will continue mesalamine.  Breast Cancer therapy associated bone loss: I have recommended calcium, Vitamin D and weight bearing exercises.  I spent 25 minutes talking to the patient of which more than half was spent in counseling and coordination of care.  Orders Placed This Encounter  Procedures  . Comprehensive metabolic panel    Standing Status:   Future    Standing Expiration Date:   11/15/2018  . CBC with Differential    Standing Status:   Future    Standing Expiration Date:   11/15/2018  . Ferritin    Standing Status:   Future    Standing Expiration Date:   11/15/2018  . Iron and TIBC    Standing Status:   Future    Standing Expiration Date:   11/15/2018  . Vitamin B12    Standing Status:   Future    Standing Expiration Date:   11/15/2018  . Folate    Standing Status:   Future    Standing Expiration Date:   11/15/2018   The patient has a good understanding of the overall plan. she agrees with it. she will call with any problems that may develop before the next visit here.   Derek Jack, MD 11/15/17

## 2017-11-15 NOTE — Assessment & Plan Note (Addendum)
1.  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 Wonda Cheng on 05/13/2015 - Adjuvant chemotherapy with dose dense AC followed by 3 cycles of dose dense docetaxel from 06/25/2015 through 11/12/2015 - Last mammogram on 05/09/2017 reviewed by me was BI-RADS Category 2. - Today's physical examination was within normal limits.  I have reviewed blood work which was also within normal limits. -She will come back in 4 months for follow-up. -We will consider checking her bone density down the line.  2.  Iron deficiency state: -This is from chronic blood loss from ulcerative colitis. -She received Injectafer on 05/19/2017 and 05/26/2017. -Her ferritin has dropped to 37 from 168.  She also complains of feeling very tired.  Hence I have recommended 2 infusions of Feraheme 1 week apart.  We talked about side effects in detail.  3.  Ulcerative colitis: -This is stable for the past 21 years.  Last colonoscopy was in 2015.  She will continue mesalamine.

## 2017-11-17 ENCOUNTER — Encounter (HOSPITAL_COMMUNITY): Payer: Self-pay | Admitting: Hematology

## 2017-11-20 ENCOUNTER — Inpatient Hospital Stay (HOSPITAL_COMMUNITY): Payer: Medicaid Other

## 2017-11-20 ENCOUNTER — Encounter (HOSPITAL_COMMUNITY): Payer: Self-pay

## 2017-11-20 VITALS — BP 138/74 | HR 70 | Temp 98.0°F | Resp 18

## 2017-11-20 DIAGNOSIS — C50411 Malignant neoplasm of upper-outer quadrant of right female breast: Secondary | ICD-10-CM | POA: Diagnosis not present

## 2017-11-20 DIAGNOSIS — D500 Iron deficiency anemia secondary to blood loss (chronic): Secondary | ICD-10-CM

## 2017-11-20 MED ORDER — SODIUM CHLORIDE 0.9 % IV SOLN
510.0000 mg | Freq: Once | INTRAVENOUS | Status: AC
  Administered 2017-11-20: 510 mg via INTRAVENOUS
  Filled 2017-11-20: qty 17

## 2017-11-20 MED ORDER — SODIUM CHLORIDE 0.9 % IV SOLN
INTRAVENOUS | Status: DC
  Administered 2017-11-20: 13:00:00 via INTRAVENOUS

## 2017-11-20 NOTE — Patient Instructions (Signed)
[facility] at [facility]  Discharge Instructions:   _______________________________________________________________  Thank you for choosing [facility] at [facility] to provide your oncology and hematology care.  To afford each patient quality time with our providers, please arrive at least 15 minutes before your scheduled appointment.  You need to re-schedule your appointment if you arrive 10 or more minutes late.  We strive to give you quality time with our providers, and arriving late affects you and other patients whose appointments are after yours.  Also, if you no show three or more times for appointments you may be dismissed from the clinic.  Again, thank you for choosing [facility]. Our hope is that these requests will allow you access to exceptional care and in a timely manner. _______________________________________________________________  If you have questions after your visit, please contact our office at [phone] between the hours of 8:30 a.m. and 5:00 p.m. Voicemails left after 4:30 p.m. will not be returned until the following business day. _______________________________________________________________  For prescription refill requests, have your pharmacy contact our office. _______________________________________________________________  Recommendations made by the consultant and any test results will be sent to your referring physician. _______________________________________________________________ 

## 2017-11-20 NOTE — Progress Notes (Signed)
Patient tolerated iron infusion with no complaints voiced.  Good blood return noted before and after administration of therapy.  Band aid applied.  VSS with discharge and left ambulatory with no s/s of distress noted.

## 2017-11-27 ENCOUNTER — Inpatient Hospital Stay (HOSPITAL_COMMUNITY): Payer: Medicaid Other

## 2017-11-27 ENCOUNTER — Encounter (HOSPITAL_COMMUNITY): Payer: Self-pay

## 2017-11-27 VITALS — BP 132/69 | HR 71 | Temp 98.5°F | Resp 18

## 2017-11-27 DIAGNOSIS — D500 Iron deficiency anemia secondary to blood loss (chronic): Secondary | ICD-10-CM

## 2017-11-27 DIAGNOSIS — C50411 Malignant neoplasm of upper-outer quadrant of right female breast: Secondary | ICD-10-CM | POA: Diagnosis not present

## 2017-11-27 MED ORDER — SODIUM CHLORIDE 0.9 % IV SOLN
INTRAVENOUS | Status: DC
  Administered 2017-11-27: 13:00:00 via INTRAVENOUS

## 2017-11-27 MED ORDER — SODIUM CHLORIDE 0.9 % IV SOLN
510.0000 mg | Freq: Once | INTRAVENOUS | Status: AC
  Administered 2017-11-27: 510 mg via INTRAVENOUS
  Filled 2017-11-27: qty 17

## 2017-11-27 NOTE — Patient Instructions (Signed)
[facility] at [facility] Discharge Instructions  Received Feraheme infusion today. Follow-up as scheduled. Call clinic for any questions or concerns   Thank you for choosing [facility] at [facility] to provide your oncology and hematology care.  To afford each patient quality time with our provider, please arrive at least 15 minutes before your scheduled appointment time.   If you have a lab appointment with the Cancer Center please come in thru the  Main Entrance and check in at the main information desk  You need to re-schedule your appointment should you arrive 10 or more minutes late.  We strive to give you quality time with our providers, and arriving late affects you and other patients whose appointments are after yours.  Also, if you no show three or more times for appointments you may be dismissed from the clinic at the providers discretion.     Again, thank you for choosing [facility].  Our hope is that these requests will decrease the amount of time that you wait before being seen by our physicians.       _____________________________________________________________  Should you have questions after your visit to [facility], please contact our office at [phone] between the hours of 8:00 a.m. and 4:30 p.m.  Voicemails left after 4:00 p.m. will not be returned until the following business day.  For prescription refill requests, have your pharmacy contact our office and allow 72 hours.    Cancer Center Support Programs:   > Cancer Support Group  2nd Tuesday of the month 1pm-2pm, Journey Room   

## 2017-11-27 NOTE — Progress Notes (Signed)
Brittney [name] tolerated Feraheme infusion well without complaints or incident. Peripheral IV site checked with positive blood return noted prior to and after infusion. VSS upon discharge. Pt discharged self ambulatory in satisfactory condition

## 2018-01-30 LAB — LIPID PANEL
CHOLESTEROL: 154 (ref 0–200)
HDL: 66 (ref 35–70)
LDL Cholesterol: 76
Triglycerides: 59 (ref 40–160)

## 2018-01-30 LAB — HEMOGLOBIN A1C: Hemoglobin A1C: 7.7

## 2018-01-30 LAB — BASIC METABOLIC PANEL
BUN: 7 (ref 4–21)
Creatinine: 0.6 (ref 0.5–1.1)

## 2018-03-12 ENCOUNTER — Inpatient Hospital Stay (HOSPITAL_COMMUNITY): Payer: Medicaid Other | Attending: Hematology

## 2018-03-12 DIAGNOSIS — F329 Major depressive disorder, single episode, unspecified: Secondary | ICD-10-CM | POA: Insufficient documentation

## 2018-03-12 DIAGNOSIS — Z7952 Long term (current) use of systemic steroids: Secondary | ICD-10-CM | POA: Diagnosis not present

## 2018-03-12 DIAGNOSIS — Z794 Long term (current) use of insulin: Secondary | ICD-10-CM | POA: Diagnosis not present

## 2018-03-12 DIAGNOSIS — Z800 Family history of malignant neoplasm of digestive organs: Secondary | ICD-10-CM | POA: Diagnosis not present

## 2018-03-12 DIAGNOSIS — Z9221 Personal history of antineoplastic chemotherapy: Secondary | ICD-10-CM | POA: Insufficient documentation

## 2018-03-12 DIAGNOSIS — Z171 Estrogen receptor negative status [ER-]: Secondary | ICD-10-CM | POA: Insufficient documentation

## 2018-03-12 DIAGNOSIS — R51 Headache: Secondary | ICD-10-CM | POA: Insufficient documentation

## 2018-03-12 DIAGNOSIS — K5190 Ulcerative colitis, unspecified, without complications: Secondary | ICD-10-CM | POA: Insufficient documentation

## 2018-03-12 DIAGNOSIS — Z807 Family history of other malignant neoplasms of lymphoid, hematopoietic and related tissues: Secondary | ICD-10-CM | POA: Insufficient documentation

## 2018-03-12 DIAGNOSIS — D500 Iron deficiency anemia secondary to blood loss (chronic): Secondary | ICD-10-CM | POA: Diagnosis not present

## 2018-03-12 DIAGNOSIS — G478 Other sleep disorders: Secondary | ICD-10-CM | POA: Insufficient documentation

## 2018-03-12 DIAGNOSIS — Z923 Personal history of irradiation: Secondary | ICD-10-CM | POA: Diagnosis not present

## 2018-03-12 DIAGNOSIS — C50411 Malignant neoplasm of upper-outer quadrant of right female breast: Secondary | ICD-10-CM | POA: Insufficient documentation

## 2018-03-12 DIAGNOSIS — Z803 Family history of malignant neoplasm of breast: Secondary | ICD-10-CM | POA: Diagnosis not present

## 2018-03-12 DIAGNOSIS — Z79899 Other long term (current) drug therapy: Secondary | ICD-10-CM | POA: Diagnosis not present

## 2018-03-12 DIAGNOSIS — R5383 Other fatigue: Secondary | ICD-10-CM | POA: Diagnosis not present

## 2018-03-12 LAB — COMPREHENSIVE METABOLIC PANEL
ALT: 15 U/L (ref 0–44)
ANION GAP: 7 (ref 5–15)
AST: 19 U/L (ref 15–41)
Albumin: 3.8 g/dL (ref 3.5–5.0)
Alkaline Phosphatase: 80 U/L (ref 38–126)
BUN: 8 mg/dL (ref 6–20)
CO2: 30 mmol/L (ref 22–32)
Calcium: 9 mg/dL (ref 8.9–10.3)
Chloride: 103 mmol/L (ref 98–111)
Creatinine, Ser: 0.47 mg/dL (ref 0.44–1.00)
GFR calc non Af Amer: >60 mL/min (ref >60)
Glucose, Bld: 297 mg/dL — ABNORMAL HIGH (ref 70–99)
Potassium: 3.9 mmol/L (ref 3.5–5.1)
Sodium: 140 mmol/L (ref 135–145)
Total Bilirubin: 0.8 mg/dL (ref 0.3–1.2)
Total Protein: 6.4 g/dL — ABNORMAL LOW (ref 6.5–8.1)

## 2018-03-12 LAB — IRON AND TIBC
Iron: 88 ug/dL (ref 28–170)
Saturation Ratios: 27 % (ref 10.4–31.8)
TIBC: 322 ug/dL (ref 250–450)
UIBC: 234 ug/dL

## 2018-03-12 LAB — CBC WITH DIFFERENTIAL/PLATELET
Abs Immature Granulocytes: 0.02 10*3/uL (ref 0.00–0.07)
BASOS ABS: 0 10*3/uL (ref 0.0–0.1)
Basophils Relative: 0 %
Eosinophils Absolute: 0 10*3/uL (ref 0.0–0.5)
Eosinophils Relative: 0 %
HCT: 39.3 % (ref 36.0–46.0)
Hemoglobin: 12.1 g/dL (ref 12.0–15.0)
Immature Granulocytes: 1 %
Lymphocytes Relative: 36 %
Lymphs Abs: 1.3 10*3/uL (ref 0.7–4.0)
MCH: 27.9 pg (ref 26.0–34.0)
MCHC: 30.8 g/dL (ref 30.0–36.0)
MCV: 90.8 fL (ref 80.0–100.0)
Monocytes Absolute: 0.3 10*3/uL (ref 0.1–1.0)
Monocytes Relative: 7 %
NEUTROS ABS: 2 10*3/uL (ref 1.7–7.7)
NRBC: 0 % (ref 0.0–0.2)
Neutrophils Relative %: 56 %
Platelets: 211 10*3/uL (ref 150–400)
RBC: 4.33 MIL/uL (ref 3.87–5.11)
RDW: 14.4 % (ref 11.5–15.5)
WBC: 3.6 10*3/uL — ABNORMAL LOW (ref 4.0–10.5)

## 2018-03-12 LAB — FOLATE: FOLATE: 9.9 ng/mL (ref >5.9)

## 2018-03-12 LAB — FERRITIN: Ferritin: 186 ng/mL (ref 11–307)

## 2018-03-12 LAB — VITAMIN B12: Vitamin B-12: 315 pg/mL (ref 180–914)

## 2018-03-19 ENCOUNTER — Other Ambulatory Visit: Payer: Self-pay

## 2018-03-19 ENCOUNTER — Encounter (HOSPITAL_COMMUNITY): Payer: Self-pay | Admitting: Hematology

## 2018-03-19 ENCOUNTER — Inpatient Hospital Stay (HOSPITAL_BASED_OUTPATIENT_CLINIC_OR_DEPARTMENT_OTHER): Payer: Medicaid Other | Admitting: Hematology

## 2018-03-19 VITALS — BP 135/90 | HR 66 | Temp 98.3°F | Resp 14 | Wt 192.0 lb

## 2018-03-19 DIAGNOSIS — D500 Iron deficiency anemia secondary to blood loss (chronic): Secondary | ICD-10-CM

## 2018-03-19 DIAGNOSIS — R51 Headache: Secondary | ICD-10-CM

## 2018-03-19 DIAGNOSIS — C50411 Malignant neoplasm of upper-outer quadrant of right female breast: Secondary | ICD-10-CM

## 2018-03-19 DIAGNOSIS — Z794 Long term (current) use of insulin: Secondary | ICD-10-CM

## 2018-03-19 DIAGNOSIS — G478 Other sleep disorders: Secondary | ICD-10-CM

## 2018-03-19 DIAGNOSIS — Z7952 Long term (current) use of systemic steroids: Secondary | ICD-10-CM

## 2018-03-19 DIAGNOSIS — R5383 Other fatigue: Secondary | ICD-10-CM

## 2018-03-19 DIAGNOSIS — K5190 Ulcerative colitis, unspecified, without complications: Secondary | ICD-10-CM

## 2018-03-19 DIAGNOSIS — Z171 Estrogen receptor negative status [ER-]: Secondary | ICD-10-CM | POA: Diagnosis not present

## 2018-03-19 DIAGNOSIS — Z923 Personal history of irradiation: Secondary | ICD-10-CM | POA: Diagnosis not present

## 2018-03-19 DIAGNOSIS — Z807 Family history of other malignant neoplasms of lymphoid, hematopoietic and related tissues: Secondary | ICD-10-CM

## 2018-03-19 DIAGNOSIS — Z79899 Other long term (current) drug therapy: Secondary | ICD-10-CM

## 2018-03-19 DIAGNOSIS — Z9221 Personal history of antineoplastic chemotherapy: Secondary | ICD-10-CM

## 2018-03-19 DIAGNOSIS — Z803 Family history of malignant neoplasm of breast: Secondary | ICD-10-CM

## 2018-03-19 DIAGNOSIS — F329 Major depressive disorder, single episode, unspecified: Secondary | ICD-10-CM

## 2018-03-19 DIAGNOSIS — Z800 Family history of malignant neoplasm of digestive organs: Secondary | ICD-10-CM

## 2018-03-19 NOTE — Progress Notes (Signed)
Brittney [name], [place] [address]   CLINIC:  Medical Oncology/Hematology  PCP:  [name], MD [place] [address] [phone]   REASON FOR VISIT:  Follow-up for Iron deficiency anemia due to chronic blood loss, Malignant neoplasm of upper-outer quadrant of right breast in female, estrogen receptor negative.  BRIEF ONCOLOGIC HISTORY:    Malignant neoplasm of upper-outer quadrant of right female breast ([place])   04/29/2015 Initial Biopsy    upper outer quadrant mass R breast 2.9 cm by ultrasound, high grade invasive ductal carcinoma ER< 1%, PR 1%, HER -2 not amplified, Ki-67 66%    05/13/2015 Cancer Staging    pT1cN0    05/13/2015 Surgery    lumpectomy and sentinel node biopsies X 3, 2 cm tumor, 3 negative nodes     05/21/2015 Imaging    CT [facility] with postsurgical changes related to R breast lympectomy and R axillary LN dissection, no findings suspicious for metastatic disease    06/12/2015 Procedure    Port placement by Dr. Anthony Sar    06/17/2015 Imaging    MUGA- Normal left ventricular ejection fraction equal to 62.9%.    06/25/2015 - 08/13/2015 Chemotherapy    AC x 4    08/27/2015 - 09/03/2015 Chemotherapy    Weekly Taxol x 2 cycles     09/11/2015 - 09/15/2015 Hospital Admission    Neutropenic fever, ARF secondary to antibiotics    10/01/2015 - 11/12/2015 Chemotherapy    Taxotere 75 mg/m2 Q3 weeks x 3 cycles      10/01/2015 Treatment Plan Change    Taxol changed to Taxotere given hospitalization.      - 01/14/2016 Radiation Therapy    Radiation in [facility]    05/11/2016 Mammogram    IMPRESSION: No evidence of malignancy within either breast. Expected postsurgical changes within the right breast.       CANCER STAGING: Cancer Staging Malignant neoplasm of upper-outer quadrant of right female breast [facility]) Staging form: Breast, AJCC 7th Edition - Clinical stage from 05/13/2015: Stage IA (T1c, N0, M0) - Signed  by Baird Cancer, PA-C on 06/25/2015    INTERVAL HISTORY:  Brittney [name] 48 y.o. female returns for routine follow-up of breast cancer and iron deficiency anemia. She is here today by herself. She states that she noticed a pulling/cramping sensation in her right breast. Denies any nausea, vomiting, or diarrhea. Denies any new pains. Had not noticed any recent bleeding such as epistaxis, hematuria or hematochezia. Denies recent chest pain on exertion, shortness of breath on minimal exertion, pre-syncopal episodes, or palpitations. Denies any numbness or tingling in hands or feet. Denies any recent fevers, infections, or recent hospitalizations. Patient reports appetite at 100% and energy level at 50%.    REVIEW OF SYSTEMS:  Review of Systems  Constitutional: Positive for fatigue.  Neurological: Positive for headaches.  Psychiatric/Behavioral: Positive for depression and sleep disturbance.     PAST MEDICAL/SURGICAL HISTORY:  Past Medical History:  Diagnosis Date  . Breast cancer ([place])   . Breast cancer of upper-outer quadrant of right female breast ([place]) 06/11/2015   Triple negative, 2 cm   . Chemotherapy induced neutropenia ([place]) 09/10/2015  . Diabetes mellitus ([place])    Type 1 over 15 yrs  . Environmental allergies   . GERD (gastroesophageal reflux disease)   . Iron deficiency anemia due to chronic blood loss 06/12/2015  . Personal history of chemotherapy   . Personal history of radiation  therapy   . 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 BREAST LUMPECTOMY Right 2017  . CESAREAN SECTION    . COLONOSCOPY N/A 10/09/2013   Procedure: COLONOSCOPY;  Surgeon: Rogene Houston, MD;  Location: [facility] ENDO SU[facility];  Service: Endoscopy;  Laterality: N/A;  100  . DILATION AND CURETTAGE OF UTERUS     x 2 for miscarriages  . ESOPHAGOGASTRODUODENOSCOPY    . FLEXIBLE SIGMOIDOSCOPY    . FLEXIBLE SIGMOIDOSCOPY  07/27/2011   Procedure:  FLEXIBLE SIGMOIDOSCOPY;  Surgeon: Rogene Houston, MD;  Location: [facility] ENDO SUITE;  Service: Endoscopy;  Laterality: N/A;  100     SOCIAL HISTORY:  Social History   Socioeconomic History  . Marital status: Single    Spouse name: Not on file  . Number of children: Not on file  . Years of education: Not on file  . Highest education level: Not on file  Occupational History  . Not on file  Social Needs  . Financial resource strain: Not on file  . Food insecurity:    Worry: Not on file    Inability: Not on file  . Transportation needs:    Medical: Not on file    Non-medical: Not on file  Tobacco Use  . Smoking status: Never Smoker  . Smokeless tobacco: Never Used  Substance and Sexual Activity  . Alcohol use: No    Alcohol/week: 0.0 standard drinks  . Drug use: No  . Sexual activity: Not on file  Lifestyle  . Physical activity:    Days per week: Not on file    Minutes per session: Not on file  . Stress: Not on file  Relationships  . Social connections:    Talks on phone: Not on file    Gets together: Not on file    Attends religious service: Not on file    Active member of club or organization: Not on file    Attends meetings of clubs or organizations: Not on file    Relationship status: Not on file  . Intimate partner violence:    Fear of current or ex partner: Not on file    Emotionally abused: Not on file    Physically abused: Not on file    Forced sexual activity: Not on file  Other Topics Concern  . Not on file  Social History Narrative  . Not on file    FAMILY HISTORY:  Family History  Problem Relation Age of Onset  . Non-Hodgkin's lymphoma Mother   . Hypertension Mother   . Diabetes Mellitus II Mother   . Hypertension Father   . Diabetes Mellitus II Father   . Colon cancer Cousin   . Breast cancer Maternal Aunt     CURRENT MEDICATIONS:  Outpatient Encounter Medications as of 03/19/2018  Medication Sig  . montelukast (SINGULAIR) 10 MG tablet Take 10  mg by mouth at bedtime.  . Olopatadine HCl (PATADAY) 0.2 % SOLN Apply 1 drop to eye daily. Both eyes  . acetaminophen (TYLENOL) 500 MG tablet Take 1,000 mg by mouth every 6 (six) hours as needed. For pain  . acyclovir (ZOVIRAX) 200 MG capsule Take 200 mg by mouth 2 (two) times daily. Reported on 07/23/2015  . albuterol (PROVENTIL HFA;VENTOLIN HFA) 108 (90 Base) MCG/ACT inhaler Inhale 2 puffs into the lungs every 4 (four) hours.   [name] atorvastatin (LIPITOR) 20 MG tablet Take 20 mg by mouth daily.  [name]  Copper (PARAGARD) IUD IUD by Intrauterine route.  . gabapentin (NEURONTIN) 300 MG capsule Take 600 mg by mouth at bedtime. 300 mg in am and 600 mg at bedtime.  [name] HUMALOG 100 UNIT/ML injection 4 Units 3 (three) times daily with meals.   [name] HYDROcodone-acetaminophen (NORCO/VICODIN) 5-325 MG tablet TAKE ONE TABLET BY MOUTH EVERY 4 HOURS AS NEEDED FOR MODERATE PAIN  . hydrOXYzine (ATARAX/VISTARIL) 25 MG tablet Take 25 mg by mouth as needed.   [name] LANTUS SOLOSTAR 100 UNIT/ML Solostar Pen Inject 18 Units into the skin at bedtime.   [name] loratadine (CLARITIN) 10 MG tablet TAKE ONE TABLET BY MOUTH DAILY FOR HIVES.  [name] mesalamine (APRISO) 0.375 g 24 hr capsule Take 4 capsules (1.5 g total) by mouth daily.  . mometasone (NASONEX) 50 MCG/ACT nasal spray Place 2 sprays into the nose daily as needed.   [name] omeprazole (PRILOSEC) 20 MG capsule Take 40 mg by mouth 2 (two) times daily before a meal. Pt takes 20 mg before breakfast and 20 mg before dinner  . ondansetron (ZOFRAN ODT) 8 MG disintegrating tablet Take 1 tablet (8 mg total) by mouth every 8 (eight) hours as needed for nausea or vomiting.  . polyethylene glycol powder (GLYCOLAX/MIRALAX) powder Take 8.5 g by mouth daily.  . promethazine (PHENERGAN) 25 MG tablet Take 1 tablet (25 mg total) by mouth every 6 (six) hours as needed for nausea or vomiting.  . [DISCONTINUED] baclofen (LIORESAL) 10 MG tablet TAKE 1/2 TO 1 TABLET BY MOUTH TWICE DAILY AS NEEDED FOR MUSCLE SPASMS MAY  CAUSE DROWSINESS  . [DISCONTINUED] famotidine (PEPCID) 20 MG tablet Take 1 tablet (20 mg total) by mouth at bedtime as needed for heartburn or indigestion. (Patient not taking: Reported on 03/19/2018)  . [DISCONTINUED] insulin lispro protamine-insulin lispro (HUMALOG 50/50) (50-50) 100 UNIT/ML SUSP Inject 4 Units into the skin 3 (three) times daily. With each meal  . [DISCONTINUED] LORazepam (ATIVAN) 0.5 MG tablet Take 1 tablet (0.5 mg total) by mouth every 8 (eight) hours. For nausea or anxiety (Patient not taking: Reported on 03/19/2018)  . [DISCONTINUED] predniSONE (DELTASONE) 50 MG tablet Take by mouth.   Facility-Administered Encounter Medications as of 03/19/2018  Medication  . 0.9 %  sodium chloride infusion  . 0.9 %  sodium chloride infusion    ALLERGIES:  Allergies  Allergen Reactions  . Ace Inhibitors Itching  . Peanut Oil Itching  . Shellfish Allergy Itching and Rash  . Statins Itching  . Adhesive [Tape] Rash  . Pravastatin Sodium Itching     PHYSICAL EXAM:  ECOG Performance status: 1  Vitals:   03/19/18 0825  BP: 135/90  Pulse: 66  Resp: 14  Temp: 98.3 F (36.8 C)  SpO2: 100%   Filed Weights   03/19/18 0825  Weight: 192 lb (87.1 kg)    Physical Exam Constitutional:      Appearance: Normal appearance.  Cardiovascular:     Rate and Rhythm: Normal rate and regular rhythm.  Pulmonary:     Effort: Pulmonary effort is normal. No respiratory distress.     Breath sounds: Normal breath sounds.  Abdominal:     General: Bowel sounds are normal. There is no distension.     Palpations: Abdomen is soft.  Genitourinary:    Comments: Bilateral breast exam did not show any masses.  Right lumpectomy site is within normal limits.  No palpable adenopathy. Musculoskeletal:        General: No swelling.  Skin:    General: Skin is  warm.  Neurological:     General: No focal deficit present.     Mental Status: She is alert and oriented to person, place, and time.   Psychiatric:        Mood and Affect: Mood normal.        Behavior: Behavior normal.      LABORATORY DATA:  I have reviewed the labs as listed.  CBC    Component Value Date/Time   WBC 3.6 (L) 03/12/2018 1109   RBC 4.33 03/12/2018 1109   HGB 12.1 03/12/2018 1109   HCT 39.3 03/12/2018 1109   PLT 211 03/12/2018 1109   MCV 90.8 03/12/2018 1109   MCH 27.9 03/12/2018 1109   MCHC 30.8 03/12/2018 1109   RDW 14.4 03/12/2018 1109   LYMPHSABS 1.3 03/12/2018 1109   MONOABS 0.3 03/12/2018 1109   EOSABS 0.0 03/12/2018 1109   BASOSABS 0.0 03/12/2018 1109   CMP Latest Ref Rng & Units 03/12/2018 11/08/2017 06/28/2017  Glucose 70 - 99 mg/dL 297(H) 94 146(H)  BUN 6 - 20 mg/dL _0 Creatinine 0.44 - 1.00 mg/dL 0.47 0.43(L) 0.53  Sodium 135 - 145 mmol/L 140 139 140  Potassium 3.5 - 5.1 mmol/L 3.9 3.6 4.2  Chloride 98 - 111 mmol/L 103 108 106  CO2 22 - 32 mmol/L _1 Calcium 8.9 - 10.3 mg/dL 9.0 8.7(L) 8.6(L)  Total Protein 6.5 - 8.1 g/dL 6.4(L) 6.4(L) 6.4(L)  Total Bilirubin 0.3 - 1.2 mg/dL 0.8 0.9 0.5  Alkaline Phos 38 - 126 U/L 80 67 84  AST 15 - 41 U/L _2 ALT 0 - 44 U/L _3 DIAGNOSTIC IMAGING:  I have independently reviewed the scans and discussed with the patient.   I have reviewed Venita Lick LPN's note and agree with the documentation.  I personally performed a face-to-face visit, made revisions and my assessment and plan is as follows.    ASSESSMENT & PLAN:   Malignant neoplasm of upper-outer quadrant of right female breast ([place]) 1.  Stage Ia (T1 cN0 M0) [place] BC of the right breast: -Status post lumpectomy and sentinel lymph node biopsy by Dr. Anthony Sar on 05/13/2015 -Status post adjuvant chemotherapy with dose dense AC followed by 3 cycles of dose dense docetaxel from 06/25/2015 through 11/12/2015. -Last mammogram on 05/09/2017 was BI-RADS Category 2. - Physical examination today was within normal limits.  Labs were within normal limits.  She complains of  cramp in the right lateral chest wall which lasts few seconds on certain movements. - We will schedule her mammogram in May.  She will come back in 4 months for follow-up.  2.  Iron deficiency state: -This is from chronic blood loss from ulcerative colitis. -She received Feraheme on 11/20/2017 and 11/27/2017.  Ferritin has improved to 186 from 37.  3.  Ulcerative colitis: -She denies any bleeding in the interim 4 months. -This is stable for the past 21 years.  Last colonoscopy was in 2015.  She will continue mesalamine.      Orders placed this encounter:  Orders Placed This Encounter  Procedures  . MM DIAG BREAST TOMO BILATERAL  . CBC with Differential/Platelet  . Comprehensive metabolic panel  . Iron and TIBC  . Ferritin  . Vitamin B12  . Folate      Derek Jack, MD [place] [phone]

## 2018-03-19 NOTE — Assessment & Plan Note (Signed)
1.  Stage Ia (T1 cN0 M0) [place] BC of the right breast: -Status post lumpectomy and sentinel lymph node biopsy by Dr. Anthony Sar on 05/13/2015 -Status post adjuvant chemotherapy with dose dense AC followed by 3 cycles of dose dense docetaxel from 06/25/2015 through 11/12/2015. -Last mammogram on 05/09/2017 was BI-RADS Category 2. - Physical examination today was within normal limits.  Labs were within normal limits.  She complains of cramp in the right lateral chest wall which lasts few seconds on certain movements. - We will schedule her mammogram in May.  She will come back in 4 months for follow-up.  2.  Iron deficiency state: -This is from chronic blood loss from ulcerative colitis. -She received Feraheme on 11/20/2017 and 11/27/2017.  Ferritin has improved to 186 from 37.  3.  Ulcerative colitis: -She denies any bleeding in the interim 4 months. -This is stable for the past 21 years.  Last colonoscopy was in 2015.  She will continue mesalamine.

## 2018-03-19 NOTE — Patient Instructions (Addendum)
[facility] at [facility] Discharge Instructions  You were seen today by Dr. Katragadda. He went over your recent lab results. He will see you back in 4 months for labs and follow up.   Thank you for choosing [facility] at [facility] to provide your oncology and hematology care.  To afford each patient quality time with our provider, please arrive at least 15 minutes before your scheduled appointment time.   If you have a lab appointment with the Cancer Center please come in thru the  Main Entrance and check in at the main information desk  You need to re-schedule your appointment should you arrive 10 or more minutes late.  We strive to give you quality time with our providers, and arriving late affects you and other patients whose appointments are after yours.  Also, if you no show three or more times for appointments you may be dismissed from the clinic at the providers discretion.     Again, thank you for choosing [facility].  Our hope is that these requests will decrease the amount of time that you wait before being seen by our physicians.       _____________________________________________________________  Should you have questions after your visit to [facility], please contact our office at [phone] between the hours of 8:00 a.m. and 4:30 p.m.  Voicemails left after 4:00 p.m. will not be returned until the following business day.  For prescription refill requests, have your pharmacy contact our office and allow 72 hours.    Cancer Center Support Programs:   > Cancer Support Group  2nd Tuesday of the month 1pm-2pm, Journey Room    

## 2018-03-21 ENCOUNTER — Encounter: Payer: Self-pay | Admitting: "Endocrinology

## 2018-03-21 ENCOUNTER — Ambulatory Visit (INDEPENDENT_AMBULATORY_CARE_PROVIDER_SITE_OTHER): Payer: Medicaid Other | Admitting: "Endocrinology

## 2018-03-21 ENCOUNTER — Other Ambulatory Visit: Payer: Self-pay

## 2018-03-21 VITALS — BP 132/84 | HR 68 | Ht 61.0 in | Wt 187.0 lb

## 2018-03-21 DIAGNOSIS — E1065 Type 1 diabetes mellitus with hyperglycemia: Secondary | ICD-10-CM | POA: Diagnosis not present

## 2018-03-21 DIAGNOSIS — E1165 Type 2 diabetes mellitus with hyperglycemia: Secondary | ICD-10-CM | POA: Insufficient documentation

## 2018-03-21 DIAGNOSIS — E782 Mixed hyperlipidemia: Secondary | ICD-10-CM

## 2018-03-21 NOTE — Patient Instructions (Signed)

## 2018-03-21 NOTE — Progress Notes (Signed)
Endocrinology Consult Note       03/21/2018, 9:59 AM   Subjective:    Patient ID: Brittney [name], female    DOB: 19-Jul-1970.  Brittney [name] is being seen in consultation for management of currently uncontrolled symptomatic diabetes requested by  [name], MD.   Past Medical History:  Diagnosis Date  . Breast cancer ([place])   . Breast cancer of upper-outer quadrant of right female breast ([place]) 06/11/2015   Triple negative, 2 cm   . Chemotherapy induced neutropenia ([place]) 09/10/2015  . Diabetes mellitus ([place])    Type 1 over 15 yrs  . Environmental allergies   . GERD (gastroesophageal reflux disease)   . Iron deficiency anemia due to chronic blood loss 06/12/2015  . Personal history of chemotherapy   . Personal history of radiation therapy   . 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 BREAST LUMPECTOMY Right 2017  . CESAREAN SECTION    . COLONOSCOPY N/A 10/09/2013   Procedure: COLONOSCOPY;  Surgeon: Rogene Houston, MD;  Location: [facility];  Service: Endoscopy;  Laterality: N/A;  100  . DILATION AND CURETTAGE OF UTERUS     x 2 for miscarriages  . ESOPHAGOGASTRODUODENOSCOPY    . FLEXIBLE SIGMOIDOSCOPY    . FLEXIBLE SIGMOIDOSCOPY  07/27/2011   Procedure: FLEXIBLE SIGMOIDOSCOPY;  Surgeon: Rogene Houston, MD;  Location: [facility];  Service: Endoscopy;  Laterality: N/A;  100    Social History   Socioeconomic History  . Marital status: Single    Spouse name: Not on file  . Number of children: Not on file  . Years of education: Not on file  . Highest education level: Not on file  Occupational History  . Not on file  Social Needs  . Financial resource strain: Not on file  . Food insecurity:    Worry: Not on file    Inability: Not on file  . Transportation needs:    Medical: Not on file    Non-medical: Not on file   Tobacco Use  . Smoking status: Never Smoker  . Smokeless tobacco: Never Used  Substance and Sexual Activity  . Alcohol use: No    Alcohol/week: 0.0 standard drinks  . Drug use: No  . Sexual activity: Not on file  Lifestyle  . Physical activity:    Days per week: Not on file    Minutes per session: Not on file  . Stress: Not on file  Relationships  . Social connections:    Talks on phone: Not on file    Gets together: Not on file    Attends religious service: Not on file    Active member of club or organization: Not on file    Attends meetings of clubs or organizations: Not on file    Relationship status: Not on file  Other Topics Concern  . Not on file  Social History Narrative  . Not on file    Family History  Problem Relation Age of Onset  . Non-Hodgkin's lymphoma Mother   . Hypertension  Mother   . Diabetes Mellitus II Mother   . Hypertension Father   . Diabetes Mellitus II Father   . Colon cancer Cousin   . Breast cancer Maternal Aunt     Outpatient Encounter Medications as of 03/21/2018  Medication Sig  . acetaminophen (TYLENOL) 500 MG tablet Take 1,000 mg by mouth every 6 (six) hours as needed. For pain  . acyclovir (ZOVIRAX) 200 MG capsule Take 200 mg by mouth 2 (two) times daily. Reported on 07/23/2015  . albuterol (PROVENTIL HFA;VENTOLIN HFA) 108 (90 Base) MCG/ACT inhaler Inhale 2 puffs into the lungs every 4 (four) hours.   . fluticasone (FLOVENT DISKUS) 50 MCG/BLIST diskus inhaler Inhale 1 puff into the lungs 2 (two) times daily.  [name] gabapentin (NEURONTIN) 300 MG capsule Take 600 mg by mouth at bedtime. 300 mg in am and 600 mg at bedtime.  [name] HUMALOG 100 UNIT/ML injection Inject 5-11 Units into the skin 3 (three) times daily with meals.  [name] LANTUS SOLOSTAR 100 UNIT/ML Solostar Pen Inject 14 Units into the skin at bedtime.  [name] loratadine (CLARITIN) 10 MG tablet TAKE ONE TABLET BY MOUTH DAILY FOR HIVES.  [name] Olopatadine HCl (PATADAY) 0.2 % SOLN Apply 1 drop to eye  daily. Both eyes  . omeprazole (PRILOSEC) 20 MG capsule Take 40 mg by mouth 2 (two) times daily before a meal. Pt takes 20 mg before breakfast and 20 mg before dinner  . [DISCONTINUED] atorvastatin (LIPITOR) 20 MG tablet Take 20 mg by mouth daily.  . [DISCONTINUED] Copper (PARAGARD) IUD IUD by Intrauterine route.  . [DISCONTINUED] HYDROcodone-acetaminophen (NORCO/VICODIN) 5-325 MG tablet TAKE ONE TABLET BY MOUTH EVERY 4 HOURS AS NEEDED FOR MODERATE PAIN (Patient not taking: Reported on 03/21/2018)  . [DISCONTINUED] hydrOXYzine (ATARAX/VISTARIL) 25 MG tablet Take 25 mg by mouth as needed.   . [DISCONTINUED] mesalamine (APRISO) 0.375 g 24 hr capsule Take 4 capsules (1.5 g total) by mouth daily. (Patient not taking: Reported on 03/21/2018)  . [DISCONTINUED] mometasone (NASONEX) 50 MCG/ACT nasal spray Place 2 sprays into the nose daily as needed.   . [DISCONTINUED] montelukast (SINGULAIR) 10 MG tablet Take 10 mg by mouth at bedtime.  . [DISCONTINUED] ondansetron (ZOFRAN ODT) 8 MG disintegrating tablet Take 1 tablet (8 mg total) by mouth every 8 (eight) hours as needed for nausea or vomiting. (Patient not taking: Reported on 03/21/2018)  . [DISCONTINUED] polyethylene glycol powder (GLYCOLAX/MIRALAX) powder Take 8.5 g by mouth daily. (Patient not taking: Reported on 03/21/2018)  . [DISCONTINUED] promethazine (PHENERGAN) 25 MG tablet Take 1 tablet (25 mg total) by mouth every 6 (six) hours as needed for nausea or vomiting. (Patient not taking: Reported on 03/21/2018)   Facility-Administered Encounter Medications as of 03/21/2018  Medication  . 0.9 %  sodium chloride infusion  . 0.9 %  sodium chloride infusion    ALLERGIES: Allergies  Allergen Reactions  . Ace Inhibitors Itching  . Peanut Oil Itching  . Shellfish Allergy Itching and Rash  . Statins Itching  . Adhesive [Tape] Rash  . Pravastatin Sodium Itching    VACCINATION STATUS: Immunization History  Administered Date(s) Administered  .  Influenza,inj,Quad PF,6+ Mos 10/22/2015  . Pneumococcal Polysaccharide-23 01/03/2006  . Td 10/04/2006    Diabetes  She presents for her initial diabetic visit. She has type 1 diabetes mellitus. Onset time: She was diagnosed at approximate age of 18 years. Her disease course has been fluctuating. Hypoglycemia symptoms include headaches and nervousness/anxiousness. Pertinent negatives for hypoglycemia include no confusion, pallor or seizures.  Associated symptoms include fatigue, polydipsia and polyuria. Pertinent negatives for diabetes include no chest pain and no polyphagia. There are no hypoglycemic complications. Symptoms are worsening. There are no diabetic complications. Risk factors for coronary artery disease include diabetes mellitus, dyslipidemia, obesity and sedentary lifestyle. Current diabetic treatment includes insulin injections. Her weight is fluctuating minimally. She is following a generally unhealthy diet. When asked about meal planning, she reported none. She has not had a previous visit with a dietitian. Her home blood glucose trend is fluctuating minimally. (She wears a Dexcom CGM which shows 51% time in range, 37% above range, 11% below range.  most recent A1c was 7.7% on January 30, 2018.) An ACE inhibitor/angiotensin II receptor blocker is not being taken.  Hyperlipidemia  This is a chronic problem. The current episode started more than 1 year ago. The problem is controlled. Pertinent negatives include no chest pain, myalgias or shortness of breath. She is currently on no antihyperlipidemic treatment (She was treated with atorvastatin in the past, did not continue on this medication complaining about cost.). Risk factors for coronary artery disease include diabetes mellitus, dyslipidemia, obesity and family history.     Review of Systems  Constitutional: Positive for fatigue. Negative for chills, fever and unexpected weight change.  HENT: Negative for trouble swallowing and voice  change.   Eyes: Negative for visual disturbance.  Respiratory: Negative for cough, shortness of breath and wheezing.   Cardiovascular: Negative for chest pain, palpitations and leg swelling.  Gastrointestinal: Negative for diarrhea, nausea and vomiting.  Endocrine: Positive for polydipsia and polyuria. Negative for cold intolerance, heat intolerance and polyphagia.  Musculoskeletal: Negative for arthralgias and myalgias.  Skin: Negative for color change, pallor, rash and wound.  Neurological: Positive for headaches. Negative for seizures.  Psychiatric/Behavioral: Negative for confusion and suicidal ideas. The patient is nervous/anxious.     Objective:    BP 132/84   Pulse 68   Ht 5' 1"  (1.549 m)   Wt 187 lb (84.8 kg)   LMP 09/04/2015 (Approximate)   BMI 35.33 kg/m   Wt Readings from Last 3 Encounters:  03/21/18 187 lb (84.8 kg)  03/19/18 192 lb (87.1 kg)  09/19/17 179 lb (81.2 kg)     Physical Exam Constitutional:      Appearance: She is well-developed.  HENT:     Head: Normocephalic and atraumatic.     Nose: Nose normal.  Neck:     Musculoskeletal: Normal range of motion and neck supple.     Thyroid: No thyromegaly.     Trachea: No tracheal deviation.  Abdominal:     Tenderness: There is no abdominal tenderness. There is no guarding.  Musculoskeletal: Normal range of motion.  Skin:    General: Skin is warm and dry.     Coloration: Skin is not pale.     Findings: No erythema or rash.  Neurological:     Mental Status: She is alert and oriented to person, place, and time.     Cranial Nerves: No cranial nerve deficit.     Coordination: Coordination normal.     Deep Tendon Reflexes: Reflexes are normal and symmetric.  Psychiatric:        Judgment: Judgment normal.       CMP ( most recent) CMP     Component Value Date/Time   NA 140 03/12/2018 1109   K 3.9 03/12/2018 1109   CL 103 03/12/2018 1109   CO2 30 03/12/2018 1109   GLUCOSE 297 (H) 03/12/2018 1109  BUN 8 03/12/2018 1109   BUN 7 01/30/2018   CREATININE 0.47 03/12/2018 1109   CALCIUM 9.0 03/12/2018 1109   PROT 6.4 (L) 03/12/2018 1109   ALBUMIN 3.8 03/12/2018 1109   AST 19 03/12/2018 1109   ALT 15 03/12/2018 1109   ALKPHOS 80 03/12/2018 1109   BILITOT 0.8 03/12/2018 1109   GFRNONAA >60 03/12/2018 1109   GFRAA >60 03/12/2018 1109     Diabetic Labs (most recent): Lab Results  Component Value Date   HGBA1C 7.7 01/30/2018   HGBA1C 7.4 (H) 02/27/2009   HGBA1C 8.3 (H) 12/16/2008     Lipid Panel ( most recent) Lipid Panel     Component Value Date/Time   CHOL 154 01/30/2018   TRIG 59 01/30/2018   HDL 66 01/30/2018   LDLCALC 76 01/30/2018      Assessment & Plan:   1. Uncontrolled type 1 diabetes mellitus with hyperglycemia (HCC)  - Brittney [name] has currently uncontrolled symptomatic type 2 DM since 48 years of age,  with most recent A1c of 7.7 %. Recent labs reviewed, as well as her Dexcom CGM printout showing 51% time in range, 37% above range, 11% below range.  She is concerned about her rare and random hypoglycemic episodes. - I had a long discussion with her about the progressive nature of diabetes and the pathology behind its complications. -her diabetes is complicated by obesity/sedentary life and she remains at a high risk for more acute and chronic complications which include CAD, CVA, CKD, retinopathy, and neuropathy. These are all discussed in detail with her.  - I have counseled her on diet management and weight loss, by adopting a carbohydrate restricted/protein rich diet. - she admits that there is a room for improvement in her food and drink choices. - Suggestion is made for her to avoid simple carbohydrates  from her diet including Cakes, Sweet Desserts, Ice Cream, Soda (diet and regular), Sweet Tea, Candies, Chips, Cookies, Store Bought Juices, Alcohol in Excess of  1-2 drinks a day, Artificial Sweeteners,  Coffee Creamer, and "Sugar-free" Products. This  will help patient to have more stable blood glucose profile and potentially avoid unintended weight gain.  - I encouraged her to switch to  unprocessed or minimally processed complex starch and increased protein intake (animal or plant source), fruits, and vegetables.  - she is advised to stick to a routine mealtimes to eat 3 meals  a day and avoid unnecessary snacks ( to snack only to correct hypoglycemia).   - she will be scheduled with Jearld Fenton, RDN, CDE for individualized diabetes education.  - I have approached her with the following individualized plan to manage diabetes and patient agrees:   -She will continue to need intensive treatment with basal/bolus insulin in order for her to achieve and maintain control of diabetes to target. -I discussed and adjusted her basal insulin Lantus to 14 units nightly, readjusted her prandial insulin Humalog to 5 units 3 times a day with meals  for pre-meal BG readings of 90-131m/dl, plus patient specific correction dose for unexpected hyperglycemia above 1575mdl, associated with strict monitoring of glucose 4 times a day-before meals and at bedtime. - she is warned not to take insulin without proper monitoring per orders. - Adjustment parameters are given to her for hypo and hyperglycemia in writing. - she is encouraged to call clinic for blood glucose levels less than 70 or above 300 mg /dl. -She will be considered for anti-GAD and anti-islet cell antibodies to classify  her diabetes properly. - she is not a candidate for metformin, SGLT2 inhibitors, nor incretin therapy.   - Patient specific target  A1c;  LDL, HDL, Triglycerides, and  Waist Circumference were discussed in detail.  2) Blood Pressure /Hypertension:  her blood pressure is controlled to target.  She is not on anti-hypertensive medications for now.  3) Lipids/Hyperlipidemia:   Review of her recent lipid panel showed  controlled  LDL at 76.  she was given prescription for  atorvastatin in the past, however did not continue due to cost .  4)  Weight/Diet:  Body mass index is 35.33 kg/m.  -   clearly complicating her diabetes care.  I discussed with her the fact that loss of 5 - 10% of her  current body weight will have the most impact on her diabetes management.  CDE Consult will be initiated . Exercise, and detailed carbohydrates information provided  -  detailed on discharge instructions.  5) Chronic Care/Health Maintenance:  -she  Is not on ACEI/ARB and Statin medications and  is encouraged to initiate and continue to follow up with Ophthalmology, Dentist,  Podiatrist at least yearly or according to recommendations, and advised to   stay away from smoking. I have recommended yearly flu vaccine and pneumonia vaccine at least every 5 years; moderate intensity exercise for up to 150 minutes weekly; and  sleep for at least 7 hours a day.  - she is  advised to maintain close follow up with [name], MD for primary care needs, as well as her other providers for optimal and coordinated care.  - Time spent with the patient: 45 minutes, of which >50% was spent in obtaining information about her symptoms, reviewing her previous labs/studies, evaluations, and treatments, counseling her about her currently uncontrolled type 1 diabetes, hyperlipidemia, and developing plans for long term treatment based on the latest standards of care/guidelines.  Please refer to " Patient Self Inventory" in the Media  tab for reviewed elements of pertinent patient history.  Brittney [name] participated in the discussions, expressed understanding, and voiced agreement with the above plans.  All questions were answered to her satisfaction. she is encouraged to contact clinic should she have any questions or concerns prior to her return visit.  Follow up plan: - Return in about 9 weeks (around 05/23/2018) for Follow up with Pre-visit Labs, Meter, and Logs.  Glade Lloyd, MD [facility] [address] [place], [place] [address] Phone: [phone]  Fax: [phone]    03/21/2018, 9:59 AM  This note was partially dictated with voice recognition software. Similar sounding words can be transcribed inadequately or may not  be corrected upon review.

## 2018-05-21 ENCOUNTER — Other Ambulatory Visit (HOSPITAL_COMMUNITY): Payer: Self-pay | Admitting: Hematology

## 2018-05-21 DIAGNOSIS — C50411 Malignant neoplasm of upper-outer quadrant of right female breast: Secondary | ICD-10-CM

## 2018-05-24 ENCOUNTER — Ambulatory Visit: Payer: Medicaid Other | Admitting: "Endocrinology

## 2018-05-29 ENCOUNTER — Other Ambulatory Visit: Payer: Self-pay

## 2018-05-29 ENCOUNTER — Ambulatory Visit (HOSPITAL_COMMUNITY): Admission: RE | Admit: 2018-05-29 | Payer: Medicaid Other | Source: Ambulatory Visit

## 2018-05-29 ENCOUNTER — Ambulatory Visit (HOSPITAL_COMMUNITY)
Admission: RE | Admit: 2018-05-29 | Discharge: 2018-05-29 | Disposition: A | Discharge status: Home / Self Care | Payer: Medicaid Other | Source: Ambulatory Visit | Attending: Hematology | Admitting: Hematology

## 2018-05-29 DIAGNOSIS — Z171 Estrogen receptor negative status [ER-]: Secondary | ICD-10-CM | POA: Diagnosis present

## 2018-05-29 DIAGNOSIS — C50411 Malignant neoplasm of upper-outer quadrant of right female breast: Secondary | ICD-10-CM | POA: Diagnosis present

## 2018-05-31 LAB — TSH: TSH: 1.39 mIU/L

## 2018-05-31 LAB — COMPLETE METABOLIC PANEL WITH GFR
AG Ratio: 2.2 (calc) (ref 1.0–2.5)
ALT: 21 U/L (ref 6–29)
AST: 25 U/L (ref 10–35)
Albumin: 4.3 g/dL (ref 3.6–5.1)
Alkaline phosphatase (APISO): 94 U/L (ref 31–125)
BUN: 11 mg/dL (ref 7–25)
CO2: 31 mmol/L (ref 20–32)
Calcium: 9.2 mg/dL (ref 8.6–10.2)
Chloride: 101 mmol/L (ref 98–110)
Creat: 0.66 mg/dL (ref 0.50–1.10)
GFR, Est African American: 123 mL/min/{1.73_m2} (ref >=60)
GFR, Est Non African American: 106 mL/min/{1.73_m2} (ref >=60)
Globulin: 2 g/dL (calc) (ref 1.9–3.7)
Glucose, Bld: 383 mg/dL — ABNORMAL HIGH (ref 65–99)
Potassium: 4.2 mmol/L (ref 3.5–5.3)
Sodium: 140 mmol/L (ref 135–146)
Total Bilirubin: 0.6 mg/dL (ref 0.2–1.2)
Total Protein: 6.3 g/dL (ref 6.1–8.1)

## 2018-05-31 LAB — T4, FREE: FREE T4: 1 ng/dL (ref 0.8–1.8)

## 2018-05-31 LAB — MICROALBUMIN / CREATININE URINE RATIO
CREATININE, URINE: 73 mg/dL (ref 20–275)
MICROALB UR: 2.3 mg/dL
Microalb Creat Ratio: 32 mcg/mg creat — ABNORMAL HIGH (ref <30)

## 2018-05-31 LAB — ISLET CELL AB SCREEN RFLX TO TITER: ISLET CELL ANTIBODY SCREEN: NEGATIVE

## 2018-05-31 LAB — VITAMIN D 25 HYDROXY (VIT D DEFICIENCY, FRACTURES): Vit D, 25-Hydroxy: 12 ng/mL — ABNORMAL LOW (ref 30–100)

## 2018-05-31 LAB — GLUTAMIC ACID DECARBOXYLASE AUTO ABS: Glutamic Acid Decarb Ab: 61 IU/mL — ABNORMAL HIGH (ref <5)

## 2018-05-31 LAB — HEMOGLOBIN A1C
Hgb A1c MFr Bld: 9 % of total Hgb — ABNORMAL HIGH (ref <5.7)
Mean Plasma Glucose: 212 (calc)
eAG (mmol/L): 11.7 (calc)

## 2018-06-04 ENCOUNTER — Other Ambulatory Visit: Payer: Self-pay

## 2018-06-04 ENCOUNTER — Ambulatory Visit (INDEPENDENT_AMBULATORY_CARE_PROVIDER_SITE_OTHER): Payer: Medicaid Other | Admitting: "Endocrinology

## 2018-06-04 ENCOUNTER — Encounter: Payer: Self-pay | Admitting: "Endocrinology

## 2018-06-04 VITALS — Wt 184.0 lb

## 2018-06-04 DIAGNOSIS — E782 Mixed hyperlipidemia: Secondary | ICD-10-CM | POA: Diagnosis not present

## 2018-06-04 DIAGNOSIS — E1065 Type 1 diabetes mellitus with hyperglycemia: Secondary | ICD-10-CM

## 2018-06-04 NOTE — Patient Instructions (Signed)

## 2018-06-04 NOTE — Progress Notes (Signed)
Endocrinology follow-up note       06/04/2018, 3:27 PM   Subjective:    Patient ID: Brittney [name], female    DOB: 1970-11-06.  Brittney [name] is being seen in follow-up for management of currently uncontrolled symptomatic diabetes requested by  [name], MD.   Past Medical History:  Diagnosis Date  . Breast cancer ([place])   . Breast cancer of upper-outer quadrant of right female breast ([place]) 06/11/2015   Triple negative, 2 cm   . Chemotherapy induced neutropenia ([place]) 09/10/2015  . Diabetes mellitus ([place])    Type 1 over 15 yrs  . Environmental allergies   . GERD (gastroesophageal reflux disease)   . Iron deficiency anemia due to chronic blood loss 06/12/2015  . Personal history of chemotherapy   . Personal history of radiation therapy   . 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)     Past Surgical History:  Procedure Laterality Date  . BREAST LUMPECTOMY Right 2017  . CESAREAN SECTION    . COLONOSCOPY N/A 10/09/2013   Procedure: COLONOSCOPY;  Surgeon: Rogene Houston, MD;  Location: [facility];  Service: Endoscopy;  Laterality: N/A;  100  . DILATION AND CURETTAGE OF UTERUS     x 2 for miscarriages  . ESOPHAGOGASTRODUODENOSCOPY    . FLEXIBLE SIGMOIDOSCOPY    . FLEXIBLE SIGMOIDOSCOPY  07/27/2011   Procedure: FLEXIBLE SIGMOIDOSCOPY;  Surgeon: Rogene Houston, MD;  Location: [facility];  Service: Endoscopy;  Laterality: N/A;  100    Social History   Socioeconomic History  . Marital status: Single    Spouse name: Not on file  . Number of children: Not on file  . Years of education: Not on file  . Highest education level: Not on file  Occupational History  . Not on file  Social Needs  . Financial resource strain: Not on file  . Food insecurity:    Worry: Not on file    Inability: Not on file  . Transportation needs:    Medical: Not on file    Non-medical: Not on file   Tobacco Use  . Smoking status: Never Smoker  . Smokeless tobacco: Never Used  Substance and Sexual Activity  . Alcohol use: No    Alcohol/week: 0.0 standard drinks  . Drug use: No  . Sexual activity: Not on file  Lifestyle  . Physical activity:    Days per week: Not on file    Minutes per session: Not on file  . Stress: Not on file  Relationships  . Social connections:    Talks on phone: Not on file    Gets together: Not on file    Attends religious service: Not on file    Active member of club or organization: Not on file    Attends meetings of clubs or organizations: Not on file    Relationship status: Not on file  Other Topics Concern  . Not on file  Social History Narrative  . Not on file    Family History  Problem Relation Age of Onset  . Non-Hodgkin's lymphoma Mother   . Hypertension  Mother   . Diabetes Mellitus II Mother   . Hypertension Father   . Diabetes Mellitus II Father   . Colon cancer Cousin   . Breast cancer Maternal Aunt     Outpatient Encounter Medications as of 06/04/2018  Medication Sig  . acetaminophen (TYLENOL) 500 MG tablet Take 1,000 mg by mouth every 6 (six) hours as needed. For pain  . acyclovir (ZOVIRAX) 200 MG capsule Take 200 mg by mouth 2 (two) times daily. Reported on 07/23/2015  . albuterol (PROVENTIL HFA;VENTOLIN HFA) 108 (90 Base) MCG/ACT inhaler Inhale 2 puffs into the lungs every 4 (four) hours.   . APRISO 0.375 g 24 hr capsule 4 capsules daily.  [name] atorvastatin (LIPITOR) 20 MG tablet daily.  . fluticasone (FLOVENT DISKUS) 50 MCG/BLIST diskus inhaler Inhale 1 puff into the lungs 2 (two) times daily.  [name] gabapentin (NEURONTIN) 300 MG capsule Take 600 mg by mouth at bedtime. 300 mg in am and 600 mg at bedtime.  [name] HUMALOG 100 UNIT/ML injection Inject 8-11 Units into the skin 3 (three) times daily with meals.  [name] LANTUS SOLOSTAR 100 UNIT/ML Solostar Pen Inject 20 Units into the skin at bedtime.  [name] loratadine (CLARITIN) 10 MG tablet TAKE ONE  TABLET BY MOUTH DAILY FOR HIVES.  [name] montelukast (SINGULAIR) 10 MG tablet Take 10 mg by mouth every evening.  . Olopatadine HCl (PATADAY) 0.2 % SOLN Apply 1 drop to eye daily. Both eyes  . omeprazole (PRILOSEC) 20 MG capsule Take 40 mg by mouth 2 (two) times daily before a meal. Pt takes 20 mg before breakfast and 20 mg before dinner   Facility-Administered Encounter Medications as of 06/04/2018  Medication  . 0.9 %  sodium chloride infusion  . 0.9 %  sodium chloride infusion    ALLERGIES: Allergies  Allergen Reactions  . Ace Inhibitors Itching  . Peanut Oil Itching  . Shellfish Allergy Itching and Rash  . Statins Itching  . Adhesive [Tape] Rash  . Pravastatin Sodium Itching    VACCINATION STATUS: Immunization History  Administered Date(s) Administered  . Influenza,inj,Quad PF,6+ Mos 10/22/2015  . Pneumococcal Polysaccharide-23 01/03/2006  . Td 10/04/2006    Diabetes  She presents for her follow-up diabetic visit. She has type 1 diabetes mellitus. Onset time: She was diagnosed at approximate age of 8 years. Her disease course has been worsening. Pertinent negatives for hypoglycemia include no confusion, nervousness/anxiousness, pallor or seizures. Associated symptoms include fatigue, polydipsia and polyuria. Pertinent negatives for diabetes include no chest pain and no polyphagia. There are no hypoglycemic complications. Symptoms are worsening. There are no diabetic complications. Risk factors for coronary artery disease include diabetes mellitus, dyslipidemia, obesity and sedentary lifestyle. Current diabetic treatment includes insulin injections. Her weight is fluctuating minimally. She is following a generally unhealthy diet. When asked about meal planning, she reported none. She has not had a previous visit with a dietitian. Her home blood glucose trend is fluctuating minimally. Her breakfast blood glucose range is generally 180-200 mg/dl. Her lunch blood glucose range is generally  180-200 mg/dl. Her dinner blood glucose range is generally 180-200 mg/dl. Her bedtime blood glucose range is generally 180-200 mg/dl. Her overall blood glucose range is 180-200 mg/dl. An ACE inhibitor/angiotensin II receptor blocker is not being taken.  Hyperlipidemia  This is a chronic problem. The current episode started more than 1 year ago. The problem is controlled. Pertinent negatives include no chest pain, myalgias or shortness of breath. She is currently on no antihyperlipidemic treatment (She was  treated with atorvastatin in the past, did not continue on this medication complaining about cost.). Risk factors for coronary artery disease include diabetes mellitus, dyslipidemia, obesity and family history.     Review of Systems  Constitutional: Positive for fatigue. Negative for chills, fever and unexpected weight change.  HENT: Negative for trouble swallowing and voice change.   Eyes: Negative for visual disturbance.  Respiratory: Negative for cough, shortness of breath and wheezing.   Cardiovascular: Negative for chest pain, palpitations and leg swelling.  Gastrointestinal: Negative for diarrhea, nausea and vomiting.  Endocrine: Positive for polydipsia and polyuria. Negative for cold intolerance, heat intolerance and polyphagia.  Musculoskeletal: Negative for arthralgias and myalgias.  Skin: Negative for color change, pallor, rash and wound.  Neurological: Negative for seizures.  Psychiatric/Behavioral: Negative for confusion and suicidal ideas. The patient is not nervous/anxious.     Objective:    Wt 184 lb (83.5 kg)   LMP 09/04/2015 (Approximate)   BMI 34.77 kg/m   Wt Readings from Last 3 Encounters:  06/04/18 184 lb (83.5 kg)  03/21/18 187 lb (84.8 kg)  03/19/18 192 lb (87.1 kg)     Physical Exam Constitutional:      Appearance: She is well-developed.  HENT:     Head: Normocephalic and atraumatic.     Nose: Nose normal.  Neck:     Musculoskeletal: Normal range of  motion and neck supple.     Thyroid: No thyromegaly.     Trachea: No tracheal deviation.  Abdominal:     Tenderness: There is no abdominal tenderness. There is no guarding.  Musculoskeletal: Normal range of motion.  Skin:    General: Skin is warm and dry.     Coloration: Skin is not pale.     Findings: No erythema or rash.  Neurological:     Mental Status: She is alert and oriented to person, place, and time.     Cranial Nerves: No cranial nerve deficit.     Coordination: Coordination normal.     Deep Tendon Reflexes: Reflexes are normal and symmetric.      Recent Results (from the past 2160 hour(s))  Comprehensive metabolic panel     Status: Abnormal   Collection Time: 03/12/18 11:09 AM  Result Value Ref Range   Sodium 140 135 - 145 mmol/L   Potassium 3.9 3.5 - 5.1 mmol/L   Chloride 103 98 - 111 mmol/L   CO2 30 22 - 32 mmol/L   Glucose, Bld 297 (H) 70 - 99 mg/dL   BUN 8 6 - 20 mg/dL   Creatinine, Ser 0.47 0.44 - 1.00 mg/dL   Calcium 9.0 8.9 - 10.3 mg/dL   Total Protein 6.4 (L) 6.5 - 8.1 g/dL   Albumin 3.8 3.5 - 5.0 g/dL   AST 19 15 - 41 U/L   ALT 15 0 - 44 U/L   Alkaline Phosphatase 80 38 - 126 U/L   Total Bilirubin 0.8 0.3 - 1.2 mg/dL   GFR calc non Af Amer >60 >60 mL/min   GFR calc Af Amer >60 >60 mL/min   Anion gap 7 5 - 15    Comment: Performed at [facility], [address], [place], [place] [address]  CBC with Differential     Status: Abnormal   Collection Time: 03/12/18 11:09 AM  Result Value Ref Range   WBC 3.6 (L) 4.0 - 10.5 K/uL   RBC 4.33 3.87 - 5.11 MIL/uL   Hemoglobin 12.1 12.0 - 15.0 g/dL   HCT 39.3  36.0 - 46.0 %   MCV 90.8 80.0 - 100.0 fL   MCH 27.9 26.0 - 34.0 pg   MCHC 30.8 30.0 - 36.0 g/dL   RDW 14.4 11.5 - 15.5 %   Platelets 211 150 - 400 K/uL   nRBC 0.0 0.0 - 0.2 %   Neutrophils Relative % 56 %   Neutro Abs 2.0 1.7 - 7.7 K/uL   Lymphocytes Relative 36 %   Lymphs Abs 1.3 0.7 - 4.0 K/uL   Monocytes Relative 7 %   Monocytes Absolute 0.3 0.1  - 1.0 K/uL   Eosinophils Relative 0 %   Eosinophils Absolute 0.0 0.0 - 0.5 K/uL   Basophils Relative 0 %   Basophils Absolute 0.0 0.0 - 0.1 K/uL   Immature Granulocytes 1 %   Abs Immature Granulocytes 0.02 0.00 - 0.07 K/uL    Comment: Performed at [facility], [address], [place], [place] [address]  Ferritin     Status: None   Collection Time: 03/12/18 11:09 AM  Result Value Ref Range   Ferritin 186 11 - 307 ng/mL    Comment: Performed at [facility], [address], [place], [place] [address]  Iron and TIBC     Status: None   Collection Time: 03/12/18 11:09 AM  Result Value Ref Range   Iron 88 28 - 170 ug/dL   TIBC 322 250 - 450 ug/dL   Saturation Ratios 27 10.4 - 31.8 %   UIBC 234 ug/dL    Comment: Performed at [facility], [address], [place], [place] [address]  Vitamin B12     Status: None   Collection Time: 03/12/18 11:09 AM  Result Value Ref Range   Vitamin B-12 315 180 - 914 pg/mL    Comment: (NOTE) This assay is not validated for testing neonatal or myeloproliferative syndrome specimens for Vitamin B12 levels. Performed at [facility], [address], [place], [place] [address]   Folate     Status: None   Collection Time: 03/12/18 11:09 AM  Result Value Ref Range   Folate 9.9 >5.9 ng/mL    Comment: Performed at [facility], [address], [place],  [address]  Hemoglobin A1c     Status: Abnormal   Collection Time: 05/23/18  9:34 AM  Result Value Ref Range   Hgb A1c MFr Bld 9.0 (H) <5.7 % of total Hgb    Comment: For someone without known diabetes, a hemoglobin A1c value of 6.5% or greater indicates that they may have  diabetes and this should be confirmed with a follow-up  test. . For someone with known diabetes, a value <7% indicates  that their diabetes is well controlled and a value  greater than or equal to 7% indicates suboptimal  control. A1c targets should be individualized based on  duration of diabetes, age, comorbid conditions, and   other considerations. . Currently, no consensus exists regarding use of hemoglobin A1c for diagnosis of diabetes for children. .    Mean Plasma Glucose 212 (calc)   eAG (mmol/L) 11.7 (calc)  COMPLETE METABOLIC PANEL WITH GFR     Status: Abnormal   Collection Time: 05/23/18  9:34 AM  Result Value Ref Range   Glucose, Bld 383 (H) 65 - 99 mg/dL    Comment: .            Fasting reference interval . For someone without known diabetes, a glucose value >125 mg/dL indicates that they may have diabetes and this should be confirmed  with a follow-up test. .    BUN 11 7 - 25 mg/dL   Creat 0.66 0.50 - 1.10 mg/dL   GFR, Est Non African American 106 > OR = 60 mL/min/1.81m   GFR, Est African American 123 > OR = 60 mL/min/1.779m  BUN/Creatinine Ratio NOT APPLICABLE 6 - 22 (calc)   Sodium 140 135 - 146 mmol/L   Potassium 4.2 3.5 - 5.3 mmol/L   Chloride 101 98 - 110 mmol/L   CO2 31 20 - 32 mmol/L   Calcium 9.2 8.6 - 10.2 mg/dL   Total Protein 6.3 6.1 - 8.1 g/dL   Albumin 4.3 3.6 - 5.1 g/dL   Globulin 2.0 1.9 - 3.7 g/dL (calc)   AG Ratio 2.2 1.0 - 2.5 (calc)   Total Bilirubin 0.6 0.2 - 1.2 mg/dL   Alkaline phosphatase (APISO) 94 31 - 125 U/L   AST 25 10 - 35 U/L   ALT 21 6 - 29 U/L  TSH     Status: None   Collection Time: 05/23/18  9:34 AM  Result Value Ref Range   TSH 1.39 mIU/L    Comment:           Reference Range .           > or = 20 Years  0.40-4.50 .                Pregnancy Ranges           First trimester    0.26-2.66           Second trimester   0.55-2.73           Third trimester    0.43-2.91   T4, free     Status: None   Collection Time: 05/23/18  9:34 AM  Result Value Ref Range   Free T4 1.0 0.8 - 1.8 ng/dL  Microalbumin / creatinine urine ratio     Status: Abnormal   Collection Time: 05/23/18  9:34 AM  Result Value Ref Range   Creatinine, Urine 73 20 - 275 mg/dL   Microalb, Ur 2.3 mg/dL    Comment: Reference Range Not established    Microalb Creat Ratio  32 (H) <30 mcg/mg creat    Comment: . The ADA defines abnormalities in albumin excretion as follows: . Marland Kitchenategory         Result (mcg/mg creatinine) . Normal                    <30 Microalbuminuria         30-299  Clinical albuminuria   > OR = 300 . The ADA recommends that at least two of three specimens collected within a 3-6 month period be abnormal before considering a patient to be within a diagnostic category.   VITAMIN D 25 Hydroxy (Vit-D Deficiency, Fractures)     Status: Abnormal   Collection Time: 05/23/18  9:34 AM  Result Value Ref Range   Vit D, 25-Hydroxy 12 (L) 30 - 100 ng/mL    Comment: Vitamin D Status         25-OH Vitamin D: . Deficiency:                    <20 ng/mL Insufficiency:             20 - 29 ng/mL Optimal:                 > or = 30 ng/mL .  For 25-OH Vitamin D testing on patients on  D2-supplementation and patients for whom quantitation  of D2 and D3 fractions is required, the [facility]ureD(TM) 25-OH VIT D, (D2,D3), LC/MS/MS is recommended: order  code [phone] (patients >69yr). See Note 1 . Note 1 . For additional information, please refer to  http://education.[facility].com/faq/FAQ199  (This link is being provided for informational/ educational purposes only.)   Glutamic acid decarboxylase auto abs     Status: Abnormal   Collection Time: 05/23/18  9:34 AM  Result Value Ref Range   Glutamic Acid Decarb Ab 61 (H) <5 IU/mL    Comment: . This test was performed using the GAD65 ELISA method, which is standardized against the International reference preparation 97/550. .   Islet Cell Ab Screen rflx to Titer     Status: None   Collection Time: 05/23/18  9:34 AM  Result Value Ref Range   ISLET CELL ANTIBODY SCREEN NEGATIVE NEGATIVE    Comment: . This test was developed and its analytical performance characteristics have been [facility] It has not been cleared or approved by FDA.  This assay has been validated pursuant to the CLIA regulations and is used for clinical purposes.      Diabetic Labs (most recent): Lab Results  Component Value Date   HGBA1C 9.0 (H) 05/23/2018   HGBA1C 7.7 01/30/2018   HGBA1C 7.4 (H) 02/27/2009     Lipid Panel ( most recent) Lipid Panel     Component Value Date/Time   CHOL 154 01/30/2018   TRIG 59 01/30/2018   HDL 66 01/30/2018   LDLCALC 76 01/30/2018     Assessment & Plan:   1. Uncontrolled type 1 diabetes mellitus with hyperglycemia (HCC)  - Solaris S MBertelsonhas currently uncontrolled symptomatic type 2 DM since 48years of age. -Her most recent A1c is 9%, anti-GAD antibody elevated at 61 suggesting type 1 diabetes.   -her diabetes is complicated by obesity/sedentary life and she remains at a high risk for more acute and chronic complications which include CAD, CVA, CKD, retinopathy, and neuropathy. These are all discussed in detail with her.  - I have counseled her on diet management and weight loss, by adopting a carbohydrate restricted/protein rich diet. - Patient admits there is a room for improvement in her diet and drink choices. -  Suggestion is made for her to avoid simple carbohydrates  from her diet including Cakes, Sweet Desserts / Pastries, Ice Cream, Soda (diet and regular), Sweet Tea, Candies, Chips, Cookies, Store Bought Juices, Alcohol in Excess of  1-2 drinks a day, Artificial Sweeteners, and "Sugar-free" Products. This will help patient to have stable blood glucose profile and potentially avoid unintended weight gain.  - I encouraged her to switch to  unprocessed or minimally processed complex starch and increased protein intake (animal or plant source), fruits, and vegetables.  - she is advised to stick to a routine mealtimes to eat 3 meals  a day and avoid unnecessary snacks ( to snack only to correct hypoglycemia).     - I have approached her with the following individualized plan to manage  diabetes and patient agrees:   -She will continue to need intensive treatment with basal/bolus insulin in order for her to achieve and maintain control of diabetes to target. -She is advised to increase her Lantus to 20 units nightly, readjusted her prandial insulin Humalog to 8 units 3 times a day with meals  for pre-meal BG readings of  70-118m/dl, plus patient specific correction dose for unexpected hyperglycemia above 1556mdl, associated with strict monitoring of glucose 4 times a day-before meals and at bedtime. - she is warned not to take insulin without proper monitoring per orders. - Adjustment parameters are given to her for hypo and hyperglycemia in writing. - she is encouraged to call clinic for blood glucose levels less than 70 or above 300 mg /dl.  - she is not a candidate for metformin, SGLT2 inhibitors, nor incretin therapy.   - Patient specific target  A1c;  LDL, HDL, Triglycerides, and  Waist Circumference were discussed in detail.  2) Blood Pressure /Hypertension:  her blood pressure is controlled to target.  She is not on anti-hypertensive medications for now.  3) Lipids/Hyperlipidemia:   Review of her recent lipid panel showed uncontrolled LDL at 119.  She is advised to continue atorvastatin 20 mg nightly.    4)  Weight/Diet:  Body mass index is 34.77 kg/m.  -   clearly complicating her diabetes care.  I discussed with her the fact that loss of 5 - 10% of her  current body weight will have the most impact on her diabetes management.  CDE Consult will be initiated . Exercise, and detailed carbohydrates information provided  -  detailed on discharge instructions.  5) Chronic Care/Health Maintenance:  -she  Is not on ACEI/ARB and Statin medications and  is encouraged to initiate and continue to follow up with Ophthalmology, Dentist,  Podiatrist at least yearly or according to recommendations, and advised to   stay away from smoking. I have recommended yearly flu vaccine and  pneumonia vaccine at least every 5 years; moderate intensity exercise for up to 150 minutes weekly; and  sleep for at least 7 hours a day.  - she is  advised to maintain close follow up with DaCaryl BisMD for primary care needs, as well as her other providers for optimal and coordinated care.  - Time spent with the patient: 25 min, of which >50% was spent in reviewing her blood glucose logs , discussing her hypoglycemia and hyperglycemia episodes, reviewing her current and  previous labs / studies and medications  doses and developing a plan to avoid hypoglycemia and hyperglycemia. Please refer to Patient Instructions for Blood Glucose Monitoring and Insulin/Medications Dosing Guide"  in media tab for additional information. Please  also refer to " Patient Self Inventory" in the Media  tab for reviewed elements of pertinent patient history.  AnMarliss Czararticipated in the discussions, expressed understanding, and voiced agreement with the above plans.  All questions were answered to her satisfaction. she is encouraged to contact clinic should she have any questions or concerns prior to her return visit.   Follow up plan: - Return in about 3 months (around 09/04/2018) for Meter, and Logs.  GeGlade LloydMD [facility] [address]hone: [phone]: [phone]  06/04/2018, 3:27 PM  This note was partially dictated with voice recognition software. Similar sounding words can be transcribed inadequately or may not  be corrected upon review.

## 2018-06-18 ENCOUNTER — Telehealth: Payer: Self-pay | Admitting: "Endocrinology

## 2018-06-18 NOTE — Telephone Encounter (Signed)
Pt left a VM over the weekend that she has been having low blood sugars and would like the nurse to call her

## 2018-06-26 NOTE — Telephone Encounter (Signed)
Fri 06/19 277-Am 185- [phone]-Bedtime  Sat 06/20 295-Am 160 202 180-Bedtime  Sun 06/21 311-Am 230 163 99-Bedtime  Mon 06/22 317-Am 200 120 230-Bedtime  Tues 06/23 317-Am  She states that she lowered Lantus down to 18 units please advise?

## 2018-06-26 NOTE — Telephone Encounter (Signed)
Lowering the lantus is not the right adjustment. She needs more lantus, advise her increase to 22 units qhs.

## 2018-06-26 NOTE — Telephone Encounter (Signed)
Patient is aware of the recommendation

## 2018-06-28 ENCOUNTER — Ambulatory Visit (INDEPENDENT_AMBULATORY_CARE_PROVIDER_SITE_OTHER): Payer: Medicaid Other | Admitting: Internal Medicine

## 2018-06-28 ENCOUNTER — Encounter (INDEPENDENT_AMBULATORY_CARE_PROVIDER_SITE_OTHER): Payer: Self-pay | Admitting: Internal Medicine

## 2018-06-28 ENCOUNTER — Other Ambulatory Visit: Payer: Self-pay

## 2018-06-28 VITALS — BP 145/87 | HR 81 | Temp 98.0°F | Ht 61.0 in | Wt 182.4 lb

## 2018-06-28 DIAGNOSIS — K219 Gastro-esophageal reflux disease without esophagitis: Secondary | ICD-10-CM | POA: Diagnosis not present

## 2018-06-28 DIAGNOSIS — K5120 Ulcerative (chronic) proctitis without complications: Secondary | ICD-10-CM

## 2018-06-28 MED ORDER — MESALAMINE 1000 MG RE SUPP: 1000.0000 mg | Freq: Every day | RECTAL | 1 refills | Status: DC

## 2018-06-28 NOTE — Patient Instructions (Signed)
Canasa supp for 2 weeks and call me and let me know how u are doing.

## 2018-06-28 NOTE — Progress Notes (Signed)
Subjective:    Patient ID: Brittney [name], female    DOB: June 14, 1970, 48 y.o.   MRN: [id number]  HPI Here today for f/u. She was last seen in September of 2019. Hx of UC and maintained on Apriso 4 caps daily.  Diagnosed in 1997.  Her last colonoscopy was in October of 2015.  She tells me that since May, she has been having some issues with her UC. She has had some blood in her stools occasionally. Abdominal pain off and on. She feels slightly bloated. She can eat a meal and then feels full, but slightly hungry. She is having 1-3 stools a day. She is not taking Miralax. GERD for the most controlled with Omeprazole.    Review of Systems Past Medical History:  Diagnosis Date  . Breast cancer ([place])   . Breast cancer of upper-outer quadrant of right female breast ([place]) 06/11/2015   Triple negative, 2 cm   . Chemotherapy induced neutropenia ([place]) 09/10/2015  . Diabetes mellitus ([place])    Type 1 over 15 yrs  . Environmental allergies   . GERD (gastroesophageal reflux disease)   . Iron deficiency anemia due to chronic blood loss 06/12/2015  . Personal history of chemotherapy   . Personal history of radiation therapy   . 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 BREAST LUMPECTOMY Right 2017  . CESAREAN SECTION    . COLONOSCOPY N/A 10/09/2013   Procedure: COLONOSCOPY;  Surgeon: Rogene Houston, MD;  Location[facility];  Service: Endoscopy;  Laterality: N/A;  100  . DILATION AND CURETTAGE OF UTERUS     x 2 for miscarriages  . ESOPHAGOGASTRODUODENOSCOPY    . FLEXIBLE SIGMOIDOSCOPY    . FLEXIBLE SIGMOIDOSCOPY  07/27/2011   Procedure: FLEXIBLE SIGMOIDOSCOPY;  Surgeon: Rogene Houston, MD;  Location: [facility];  Service: Endoscopy;  Laterality: N/A;  100    Allergies  Allergen Reactions  . Ace Inhibitors Itching  . Peanut Oil Itching  . Shellfish Allergy Itching and Rash  . Statins Itching  . Adhesive [Tape] Rash   . Pravastatin Sodium Itching    Current Outpatient Medications on File Prior to Visit  Medication Sig Dispense Refill  . acetaminophen (TYLENOL) 500 MG tablet Take 1,000 mg by mouth every 6 (six) hours as needed. For pain    . acyclovir (ZOVIRAX) 200 MG capsule Take 200 mg by mouth 2 (two) times daily. Reported on 07/23/2015    . albuterol (PROVENTIL HFA;VENTOLIN HFA) 108 (90 Base) MCG/ACT inhaler Inhale 2 puffs into the lungs every 4 (four) hours.     . APRISO 0.375 g 24 hr capsule 4 capsules daily.    [name] atorvastatin (LIPITOR) 20 MG tablet daily.    . fluticasone (FLOVENT DISKUS) 50 MCG/BLIST diskus inhaler Inhale 1 puff into the lungs 2 (two) times daily.    [name] gabapentin (NEURONTIN) 300 MG capsule Take 600 mg by mouth at bedtime. 300 mg in am and 600 mg at bedtime.  6  . HUMALOG 100 UNIT/ML injection Inject 8-11 Units into the skin 3 (three) times daily with meals.  6  . LANTUS SOLOSTAR 100 UNIT/ML Solostar Pen Inject 20 Units into the skin at bedtime.  6  . loratadine (CLARITIN) 10 MG tablet TAKE ONE TABLET BY MOUTH DAILY FOR HIVES.  6  . montelukast (SINGULAIR) 10 MG tablet Take 10 mg by mouth every evening.    [name]  Olopatadine HCl (PATADAY) 0.2 % SOLN Apply 1 drop to eye daily. Both eyes    . omeprazole (PRILOSEC) 20 MG capsule Take 40 mg by mouth 2 (two) times daily before a meal. Pt takes 20 mg before breakfast and 20 mg before dinner     Current Facility-Administered Medications on File Prior to Visit  Medication Dose Route Frequency Provider Last Rate Last Dose  . 0.9 %  sodium chloride infusion   Intravenous Continuous Kefalas, Manon Hilding, PA-C      . 0.9 %  sodium chloride infusion   Intravenous Continuous Higgs, Mathis Dad, MD   Stopped at 05/26/17 1510        Objective:   Physical Exam Blood pressure (!) 145/87, pulse 81, temperature 98 F (36.7 C), height 5' 1"  (1.549 m), weight 182 lb 6.4 oz (82.7 kg), last menstrual period 09/04/2015. Alert and oriented. Skin warm and dry. Oral  mucosa is moist.   . Sclera anicteric, conjunctivae is pink. Thyroid not enlarged. No cervical lymphadenopathy. Lungs clear. Heart regular rate and rhythm.  Abdomen is soft. Bowel sounds are positive. No hepatomegaly. No abdominal masses felt. No tenderness.  No edema to lower extremities.          Assessment & Plan:  UC. She has been seeing some blood and mucous. Will get a CBC and sedrate. . Last colonoscopy in 2015. Rx for Canasa supp to her pharmacy GERD: Continue the Pepcid.

## 2018-06-29 LAB — CBC WITH DIFFERENTIAL/PLATELET
Absolute Monocytes: 432 cells/uL (ref 200–950)
Basophils Absolute: 0 cells/uL (ref 0–200)
Basophils Relative: 0 %
Eosinophils Absolute: 0 cells/uL — ABNORMAL LOW (ref 15–500)
Eosinophils Relative: 0 %
HCT: 38.4 % (ref 35.0–45.0)
Hemoglobin: 12.3 g/dL (ref 11.7–15.5)
Lymphs Abs: 1480 cells/uL (ref 850–3900)
MCH: 27.5 pg (ref 27.0–33.0)
MCHC: 32 g/dL (ref 32.0–36.0)
MCV: 85.7 fL (ref 80.0–100.0)
MPV: 11.8 fL (ref 7.5–12.5)
Monocytes Relative: 10.8 %
Neutro Abs: 2088 cells/uL (ref 1500–7800)
Neutrophils Relative %: 52.2 %
Platelets: 170 10*3/uL (ref 140–400)
RBC: 4.48 10*6/uL (ref 3.80–5.10)
RDW: 12.9 % (ref 11.0–15.0)
Total Lymphocyte: 37 %
WBC: 4 10*3/uL (ref 3.8–10.8)

## 2018-06-29 LAB — SEDIMENTATION RATE: Sed Rate: 17 mm/h (ref 0–20)

## 2018-06-29 NOTE — Progress Notes (Signed)
PT Allen. PLEASE CONTACT PT TO DISCUSS HER CONCERNS.

## 2018-07-02 DIAGNOSIS — K210 Gastro-esophageal reflux disease with esophagitis: Secondary | ICD-10-CM | POA: Diagnosis not present

## 2018-07-16 ENCOUNTER — Other Ambulatory Visit: Payer: Self-pay

## 2018-07-16 ENCOUNTER — Inpatient Hospital Stay (HOSPITAL_COMMUNITY): Payer: Medicaid Other | Attending: Hematology

## 2018-07-16 DIAGNOSIS — E538 Deficiency of other specified B group vitamins: Secondary | ICD-10-CM | POA: Diagnosis not present

## 2018-07-16 DIAGNOSIS — Z9221 Personal history of antineoplastic chemotherapy: Secondary | ICD-10-CM | POA: Diagnosis not present

## 2018-07-16 DIAGNOSIS — D508 Other iron deficiency anemias: Secondary | ICD-10-CM | POA: Diagnosis not present

## 2018-07-16 DIAGNOSIS — Z800 Family history of malignant neoplasm of digestive organs: Secondary | ICD-10-CM | POA: Diagnosis not present

## 2018-07-16 DIAGNOSIS — Z79899 Other long term (current) drug therapy: Secondary | ICD-10-CM | POA: Diagnosis not present

## 2018-07-16 DIAGNOSIS — Z7951 Long term (current) use of inhaled steroids: Secondary | ICD-10-CM | POA: Insufficient documentation

## 2018-07-16 DIAGNOSIS — Z803 Family history of malignant neoplasm of breast: Secondary | ICD-10-CM | POA: Diagnosis not present

## 2018-07-16 DIAGNOSIS — C50411 Malignant neoplasm of upper-outer quadrant of right female breast: Secondary | ICD-10-CM | POA: Insufficient documentation

## 2018-07-16 DIAGNOSIS — Z794 Long term (current) use of insulin: Secondary | ICD-10-CM | POA: Diagnosis not present

## 2018-07-16 DIAGNOSIS — E119 Type 2 diabetes mellitus without complications: Secondary | ICD-10-CM | POA: Diagnosis not present

## 2018-07-16 DIAGNOSIS — Z171 Estrogen receptor negative status [ER-]: Secondary | ICD-10-CM | POA: Diagnosis not present

## 2018-07-16 DIAGNOSIS — K5190 Ulcerative colitis, unspecified, without complications: Secondary | ICD-10-CM | POA: Diagnosis not present

## 2018-07-16 DIAGNOSIS — D500 Iron deficiency anemia secondary to blood loss (chronic): Secondary | ICD-10-CM

## 2018-07-16 DIAGNOSIS — Z923 Personal history of irradiation: Secondary | ICD-10-CM | POA: Insufficient documentation

## 2018-07-16 LAB — COMPREHENSIVE METABOLIC PANEL
ALT: 23 U/L (ref 0–44)
AST: 24 U/L (ref 15–41)
Albumin: 3.8 g/dL (ref 3.5–5.0)
Alkaline Phosphatase: 84 U/L (ref 38–126)
Anion gap: 10 (ref 5–15)
BUN: 13 mg/dL (ref 6–20)
CO2: 28 mmol/L (ref 22–32)
Calcium: 9.4 mg/dL (ref 8.9–10.3)
Chloride: 101 mmol/L (ref 98–111)
Creatinine, Ser: 0.49 mg/dL (ref 0.44–1.00)
GFR calc Af Amer: >60 mL/min (ref >60)
GFR calc non Af Amer: >60 mL/min (ref >60)
Glucose, Bld: 317 mg/dL — ABNORMAL HIGH (ref 70–99)
Potassium: 4.3 mmol/L (ref 3.5–5.1)
Sodium: 139 mmol/L (ref 135–145)
Total Bilirubin: 0.9 mg/dL (ref 0.3–1.2)
Total Protein: 6.6 g/dL (ref 6.5–8.1)

## 2018-07-16 LAB — CBC WITH DIFFERENTIAL/PLATELET
Abs Immature Granulocytes: 0.02 10*3/uL (ref 0.00–0.07)
Basophils Absolute: 0 10*3/uL (ref 0.0–0.1)
Basophils Relative: 0 %
Eosinophils Absolute: 0 10*3/uL (ref 0.0–0.5)
Eosinophils Relative: 0 %
HCT: 37 % (ref 36.0–46.0)
Hemoglobin: 11.5 g/dL — ABNORMAL LOW (ref 12.0–15.0)
Immature Granulocytes: 1 %
Lymphocytes Relative: 26 %
Lymphs Abs: 1.1 10*3/uL (ref 0.7–4.0)
MCH: 27.8 pg (ref 26.0–34.0)
MCHC: 31.1 g/dL (ref 30.0–36.0)
MCV: 89.6 fL (ref 80.0–100.0)
Monocytes Absolute: 0.4 10*3/uL (ref 0.1–1.0)
Monocytes Relative: 10 %
Neutro Abs: 2.5 10*3/uL (ref 1.7–7.7)
Neutrophils Relative %: 63 %
Platelets: 159 10*3/uL (ref 150–400)
RBC: 4.13 MIL/uL (ref 3.87–5.11)
RDW: 13.6 % (ref 11.5–15.5)
WBC: 4 10*3/uL (ref 4.0–10.5)
nRBC: 0 % (ref 0.0–0.2)

## 2018-07-16 LAB — IRON AND TIBC
Iron: 56 ug/dL (ref 28–170)
Saturation Ratios: 18 % (ref 10.4–31.8)
TIBC: 319 ug/dL (ref 250–450)
UIBC: 263 ug/dL

## 2018-07-16 LAB — FERRITIN: Ferritin: 146 ng/mL (ref 11–307)

## 2018-07-16 LAB — FOLATE: Folate: 18.1 ng/mL (ref >5.9)

## 2018-07-16 LAB — VITAMIN B12: Vitamin B-12: 219 pg/mL (ref 180–914)

## 2018-07-23 ENCOUNTER — Other Ambulatory Visit: Payer: Self-pay

## 2018-07-23 ENCOUNTER — Inpatient Hospital Stay (HOSPITAL_BASED_OUTPATIENT_CLINIC_OR_DEPARTMENT_OTHER): Payer: Medicaid Other | Admitting: Hematology

## 2018-07-23 ENCOUNTER — Encounter (HOSPITAL_COMMUNITY): Payer: Self-pay | Admitting: Hematology

## 2018-07-23 VITALS — BP 145/83 | HR 69 | Temp 97.1°F | Resp 20 | Wt 187.7 lb

## 2018-07-23 DIAGNOSIS — C50411 Malignant neoplasm of upper-outer quadrant of right female breast: Secondary | ICD-10-CM | POA: Diagnosis not present

## 2018-07-23 DIAGNOSIS — Z171 Estrogen receptor negative status [ER-]: Secondary | ICD-10-CM | POA: Diagnosis not present

## 2018-07-23 DIAGNOSIS — D508 Other iron deficiency anemias: Secondary | ICD-10-CM | POA: Diagnosis not present

## 2018-07-23 DIAGNOSIS — E538 Deficiency of other specified B group vitamins: Secondary | ICD-10-CM | POA: Diagnosis not present

## 2018-07-23 DIAGNOSIS — Z923 Personal history of irradiation: Secondary | ICD-10-CM

## 2018-07-23 DIAGNOSIS — Z794 Long term (current) use of insulin: Secondary | ICD-10-CM

## 2018-07-23 DIAGNOSIS — Z803 Family history of malignant neoplasm of breast: Secondary | ICD-10-CM

## 2018-07-23 DIAGNOSIS — Z9221 Personal history of antineoplastic chemotherapy: Secondary | ICD-10-CM

## 2018-07-23 DIAGNOSIS — Z800 Family history of malignant neoplasm of digestive organs: Secondary | ICD-10-CM

## 2018-07-23 DIAGNOSIS — D500 Iron deficiency anemia secondary to blood loss (chronic): Secondary | ICD-10-CM

## 2018-07-23 DIAGNOSIS — Z79899 Other long term (current) drug therapy: Secondary | ICD-10-CM

## 2018-07-23 DIAGNOSIS — Z7951 Long term (current) use of inhaled steroids: Secondary | ICD-10-CM

## 2018-07-23 DIAGNOSIS — K5190 Ulcerative colitis, unspecified, without complications: Secondary | ICD-10-CM

## 2018-07-23 DIAGNOSIS — E119 Type 2 diabetes mellitus without complications: Secondary | ICD-10-CM

## 2018-07-23 NOTE — Assessment & Plan Note (Signed)
1.  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 Anthony Sar on 05/13/2015. - Status post adjuvant chemotherapy with dose dense AC followed by 3 cycles of dose dense docetaxel from 06/25/2015 through 11/12/2015. -Physical exam today is within normal limits.  Lumpectomy scar in the right breast upper outer quadrant is stable.  No palpable masses. - We reviewed results of the mammogram dated 05/29/2018 which was BI-RADS Category 2. - We have reviewed chemistries which are also within normal limits.  We will plan to repeat physical exam and labs in 4 months.  2.  Iron deficiency state: - This is from chronic blood loss from ulcerative colitis.  Last Feraheme was on 11/27/2017.  She had another episode of bleeding around 20 June.  She used Canasa suppositories for 7 days which helped. -We reviewed blood work.  Ferritin is 146, down from 186.  Percent saturation is 18.  Hemoglobin is 11.5.  She does not require any parenteral iron at this time. -We will plan to repeat her labs in 4 months.  She has some tiredness at this time.  She was told to call us if her tiredness gets worse.  3.  B12 deficiency: - Her latest B12 level was low at 219.  I have recommended taking B12 1 mg tablet daily.  She has chronic on and off numbness of the right hand which is stable.  4.  Ulcerative colitis: - She had episode of rectal bleeding 1 week prior to 06/28/2018.  She used Canasa suppository for 7 days and the bleeding subsided. -She is also taking mesalamine 4 tablets daily.

## 2018-07-23 NOTE — Patient Instructions (Addendum)
[place] at [facility] Discharge Instructions  You were seen today by Dr. Delton Coombes. He went over your recent lab results. He will see you back in 4 months for labs and follow up.  Start taking an over the counter B12 supplement daily.  Thank you for choosing [place] at [facility] to provide your oncology and hematology care.  To afford each patient quality time with our provider, please arrive at least 15 minutes before your scheduled appointment time.   If you have a lab appointment with the [facility] please come in thru the  Main Entrance and check in at the main information desk  You need to re-schedule your appointment should you arrive 10 or more minutes late.  We strive to give you quality time with our providers, and arriving late affects you and other patients whose appointments are after yours.  Also, if you no show three or more times for appointments you may be dismissed from the clinic at the providers discretion.     Again, thank you for choosing [facility].  Our hope is that these requests will decrease the amount of time that you wait before being seen by our physicians.       _____________________________________________________________  Should you have questions after your visit to [facility], please contact our office at [phone] between the hours of 8:00 a.m. and 4:30 p.m.  Voicemails left after 4:00 p.m. will not be returned until the following business day.  For prescription refill requests, have your pharmacy contact our office and allow 72 hours.    Cancer Center Support Programs:   > Cancer Support Group  2nd Tuesday of the month 1pm-2pm, Journey Room

## 2018-07-23 NOTE — Progress Notes (Signed)
[address] [place], [place] [address]   CLINIC:  Medical Oncology/Hematology  PCP:  [name], MD [place] [address] [phone]   REASON FOR VISIT:  Follow-up for Iron deficiency anemia due to chronic blood loss, Malignant neoplasm of upper-outer quadrant of right breast in female, estrogen receptor negative.  BRIEF ONCOLOGIC HISTORY:  Oncology History  Malignant neoplasm of upper-outer quadrant of right female breast ([place])  04/29/2015 Initial Biopsy   upper outer quadrant mass R breast 2.9 cm by ultrasound, high grade invasive ductal carcinoma ER< 1%, PR 1%, HER -2 not amplified, Ki-67 66%   05/13/2015 Cancer Staging   pT1cN0   05/13/2015 Surgery   lumpectomy and sentinel node biopsies X 3, 2 cm tumor, 3 negative nodes    05/21/2015 Imaging   CT [facility] with postsurgical changes related to R breast lympectomy and R axillary LN dissection, no findings suspicious for metastatic disease   06/12/2015 Procedure   Port placement by Dr. Anthony Sar   06/17/2015 Imaging   MUGA- Normal left ventricular ejection fraction equal to 62.9%.   06/25/2015 - 08/13/2015 Chemotherapy   AC x 4   08/27/2015 - 09/03/2015 Chemotherapy   Weekly Taxol x 2 cycles    09/11/2015 - 09/15/2015 Hospital Admission   Neutropenic fever, ARF secondary to antibiotics   10/01/2015 - 11/12/2015 Chemotherapy   Taxotere 75 mg/m2 Q3 weeks x 3 cycles     10/01/2015 Treatment Plan Change   Taxol changed to Taxotere given hospitalization.     - 01/14/2016 Radiation Therapy   Radiation in [facility]   05/11/2016 Mammogram   IMPRESSION: No evidence of malignancy within either breast. Expected postsurgical changes within the right breast.       CANCER STAGING: Cancer Staging Malignant neoplasm of upper-outer quadrant of right female breast [facility]) Staging form: Breast, AJCC 7th Edition - Clinical stage from 05/13/2015: Stage IA (T1c, N0, M0) - Signed by Baird Cancer, PA-C on 06/25/2015    INTERVAL HISTORY:  Brittney [name] 48 y.o. female seen for follow-up of breast cancer and iron deficiency anemia.  Denies any new onset pains.  Appetite is 50%.  Energy levels are 25%.  She has generalized pains rated as 5 out of 10.  She had a flareup of ulcerative colitis with rectal bleeding in mid June.  She was given Canasa suppositories which controlled the bleeding.  Occasional numbness in the right hand is also stable.  Denies any fevers, night sweats or weight loss in the last 6 months.  Denies any hospitalizations or ER visits.    REVIEW OF SYSTEMS:  Review of Systems  Constitutional: Positive for fatigue.  Respiratory: Positive for shortness of breath.   Neurological: Positive for headaches and numbness.     PAST MEDICAL/SURGICAL HISTORY:  Past Medical History:  Diagnosis Date  . Breast cancer ([place])   . Breast cancer of upper-outer quadrant of right female breast ([place]) 06/11/2015   Triple negative, 2 cm   . Chemotherapy induced neutropenia ([place]) 09/10/2015  . Diabetes mellitus ([place])    Type 1 over 15 yrs  . Environmental allergies   . GERD (gastroesophageal reflux disease)   . Iron deficiency anemia due to chronic blood loss 06/12/2015  . Personal history of chemotherapy   . Personal history of radiation therapy   . 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 BREAST LUMPECTOMY Right  2017  . CESAREAN SECTION    . COLONOSCOPY N/A 10/09/2013   Procedure: COLONOSCOPY;  Surgeon: Rogene Houston, MD;  Location: [facility];  Service: Endoscopy;  Laterality: N/A;  100  . DILATION AND CURETTAGE OF UTERUS     x 2 for miscarriages  . ESOPHAGOGASTRODUODENOSCOPY    . FLEXIBLE SIGMOIDOSCOPY    . FLEXIBLE SIGMOIDOSCOPY  07/27/2011   Procedure: FLEXIBLE SIGMOIDOSCOPY;  Surgeon: Rogene Houston, MD;  Location: [facility]O SU[facility];  Service: Endoscopy;  Laterality: N/A;  100     SOCIAL HISTORY:   Social History   Socioeconomic History  . Marital status: Single    Spouse name: Not on file  . Number of children: Not on file  . Years of education: Not on file  . Highest education level: Not on file  Occupational History  . Not on file  Social Needs  . Financial resource strain: Not on file  . Food insecurity    Worry: Not on file    Inability: Not on file  . Transportation needs    Medical: Not on file    Non-medical: Not on file  Tobacco Use  . Smoking status: Never Smoker  . Smokeless tobacco: Never Used  Substance and Sexual Activity  . Alcohol use: No    Alcohol/week: 0.0 standard drinks  . Drug use: No  . Sexual activity: Not on file  Lifestyle  . Physical activity    Days per week: Not on file    Minutes per session: Not on file  . Stress: Not on file  Relationships  . Social Herbalist on phone: Not on file    Gets together: Not on file    Attends religious service: Not on file    Active member of club or organization: Not on file    Attends meetings of clubs or organizations: Not on file    Relationship status: Not on file  . Intimate partner violence    Fear of current or ex partner: Not on file    Emotionally abused: Not on file    Physically abused: Not on file    Forced sexual activity: Not on file  Other Topics Concern  . Not on file  Social History Narrative  . Not on file    FAMILY HISTORY:  Family History  Problem Relation Age of Onset  . Non-Hodgkin's lymphoma Mother   . Hypertension Mother   . Diabetes Mellitus II Mother   . Hypertension Father   . Diabetes Mellitus II Father   . Colon cancer Cousin   . Breast cancer Maternal Aunt     CURRENT MEDICATIONS:  Outpatient Encounter Medications as of 07/23/2018  Medication Sig  . acetaminophen (TYLENOL) 500 MG tablet Take 1,000 mg by mouth every 6 (six) hours as needed. For pain  . acyclovir (ZOVIRAX) 200 MG capsule Take 200 mg by mouth 2 (two) times daily. Reported on  07/23/2015  . albuterol (PROVENTIL HFA;VENTOLIN HFA) 108 (90 Base) MCG/ACT inhaler Inhale 2 puffs into the lungs every 4 (four) hours.   . APRISO 0.375 g 24 hr capsule 4 capsules daily.  [name] atorvastatin (LIPITOR) 20 MG tablet daily.  . fluticasone (FLOVENT DISKUS) 50 MCG/BLIST diskus inhaler Inhale 1 puff into the lungs 2 (two) times daily.  [name] gabapentin (NEURONTIN) 300 MG capsule Take 600 mg by mouth at bedtime. 300 mg in am and 600 mg at bedtime.  [name] HUMALOG 100 UNIT/ML injection Inject 8-11 Units into  the skin 3 (three) times daily with meals.  [name] LANTUS SOLOSTAR 100 UNIT/ML Solostar Pen Inject 22 Units into the skin at bedtime.   [name] loratadine (CLARITIN) 10 MG tablet TAKE ONE TABLET BY MOUTH DAILY FOR HIVES.  [name] montelukast (SINGULAIR) 10 MG tablet Take 10 mg by mouth every evening.  . Olopatadine HCl (PATADAY) 0.2 % SOLN Apply 1 drop to eye daily. Both eyes  . omeprazole (PRILOSEC) 20 MG capsule Take 40 mg by mouth 2 (two) times daily before a meal. Pt takes 20 mg before breakfast and 20 mg before dinner  . [DISCONTINUED] mesalamine (CANASA) 1000 MG suppository Place 1 suppository (1,000 mg total) rectally at bedtime.   Facility-Administered Encounter Medications as of 07/23/2018  Medication  . 0.9 %  sodium chloride infusion  . 0.9 %  sodium chloride infusion    ALLERGIES:  Allergies  Allergen Reactions  . Ace Inhibitors Itching  . Peanut Oil Itching  . Shellfish Allergy Itching and Rash  . Statins Itching  . Adhesive [Tape] Rash  . Pravastatin Sodium Itching     PHYSICAL EXAM:  ECOG Performance status: 1  Vitals:   07/23/18 0811  BP: (!) 145/83  Pulse: 69  Resp: 20  Temp: (!) 97.1 F (36.2 C)  SpO2: 100%   Filed Weights   07/23/18 0811  Weight: 187 lb 11.2 oz (85.1 kg)    Physical Exam Constitutional:      Appearance: Normal appearance.  Cardiovascular:     Rate and Rhythm: Normal rate and regular rhythm.     Heart sounds: Normal heart sounds.  Pulmonary:      Effort: Pulmonary effort is normal. No respiratory distress.     Breath sounds: Normal breath sounds.  Abdominal:     General: Bowel sounds are normal. There is no distension.     Palpations: Abdomen is soft.  Genitourinary:    Comments: Bilateral breast exam did not show any masses.  Right lumpectomy site is within normal limits.  No palpable adenopathy. Musculoskeletal:        General: No swelling.  Skin:    General: Skin is warm.  Neurological:     General: No focal deficit present.     Mental Status: She is alert and oriented to person, place, and time.  Psychiatric:        Mood and Affect: Mood normal.        Behavior: Behavior normal.      LABORATORY DATA:  I have reviewed the labs as listed.  CBC    Component Value Date/Time   WBC 4.0 07/16/2018 1119   RBC 4.13 07/16/2018 1119   HGB 11.5 (L) 07/16/2018 1119   HCT 37.0 07/16/2018 1119   PLT 159 07/16/2018 1119   MCV 89.6 07/16/2018 1119   MCH 27.8 07/16/2018 1119   MCHC 31.1 07/16/2018 1119   RDW 13.6 07/16/2018 1119   LYMPHSABS 1.1 07/16/2018 1119   MONOABS 0.4 07/16/2018 1119   EOSABS 0.0 07/16/2018 1119   BASOSABS 0.0 07/16/2018 1119   CMP Latest Ref Rng & Units 07/16/2018 05/23/2018 03/12/2018  Glucose 70 - 99 mg/dL 317(H) 383(H) 297(H)  BUN 6 - 20 mg/dL 13 11 8   Creatinine 0.44 - 1.00 mg/dL 0.49 0.66 0.47  Sodium 135 - 145 mmol/L 139 140 140  Potassium 3.5 - 5.1 mmol/L 4.3 4.2 3.9  Chloride 98 - 111 mmol/L 101 101 103  CO2 22 - 32 mmol/L 28 31 30   Calcium 8.9 - 10.3 mg/dL 9.4  9.2 9.0  Total Protein 6.5 - 8.1 g/dL 6.6 6.3 6.4(L)  Total Bilirubin 0.3 - 1.2 mg/dL 0.9 0.6 0.8  Alkaline Phos 38 - 126 U/L 84 - 80  AST 15 - 41 U/L 24 25 19   ALT 0 - 44 U/L 23 21 15        DIAGNOSTIC IMAGING:  I have independently reviewed the scans and discussed with the patient.   I have reviewed Brittney Lick LPN's note and agree with the documentation.  I personally performed a face-to-face visit, made revisions and my  assessment and plan is as follows.    ASSESSMENT & PLAN:   Malignant neoplasm of upper-outer quadrant of right female breast ([place]) 1.  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 Anthony Sar on 05/13/2015. - Status post adjuvant chemotherapy with dose dense AC followed by 3 cycles of dose dense docetaxel from 06/25/2015 through 11/12/2015. -Physical exam today is within normal limits.  Lumpectomy scar in the right breast upper outer quadrant is stable.  No palpable masses. - We reviewed results of the mammogram dated 05/29/2018 which was BI-RADS Category 2. - We have reviewed chemistries which are also within normal limits.  We will plan to repeat physical exam and labs in 4 months.  2.  Iron deficiency state: - This is from chronic blood loss from ulcerative colitis.  Last Feraheme was on 11/27/2017.  She had another episode of bleeding around 20 June.  She used Canasa suppositories for 7 days which helped. -We reviewed blood work.  Ferritin is 146, down from 186.  Percent saturation is 18.  Hemoglobin is 11.5.  She does not require any parenteral iron at this time. -We will plan to repeat her labs in 4 months.  She has some tiredness at this time.  She was told to call us if her tiredness gets worse.  3.  B12 deficiency: - Her latest B12 level was low at 219.  I have recommended taking B12 1 mg tablet daily.  She has chronic on and off numbness of the right hand which is stable.  4.  Ulcerative colitis: - She had episode of rectal bleeding 1 week prior to 06/28/2018.  She used Canasa suppository for 7 days and the bleeding subsided. -She is also taking mesalamine 4 tablets daily.   Total time spent is 25 minutes with more than 50% of the time spent face-to-face discussing surveillance, counseling and coordination of care.  Orders placed this encounter:  Orders Placed This Encounter  Procedures  . CBC with Differential/Platelet   . Comprehensive metabolic panel  . Iron and TIBC  . Ferritin  . Vitamin B12  . Folate      Derek Jack, MD [place] [phone]

## 2018-07-26 ENCOUNTER — Other Ambulatory Visit: Payer: Medicaid Other

## 2018-07-26 ENCOUNTER — Other Ambulatory Visit: Payer: Self-pay

## 2018-07-26 DIAGNOSIS — Z20822 Contact with and (suspected) exposure to covid-19: Secondary | ICD-10-CM

## 2018-07-29 LAB — NOVEL CORONAVIRUS, NAA: SARS-CoV-2, NAA: NOT DETECTED

## 2018-07-30 IMAGING — CT CT HEAD WO/W CM
3 of 4 series · 16 of 47 positions shown, 19 images · IV contrast (iopamidol)
Comparison: 08/15/2011

CLINICAL DATA: Right breast cancer diagnosed April 2015. Headaches.

EXAM:
CT HEAD WITHOUT AND WITH CONTRAST
TECHNIQUE: Contiguous axial images were obtained from the base of the skull
through the vertex without and with intravenous contrast
CONTRAST:  75mL [id number] IOPAMIDOL intravenous

[Series 2: head wo · axial · 0.44mm/px · z∈[+98,+228]mm · 10 of 32 slices shown, 13 images]
[im 3/32  brain]
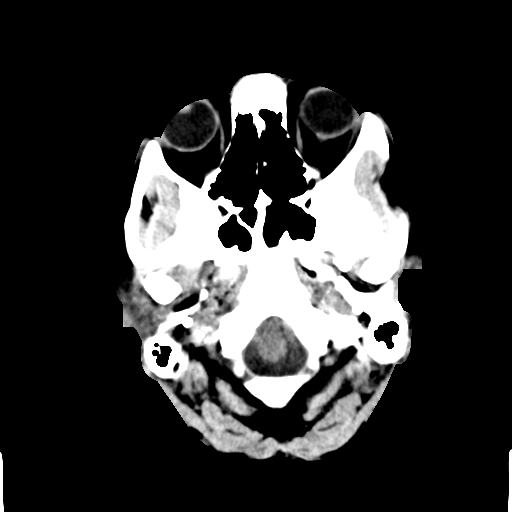
[im 3/32  bone]
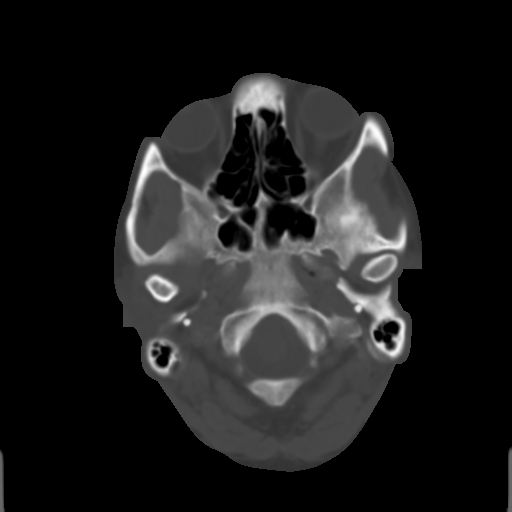
[im 5/32  brain]
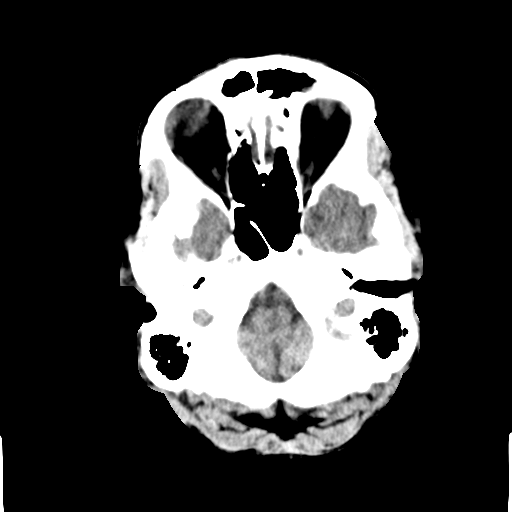
[im 9/32  brain]
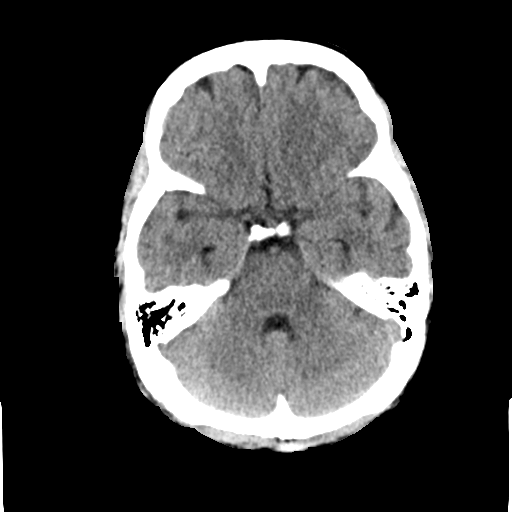
[im 12/32  brain]
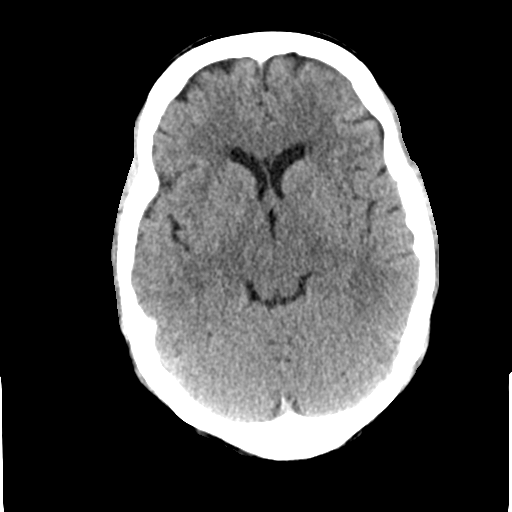
[im 14/32  brain]
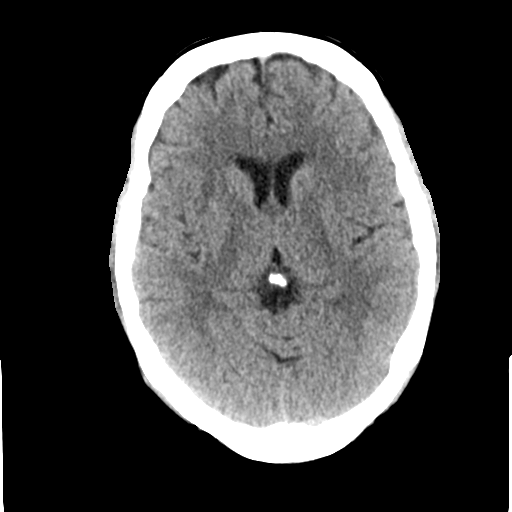
[im 14/32  bone]
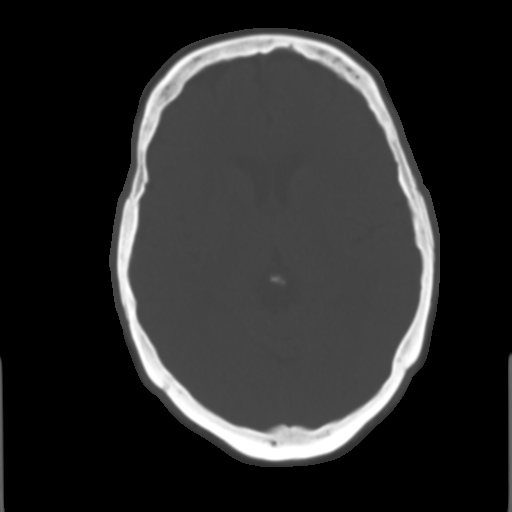
[im 18/32  brain]
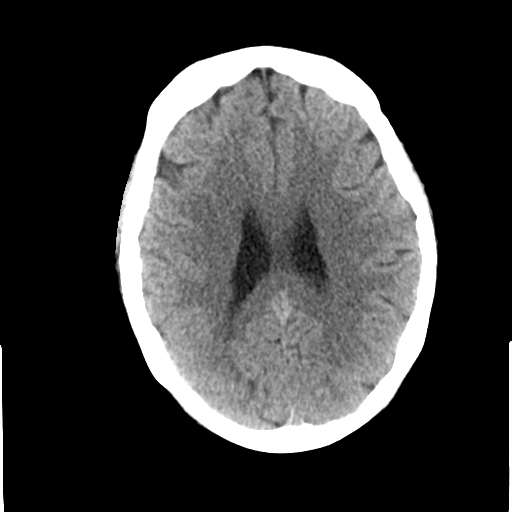
[im 20/32  brain]
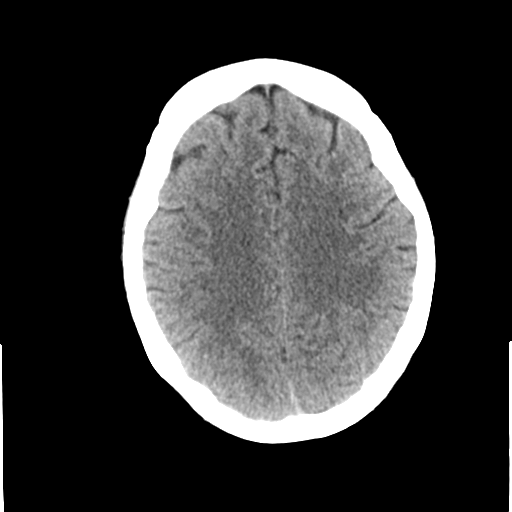
[im 23/32  brain]
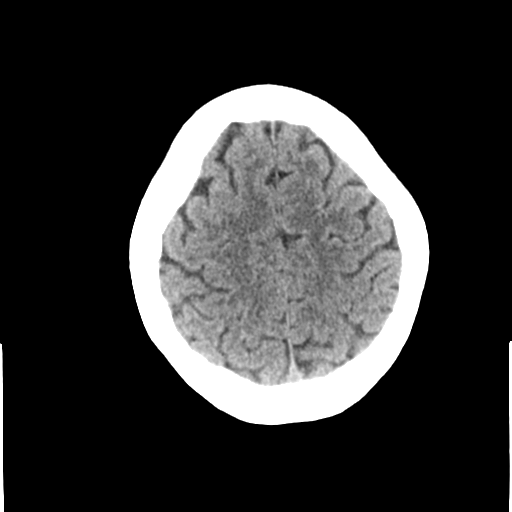
[im 27/32  brain]
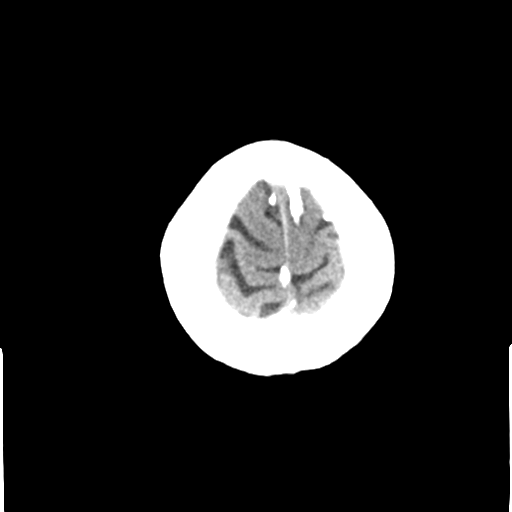
[im 27/32  bone]
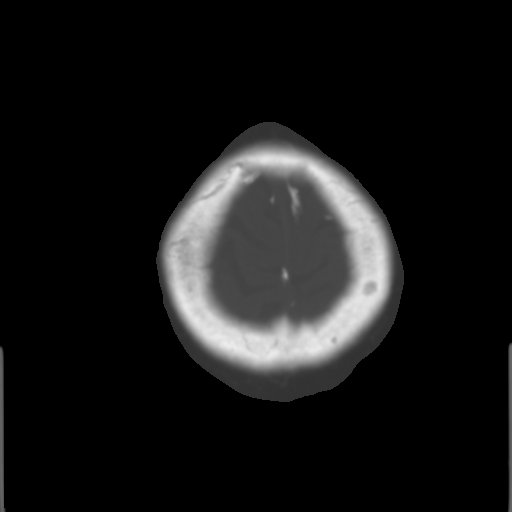
[im 29/32  brain]
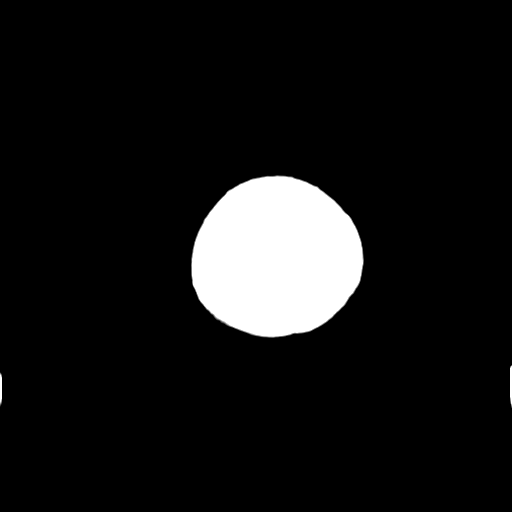

[Series 5: coronal soft tissue · coronal · 0.31mm/px · 3 of 64 slices shown]
[im 22/64  brain]
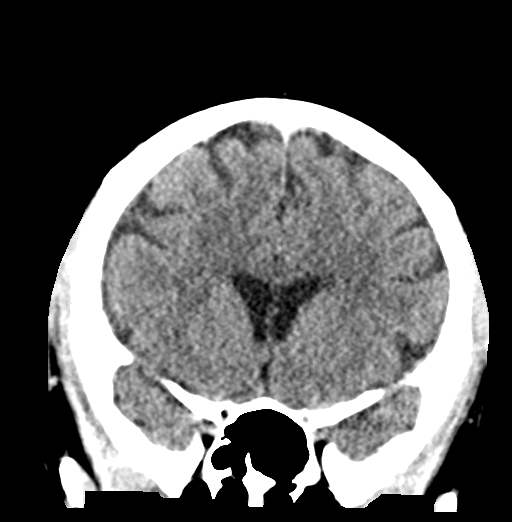
[im 29/64  brain]
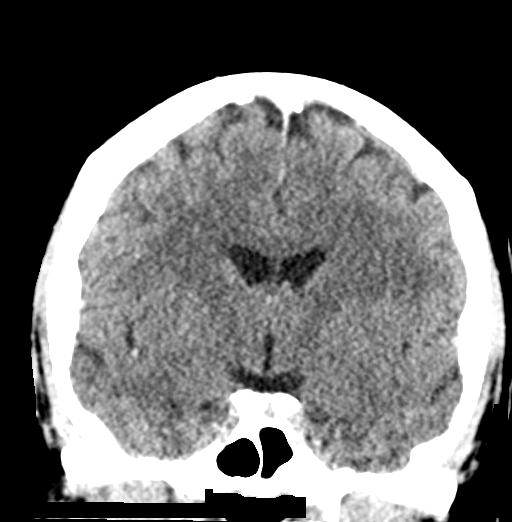
[im 36/64  brain]
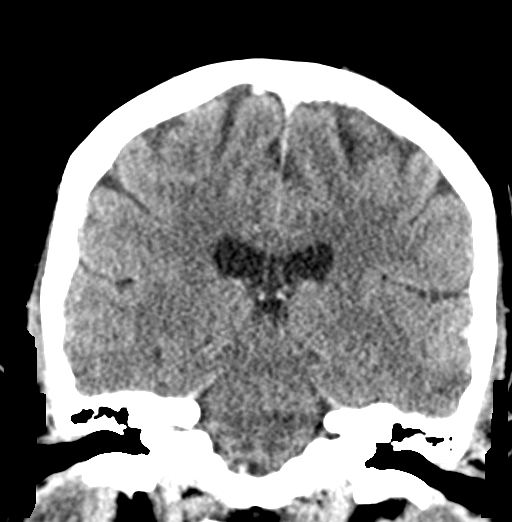

[Series 6: sagittal soft tissue · sagittal · 0.30mm/px · 3 of 51 slices shown]
[im 17/51  brain]
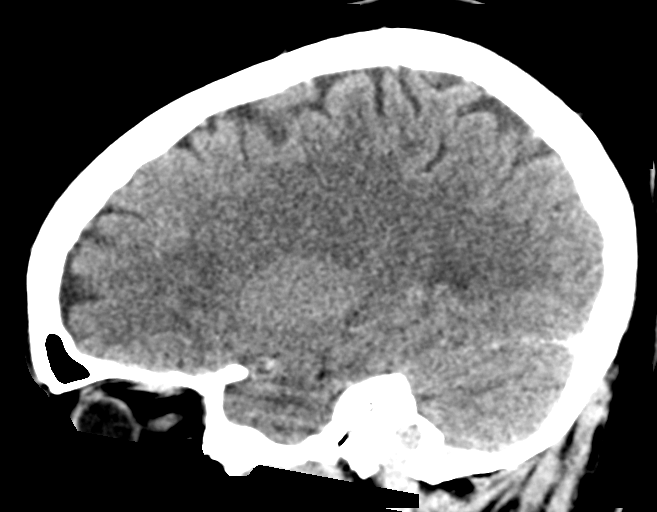
[im 26/51  brain]
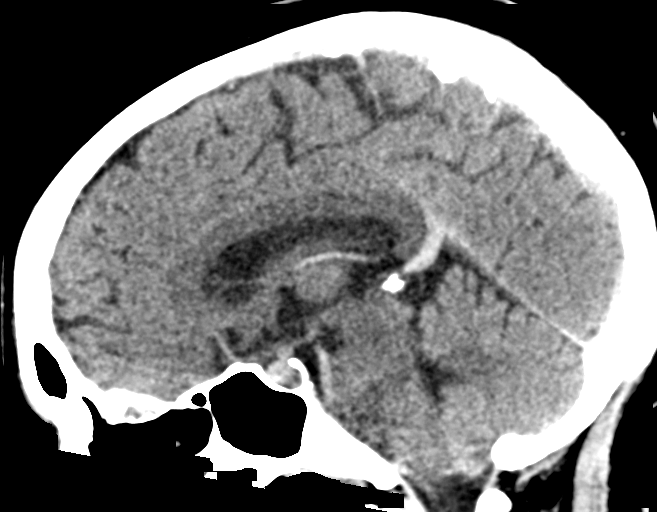
[im 34/51  brain]
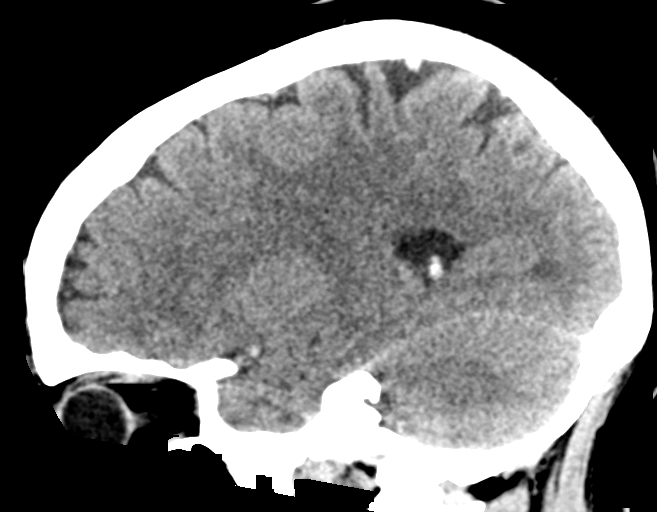

[16 of 47 positions shown; findings below may reference images not displayed]

FINDINGS: Brain: No evidence of acute infarction, hemorrhage, hydrocephalus,
extra-axial collection or mass lesion/mass effect.

Vascular: No hyperdense vessel or unexpected calcification. Visible
vessels are patent.

Skull: Stable and negative for fracture or focal lesion.

Sinuses/Orbits: Negative
IMPRESSION: No explanation for headache.  No evidence of metastatic disease.

## 2018-08-02 ENCOUNTER — Telehealth: Payer: Self-pay | Admitting: Family Medicine

## 2018-08-02 NOTE — Telephone Encounter (Signed)
Patient called in for the results of her Covid test.  Patient was told Covid was Not Detected.

## 2018-08-23 ENCOUNTER — Encounter (HOSPITAL_COMMUNITY): Payer: Self-pay | Admitting: *Deleted

## 2018-08-23 NOTE — Progress Notes (Signed)
Patient called clinic she is having numbness in her right arm. It happens mostly in the mornings after waking from sleep. She states that it feels asleep most of the time and she occasionally has difficulty holding on to things.    I spoke with Harriet Pho, NP and she states this can be common after breast cancer and sentinel node biopsy.  She wants the patient referred to lymphedema clinic at outpatient rehab center.    I have advised patient that she should get a call with upcoming appts from that clinic.  She verbalizes understanding.

## 2018-09-03 ENCOUNTER — Encounter (HOSPITAL_COMMUNITY): Payer: Self-pay | Admitting: Physical Therapy

## 2018-09-03 ENCOUNTER — Other Ambulatory Visit: Payer: Self-pay

## 2018-09-03 ENCOUNTER — Ambulatory Visit (HOSPITAL_COMMUNITY): Payer: Medicaid Other | Attending: Family Medicine | Admitting: Physical Therapy

## 2018-09-03 DIAGNOSIS — I972 Postmastectomy lymphedema syndrome: Secondary | ICD-10-CM | POA: Diagnosis present

## 2018-09-03 NOTE — Therapy (Signed)
[facility], [place], [address] Phone: [phone]   Fax:  [phone]  Physical Therapy Evaluation  Patient Details  Name: Brittney [name] MRN: [id number] Date of Birth: 1970-07-07 Referring Provider (PT): Virgia Land    Encounter Date: 09/03/2018  PT End of Session - 09/03/18 1350    Visit Number  1    Number of Visits  4    Date for PT Re-Evaluation  09/14/18    Authorization Type  medicaid; visits requested    PT Start Time  5009    PT Stop Time  1353    PT Time Calculation (min)  1358 min    Activity Tolerance  Patient tolerated treatment well    Behavior During Therapy  [facility] for tasks assessed/performed       Past Medical History:  Diagnosis Date  . Breast cancer ([place])   . Breast cancer of upper-outer quadrant of right female breast ([place]) 06/11/2015   Triple negative, 2 cm   . Chemotherapy induced neutropenia ([place]) 09/10/2015  . Diabetes mellitus ([place])    Type 1 over 15 yrs  . Environmental allergies   . GERD (gastroesophageal reflux disease)   . Iron deficiency anemia due to chronic blood loss 06/12/2015  . Personal history of chemotherapy   . Personal history of radiation therapy   . 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 BREAST LUMPECTOMY Right 2017  . CESAREAN SECTION    . COLONOSCOPY N/A 10/09/2013   Procedure: COLONOSCOPY;  Surgeon: Rogene Houston, MD;  Location[facility];  Service: Endoscopy;  Laterality: N/A;  100  . DILATION AND CURETTAGE OF UTERUS     x 2 for miscarriages  . ESOPHAGOGASTRODUODENOSCOPY    . FLEXIBLE SIGMOIDOSCOPY    . FLEXIBLE SIGMOIDOSCOPY  07/27/2011   Procedure: FLEXIBLE SIGMOIDOSCOPY;  Surgeon: Rogene Houston, MD;  Location: [facility];  Service: Endoscopy;  Laterality: N/A;  100    There were no vitals filed for this visit.   Subjective Assessment - 09/03/18 1314    Subjective   Brittney.  [name] states that she is having some numbness and difficulty holding things in her Rt hand following a Rt sentinel node biopsy followed by chemo and radiation treatments in 2017.  She was seen in this clinic in 2018 and was advised to acquire a pump and a compression garment.  She obtained the sleeve but has not used it, she has not obtained the pump.  Her main concern is that her arm feels like it is going to sleep.  This occurs 2-3 x a day worse at night.    Pertinent History  Rt breast cancer, with sentinel node biopsy with chemo(3 rx) and radiation ending in Feb 2018.    Patient Stated Goals  tingling to be gone.    Currently in Pain?  No/denies         [facility] PT Assessment - 09/03/18 0001      Assessment   Medical Diagnosis  RT lymphedema UE     Referring Provider (PT)  Tana Felts Delton Coombes     Onset Date/Surgical Date  06/04/18    Next MD Visit  yearly    Prior Therapy  none       Precautions   Precautions  Other (comment)   lymphedema     Balance Screen   Has the patient fallen in the  past 6 months  No    Has the patient had a decrease in activity level because of a fear of falling?   No    Is the patient reluctant to leave their home because of a fear of falling?   No      Prior Function   Level of Independence  Independent      Cognition   Overall Cognitive Status  Within Functional Limits for tasks assessed      Observation/Other Assessments   Focus on Therapeutic Outcomes (FOTO)   --   Life impact 76       LYMPHEDEMA/ONCOLOGY QUESTIONNAIRE - 09/03/18 1327      Surgeries   Sentinel Lymph Node Biopsy Date  05/13/15      Treatment   Past Chemotherapy Treatment  Yes    Past Radiation Treatment  Yes      What other symptoms do you have   Are you Having Heaviness or Tightness  Yes    Are you having Pain  No    Are you having pitting edema  Yes    Is it Hard or Difficult finding clothes that fit  No      Lymphedema Stage   Stage  STAGE 2 SPONTANEOUSLY  IRREVERSIBLE      Lymphedema Assessments   Lymphedema Assessments  Upper extremities      Right Upper Extremity Lymphedema   15 cm Proximal to Olecranon Process  34.5 cm    10 cm Proximal to Olecranon Process  36.4 cm    Olecranon Process  31.2 cm    15 cm Proximal to Ulnar Styloid Process  30.5 cm    10 cm Proximal to Ulnar Styloid Process  26.8 cm    Just Proximal to Ulnar Styloid Process  18 cm    Across Hand at [employer]  19.8 cm    At [place] of 2nd Digit  5.9 cm    At [facility] of Thumb  7.6 cm      Left Upper Extremity Lymphedema   15 cm Proximal to Olecranon Process  34.5 cm    10 cm Proximal to Olecranon Process  36 cm    Olecranon Process  30.8 cm    15 cm Proximal to Ulnar Styloid Process  30 cm    10 cm Proximal to Ulnar Styloid Process  26 cm    Just Proximal to Ulnar Styloid Process  17.5 cm    Across Hand at [employer]  19 cm    At [place] of 2nd Digit  5.5 cm    At [facility] of Thumb  6.3 cm             Objective measurements completed on examination: See above findings.              PT Education - 09/03/18 1348    Education Details  UE exercises, what lymphedema is and how it is treated and the progressive nature .    Person(s) Educated  Patient    Methods  Explanation    Comprehension  Verbalized understanding;Returned demonstration       PT Short Term Goals - 09/03/18 1413      PT SHORT TERM GOAL #1   Title  PT UE volumes to decrease 1.0 cm to decrease pt complaint of numbing in her Rt UE>    Time  2    Period  Weeks    Status  New    Target Date  09/21/18   rx deferred until next week due to insurance     PT [place] #2   Title  Pt to be I in UE exercises and self manual techniques to allow pt to keep volumes down on her own.    Time  2    Period  Weeks    Status  New                Plan - 09/03/18 1353    Clinical Impression Statement  Brittney [name] is a 48 yo female with a hx of a sentinel node biopsy with chemo  and radiation in 2017.  She has been referred to skilled PT due to lymphedema in her Rt UE. Brittney [name] was seen in this clinic in 2018, the therapist recommended a compression garment, the pt obtained the garment but has never worn it.  At this time Brittney [name] will benefit form manual lymphedema decongestion techniques, education in self manual techniques and the use of compression glove and sleeve.    Examination-Activity Limitations  Other    Examination-Participation Restrictions  Other    Stability/Clinical Decision Making  Evolving/Moderate complexity    Clinical Decision Making  Low    Rehab Potential  Good    PT Frequency  3x / week    PT Duration  2 weeks    PT Treatment/Interventions  ADLs/Self Care Home Management;Manual lymph drainage;Compression bandaging;Other (comment)    PT Next Visit Plan  begin manual lymph drainage and instruction in self techniques.  If pt does not respond may add compression bandaging.    PT Home Exercise Plan  UE exercises to increase lymphatic circulation       Patient will benefit from skilled therapeutic intervention in order to improve the following deficits and impairments:  Increased edema  Visit Diagnosis: Postmastectomy lymphedema     Problem List Patient Active Problem List   Diagnosis Date Noted  . Uncontrolled type 1 diabetes mellitus with hyperglycemia ([place]) 06/04/2018  . Uncontrolled type 2 diabetes mellitus with hyperglycemia ([place]) 03/21/2018  . Neutropenic fever ([place]) 09/11/2015  . Chemotherapy induced neutropenia ([place]) 09/10/2015  . Nausea without vomiting 07/09/2015  . Genetic counseling and testing 06/29/2015  . Iron deficiency anemia due to chronic blood loss 06/12/2015  . Malignant neoplasm of upper-outer quadrant of right female breast ([place]) 06/11/2015  . Bilateral cataracts 05/12/2015  . Type 2 diabetes mellitus without complication ([place]) 82/70/7867  . Gastroparesis 08/26/2013  . THYROID NODULE, RIGHT 04/17/2008  .  Ulcerative colitis ([place]) 11/01/2007  . Type 1 diabetes mellitus ([place]) 10/11/2007  . Mixed hyperlipidemia 10/11/2007  . ANEMIA-NOS 10/11/2007  . Depression 10/11/2007  . GERD 10/11/2007  . PEPTIC ULCER DISEASE 10/11/2007  . HEADACHE 10/11/2007  . COLONIC POLYPS, HX OF 10/11/2007  . UTI'S, HX OF 10/11/2007  . Demetrius Charity OF 10/11/2007   Rayetta Humphrey, PT CLT [phone] 09/03/2018, 2:23 PM  [facility] [address] [place], [place], [address] Phone: [phone]   Fax:  [phone]  Name: Brittney [name] MRN: [id number] Date of Birth: 10-10-1970

## 2018-09-04 ENCOUNTER — Ambulatory Visit: Payer: Medicaid Other | Admitting: "Endocrinology

## 2018-09-05 ENCOUNTER — Encounter (HOSPITAL_COMMUNITY): Payer: Medicaid Other | Admitting: Physical Therapy

## 2018-09-07 ENCOUNTER — Encounter (HOSPITAL_COMMUNITY): Payer: Medicaid Other | Admitting: Physical Therapy

## 2018-09-11 ENCOUNTER — Ambulatory Visit (HOSPITAL_COMMUNITY): Payer: Medicaid Other | Attending: Family Medicine | Admitting: Physical Therapy

## 2018-09-11 ENCOUNTER — Other Ambulatory Visit: Payer: Self-pay

## 2018-09-11 DIAGNOSIS — I890 Lymphedema, not elsewhere classified: Secondary | ICD-10-CM | POA: Diagnosis present

## 2018-09-11 DIAGNOSIS — I972 Postmastectomy lymphedema syndrome: Secondary | ICD-10-CM | POA: Insufficient documentation

## 2018-09-11 NOTE — Therapy (Signed)
[facility] [address] [place], [place], [address] Phone: [phone]   Fax:  [phone]  Physical Therapy Treatment  Patient Details  Name: Brittney [name] MRN: [id number] Date of Birth: 1970-04-01 Referring Provider (PT): Virgia Land    Encounter Date: 09/11/2018  PT End of Session - 09/11/18 1520    Visit Number  2    Number of Visits  4    Date for PT Re-Evaluation  09/14/18    Authorization Type  medicaid approved 3 visit 9/7-9/13    PT Start Time  1440    PT Stop Time  1511    PT Time Calculation (min)  31 min    Activity Tolerance  Patient tolerated treatment well    Behavior During Therapy  [facility] for tasks assessed/performed       Past Medical History:  Diagnosis Date  . Breast cancer ([place])   . Breast cancer of upper-outer quadrant of right female breast ([place]) 06/11/2015   Triple negative, 2 cm   . Chemotherapy induced neutropenia ([place]) 09/10/2015  . Diabetes mellitus ([place])    Type 1 over 15 yrs  . Environmental allergies   . GERD (gastroesophageal reflux disease)   . Iron deficiency anemia due to chronic blood loss 06/12/2015  . Personal history of chemotherapy   . Personal history of radiation therapy   . 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 BREAST LUMPECTOMY Right 2017  . CESAREAN SECTION    . COLONOSCOPY N/A 10/09/2013   Procedure: COLONOSCOPY;  Surgeon: Rogene Houston, MD;  Location[facility];  Service: Endoscopy;  Laterality: N/A;  100  . DILATION AND CURETTAGE OF UTERUS     x 2 for miscarriages  . ESOPHAGOGASTRODUODENOSCOPY    . FLEXIBLE SIGMOIDOSCOPY    . FLEXIBLE SIGMOIDOSCOPY  07/27/2011   Procedure: FLEXIBLE SIGMOIDOSCOPY;  Surgeon: Rogene Houston, MD;  Location[facility];  Service: Endoscopy;  Laterality: N/A;  100    There were no vitals filed for this visit.  Subjective Assessment - 09/11/18 1517    Subjective   Pt states she is doing well today.  STates she has a new compression sleeve but needs to find it.    Currently in Pain?  No/denies                       [facility] Adult PT Treatment/Exercise - 09/11/18 0001      Manual Therapy   Manual Therapy  Manual Lymphatic Drainage (MLD)    Manual Lymphatic Drainage (MLD)  competed to Rt UE anterior and posterior in Lt sidelying             PT Education - 09/11/18 1519    Education Details  compleled during manual lymph drainage.  Educated on need to acquire new garment every 6 months.  Informed that signed order received for new garment we will give her when ready for discharge.    Person(s) Educated  Patient    Methods  Explanation    Comprehension  Verbalized understanding       PT Short Term Goals - 09/03/18 1413      PT SHORT TERM GOAL #1   Title  PT UE volumes to decrease 1.0 cm to decrease pt complaint of numbing in her Rt UE>    Time  2    Period  Weeks  Status  New    Target Date  09/21/18   rx deferred until next week due to insurance     PT [place] #2   Title  Pt to be I in UE exercises and self manual techniques to allow pt to keep volumes down on her own.    Time  2    Period  Weeks    Status  New               Plan - 09/11/18 1527    Clinical Impression Statement  manual lymph draiange initiated this session.  Completed in supine and Lt sidelying to accomodate both anterior andd posterior.  Educated pt on possible reason for increased swelling is failure to purchase new garment after 6 months.  Pt also instucted to find her other "new " garment and wear this one unitl ready for another one.  Informed her of weekly measurements and may need to bandage if does not decongest like it should.  Pt verbalized understanding.    Examination-Activity Limitations  Other    Examination-Participation Restrictions  Other    Stability/Clinical Decision Making  Evolving/Moderate complexity    Rehab  Potential  Good    PT Frequency  3x / week    PT Duration  2 weeks    PT Treatment/Interventions  ADLs/Self Care Home Management;Manual lymph drainage;Compression bandaging;Other (comment)    PT Next Visit Plan  continue manual lymph drainage and instruction in self techniques.  If pt does not respond may add compression bandaging.    PT Home Exercise Plan  UE exercises to increase lymphatic circulation       Patient will benefit from skilled therapeutic intervention in order to improve the following deficits and impairments:  Increased edema  Visit Diagnosis: Postmastectomy lymphedema  Lymphedema, not elsewhere classified     Problem List Patient Active Problem List   Diagnosis Date Noted  . Uncontrolled type 1 diabetes mellitus with hyperglycemia ([place]) 06/04/2018  . Uncontrolled type 2 diabetes mellitus with hyperglycemia ([place]) 03/21/2018  . Neutropenic fever ([place]) 09/11/2015  . Chemotherapy induced neutropenia ([place]) 09/10/2015  . Nausea without vomiting 07/09/2015  . Genetic counseling and testing 06/29/2015  . Iron deficiency anemia due to chronic blood loss 06/12/2015  . Malignant neoplasm of upper-outer quadrant of right female breast ([place]) 06/11/2015  . Bilateral cataracts 05/12/2015  . Type 2 diabetes mellitus without complication ([place]) 09/47/0962  . Gastroparesis 08/26/2013  . THYROID NODULE, RIGHT 04/17/2008  . Ulcerative colitis ([place]) 11/01/2007  . Type 1 diabetes mellitus ([place]) 10/11/2007  . Mixed hyperlipidemia 10/11/2007  . ANEMIA-NOS 10/11/2007  . Depression 10/11/2007  . GERD 10/11/2007  . PEPTIC ULCER DISEASE 10/11/2007  . HEADACHE 10/11/2007  . COLONIC POLYPS, HX OF 10/11/2007  . UTI'S, HX OF 10/11/2007  . CHICKENPOX, HX OF 10/11/2007   Teena Irani, PTA/CLT [phone]  Teena Irani 09/11/2018, 3:30 PM  [facility] [place], [place], [address] Phone: [phone]   Fax:   [phone]  Name: Brittney [name] MRN: [id number] Date of Birth: 10/11/1970

## 2018-09-13 ENCOUNTER — Ambulatory Visit (HOSPITAL_COMMUNITY): Payer: Medicaid Other | Admitting: Physical Therapy

## 2018-09-13 ENCOUNTER — Telehealth (HOSPITAL_COMMUNITY): Payer: Self-pay | Admitting: Physical Therapy

## 2018-09-13 NOTE — Telephone Encounter (Signed)
She is having work done on her car and they are still working on it - Lack of transportation-r/s for Friday.

## 2018-09-14 ENCOUNTER — Other Ambulatory Visit: Payer: Self-pay

## 2018-09-14 ENCOUNTER — Ambulatory Visit (HOSPITAL_COMMUNITY): Payer: Medicaid Other | Admitting: Physical Therapy

## 2018-09-14 ENCOUNTER — Encounter (HOSPITAL_COMMUNITY): Payer: Self-pay | Admitting: Physical Therapy

## 2018-09-14 DIAGNOSIS — I890 Lymphedema, not elsewhere classified: Secondary | ICD-10-CM

## 2018-09-14 DIAGNOSIS — I972 Postmastectomy lymphedema syndrome: Secondary | ICD-10-CM | POA: Diagnosis not present

## 2018-09-14 NOTE — Therapy (Signed)
[facility], [place], [address] Phone: [phone]   Fax:  [phone]  Physical Therapy Treatment  Patient Details  Name: Brittney [name] MRN: [id number] Date of Birth: October 24, 1970 Referring Provider (PT): Virgia Land    Encounter Date: 09/14/2018  PT End of Session - 09/14/18 1253    Visit Number  3    Number of Visits  4    Date for PT Re-Evaluation  09/14/18    Authorization Type  medicaid approved 3 visit 9/7-9/13    PT Start Time  0830    PT Stop Time  0915    PT Time Calculation (min)  45 min    Activity Tolerance  Patient tolerated treatment well    Behavior During Therapy  [facility] for tasks assessed/performed       Past Medical History:  Diagnosis Date  . Breast cancer ([place])   . Breast cancer of upper-outer quadrant of right female breast ([place]) 06/11/2015   Triple negative, 2 cm   . Chemotherapy induced neutropenia ([place]) 09/10/2015  . Diabetes mellitus ([place])    Type 1 over 15 yrs  . Environmental allergies   . GERD (gastroesophageal reflux disease)   . Iron deficiency anemia due to chronic blood loss 06/12/2015  . Personal history of chemotherapy   . Personal history of radiation therapy   . 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 BREAST LUMPECTOMY Right 2017  . CESAREAN SECTION    . COLONOSCOPY N/A 10/09/2013   Procedure: COLONOSCOPY;  Surgeon: Rogene Houston, MD;  Location[facility];  Service: Endoscopy;  Laterality: N/A;  100  . DILATION AND CURETTAGE OF UTERUS     x 2 for miscarriages  . ESOPHAGOGASTRODUODENOSCOPY    . FLEXIBLE SIGMOIDOSCOPY    . FLEXIBLE SIGMOIDOSCOPY  07/27/2011   Procedure: FLEXIBLE SIGMOIDOSCOPY;  Surgeon: Rogene Houston, MD;  Location: [facility];  Service: Endoscopy;  Laterality: N/A;  100    There were no vitals filed for this visit.  Subjective Assessment - 09/14/18 1249    Subjective   Pt comes to department with sleeve and glove donned as she was able to find it.  Pt does not have a pump, nor does she have a Reidsleeve.  PT states at this time she does not feel much change.    Pertinent History  Rt breast cancer, with sentinel node biopsy with chemo(3 rx) and radiation ending in Feb 2018.    Patient Stated Goals  tingling to be gone.    Currently in Pain?  No/denies            LYMPHEDEMA/ONCOLOGY QUESTIONNAIRE - 09/14/18 0838      Right Upper Extremity Lymphedema   15 cm Proximal to Olecranon Process  34.5 cm    10 cm Proximal to Olecranon Process  36.8 cm    Olecranon Process  31 cm    15 cm Proximal to Ulnar Styloid Process  28.8 cm    10 cm Proximal to Ulnar Styloid Process  25.3 cm    Just Proximal to Ulnar Styloid Process  18 cm    Across Hand at [employer]  18.5 cm    At [place] of 2nd Digit  5.4 cm    At [facility] of Thumb  6.9 cm                [facility]  Adult PT Treatment/Exercise - 09/14/18 0001      Manual Therapy   Manual Therapy  Manual Lymphatic Drainage (MLD)    Manual Lymphatic Drainage (MLD)  To include supraclavicular, deep and superfical abdominal, routing using Rt axillary/inguinal and inter-axillary anastomosis; competed to Rt UE anterior and posterior in Lt sidelying               PT Short Term Goals - 09/14/18 1257      PT SHORT TERM GOAL #1   Title  PT UE volumes to decrease 1.0 cm to decrease pt complaint of numbing in her Rt UE>    Time  2    Period  Weeks    Status  On-going    Target Date  09/21/18   rx deferred until next week due to insurance     PT [place] #2   Title  Pt to be I in UE exercises and self manual techniques to allow pt to keep volumes down on her own.    Time  2    Period  Weeks    Status  Partially Met   doing exercises has not been instructed in manual yet              Plan - 09/14/18 1254    Clinical Impression Statement  Pt has found and is using her new sleeve.  Pt  does not have a pump or a reidsleeve, since sx are mainly at night therapist sent an order to MD for garment, pump and reidsleeves.  Pt measured with mixed results but pt has only had one session as pt was measured prior to manual this session.  Pt will continue to benefit from skilled care to understand how to control her lymphedema and learn self manual techniques.    Examination-Activity Limitations  Other    Examination-Participation Restrictions  Other    Stability/Clinical Decision Making  Evolving/Moderate complexity    Rehab Potential  Good    PT Frequency  3x / week    PT Duration  2 weeks    PT Treatment/Interventions  ADLs/Self Care Home Management;Manual lymph drainage;Compression bandaging;Other (comment)    PT Next Visit Plan  continue manual lymph drainage and instruction in self techniques.  If pt does not respond may add compression bandaging.    PT Home Exercise Plan  UE exercises to increase lymphatic circulation       Patient will benefit from skilled therapeutic intervention in order to improve the following deficits and impairments:  Increased edema  Visit Diagnosis: Postmastectomy lymphedema  Lymphedema, not elsewhere classified     Problem List Patient Active Problem List   Diagnosis Date Noted  . Uncontrolled type 1 diabetes mellitus with hyperglycemia ([place]) 06/04/2018  . Uncontrolled type 2 diabetes mellitus with hyperglycemia ([place]) 03/21/2018  . Neutropenic fever ([place]) 09/11/2015  . Chemotherapy induced neutropenia ([place]) 09/10/2015  . Nausea without vomiting 07/09/2015  . Genetic counseling and testing 06/29/2015  . Iron deficiency anemia due to chronic blood loss 06/12/2015  . Malignant neoplasm of upper-outer quadrant of right female breast ([place]) 06/11/2015  . Bilateral cataracts 05/12/2015  . Type 2 diabetes mellitus without complication ([place]) 27/25/3664  . Gastroparesis 08/26/2013  . THYROID NODULE, RIGHT 04/17/2008  . Ulcerative colitis ([place])  11/01/2007  . Type 1 diabetes mellitus ([place]) 10/11/2007  . Mixed hyperlipidemia 10/11/2007  . ANEMIA-NOS 10/11/2007  . Depression 10/11/2007  . GERD 10/11/2007  . PEPTIC ULCER DISEASE 10/11/2007  . HEADACHE 10/11/2007  .  COLONIC POLYPS, HX OF 10/11/2007  . UTI'S, HX OF 10/11/2007  . Demetrius Charity OF 10/11/2007    Rayetta Humphrey, PT CLT [phone] 09/14/2018, 12:59 PM  [facility] [address] [place], [place], [address] Phone: [phone]   Fax:  [phone]  Name: Brittney [name] MRN: [id number] Date of Birth: [birth year]

## 2018-09-15 LAB — COMPLETE METABOLIC PANEL WITH GFR
AG Ratio: 2.3 (calc) (ref 1.0–2.5)
ALT: 19 U/L (ref 6–29)
AST: 18 U/L (ref 10–35)
Albumin: 4.4 g/dL (ref 3.6–5.1)
Alkaline phosphatase (APISO): 87 U/L (ref 31–125)
BUN: 8 mg/dL (ref 7–25)
CO2: 30 mmol/L (ref 20–32)
Calcium: 9.2 mg/dL (ref 8.6–10.2)
Chloride: 104 mmol/L (ref 98–110)
Creat: 0.57 mg/dL (ref 0.50–1.10)
GFR, Est African American: 129 mL/min/{1.73_m2} (ref >=60)
GFR, Est Non African American: 111 mL/min/{1.73_m2} (ref >=60)
Globulin: 1.9 g/dL (calc) (ref 1.9–3.7)
Glucose, Bld: 277 mg/dL — ABNORMAL HIGH (ref 65–99)
Potassium: 4.7 mmol/L (ref 3.5–5.3)
Sodium: 140 mmol/L (ref 135–146)
Total Bilirubin: 0.9 mg/dL (ref 0.2–1.2)
Total Protein: 6.3 g/dL (ref 6.1–8.1)

## 2018-09-15 LAB — HEMOGLOBIN A1C
Hgb A1c MFr Bld: 7.7 % of total Hgb — ABNORMAL HIGH (ref <5.7)
Mean Plasma Glucose: 174 (calc)
eAG (mmol/L): 9.7 (calc)

## 2018-09-17 ENCOUNTER — Telehealth (HOSPITAL_COMMUNITY): Payer: Self-pay | Admitting: Family Medicine

## 2018-09-17 ENCOUNTER — Ambulatory Visit (HOSPITAL_COMMUNITY): Payer: Medicaid Other | Admitting: Physical Therapy

## 2018-09-17 NOTE — Telephone Encounter (Signed)
09/17/18  called to cx said that her daughter is sick and is going to try and schedule her an appt with the dr.

## 2018-09-19 ENCOUNTER — Ambulatory Visit (HOSPITAL_COMMUNITY): Payer: Medicaid Other | Admitting: Physical Therapy

## 2018-09-19 ENCOUNTER — Other Ambulatory Visit: Payer: Self-pay

## 2018-09-19 DIAGNOSIS — I972 Postmastectomy lymphedema syndrome: Secondary | ICD-10-CM | POA: Diagnosis not present

## 2018-09-19 DIAGNOSIS — I890 Lymphedema, not elsewhere classified: Secondary | ICD-10-CM

## 2018-09-19 NOTE — Therapy (Signed)
[place], [place], [address] Phone: [phone]   Fax:  [phone]  Physical Therapy Treatment  Patient Details  Name: Brittney [name] MRN: [id number] Date of Birth: May 28, 1970 Referring Provider (PT): Virgia Land    Encounter Date: 09/19/2018  PT End of Session - 09/19/18 1702    Visit Number  4    Number of Visits  13    Date for PT Re-Evaluation  09/14/18    Authorization Type  medicaid approved 3 visit 9/7-9/13, approved 9 visits 9/15-10/5    Authorization - Visit Number  1    Authorization - Number of Visits  9    PT Start Time  1622    PT Stop Time  1700    PT Time Calculation (min)  38 min    Activity Tolerance  Patient tolerated treatment well    Behavior During Therapy  [facility] for tasks assessed/performed       Past Medical History:  Diagnosis Date  . Breast cancer ([place])   . Breast cancer of upper-outer quadrant of right female breast ([place]) 06/11/2015   Triple negative, 2 cm   . Chemotherapy induced neutropenia ([place]) 09/10/2015  . Diabetes mellitus ([place])    Type 1 over 15 yrs  . Environmental allergies   . GERD (gastroesophageal reflux disease)   . Iron deficiency anemia due to chronic blood loss 06/12/2015  . Personal history of chemotherapy   . Personal history of radiation therapy   . 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 BREAST LUMPECTOMY Right 2017  . CESAREAN SECTION    . COLONOSCOPY N/A 10/09/2013   Procedure: COLONOSCOPY;  Surgeon: Rogene Houston, MD;  [facility];  Service: Endoscopy;  Laterality: N/A;  100  . DILATION AND CURETTAGE OF UTERUS     x 2 for miscarriages  . ESOPHAGOGASTRODUODENOSCOPY    . FLEXIBLE SIGMOIDOSCOPY    . FLEXIBLE SIGMOIDOSCOPY  07/27/2011   Procedure: FLEXIBLE SIGMOIDOSCOPY;  Surgeon: Rogene Houston, MD;  [facility];  Service: Endoscopy;  Laterality: N/A;   100    There were no vitals filed for this visit.  Subjective Assessment - 09/19/18 1700    Subjective  pt states she has been voiding more and continues to wear her sleeve.    Currently in Pain?  No/denies                       [facility] Adult PT Treatment/Exercise - 09/19/18 0001      Manual Therapy   Manual Therapy  Manual Lymphatic Drainage (MLD)    Manual Lymphatic Drainage (MLD)  To include supraclavicular, deep and superfical abdominal, routing using Rt axillary/inguinal and inter-axillary anastomosis; competed to Rt UE anterior and posterior in Lt sidelying               PT Short Term Goals - 09/14/18 1257      PT SHORT TERM GOAL #1   Title  PT UE volumes to decrease 1.0 cm to decrease pt complaint of numbing in her Rt UE>    Time  2    Period  Weeks    Status  On-going    Target Date  09/21/18   rx deferred until next week due to insurance     PT [place] #2   Title  Pt to be I  in UE exercises and self manual techniques to allow pt to keep volumes down on her own.    Time  2    Period  Weeks    Status  Partially Met   doing exercises has not been instructed in manual yet              Plan - 09/19/18 1704    Clinical Impression Statement  continued with manual lymph drainage for Rt UE.  Have not gotten signed order for reidsleeve, pump or garment.  Pt will need to be measured next session.    Examination-Activity Limitations  Other    Examination-Participation Restrictions  Other    Stability/Clinical Decision Making  Evolving/Moderate complexity    Rehab Potential  Good    PT Frequency  3x / week    PT Duration  2 weeks    PT Treatment/Interventions  ADLs/Self Care Home Management;Manual lymph drainage;Compression bandaging;Other (comment)    PT Next Visit Plan  continue manual lymph drainage and instruction in self techniques.  Remeasure next visit and check on signed orders (resend if not back yet).    PT Home Exercise Plan   UE exercises to increase lymphatic circulation       Patient will benefit from skilled therapeutic intervention in order to improve the following deficits and impairments:  Increased edema  Visit Diagnosis: Postmastectomy lymphedema  Lymphedema, not elsewhere classified     Problem List Patient Active Problem List   Diagnosis Date Noted  . Uncontrolled type 1 diabetes mellitus with hyperglycemia ([place]) 06/04/2018  . Uncontrolled type 2 diabetes mellitus with hyperglycemia ([place]) 03/21/2018  . Neutropenic fever ([place]) 09/11/2015  . Chemotherapy induced neutropenia ([place]) 09/10/2015  . Nausea without vomiting 07/09/2015  . Genetic counseling and testing 06/29/2015  . Iron deficiency anemia due to chronic blood loss 06/12/2015  . Malignant neoplasm of upper-outer quadrant of right female breast ([place]) 06/11/2015  . Bilateral cataracts 05/12/2015  . Type 2 diabetes mellitus without complication ([place]) 01/05/7251  . Gastroparesis 08/26/2013  . THYROID NODULE, RIGHT 04/17/2008  . Ulcerative colitis ([place]) 11/01/2007  . Type 1 diabetes mellitus ([place]) 10/11/2007  . Mixed hyperlipidemia 10/11/2007  . ANEMIA-NOS 10/11/2007  . Depression 10/11/2007  . GERD 10/11/2007  . PEPTIC ULCER DISEASE 10/11/2007  . HEADACHE 10/11/2007  . COLONIC POLYPS, HX OF 10/11/2007  . UTI'S, HX OF 10/11/2007  . CHICKENPOX, HX OF 10/11/2007   Teena Irani, PTA/CLT [phone]  Teena Irani 09/19/2018, 5:06 PM  [place] [address] [place], [place], [address] Phone: [phone]   Fax:  [phone]  Name: Brittney [name] MRN: [id number] Date of Birth: 10/06/1970

## 2018-09-21 ENCOUNTER — Ambulatory Visit (HOSPITAL_COMMUNITY): Payer: Medicaid Other | Admitting: Physical Therapy

## 2018-09-21 ENCOUNTER — Other Ambulatory Visit: Payer: Self-pay

## 2018-09-21 DIAGNOSIS — I972 Postmastectomy lymphedema syndrome: Secondary | ICD-10-CM

## 2018-09-21 NOTE — Therapy (Signed)
[place], [place], [address] Phone: [phone]   Fax:  [phone]  Physical Therapy Treatment  Patient Details  Name: Brittney [name] MRN: [id number] Date of Birth: 08-Sep-1970 Referring Provider (PT): Virgia Land    Encounter Date: 09/21/2018  PT End of Session - 09/21/18 1532    Visit Number  5    Number of Visits  13    Date for PT Re-Evaluation  09/14/18    Authorization Type  medicaid approved 3 visit 9/7-9/13, approved 9 visits 9/15-10/5    Authorization - Visit Number  2    Authorization - Number of Visits  9    PT Start Time  0630    PT Stop Time  1533    PT Time Calculation (min)  41 min    Activity Tolerance  Patient tolerated treatment well    Behavior During Therapy  [facility] for tasks assessed/performed       Past Medical History:  Diagnosis Date  . Breast cancer ([place])   . Breast cancer of upper-outer quadrant of right female breast ([place]) 06/11/2015   Triple negative, 2 cm   . Chemotherapy induced neutropenia ([place]) 09/10/2015  . Diabetes mellitus ([place])    Type 1 over 15 yrs  . Environmental allergies   . GERD (gastroesophageal reflux disease)   . Iron deficiency anemia due to chronic blood loss 06/12/2015  . Personal history of chemotherapy   . Personal history of radiation therapy   . 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 BREAST LUMPECTOMY Right 2017  . CESAREAN SECTION    . COLONOSCOPY N/A 10/09/2013   Procedure: COLONOSCOPY;  Surgeon: Rogene Houston, MD;  [facility];  Service: Endoscopy;  Laterality: N/A;  100  . DILATION AND CURETTAGE OF UTERUS     x 2 for miscarriages  . ESOPHAGOGASTRODUODENOSCOPY    . FLEXIBLE SIGMOIDOSCOPY    . FLEXIBLE SIGMOIDOSCOPY  07/27/2011   Procedure: FLEXIBLE SIGMOIDOSCOPY;  Surgeon: Rogene Houston, MD;  [facility];  Service: Endoscopy;  Laterality: N/A;   100    There were no vitals filed for this visit.  Subjective Assessment - 09/21/18 1531    Subjective  PT states that her hand feels tingling but the arm feels better. She has been very busy over the last few days with housworkl    Pertinent History  Rt breast cancer, with sentinel node biopsy with chemo(3 rx) and radiation ending in Feb 2018.    Patient Stated Goals  tingling to be gone.            LYMPHEDEMA/ONCOLOGY QUESTIONNAIRE - 09/21/18 1456      Right Upper Extremity Lymphedema   15 cm Proximal to Olecranon Process  35.5 cm    10 cm Proximal to Olecranon Process  37.3 cm    Olecranon Process  31.3 cm    15 cm Proximal to Ulnar Styloid Process  29.9 cm    10 cm Proximal to Ulnar Styloid Process  26.2 cm    Just Proximal to Ulnar Styloid Process  18.5 cm    Across Hand at [employer]  20 cm    At [place] of 2nd Digit  6 cm    At [facility] of Thumb  7.2 cm                [facility] Adult PT  Treatment/Exercise - 09/21/18 0001      Manual Therapy   Manual Therapy  Manual Lymphatic Drainage (MLD)    Manual Lymphatic Drainage (MLD)  To include supraclavicular, deep and superfical abdominal, routing using Rt axillary/inguinal and inter-axillary anastomosis; competed to Rt UE anterior and posterior in Lt sidelying             PT Education - 09/21/18 1532    Education Details  self manual    Person(s) Educated  Patient    Methods  Explanation;Demonstration;Handout    Comprehension  Verbalized understanding       PT Short Term Goals - 09/14/18 1257      PT SHORT TERM GOAL #1   Title  PT UE volumes to decrease 1.0 cm to decrease pt complaint of numbing in her Rt UE>    Time  2    Period  Weeks    Status  On-going    Target Date  09/21/18   rx deferred until next week due to insurance     PT [place] #2   Title  Pt to be I in UE exercises and self manual techniques to allow pt to keep volumes down on her own.    Time  2    Period  Weeks    Status   Partially Met   doing exercises has not been instructed in manual yet              Plan - 09/21/18 1533    Clinical Impression Statement  Pt prescription came back from [place] and was sent to [facility], Pt instructed in self manual techniques.  Pt remeasured with noted increase in volume.  Pt encouraged to complete self manual daily.    Examination-Activity Limitations  Other    Examination-Participation Restrictions  Other    Stability/Clinical Decision Making  Evolving/Moderate complexity    Rehab Potential  Good    PT Frequency  3x / week    PT Duration  2 weeks    PT Treatment/Interventions  ADLs/Self Care Home Management;Manual lymph drainage;Compression bandaging;Other (comment)    PT Next Visit Plan  reveiw self manual continue with lymph drainage.    PT Home Exercise Plan  UE exercises to increase lymphatic circulation       Patient will benefit from skilled therapeutic intervention in order to improve the following deficits and impairments:  Increased edema  Visit Diagnosis: Postmastectomy lymphedema     Problem List Patient Active Problem List   Diagnosis Date Noted  . Uncontrolled type 1 diabetes mellitus with hyperglycemia ([place]) 06/04/2018  . Uncontrolled type 2 diabetes mellitus with hyperglycemia ([place]) 03/21/2018  . Neutropenic fever ([place]) 09/11/2015  . Chemotherapy induced neutropenia ([place]) 09/10/2015  . Nausea without vomiting 07/09/2015  . Genetic counseling and testing 06/29/2015  . Iron deficiency anemia due to chronic blood loss 06/12/2015  . Malignant neoplasm of upper-outer quadrant of right female breast ([place]) 06/11/2015  . Bilateral cataracts 05/12/2015  . Type 2 diabetes mellitus without complication ([place]) 45/80/9983  . Gastroparesis 08/26/2013  . THYROID NODULE, RIGHT 04/17/2008  . Ulcerative colitis ([place]) 11/01/2007  . Type 1 diabetes mellitus ([place]) 10/11/2007  . Mixed hyperlipidemia 10/11/2007  . ANEMIA-NOS 10/11/2007  . Depression 10/11/2007   . GERD 10/11/2007  . PEPTIC ULCER DISEASE 10/11/2007  . HEADACHE 10/11/2007  . COLONIC POLYPS, HX OF 10/11/2007  . UTI'S, HX OF 10/11/2007  . CHICKENPOX, HX OF 10/11/2007    Rayetta Humphrey, [place] CLT [phone] 09/21/2018,  3:35 PM  [place], [place], [address] Phone: [phone]   Fax:  [phone]  Name: Brittney [name] MRN: [id number] Date of Birth: 1970/10/17

## 2018-09-24 ENCOUNTER — Other Ambulatory Visit: Payer: Self-pay

## 2018-09-24 ENCOUNTER — Encounter (HOSPITAL_COMMUNITY): Payer: Self-pay | Admitting: Physical Therapy

## 2018-09-24 ENCOUNTER — Ambulatory Visit (HOSPITAL_COMMUNITY): Payer: Medicaid Other | Admitting: Physical Therapy

## 2018-09-24 DIAGNOSIS — I972 Postmastectomy lymphedema syndrome: Secondary | ICD-10-CM | POA: Diagnosis not present

## 2018-09-24 NOTE — Therapy (Signed)
[place], [place], [address] Phone: [phone]   Fax:  [phone]  Physical Therapy Treatment  Patient Details  Name: Brittney [name] MRN: [id number] Date of Birth: 09/27/1970 Referring Provider (PT): Virgia Land    Encounter Date: 09/24/2018  PT End of Session - 09/24/18 1532    Visit Number  6    Number of Visits  13    Date for PT Re-Evaluation  09/14/18    Authorization Type  medicaid approved 3 visit 9/7-9/13, approved 9 visits 9/15-10/5    Authorization - Visit Number  6    Authorization - Number of Visits  13    PT Start Time  1500   pt late   PT Stop Time  1530    PT Time Calculation (min)  30 min    Activity Tolerance  Patient tolerated treatment well    Behavior During Therapy  [facility] for tasks assessed/performed       Past Medical History:  Diagnosis Date  . Breast cancer ([place])   . Breast cancer of upper-outer quadrant of right female breast ([place]) 06/11/2015   Triple negative, 2 cm   . Chemotherapy induced neutropenia ([place]) 09/10/2015  . Diabetes mellitus ([place])    Type 1 over 15 yrs  . Environmental allergies   . GERD (gastroesophageal reflux disease)   . Iron deficiency anemia due to chronic blood loss 06/12/2015  . Personal history of chemotherapy   . Personal history of radiation therapy   . 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 BREAST LUMPECTOMY Right 2017  . CESAREAN SECTION    . COLONOSCOPY N/A 10/09/2013   Procedure: COLONOSCOPY;  Surgeon: Rogene Houston, MD;  [facility];  Service: Endoscopy;  Laterality: N/A;  100  . DILATION AND CURETTAGE OF UTERUS     x 2 for miscarriages  . ESOPHAGOGASTRODUODENOSCOPY    . FLEXIBLE SIGMOIDOSCOPY    . FLEXIBLE SIGMOIDOSCOPY  07/27/2011   Procedure: FLEXIBLE SIGMOIDOSCOPY;  Surgeon: Rogene Houston, MD;  [facility];  Service: Endoscopy;   Laterality: N/A;  100    There were no vitals filed for this visit.  Subjective Assessment - 09/24/18 1531    Subjective  Pt states that she went over the manual that she was taught last session with no questions    Pertinent History  Rt breast cancer, with sentinel node biopsy with chemo(3 rx) and radiation ending in Feb 2018.    Patient Stated Goals  tingling to be gone.                       [facility] Adult PT Treatment/Exercise - 09/24/18 0001      Manual Therapy   Manual Therapy  Manual Lymphatic Drainage (MLD)    Manual Lymphatic Drainage (MLD)  To include supraclavicular, deep and superfical abdominal, routing using Rt axillary/inguinal and inter-axillary anastomosis; competed to Rt UE anterior and posterior in Lt sidelying             PT Education - 09/24/18 1531    Education Details  Completed all self manual without questions    Person(s) Educated  Patient    Methods  Explanation;Handout;Demonstration    Comprehension  Verbalized understanding;Returned demonstration       PT Short Term Goals - 09/14/18 1257      PT SHORT TERM GOAL #  1   Title  PT UE volumes to decrease 1.0 cm to decrease pt complaint of numbing in her Rt UE>    Time  2    Period  Weeks    Status  On-going    Target Date  09/21/18   rx deferred until next week due to insurance     PT [place] #2   Title  Pt to be I in UE exercises and self manual techniques to allow pt to keep volumes down on her own.    Time  2    Period  Weeks    Status  Partially Met   doing exercises has not been instructed in manual yet              Plan - 09/24/18 1533    Clinical Impression Statement  PT continues to have induration in arm.  Pt waiting for [facility] to contact her.  Instructed in self massage with no questions.    Examination-Activity Limitations  Other    Examination-Participation Restrictions  Other    Stability/Clinical Decision Making  Evolving/Moderate complexity     Rehab Potential  Good    PT Frequency  3x / week    PT Duration  2 weeks    PT Treatment/Interventions  ADLs/Self Care Home Management;Manual lymph drainage;Compression bandaging;Other (comment)    PT Next Visit Plan  reveiw self manual continue with lymph drainage.    PT Home Exercise Plan  UE exercises to increase lymphatic circulation       Patient will benefit from skilled therapeutic intervention in order to improve the following deficits and impairments:  Increased edema  Visit Diagnosis: Postmastectomy lymphedema     Problem List Patient Active Problem List   Diagnosis Date Noted  . Uncontrolled type 1 diabetes mellitus with hyperglycemia ([place]) 06/04/2018  . Uncontrolled type 2 diabetes mellitus with hyperglycemia ([place]) 03/21/2018  . Neutropenic fever ([place]) 09/11/2015  . Chemotherapy induced neutropenia ([place]) 09/10/2015  . Nausea without vomiting 07/09/2015  . Genetic counseling and testing 06/29/2015  . Iron deficiency anemia due to chronic blood loss 06/12/2015  . Malignant neoplasm of upper-outer quadrant of right female breast ([place]) 06/11/2015  . Bilateral cataracts 05/12/2015  . Type 2 diabetes mellitus without complication ([place]) 60/45/4098  . Gastroparesis 08/26/2013  . THYROID NODULE, RIGHT 04/17/2008  . Ulcerative colitis ([place]) 11/01/2007  . Type 1 diabetes mellitus ([place]) 10/11/2007  . Mixed hyperlipidemia 10/11/2007  . ANEMIA-NOS 10/11/2007  . Depression 10/11/2007  . GERD 10/11/2007  . PEPTIC ULCER DISEASE 10/11/2007  . HEADACHE 10/11/2007  . COLONIC POLYPS, HX OF 10/11/2007  . UTI'S, HX OF 10/11/2007  . Demetrius Charity OF 10/11/2007    Rayetta Humphrey, PT CLT [phone] 09/24/2018, 3:34 PM  [place] [address] [place], [place], [address] Phone: [phone]   Fax:  [phone]  Name: Brittney [name] MRN: [id number] Date of Birth: 1970/01/26

## 2018-09-25 ENCOUNTER — Ambulatory Visit (INDEPENDENT_AMBULATORY_CARE_PROVIDER_SITE_OTHER): Payer: Medicaid Other | Admitting: Internal Medicine

## 2018-09-26 ENCOUNTER — Telehealth (HOSPITAL_COMMUNITY): Payer: Self-pay | Admitting: Physical Therapy

## 2018-09-26 ENCOUNTER — Other Ambulatory Visit: Payer: Self-pay

## 2018-09-26 ENCOUNTER — Ambulatory Visit (HOSPITAL_COMMUNITY): Payer: Medicaid Other | Admitting: Physical Therapy

## 2018-09-26 DIAGNOSIS — I972 Postmastectomy lymphedema syndrome: Secondary | ICD-10-CM | POA: Diagnosis not present

## 2018-09-26 DIAGNOSIS — I890 Lymphedema, not elsewhere classified: Secondary | ICD-10-CM

## 2018-09-26 NOTE — Telephone Encounter (Signed)
Contacted jason at [employer] who states they have attempted contact with patient and left messages to return call.  Patient states she is unaware of this unless they called another number and left a VM there, she will check.  Additional noted faxed to [facility] per request including evaluation and last 2 PN's.    Teena Irani, PTA/CLT [phone]

## 2018-09-26 NOTE — Therapy (Signed)
[place], [place], [address] Phone: [phone]   Fax:  [phone]  Physical Therapy Treatment  Patient Details  Name: Brittney [name] MRN: [id number] Date of Birth: 1970/11/16 Referring Provider (PT): Virgia Land    Encounter Date: 09/26/2018  PT End of Session - 09/26/18 1628    Visit Number  7    Number of Visits  13    Date for PT Re-Evaluation  09/14/18    Authorization Type  medicaid approved 3 visit 9/7-9/13, approved 9 visits 9/15-10/5    Authorization - Visit Number  7    Authorization - Number of Visits  13    PT Start Time  1500   pt was 15 minutes late   PT Stop Time  1530    PT Time Calculation (min)  30 min    Activity Tolerance  Patient tolerated treatment well    Behavior During Therapy  [facility] for tasks assessed/performed       Past Medical History:  Diagnosis Date  . Breast cancer ([place])   . Breast cancer of upper-outer quadrant of right female breast ([place]) 06/11/2015   Triple negative, 2 cm   . Chemotherapy induced neutropenia ([place]) 09/10/2015  . Diabetes mellitus ([place])    Type 1 over 15 yrs  . Environmental allergies   . GERD (gastroesophageal reflux disease)   . Iron deficiency anemia due to chronic blood loss 06/12/2015  . Personal history of chemotherapy   . Personal history of radiation therapy   . 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) [facility])     Past Surgical History:  Procedure Laterality Date  . BREAST LUMPECTOMY Right 2017  . CESAREAN SECTION    . COLONOSCOPY N/A 10/09/2013   Procedure: COLONOSCOPY;  Surgeon: Rogene [place], MD;  Location: [facility] ENDO SU[facility];  Service: Endoscopy;  Laterality: N/A;  100  . DILATION AND CURETTAGE OF UTERUS     x 2 for miscarriages  . ESOPHAGOGASTRODUODENOSCOPY    . FLEXIBLE SIGMOIDOSCOPY    . FLEXIBLE SIGMOIDOSCOPY  07/27/2011   Procedure: FLEXIBLE SIGMOIDOSCOPY;  Surgeon: Rogene [place], MD;  Location: [facility] ENDO SUITE;  Service:  Endoscopy;  Laterality: N/A;  100    There were no vitals filed for this visit.  Subjective Assessment - 09/26/18 1620    Subjective  pt states she has not heard from [facility], PT to follow up.  STates it feels a little better today than last week but still heavy    Currently in Pain?  No/denies            LYMPHEDEMA/ONCOLOGY QUESTIONNAIRE - 09/26/18 1622      Surgeries   Sentinel Lymph Node Biopsy Date  --      Treatment   Past Chemotherapy Treatment  --    Past Radiation Treatment  --      What other symptoms do you have   Are you Having Heaviness or Tightness  --    Are you having Pain  --    Are you having pitting edema  --    Is it Hard or Difficult finding clothes that fit  --      Lymphedema Stage   Stage  STAGE 2 SPONTANEOUSLY IRREVERSIBLE      Lymphedema Assessments   Lymphedema Assessments  Upper extremities      Right Upper Extremity Lymphedema   15 cm Proximal to Olecranon Process  34.8 cm   was 34.5 cm on 8/31  10 cm Proximal to Olecranon Process  36 cm   was 36.4   Olecranon Process  29.5 cm   was 31.2   15 cm Proximal to Ulnar Styloid Process  30 cm   was 30.5   10 cm Proximal to Ulnar Styloid Process  27 cm   was 26.8   Just Proximal to Ulnar Styloid Process  18 cm   was 18   Across Hand at [employer]  18.3 cm   was 19.8   At [place] of 2nd Digit  5.8 cm   was 5.9   At [facility] of Thumb  6 cm   was 7.6     Left Upper Extremity Lymphedema   15 cm Proximal to Olecranon Process  34.5 cm   was 34.5 on 8/31   10 cm Proximal to Olecranon Process  36 cm   was 36   Olecranon Process  30.8 cm   was 30.8   15 cm Proximal to Ulnar Styloid Process  30 cm   was 30   10 cm Proximal to Ulnar Styloid Process  26 cm   was 26   Just Proximal to Ulnar Styloid Process  17.5 cm   was 17.5   Across Hand at [employer]  19 cm   was 19   At [place] of 2nd Digit  5.5 cm   was 5.5   At [facility] of Thumb  6 cm   was 6.3               [facility] Adult  PT Treatment/Exercise - 09/26/18 0001      Manual Therapy   Manual Therapy  Manual Lymphatic Drainage (MLD);Other (comment)    Manual Lymphatic Drainage (MLD)  To include supraclavicular, deep and superfical abdominal, routing using Rt axillary/inguinal and inter-axillary anastomosis; competed to Rt UE anterior and posterior in Lt sidelying    Other Manual Therapy  measurements               PT Short Term Goals - 09/14/18 1257      PT SHORT TERM GOAL #1   Title  PT UE volumes to decrease 1.0 cm to decrease pt complaint of numbing in her Rt UE>    Time  2    Period  Weeks    Status  On-going    Target Date  09/21/18   rx deferred until next week due to insurance     PT [place] #2   Title  Pt to be I in UE exercises and self manual techniques to allow pt to keep volumes down on her own.    Time  2    Period  Weeks    Status  Partially Met   doing exercises has not been instructed in manual yet              Plan - 09/26/18 1641    Clinical Impression Statement  UE remeasured this session with slight reduction in hand and upper UE.  Lower UE with slight increase at 10 and 15cm proximal to ulnar styloid.  continued with manual lymph drainage this session.  Pt instructed to continue doing self massage and wearing her UE garment.  Informed [facility] would be calling them in next coupld days to get needed equipment.    Examination-Activity Limitations  Other    Examination-Participation Restrictions  Other    Stability/Clinical Decision Making  Evolving/Moderate complexity    Rehab Potential  Good  PT Frequency  3x / week    PT Duration  2 weeks    PT Treatment/Interventions  ADLs/Self Care Home Management;Manual lymph drainage;Compression bandaging;Other (comment)    PT Next Visit Plan  continue with manual lymph drainage, measure weekly.  Follow up on contact with rep next session.    PT Home Exercise Plan  UE exercises to increase lymphatic circulation        Patient will benefit from skilled therapeutic intervention in order to improve the following deficits and impairments:  Increased edema  Visit Diagnosis: Lymphedema, not elsewhere classified  Postmastectomy lymphedema     Problem List Patient Active Problem List   Diagnosis Date Noted  . Uncontrolled type 1 diabetes mellitus with hyperglycemia ([place]) 06/04/2018  . Uncontrolled type 2 diabetes mellitus with hyperglycemia ([place]) 03/21/2018  . Neutropenic fever ([place]) 09/11/2015  . Chemotherapy induced neutropenia ([place]) 09/10/2015  . Nausea without vomiting 07/09/2015  . Genetic counseling and testing 06/29/2015  . Iron deficiency anemia due to chronic blood loss 06/12/2015  . Malignant neoplasm of upper-outer quadrant of right female breast ([place]) 06/11/2015  . Bilateral cataracts 05/12/2015  . Type 2 diabetes mellitus without complication ([place]) 86/75/4492  . Gastroparesis 08/26/2013  . THYROID NODULE, RIGHT 04/17/2008  . Ulcerative colitis ([place]) 11/01/2007  . Type 1 diabetes mellitus ([place]) 10/11/2007  . Mixed hyperlipidemia 10/11/2007  . ANEMIA-NOS 10/11/2007  . Depression 10/11/2007  . GERD 10/11/2007  . PEPTIC ULCER DISEASE 10/11/2007  . HEADACHE 10/11/2007  . COLONIC POLYPS, HX OF 10/11/2007  . UTI'S, HX OF 10/11/2007  . CHICKENPOX, HX OF 10/11/2007   Teena Irani, PTA/CLT [phone]  Teena Irani 09/26/2018, 4:44 PM  [facility] [address] [place], [place], [address] Phone: [phone]   Fax:  [phone]  Name: Brittney [name] MRN: [id number] Date of Birth: November 09, 1970

## 2018-09-28 ENCOUNTER — Telehealth (HOSPITAL_COMMUNITY): Payer: Self-pay | Admitting: Family Medicine

## 2018-09-28 ENCOUNTER — Ambulatory Visit (HOSPITAL_COMMUNITY): Payer: Medicaid Other | Admitting: Physical Therapy

## 2018-09-28 NOTE — Telephone Encounter (Signed)
09/28/18  pt left a message to cx today she said that something unexpected came up but will be here at her next appt.

## 2018-10-01 ENCOUNTER — Ambulatory Visit (HOSPITAL_COMMUNITY): Payer: Medicaid Other | Admitting: Physical Therapy

## 2018-10-01 ENCOUNTER — Telehealth (HOSPITAL_COMMUNITY): Payer: Self-pay | Admitting: Physical Therapy

## 2018-10-01 NOTE — Telephone Encounter (Signed)
Called Pt re missed appointment. Left message that her next appointment is on the 28th.   Rayetta Humphrey, [place] CLT [phone]

## 2018-10-03 ENCOUNTER — Ambulatory Visit (HOSPITAL_COMMUNITY): Payer: Medicaid Other | Admitting: Physical Therapy

## 2018-10-03 ENCOUNTER — Telehealth (HOSPITAL_COMMUNITY): Payer: Self-pay | Admitting: Physical Therapy

## 2018-10-03 NOTE — Telephone Encounter (Signed)
Called pt re this has been her 2nd no show.  PT states that she has not been herself but is not in any danger of hurting herself.  Explained that her next session is on 10/5 and that the therapist has cancelled all other appointments due to this being her second no show as well as her insurance needing reauthorization.   Rayetta Humphrey, [place] CLT [phone]

## 2018-10-07 NOTE — Progress Notes (Signed)
Subjective:    Patient ID: Brittney [name], female    DOB: [birth year], 48 y.o.   MRN: [id number]  HPI Brittney [name] is a 48 year old female with a past medical history of breast cancer s/p right beast lumpectomy 05/13/2015 with chemo and radiation, DM I diagnosed age 6, IDA, GERD and Ulcerative Colitis diagnosed in 1997.  She is on Apriso 0.373m four tabs po QD. She had a mild flare of colitis in June 2020 with red blood in her bowel movements which resolved after she took CPlainsfor 2 weeks. She complains of having occasional of central lower abdominal pain which occurs once or twice weekly. She complains of abdominal bloat. Decreased appetite when bloated. She feels full more easily.  She is taking omeprazole 20 mg p.o. twice daily for GERD.  If she skips 3 to 4 days of omeprazole she develops upper mid esophageal pain.  No dysphasia.  She is passing 1 or 2 formed brown bowel movements most days, she occasionally skips 1 day.  She has infrequent red blood per the rectum, infrequent bloody mucus per the rectum.  No fever.  She has occasional sweats and chills which she attributes to being perimenopausal.  She denies having any weight loss.  She has a history of iron deficiency anemia which is been attributed to chronic blood loss from her ulcerative colitis.  Her last Feraheme infusion was 11/27/2017.  Labs 07/16/2018 showed a hemoglobin 11.5.  Hematocrit 37.  MCV 89.6.  Platelet 159.  Vitamin B12 219.  Iron 56.  TIBC 319.  Ferritin 146.  She was started on vitamin B12 1000 mcg 1 tablet daily.  She denies NSAID use.  Her most recent colonoscopy was 10/19/2013, see results below.  Her maternal uncle possibly had colon cancer.  Her daughter was diagnosed with Crohn's disease at the age of 971or 1105  She is currently 48years old and she receives Remicade infusions every 8 weeks.  Colonoscopy 10/19/2013: 1. The examined terminal ileum appeared to be normal 2. Small abnormal mucosa was found in the  sigmoid colon; The mucosa was erythematous and had granularity; multiple biopsies were performed 3. Diffuse abnormal mucosa was found in the rectum; The mucosa was edematous, erythematous and had erosions; multiple biopsies of the area were performed 4. Small hypertrophic anal papilla in the anal canal 1. Colon, biopsy, sigmoid - CHRONIC MILDLY ACTIVE COLITIS. NEGATIVE FOR DYSPLASIA. 2. Rectum, biopsy - CHRONIC MILDLY ACTIVE COLITIS. NEGATIVE FOR DYSPLASIA. Microscopic Comment 1. and 2. There are multiple colonic mucosal fragments with patchy increased infiltrate in the lamina propria, altered crypt architecture and occasional foci of mild active neutrophilic inflammation. The findings are consistent with chronic mildly active inflammatory bowel disease. No granulomas or dysplasia is identified.  Flexible sigmoidoscopy 07/25/2012:  Normal mucosa of transverse, descending and proximal sigmoid colon. Mucosa of distal sigmoid colon with erythema friability and erosions. Transition at 30 cm from the anal margin. Diffuse involvement of the rectal mucosa with multiple erosions friability and loss of vascularity. No polyps or tumor identified in the segments that were examined. Active distal ulcerative colitis with transition at 30 cm from anal margin.  1. Colon, biopsy, sigmoid - MODERATELY ACTIVE CHRONIC COLITIS. - NEGATIVE FOR DYSPLASIA. 2. Rectum, biopsy - MODERATELY ACTIVE CHRONIC PROCTITIS.  - NEGATIVE FOR DYSPLASIA.  Past Medical History:  Diagnosis Date  . Breast cancer ([place]   . Breast cancer of upper-outer quadrant of right female breast ([place] 06/11/2015   Triple  negative, 2 cm   . Chemotherapy induced neutropenia ([place]) 09/10/2015  . Diabetes mellitus ([place])    Type 1 over 15 yrs  . Environmental allergies   . GERD (gastroesophageal reflux disease)   . Iron deficiency anemia due to chronic blood loss 06/12/2015  . Personal history of chemotherapy   . Personal history of radiation  therapy   . 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 BREAST LUMPECTOMY Right 2017  . CESAREAN SECTION    . COLONOSCOPY N/A 10/09/2013   Procedure: COLONOSCOPY;  Surgeon: Rogene [place], MD;  Location: [facility] END[facility] SU[facility];  Service: Endoscopy;  Laterality: N/A;  100  . DILATION AND CURETTAGE OF UTERUS     x 2 for miscarriages  . ESOPHAGOGASTRODUODENOSCOPY    . FLEXIBLE SIGMOIDOSCOPY    . FLEXIBLE SIGMOIDOSCOPY  07/27/2011   Procedure: FLEXIBLE SIGMOIDOSCOPY;  Surgeon: Rogene [place], MD;  Location: [facility] ENDO SUITE;  Service: Endoscopy;  Laterality: N/A;  100   Current Outpatient Medications on File Prior to Visit  Medication Sig Dispense Refill  . citalopram (CELEXA) 20 MG tablet Take 20 mg by mouth daily.    [name] losartan (COZAAR) 25 MG tablet Take 25 mg by mouth daily.    [name] acetaminophen (TYLENOL) 500 MG tablet Take 1,000 mg by mouth every 6 (six) hours as needed. For pain    . acyclovir (ZOVIRAX) 200 MG capsule Take 200 mg by mouth 2 (two) times daily. Reported on 07/23/2015    . albuterol (PROVENTIL HFA;VENTOLIN HFA) 108 (90 Base) MCG/ACT inhaler Inhale 2 puffs into the lungs every 4 (four) hours.     . APRISO 0.375 g 24 hr capsule 4 capsules daily.    [name] atorvastatin (LIPITOR) 20 MG tablet daily.    [name] gabapentin (NEURONTIN) 300 MG capsule Take 600 mg by mouth at bedtime. 300 mg in am and 600 mg at bedtime.  6  . HUMALOG 100 UNIT/ML injection Inject 8-11 Units into the skin 3 (three) times daily with meals.  6  . LANTUS SOLOSTAR 100 UNIT/ML Solostar Pen Inject 22 Units into the skin at bedtime.   6  . loratadine (CLARITIN) 10 MG tablet TAKE ONE TABLET BY MOUTH DAILY FOR HIVES.  6  . montelukast (SINGULAIR) 10 MG tablet Take 10 mg by mouth every evening.    . Olopatadine HCl (PATADAY) 0.2 % SOLN Apply 1 drop to eye daily. Both eyes    . omeprazole (PRILOSEC) 20 MG capsule Take 40 mg by mouth 2 (two) times daily before a meal. Pt  takes 20 mg before breakfast and 20 mg before dinner     Current Facility-Administered Medications on File Prior to Visit  Medication Dose Route Frequency Provider Last Rate Last Dose  . 0.9 %  sodium chloride infusion   Intravenous Continuous Kefalas, Manon Hilding, PA-C      . 0.9 %  sodium chloride infusion   Intravenous Continuous Higgs, Mathis Dad, MD   Stopped at 05/26/17 1510   Allergies  Allergen Reactions  . Ace Inhibitors Itching  . Peanut Oil Itching  . Shellfish Allergy Itching and Rash  . Statins Itching  . Adhesive [Tape] Rash  . Pravastatin Sodium Itching   Review of Systems see HPI, all other systems reviewed and are negative     Objective:   Physical Exam  BP (!) 159/76   Pulse 72   Temp 98.2 F (36.8 C)   Ht  5' 1"  (1.549 m)   Wt 185 lb 14.4 oz (84.3 kg)   LMP 09/04/2015 (Approximate)   BMI 35.13 kg/m  General: 48 year old female in no acute distress Eyes: Sclera nonicteric, conjunctiva pink Mouth: Dentition intact, no ulcers or lesions Neck: Thyromegaly present, the right thyroid lobe is greater than the left, no lymphadenopathy Heart: Regular rate and rhythm, no murmurs Lungs: Breath sounds clear throughout Abdomen: Soft, mild epigastric tenderness without rebound or guarding, positive bowel sounds all 4 quadrants, no HSM Extremities: Trace edema to the ankles Neuro: Alert and oriented x4, no focal deficits   Assessment/Plan:  32. 48 year old female with Ulcerative Colitis, sigmoid colon and rectum  -Colonoscopy benefits and risk discussed including risk with sedation, risk of bleeding, perforation infection -Continue Apriso 0.375 mg 4 tabs p.o. daily  2. Iron deficiency anemia, most likely from chronic blood loss from UC -EGD at time of colonoscopy  -Celiac panel be done at the same time of her vitamin B12 level in 1 to 2 months  3. Vitamin B12 deficiency -Recheck vitamin B12 levels in 1 to 2 months  4. GERD, early satiety -EGD at time of colonoscopy  -Continue omeprazole 20 mg p.o. twice daily for now  5. History of breast cancer followed by Dr. Lamonte Richer  6. History of rheumatoid arthritis   Further follow-up to be determined after EGD and colonoscopy completed  7. DM I on insulin

## 2018-10-08 ENCOUNTER — Other Ambulatory Visit: Payer: Self-pay

## 2018-10-08 ENCOUNTER — Ambulatory Visit (HOSPITAL_COMMUNITY): Payer: Medicaid Other | Attending: Family Medicine | Admitting: Physical Therapy

## 2018-10-08 DIAGNOSIS — I890 Lymphedema, not elsewhere classified: Secondary | ICD-10-CM | POA: Insufficient documentation

## 2018-10-08 DIAGNOSIS — I972 Postmastectomy lymphedema syndrome: Secondary | ICD-10-CM | POA: Insufficient documentation

## 2018-10-08 NOTE — Therapy (Signed)
[facility] [address] [place], [place], [address] Phone: [phone]   Fax:  [phone]  Physical Therapy Treatment  Patient Details  Name: Brittney [name] MRN: [id number] Date of Birth: 08-31-1970 Referring Provider (PT): Virgia Land   PHYSICAL THERAPY DISCHARGE SUMMARY  Visits from Start of Care: 8  Current functional level related to goals / functional outcomes: See below    Remaining deficits: See below   Education / Equipment: Garment,reidsleeve and pump will be allocated to pt via [place]: Patient agrees to discharge.  Patient goals were not met. Patient is being discharged due to meeting the stated rehab goals.  ?????      Encounter Date: 10/08/2018  PT End of Session - 10/08/18 1625    Visit Number  8    Number of Visits  8    Date for PT Re-Evaluation  09/14/18    Authorization Type  medicaid approved 3 visit 9/7-9/13, approved 9 visits 9/15-10/5    Authorization - Visit Number  8    Authorization - Number of Visits  8    PT Start Time  7793   pt late   PT Stop Time  1618    PT Time Calculation (min)  40 min    Activity Tolerance  Patient tolerated treatment well    Behavior During Therapy  [facility] for tasks assessed/performed       Past Medical History:  Diagnosis Date  . Breast cancer ([place])   . Breast cancer of upper-outer quadrant of right female breast ([place]) 06/11/2015   Triple negative, 2 cm   . Chemotherapy induced neutropenia ([place]) 09/10/2015  . Diabetes mellitus ([place])    Type 1 over 15 yrs  . Environmental allergies   . GERD (gastroesophageal reflux disease)   . Iron deficiency anemia due to chronic blood loss 06/12/2015  . Personal history of chemotherapy   . Personal history of radiation therapy   . 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 BREAST LUMPECTOMY Right 2017  . CESAREAN SECTION    . COLONOSCOPY  N/A 10/09/2013   Procedure: COLONOSCOPY;  Surgeon: Rogene [place], MD;  Location: [facility];  Service: Endoscopy;  Laterality: N/A;  100  . DILATION AND CURETTAGE OF UTERUS     x 2 for miscarriages  . ESOPHAGOGASTRODUODENOSCOPY    . FLEXIBLE SIGMOIDOSCOPY    . FLEXIBLE SIGMOIDOSCOPY  07/27/2011   Procedure: FLEXIBLE SIGMOIDOSCOPY;  Surgeon: Rogene [place], MD;  Location: [facility];  Service: Endoscopy;  Laterality: N/A;  100    There were no vitals filed for this visit.  Subjective Assessment - 10/08/18 1543    Subjective  PT has not been to our clinic since 09/26/2018, states she has depressed but does not feel she would harm self or want information at this time.  Pt still has not heard from [facility].  Her arm feels like she has had more tingling in the past few days.  She has not been doing self massaging.    Currently in Pain?  Yes    Pain Score  5    more tingling than pain   Pain Location  Arm    Pain Orientation  Left    Pain Descriptors / Indicators  Tingling    Pain Type  Acute pain            LYMPHEDEMA/ONCOLOGY QUESTIONNAIRE -  10/08/18 1621      Left Upper Extremity Lymphedema   15 cm Proximal to Olecranon Process  --   was 34.5 on 8/31                         PT Short Term Goals - 10/08/18 1557      PT SHORT TERM GOAL #1   Title  PT UE volumes to decrease 1.0 cm to decrease pt complaint of numbing in her Rt UE>    Time  2    Period  Weeks    Status  Partially Met   volume is down but pt is still having tingling   Target Date  09/21/18   rx deferred until next week due to insurance     PT [place] #2   Title  Pt to be I in UE exercises and self manual techniques to allow pt to keep volumes down on her own.    Time  2    Period  Weeks    Status  Achieved   PT is I in exercises and self massage technique.              Plan - 10/08/18 1626    Clinical Impression Statement  Called [facility] while pt present.  They  will be measuring pt this week for garment and pump.  Pt remeasured and volumes are equal to LT at this time.  PT to be discharged from therapy at this time and will be followed up with [facility].  PT is I in exercises and manual and is not longer in need of skilled care.    Examination-Activity Limitations  Other    Examination-Participation Restrictions  Other    Stability/Clinical Decision Making  Evolving/Moderate complexity    Rehab Potential  Good    PT Frequency  3x / week    PT Duration  2 weeks    PT Treatment/Interventions  ADLs/Self Care Home Management;Manual lymph drainage;Compression bandaging;Other (comment)    PT Next Visit Plan  Discharge.    PT Home Exercise Plan  UE exercises to increase lymphatic circulation       Patient will benefit from skilled therapeutic intervention in order to improve the following deficits and impairments:  Increased edema  Visit Diagnosis: Lymphedema, not elsewhere classified  Postmastectomy lymphedema     Problem List Patient Active Problem List   Diagnosis Date Noted  . Uncontrolled type 1 diabetes mellitus with hyperglycemia ([place]) 06/04/2018  . Uncontrolled type 2 diabetes mellitus with hyperglycemia ([place]) 03/21/2018  . Neutropenic fever ([place]) 09/11/2015  . Chemotherapy induced neutropenia ([place]) 09/10/2015  . Nausea without vomiting 07/09/2015  . Genetic counseling and testing 06/29/2015  . Iron deficiency anemia due to chronic blood loss 06/12/2015  . Malignant neoplasm of upper-outer quadrant of right female breast ([place]) 06/11/2015  . Bilateral cataracts 05/12/2015  . Type 2 diabetes mellitus without complication ([place]) 16/10/9602  . Gastroparesis 08/26/2013  . THYROID NODULE, RIGHT 04/17/2008  . Ulcerative colitis ([place]) 11/01/2007  . Type 1 diabetes mellitus ([place]) 10/11/2007  . Mixed hyperlipidemia 10/11/2007  . ANEMIA-NOS 10/11/2007  . Depression 10/11/2007  . GERD 10/11/2007  . PEPTIC ULCER DISEASE 10/11/2007  . HEADACHE  10/11/2007  . COLONIC POLYPS, HX OF 10/11/2007  . UTI'S, HX OF 10/11/2007  . Demetrius Charity OF 10/11/2007    Rayetta Humphrey, PT CLT [phone] 10/08/2018, 4:28 PM  [facility]  [place], [place], [address] Phone: [phone]   Fax:  [phone]  Name: Brittney [name] MRN: [id number] Date of Birth: 19-Feb-1970

## 2018-10-09 ENCOUNTER — Encounter (INDEPENDENT_AMBULATORY_CARE_PROVIDER_SITE_OTHER): Payer: Self-pay | Admitting: Nurse Practitioner

## 2018-10-09 ENCOUNTER — Other Ambulatory Visit: Payer: Self-pay

## 2018-10-09 ENCOUNTER — Ambulatory Visit (INDEPENDENT_AMBULATORY_CARE_PROVIDER_SITE_OTHER): Payer: Medicaid Other | Admitting: Nurse Practitioner

## 2018-10-09 VITALS — BP 159/76 | HR 72 | Temp 98.2°F | Ht 61.0 in | Wt 185.9 lb

## 2018-10-09 DIAGNOSIS — E538 Deficiency of other specified B group vitamins: Secondary | ICD-10-CM

## 2018-10-09 DIAGNOSIS — K5120 Ulcerative (chronic) proctitis without complications: Secondary | ICD-10-CM | POA: Diagnosis not present

## 2018-10-09 DIAGNOSIS — K219 Gastro-esophageal reflux disease without esophagitis: Secondary | ICD-10-CM

## 2018-10-09 DIAGNOSIS — D508 Other iron deficiency anemias: Secondary | ICD-10-CM

## 2018-10-09 DIAGNOSIS — K5190 Ulcerative colitis, unspecified, without complications: Secondary | ICD-10-CM | POA: Diagnosis not present

## 2018-10-09 NOTE — Patient Instructions (Signed)
  1. Schedule an EGD and colonoscopy   2. Complete the provided lab order in the next 1 to 2 months  3. Further follow up to be determined after EGD and colonoscopy completed

## 2018-10-10 ENCOUNTER — Encounter (HOSPITAL_COMMUNITY): Payer: Medicaid Other | Admitting: Physical Therapy

## 2018-10-12 ENCOUNTER — Encounter (HOSPITAL_COMMUNITY): Payer: Medicaid Other | Admitting: Physical Therapy

## 2018-10-12 ENCOUNTER — Other Ambulatory Visit (INDEPENDENT_AMBULATORY_CARE_PROVIDER_SITE_OTHER): Payer: Self-pay | Admitting: Internal Medicine

## 2018-10-15 ENCOUNTER — Encounter (HOSPITAL_COMMUNITY): Payer: Medicaid Other | Admitting: Physical Therapy

## 2018-10-15 ENCOUNTER — Other Ambulatory Visit (INDEPENDENT_AMBULATORY_CARE_PROVIDER_SITE_OTHER): Payer: Self-pay | Admitting: *Deleted

## 2018-10-15 DIAGNOSIS — K219 Gastro-esophageal reflux disease without esophagitis: Secondary | ICD-10-CM | POA: Insufficient documentation

## 2018-10-15 DIAGNOSIS — K5190 Ulcerative colitis, unspecified, without complications: Secondary | ICD-10-CM | POA: Insufficient documentation

## 2018-10-17 ENCOUNTER — Encounter (HOSPITAL_COMMUNITY): Payer: Medicaid Other | Admitting: Physical Therapy

## 2018-10-19 ENCOUNTER — Encounter (HOSPITAL_COMMUNITY): Payer: Medicaid Other | Admitting: Physical Therapy

## 2018-10-22 ENCOUNTER — Encounter (HOSPITAL_COMMUNITY): Payer: Medicaid Other | Admitting: Physical Therapy

## 2018-10-24 ENCOUNTER — Encounter (HOSPITAL_COMMUNITY): Payer: Medicaid Other | Admitting: Physical Therapy

## 2018-10-26 ENCOUNTER — Encounter (HOSPITAL_COMMUNITY): Payer: Medicaid Other | Admitting: Physical Therapy

## 2018-10-29 ENCOUNTER — Encounter (HOSPITAL_COMMUNITY): Payer: Medicaid Other | Admitting: Physical Therapy

## 2018-10-31 ENCOUNTER — Encounter (HOSPITAL_COMMUNITY): Payer: Medicaid Other | Admitting: Physical Therapy

## 2018-11-02 ENCOUNTER — Encounter (HOSPITAL_COMMUNITY): Payer: Medicaid Other | Admitting: Physical Therapy

## 2018-11-06 ENCOUNTER — Telehealth (INDEPENDENT_AMBULATORY_CARE_PROVIDER_SITE_OTHER): Payer: Self-pay | Admitting: *Deleted

## 2018-11-06 ENCOUNTER — Other Ambulatory Visit (INDEPENDENT_AMBULATORY_CARE_PROVIDER_SITE_OTHER): Payer: Self-pay | Admitting: *Deleted

## 2018-11-06 ENCOUNTER — Encounter (INDEPENDENT_AMBULATORY_CARE_PROVIDER_SITE_OTHER): Payer: Self-pay | Admitting: *Deleted

## 2018-11-06 DIAGNOSIS — K5190 Ulcerative colitis, unspecified, without complications: Secondary | ICD-10-CM

## 2018-11-06 MED ORDER — SUPREP BOWEL PREP KIT 17.5-3.13-1.6 GM/177ML PO SOLN: 1.0000 | Freq: Once | ORAL | 0 refills | Status: AC

## 2018-11-06 NOTE — Telephone Encounter (Signed)
Patient needs suprep TCS/EGD sch'd 12/2

## 2018-11-12 ENCOUNTER — Inpatient Hospital Stay (HOSPITAL_COMMUNITY): Payer: Medicaid Other | Attending: Hematology

## 2018-11-12 ENCOUNTER — Other Ambulatory Visit: Payer: Self-pay

## 2018-11-12 DIAGNOSIS — E119 Type 2 diabetes mellitus without complications: Secondary | ICD-10-CM | POA: Insufficient documentation

## 2018-11-12 DIAGNOSIS — F329 Major depressive disorder, single episode, unspecified: Secondary | ICD-10-CM | POA: Insufficient documentation

## 2018-11-12 DIAGNOSIS — Z794 Long term (current) use of insulin: Secondary | ICD-10-CM | POA: Diagnosis not present

## 2018-11-12 DIAGNOSIS — Z79899 Other long term (current) drug therapy: Secondary | ICD-10-CM | POA: Diagnosis not present

## 2018-11-12 DIAGNOSIS — Z8249 Family history of ischemic heart disease and other diseases of the circulatory system: Secondary | ICD-10-CM | POA: Insufficient documentation

## 2018-11-12 DIAGNOSIS — Z923 Personal history of irradiation: Secondary | ICD-10-CM | POA: Diagnosis not present

## 2018-11-12 DIAGNOSIS — Z833 Family history of diabetes mellitus: Secondary | ICD-10-CM | POA: Diagnosis not present

## 2018-11-12 DIAGNOSIS — D508 Other iron deficiency anemias: Secondary | ICD-10-CM | POA: Diagnosis not present

## 2018-11-12 DIAGNOSIS — Z803 Family history of malignant neoplasm of breast: Secondary | ICD-10-CM | POA: Insufficient documentation

## 2018-11-12 DIAGNOSIS — Z800 Family history of malignant neoplasm of digestive organs: Secondary | ICD-10-CM | POA: Insufficient documentation

## 2018-11-12 DIAGNOSIS — E538 Deficiency of other specified B group vitamins: Secondary | ICD-10-CM | POA: Insufficient documentation

## 2018-11-12 DIAGNOSIS — C50411 Malignant neoplasm of upper-outer quadrant of right female breast: Secondary | ICD-10-CM

## 2018-11-12 DIAGNOSIS — Z9221 Personal history of antineoplastic chemotherapy: Secondary | ICD-10-CM | POA: Diagnosis not present

## 2018-11-12 DIAGNOSIS — Z853 Personal history of malignant neoplasm of breast: Secondary | ICD-10-CM | POA: Insufficient documentation

## 2018-11-12 DIAGNOSIS — Z171 Estrogen receptor negative status [ER-]: Secondary | ICD-10-CM | POA: Insufficient documentation

## 2018-11-12 DIAGNOSIS — K51911 Ulcerative colitis, unspecified with rectal bleeding: Secondary | ICD-10-CM | POA: Diagnosis not present

## 2018-11-12 DIAGNOSIS — D500 Iron deficiency anemia secondary to blood loss (chronic): Secondary | ICD-10-CM

## 2018-11-12 LAB — CBC WITH DIFFERENTIAL/PLATELET
Abs Immature Granulocytes: 0.01 10*3/uL (ref 0.00–0.07)
Basophils Absolute: 0 10*3/uL (ref 0.0–0.1)
Basophils Relative: 0 %
Eosinophils Absolute: 0 10*3/uL (ref 0.0–0.5)
Eosinophils Relative: 0 %
HCT: 38.2 % (ref 36.0–46.0)
Hemoglobin: 11.7 g/dL — ABNORMAL LOW (ref 12.0–15.0)
Immature Granulocytes: 0 %
Lymphocytes Relative: 45 %
Lymphs Abs: 1.4 10*3/uL (ref 0.7–4.0)
MCH: 27.2 pg (ref 26.0–34.0)
MCHC: 30.6 g/dL (ref 30.0–36.0)
MCV: 88.8 fL (ref 80.0–100.0)
Monocytes Absolute: 0.3 10*3/uL (ref 0.1–1.0)
Monocytes Relative: 9 %
Neutro Abs: 1.4 10*3/uL — ABNORMAL LOW (ref 1.7–7.7)
Neutrophils Relative %: 46 %
Platelets: 162 10*3/uL (ref 150–400)
RBC: 4.3 MIL/uL (ref 3.87–5.11)
RDW: 13.9 % (ref 11.5–15.5)
WBC: 3.1 10*3/uL — ABNORMAL LOW (ref 4.0–10.5)
nRBC: 0 % (ref 0.0–0.2)

## 2018-11-12 LAB — COMPREHENSIVE METABOLIC PANEL
ALT: 35 U/L (ref 0–44)
AST: 34 U/L (ref 15–41)
Albumin: 3.9 g/dL (ref 3.5–5.0)
Alkaline Phosphatase: 89 U/L (ref 38–126)
Anion gap: 7 (ref 5–15)
BUN: 8 mg/dL (ref 6–20)
CO2: 29 mmol/L (ref 22–32)
Calcium: 9.1 mg/dL (ref 8.9–10.3)
Chloride: 106 mmol/L (ref 98–111)
Creatinine, Ser: 0.5 mg/dL (ref 0.44–1.00)
GFR calc Af Amer: >60 mL/min (ref >60)
GFR calc non Af Amer: >60 mL/min (ref >60)
Glucose, Bld: 127 mg/dL — ABNORMAL HIGH (ref 70–99)
Potassium: 4.1 mmol/L (ref 3.5–5.1)
Sodium: 142 mmol/L (ref 135–145)
Total Bilirubin: 1 mg/dL (ref 0.3–1.2)
Total Protein: 6.7 g/dL (ref 6.5–8.1)

## 2018-11-12 LAB — FOLATE: Folate: 10.7 ng/mL (ref >5.9)

## 2018-11-12 LAB — FERRITIN: Ferritin: 107 ng/mL (ref 11–307)

## 2018-11-12 LAB — VITAMIN B12: Vitamin B-12: 1802 pg/mL — ABNORMAL HIGH (ref 180–914)

## 2018-11-12 LAB — IRON AND TIBC
Iron: 68 ug/dL (ref 28–170)
Saturation Ratios: 20 % (ref 10.4–31.8)
TIBC: 340 ug/dL (ref 250–450)
UIBC: 272 ug/dL

## 2018-11-19 ENCOUNTER — Other Ambulatory Visit: Payer: Self-pay

## 2018-11-19 ENCOUNTER — Ambulatory Visit (HOSPITAL_COMMUNITY): Payer: Medicaid Other | Admitting: Nurse Practitioner

## 2018-11-20 ENCOUNTER — Other Ambulatory Visit (HOSPITAL_COMMUNITY): Payer: Self-pay | Admitting: Nurse Practitioner

## 2018-11-20 ENCOUNTER — Inpatient Hospital Stay (HOSPITAL_BASED_OUTPATIENT_CLINIC_OR_DEPARTMENT_OTHER): Payer: Medicaid Other | Admitting: Nurse Practitioner

## 2018-11-20 DIAGNOSIS — C50411 Malignant neoplasm of upper-outer quadrant of right female breast: Secondary | ICD-10-CM

## 2018-11-20 DIAGNOSIS — Z853 Personal history of malignant neoplasm of breast: Secondary | ICD-10-CM | POA: Diagnosis not present

## 2018-11-20 NOTE — Patient Instructions (Signed)
[facility] at [facility] Discharge Instructions     Thank you for choosing [facility] at [facility] to provide your oncology and hematology care.  To afford each patient quality time with our provider, please arrive at least 15 minutes before your scheduled appointment time.   If you have a lab appointment with the Cancer Center please come in thru the Main Entrance and check in at the main information desk.  You need to re-schedule your appointment should you arrive 10 or more minutes late.  We strive to give you quality time with our providers, and arriving late affects you and other patients whose appointments are after yours.  Also, if you no show three or more times for appointments you may be dismissed from the clinic at the providers discretion.     Again, thank you for choosing [facility].  Our hope is that these requests will decrease the amount of time that you wait before being seen by our physicians.       _____________________________________________________________  Should you have questions after your visit to [facility], please contact our office at [phone] between the hours of 8:00 a.m. and 4:30 p.m.  Voicemails left after 4:00 p.m. will not be returned until the following business day.  For prescription refill requests, have your pharmacy contact our office and allow 72 hours.    Due to Covid, you will need to wear a mask upon entering the hospital. If you do not have a mask, a mask will be given to you at the Main Entrance upon arrival. For doctor visits, patients may have 1 support person with them. For treatment visits, patients can not have anyone with them due to social distancing guidelines and our immunocompromised population.      

## 2018-11-20 NOTE — Progress Notes (Signed)
Brittney [name], [place] [address]   CLINIC:  Medical Oncology/Hematology  PCP:  [name], MD [place] [id number] [phone]   REASON FOR VISIT: Follow-up for triple negative breast cancer  CURRENT THERAPY: Observation  BRIEF ONCOLOGIC HISTORY:  Oncology History  Malignant neoplasm of upper-outer quadrant of right female breast ([place])  04/29/2015 Initial Biopsy   upper outer quadrant mass R breast 2.9 cm by ultrasound, high grade invasive ductal carcinoma ER< 1%, PR 1%, HER -2 not amplified, Ki-67 66%   05/13/2015 Cancer Staging   pT1cN0   05/13/2015 Surgery   lumpectomy and sentinel node biopsies X 3, 2 cm tumor, 3 negative nodes    05/21/2015 Imaging   CT [facility] with postsurgical changes related to R breast lympectomy and R axillary LN dissection, no findings suspicious for metastatic disease   06/12/2015 Procedure   Port placement by Dr. Anthony Sar   06/17/2015 Imaging   MUGA- Normal left ventricular ejection fraction equal to 62.9%.   06/25/2015 - 08/13/2015 Chemotherapy   AC x 4   08/27/2015 - 09/03/2015 Chemotherapy   Weekly Taxol x 2 cycles    09/11/2015 - 09/15/2015 Hospital Admission   Neutropenic fever, ARF secondary to antibiotics   10/01/2015 - 11/12/2015 Chemotherapy   Taxotere 75 mg/m2 Q3 weeks x 3 cycles     10/01/2015 Treatment Plan Change   Taxol changed to Taxotere given hospitalization.     - 01/14/2016 Radiation Therapy   Radiation in [facility]   05/11/2016 Mammogram   IMPRESSION: No evidence of malignancy within either breast. Expected postsurgical changes within the right breast.       CANCER STAGING: Cancer Staging Malignant neoplasm of upper-outer quadrant of right female breast [facility]) Staging form: Breast, AJCC 7th Edition - Clinical stage from 05/13/2015: Stage IA (T1c, N0, M0) - Signed by Baird Cancer, PA-C on 06/25/2015    INTERVAL HISTORY:  Brittney [name] 48 y.o. female  returns for routine follow-up for breast cancer.  Patient reports she has been having problems with fatigue and depression for months now. She is being followed by her therapist closely.  She denies any bright red bleeding.  No melena. Denies any nausea, vomiting, or diarrhea. Denies any new pains. Had not noticed any recent bleeding such as epistaxis, hematuria or hematochezia. Denies recent chest pain on exertion, shortness of breath on minimal exertion, pre-syncopal episodes, or palpitations. Denies any numbness or tingling in hands or feet. Denies any recent fevers, infections, or recent hospitalizations. Patient reports appetite at 75% and energy level at 50%.  She is eating well maintain her weight this time.   REVIEW OF SYSTEMS:  Review of Systems  Constitutional: Positive for fatigue.  Cardiovascular: Positive for leg swelling.  Neurological: Positive for numbness.  Psychiatric/Behavioral: Positive for depression and sleep disturbance.  All other systems reviewed and are negative.    PAST MEDICAL/SURGICAL HISTORY:  Past Medical History:  Diagnosis Date  . Breast cancer (Brittney [name])   . Breast cancer of upper-outer quadrant of right female breast (Brittney [name]) 06/11/2015   Triple negative, 2 cm   . Chemotherapy induced neutropenia (Brittney [name]) 09/10/2015  . Diabetes mellitus (Brittney [name])    Type 1 over 15 yrs  . Environmental allergies   . GERD (gastroesophageal reflux disease)   . Iron deficiency anemia due to chronic blood loss 06/12/2015  . Personal history of chemotherapy   . Personal history of radiation therapy   . 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 BREAST LUMPECTOMY Right 2017  . CESAREAN SECTION    . COLONOSCOPY N/A 10/09/2013   Procedure: COLONOSCOPY;  Surgeon: Rogene Houston, MD;  Location: [facility] ENDO SUITE;  Service: Endoscopy;  Laterality: N/A;  100  . DILATION AND CURETTAGE OF UTERUS     x 2 for miscarriages  .  ESOPHAGOGASTRODUODENOSCOPY    . FLEXIBLE SIGMOIDOSCOPY    . FLEXIBLE SIGMOIDOSCOPY  07/27/2011   Procedure: FLEXIBLE SIGMOIDOSCOPY;  Surgeon: Rogene Houston, MD;  Location: [facility] ENDO SUITE;  Service: Endoscopy;  Laterality: N/A;  100     SOCIAL HISTORY:  Social History   Socioeconomic History  . Marital status: Single    Spouse name: Not on file  . Number of children: Not on file  . Years of education: Not on file  . Highest education level: Not on file  Occupational History  . Not on file  Social Needs  . Financial resource strain: Not on file  . Food insecurity    Worry: Not on file    Inability: Not on file  . Transportation needs    Medical: Not on file    Non-medical: Not on file  Tobacco Use  . Smoking status: Never Smoker  . Smokeless tobacco: Never Used  Substance and Sexual Activity  . Alcohol use: No    Alcohol/week: 0.0 standard drinks  . Drug use: No  . Sexual activity: Not on file  Lifestyle  . Physical activity    Days per week: Not on file    Minutes per session: Not on file  . Stress: Not on file  Relationships  . Social Herbalist on phone: Not on file    Gets together: Not on file    Attends religious service: Not on file    Active member of club or organization: Not on file    Attends meetings of clubs or organizations: Not on file    Relationship status: Not on file  . Intimate partner violence    Fear of current or ex partner: Not on file    Emotionally abused: Not on file    Physically abused: Not on file    Forced sexual activity: Not on file  Other Topics Concern  . Not on file  Social History Narrative  . Not on file    FAMILY HISTORY:  Family History  Problem Relation Age of Onset  . Non-Hodgkin's lymphoma Mother   . Hypertension Mother   . Diabetes Mellitus II Mother   . Hypertension Father   . Diabetes Mellitus II Father   . Colon cancer Cousin   . Breast cancer Maternal Aunt     CURRENT MEDICATIONS:   Outpatient Encounter Medications as of 11/20/2018  Medication Sig  . acyclovir (ZOVIRAX) 200 MG capsule Take 200 mg by mouth 2 (two) times daily. Reported on 07/23/2015  . albuterol (PROVENTIL HFA;VENTOLIN HFA) 108 (90 Base) MCG/ACT inhaler Inhale 2 puffs into the lungs every 4 (four) hours.   . APRISO 0.375 g 24 hr capsule TAKE FOUR (4) CAPSULES BY MOUTH DAILY.  [name] atorvastatin (LIPITOR) 20 MG tablet Take 20 mg by mouth daily at 6 PM.   . citalopram (CELEXA) 20 MG tablet Take 30 mg by mouth daily.   [name] gabapentin (NEURONTIN) 300 MG capsule Take 600 mg by mouth at bedtime. 300 mg in am and 600 mg at bedtime.  [name]  HUMALOG 100 UNIT/ML injection Inject 8-11 Units into the skin 3 (three) times daily with meals.  [name] LANTUS SOLOSTAR 100 UNIT/ML Solostar Pen Inject 24-26 Units into the skin at bedtime.   [name] loratadine (CLARITIN) 10 MG tablet TAKE ONE TABLET BY MOUTH DAILY FOR HIVES.  [name] montelukast (SINGULAIR) 10 MG tablet Take 10 mg by mouth every evening.  [name] omeprazole (PRILOSEC) 20 MG capsule Take 40 mg by mouth 2 (two) times daily before a meal. Pt takes 20 mg before breakfast and 20 mg before dinner  . traZODone (DESYREL) 50 MG tablet Take by mouth.  [name] acetaminophen (TYLENOL) 500 MG tablet Take 1,000 mg by mouth every 6 (six) hours as needed. For pain  . Olopatadine HCl (PATADAY) 0.2 % SOLN Apply 1 drop to eye as needed. Both eyes   . [DISCONTINUED] losartan (COZAAR) 25 MG tablet Take 25 mg by mouth daily.   Facility-Administered Encounter Medications as of 11/20/2018  Medication  . 0.9 %  sodium chloride infusion  . 0.9 %  sodium chloride infusion    ALLERGIES:  Allergies  Allergen Reactions  . Ace Inhibitors Itching  . Peanut Oil Itching  . Shellfish Allergy Itching and Rash  . Statins Itching  . Adhesive [Tape] Rash  . Pravastatin Sodium Itching     PHYSICAL EXAM:  ECOG Performance status: 1  Vitals:   11/20/18 1520  BP: (!) 152/91  Pulse: 72  Resp: 18  Temp: 97.8 F (36.6 C)   SpO2: 100%   Filed Weights   11/20/18 1520  Weight: 180 lb 3.2 oz (81.7 kg)    Physical Exam Constitutional:      Appearance: Normal appearance. She is normal weight.  Cardiovascular:     Rate and Rhythm: Normal rate and regular rhythm.     Heart sounds: Normal heart sounds.  Pulmonary:     Effort: Pulmonary effort is normal.     Breath sounds: Normal breath sounds.  Abdominal:     General: Bowel sounds are normal.     Palpations: Abdomen is soft.  Musculoskeletal: Normal range of motion.  Skin:    General: Skin is warm.  Neurological:     Mental Status: She is alert and oriented to person, place, and time. Mental status is at baseline.  Psychiatric:        Behavior: Behavior normal.        Thought Content: Thought content normal.        Judgment: Judgment normal.      LABORATORY DATA:  I have reviewed the labs as listed.  CBC    Component Value Date/Time   WBC 3.1 (L) 11/12/2018 1141   RBC 4.30 11/12/2018 1141   HGB 11.7 (L) 11/12/2018 1141   HCT 38.2 11/12/2018 1141   PLT 162 11/12/2018 1141   MCV 88.8 11/12/2018 1141   MCH 27.2 11/12/2018 1141   MCHC 30.6 11/12/2018 1141   RDW 13.9 11/12/2018 1141   LYMPHSABS 1.4 11/12/2018 1141   MONOABS 0.3 11/12/2018 1141   EOSABS 0.0 11/12/2018 1141   BASOSABS 0.0 11/12/2018 1141   CMP Latest Ref Rng & Units 11/12/2018 09/14/2018 07/16/2018  Glucose 70 - 99 mg/dL 127(H) 277(H) 317(H)  BUN 6 - 20 mg/dL _0 Creatinine 0.44 - 1.00 mg/dL 0.50 0.57 0.49  Sodium 135 - 145 mmol/L 142 140 139  Potassium 3.5 - 5.1 mmol/L 4.1 4.7 4.3  Chloride 98 - 111 mmol/L 106 104 101  CO2 22 - 32 mmol/L 29  30 28  Calcium 8.9 - 10.3 mg/dL 9.1 9.2 9.4  Total Protein 6.5 - 8.1 g/dL 6.7 6.3 6.6  Total Bilirubin 0.3 - 1.2 mg/dL 1.0 0.9 0.9  Alkaline Phos 38 - 126 U/L 89 - 84  AST 15 - 41 U/L 34 18 24  ALT 0 - 44 U/L 35 19 23     I personally performed a face-to-face visit.  All questions were answered to patient's stated satisfaction.  Encouraged patient to call with any new concerns or questions before his next visit to the cancer center and we can certain see him sooner, if needed.     ASSESSMENT & PLAN:   Malignant neoplasm of upper-outer quadrant of right female breast ([place]) 1.  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 Anthony Sar on 05/13/2015. -Status post adjuvant chemotherapy with dose dense AC followed by 3 cycles of dose dense docetaxel from 06/25/2015-11/12/2015. -Physical examination today is within normal limits lumpectomy scar in the right breast upper outer quadrant is stable.  No palpable masses. -Last mammogram was dated 05/29/2018 which was BI-RADS Category 2 benign. -Labs on 11/12/2018 showed WBC 3.1, hemoglobin 11.7, platelets 162. -She will follow-up in 6 months with repeat labs  2.  Iron deficiency state: -This is from chronic blood loss from ulcerative colitis. -Last Feraheme was on 11/27/2017. -She occasionally has episodes of bleeding per rectum she uses suppositories for 7 days which helped. -Labs on 11/12/2018 showed hemoglobin 11.7, ferritin 107, percent saturation 20 -She will follow-up in 6 months with repeat labs  3.  B12 deficiency: -Labs on 11/12/2018 showed vitamin B12 level 1802 -She is taking vitamin B12 1 mg daily. -She will follow-up with labs  4.  Ulcerative colitis: -She had an episode of rectal bleeding 1 week prior to June 28, 2018. -She uses canasa suppositories for 7 days and the bleeding subsided. -She is also taking mesalamine 4 tabs daily.  5.Depression: -She is seeing a therapist every 3 months. -She is taking Celexa her dose was just increased in August. -She will talk to him about switching medications if this does not work for her. -Her PCP also placed her on Abilify at nighttime.      Orders placed this encounter:  Orders Placed This Encounter  Procedures  . MM DIAG BREAST TOMO  BILATERAL  . Lactate dehydrogenase  . CBC with Differential/Platelet  . Comprehensive metabolic panel  . Ferritin  . Iron and TIBC  . Vitamin B12  . Vitamin D 25 hydroxy  . Folate      Francene Finders, FNP-C [place] [phone]

## 2018-11-20 NOTE — Assessment & Plan Note (Addendum)
1.  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 Anthony Sar on 05/13/2015. -Status post adjuvant chemotherapy with dose dense AC followed by 3 cycles of dose dense docetaxel from 06/25/2015-11/12/2015. -Physical examination today is within normal limits lumpectomy scar in the right breast upper outer quadrant is stable.  No palpable masses. -Last mammogram was dated 05/29/2018 which was BI-RADS Category 2 benign. -Labs on 11/12/2018 showed WBC 3.1, hemoglobin 11.7, platelets 162. -She will follow-up in 6 months with repeat labs  2.  Iron deficiency state: -This is from chronic blood loss from ulcerative colitis. -Last Feraheme was on 11/27/2017. -She occasionally has episodes of bleeding per rectum she uses suppositories for 7 days which helped. -Labs on 11/12/2018 showed hemoglobin 11.7, ferritin 107, percent saturation 20 -She will follow-up in 6 months with repeat labs  3.  B12 deficiency: -Labs on 11/12/2018 showed vitamin B12 level 1802 -She is taking vitamin B12 1 mg daily. -She will follow-up with labs  4.  Ulcerative colitis: -She had an episode of rectal bleeding 1 week prior to June 28, 2018. -She uses canasa suppositories for 7 days and the bleeding subsided. -She is also taking mesalamine 4 tabs daily.  5.Depression: -She is seeing a therapist every 3 months. -She is taking Celexa her dose was just increased in August. -She will talk to him about switching medications if this does not work for her. -Her PCP also placed her on Abilify at nighttime.

## 2018-12-03 ENCOUNTER — Other Ambulatory Visit: Payer: Self-pay

## 2018-12-03 ENCOUNTER — Other Ambulatory Visit (HOSPITAL_COMMUNITY)
Admission: RE | Admit: 2018-12-03 | Discharge: 2018-12-03 | Disposition: A | Discharge status: Home / Self Care | Payer: Medicaid Other | Source: Ambulatory Visit | Attending: Internal Medicine | Admitting: Internal Medicine

## 2018-12-03 DIAGNOSIS — Z01812 Encounter for preprocedural laboratory examination: Secondary | ICD-10-CM | POA: Insufficient documentation

## 2018-12-03 DIAGNOSIS — Z20828 Contact with and (suspected) exposure to other viral communicable diseases: Secondary | ICD-10-CM | POA: Insufficient documentation

## 2018-12-03 LAB — SARS CORONAVIRUS 2 (TAT 6-24 HRS): SARS Coronavirus 2: NEGATIVE

## 2018-12-05 ENCOUNTER — Encounter (HOSPITAL_COMMUNITY)
Admission: RE | Disposition: A | Discharge status: Home / Self Care | Payer: Self-pay | Source: Home / Self Care | Attending: Internal Medicine

## 2018-12-05 ENCOUNTER — Encounter (HOSPITAL_COMMUNITY): Payer: Self-pay | Admitting: *Deleted

## 2018-12-05 ENCOUNTER — Ambulatory Visit (HOSPITAL_COMMUNITY)
Admission: RE | Admit: 2018-12-05 | Discharge: 2018-12-05 | Disposition: A | Discharge status: Home / Self Care | Payer: Medicaid Other | Attending: Internal Medicine | Admitting: Internal Medicine

## 2018-12-05 ENCOUNTER — Other Ambulatory Visit: Payer: Self-pay

## 2018-12-05 DIAGNOSIS — Z803 Family history of malignant neoplasm of breast: Secondary | ICD-10-CM | POA: Insufficient documentation

## 2018-12-05 DIAGNOSIS — K5190 Ulcerative colitis, unspecified, without complications: Secondary | ICD-10-CM | POA: Insufficient documentation

## 2018-12-05 DIAGNOSIS — Z9221 Personal history of antineoplastic chemotherapy: Secondary | ICD-10-CM | POA: Diagnosis not present

## 2018-12-05 DIAGNOSIS — E109 Type 1 diabetes mellitus without complications: Secondary | ICD-10-CM | POA: Diagnosis not present

## 2018-12-05 DIAGNOSIS — Z79899 Other long term (current) drug therapy: Secondary | ICD-10-CM | POA: Diagnosis not present

## 2018-12-05 DIAGNOSIS — K3189 Other diseases of stomach and duodenum: Secondary | ICD-10-CM

## 2018-12-05 DIAGNOSIS — Z794 Long term (current) use of insulin: Secondary | ICD-10-CM | POA: Insufficient documentation

## 2018-12-05 DIAGNOSIS — D509 Iron deficiency anemia, unspecified: Secondary | ICD-10-CM | POA: Insufficient documentation

## 2018-12-05 DIAGNOSIS — K766 Portal hypertension: Secondary | ICD-10-CM

## 2018-12-05 DIAGNOSIS — K6289 Other specified diseases of anus and rectum: Secondary | ICD-10-CM | POA: Diagnosis not present

## 2018-12-05 DIAGNOSIS — K317 Polyp of stomach and duodenum: Secondary | ICD-10-CM | POA: Insufficient documentation

## 2018-12-05 DIAGNOSIS — Z923 Personal history of irradiation: Secondary | ICD-10-CM | POA: Diagnosis not present

## 2018-12-05 DIAGNOSIS — K6389 Other specified diseases of intestine: Secondary | ICD-10-CM | POA: Insufficient documentation

## 2018-12-05 DIAGNOSIS — K219 Gastro-esophageal reflux disease without esophagitis: Secondary | ICD-10-CM | POA: Diagnosis not present

## 2018-12-05 DIAGNOSIS — K228 Other specified diseases of esophagus: Secondary | ICD-10-CM | POA: Diagnosis not present

## 2018-12-05 HISTORY — PX: ESOPHAGOGASTRODUODENOSCOPY: SHX5428

## 2018-12-05 HISTORY — PX: COLONOSCOPY: SHX5424

## 2018-12-05 HISTORY — PX: BIOPSY: SHX5522

## 2018-12-05 LAB — C-REACTIVE PROTEIN: CRP: <0.8 mg/dL (ref <1.0)

## 2018-12-05 LAB — GLUCOSE, CAPILLARY: Glucose-Capillary: 171 mg/dL — ABNORMAL HIGH (ref 70–99)

## 2018-12-05 LAB — VITAMIN B12: Vitamin B-12: 1402 pg/mL — ABNORMAL HIGH (ref 180–914)

## 2018-12-05 SURGERY — EGD (ESOPHAGOGASTRODUODENOSCOPY)
Anesthesia: Moderate Sedation

## 2018-12-05 MED ORDER — SODIUM CHLORIDE 0.9 % IV SOLN
INTRAVENOUS | Status: DC
  Administered 2018-12-05: 1000 mL via INTRAVENOUS

## 2018-12-05 MED ORDER — MEPERIDINE HCL 50 MG/ML IJ SOLN
INTRAMUSCULAR | Status: DC | PRN
  Administered 2018-12-05 (×4): 25 mg via INTRAVENOUS

## 2018-12-05 MED ORDER — LIDOCAINE VISCOUS HCL 2 % MT SOLN
OROMUCOSAL | Status: AC
  Filled 2018-12-05: qty 15

## 2018-12-05 MED ORDER — LIDOCAINE VISCOUS HCL 2 % MT SOLN
OROMUCOSAL | Status: DC | PRN
  Administered 2018-12-05: 4 mL via OROMUCOSAL

## 2018-12-05 MED ORDER — MIDAZOLAM HCL 5 MG/5ML IJ SOLN
INTRAMUSCULAR | Status: AC
  Filled 2018-12-05: qty 10

## 2018-12-05 MED ORDER — STERILE WATER FOR IRRIGATION IR SOLN
Status: DC | PRN
  Administered 2018-12-05: 1.5 mL

## 2018-12-05 MED ORDER — MEPERIDINE HCL 50 MG/ML IJ SOLN
INTRAMUSCULAR | Status: AC
  Filled 2018-12-05: qty 1

## 2018-12-05 MED ORDER — MESALAMINE 1000 MG RE SUPP: 1000.0000 mg | Freq: Every day | RECTAL | 3 refills | Status: DC

## 2018-12-05 MED ORDER — MIDAZOLAM HCL 5 MG/5ML IJ SOLN
INTRAMUSCULAR | Status: DC | PRN
  Administered 2018-12-05 (×5): 2 mg via INTRAVENOUS

## 2018-12-05 NOTE — Discharge Instructions (Signed)
No aspirin or NSAIDs for 24 hours. Resume usual medications as before. Mesalamine suppository 4 g per rectum daily at bedtime. Resume usual diet. No driving for 24 hours. Physician will call with results of blood test and biopsy.    Colonoscopy, Adult, Care After This sheet gives you information about how to care for yourself after your procedure. Your doctor may also give you more specific instructions. If you have problems or questions, call your doctor. What can I expect after the procedure? After the procedure, it is common to have:  A small amount of blood in your poop for 24 hours.  Some gas.  Mild cramping or bloating in your belly. Follow these instructions at home: General instructions  For the first 24 hours after the procedure: ? Do not drive or use machinery. ? Do not sign important documents. ? Do not drink alcohol. ? Do your daily activities more slowly than normal. ? Eat foods that are soft and easy to digest.  Take over-the-counter or prescription medicines only as told by your doctor. To help cramping and bloating:   Try walking around.  Put heat on your belly (abdomen) as told by your doctor. Use a heat source that your doctor recommends, such as a moist heat pack or a heating pad. ? Put a towel between your skin and the heat source. ? Leave the heat on for 20-30 minutes. ? Remove the heat if your skin turns bright red. This is especially important if you cannot feel pain, heat, or cold. You can get burned. Eating and drinking   Drink enough fluid to keep your pee (urine) clear or pale yellow.  Return to your normal diet as told by your doctor. Avoid heavy or fried foods that are hard to digest.  Avoid drinking alcohol for as long as told by your doctor. Contact a doctor if:  You have blood in your poop (stool) 2-3 days after the procedure. Get help right away if:  You have more than a small amount of blood in your poop.  You see large clumps of  tissue (blood clots) in your poop.  Your belly is swollen.  You feel sick to your stomach (nauseous).  You throw up (vomit).  You have a fever.  You have belly pain that gets worse, and medicine does not help your pain. Summary  After the procedure, it is common to have a small amount of blood in your poop. You may also have mild cramping and bloating in your belly.  For the first 24 hours after the procedure, do not drive or use machinery, do not sign important documents, and do not drink alcohol.  Get help right away if you have a lot of blood in your poop, feel sick to your stomach, have a fever, or have more belly pain. This information is not intended to replace advice given to you by your health care provider. Make sure you discuss any questions you have with your health care provider. Document Released: 01/22/2010 Document Revised: 10/20/2016 Document Reviewed: 09/14/2015 Elsevier Patient Education  2020 [employer].  Colitis  Colitis is inflammation of the colon. Colitis may last a short time (be acute), or it may last a long time (become chronic). What are the causes? This condition may be caused by:  Viruses.  Bacteria.  Reaction to medicine.  Certain autoimmune diseases such as Crohn's disease or ulcerative colitis.  Radiation treatment.  Decreased blood flow to the bowel (ischemia). What are the signs or  symptoms? Symptoms of this condition include:  Watery diarrhea.  Passing bloody or tarry stool.  Pain.  Fever.  Vomiting.  Tiredness (fatigue).  Weight loss.  Bloating.  Abdominal pain.  Having fewer bowel movements than usual.  A strong and sudden urge to have a bowel movement.  Feeling like the bowel is not empty after a bowel movement. How is this diagnosed? This condition is diagnosed with a stool test or a blood test. You may also have other tests, such as:  X-rays.  CT scan.  Colonoscopy.  Endoscopy.  Biopsy. How is  this treated? Treatment for this condition depends on the cause. The condition may be treated by:  Resting the bowel. This involves not eating or drinking for a period of time.  Fluids that are given through an IV.  Medicine for pain and diarrhea.  Antibiotic medicines.  Cortisone medicines.  Surgery. Follow these instructions at home: Eating and drinking   Follow instructions from your health care provider about eating or drinking restrictions.  Drink enough fluid to keep your urine pale yellow.  Work with a dietitian to determine which foods cause your condition to flare up.  Avoid foods that cause flare-ups.  Eat a well-balanced diet. General instructions  If you were prescribed an antibiotic medicine, take it as told by your health care provider. Do not stop taking the antibiotic even if you start to feel better.  Take over-the-counter and prescription medicines only as told by your health care provider.  Keep all follow-up visits as told by your health care provider. This is important. Contact a health care provider if:  Your symptoms do not go away.  You develop new symptoms. Get help right away if you:  Have a fever that does not go away with treatment.  Develop chills.  Have extreme weakness, fainting, or dehydration.  Have repeated vomiting.  Develop severe pain in your abdomen.  Pass bloody or tarry stool. Summary  Colitis is inflammation of the colon. Colitis may last a short time (be acute), or it may last a long time (become chronic).  Treatment for this condition depends on the cause and may include resting the bowel, taking medicines, or having surgery.  If you were prescribed an antibiotic medicine, take it as told by your health care provider. Do not stop taking the antibiotic even if you start to feel better.  Get help right away if you develop severe pain in your abdomen.  Keep all follow-up visits as told by your health care provider.  This is important. This information is not intended to replace advice given to you by your health care provider. Make sure you discuss any questions you have with your health care provider. Document Released: 01/28/2004 Document Revised: 06/22/2017 Document Reviewed: 06/22/2017 Elsevier Patient Education  2020 [employer].

## 2018-12-05 NOTE — Op Note (Signed)
[facility] Patient Name: Brittney [name] Procedure Date: 12/05/2018 1:11 PM MRN: [id number] Date of Birth: 11-13-1970 Attending MD: Hildred Laser , MD CSN: [id number] Age: 48 Admit Type: Outpatient Procedure:                Upper GI endoscopy Indications:              Iron deficiency anemia, Follow-up of                            gastro-esophageal reflux disease Providers:                Hildred Laser, MD, Otis Peak B. Sharon Seller, RN, Raphael Gibney, Technician Referring MD:             Mitzie Na. Quillian Quince, MD Medicines:                Lidocaine spray, Meperidine 75 mg IV, Midazolam 10                            mg IV Complications:            No immediate complications. Estimated Blood Loss:     Estimated blood loss was minimal. Procedure:                Pre-Anesthesia Assessment:                           - Prior to the procedure, a History and Physical                            was performed, and patient medications and                            allergies were reviewed. The patient's tolerance of                            previous anesthesia was also reviewed. The risks                            and benefits of the procedure and the sedation                            options and risks were discussed with the patient.                            All questions were answered, and informed consent                            was obtained. Prior Anticoagulants: The patient has                            taken no previous anticoagulant or antiplatelet  agents. ASA Grade Assessment: III - A patient with                            severe systemic disease. After reviewing the risks                            and benefits, the patient was deemed in                            satisfactory condition to undergo the procedure.                           After obtaining informed consent, the endoscope was                            passed  under direct vision. Throughout the                            procedure, the patient's blood pressure, pulse, and                            oxygen saturations were monitored continuously. The                            [id number]-H190 ([id number]) scope was introduced through the                            mouth, and advanced to the second part of duodenum.                            The upper GI endoscopy was accomplished without                            difficulty. The patient tolerated the procedure                            well. Scope In: 2:05:21 PM Scope Out: 2:14:41 PM Total Procedure Duration: 0 hours 9 minutes 20 seconds  Findings:      The examined esophagus was normal.      The Z-line was irregular and was found 40 cm from the incisors.      Mild portal hypertensive gastropathy was found in the gastric fundus and       in the gastric body.      A few 4 to 8 mm pedunculated and sessile polyps with no stigmata of       recent bleeding were found in the gastric body. Biopsies were taken with       a cold forceps for histology. The pathology specimen was placed into       Bottle Number 2.      The exam of the stomach was otherwise normal.      The duodenal bulb and second portion of the duodenum were normal.       Biopsies were taken from 2nd part of duodenum with a cold forceps for       histology. The  pathology specimen was placed into Bottle Number 1. Impression:               - Normal esophagus.                           - Z-line irregular, 40 cm from the incisors.                           - Portal hypertensive gastropathy.                           - A few gastric polyps. Biopsied.                           - Normal duodenal bulb and second portion of the                            duodenum. Biopsied. Moderate Sedation:      Moderate (conscious) sedation was administered by the endoscopy nurse       and supervised by the endoscopist. The following parameters were        monitored: oxygen saturation, heart rate, blood pressure, CO2       capnography and response to care. Total physician intraservice time was       15 minutes. Recommendation:           - Patient has a contact number available for                            emergencies. The signs and symptoms of potential                            delayed complications were discussed with the                            patient. Return to normal activities tomorrow.                            Written discharge instructions were provided to the                            patient.                           - Resume previous diet today.                           - Continue present medications.                           - No aspirin, ibuprofen, naproxen, or other                            non-steroidal anti-inflammatory drugs for 1 day.                           - Await pathology results. Procedure Code(s):        ---  Professional ---                           [phone], Esophagogastroduodenoscopy, flexible,                            transoral; with biopsy, single or multiple                           G0500, Moderate sedation services provided by the                            same physician or other qualified health care                            professional performing a gastrointestinal                            endoscopic service that sedation supports,                            requiring the presence of an independent trained                            observer to assist in the monitoring of the                            patient's level of consciousness and physiological                            status; initial 15 minutes of intra-service time;                            patient age 8 years or older (additional time may                            be reported with [phone], as appropriate) Diagnosis Code(s):        --- Professional ---                           K22.8, Other specified diseases of  esophagus                           K76.6, Portal hypertension                           K31.89, Other diseases of stomach and duodenum                           K31.7, Polyp of stomach and duodenum                           D50.9, Iron deficiency anemia, unspecified                           K21.9, Gastro-esophageal reflux disease without  esophagitis CPT copyright 2019 American [employer] Association. All rights reserved. The codes documented in this report are preliminary and upon coder review may  be revised to meet current compliance requirements. Hildred Laser, MD Hildred Laser, MD 12/05/2018 2:50:35 PM This report has been signed electronically. Number of Addenda: 0

## 2018-12-05 NOTE — Op Note (Signed)
[facility] Patient Name: Brittney [name] Procedure Date: 12/05/2018 2:17 PM MRN: [id number] Date of Birth: 1970-09-26 Attending MD: Hildred Laser , MD CSN: [id number] Age: 48 Admit Type: Outpatient Procedure:                Colonoscopy Indications:              Iron deficiency anemia, Chronic ulcerative                            proctosigmoiditis, Disease activity assessment of                            chronic ulcerative proctosigmoiditis Providers:                Hildred Laser, MD, Otis Peak B. Sharon Seller, RN, Raphael Gibney, Technician Referring MD:             Mitzie Na. Quillian Quince, MD Medicines:                Meperidine 25 mg IV Complications:            No immediate complications. Estimated Blood Loss:     Estimated blood loss was minimal. Procedure:                Pre-Anesthesia Assessment:                           - Prior to the procedure, a History and Physical                            was performed, and patient medications and                            allergies were reviewed. The patient's tolerance of                            previous anesthesia was also reviewed. The risks                            and benefits of the procedure and the sedation                            options and risks were discussed with the patient.                            All questions were answered, and informed consent                            was obtained. Prior Anticoagulants: The patient has                            taken no previous anticoagulant or antiplatelet  agents. ASA Grade Assessment: III - A patient with                            severe systemic disease. After reviewing the risks                            and benefits, the patient was deemed in                            satisfactory condition to undergo the procedure.                           After obtaining informed consent, the colonoscope      was passed under direct vision. Throughout the                            procedure, the patient's blood pressure, pulse, and                            oxygen saturations were monitored continuously. The                            PCF-H190DL ([id number]) was introduced through the                            anus and advanced to the the terminal ileum, with                            identification of the appendiceal orifice and IC                            valve. The colonoscopy was performed without                            difficulty. The patient tolerated the procedure                            well. The quality of the bowel preparation was                            excellent. The terminal ileum, ileocecal valve,                            appendiceal orifice, and rectum were photographed. Scope In: 2:18:32 PM Scope Out: 2:39:35 PM Scope Withdrawal Time: 0 hours 14 minutes 53 seconds  Total Procedure Duration: 0 hours 21 minutes 3 seconds  Findings:      The perianal and digital rectal examinations were normal.      The terminal ileum appeared normal.      The proximal sigmoid colon, mid sigmoid colon, descending colon, splenic       flexure, transverse colon, hepatic flexure, ascending colon, cecum,       appendiceal orifice and ileocecal valve appeared normal.      An area of mildly congested mucosa was found in the  distal sigmoid       colon. This was biopsied with a cold forceps for histology. The       pathology specimen was placed into Bottle Number 3.      Inflammation was found in the colon. This was graded as Mayo Score 2       (moderate, with marked erythema, absent vascular pattern, friability,       erosions). Biopsies were taken with a cold forceps for histology. The       pathology specimen was placed into Bottle Number 4.      Anal papilla(e) were hypertrophied. Impression:               - The examined portion of the ileum was normal.                           -  The proximal sigmoid colon, mid sigmoid colon,                            descending colon, splenic flexure, transverse                            colon, hepatic flexure, ascending colon, cecum,                            appendiceal orifice and ileocecal valve are normal.                           - Congested mucosa in the distal sigmoid colon.                            Biopsied.                           - Moderately active (Mayo Score 2) ulcerative                            colitis limited to rectum. Biopsied.                           - Anal papilla(e) were hypertrophied. Moderate Sedation:      Moderate (conscious) sedation was administered by the endoscopy nurse       and supervised by the endoscopist. The following parameters were       monitored: oxygen saturation, heart rate, blood pressure, CO2       capnography and response to care. Total physician intraservice time was       25 minutes. Recommendation:           - Patient has a contact number available for                            emergencies. The signs and symptoms of potential                            delayed complications were discussed with the  patient. Return to normal activities tomorrow.                            Written discharge instructions were provided to the                            patient.                           - Resume previous diet today.                           - Continue present medications.                           - No aspirin, ibuprofen, naproxen, or other                            non-steroidal anti-inflammatory drugs for 1 day.                           - Await pathology results.                           - Canasa supp 4 g PR qhs.                           - OV in 3 months.                           - Repeat colonoscopy in 5 years. Procedure Code(s):        --- Professional ---                           [phone], Colonoscopy, flexible; with biopsy, single                             or multiple                           99153, Moderate sedation; each additional 15                            minutes intraservice time                           G0500, Moderate sedation services provided by the                            same physician or other qualified health care                            professional performing a gastrointestinal                            endoscopic service that sedation supports,  requiring the presence of an independent trained                            observer to assist in the monitoring of the                            patient's level of consciousness and physiological                            status; initial 15 minutes of intra-service time;                            patient age 82 years or older (additional time may                            be reported with [phone], as appropriate) Diagnosis Code(s):        --- Professional ---                           K63.89, Other specified diseases of intestine                           K62.89, Other specified diseases of anus and rectum                           D50.9, Iron deficiency anemia, unspecified                           K51.30, Ulcerative (chronic) rectosigmoiditis                            without complications CPT copyright 2019 American Medical Association. All rights reserved. The codes documented in this report are preliminary and upon coder review may  be revised to meet current compliance requirements. Hildred Laser, MD Hildred Laser, MD 12/05/2018 2:57:53 PM This report has been signed electronically. Number of Addenda: 0

## 2018-12-05 NOTE — H&P (Signed)
Brittney [name] is an 48 y.o. female.   Chief Complaint: Patient is here for esophagogastroduodenoscopy and colonoscopy. HPI: Patient is 48 year old Afro-American female who has chronic on double dose PPI with satisfactory symptom control also has history of distal ulcerative colitis which was diagnosed in 1997 and her last colonoscopy was in 2015 revealed mildly active disease.  She has a history of iron deficiency anemia.  She denies frank rectal bleeding or melena.  She may see blood in the tissue occasionally.  She denies vaginal bleeding or hematuria.  She is passing formed stools.  She has had constipation in the past but not presently.  Her appetite is fair.  Her weight has been stable.  She does not take OTC NSAIDs. Family history significant Crohn's disease in the daughter who is 22 years old and is on Humira. Family history is negative for celiac disease or CRC.  Past Medical History:  Diagnosis Date  . Breast cancer (Brittney [name])   . Breast cancer of upper-outer quadrant of right female breast (Brittney [name]) 06/11/2015   Triple negative, 2 cm   . Chemotherapy induced neutropenia ([place]) 09/10/2015  . Diabetes mellitus ([place])    Type 1 over 15 yrs  . Environmental allergies   . GERD (gastroesophageal reflux disease)   . Iron deficiency anemia due to chronic blood loss 06/12/2015  . Personal history of chemotherapy   . Personal history of radiation therapy   . UC (ulcerative colitis confined to sigmoid colon  rectum) Brittney [name])     Past Surgical History:  Procedure Laterality Date  . BREAST LUMPECTOMY Right 2017  . CESAREAN SECTION    . COLONOSCOPY N/A 10/09/2013   Procedure: COLONOSCOPY;  Surgeon: Rogene Houston, MD;  Location: [facility];  Service: Endoscopy;  Laterality: N/A;  100  . DILATION AND CURETTAGE OF UTERUS     x 2 for miscarriages  . ESOPHAGOGASTRODUODENOSCOPY    . FLEXIBLE SIGMOIDOSCOPY    . FLEXIBLE SIGMOIDOSCOPY  07/27/2011   Procedure: FLEXIBLE SIGMOIDOSCOPY;  Surgeon: Rogene Houston, MD;  Location: [facility];  Service: Endoscopy;  Laterality: N/A;  100    Family History  Problem Relation Age of Onset  . Non-Hodgkin's lymphoma Mother   . Hypertension Mother   . Diabetes Mellitus II Mother   . Hypertension Father   . Diabetes Mellitus II Father   . Breast cancer Cousin   . Breast cancer Maternal Aunt   . Colon cancer Maternal Uncle    Social History:  reports that she has never smoked. She has never used smokeless tobacco. She reports that she does not drink alcohol or use drugs.  Allergies:  Allergies  Allergen Reactions  . Ace Inhibitors Itching  . Peanut Oil Itching  . Shellfish Allergy Itching and Rash  . Statins Itching  . Adhesive [Tape] Rash  . Pravastatin Sodium Itching    Medications Prior to Admission  Medication Sig Dispense Refill  . acetaminophen (TYLENOL) 500 MG tablet Take 1,000 mg by mouth every 6 (six) hours as needed. For pain    . acyclovir (ZOVIRAX) 200 MG capsule Take 400 mg by mouth daily. Reported on 07/23/2015    . APRISO 0.375 g 24 hr capsule TAKE FOUR (4) CAPSULES BY MOUTH DAILY. (Patient taking differently: Take 1.5 g by mouth daily. ) 120 capsule 11  . ARIPiprazole (ABILIFY) 2 MG tablet Take 2 mg by mouth at bedtime.    [name] atorvastatin (LIPITOR) 20 MG tablet Take 20 mg by mouth daily.     [name]  citalopram (CELEXA) 20 MG tablet Take 30 mg by mouth daily.     [name] gabapentin (NEURONTIN) 300 MG capsule Take 300-600 mg by mouth See admin instructions. Take 1 capsule (300 mg) by mouth in the morning & take 2 capsules (600 mg) by mouth at night  6  . HUMALOG 100 UNIT/ML injection Inject 8-11 Units into the skin 3 (three) times daily with meals. Sliding Scale Insulin  6  . LANTUS SOLOSTAR 100 UNIT/ML Solostar Pen Inject 24 Units into the skin at bedtime.   6  . loratadine (CLARITIN) 10 MG tablet Take 10 mg by mouth daily.   6  . montelukast (SINGULAIR) 10 MG tablet Take 10 mg by mouth every evening.    [name] omeprazole (PRILOSEC) 20 MG  capsule Take 40 mg by mouth 2 (two) times daily before a meal. Before breakfast & supper    . traZODone (DESYREL) 50 MG tablet Take 50 mg by mouth at bedtime.     . vitamin B-12 (CYANOCOBALAMIN) 1000 MCG tablet Take 1,000 mcg by mouth daily with breakfast.    . albuterol (PROVENTIL HFA;VENTOLIN HFA) 108 (90 Base) MCG/ACT inhaler Inhale 2 puffs into the lungs every 4 (four) hours as needed for wheezing or shortness of breath.     . Olopatadine HCl (PATADAY) 0.2 % SOLN Place 1 drop into both eyes 3 (three) times daily as needed (allergy eyes.).       No results found for this or any previous visit (from the past 48 hour(s)). No results found.  ROS  Blood pressure (!) 192/90, pulse 73, temperature 98.7 F (37.1 C), temperature source Oral, resp. rate 13, height 5' 1"  (1.549 m), weight 83 kg, last menstrual period 09/04/2015, SpO2 100 %. Physical Exam  Constitutional: She appears well-developed and well-nourished.  HENT:  Mouth/Throat: Oropharynx is clear and moist.  Eyes: Conjunctivae are normal. No scleral icterus.  Neck: No thyromegaly present.  Cardiovascular: Normal rate, regular rhythm and normal heart sounds.  No murmur heard. Respiratory: Effort normal and breath sounds normal.  GI: Soft. She exhibits no distension and no mass. There is no abdominal tenderness.  Musculoskeletal:        General: No edema.  Lymphadenopathy:    She has no cervical adenopathy.  Neurological: She is alert.  Skin: Skin is warm and dry.     Assessment/Plan Iron deficiency anemia. Chronic GERD and ulcerative colitis. Diagnostic esophagogastroduodenoscopy and colonoscopy.  Hildred Laser, MD 12/05/2018, 1:54 PM

## 2018-12-06 LAB — GLIADIN ANTIBODIES, SERUM
Antigliadin Abs, IgA: 3 units (ref 0–19)
Gliadin IgG: 3 units (ref 0–19)

## 2018-12-06 LAB — TISSUE TRANSGLUTAMINASE, IGA: Tissue Transglutaminase Ab, IgA: <2 U/mL (ref 0–3)

## 2018-12-07 LAB — RETICULIN ANTIBODIES, IGA W TITER: Reticulin Ab, IgA: NEGATIVE titer (ref <2.5)

## 2018-12-07 LAB — SURGICAL PATHOLOGY

## 2018-12-09 ENCOUNTER — Other Ambulatory Visit (INDEPENDENT_AMBULATORY_CARE_PROVIDER_SITE_OTHER): Payer: Self-pay | Admitting: Internal Medicine

## 2018-12-09 MED ORDER — MESALAMINE 1.2 G PO TBEC: 2.4000 g | DELAYED_RELEASE_TABLET | Freq: Two times a day (BID) | ORAL | 5 refills | Status: DC

## 2018-12-10 ENCOUNTER — Other Ambulatory Visit (INDEPENDENT_AMBULATORY_CARE_PROVIDER_SITE_OTHER): Payer: Self-pay | Admitting: *Deleted

## 2018-12-10 ENCOUNTER — Encounter (HOSPITAL_COMMUNITY): Payer: Self-pay | Admitting: Internal Medicine

## 2018-12-10 DIAGNOSIS — K3184 Gastroparesis: Secondary | ICD-10-CM

## 2018-12-10 NOTE — Progress Notes (Signed)
Order noted

## 2018-12-13 ENCOUNTER — Telehealth: Payer: Self-pay | Admitting: "Endocrinology

## 2018-12-13 ENCOUNTER — Other Ambulatory Visit: Payer: Self-pay | Admitting: "Endocrinology

## 2018-12-13 ENCOUNTER — Other Ambulatory Visit (HOSPITAL_COMMUNITY): Payer: Medicaid Other

## 2018-12-13 DIAGNOSIS — E1065 Type 1 diabetes mellitus with hyperglycemia: Secondary | ICD-10-CM

## 2018-12-13 NOTE — Telephone Encounter (Signed)
What labs does she need? She left a VM to get back on the schedule.

## 2018-12-13 NOTE — Telephone Encounter (Signed)
A1c and CMP order put in

## 2018-12-14 ENCOUNTER — Other Ambulatory Visit (INDEPENDENT_AMBULATORY_CARE_PROVIDER_SITE_OTHER): Payer: Self-pay | Admitting: *Deleted

## 2018-12-14 LAB — COMPREHENSIVE METABOLIC PANEL
AG Ratio: 1.9 (calc) (ref 1.0–2.5)
ALT: 19 U/L (ref 6–29)
AST: 22 U/L (ref 10–35)
Albumin: 4 g/dL (ref 3.6–5.1)
Alkaline phosphatase (APISO): 87 U/L (ref 31–125)
BUN: 9 mg/dL (ref 7–25)
CO2: 30 mmol/L (ref 20–32)
Calcium: 9.2 mg/dL (ref 8.6–10.2)
Chloride: 104 mmol/L (ref 98–110)
Creat: 0.62 mg/dL (ref 0.50–1.10)
Globulin: 2.1 g/dL (calc) (ref 1.9–3.7)
Glucose, Bld: 173 mg/dL — ABNORMAL HIGH (ref 65–139)
Potassium: 4.3 mmol/L (ref 3.5–5.3)
Sodium: 142 mmol/L (ref 135–146)
Total Bilirubin: 0.6 mg/dL (ref 0.2–1.2)
Total Protein: 6.1 g/dL (ref 6.1–8.1)

## 2018-12-14 LAB — HEMOGLOBIN A1C
Hgb A1c MFr Bld: 8.4 % of total Hgb — ABNORMAL HIGH (ref <5.7)
Mean Plasma Glucose: 194 (calc)
eAG (mmol/L): 10.8 (calc)

## 2018-12-14 NOTE — Telephone Encounter (Signed)
Per Insurance the patient will need to use the brand name,"Canasa" instead of the generic.  This was called to [facility] she states that they had ran this 9 days ago.

## 2018-12-18 ENCOUNTER — Encounter: Payer: Self-pay | Admitting: "Endocrinology

## 2018-12-18 ENCOUNTER — Ambulatory Visit (INDEPENDENT_AMBULATORY_CARE_PROVIDER_SITE_OTHER): Payer: Medicaid Other | Admitting: "Endocrinology

## 2018-12-18 DIAGNOSIS — E1065 Type 1 diabetes mellitus with hyperglycemia: Secondary | ICD-10-CM

## 2018-12-18 DIAGNOSIS — E782 Mixed hyperlipidemia: Secondary | ICD-10-CM

## 2018-12-18 NOTE — Progress Notes (Signed)
12/18/2018, 4:15 PM                                                    Endocrinology Telehealth Visit Follow up Note -During COVID -19 Pandemic  This visit type was conducted due to national recommendations for restrictions regarding the COVID-19 Pandemic  in an effort to limit this patient's exposure and mitigate transmission of the corona virus.  Due to her co-morbid illnesses, Brittney [name] is at  moderate to high risk for complications without adequate follow up.  This format is felt to be most appropriate for her at this time.  I connected with this patient on 12/18/2018   by telephone and verified that I am speaking with the correct person using two identifiers. Brittney [name], March 18, 1970. she has verbally consented to this visit. All issues noted in this document were discussed and addressed. The format was not optimal for physical exam.    Subjective:    Patient ID: Brittney [name], female    DOB: [birth year].  Brittney [name] is being engaged in telehealth visit via telephone  in follow-up for management of currently uncontrolled symptomatic diabetes requested by  Brittney Bis, MD.   Past Medical History:  Diagnosis Date  . Breast cancer ([place])   . Breast cancer of upper-outer quadrant of right female breast ([place]) 06/11/2015   Triple negative, 2 cm   . Chemotherapy induced neutropenia ([place]) 09/10/2015  . Diabetes mellitus ([place])    Type 1 over 15 yrs  . Environmental allergies   . GERD (gastroesophageal reflux disease)   . Iron deficiency anemia due to chronic blood loss 06/12/2015  . Personal history of chemotherapy   . Personal history of radiation therapy   . 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 BIOPSY  12/05/2018   Procedure: BIOPSY;  Surgeon: Rogene Houston, MD;  [facility];  Service:  Endoscopy;;  duodenum gastric colon  . BREAST LUMPECTOMY Right 2017  . CESAREAN SECTION    . COLONOSCOPY N/A 10/09/2013   Procedure: COLONOSCOPY;  Surgeon: Rogene Houston, MD;  [facility];  Service: Endoscopy;  Laterality: N/A;  100  . COLONOSCOPY N/A 12/05/2018   Procedure: COLONOSCOPY;  Surgeon: Rogene Houston, MD;  [facility];  Service: Endoscopy;  Laterality: N/A;  . DILATION AND CURETTAGE OF UTERUS     x 2 for miscarriages  . ESOPHAGOGASTRODUODENOSCOPY    . ESOPHAGOGASTRODUODENOSCOPY N/A 12/05/2018   Procedure: ESOPHAGOGASTRODUODENOSCOPY (EGD);  Surgeon: Rogene Houston, MD;  [facility];  Service: Endoscopy;  Laterality: N/A;  200pm  . FLEXIBLE SIGMOIDOSCOPY    . FLEXIBLE SIGMOIDOSCOPY  07/27/2011   Procedure: FLEXIBLE SIGMOIDOSCOPY;  Surgeon: Rogene Houston, MD;  [facility];  Service: Endoscopy;  Laterality: N/A;  100    Social History   Socioeconomic History  .  Marital status: Single    Spouse name: Not on file  . Number of children: Not on file  . Years of education: Not on file  . Highest education level: Not on file  Occupational History  . Not on file  Tobacco Use  . Smoking status: Never Smoker  . Smokeless tobacco: Never Used  Substance and Sexual Activity  . Alcohol use: No    Alcohol/week: 0.0 standard drinks  . Drug use: No  . Sexual activity: Not on file  Other Topics Concern  . Not on file  Social History Narrative  . Not on file   Social Determinants of Health   Financial Resource Strain:   . Difficulty of Paying Living Expenses: Not on file  Food Insecurity:   . Worried About Charity fundraiser in the Last Year: Not on file  . Ran Out of Food in the Last Year: Not on file  Transportation Needs:   . Lack of Transportation (Medical): Not on file  . Lack of Transportation (Non-Medical): Not on file  Physical Activity:   . Days of Exercise per Week: Not on file  . Minutes of Exercise per  Session: Not on file  Stress:   . Feeling of Stress : Not on file  Social Connections:   . Frequency of Communication with Friends and Family: Not on file  . Frequency of Social Gatherings with Friends and Family: Not on file  . Attends Religious Services: Not on file  . Active Member of Clubs or Organizations: Not on file  . Attends Archivist Meetings: Not on file  . Marital Status: Not on file    Family History  Problem Relation Age of Onset  . Non-Hodgkin's lymphoma Mother   . Hypertension Mother   . Diabetes Mellitus II Mother   . Hypertension Father   . Diabetes Mellitus II Father   . Breast cancer Cousin   . Breast cancer Maternal Aunt   . Colon cancer Maternal Uncle     Outpatient Encounter Medications as of 12/18/2018  Medication Sig  . acetaminophen (TYLENOL) 500 MG tablet Take 1,000 mg by mouth every 6 (six) hours as needed. For pain  . acyclovir (ZOVIRAX) 200 MG capsule Take 400 mg by mouth daily. Reported on 07/23/2015  . albuterol (PROVENTIL HFA;VENTOLIN HFA) 108 (90 Base) MCG/ACT inhaler Inhale 2 puffs into the lungs every 4 (four) hours as needed for wheezing or shortness of breath.   . ARIPiprazole (ABILIFY) 2 MG tablet Take 2 mg by mouth at bedtime.  Brittney [name] atorvastatin (LIPITOR) 20 MG tablet Take 20 mg by mouth daily.   . citalopram (CELEXA) 20 MG tablet Take 30 mg by mouth daily.   Brittney [name] gabapentin (NEURONTIN) 300 MG capsule Take 300-600 mg by mouth See admin instructions. Take 1 capsule (300 mg) by mouth in the morning & take 2 capsules (600 mg) by mouth at night  . HUMALOG 100 UNIT/ML injection Inject 8-11 Units into the skin 3 (three) times daily with meals. Sliding Scale Insulin  . LANTUS SOLOSTAR 100 UNIT/ML Solostar Pen Inject 20 Units into the skin at bedtime.  Brittney [name] loratadine (CLARITIN) 10 MG tablet Take 10 mg by mouth daily.   . mesalamine (CANASA) 1000 MG suppository Place 1 suppository (1,000 mg total) rectally at bedtime.  . mesalamine (LIALDA) 1.2 g  EC tablet Take 2 tablets (2.4 g total) by mouth 2 (two) times daily.  . montelukast (SINGULAIR) 10 MG tablet Take 10 mg by mouth every  evening.  . Olopatadine HCl (PATADAY) 0.2 % SOLN Place 1 drop into both eyes 3 (three) times daily as needed (allergy eyes.).   Brittney [name] omeprazole (PRILOSEC) 20 MG capsule Take 40 mg by mouth 2 (two) times daily before a meal. Before breakfast & supper  . traZODone (DESYREL) 50 MG tablet Take 50 mg by mouth at bedtime.   . [DISCONTINUED] vitamin B-12 (CYANOCOBALAMIN) 1000 MCG tablet Take 1,000 mcg by mouth daily with breakfast.   Facility-Administered Encounter Medications as of 12/18/2018  Medication  . 0.9 %  sodium chloride infusion  . 0.9 %  sodium chloride infusion    ALLERGIES: Allergies  Allergen Reactions  . Ace Inhibitors Itching  . Peanut Oil Itching  . Shellfish Allergy Itching and Rash  . Statins Itching  . Adhesive [Tape] Rash  . Pravastatin Sodium Itching    VACCINATION STATUS: Immunization History  Administered Date(s) Administered  . Influenza,inj,Quad PF,6+ Mos 10/22/2015  . Pneumococcal Polysaccharide-23 01/03/2006  . Td 10/04/2006    Diabetes She presents for her follow-up diabetic visit. She has type 1 diabetes mellitus. Onset time: She was diagnosed at approximate age of 15 years. Her disease course has been improving. Pertinent negatives for hypoglycemia include no confusion, nervousness/anxiousness, pallor or seizures. Associated symptoms include fatigue, polydipsia and polyuria. Pertinent negatives for diabetes include no chest pain and no polyphagia. There are no hypoglycemic complications. Symptoms are improving. There are no diabetic complications. Risk factors for coronary artery disease include diabetes mellitus, dyslipidemia, obesity and sedentary lifestyle. Current diabetic treatment includes insulin injections. Her weight is fluctuating minimally. She is following a generally unhealthy diet. When asked about meal planning,  she reported none. She has not had a previous visit with a dietitian. Her home blood glucose trend is fluctuating minimally. Her breakfast blood glucose range is generally 180-200 mg/dl. Her lunch blood glucose range is generally 180-200 mg/dl. Her dinner blood glucose range is generally 180-200 mg/dl. Her bedtime blood glucose range is generally 180-200 mg/dl. Her overall blood glucose range is 180-200 mg/dl. (She uses her Dexcom CGM device, reporting average blood glucose of 180-195 mg per DL.  She also reports rare and random hypoglycemic episodes.) An ACE inhibitor/angiotensin II receptor blocker is not being taken.  Hyperlipidemia This is a chronic problem. The current episode started more than 1 year ago. The problem is controlled. Pertinent negatives include no chest pain, myalgias or shortness of breath. She is currently on no antihyperlipidemic treatment (She was treated with atorvastatin in the past, did not continue on this medication complaining about cost.). Risk factors for coronary artery disease include diabetes mellitus, dyslipidemia, obesity and family history.   Review of systems: Limited as above  Objective:    LMP 09/04/2015 (Approximate)   Wt Readings from Last 3 Encounters:  12/05/18 183 lb (83 kg)  11/20/18 180 lb 3.2 oz (81.7 kg)  10/09/18 185 lb 14.4 oz (84.3 kg)       Recent Results (from the past 2160 hour(s))  CBC with Differential/Platelet     Status: Abnormal   Collection Time: 11/12/18 11:41 AM  Result Value Ref Range   WBC 3.1 (L) 4.0 - 10.5 K/uL   RBC 4.30 3.87 - 5.11 MIL/uL   Hemoglobin 11.7 (L) 12.0 - 15.0 g/dL   HCT 38.2 36.0 - 46.0 %   MCV 88.8 80.0 - 100.0 fL   MCH 27.2 26.0 - 34.0 pg   MCHC 30.6 30.0 - 36.0 g/dL   RDW 13.9 11.5 - 15.5 %  Platelets 162 150 - 400 K/uL   nRBC 0.0 0.0 - 0.2 %   Neutrophils Relative % 46 %   Neutro Abs 1.4 (L) 1.7 - 7.7 K/uL   Lymphocytes Relative 45 %   Lymphs Abs 1.4 0.7 - 4.0 K/uL   Monocytes Relative 9 %    Monocytes Absolute 0.3 0.1 - 1.0 K/uL   Eosinophils Relative 0 %   Eosinophils Absolute 0.0 0.0 - 0.5 K/uL   Basophils Relative 0 %   Basophils Absolute 0.0 0.0 - 0.1 K/uL   Immature Granulocytes 0 %   Abs Immature Granulocytes 0.01 0.00 - 0.07 K/uL    Comment: Performed at [facility], [address], [place], [place] [address]  Comprehensive metabolic panel     Status: Abnormal   Collection Time: 11/12/18 11:41 AM  Result Value Ref Range   Sodium 142 135 - 145 mmol/L   Potassium 4.1 3.5 - 5.1 mmol/L   Chloride 106 98 - 111 mmol/L   CO2 29 22 - 32 mmol/L   Glucose, Bld 127 (H) 70 - 99 mg/dL   BUN 8 6 - 20 mg/dL   Creatinine, Ser 0.50 0.44 - 1.00 mg/dL   Calcium 9.1 8.9 - 10.3 mg/dL   Total Protein 6.7 6.5 - 8.1 g/dL   Albumin 3.9 3.5 - 5.0 g/dL   AST 34 15 - 41 U/L   ALT 35 0 - 44 U/L   Alkaline Phosphatase 89 38 - 126 U/L   Total Bilirubin 1.0 0.3 - 1.2 mg/dL   GFR calc non Af Amer >60 >60 mL/min   GFR calc Af Amer >60 >60 mL/min   Anion gap 7 5 - 15    Comment: Performed at [facility], [address], [place], [place] [address]  Iron and TIBC     Status: None   Collection Time: 11/12/18 11:41 AM  Result Value Ref Range   Iron 68 28 - 170 ug/dL   TIBC 340 250 - 450 ug/dL   Saturation Ratios 20 10.4 - 31.8 %   UIBC 272 ug/dL    Comment: Performed at [facility], [address], [place], [place] [address]  Ferritin     Status: None   Collection Time: 11/12/18 11:41 AM  Result Value Ref Range   Ferritin 107 11 - 307 ng/mL    Comment: Performed at [facility], [address] [place], [place] [address]  Vitamin B12     Status: Abnormal   Collection Time: 11/12/18 11:41 AM  Result Value Ref Range   Vitamin B-12 1,802 (H) 180 - 914 pg/mL    Comment: RESULTS CONFIRMED BY MANUAL DILUTION (NOTE) This assay is not validated for testing neonatal or myeloproliferative syndrome specimens for Vitamin B12 levels. Performed at [facility], [address], [place], [place]  [address]   Folate     Status: None   Collection Time: 11/12/18 11:41 AM  Result Value Ref Range   Folate 10.7 >5.9 ng/mL    Comment: Performed at [facility], [address] [place], [place] [address]  SARS CORONAVIRUS 2 (TAT 6-24 HRS) Nasopharyngeal Nasopharyngeal Swab     Status: None   Collection Time: 12/03/18  7:16 AM   Specimen: Nasopharyngeal Swab  Result Value Ref Range   SARS Coronavirus 2 NEGATIVE NEGATIVE    Comment: (NOTE) SARS-CoV-2 target nucleic acids are NOT DETECTED. The SARS-CoV-2 RNA is generally detectable in upper and lower respiratory specimens during the acute phase of infection. Negative results do  not preclude SARS-CoV-2 infection, do not rule out co-infections with other pathogens, and should not be used as the sole basis for treatment or other patient management decisions. Negative results must be combined with clinical observations, patient history, and epidemiological information. The expected result is Negative. Fact Sheet for Patients: SugarRoll.be Fact Sheet for Healthcare Providers: https://www.woods-mathews.com/ This test is not yet approved or cleared by the [place] FDA and  has been authorized for detection and/or diagnosis of SARS-CoV-2 by FDA under an Emergency Use Authorization (EUA). This EUA will remain  in effect (meaning this test can be used) for the duration of the COVID-19 declaration under Section 56 4(b)(1) of the Act, 21 U.S.C. section 360bbb-3(b)(1), unless the authorization is terminated or revoked sooner. Performed at [facility], [place] [address], [place], [place] [address]   Glucose, capillary     Status: Abnormal   Collection Time: 12/05/18  1:03 PM  Result Value Ref Range   Glucose-Capillary 171 (H) 70 - 99 mg/dL  Surgical pathology     Status: None   Collection Time: 12/05/18  2:11 PM  Result Value Ref Range   SURGICAL PATHOLOGY      SURGICAL PATHOLOGY CASE:  [id number] PATIENT: [facility] Pathology Report     Clinical History: GERD, ulcerative colitis     FINAL MICROSCOPIC DIAGNOSIS:  A. DUODENUM, BIOPSY: - Duodenal mucosa with no significant pathologic findings. - Negative for increased intraepithelial lymphocytes and villous architectural changes.  B. STOMACH, POLYPECTOMY: - Fundic gland polyps.  C. COLON, BIOPSY: - Mildly active chronic colitis. - Negative for granulomata and dysplasia.  D. RECTUM, BIOPSY: - Moderately active chronic colitis. - Negative for granulomata and dysplasia.  GROSS DESCRIPTION:  Specimen A: Received in formalin are tan, soft tissue fragments that are submitted in toto. Number: 4 Size: 0.1 to 0.4 cm Blocks: 1  Specimen B: Received in formalin are tan, soft tissue fragments that are submitted in toto. Number: 4 Size: 0.1 to 0.5 cm Blocks: 1  Specimen C: Received in formalin are tan, soft tissue fragments that are submitted in toto.  Number: 4 Size: 0.2 to 0.4 cm Blocks: 1  Specimen D: Received in formalin are tan, soft tissue fragments that are submitted in toto. Number: 3 Size: 0.2 to 0.5 cm Blocks: 1 (SW 12/06/2018)    Final Diagnosis performed by Gillie Manners, MD.   Electronically signed 12/07/2018 Technical component performed at [facility], [place] [address] [place], [place] [address].  Professional component performed at [employer]. [facility], [place] [address], [place], [place] [address].  Immunohistochemistry Technical component (if applicable) was performed at [facility]. [address] [place], [place], [place] [address].   IMMUNOHISTOCHEMISTRY DISCLAIMER (if applicable): Some of these immunohistochemical stains may have been developed and the performance characteristics determine by [facility]. Some may not have been cleared or approved by the U.S. Food and Drug Administration. The FDA has  determined that such clearance o r approval is not necessary. This test is used for clinical purposes. It should not be regarded as investigational or for research. This laboratory is certified under the [place] (CLIA-88) as qualified to perform high complexity clinical laboratory testing.  The controls stained appropriately.   Vitamin B12     Status: Abnormal   Collection Time: 12/05/18  3:30 PM  Result Value Ref Range   Vitamin B-12 1,402 (H) 180 - 914 pg/mL    Comment: (NOTE)  This assay is not validated for testing neonatal or myeloproliferative syndrome specimens for Vitamin B12 levels. Performed at [facility], [address], [place], [place] [address]   Gliadin antibodies, serum     Status: None   Collection Time: 12/05/18  3:30 PM  Result Value Ref Range   Gliadin IgG 3 0 - 19 units    Comment: (NOTE)                   Negative                   0 - 19                   Weak Positive             20 - 30                   Moderate to Strong Positive   >30 Performed At: [facility] [place], [place] [id number] Rush Farmer MD [id number]    Antigliadin Abs, IgA 3 0 - 19 units    Comment: (NOTE)                   Negative                   0 - 19                   Weak Positive             20 - 30                   Moderate to Strong Positive   >30   Tissue transglutaminase, IgA     Status: None   Collection Time: 12/05/18  3:30 PM  Result Value Ref Range   Tissue Transglutaminase Ab, IgA <2 0 - 3 U/mL    Comment: (NOTE)                              Negative        0 -  3                              Weak Positive   4 - 10                              Positive           >10 Tissue Transglutaminase (tTG) has been identified as the endomysial antigen.  Studies have demonstr- ated that endomysial IgA antibodies have over 99% specificity for gluten sensitive enteropathy. Performed At: [facility] [place], [place] [id number] Rush Farmer MD [id number]   Reticulin Antibody, IgA w reflex titer     Status: None   Collection Time: 12/05/18  3:30 PM  Result Value Ref Range   Reticulin Ab, IgA Negative Neg:<1:2.5 titer    Comment: (NOTE) IgA class reticulin antibodies are specific for celiac disease and occur in 60% of patients with active disease. Results for this test are for research purposes only by the assay's manufacturer.  The performance characteristics of this product have not been established.  Results should not be used as a diagnostic procedure without confirmation of the diagnosis by another medically established diagnostic product  or procedure. Performed At: [facility] [place], [place] [id number] Rush Farmer MD [id number]   C-reactive protein     Status: None   Collection Time: 12/05/18  3:30 PM  Result Value Ref Range   CRP <0.8 <1.0 mg/dL    Comment: Performed at [facility], [address], [place], [place] [address]  Comprehensive metabolic panel     Status: Abnormal   Collection Time: 12/13/18  9:11 AM  Result Value Ref Range   Glucose, Bld 173 (H) 65 - 139 mg/dL    Comment: .        Non-fasting reference interval .    BUN 9 7 - 25 mg/dL   Creat 0.62 0.50 - 1.10 mg/dL   BUN/Creatinine Ratio NOT APPLICABLE 6 - 22 (calc)   Sodium 142 135 - 146 mmol/L   Potassium 4.3 3.5 - 5.3 mmol/L   Chloride 104 98 - 110 mmol/L   CO2 30 20 - 32 mmol/L   Calcium 9.2 8.6 - 10.2 mg/dL   Total Protein 6.1 6.1 - 8.1 g/dL   Albumin 4.0 3.6 - 5.1 g/dL   Globulin 2.1 1.9 - 3.7 g/dL (calc)   AG Ratio 1.9 1.0 - 2.5 (calc)   Total Bilirubin 0.6 0.2 - 1.2 mg/dL   Alkaline phosphatase (APISO) 87 31 - 125 U/L   AST 22 10 - 35 U/L   ALT 19 6 - 29 U/L  Hemoglobin A1c     Status: Abnormal   Collection Time: 12/13/18  9:11 AM  Result Value Ref Range   Hgb A1c MFr Bld 8.4 (H) <5.7 % of total Hgb    Comment: For someone  without known diabetes, a hemoglobin A1c value of 6.5% or greater indicates that they may have  diabetes and this should be confirmed with a follow-up  test. . For someone with known diabetes, a value <7% indicates  that their diabetes is well controlled and a value  greater than or equal to 7% indicates suboptimal  control. A1c targets should be individualized based on  duration of diabetes, age, comorbid conditions, and  other considerations. . Currently, no consensus exists regarding use of hemoglobin A1c for diagnosis of diabetes for children. .    Mean Plasma Glucose 194 (calc)   eAG (mmol/L) 10.8 (calc)     Diabetic Labs (most recent): Lab Results  Component Value Date   HGBA1C 8.4 (H) 12/13/2018   HGBA1C 7.7 (H) 09/14/2018   HGBA1C 9.0 (H) 05/23/2018     Lipid Panel ( most recent) Lipid Panel     Component Value Date/Time   CHOL 154 01/30/2018 0000   TRIG 59 01/30/2018 0000   HDL 66 01/30/2018 0000   LDLCALC 76 01/30/2018 0000     Assessment & Plan:   1. Uncontrolled type 1 diabetes mellitus with hyperglycemia (HCC)  - Verneice S Heslin has currently uncontrolled symptomatic type 2 DM since 48 years of age. -Her most recent A1c is 8.4%, overall improving from 9%.  Her recent anti-GAD antibody elevated at 61 suggesting type 1 diabetes.   -her diabetes is complicated by obesity/sedentary life and she remains at a high risk for more acute and chronic complications which include CAD, CVA, CKD, retinopathy, and neuropathy. These are all discussed in detail with her.  - I have counseled her on diet management and weight loss, by adopting a carbohydrate restricted/protein rich diet.  - she  admits there is a room for improvement in her diet and  drink choices. -  Suggestion is made for her to avoid simple carbohydrates  from her diet including Cakes, Sweet Desserts / Pastries, Ice Cream, Soda (diet and regular), Sweet Tea, Candies, Chips, Cookies, Sweet Pastries,   Store Bought Juices, Alcohol in Excess of  1-2 drinks a day, Artificial Sweeteners, Coffee Creamer, and "Sugar-free" Products. This will help patient to have stable blood glucose profile and potentially avoid unintended weight gain.   - I encouraged her to switch to  unprocessed or minimally processed complex starch and increased protein intake (animal or plant source), fruits, and vegetables.  - she is advised to stick to a routine mealtimes to eat 3 meals  a day and avoid unnecessary snacks ( to snack only to correct hypoglycemia).     - I have approached her with the following individualized plan to manage diabetes and patient agrees:   -She will continue to need intensive treatment with basal/bolus insulin in order for her to achieve and maintain control of diabetes to target. -Due to her tendency for random hypoglycemia, she is advised to lower her Lantus to 20 units nightly, continue Humalog  8 units 3 times a day with meals  for pre-meal BG readings of 70-189m/dl, plus patient specific correction dose for unexpected hyperglycemia above 1528mdl, associated with strict monitoring of glucose 4 times a day-before meals and at bedtime. - she is warned not to take insulin without proper monitoring per orders. - Adjustment parameters are given to her for hypo and hyperglycemia in writing. - she is encouraged to call clinic for blood glucose levels less than 70 or above 300 mg /dl. -She is asked to send in printout of her Dexcom CGM device.  - she is not a candidate for metformin, SGLT2 inhibitors, nor incretin therapy.   - Patient specific target  A1c;  LDL, HDL, Triglycerides, and  Waist Circumference were discussed in detail.  2) Blood Pressure /Hypertension: Her blood pressure was controlled to target.  She is not on anti-hypertensive medications for now.  3) Lipids/Hyperlipidemia:   Review of her recent lipid panel showed uncontrolled LDL at 119.  She is advised to continue atorvastatin  20 mg p.o. nightly.    4)  Weight/Diet: Her BMI is 34-   clearly complicating her diabetes care.  I discussed with her the fact that loss of 5 - 10% of her  current body weight will have the most impact on her diabetes management.  CDE Consult will be initiated . Exercise, and detailed carbohydrates information provided  -  detailed on discharge instructions.  5) Chronic Care/Health Maintenance:  -she  Is not on ACEI/ARB and Statin medications and  is encouraged to initiate and continue to follow up with Ophthalmology, Dentist,  Podiatrist at least yearly or according to recommendations, and advised to   stay away from smoking. I have recommended yearly flu vaccine and pneumonia vaccine at least every 5 years; moderate intensity exercise for up to 150 minutes weekly; and  sleep for at least 7 hours a day.  - she is  advised to maintain close follow up with DaCaryl BisMD for primary care needs, as well as her other providers for optimal and coordinated care.  - Patient Care Time Today:  25 min, of which >50% was spent in  counseling and the rest reviewing her  current and  previous labs/studies, previous treatments, her blood glucose readings, and medications' doses and developing a plan for long-term care based on the latest recommendations for  standards of care.   Brittney [name] participated in the discussions, expressed understanding, and voiced agreement with the above plans.  All questions were answered to her satisfaction. she is encouraged to contact clinic should she have any questions or concerns prior to her return visit.   Follow up plan: - Return in about 4 months (around 04/18/2019) for Bring Meter and Logs- A1c in Office.  Glade Lloyd, MD [facility] [address] [place], [place] [address] Phone: [phone]  Fax: [phone]    12/18/2018, 4:15 PM  This note was partially dictated with voice recognition software.  Similar sounding words can be transcribed inadequately or may not  be corrected upon review.

## 2018-12-19 ENCOUNTER — Encounter (HOSPITAL_COMMUNITY): Admission: RE | Admit: 2018-12-19 | Payer: Medicaid Other | Source: Ambulatory Visit

## 2018-12-20 ENCOUNTER — Telehealth (INDEPENDENT_AMBULATORY_CARE_PROVIDER_SITE_OTHER): Payer: Self-pay | Admitting: *Deleted

## 2018-12-20 ENCOUNTER — Ambulatory Visit (HOSPITAL_COMMUNITY): Payer: Medicaid Other

## 2018-12-20 NOTE — Telephone Encounter (Signed)
You wanted patient to have GES - she has had to cancel twice due to her sugar dropping and she had to eat before going - prep is NPO after midnight - she wants to know is there something else she can do -- please advise

## 2018-12-23 NOTE — Telephone Encounter (Signed)
Please call Brittney [name] and find out if we still have smart pill or wireless motility capsule. I have not used probably in 7 to 8 years.

## 2018-12-24 NOTE — Telephone Encounter (Signed)
Talked to [place], we only do smart pill

## 2018-12-25 NOTE — Telephone Encounter (Signed)
GES sch'd 01/02/19 at 830, patient aware - per Dr Laural Golden patient needs to take half normal dose of sliding scale at supper meal evening before test and decrease Lantus to 12-14 units at bedtime whichever patient is comfortable with taking and have a big snack around 1130, if sugar low morning of test drink sugary drink - patient aware

## 2018-12-25 NOTE — Telephone Encounter (Signed)
It does not look like that Smart pill study would be a good idea as she has to be fasting for 6 hours. If she develops hypoglycemia she can drink fructose or sugar drink and I do not believe it should impact the result of emptying study.

## 2019-01-01 ENCOUNTER — Ambulatory Visit (HOSPITAL_COMMUNITY): Admission: RE | Admit: 2019-01-01 | Payer: Medicaid Other | Source: Ambulatory Visit

## 2019-01-09 ENCOUNTER — Encounter (HOSPITAL_COMMUNITY): Payer: Medicaid Other

## 2019-03-01 ENCOUNTER — Ambulatory Visit (INDEPENDENT_AMBULATORY_CARE_PROVIDER_SITE_OTHER): Payer: Medicaid Other | Admitting: "Endocrinology

## 2019-03-01 ENCOUNTER — Encounter: Payer: Self-pay | Admitting: "Endocrinology

## 2019-03-01 DIAGNOSIS — E782 Mixed hyperlipidemia: Secondary | ICD-10-CM | POA: Diagnosis not present

## 2019-03-01 DIAGNOSIS — E1065 Type 1 diabetes mellitus with hyperglycemia: Secondary | ICD-10-CM | POA: Diagnosis not present

## 2019-03-01 NOTE — Progress Notes (Signed)
03/01/2019, 1:32 PM                                                      Endocrinology Telehealth Visit Follow up Note -During COVID -19 Pandemic  This visit type was conducted due to national recommendations for restrictions regarding the COVID-19 Pandemic  in an effort to limit this patient's exposure and mitigate transmission of the corona virus.  Due to her co-morbid illnesses, Brittney [name] is at  moderate to high risk for complications without adequate follow up.  This format is felt to be most appropriate for her at this time.  I connected with this patient on 03/01/2019   by telephone and verified that I am speaking with the correct person using two identifiers. Brittney [name], Aug 06, 1970. she has verbally consented to this visit. All issues noted in this document were discussed and addressed. The format was not optimal for physical exam.     Subjective:    Patient ID: Brittney [name], female    DOB: [birth year].  Brittney [name] is being engaged in telehealth visit via telephone  in follow-up for management of currently uncontrolled symptomatic diabetes requested by  [name], MD.   Past Medical History:  Diagnosis Date  . Breast cancer ([place])   . Breast cancer of upper-outer quadrant of right female breast ([place]) 06/11/2015   Triple negative, 2 cm   . Chemotherapy induced neutropenia ([place]) 09/10/2015  . Diabetes mellitus ([place])    Type 1 over 15 yrs  . Environmental allergies   . GERD (gastroesophageal reflux disease)   . Iron deficiency anemia due to chronic blood loss 06/12/2015  . Personal history of chemotherapy   . Personal history of radiation therapy   . 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 BIOPSY  12/05/2018   Procedure: BIOPSY;  Surgeon: Rogene Houston, MD;  [facility];  Service:  Endoscopy;;  duodenum gastric colon  . BREAST LUMPECTOMY Right 2017  . CESAREAN SECTION    . COLONOSCOPY N/A 10/09/2013   Procedure: COLONOSCOPY;  Surgeon: Rogene Houston, MD;  [facility];  Service: Endoscopy;  Laterality: N/A;  100  . COLONOSCOPY N/A 12/05/2018   Procedure: COLONOSCOPY;  Surgeon: Rogene Houston, MD;  [facility];  Service: Endoscopy;  Laterality: N/A;  . DILATION AND CURETTAGE OF UTERUS     x 2 for miscarriages  . ESOPHAGOGASTRODUODENOSCOPY    . ESOPHAGOGASTRODUODENOSCOPY N/A 12/05/2018   Procedure: ESOPHAGOGASTRODUODENOSCOPY (EGD);  Surgeon: Rogene Houston, MD;  [facility];  Service: Endoscopy;  Laterality: N/A;  200pm  . FLEXIBLE SIGMOIDOSCOPY    . FLEXIBLE SIGMOIDOSCOPY  07/27/2011   Procedure: FLEXIBLE SIGMOIDOSCOPY;  Surgeon: Rogene Houston, MD;  [facility];  Service: Endoscopy;  Laterality: N/A;  100    Social History  Socioeconomic History  . Marital status: Single    Spouse name: Not on file  . Number of children: Not on file  . Years of education: Not on file  . Highest education level: Not on file  Occupational History  . Not on file  Tobacco Use  . Smoking status: Never Smoker  . Smokeless tobacco: Never Used  Substance and Sexual Activity  . Alcohol use: No    Alcohol/week: 0.0 standard drinks  . Drug use: No  . Sexual activity: Not on file  Other Topics Concern  . Not on file  Social History Narrative  . Not on file   Social Determinants of Health   Financial Resource Strain:   . Difficulty of Paying Living Expenses: Not on file  Food Insecurity:   . Worried About Charity fundraiser in the Last Year: Not on file  . Ran Out of Food in the Last Year: Not on file  Transportation Needs:   . Lack of Transportation (Medical): Not on file  . Lack of Transportation (Non-Medical): Not on file  Physical Activity:   . Days of Exercise per Week: Not on file  . Minutes of Exercise per  Session: Not on file  Stress:   . Feeling of Stress : Not on file  Social Connections:   . Frequency of Communication with Friends and Family: Not on file  . Frequency of Social Gatherings with Friends and Family: Not on file  . Attends Religious Services: Not on file  . Active Member of Clubs or Organizations: Not on file  . Attends Archivist Meetings: Not on file  . Marital Status: Not on file    Family History  Problem Relation Age of Onset  . Non-Hodgkin's lymphoma Mother   . Hypertension Mother   . Diabetes Mellitus II Mother   . Hypertension Father   . Diabetes Mellitus II Father   . Breast cancer Cousin   . Breast cancer Maternal Aunt   . Colon cancer Maternal Uncle     Outpatient Encounter Medications as of 03/01/2019  Medication Sig  . acetaminophen (TYLENOL) 500 MG tablet Take 1,000 mg by mouth every 6 (six) hours as needed. For pain  . acyclovir (ZOVIRAX) 200 MG capsule Take 400 mg by mouth daily. Reported on 07/23/2015  . albuterol (PROVENTIL HFA;VENTOLIN HFA) 108 (90 Base) MCG/ACT inhaler Inhale 2 puffs into the lungs every 4 (four) hours as needed for wheezing or shortness of breath.   . ARIPiprazole (ABILIFY) 2 MG tablet Take 2 mg by mouth at bedtime.  [name] atorvastatin (LIPITOR) 20 MG tablet Take 20 mg by mouth daily.   . citalopram (CELEXA) 20 MG tablet Take 30 mg by mouth daily.   [name] gabapentin (NEURONTIN) 300 MG capsule Take 300-600 mg by mouth See admin instructions. Take 1 capsule (300 mg) by mouth in the morning & take 2 capsules (600 mg) by mouth at night  . HUMALOG 100 UNIT/ML injection Inject 8-11 Units into the skin 3 (three) times daily with meals. Sliding Scale Insulin  . LANTUS SOLOSTAR 100 UNIT/ML Solostar Pen Inject 20 Units into the skin at bedtime.  [name] loratadine (CLARITIN) 10 MG tablet Take 10 mg by mouth daily.   . mesalamine (CANASA) 1000 MG suppository Place 1 suppository (1,000 mg total) rectally at bedtime.  . mesalamine (LIALDA) 1.2 g  EC tablet Take 2 tablets (2.4 g total) by mouth 2 (two) times daily.  . montelukast (SINGULAIR) 10 MG tablet Take 10  mg by mouth every evening.  . Olopatadine HCl (PATADAY) 0.2 % SOLN Place 1 drop into both eyes 3 (three) times daily as needed (allergy eyes.).   [name] omeprazole (PRILOSEC) 20 MG capsule Take 40 mg by mouth 2 (two) times daily before a meal. Before breakfast & supper  . traZODone (DESYREL) 50 MG tablet Take 50 mg by mouth at bedtime.    Facility-Administered Encounter Medications as of 03/01/2019  Medication  . 0.9 %  sodium chloride infusion  . 0.9 %  sodium chloride infusion    ALLERGIES: Allergies  Allergen Reactions  . Ace Inhibitors Itching  . Peanut Oil Itching  . Shellfish Allergy Itching and Rash  . Statins Itching  . Adhesive [Tape] Rash  . Pravastatin Sodium Itching    VACCINATION STATUS: Immunization History  Administered Date(s) Administered  . Influenza,inj,Quad PF,6+ Mos 10/22/2015  . Pneumococcal Polysaccharide-23 01/03/2006  . Td 10/04/2006    Diabetes She presents for her follow-up diabetic visit. She has type 1 diabetes mellitus. Onset time: She was diagnosed at approximate age of 46 years. Her disease course has been improving. Pertinent negatives for hypoglycemia include no confusion, nervousness/anxiousness, pallor or seizures. Associated symptoms include fatigue, polydipsia and polyuria. Pertinent negatives for diabetes include no chest pain and no polyphagia. There are no hypoglycemic complications. Symptoms are improving. There are no diabetic complications. Risk factors for coronary artery disease include diabetes mellitus, dyslipidemia, obesity and sedentary lifestyle. Current diabetic treatment includes insulin injections. Her weight is fluctuating minimally. She is following a generally unhealthy diet. When asked about meal planning, she reported none. She has not had a previous visit with a dietitian. Her home blood glucose trend is fluctuating  minimally. Her breakfast blood glucose range is generally 140-180 mg/dl. Her lunch blood glucose range is generally 180-200 mg/dl. Her dinner blood glucose range is generally 140-180 mg/dl. Her bedtime blood glucose range is generally 180-200 mg/dl. Her overall blood glucose range is 180-200 mg/dl. (She uses her Dexcom CGM device, reporting average blood glucose of 174-190 mg per DL.  She denies hypoglycemia.   ) An ACE inhibitor/angiotensin II receptor blocker is not being taken.  Hyperlipidemia This is a chronic problem. The current episode started more than 1 year ago. The problem is controlled. Pertinent negatives include no chest pain, myalgias or shortness of breath. She is currently on no antihyperlipidemic treatment (She was treated with atorvastatin in the past, did not continue on this medication complaining about cost.). Risk factors for coronary artery disease include diabetes mellitus, dyslipidemia, obesity and family history.   Review of systems: Limited as above.  Objective:    LMP 09/04/2015 (Approximate)   Wt Readings from Last 3 Encounters:  12/05/18 183 lb (83 kg)  11/20/18 180 lb 3.2 oz (81.7 kg)  10/09/18 185 lb 14.4 oz (84.3 kg)       Recent Results (from the past 2160 hour(s))  SARS CORONAVIRUS 2 (TAT 6-24 HRS) Nasopharyngeal Nasopharyngeal Swab     Status: None   Collection Time: 12/03/18  7:16 AM   Specimen: Nasopharyngeal Swab  Result Value Ref Range   SARS Coronavirus 2 NEGATIVE NEGATIVE    Comment: (NOTE) SARS-CoV-2 target nucleic acids are NOT DETECTED. The SARS-CoV-2 RNA is generally detectable in upper and lower respiratory specimens during the acute phase of infection. Negative results do not preclude SARS-CoV-2 infection, do not rule out co-infections with other pathogens, and should not be used as the sole basis for treatment or other patient management decisions. Negative results  must be combined with clinical observations, patient history, and  epidemiological information. The expected result is Negative. Fact Sheet for Patients: SugarRoll.be Fact Sheet for Healthcare Providers: https://www.woods-mathews.com/ This test is not yet approved or cleared by the [place] FDA and  has been authorized for detection and/or diagnosis of SARS-CoV-2 by FDA under an Emergency Use Authorization (EUA). This EUA will remain  in effect (meaning this test can be used) for the duration of the COVID-19 declaration under Section 56 4(b)(1) of the Act, 21 U.S.C. section 360bbb-3(b)(1), unless the authorization is terminated or revoked sooner. Performed at [facility], [place] [address], [place], [place] [address]   Glucose, capillary     Status: Abnormal   Collection Time: 12/05/18  1:03 PM  Result Value Ref Range   Glucose-Capillary 171 (H) 70 - 99 mg/dL  Surgical pathology     Status: None   Collection Time: 12/05/18  2:11 PM  Result Value Ref Range   SURGICAL PATHOLOGY      SURGICAL PATHOLOGY CASE: [id number] PATIENT: [facility] [facility] Pathology Report     Clinical History: GERD, ulcerative colitis     FINAL MICROSCOPIC DIAGNOSIS:  A. DUODENUM, BIOPSY: - Duodenal mucosa with no significant pathologic findings. - Negative for increased intraepithelial lymphocytes and villous architectural changes.  B. STOMACH, POLYPECTOMY: - Fundic gland polyps.  C. COLON, BIOPSY: - Mildly active chronic colitis. - Negative for granulomata and dysplasia.  D. RECTUM, BIOPSY: - Moderately active chronic colitis. - Negative for granulomata and dysplasia.  GROSS DESCRIPTION:  Specimen A: Received in formalin are tan, soft tissue fragments that are submitted in toto. Number: 4 Size: 0.1 to 0.4 cm Blocks: 1  Specimen B: Received in formalin are tan, soft tissue fragments that are submitted in toto. Number: 4 Size: 0.1 to 0.5 cm Blocks: 1  Specimen C: Received in formalin are  tan, soft tissue fragments that are submitted in toto.  Number: 4 Size: 0.2 to 0.4 cm Blocks: 1  Specimen D: Received in formalin are tan, soft tissue fragments that are submitted in toto. Number: 3 Size: 0.2 to 0.5 cm Blocks: 1 (SW 12/06/2018)    Final Diagnosis performed by Gillie Manners, MD.   Electronically signed 12/07/2018 Technical component performed at [facility], [place] [address] [place], [place] [address].  Professional component performed at [employer]. [facility], [place] [address] [place], [place] [address].  Immunohistochemistry Technical component (if applicable) was performed at [facility]. [address] [place], [place], [place] [address].   IMMUNOHISTOCHEMISTRY DISCLAIMER (if applicable): Some of these immunohistochemical stains may have been developed and the performance characteristics determine by [facility]. Some may not have been cleared or approved by the U.S. Food and Drug Administration. The FDA has determined that such clearance o r approval is not necessary. This test is used for clinical purposes. It should not be regarded as investigational or for research. This laboratory is certified under the [place] [employer] (CLIA-88) as qualified to perform high complexity clinical laboratory testing.  The controls stained appropriately.   Vitamin B12     Status: Abnormal   Collection Time: 12/05/18  3:30 PM  Result Value Ref Range   Vitamin B-12 1,402 (H) 180 - 914 pg/mL    Comment: (NOTE) This assay is not validated for testing neonatal or myeloproliferative syndrome specimens for Vitamin B12 levels. Performed at [facility], [address], [place], [place] [address]   Johny Chess  antibodies, serum     Status: None   Collection Time: 12/05/18  3:30 PM  Result Value Ref Range   Gliadin IgG 3 0 - 19 units    Comment: (NOTE)                   Negative                    0 - 19                   Weak Positive             20 - 30                   Moderate to Strong Positive   >30 Performed At: [facility] [place], [place] [id number] Rush Farmer MD QQ:[id number]    Antigliadin Abs, IgA 3 0 - 19 units    Comment: (NOTE)                   Negative                   0 - 19                   Weak Positive             20 - 30                   Moderate to Strong Positive   >30   Tissue transglutaminase, IgA     Status: None   Collection Time: 12/05/18  3:30 PM  Result Value Ref Range   Tissue Transglutaminase Ab, IgA <2 0 - 3 U/mL    Comment: (NOTE)                              Negative        0 -  3                              Weak Positive   4 - 10                              Positive           >10 Tissue Transglutaminase (tTG) has been identified as the endomysial antigen.  Studies have demonstr- ated that endomysial IgA antibodies have over 99% specificity for gluten sensitive enteropathy. Performed At: [facility] [place], [place] [id number] Rush Farmer MD [id number]   Reticulin Antibody, IgA w reflex titer     Status: None   Collection Time: 12/05/18  3:30 PM  Result Value Ref Range   Reticulin Ab, IgA Negative Neg:<1:2.5 titer    Comment: (NOTE) IgA class reticulin antibodies are specific for celiac disease and occur in 60% of patients with active disease. Results for this test are for research purposes only by the assay's manufacturer.  The performance characteristics of this product have not been established.  Results should not be used as a diagnostic procedure without confirmation of the diagnosis by another medically established diagnostic product or procedure. Performed At: [facility] [address] [place], [place] [id number] Rush Farmer MD KG:[id number]   C-reactive protein     Status: None   Collection  Time: 12/05/18  3:30 PM  Result Value Ref Range    CRP <0.8 <1.0 mg/dL    Comment: Performed at [facility], [address], [place], [place] [address]  Comprehensive metabolic panel     Status: Abnormal   Collection Time: 12/13/18  9:11 AM  Result Value Ref Range   Glucose, Bld 173 (H) 65 - 139 mg/dL    Comment: .        Non-fasting reference interval .    BUN 9 7 - 25 mg/dL   Creat 0.62 0.50 - 1.10 mg/dL   BUN/Creatinine Ratio NOT APPLICABLE 6 - 22 (calc)   Sodium 142 135 - 146 mmol/L   Potassium 4.3 3.5 - 5.3 mmol/L   Chloride 104 98 - 110 mmol/L   CO2 30 20 - 32 mmol/L   Calcium 9.2 8.6 - 10.2 mg/dL   Total Protein 6.1 6.1 - 8.1 g/dL   Albumin 4.0 3.6 - 5.1 g/dL   Globulin 2.1 1.9 - 3.7 g/dL (calc)   AG Ratio 1.9 1.0 - 2.5 (calc)   Total Bilirubin 0.6 0.2 - 1.2 mg/dL   Alkaline phosphatase (APISO) 87 31 - 125 U/L   AST 22 10 - 35 U/L   ALT 19 6 - 29 U/L  Hemoglobin A1c     Status: Abnormal   Collection Time: 12/13/18  9:11 AM  Result Value Ref Range   Hgb A1c MFr Bld 8.4 (H) <5.7 % of total Hgb    Comment: For someone without known diabetes, a hemoglobin A1c value of 6.5% or greater indicates that they may have  diabetes and this should be confirmed with a follow-up  test. . For someone with known diabetes, a value <7% indicates  that their diabetes is well controlled and a value  greater than or equal to 7% indicates suboptimal  control. A1c targets should be individualized based on  duration of diabetes, age, comorbid conditions, and  other considerations. . Currently, no consensus exists regarding use of hemoglobin A1c for diagnosis of diabetes for children. .    Mean Plasma Glucose 194 (calc)   eAG (mmol/L) 10.8 (calc)     Diabetic Labs (most recent): Lab Results  Component Value Date   HGBA1C 8.4 (H) 12/13/2018   HGBA1C 7.7 (H) 09/14/2018   HGBA1C 9.0 (H) 05/23/2018     Lipid Panel ( most recent) Lipid Panel     Component Value Date/Time   CHOL 154 01/30/2018 0000   TRIG 59 01/30/2018 0000    HDL 66 01/30/2018 0000   LDLCALC 76 01/30/2018 0000     Assessment & Plan:   1. Uncontrolled type 1 diabetes mellitus with hyperglycemia (HCC)  - Sulay S Altman has currently uncontrolled symptomatic type 2 DM since 49 years of age. -She reports improving glycemic profile.  Her most recent A1c was 8.4% slightly improving from 9%.   Her recent anti-GAD antibody elevated at 61 suggesting type 1 diabetes.   -her diabetes is complicated by obesity/sedentary life and she remains at a high risk for more acute and chronic complications which include CAD, CVA, CKD, retinopathy, and neuropathy. These are all discussed in detail with her.  - I have counseled her on diet management and weight loss, by adopting a carbohydrate restricted/protein rich diet.  - she  admits there is a room for improvement in her diet and drink choices. -  Suggestion is made for her to avoid simple carbohydrates  from her diet including Cakes, Sweet Desserts /  Pastries, Ice Cream, Soda (diet and regular), Sweet Tea, Candies, Chips, Cookies, Sweet Pastries,  Store Bought Juices, Alcohol in Excess of  1-2 drinks a day, Artificial Sweeteners, Coffee Creamer, and "Sugar-free" Products. This will help patient to have stable blood glucose profile and potentially avoid unintended weight gain.   - I encouraged her to switch to  unprocessed or minimally processed complex starch and increased protein intake (animal or plant source), fruits, and vegetables.  - she is advised to stick to a routine mealtimes to eat 3 meals  a day and avoid unnecessary snacks ( to snack only to correct hypoglycemia).     - I have approached her with the following individualized plan to manage diabetes and patient agrees:   -She will continue to need intensive treatment with basal/bolus insulin in order for her to achieve and maintain control of diabetes to target. -Due to her tendency for random hypoglycemia, she is advised to continue Lantus 20  units nightly, continue Humalog 8 units 3 times a day with meals  for pre-meal BG readings of 70-116m/dl, plus patient specific correction dose for unexpected hyperglycemia above 1521mdl, associated with strict monitoring of glucose 4 times a day-before meals and at bedtime. - she is warned not to take insulin without proper monitoring per orders. - Adjustment parameters are given to her for hypo and hyperglycemia in writing. - she is encouraged to call clinic for blood glucose levels less than 70 or above 300 mg /dl. -She is asked to send in printout of her Dexcom CGM device.  - she is not a candidate for metformin, SGLT2 inhibitors, nor incretin therapy.   - Patient specific target  A1c;  LDL, HDL, Triglycerides, and  Waist Circumference were discussed in detail.  2) Blood Pressure /Hypertension: Her blood pressure is controlled to target.   She is not on anti-hypertensive medications for now.  3) Lipids/Hyperlipidemia:   Review of her recent lipid panel showed uncontrolled LDL at 119.  She is advised to continue atorvastatin 20 mg p.o. nightly.    4)  Weight/Diet: Her BMI is 34-   clearly complicating her diabetes care.  I discussed with her the fact that loss of 5 - 10% of her  current body weight will have the most impact on her diabetes management.  CDE Consult will be initiated . Exercise, and detailed carbohydrates information provided  -  detailed on discharge instructions.  5) Chronic Care/Health Maintenance:  -she  Is not on ACEI/ARB and Statin medications and  is encouraged to initiate and continue to follow up with Ophthalmology, Dentist,  Podiatrist at least yearly or according to recommendations, and advised to   stay away from smoking. I have recommended yearly flu vaccine and pneumonia vaccine at least every 5 years; moderate intensity exercise for up to 150 minutes weekly; and  sleep for at least 7 hours a day.  - she is  advised to maintain close follow up with DaCaryl BisMD for primary care needs, as well as her other providers for optimal and coordinated care.  - Time spent on this patient care encounter:  35 min, of which >50% was spent in  counseling and the rest reviewing her  current and  previous labs/studies ( including abstraction from other facilities),  previous treatments, her blood glucose readings, and medications' doses and developing a plan for long-term care based on the latest recommendations for standards of care; and documenting her care.  AnMarliss Czararticipated in the  discussions, expressed understanding, and voiced agreement with the above plans.  All questions were answered to her satisfaction. she is encouraged to contact clinic should she have any questions or concerns prior to her return visit.   Follow up plan: - Return in about 3 months (around 05/29/2019) for Bring Meter and Logs- A1c in Office.  Glade Lloyd, MD [facility] [address] [place], [place] [address] Phone: [phone]  Fax: [phone]    03/01/2019, 1:32 PM  This note was partially dictated with voice recognition software. Similar sounding words can be transcribed inadequately or may not  be corrected upon review.

## 2019-03-01 NOTE — Patient Instructions (Signed)

## 2019-04-23 ENCOUNTER — Ambulatory Visit: Payer: Medicaid Other | Admitting: "Endocrinology

## 2019-05-27 ENCOUNTER — Other Ambulatory Visit (INDEPENDENT_AMBULATORY_CARE_PROVIDER_SITE_OTHER): Payer: Self-pay | Admitting: Internal Medicine

## 2019-05-31 ENCOUNTER — Encounter: Payer: Self-pay | Admitting: "Endocrinology

## 2019-05-31 ENCOUNTER — Other Ambulatory Visit: Payer: Self-pay

## 2019-05-31 ENCOUNTER — Ambulatory Visit (INDEPENDENT_AMBULATORY_CARE_PROVIDER_SITE_OTHER): Payer: Medicaid Other | Admitting: "Endocrinology

## 2019-05-31 VITALS — BP 130/82 | HR 80 | Ht 61.0 in | Wt 180.0 lb

## 2019-05-31 DIAGNOSIS — E782 Mixed hyperlipidemia: Secondary | ICD-10-CM

## 2019-05-31 DIAGNOSIS — E1065 Type 1 diabetes mellitus with hyperglycemia: Secondary | ICD-10-CM | POA: Diagnosis not present

## 2019-05-31 LAB — POCT GLYCOSYLATED HEMOGLOBIN (HGB A1C): Hemoglobin A1C: 8.1 % — AB (ref 4.0–5.6)

## 2019-05-31 NOTE — Progress Notes (Signed)
05/31/2019, 12:30 PM     Endocrinology follow-up note  Subjective:    Patient ID: Brittney [name], female    DOB: Oct 01, 1970.  Brittney [name] is being seen in follow-up  for management of currently uncontrolled symptomatic diabetes requested by  [name], MD.   Past Medical History:  Diagnosis Date  . Breast cancer ([place])   . Breast cancer of upper-outer quadrant of right female breast ([place]) 06/11/2015   Triple negative, 2 cm   . Chemotherapy induced neutropenia ([place]) 09/10/2015  . Diabetes mellitus ([place])    Type 1 over 15 yrs  . Environmental allergies   . GERD (gastroesophageal reflux disease)   . Iron deficiency anemia due to chronic blood loss 06/12/2015  . Personal history of chemotherapy   . Personal history of radiation therapy   . 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) [facility])     Past Surgical History:  Procedure Laterality Date  . BIOPSY  12/05/2018   Procedure: BIOPSY;  Surgeon: Rogene Houston, MD;  [facility];  Service: Endoscopy;;  duodenum gastric colon  . BREAST LUMPECTOMY Right 2017  . CESAREAN SECTION    . COLONOSCOPY N/A 10/09/2013   Procedure: COLONOSCOPY;  Surgeon: Rogene Houston, MD;  [facility];  Service: Endoscopy;  Laterality: N/A;  100  . COLONOSCOPY N/A 12/05/2018   Procedure: COLONOSCOPY;  Surgeon: Rogene Houston, MD;  [facility];  Service: Endoscopy;  Laterality: N/A;  . DILATION AND CURETTAGE OF UTERUS     x 2 for miscarriages  . ESOPHAGOGASTRODUODENOSCOPY    . ESOPHAGOGASTRODUODENOSCOPY N/A 12/05/2018   Procedure: ESOPHAGOGASTRODUODENOSCOPY (EGD);  Surgeon: Rogene Houston, MD;  [facility];  Service: Endoscopy;  Laterality: N/A;  200pm  . FLEXIBLE SIGMOIDOSCOPY    . FLEXIBLE SIGMOIDOSCOPY  07/27/2011   Procedure: FLEXIBLE SIGMOIDOSCOPY;  Surgeon: Rogene Houston, MD;  [facility];   Service: Endoscopy;  Laterality: N/A;  100    Social History   Socioeconomic History  . Marital status: Single    Spouse name: Not on file  . Number of children: Not on file  . Years of education: Not on file  . Highest education level: Not on file  Occupational History  . Not on file  Tobacco Use  . Smoking status: Never Smoker  . Smokeless tobacco: Never Used  Substance and Sexual Activity  . Alcohol use: No    Alcohol/week: 0.0 standard drinks  . Drug use: No  . Sexual activity: Not on file  Other Topics Concern  . Not on file  Social History Narrative  . Not on file   Social Determinants of Health   Financial Resource Strain:   . Difficulty of Paying Living Expenses:   Food Insecurity:   . Worried About Charity fundraiser in the Last Year:   . Arboriculturist in the Last Year:   Transportation Needs:   . Film/video editor (Medical):   [name] Lack of Transportation (Non-Medical):   Physical Activity:   . Days of Exercise per Week:   .  Minutes of Exercise per Session:   Stress:   . Feeling of Stress :   Social Connections:   . Frequency of Communication with Friends and Family:   . Frequency of Social Gatherings with Friends and Family:   . Attends Religious Services:   . Active Member of Clubs or Organizations:   . Attends Archivist Meetings:   [name] Marital Status:     Family History  Problem Relation Age of Onset  . Non-Hodgkin's lymphoma Mother   . Hypertension Mother   . Diabetes Mellitus II Mother   . Hypertension Father   . Diabetes Mellitus II Father   . Breast cancer Cousin   . Breast cancer Maternal Aunt   . Colon cancer Maternal Uncle     Outpatient Encounter Medications as of 05/31/2019  Medication Sig  . Multiple Vitamin (MULTIVITAMIN ADULT PO) Take 1 tablet by mouth daily.  [name] acetaminophen (TYLENOL) 500 MG tablet Take 1,000 mg by mouth every 6 (six) hours as needed. For pain  . acyclovir (ZOVIRAX) 200 MG capsule Take 400 mg by  mouth daily. Reported on 07/23/2015  . albuterol (PROVENTIL HFA;VENTOLIN HFA) 108 (90 Base) MCG/ACT inhaler Inhale 2 puffs into the lungs every 4 (four) hours as needed for wheezing or shortness of breath.   . ARIPiprazole (ABILIFY) 2 MG tablet Take 2 mg by mouth at bedtime.  [name] atorvastatin (LIPITOR) 20 MG tablet Take 20 mg by mouth daily.   . citalopram (CELEXA) 20 MG tablet Take 30 mg by mouth daily.   [name] gabapentin (NEURONTIN) 300 MG capsule Take 300-600 mg by mouth See admin instructions. Take 1 capsule (300 mg) by mouth in the morning & take 2 capsules (600 mg) by mouth at night  . HUMALOG 100 UNIT/ML injection Inject 8-11 Units into the skin 3 (three) times daily with meals. Sliding Scale Insulin  . LANTUS SOLOSTAR 100 UNIT/ML Solostar Pen Inject 24 Units into the skin at bedtime.  [name] LIALDA 1.2 g EC tablet TAKE TWO (2) TABLETS BY MOUTH TWICE DAILY.  [name] loratadine (CLARITIN) 10 MG tablet Take 10 mg by mouth daily.   . mesalamine (CANASA) 1000 MG suppository Place 1 suppository (1,000 mg total) rectally at bedtime.  . montelukast (SINGULAIR) 10 MG tablet Take 10 mg by mouth every evening.  . Olopatadine HCl (PATADAY) 0.2 % SOLN Place 1 drop into both eyes 3 (three) times daily as needed (allergy eyes.).   [name] omeprazole (PRILOSEC) 20 MG capsule Take 40 mg by mouth 2 (two) times daily before a meal. Before breakfast & supper  . traZODone (DESYREL) 50 MG tablet Take 50 mg by mouth at bedtime.    Facility-Administered Encounter Medications as of 05/31/2019  Medication  . 0.9 %  sodium chloride infusion  . 0.9 %  sodium chloride infusion    ALLERGIES: Allergies  Allergen Reactions  . Ace Inhibitors Itching  . Peanut Oil Itching  . Shellfish Allergy Itching and Rash  . Statins Itching  . Adhesive [Tape] Rash  . Pravastatin Sodium Itching    VACCINATION STATUS: Immunization History  Administered Date(s) Administered  . Influenza,inj,Quad PF,6+ Mos 10/22/2015  . Pneumococcal  Polysaccharide-23 01/03/2006  . Td 10/04/2006    Diabetes She presents for her follow-up diabetic visit. She has type 1 diabetes mellitus. Onset time: She was diagnosed at approximate age of 78 years. Her disease course has been improving. Pertinent negatives for hypoglycemia include no confusion, nervousness/anxiousness, pallor or seizures. Associated symptoms include fatigue, polydipsia and polyuria.  Pertinent negatives for diabetes include no chest pain and no polyphagia. There are no hypoglycemic complications. Symptoms are improving. There are no diabetic complications. Risk factors for coronary artery disease include diabetes mellitus, dyslipidemia, obesity and sedentary lifestyle. Current diabetic treatment includes insulin injections. Her weight is fluctuating minimally. She is following a generally unhealthy diet. When asked about meal planning, she reported none. She has not had a previous visit with a dietitian. Her home blood glucose trend is fluctuating dramatically. Her breakfast blood glucose range is generally >200 mg/dl. Her lunch blood glucose range is generally 180-200 mg/dl. Her dinner blood glucose range is generally 180-200 mg/dl. Her bedtime blood glucose range is generally >200 mg/dl. Her overall blood glucose range is >200 mg/dl. (She uses her Dexcom CGM device, continues to have significantly fluctuating glycemic profile.  She has been having them, moderate to severe hypoglycemia.  Her point-of-care A1c is 8.1%, overall improving from 9%.   ) An ACE inhibitor/angiotensin II receptor blocker is not being taken.  Hyperlipidemia This is a chronic problem. The current episode started more than 1 year ago. The problem is controlled. Pertinent negatives include no chest pain, myalgias or shortness of breath. Current antihyperlipidemic treatment includes statins (She was treated with atorvastatin in the past, did not continue on this medication complaining about cost.). Risk factors for  coronary artery disease include diabetes mellitus, dyslipidemia, obesity and family history.    Review of systems  Constitutional: + Minimally fluctuating body weight,  current  Body mass index is 34.01 kg/m. , no fatigue, no subjective hyperthermia, no subjective hypothermia Eyes: no blurry vision, no xerophthalmia ENT: no sore throat, no nodules palpated in throat, no dysphagia/odynophagia, no hoarseness Cardiovascular: no Chest Pain, no Shortness of Breath, no palpitations, no leg swelling Respiratory: no cough, no shortness of breath Gastrointestinal: no Nausea/Vomiting/Diarhhea Musculoskeletal: no muscle/joint aches Skin: no rashes, no hyperemia Neurological: no tremors, no numbness, no tingling, no dizziness Psychiatric: no depression, no anxiety   Objective:    BP 130/82   Pulse 80   Ht 5' 1"  (1.549 m)   Wt 180 lb (81.6 kg)   LMP 09/04/2015 (Approximate)   BMI 34.01 kg/m   Wt Readings from Last 3 Encounters:  05/31/19 180 lb (81.6 kg)  12/05/18 183 lb (83 kg)  11/20/18 180 lb 3.2 oz (81.7 kg)      Physical Exam- Limited  Constitutional:  Body mass index is 34.01 kg/m. , not in acute distress, normal state of mind Eyes:  EOMI, no exophthalmos Neck: Supple Thyroid: No gross goiter Respiratory: Adequate breathing efforts Musculoskeletal: no gross deformities, strength intact in all four extremities, no gross restriction of joint movements Skin:  no rashes, no hyperemia Neurological: no tremor with outstretched hands,    Recent Results (from the past 2160 hour(s))  HgB A1c     Status: Abnormal   Collection Time: 05/31/19 11:16 AM  Result Value Ref Range   Hemoglobin A1C 8.1 (A) 4.0 - 5.6 %   HbA1c POC (<> result, manual entry)     HbA1c, POC (prediabetic range)     HbA1c, POC (controlled diabetic range)       Diabetic Labs (most recent): Lab Results  Component Value Date   HGBA1C 8.1 (A) 05/31/2019   HGBA1C 8.4 (H) 12/13/2018   HGBA1C 7.7 (H)  09/14/2018     Lipid Panel ( most recent) Lipid Panel     Component Value Date/Time   CHOL 154 01/30/2018 0000   TRIG 59 01/30/2018 0000  HDL 66 01/30/2018 0000   LDLCALC 76 01/30/2018 0000   CMP Latest Ref Rng & Units 12/13/2018 11/12/2018 09/14/2018  Glucose 65 - 139 mg/dL 173(H) 127(H) 277(H)  BUN 7 - 25 mg/dL 9 8 8   Creatinine 0.50 - 1.10 mg/dL 0.62 0.50 0.57  Sodium 135 - 146 mmol/L 142 142 140  Potassium 3.5 - 5.3 mmol/L 4.3 4.1 4.7  Chloride 98 - 110 mmol/L 104 106 104  CO2 20 - 32 mmol/L 30 29 30   Calcium 8.6 - 10.2 mg/dL 9.2 9.1 9.2  Total Protein 6.1 - 8.1 g/dL 6.1 6.7 6.3  Total Bilirubin 0.2 - 1.2 mg/dL 0.6 1.0 0.9  Alkaline Phos 38 - 126 U/L - 89 -  AST 10 - 35 U/L 22 34 18  ALT 6 - 29 U/L 19 35 19     Assessment & Plan:   1. Uncontrolled type 1 diabetes mellitus with hyperglycemia (HCC)  - Kayly S Gonsalves has currently uncontrolled symptomatic type 2 DM since 49 years of age.  She uses her Dexcom CGM device, continues to have significantly fluctuating glycemic profile.  She has been having them, moderate to severe hypoglycemia.  Her point-of-care A1c is 8.1%, overall improving from 9%.      Her recent anti-GAD antibody elevated at 61 suggesting type 1 diabetes.   -her diabetes is complicated by obesity/sedentary life and she remains at a high risk for more acute and chronic complications which include CAD, CVA, CKD, retinopathy, and neuropathy. These are all discussed in detail with her.  - I have counseled her on diet management and weight loss, by adopting a carbohydrate restricted/protein rich diet.  -  Suggestion is made for her to avoid simple carbohydrates  from her diet including Cakes, Sweet Desserts / Pastries, Ice Cream, Soda (diet and regular), Sweet Tea, Candies, Chips, Cookies, Sweet Pastries,  Store Bought Juices, Alcohol in Excess of  1-2 drinks a day, Artificial Sweeteners, Coffee Creamer, and "Sugar-free" Products. This will help patient  to have stable blood glucose profile and potentially avoid unintended weight gain.   - I encouraged her to switch to  unprocessed or minimally processed complex starch and increased protein intake (animal or plant source), fruits, and vegetables.  - she is advised to stick to a routine mealtimes to eat 3 meals  a day and avoid unnecessary snacks ( to snack only to correct hypoglycemia).    - I have approached her with the following individualized plan to manage diabetes and patient agrees:   -She we will continue to need intensive treatment with basal/bolus insulin in order for her to achieve and maintain control of diabetes to target.    -Due to her tendency for random hypoglycemia, priority will be to avoid hypoglycemia in her care.    -She is advised to increase her Lantus slightly to 24 units nightly to address her fasting hyperglycemia, continue Humalog 8 units 3 times a day with meals  for pre-meal BG readings of 90-141m/dl, plus patient specific correction dose for unexpected hyperglycemia above 1579mdl, associated with strict monitoring of glucose 4 times a day-before meals and at bedtime. - she is warned not to take insulin without proper monitoring per orders. - Adjustment parameters are given to her for hypo and hyperglycemia in writing. - she is encouraged to call clinic for blood glucose levels less than 70 or above 300 mg /dl. -She is encouraged to continue to use her Dexcom at all times.  She is not a suitable insulin  pump candidate at this time.  -She is  advised to adjust her Humalog on days that she was exercise.  - she is not a somewhat lost metformin, SGLT2 inhibitors, nor incretin therapy.   - Patient specific target  A1c;  LDL, HDL, Triglycerides, and  Waist Circumference were discussed in detail.  2) Blood Pressure /Hypertension:  She is her blood pressure is controlled to target.  She is not on anti-hypertensive medications for now.  3) Lipids/Hyperlipidemia:  Review of her most recent lipid panel showed LDL improved to 76 from 119.  She is benefiting from atorvastatin, advised to continue  atorvastatin 20 mg p.o. nightly.    4)  Weight/Diet: Her BMI is 34-   clearly complicating her diabetes care.  A candidate for modest weight loss, may avoid further weight gain by avoiding unnecessary snacks.  I discussed with her the fact that loss of 5 - 10% of her  current body weight will have the most impact on her diabetes management.   CDE Consult will be initiated . Exercise, and detailed carbohydrates information provided  -  detailed on discharge instructions.  5) Chronic Care/Health Maintenance:  -she  Is not on ACEI/ARB and Statin medications and  is encouraged to initiate and continue to follow up with Ophthalmology, Dentist,  Podiatrist at least yearly or according to recommendations, and advised to   stay away from smoking. I have recommended yearly flu vaccine and pneumonia vaccine at least every 5 years; moderate intensity exercise for up to 150 minutes weekly; and  sleep for at least 7 hours a day.  - she is  advised to maintain close follow up with [name], MD for primary care needs, as well as her other providers for optimal and coordinated care.  - Time spent on this patient care encounter:  35 min, of which > 50% was spent in  counseling and the rest reviewing her blood glucose logs , discussing her hypoglycemia and hyperglycemia episodes, reviewing her current and  previous labs / studies  ( including abstraction from other facilities) and medications  doses and developing a  long term treatment plan and documenting her care.   Please refer to Patient Instructions for Blood Glucose Monitoring and Insulin/Medications Dosing Guide"  in media tab for additional information. Please  also refer to " Patient Self Inventory" in the Media  tab for reviewed elements of pertinent patient history.  [name] participated in the discussions,  expressed understanding, and voiced agreement with the above plans.  All questions were answered to her satisfaction. she is encouraged to contact clinic should she have any questions or concerns prior to her return visit.   Follow up plan: - Return in about 3 months (around 08/31/2019) for F/U with Pre-visit Labs, Meter, Logs, A1c here.Glade Lloyd, MD [facility] [address] [place], [place] [address] Phone: [phone]  Fax: [phone]    05/31/2019, 12:30 PM  This note was partially dictated with voice recognition software. Similar sounding words can be transcribed inadequately or may not  be corrected upon review.

## 2019-05-31 NOTE — Patient Instructions (Signed)

## 2019-06-04 ENCOUNTER — Encounter (HOSPITAL_COMMUNITY): Payer: Self-pay

## 2019-06-04 ENCOUNTER — Other Ambulatory Visit: Payer: Self-pay

## 2019-06-04 ENCOUNTER — Inpatient Hospital Stay (HOSPITAL_COMMUNITY): Payer: Medicaid Other | Attending: Hematology

## 2019-06-04 ENCOUNTER — Encounter (HOSPITAL_COMMUNITY): Payer: Medicaid Other

## 2019-06-04 ENCOUNTER — Ambulatory Visit (HOSPITAL_COMMUNITY)
Admission: RE | Admit: 2019-06-04 | Discharge: 2019-06-04 | Disposition: A | Discharge status: Home / Self Care | Payer: Medicaid Other | Source: Ambulatory Visit | Attending: Nurse Practitioner | Admitting: Nurse Practitioner

## 2019-06-04 DIAGNOSIS — Z79899 Other long term (current) drug therapy: Secondary | ICD-10-CM | POA: Diagnosis not present

## 2019-06-04 DIAGNOSIS — E119 Type 2 diabetes mellitus without complications: Secondary | ICD-10-CM | POA: Diagnosis not present

## 2019-06-04 DIAGNOSIS — Z923 Personal history of irradiation: Secondary | ICD-10-CM | POA: Diagnosis not present

## 2019-06-04 DIAGNOSIS — Z794 Long term (current) use of insulin: Secondary | ICD-10-CM | POA: Insufficient documentation

## 2019-06-04 DIAGNOSIS — Z171 Estrogen receptor negative status [ER-]: Secondary | ICD-10-CM | POA: Diagnosis not present

## 2019-06-04 DIAGNOSIS — E538 Deficiency of other specified B group vitamins: Secondary | ICD-10-CM | POA: Diagnosis not present

## 2019-06-04 DIAGNOSIS — Z800 Family history of malignant neoplasm of digestive organs: Secondary | ICD-10-CM | POA: Insufficient documentation

## 2019-06-04 DIAGNOSIS — C50411 Malignant neoplasm of upper-outer quadrant of right female breast: Secondary | ICD-10-CM

## 2019-06-04 DIAGNOSIS — K51911 Ulcerative colitis, unspecified with rectal bleeding: Secondary | ICD-10-CM | POA: Insufficient documentation

## 2019-06-04 DIAGNOSIS — Z8249 Family history of ischemic heart disease and other diseases of the circulatory system: Secondary | ICD-10-CM | POA: Diagnosis not present

## 2019-06-04 DIAGNOSIS — Z833 Family history of diabetes mellitus: Secondary | ICD-10-CM | POA: Insufficient documentation

## 2019-06-04 DIAGNOSIS — Z9221 Personal history of antineoplastic chemotherapy: Secondary | ICD-10-CM | POA: Diagnosis not present

## 2019-06-04 DIAGNOSIS — Z807 Family history of other malignant neoplasms of lymphoid, hematopoietic and related tissues: Secondary | ICD-10-CM | POA: Insufficient documentation

## 2019-06-04 DIAGNOSIS — K219 Gastro-esophageal reflux disease without esophagitis: Secondary | ICD-10-CM | POA: Diagnosis not present

## 2019-06-04 DIAGNOSIS — Z803 Family history of malignant neoplasm of breast: Secondary | ICD-10-CM | POA: Diagnosis not present

## 2019-06-04 DIAGNOSIS — D509 Iron deficiency anemia, unspecified: Secondary | ICD-10-CM | POA: Insufficient documentation

## 2019-06-04 DIAGNOSIS — F329 Major depressive disorder, single episode, unspecified: Secondary | ICD-10-CM | POA: Insufficient documentation

## 2019-06-04 LAB — CBC WITH DIFFERENTIAL/PLATELET
Abs Immature Granulocytes: 0.01 10*3/uL (ref 0.00–0.07)
Basophils Absolute: 0 10*3/uL (ref 0.0–0.1)
Basophils Relative: 0 %
Eosinophils Absolute: 0 10*3/uL (ref 0.0–0.5)
Eosinophils Relative: 0 %
HCT: 39.6 % (ref 36.0–46.0)
Hemoglobin: 12.3 g/dL (ref 12.0–15.0)
Immature Granulocytes: 0 %
Lymphocytes Relative: 31 %
Lymphs Abs: 1.2 10*3/uL (ref 0.7–4.0)
MCH: 27.6 pg (ref 26.0–34.0)
MCHC: 31.1 g/dL (ref 30.0–36.0)
MCV: 88.8 fL (ref 80.0–100.0)
Monocytes Absolute: 0.3 10*3/uL (ref 0.1–1.0)
Monocytes Relative: 8 %
Neutro Abs: 2.3 10*3/uL (ref 1.7–7.7)
Neutrophils Relative %: 61 %
Platelets: 166 10*3/uL (ref 150–400)
RBC: 4.46 MIL/uL (ref 3.87–5.11)
RDW: 14.1 % (ref 11.5–15.5)
WBC: 3.8 10*3/uL — ABNORMAL LOW (ref 4.0–10.5)
nRBC: 0 % (ref 0.0–0.2)

## 2019-06-04 LAB — LACTATE DEHYDROGENASE: LDH: 166 U/L (ref 98–192)

## 2019-06-04 LAB — COMPREHENSIVE METABOLIC PANEL
ALT: 32 U/L (ref 0–44)
AST: 28 U/L (ref 15–41)
Albumin: 3.9 g/dL (ref 3.5–5.0)
Alkaline Phosphatase: 111 U/L (ref 38–126)
Anion gap: 11 (ref 5–15)
BUN: 11 mg/dL (ref 6–20)
CO2: 29 mmol/L (ref 22–32)
Calcium: 9.1 mg/dL (ref 8.9–10.3)
Chloride: 103 mmol/L (ref 98–111)
Creatinine, Ser: 0.6 mg/dL (ref 0.44–1.00)
GFR calc Af Amer: >60 mL/min (ref >60)
GFR calc non Af Amer: >60 mL/min (ref >60)
Glucose, Bld: 207 mg/dL — ABNORMAL HIGH (ref 70–99)
Potassium: 3.9 mmol/L (ref 3.5–5.1)
Sodium: 143 mmol/L (ref 135–145)
Total Bilirubin: 0.9 mg/dL (ref 0.3–1.2)
Total Protein: 6.9 g/dL (ref 6.5–8.1)

## 2019-06-04 LAB — FERRITIN: Ferritin: 90 ng/mL (ref 11–307)

## 2019-06-04 LAB — IRON AND TIBC
Iron: 91 ug/dL (ref 28–170)
Saturation Ratios: 25 % (ref 10.4–31.8)
TIBC: 359 ug/dL (ref 250–450)
UIBC: 268 ug/dL

## 2019-06-04 LAB — VITAMIN B12: Vitamin B-12: 736 pg/mL (ref 180–914)

## 2019-06-04 LAB — VITAMIN D 25 HYDROXY (VIT D DEFICIENCY, FRACTURES): Vit D, 25-Hydroxy: 25.04 ng/mL — ABNORMAL LOW (ref 30–100)

## 2019-06-04 LAB — FOLATE: Folate: 12.5 ng/mL (ref >5.9)

## 2019-06-06 ENCOUNTER — Inpatient Hospital Stay (HOSPITAL_BASED_OUTPATIENT_CLINIC_OR_DEPARTMENT_OTHER): Payer: Medicaid Other | Admitting: Nurse Practitioner

## 2019-06-06 DIAGNOSIS — C50411 Malignant neoplasm of upper-outer quadrant of right female breast: Secondary | ICD-10-CM

## 2019-06-06 NOTE — Progress Notes (Signed)
Brittney [name], [place] [address]   CLINIC:  Medical Oncology/Hematology  PCP:  [name], MD [place] [address] [phone]   REASON FOR VISIT: Follow-up for breast cancer and iron deficiency anemia   CURRENT THERAPY: Surveillance and intermittent iron infusions  BRIEF ONCOLOGIC HISTORY:  Oncology History  Malignant neoplasm of upper-outer quadrant of right female breast ([place])  04/29/2015 Initial Biopsy   upper outer quadrant mass R breast 2.9 cm by ultrasound, high grade invasive ductal carcinoma ER< 1%, PR 1%, HER -2 not amplified, Ki-67 66%   05/13/2015 Cancer Staging   pT1cN0   05/13/2015 Surgery   lumpectomy and sentinel node biopsies X 3, 2 cm tumor, 3 negative nodes    05/21/2015 Imaging   CT [facility] with postsurgical changes related to R breast lympectomy and R axillary LN dissection, no findings suspicious for metastatic disease   06/12/2015 Procedure   Port placement by Dr. Anthony Sar   06/17/2015 Imaging   MUGA- Normal left ventricular ejection fraction equal to 62.9%.   06/25/2015 - 08/13/2015 Chemotherapy   AC x 4   08/27/2015 - 09/03/2015 Chemotherapy   Weekly Taxol x 2 cycles    09/11/2015 - 09/15/2015 Hospital Admission   Neutropenic fever, ARF secondary to antibiotics   10/01/2015 - 11/12/2015 Chemotherapy   Taxotere 75 mg/m2 Q3 weeks x 3 cycles     10/01/2015 Treatment Plan Change   Taxol changed to Taxotere given hospitalization.     - 01/14/2016 Radiation Therapy   Radiation in [facility]   05/11/2016 Mammogram   IMPRESSION: No evidence of malignancy within either breast. Expected postsurgical changes within the right breast.      CANCER STAGING: Cancer Staging Malignant neoplasm of upper-outer quadrant of right female breast [facility]) Staging form: Breast, AJCC 7th Edition - Clinical stage from 05/13/2015: Stage IA (T1c, N0, M0) - Signed by Baird Cancer, PA-C on 06/25/2015    INTERVAL  HISTORY:  Brittney [name] 49 y.o. female returns for routine follow-up for breast cancer and iron deficiency anemia.  Patient reports she is been more fatigued than normal.  She has a hard time getting started in the mornings.  She also has depression that plays a factor in this as well.  She denies any bright red bleeding per rectum or melena.  She denies any easy bruising or bleeding.  She denies any new lumps or bumps present.  She denies any new bone pain. Denies any nausea, vomiting, or diarrhea. Denies any new pains. Had not noticed any recent bleeding such as epistaxis, hematuria or hematochezia. Denies recent chest pain on exertion, shortness of breath on minimal exertion, pre-syncopal episodes, or palpitations. Denies any numbness or tingling in hands or feet. Denies any recent fevers, infections, or recent hospitalizations. Patient reports appetite at 100% and energy level at 50%.  She is eating well maintain her weight at this time.     REVIEW OF SYSTEMS:  Review of Systems  Constitutional: Positive for fatigue.  All other systems reviewed and are negative.    PAST MEDICAL/SURGICAL HISTORY:  Past Medical History:  Diagnosis Date  . Breast cancer ([place])   . Breast cancer of upper-outer quadrant of right female breast ([place]) 06/11/2015   Triple negative, 2 cm   . Chemotherapy induced neutropenia ([place]) 09/10/2015  . Diabetes mellitus ([place])    Type 1 over 15 yrs  . Environmental allergies   . GERD (gastroesophageal reflux disease)   .  Iron deficiency anemia due to chronic blood loss 06/12/2015  . Personal history of chemotherapy   . Personal history of radiation therapy   . 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 BIOPSY  12/05/2018   Procedure: BIOPSY;  Surgeon: Rogene Houston, MD;  [facility];  Service: Endoscopy;;  duodenum gastric colon  . BREAST LUMPECTOMY Right 2017  . CESAREAN SECTION    . COLONOSCOPY  N/A 10/09/2013   Procedure: COLONOSCOPY;  Surgeon: Rogene Houston, MD;  Location: [facility];  Service: Endoscopy;  Laterality: N/A;  100  . COLONOSCOPY N/A 12/05/2018   Procedure: COLONOSCOPY;  Surgeon: Rogene Houston, MD;  Location: [facility];  Service: Endoscopy;  Laterality: N/A;  . DILATION AND CURETTAGE OF UTERUS     x 2 for miscarriages  . ESOPHAGOGASTRODUODENOSCOPY    . ESOPHAGOGASTRODUODENOSCOPY N/A 12/05/2018   Procedure: ESOPHAGOGASTRODUODENOSCOPY (EGD);  Surgeon: Rogene Houston, MD;  Location: [facility];  Service: Endoscopy;  Laterality: N/A;  200pm  . FLEXIBLE SIGMOIDOSCOPY    . FLEXIBLE SIGMOIDOSCOPY  07/27/2011   Procedure: FLEXIBLE SIGMOIDOSCOPY;  Surgeon: Rogene Houston, MD;  Location: [facility];  Service: Endoscopy;  Laterality: N/A;  100     SOCIAL HISTORY:  Social History   Socioeconomic History  . Marital status: Single    Spouse name: Not on file  . Number of children: Not on file  . Years of education: Not on file  . Highest education level: Not on file  Occupational History  . Not on file  Tobacco Use  . Smoking status: Never Smoker  . Smokeless tobacco: Never Used  Substance and Sexual Activity  . Alcohol use: No    Alcohol/week: 0.0 standard drinks  . Drug use: No  . Sexual activity: Not on file  Other Topics Concern  . Not on file  Social History Narrative  . Not on file   Social Determinants of Health   Financial Resource Strain:   . Difficulty of Paying Living Expenses:   Food Insecurity:   . Worried About Charity fundraiser in the Last Year:   . Arboriculturist in the Last Year:   Transportation Needs:   . Film/video editor (Medical):   [name] Lack of Transportation (Non-Medical):   Physical Activity:   . Days of Exercise per Week:   . Minutes of Exercise per Session:   Stress:   . Feeling of Stress :   Social Connections:   . Frequency of Communication with Friends and Family:   . Frequency of Social Gatherings  with Friends and Family:   . Attends Religious Services:   . Active Member of Clubs or Organizations:   . Attends Archivist Meetings:   [name] Marital Status:   Intimate Partner Violence:   . Fear of Current or Ex-Partner:   . Emotionally Abused:   [name] Physically Abused:   . Sexually Abused:     FAMILY HISTORY:  Family History  Problem Relation Age of Onset  . Non-Hodgkin's lymphoma Mother   . Hypertension Mother   . Diabetes Mellitus II Mother   . Hypertension Father   . Diabetes Mellitus II Father   . Breast cancer Cousin   . Breast cancer Maternal Aunt   . Colon cancer Maternal Uncle     CURRENT MEDICATIONS:  Outpatient Encounter Medications as of 06/06/2019  Medication Sig  .  acyclovir (ZOVIRAX) 200 MG capsule Take 400 mg by mouth daily. Reported on 07/23/2015  . ARIPiprazole (ABILIFY) 2 MG tablet Take 2 mg by mouth at bedtime.  [name] atorvastatin (LIPITOR) 20 MG tablet Take 20 mg by mouth daily.   . citalopram (CELEXA) 20 MG tablet Take 30 mg by mouth daily. Pt takes 1 1/2 tablet daily  . gabapentin (NEURONTIN) 300 MG capsule Take 300-600 mg by mouth See admin instructions. Take 1 capsule (300 mg) by mouth in the morning & take 2 capsules (600 mg) by mouth at night  . HUMALOG 100 UNIT/ML injection Inject 8-11 Units into the skin 3 (three) times daily with meals. Sliding Scale Insulin  . LANTUS SOLOSTAR 100 UNIT/ML Solostar Pen Inject 24 Units into the skin at bedtime.  [name] LIALDA 1.2 g EC tablet TAKE TWO (2) TABLETS BY MOUTH TWICE DAILY.  [name] loratadine (CLARITIN) 10 MG tablet Take 10 mg by mouth daily.   . mesalamine (CANASA) 1000 MG suppository Place 1 suppository (1,000 mg total) rectally at bedtime.  . montelukast (SINGULAIR) 10 MG tablet Take 10 mg by mouth every evening.  . Multiple Vitamin (MULTIVITAMIN ADULT PO) Take 1 tablet by mouth daily.  [name] omeprazole (PRILOSEC) 20 MG capsule Take 40 mg by mouth 2 (two) times daily before a meal. Before breakfast & supper  .  traZODone (DESYREL) 50 MG tablet Take 50 mg by mouth at bedtime.   [name] acetaminophen (TYLENOL) 500 MG tablet Take 1,000 mg by mouth every 6 (six) hours as needed. For pain  . albuterol (PROVENTIL HFA;VENTOLIN HFA) 108 (90 Base) MCG/ACT inhaler Inhale 2 puffs into the lungs every 4 (four) hours as needed for wheezing or shortness of breath.   . Olopatadine HCl (PATADAY) 0.2 % SOLN Place 1 drop into both eyes 3 (three) times daily as needed (allergy eyes.).    Facility-Administered Encounter Medications as of 06/06/2019  Medication  . 0.9 %  sodium chloride infusion  . 0.9 %  sodium chloride infusion    ALLERGIES:  Allergies  Allergen Reactions  . Ace Inhibitors Itching  . Peanut Oil Itching  . Shellfish Allergy Itching and Rash  . Statins Itching  . Adhesive [Tape] Rash  . Pravastatin Sodium Itching     PHYSICAL EXAM:  ECOG Performance status: 1  Vitals:   06/06/19 1414  BP: 133/81  Pulse: 68  Resp: 18  Temp: 99.1 F (37.3 C)  SpO2: 100%   Filed Weights   06/06/19 1414  Weight: 183 lb 3.2 oz (83.1 kg)   Physical Exam Constitutional:      Appearance: Normal appearance. She is normal weight.  Cardiovascular:     Rate and Rhythm: Normal rate and regular rhythm.     Heart sounds: Normal heart sounds.  Pulmonary:     Effort: Pulmonary effort is normal.     Breath sounds: Normal breath sounds.  Abdominal:     General: Bowel sounds are normal.     Palpations: Abdomen is soft.  Musculoskeletal:        General: Normal range of motion.  Skin:    General: Skin is warm.  Neurological:     Mental Status: She is alert and oriented to person, place, and time. Mental status is at baseline.  Psychiatric:        Mood and Affect: Mood normal.        Behavior: Behavior normal.        Thought Content: Thought content normal.  Judgment: Judgment normal.   Breast: Right: Lumpectomy site well-healed scar tissue palpable.  No palpable masses, no skin changes or nipple  discharge, no adenopathy. Left: No palpable masses, no skin changes or nipple discharge, no adenopathy.   LABORATORY DATA:  I have reviewed the labs as listed.  CBC    Component Value Date/Time   WBC 3.8 (L) 06/04/2019 0926   RBC 4.46 06/04/2019 0926   HGB 12.3 06/04/2019 0926   HCT 39.6 06/04/2019 0926   PLT 166 06/04/2019 0926   MCV 88.8 06/04/2019 0926   MCH 27.6 06/04/2019 0926   MCHC 31.1 06/04/2019 0926   RDW 14.1 06/04/2019 0926   LYMPHSABS 1.2 06/04/2019 0926   MONOABS 0.3 06/04/2019 0926   EOSABS 0.0 06/04/2019 0926   BASOSABS 0.0 06/04/2019 0926   CMP Latest Ref Rng & Units 06/04/2019 12/13/2018 11/12/2018  Glucose 70 - 99 mg/dL 207(H) 173(H) 127(H)  BUN 6 - 20 mg/dL 11 9 8   Creatinine 0.44 - 1.00 mg/dL 0.60 0.62 0.50  Sodium 135 - 145 mmol/L 143 142 142  Potassium 3.5 - 5.1 mmol/L 3.9 4.3 4.1  Chloride 98 - 111 mmol/L 103 104 106  CO2 22 - 32 mmol/L 29 30 29   Calcium 8.9 - 10.3 mg/dL 9.1 9.2 9.1  Total Protein 6.5 - 8.1 g/dL 6.9 6.1 6.7  Total Bilirubin 0.3 - 1.2 mg/dL 0.9 0.6 1.0  Alkaline Phos 38 - 126 U/L 111 - 89  AST 15 - 41 U/L 28 22 34  ALT 0 - 44 U/L 32 19 35    All questions were answered to patient's stated satisfaction. Encouraged patient to call with any new concerns or questions before his next visit to the cancer center and we can certain see him sooner, if needed.     ASSESSMENT & PLAN:  Malignant neoplasm of upper-outer quadrant of right female breast ([place]) 1.  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 Anthony Sar on 05/13/2015. -Status post adjuvant chemotherapy with dose dense AC followed by 3 cycles of dose dense docetaxel from 06/25/2015-11/12/2015. -Physical examination today is within normal limits lumpectomy scar in the right breast upper outer quadrant is stable.  No palpable masses. -Last mammogram was dated 05/29/2018 which was BI-RADS Category 2 benign. -Her  mammogram was scheduled for 06/04/2018 however had to be rescheduled and she will have it completed on 06/18/2018. -Labs on 06/04/2018 showed hemoglobin 12.3, potassium 3.9, creatinine 0.60, platelets 166, WBC 3.8. -She will follow-up in 6 months with repeat labs  2.  Iron deficiency state: -This is from chronic blood loss from ulcerative colitis. -Last Feraheme was on 11/27/2017. -She occasionally has episodes of bleeding per rectum she uses suppositories for 7 days which helped. -Labs on 06/04/2019 shows hemoglobin 12.3, ferritin 90, percent saturation 25 -We will give her 2 infusions of IV iron due to her increasing fatigue. -She will follow-up in 6 months with repeat labs  3.  B12 deficiency: -Labs on 06/04/2019 showed vitamin B12 level 736 -She is taking vitamin B12 1 mg daily. -She will follow-up with labs  4.  Ulcerative colitis: -She had an episode of rectal bleeding 1 week prior to June 28, 2018. -She uses canasa suppositories for 7 days and the bleeding subsided. -She is also taking mesalamine 4 tabs daily.  5.Depression: -She is seeing a therapist every 3 months. -She is taking Celexa her dose was just increased in August. -She will  talk to him about switching medications if this does not work for her. -Her PCP also placed her on Abilify at nighttime.     Orders placed this encounter:  Orders Placed This Encounter  Procedures  . Lactate dehydrogenase  . CBC with Differential/Platelet  . Comprehensive metabolic panel  . Ferritin  . Iron and TIBC  . Vitamin B12  . VITAMIN D 25 Hydroxy (Vit-D Deficiency, Fractures)  . Folate      Francene Finders, FNP-C [facility] [phone]

## 2019-06-06 NOTE — Assessment & Plan Note (Signed)
1.  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 Anthony Sar on 05/13/2015. -Status post adjuvant chemotherapy with dose dense AC followed by 3 cycles of dose dense docetaxel from 06/25/2015-11/12/2015. -Physical examination today is within normal limits lumpectomy scar in the right breast upper outer quadrant is stable.  No palpable masses. -Last mammogram was dated 05/29/2018 which was BI-RADS Category 2 benign. -Her mammogram was scheduled for 06/04/2018 however had to be rescheduled and she will have it completed on 06/18/2018. -Labs on 06/04/2018 showed hemoglobin 12.3, potassium 3.9, creatinine 0.60, platelets 166, WBC 3.8. -She will follow-up in 6 months with repeat labs  2.  Iron deficiency state: -This is from chronic blood loss from ulcerative colitis. -Last Feraheme was on 11/27/2017. -She occasionally has episodes of bleeding per rectum she uses suppositories for 7 days which helped. -Labs on 06/04/2019 shows hemoglobin 12.3, ferritin 90, percent saturation 25 -We will give her 2 infusions of IV iron due to her increasing fatigue. -She will follow-up in 6 months with repeat labs  3.  B12 deficiency: -Labs on 06/04/2019 showed vitamin B12 level 736 -She is taking vitamin B12 1 mg daily. -She will follow-up with labs  4.  Ulcerative colitis: -She had an episode of rectal bleeding 1 week prior to June 28, 2018. -She uses canasa suppositories for 7 days and the bleeding subsided. -She is also taking mesalamine 4 tabs daily.  5.Depression: -She is seeing a therapist every 3 months. -She is taking Celexa her dose was just increased in August. -She will talk to him about switching medications if this does not work for her. -Her PCP also placed her on Abilify at nighttime.

## 2019-06-06 NOTE — Patient Instructions (Signed)
[place] at [facility] Discharge Instructions  [place] at [facility] Discharge Instructions  Follow up in 6 months with labs    Thank you for choosing [place] at [facility] to provide your oncology and hematology care.  To afford each patient quality time with our provider, please arrive at least 15 minutes before your scheduled appointment time.   If you have a lab appointment with the [place] please come in thru the Main Entrance and check in at the main information desk.  You need to re-schedule your appointment should you arrive 10 or more minutes late.  We strive to give you quality time with our providers, and arriving late affects you and other patients whose appointments are after yours.  Also, if you no show three or more times for appointments you may be dismissed from the clinic at the providers discretion.     Again, thank you for choosing [facility].  Our hope is that these requests will decrease the amount of time that you wait before being seen by our physicians.       _____________________________________________________________  Should you have questions after your visit to [facility], please contact our office at [phone] between the hours of 8:00 a.m. and 4:30 p.m.  Voicemails left after 4:00 p.m. will not be returned until the following business day.  For prescription refill requests, have your pharmacy contact our office and allow 72 hours.    Due to Covid, you will need to wear a mask upon entering the hospital. If you do not have a mask, a mask will be given to you at the Main Entrance upon arrival. For doctor visits, patients may have 1 support person with them. For treatment visits, patients can not have anyone with them due to social distancing guidelines and our immunocompromised population.        Thank you for choosing [place]  at [facility] to provide your oncology and hematology care.  To afford each patient quality time with our provider, please arrive at least 15 minutes before your scheduled appointment time.   If you have a lab appointment with the [place] please come in thru the Main Entrance and check in at the main information desk.  You need to re-schedule your appointment should you arrive 10 or more minutes late.  We strive to give you quality time with our providers, and arriving late affects you and other patients whose appointments are after yours.  Also, if you no show three or more times for appointments you may be dismissed from the clinic at the providers discretion.     Again, thank you for choosing [facility].  Our hope is that these requests will decrease the amount of time that you wait before being seen by our physicians.       _____________________________________________________________  Should you have questions after your visit to [facility], please contact our office at [phone] between the hours of 8:00 a.m. and 4:30 p.m.  Voicemails left after 4:00 p.m. will not be returned until the following business day.  For prescription refill requests, have your pharmacy contact our office and allow 72 hours.    Due to Covid, you will need to wear a mask upon entering the hospital. If you do not have a mask, a mask will be given to you at the  Main Entrance upon arrival. For doctor visits, patients may have 1 support person with them. For treatment visits, patients can not have anyone with them due to social distancing guidelines and our immunocompromised population.

## 2019-06-13 ENCOUNTER — Other Ambulatory Visit: Payer: Self-pay

## 2019-06-13 ENCOUNTER — Inpatient Hospital Stay (HOSPITAL_COMMUNITY): Payer: Medicaid Other

## 2019-06-13 ENCOUNTER — Encounter (HOSPITAL_COMMUNITY): Payer: Self-pay

## 2019-06-13 VITALS — BP 151/83 | HR 67 | Temp 97.5°F | Resp 18

## 2019-06-13 DIAGNOSIS — D500 Iron deficiency anemia secondary to blood loss (chronic): Secondary | ICD-10-CM

## 2019-06-13 DIAGNOSIS — C50411 Malignant neoplasm of upper-outer quadrant of right female breast: Secondary | ICD-10-CM | POA: Diagnosis not present

## 2019-06-13 MED ORDER — SODIUM CHLORIDE 0.9 % IV SOLN
510.0000 mg | Freq: Once | INTRAVENOUS | Status: AC
  Administered 2019-06-13: 510 mg via INTRAVENOUS
  Filled 2019-06-13: qty 510

## 2019-06-13 MED ORDER — SODIUM CHLORIDE 0.9 % IV SOLN: INTRAVENOUS | Status: DC

## 2019-06-13 NOTE — Patient Instructions (Signed)
[facility] at [facility]  Discharge Instructions:   _______________________________________________________________  Thank you for choosing [facility] at [facility] to provide your oncology and hematology care.  To afford each patient quality time with our providers, please arrive at least 15 minutes before your scheduled appointment.  You need to re-schedule your appointment if you arrive 10 or more minutes late.  We strive to give you quality time with our providers, and arriving late affects you and other patients whose appointments are after yours.  Also, if you no show three or more times for appointments you may be dismissed from the clinic.  Again, thank you for choosing [facility] at [facility]. Our hope is that these requests will allow you access to exceptional care and in a timely manner. _______________________________________________________________  If you have questions after your visit, please contact our office at [phone] between the hours of 8:30 a.m. and 5:00 p.m. Voicemails left after 4:30 p.m. will not be returned until the following business day. _______________________________________________________________  For prescription refill requests, have your pharmacy contact our office. _______________________________________________________________  Recommendations made by the consultant and any test results will be sent to your referring physician. _______________________________________________________________ 

## 2019-06-13 NOTE — Progress Notes (Signed)
Iron infusion given per orders. Patient tolerated it well without problems. Vitals stable and discharged home from clinic ambulatory. Follow up as scheduled.  

## 2019-06-18 ENCOUNTER — Other Ambulatory Visit: Payer: Self-pay

## 2019-06-18 ENCOUNTER — Encounter (HOSPITAL_COMMUNITY): Payer: Medicaid Other

## 2019-06-18 ENCOUNTER — Encounter (HOSPITAL_COMMUNITY): Payer: Self-pay

## 2019-06-18 ENCOUNTER — Other Ambulatory Visit (HOSPITAL_COMMUNITY): Payer: Medicaid Other

## 2019-06-18 ENCOUNTER — Ambulatory Visit (HOSPITAL_COMMUNITY): Admission: RE | Admit: 2019-06-18 | Payer: Medicaid Other | Source: Ambulatory Visit

## 2019-06-18 ENCOUNTER — Ambulatory Visit (HOSPITAL_COMMUNITY)
Admission: RE | Admit: 2019-06-18 | Discharge: 2019-06-18 | Disposition: A | Discharge status: Home / Self Care | Payer: Medicaid Other | Source: Ambulatory Visit | Attending: Nurse Practitioner | Admitting: Nurse Practitioner

## 2019-06-18 DIAGNOSIS — C50411 Malignant neoplasm of upper-outer quadrant of right female breast: Secondary | ICD-10-CM | POA: Diagnosis present

## 2019-06-20 ENCOUNTER — Inpatient Hospital Stay (HOSPITAL_COMMUNITY): Payer: Medicaid Other

## 2019-06-20 ENCOUNTER — Encounter (HOSPITAL_COMMUNITY): Payer: Self-pay

## 2019-06-20 VITALS — BP 133/77 | HR 68 | Temp 97.7°F | Resp 18

## 2019-06-20 DIAGNOSIS — C50411 Malignant neoplasm of upper-outer quadrant of right female breast: Secondary | ICD-10-CM | POA: Diagnosis not present

## 2019-06-20 DIAGNOSIS — D500 Iron deficiency anemia secondary to blood loss (chronic): Secondary | ICD-10-CM

## 2019-06-20 MED ORDER — SODIUM CHLORIDE 0.9 % IV SOLN: INTRAVENOUS | Status: DC

## 2019-06-20 MED ORDER — SODIUM CHLORIDE 0.9 % IV SOLN
510.0000 mg | Freq: Once | INTRAVENOUS | Status: AC
  Administered 2019-06-20: 510 mg via INTRAVENOUS
  Filled 2019-06-20: qty 510

## 2019-06-20 NOTE — Patient Instructions (Signed)
[place] at [facility] Discharge Instructions  Received Feraheme infusion today. Follow-up as scheduled   Thank you for choosing [place] at [facility] to provide your oncology and hematology care.  To afford each patient quality time with our provider, please arrive at least 15 minutes before your scheduled appointment time.   If you have a lab appointment with the [place] please come in thru the Main Entrance and check in at the main information desk.  You need to re-schedule your appointment should you arrive 10 or more minutes late.  We strive to give you quality time with our providers, and arriving late affects you and other patients whose appointments are after yours.  Also, if you no show three or more times for appointments you may be dismissed from the clinic at the providers discretion.     Again, thank you for choosing [facility].  Our hope is that these requests will decrease the amount of time that you wait before being seen by our physicians.       _____________________________________________________________  Should you have questions after your visit to [facility], please contact our office at [phone] between the hours of 8:00 a.m. and 4:30 p.m.  Voicemails left after 4:00 p.m. will not be returned until the following business day.  For prescription refill requests, have your pharmacy contact our office and allow 72 hours.    Due to Covid, you will need to wear a mask upon entering the hospital. If you do not have a mask, a mask will be given to you at the Main Entrance upon arrival. For doctor visits, patients may have 1 support person with them. For treatment visits, patients can not have anyone with them due to social distancing guidelines and our immunocompromised population.

## 2019-06-20 NOTE — Progress Notes (Signed)
Brittney [name] tolerated Feraheme infusion well without complaints or incident. Peripheral IV site checked with positive blood return noted prior to and after infusion. VSS upon discharge. Pt discharged self ambulatory in satisfactory condition

## 2019-07-02 ENCOUNTER — Ambulatory Visit (INDEPENDENT_AMBULATORY_CARE_PROVIDER_SITE_OTHER): Payer: Medicaid Other | Admitting: Internal Medicine

## 2019-07-09 ENCOUNTER — Ambulatory Visit (INDEPENDENT_AMBULATORY_CARE_PROVIDER_SITE_OTHER): Payer: Medicaid Other | Admitting: Gastroenterology

## 2019-07-09 ENCOUNTER — Encounter (INDEPENDENT_AMBULATORY_CARE_PROVIDER_SITE_OTHER): Payer: Self-pay | Admitting: Gastroenterology

## 2019-07-09 ENCOUNTER — Other Ambulatory Visit: Payer: Self-pay

## 2019-07-09 VITALS — BP 132/84 | HR 71 | Temp 99.1°F | Wt 180.0 lb

## 2019-07-09 DIAGNOSIS — K5190 Ulcerative colitis, unspecified, without complications: Secondary | ICD-10-CM | POA: Diagnosis not present

## 2019-07-09 NOTE — Patient Instructions (Signed)
We will check labs and stool studies and call with results.

## 2019-07-09 NOTE — Progress Notes (Signed)
Patient profile: Brittney [name] is a 49 y.o. female seen for f/up. PMHx of breast cancer diagnosed 2017, IDA, B12 deficiency, UC. Last seen 10/2018 in office w/ colonoscopy and endoscopy 12/2018 as below.   History of Present Illness: Brittney [name] is seen today for follow up. She is maintained on lialda 1.2 gram BID, canasa 1044m suppositories, and omeprazole 473mdaily.   She reports "slight flare" of UC symptoms in March or April 2021-symptoms included increased urgency, blood in stool, and mucus in stool. At that point she used canasa for 4-6 week course daily. Symptoms have improved but not resolved. Typically having 2-3 BM/day with small amount of mucus but no rectal bleeding. Very infrequent abdominal pain currently.  Biggest issues seems to be mucus, urgency and tenesmus. She reports these are currently worse than in Dec 2020 when had colonoscopy. She is compliant w/ lialda dosing. She has been off canasa about 1-2 months.  Has occasional nausea with smells/sights of foods. No vomiting. She is on omeprazole 4067mhich controls symptoms well but does have symptoms w/ missed doses. She did not have GES after last OV but denies significant gastric emptying symptoms of early satiety, epigastric fullness, etc. Last Hgb A1c was 8.1.   Hx of IDA - had iron infusions on 6/10 and 6/17     Wt Readings from Last 3 Encounters:  07/09/19 180 lb (81.6 kg)  06/06/19 183 lb 3.2 oz (83.1 kg)  05/31/19 180 lb (81.6 kg)     Last Colonoscopy: 12/2018--The examined portion of the ileum was normal. - The proximal sigmoid colon, mid sigmoid colon, descending colon, splenic flexure, transverse colon, hepatic flexure, ascending colon, cecum, appendiceal orifice and ileocecal valve are normal. - Congested mucosa in the distal sigmoid colon. Biopsied. 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 Biopsied. - Anal papilla(e) were hypertrophied. Normal esophagus. - Z-line  irregular, 40 cm from the incisors. - Portal hypertensive gastropathy. - A few gastric polyps. Biopsied. - Normal duodenal bulb and second portion of the duodenum   C. COLON, BIOPSY:  - Mildly active chronic colitis.  - Negative for granulomata and dysplasia.   D. RECTUM, BIOPSY:  - Moderately active chronic colitis.  - Negative for granulomata and dysplasia.  Last Endoscopy: none prior    Past Medical History:  Past Medical History:  Diagnosis Date   Breast cancer ([place]  Breast cancer of upper-outer quadrant of right female breast ([place]CC[place]/08/2015   Triple negative, 2 cm    Chemotherapy induced neutropenia ([place]/07/2015   Diabetes mellitus ([place]  Type 1 over 15 yrs   Environmental allergies    GERD (gastroesophageal reflux disease)    Iron deficiency anemia due to chronic blood loss 06/12/2015   Personal history of chemotherapy    Personal history of radiation therapy    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) ([place]   Problem List: Patient Active Problem List   Diagnosis Date Noted   Ulcerative colitis without complications (HCCGasquet0/70/35/0093Gastroesophageal reflux disease 10/15/2018   Uncontrolled type 1 diabetes mellitus with hyperglycemia (HCCF[place]6/01/2018   Uncontrolled type 2 diabetes mellitus with hyperglycemia (HCCRed [place]3/18/2020   Neutropenic fever ([place]CC[place]9/08/2015   Chemotherapy induced neutropenia (HCC) 09/10/2015   Nausea without vomiting 07/09/2015   Genetic counseling and testing 06/29/2015   Iron deficiency anemia due to chronic blood loss 06/12/2015   Malignant neoplasm of upper-outer quadrant of right female breast ([place]CC[place]  06/11/2015   Bilateral cataracts 05/12/2015   Type 2 diabetes mellitus without complication ([place]) 68/34/1961   Gastroparesis 08/26/2013   THYROID NODULE, RIGHT 04/17/2008   Ulcerative colitis ([place]) 11/01/2007   Type 1 diabetes mellitus ([place]) 10/11/2007   Mixed hyperlipidemia 10/11/2007   ANEMIA-NOS  10/11/2007   Depression 10/11/2007   GERD 10/11/2007   PEPTIC ULCER DISEASE 10/11/2007   HEADACHE 10/11/2007   COLONIC POLYPS, HX OF 10/11/2007   UTI'S, HX OF 10/11/2007   CHICKENPOX, HX OF 10/11/2007    Past Surgical History: Past Surgical History:  Procedure Laterality Date   BIOPSY  12/05/2018   Procedure: BIOPSY;  Surgeon: Rogene Houston, MD;  [facility];  Service: Endoscopy;;  duodenum gastric colon   BREAST LUMPECTOMY Right 2017   CESAREAN SECTION     COLONOSCOPY N/A 10/09/2013   Procedure: COLONOSCOPY;  Surgeon: Rogene Houston, MD;  [facility];  Service: Endoscopy;  Laterality: N/A;  100   COLONOSCOPY N/A 12/05/2018   Procedure: COLONOSCOPY;  Surgeon: Rogene Houston, MD;  [facility];  Service: Endoscopy;  Laterality: N/A;   DILATION AND CURETTAGE OF UTERUS     x 2 for miscarriages   ESOPHAGOGASTRODUODENOSCOPY     ESOPHAGOGASTRODUODENOSCOPY N/A 12/05/2018   Procedure: ESOPHAGOGASTRODUODENOSCOPY (EGD);  Surgeon: Rogene Houston, MD;  [facility];  Service: Endoscopy;  Laterality: N/A;  200pm   FLEXIBLE SIGMOIDOSCOPY     FLEXIBLE SIGMOIDOSCOPY  07/27/2011   Procedure: FLEXIBLE SIGMOIDOSCOPY;  Surgeon: Rogene Houston, MD;  [facility];  Service: Endoscopy;  Laterality: N/A;  100    Allergies: Allergies  Allergen Reactions   Ace Inhibitors Itching   Peanut Oil Itching   Shellfish Allergy Itching and Rash   Statins Itching   Adhesive [Tape] Rash   Pravastatin Sodium Itching      Home Medications:  Current Outpatient Medications:    acetaminophen (TYLENOL) 500 MG tablet, Take 1,000 mg by mouth every 6 (six) hours as needed. For pain, Disp: , Rfl:    acyclovir (ZOVIRAX) 200 MG capsule, Take 400 mg by mouth daily. Reported on 07/23/2015, Disp: , Rfl:    albuterol (PROVENTIL HFA;VENTOLIN HFA) 108 (90 Base) MCG/ACT inhaler, Inhale 2 puffs into the lungs every 4 (four) hours as needed  for wheezing or shortness of breath. , Disp: , Rfl:    ARIPiprazole (ABILIFY) 2 MG tablet, Take 2 mg by mouth at bedtime., Disp: , Rfl:    atorvastatin (LIPITOR) 20 MG tablet, Take 20 mg by mouth daily. , Disp: , Rfl:    citalopram (CELEXA) 20 MG tablet, Take 30 mg by mouth daily. Pt takes 1 1/2 tablet daily, Disp: , Rfl:    gabapentin (NEURONTIN) 300 MG capsule, Take 300-600 mg by mouth See admin instructions. Take 1 capsule (300 mg) by mouth in the morning & take 2 capsules (600 mg) by mouth at night, Disp: , Rfl: 6   HUMALOG 100 UNIT/ML injection, Inject 8-11 Units into the skin 3 (three) times daily with meals. Sliding Scale Insulin, Disp: , Rfl: 6   LANTUS SOLOSTAR 100 UNIT/ML Solostar Pen, Inject 24 Units into the skin at bedtime., Disp: , Rfl: 6   LIALDA 1.2 g EC tablet, TAKE TWO (2) TABLETS BY MOUTH TWICE DAILY., Disp: 120 tablet, Rfl: 5   loratadine (CLARITIN) 10 MG tablet, Take 10 mg by mouth daily. , Disp: , Rfl: 6   mesalamine (CANASA) 1000 MG suppository, Place 1 suppository (1,000  mg total) rectally at bedtime., Disp: 30 suppository, Rfl: 3   Multiple Vitamin (MULTIVITAMIN ADULT PO), Take 1 tablet by mouth daily., Disp: , Rfl:    omeprazole (PRILOSEC) 20 MG capsule, Take 40 mg by mouth 2 (two) times daily before a meal. Before breakfast & supper, Disp: , Rfl:    traZODone (DESYREL) 50 MG tablet, Take 50 mg by mouth at bedtime. , Disp: , Rfl:    montelukast (SINGULAIR) 10 MG tablet, Take 10 mg by mouth every evening. (Patient not taking: Reported on 07/09/2019), Disp: , Rfl:    Olopatadine HCl (PATADAY) 0.2 % SOLN, Place 1 drop into both eyes 3 (three) times daily as needed (allergy eyes.).  (Patient not taking: Reported on 07/09/2019), Disp: , Rfl:  No current facility-administered medications for this visit.  Facility-Administered Medications Ordered in Other Visits:    0.9 %  sodium chloride infusion, , Intravenous, Continuous, Kefalas, Manon Hilding, PA-C   0.9 %  sodium  chloride infusion, , Intravenous, Continuous, Higgs, Mathis Dad, MD, Stopped at 05/26/17 1510   Family History: family history includes Breast cancer in her cousin and maternal aunt; Colon cancer in her maternal uncle; Diabetes Mellitus II in her father and mother; Hypertension in her father and mother; Non-Hodgkin's lymphoma in her mother.    Social History:   reports that she has never smoked. She has never used smokeless tobacco. She reports that she does not drink alcohol and does not use drugs.   Review of Systems: Constitutional: Denies weight loss/weight gain  Eyes: No changes in vision. ENT: No oral lesions, sore throat.  GI: see HPI.  Heme/Lymph: No easy bruising.  CV: No chest pain.  GU: No hematuria.  Integumentary: No rashes.  Neuro: No headaches.  Psych: No depression/anxiety.  Endocrine: No heat/cold intolerance.  Allergic/Immunologic: No urticaria.  Resp: No cough, SOB.  Musculoskeletal: No joint swelling.    Physical Examination: BP 132/84    Pulse 71    Temp 99.1 F (37.3 C)    Wt 180 lb (81.6 kg)    LMP 09/04/2015 (Approximate)    BMI 34.01 kg/m  Gen: NAD, alert and oriented x 4 HEENT: PEERLA, EOMI, Neck: supple, no JVD Chest: CTA bilaterally, no wheezes, crackles, or other adventitious sounds CV: RRR, no m/g/c/r Abd: soft, NT, ND, +BS in all four quadrants; no HSM, guarding, ridigity, or rebound tenderness Ext: no edema, well perfused with 2+ pulses, Skin: no rash or lesions noted on observed skin Lymph: no noted LAD  Data Reviewed:    06/04/19--folate normal, Vit D 25, Vit B 12 normal. Iron panel normal. Ferritin 90, CMP normal except glucose 207, CBC w/ WBC 3.8, otw normal. Hgb 12.3   EGD 12/2018- Normal esophagus. - Z-line irregular, 40 cm from the incisors. - Portal hypertensive gastropathy. - A few gastric polyps. Biopsied. - Normal duodenal bulb and second portion of the duodenum    Assessment/Plan: Brittney [name] is a 49 y.o. female seen for f/up.  Hx of UC w/ active disease on last colonoscopy 12/2018. Now having mild flare of urgency and tenesmus without any increase in stool frequency or rectal bleeding.  Will check inflammatory markers and fecal calprotectin.  She is currently on 4.8 g of Lialda a day.  Will discuss labs with Dr. Laural Golden when they return regarding long-term management plan.  Brittney [name] was seen today for 1 year follow up.  Diagnoses and all orders for this visit:  Ulcerative colitis without complications, unspecified location ([place]) -  Sed Rate (ESR) -     C-reactive protein -     CALPROTECTIN    I personally performed the service, non-incident to. (WP)  Laurine Blazer, [facility]

## 2019-07-10 LAB — C-REACTIVE PROTEIN: CRP: 2 mg/L (ref <8.0)

## 2019-07-10 LAB — SEDIMENTATION RATE: Sed Rate: 6 mm/h (ref 0–20)

## 2019-07-17 ENCOUNTER — Telehealth (INDEPENDENT_AMBULATORY_CARE_PROVIDER_SITE_OTHER): Payer: Self-pay | Admitting: Gastroenterology

## 2019-07-17 LAB — CALPROTECTIN: Calprotectin: 523 mcg/g — ABNORMAL HIGH

## 2019-07-17 MED ORDER — MESALAMINE 800 MG PO TBEC: 1600.0000 mg | DELAYED_RELEASE_TABLET | Freq: Three times a day (TID) | ORAL | 6 refills | Status: DC

## 2019-07-17 MED ORDER — MESALAMINE 1000 MG RE SUPP
1000.0000 mg | Freq: Every day | RECTAL | 12 refills | Status: DC
Start: 2019-07-17 — End: 2020-01-15

## 2019-07-17 NOTE — Telephone Encounter (Signed)
Case discussed with Dr. Laural Golden.  Recommends Canasa 3 times a week and high-dose asacol for about 2 months, hoping to avoid biologic therapy.  I called patient with recommendations.  Brittney [name]-please schedule follow-up office visit in approximately 2 months we can check how she is doing.

## 2019-08-29 LAB — LIPID PANEL
Cholesterol: 154 mg/dL (ref <200)
HDL: 67 mg/dL (ref >=50)
LDL Cholesterol (Calc): 73 mg/dL (calc)
Non-HDL Cholesterol (Calc): 87 mg/dL (calc) (ref <130)
Total CHOL/HDL Ratio: 2.3 (calc) (ref <5.0)
Triglycerides: 49 mg/dL (ref <150)

## 2019-08-29 LAB — COMPLETE METABOLIC PANEL WITH GFR
AG Ratio: 1.9 (calc) (ref 1.0–2.5)
ALT: 23 U/L (ref 6–29)
AST: 21 U/L (ref 10–35)
Albumin: 4 g/dL (ref 3.6–5.1)
Alkaline phosphatase (APISO): 94 U/L (ref 31–125)
BUN: 10 mg/dL (ref 7–25)
CO2: 32 mmol/L (ref 20–32)
Calcium: 9 mg/dL (ref 8.6–10.2)
Chloride: 104 mmol/L (ref 98–110)
Creat: 0.68 mg/dL (ref 0.50–1.10)
GFR, Est African American: 121 mL/min/{1.73_m2} (ref >=60)
GFR, Est Non African American: 104 mL/min/{1.73_m2} (ref >=60)
Globulin: 2.1 g/dL (calc) (ref 1.9–3.7)
Glucose, Bld: 337 mg/dL — ABNORMAL HIGH (ref 65–99)
Potassium: 4.2 mmol/L (ref 3.5–5.3)
Sodium: 142 mmol/L (ref 135–146)
Total Bilirubin: 0.9 mg/dL (ref 0.2–1.2)
Total Protein: 6.1 g/dL (ref 6.1–8.1)

## 2019-08-29 LAB — T4, FREE: Free T4: 1 ng/dL (ref 0.8–1.8)

## 2019-08-29 LAB — VITAMIN D 25 HYDROXY (VIT D DEFICIENCY, FRACTURES): Vit D, 25-Hydroxy: 18 ng/mL — ABNORMAL LOW (ref 30–100)

## 2019-08-29 LAB — TSH: TSH: 0.6 mIU/L

## 2019-09-03 ENCOUNTER — Telehealth (INDEPENDENT_AMBULATORY_CARE_PROVIDER_SITE_OTHER): Payer: Medicaid Other | Admitting: Nurse Practitioner

## 2019-09-03 ENCOUNTER — Other Ambulatory Visit: Payer: Self-pay

## 2019-09-03 ENCOUNTER — Encounter: Payer: Self-pay | Admitting: Nurse Practitioner

## 2019-09-03 VITALS — Wt 180.0 lb

## 2019-09-03 DIAGNOSIS — E782 Mixed hyperlipidemia: Secondary | ICD-10-CM | POA: Diagnosis not present

## 2019-09-03 DIAGNOSIS — E1065 Type 1 diabetes mellitus with hyperglycemia: Secondary | ICD-10-CM

## 2019-09-03 DIAGNOSIS — E559 Vitamin D deficiency, unspecified: Secondary | ICD-10-CM | POA: Diagnosis not present

## 2019-09-03 NOTE — Progress Notes (Signed)
09/03/2019, 10:21 AM     Endocrinology follow-up note   TELEHEALTH VISIT: The patient is being engaged in telehealth visit due to COVID-19.  This type of visit limits physical examination significantly, and thus is not preferable over face-to-face encounters.  I connected with  Brittney [name] on 09/03/19 by a video enabled telemedicine application and verified that I am speaking with the correct person using two identifiers.   I discussed the limitations of evaluation and management by telemedicine. The patient expressed understanding and agreed to proceed.    The participants involved in this visit include: Brita Romp, NP located at [facility] and Brittney [name]  located at their personal residence listed.  Subjective:    Patient ID: Brittney [name], female    DOB: [birth year].  Brittney [name] is being seen in follow-up  for management of currently uncontrolled symptomatic diabetes requested by  [name], MD.   Past Medical History:  Diagnosis Date  . Breast cancer ([place])   . Breast cancer of upper-outer quadrant of right female breast ([place]) 06/11/2015   Triple negative, 2 cm   . Chemotherapy induced neutropenia ([place]) 09/10/2015  . Diabetes mellitus ([place])    Type 1 over 15 yrs  . Environmental allergies   . GERD (gastroesophageal reflux disease)   . Iron deficiency anemia due to chronic blood loss 06/12/2015  . Personal history of chemotherapy   . Personal history of radiation therapy   . 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 BIOPSY  12/05/2018   Procedure: BIOPSY;  Surgeon: Rogene Houston, MD;  Location: [facility];  Service: Endoscopy;;  duodenum gastric colon  . BREAST LUMPECTOMY Right 2017  . CESAREAN SECTION    . COLONOSCOPY N/A 10/09/2013   Procedure:  COLONOSCOPY;  Surgeon: Rogene Houston, MD;  Location: [facility];  Service: Endoscopy;  Laterality: N/A;  100  . COLONOSCOPY N/A 12/05/2018   Procedure: COLONOSCOPY;  Surgeon: Rogene Houston, MD;  [facility]: [facility];  Service: Endoscopy;  Laterality: N/A;  . DILATION AND CURETTAGE OF UTERUS     x 2 for miscarriages  . ESOPHAGOGASTRODUODENOSCOPY    . ESOPHAGOGASTRODUODENOSCOPY N/A 12/05/2018   Procedure: ESOPHAGOGASTRODUODENOSCOPY (EGD);  Surgeon: Rogene Houston, MD;  [facility]: [facility];  Service: Endoscopy;  Laterality: N/A;  200pm  . FLEXIBLE SIGMOIDOSCOPY    . FLEXIBLE SIGMOIDOSCOPY  07/27/2011   Procedure: FLEXIBLE SIGMOIDOSCOPY;  Surgeon: Rogene Houston, MD;  [facility]: [facility];  Service: Endoscopy;  Laterality: N/A;  100    Social History   Socioeconomic History  . Marital status: Single    Spouse name: Not on file  . Number of children: Not on file  . Years of education: Not on file  . Highest education level: Not on file  Occupational History  . Not on file  Tobacco Use  . Smoking status: Never Smoker  . Smokeless tobacco: Never Used  Vaping Use  . Vaping Use: Never used  Substance and Sexual  Activity  . Alcohol use: No    Alcohol/week: 0.0 standard drinks  . Drug use: No  . Sexual activity: Not on file  Other Topics Concern  . Not on file  Social History Narrative  . Not on file   Social Determinants of Health   Financial Resource Strain:   . Difficulty of Paying Living Expenses: Not on file  Food Insecurity:   . Worried About Charity fundraiser in the Last Year: Not on file  . Ran Out of Food in the Last Year: Not on file  Transportation Needs:   . Lack of Transportation (Medical): Not on file  . Lack of Transportation (Non-Medical): Not on file  Physical Activity:   . Days of Exercise per Week: Not on file  . Minutes of Exercise per Session: Not on file  Stress:   . Feeling of Stress : Not on file  Social Connections:   . Frequency  of Communication with Friends and Family: Not on file  . Frequency of Social Gatherings with Friends and Family: Not on file  . Attends Religious Services: Not on file  . Active Member of Clubs or Organizations: Not on file  . Attends Archivist Meetings: Not on file  . Marital Status: Not on file    Family History  Problem Relation Age of Onset  . Non-Hodgkin's lymphoma Mother   . Hypertension Mother   . Diabetes Mellitus II Mother   . Hypertension Father   . Diabetes Mellitus II Father   . Breast cancer Cousin   . Breast cancer Maternal Aunt   . Colon cancer Maternal Uncle     Outpatient Encounter Medications as of 09/03/2019  Medication Sig  . acetaminophen (TYLENOL) 500 MG tablet Take 1,000 mg by mouth every 6 (six) hours as needed. For pain  . acyclovir (ZOVIRAX) 200 MG capsule Take 400 mg by mouth daily. Reported on 07/23/2015  . albuterol (PROVENTIL HFA;VENTOLIN HFA) 108 (90 Base) MCG/ACT inhaler Inhale 2 puffs into the lungs every 4 (four) hours as needed for wheezing or shortness of breath.   . ARIPiprazole (ABILIFY) 20 MG tablet Take 20 mg by mouth at bedtime.  [name] atorvastatin (LIPITOR) 20 MG tablet Take 20 mg by mouth daily.   . citalopram (CELEXA) 20 MG tablet Take 30 mg by mouth daily. Pt takes 1 1/2 tablet daily  . Continuous Blood Gluc Sensor (DEXCOM G6 SENSOR) MISC SMARTSIG:1 Each Topical Every 10 Days  . Continuous Blood Gluc Transmit (DEXCOM G6 TRANSMITTER) MISC   . diclofenac Sodium (VOLTAREN) 1 % GEL Apply 1 application topically 3 (three) times daily.  [name] gabapentin (NEURONTIN) 300 MG capsule Take 300-600 mg by mouth See admin instructions. Take 1 capsule (300 mg) by mouth in the morning & take 2 capsules (600 mg) by mouth at night  . HUMALOG 100 UNIT/ML injection Inject 8-11 Units into the skin 3 (three) times daily with meals. Sliding Scale Insulin  . LANTUS SOLOSTAR 100 UNIT/ML Solostar Pen Inject 24 Units into the skin at bedtime.  [name] loratadine  (CLARITIN) 10 MG tablet Take 10 mg by mouth daily.   [name] losartan (COZAAR) 25 MG tablet Take 25 mg by mouth daily.  . Mesalamine (ASACOL HD) 800 MG TBEC Take 2 tablets (1,600 mg total) by mouth in the morning, at noon, and at bedtime.  . mesalamine (CANASA) 1000 MG suppository Place 1 suppository (1,000 mg total) rectally at bedtime. Take 3x/week.  . Multiple Vitamin (MULTIVITAMIN ADULT PO) Take  1 tablet by mouth daily.  [name] omeprazole (PRILOSEC) 20 MG capsule Take 40 mg by mouth 2 (two) times daily before a meal. Before breakfast & supper  . traZODone (DESYREL) 50 MG tablet Take 50 mg by mouth at bedtime.   . [DISCONTINUED] ARIPiprazole (ABILIFY) 2 MG tablet Take 2 mg by mouth at bedtime.  . [DISCONTINUED] mesalamine (CANASA) 1000 MG suppository Place 1 suppository (1,000 mg total) rectally at bedtime.  [name] ACCU-CHEK AVIVA PLUS test strip SMARTSIG:Via Meter 6 Times Daily  . [DISCONTINUED] montelukast (SINGULAIR) 10 MG tablet Take 10 mg by mouth every evening. (Patient not taking: Reported on 07/09/2019)  . [DISCONTINUED] Olopatadine HCl (PATADAY) 0.2 % SOLN Place 1 drop into both eyes 3 (three) times daily as needed (allergy eyes.).  (Patient not taking: Reported on 07/09/2019)   Facility-Administered Encounter Medications as of 09/03/2019  Medication  . 0.9 %  sodium chloride infusion  . 0.9 %  sodium chloride infusion    ALLERGIES: Allergies  Allergen Reactions  . Ace Inhibitors Itching  . Peanut Oil Itching  . Shellfish Allergy Itching and Rash  . Statins Itching  . Adhesive [Tape] Rash  . Pravastatin Sodium Itching    VACCINATION STATUS: Immunization History  Administered Date(s) Administered  . Influenza,inj,Quad PF,6+ Mos 10/22/2015  . Pneumococcal Polysaccharide-23 01/03/2006  . Td 10/04/2006    Diabetes She presents for her follow-up diabetic visit. She has type 1 diabetes mellitus. Onset time: She was diagnosed at approximate age of 52 years. Her disease course has been stable.  Hypoglycemia symptoms include sweats and tremors. Pertinent negatives for hypoglycemia include no confusion, nervousness/anxiousness, pallor or seizures. Associated symptoms include fatigue, polydipsia and polyuria. Pertinent negatives for diabetes include no chest pain, no polyphagia and no weight loss. There are no hypoglycemic complications. Symptoms are stable. There are no diabetic complications. Risk factors for coronary artery disease include diabetes mellitus, dyslipidemia, obesity and sedentary lifestyle. Current diabetic treatment includes insulin injections. She is compliant with treatment most of the time. Her weight is fluctuating minimally. She is following a generally unhealthy diet. When asked about meal planning, she reported none. She has not had a previous visit with a dietitian. Her home blood glucose trend is fluctuating minimally. Her breakfast blood glucose range is generally >200 mg/dl. Her lunch blood glucose range is generally 140-180 mg/dl. Her bedtime blood glucose range is generally >200 mg/dl. (She reports for her virtual appointment with her logs and CGM showing significantly over target fasting glycemic profile and near target postprandial glycemic profile.  Her previous A1C was 8.1% in May 2021, not done with previsit labs this time.  Her last week of readings are as follows:  8/23:  189; 89; 70 8/24:  214; 228; 128 8/25:  216; 141; 132 8/26:  337; 163; 227 8/27:  247; 101; 337 8/28:  234; 54; 234 8/29:  111; 59; 391 8/30:  214; 154; 132 8/31:  202  She attributes her low blood glucose readings to inadequate meal intake.  These hypoglycemic episodes are rare.) An ACE inhibitor/angiotensin II receptor blocker is not being taken. She does not see a podiatrist.Eye exam is current.  Hyperlipidemia This is a chronic problem. The current episode started more than 1 year ago. The problem is controlled. Recent lipid tests were reviewed and are normal. Exacerbating diseases  include diabetes. There are no known factors aggravating her hyperlipidemia. Pertinent negatives include no chest pain, myalgias or shortness of breath. Current antihyperlipidemic treatment includes statins. The current treatment provides  significant improvement of lipids. Compliance problems include adherence to diet.  Risk factors for coronary artery disease include diabetes mellitus, dyslipidemia, obesity and family history.    Review of systems  Constitutional: + Minimally fluctuating body weight,  current  Body mass index is 34.01 kg/m. , no fatigue, no subjective hyperthermia, no subjective hypothermia Eyes: no blurry vision, no xerophthalmia ENT: no sore throat, no nodules palpated in throat, no dysphagia/odynophagia, no hoarseness Cardiovascular: no Chest Pain, no Shortness of Breath, no palpitations, no leg swelling Respiratory: no cough, no shortness of breath Gastrointestinal: no Nausea/Vomiting/Diarhhea Musculoskeletal: no muscle/joint aches Skin: no rashes, no hyperemia Neurological: no tremors, no numbness, no tingling, no dizziness Psychiatric: no depression, no anxiety   Objective:    Wt 180 lb (81.6 kg) Comment: per patient  LMP 09/04/2015 (Approximate)   BMI 34.01 kg/m   Wt Readings from Last 3 Encounters:  09/03/19 180 lb (81.6 kg)  07/09/19 180 lb (81.6 kg)  06/06/19 183 lb 3.2 oz (83.1 kg)    BP Readings from Last 3 Encounters:  07/09/19 132/84  06/20/19 133/77  06/13/19 (!) 151/83    Physical Exam- Telehealth- significantly limited due to nature of visit  Constitutional: Body mass index is 34.01 kg/m. , not in acute distress, normal state of mind Respiratory: Adequate breathing efforts  Recent Results (from the past 2160 hour(s))  Sed Rate (ESR)     Status: None   Collection Time: 07/09/19  2:57 PM  Result Value Ref Range   Sed Rate 6 0 - 20 mm/h  C-reactive protein     Status: None   Collection Time: 07/09/19  2:57 PM  Result Value Ref Range    CRP 2.0 <8.0 mg/L  CALPROTECTIN     Status: Abnormal   Collection Time: 07/10/19  1:07 PM  Result Value Ref Range   Calprotectin 523 (H) mcg/g    Comment:                                       Reference Range:                                       <50     Normal                                       50-120  Borderline                                       >120    Elevated . Calprotectin in Crohn's disease and ulcerative colitis can be five to several thousand times above the reference population (50 mcg/g or less). Levels are usually 50 mcg/g or less in healthy patients and with irritable bowel syndrome. Repeat testing in 4-6 weeks is suggested for borderline values.   COMPLETE METABOLIC PANEL WITH GFR     Status: Abnormal   Collection Time: 08/29/19  9:42 AM  Result Value Ref Range   Glucose, Bld 337 (H) 65 - 99 mg/dL    Comment: .            Fasting reference interval . For someone without  known diabetes, a glucose value >125 mg/dL indicates that they may have diabetes and this should be confirmed with a follow-up test. .    BUN 10 7 - 25 mg/dL   Creat 0.68 0.50 - 1.10 mg/dL   GFR, Est Non African American 104 > OR = 60 mL/min/1.57m   GFR, Est African American 121 > OR = 60 mL/min/1.726m  BUN/Creatinine Ratio NOT APPLICABLE 6 - 22 (calc)   Sodium 142 135 - 146 mmol/L   Potassium 4.2 3.5 - 5.3 mmol/L   Chloride 104 98 - 110 mmol/L   CO2 32 20 - 32 mmol/L   Calcium 9.0 8.6 - 10.2 mg/dL   Total Protein 6.1 6.1 - 8.1 g/dL   Albumin 4.0 3.6 - 5.1 g/dL   Globulin 2.1 1.9 - 3.7 g/dL (calc)   AG Ratio 1.9 1.0 - 2.5 (calc)   Total Bilirubin 0.9 0.2 - 1.2 mg/dL   Alkaline phosphatase (APISO) 94 31 - 125 U/L   AST 21 10 - 35 U/L   ALT 23 6 - 29 U/L  TSH     Status: None   Collection Time: 08/29/19  9:42 AM  Result Value Ref Range   TSH 0.60 mIU/L    Comment:           Reference Range .           > or = 20 Years  0.40-4.50 .                Pregnancy Ranges            First trimester    0.26-2.66           Second trimester   0.55-2.73           Third trimester    0.43-2.91   T4, free     Status: None   Collection Time: 08/29/19  9:42 AM  Result Value Ref Range   Free T4 1.0 0.8 - 1.8 ng/dL  Lipid panel     Status: None   Collection Time: 08/29/19  9:42 AM  Result Value Ref Range   Cholesterol 154 <200 mg/dL   HDL 67 > OR = 50 mg/dL   Triglycerides 49 <150 mg/dL   LDL Cholesterol (Calc) 73 mg/dL (calc)    Comment: Reference range: <100 . Desirable range <100 mg/dL for primary prevention;   <70 mg/dL for patients with CHD or diabetic patients  with > or = 2 CHD risk factors. . Marland KitchenDL-C is now calculated using the Martin-Hopkins  calculation, which is a validated novel method providing  better accuracy than the Friedewald equation in the  estimation of LDL-C.  MaCresenciano Genret al. JAAnnamaria [id number];413([id number]  (http://education.[facility].com/faq/FAQ164)    Total CHOL/HDL Ratio 2.3 <5.0 (calc)   Non-HDL Cholesterol (Calc) 87 <130 mg/dL (calc)    Comment: For patients with diabetes plus 1 major ASCVD risk  factor, treating to a non-HDL-C goal of <100 mg/dL  (LDL-C of <70 mg/dL) is considered a therapeutic  option.   VITAMIN D 25 Hydroxy (Vit-D Deficiency, Fractures)     Status: Abnormal   Collection Time: 08/29/19  9:42 AM  Result Value Ref Range   Vit D, 25-Hydroxy 18 (L) 30 - 100 ng/mL    Comment: Vitamin D Status         25-OH Vitamin D: . Deficiency:                    <20  ng/mL Insufficiency:             20 - 29 ng/mL Optimal:                 > or = 30 ng/mL . For 25-OH Vitamin D testing on patients on  D2-supplementation and patients for whom quantitation  of D2 and D3 fractions is required, the [facility]ureD(TM) 25-OH VIT D, (D2,D3), LC/MS/MS is recommended: order  code [id number] (patients >49yr). See Note 1 . Note 1 . For additional information, please refer to  http://education.[facility].com/faq/FAQ199  (This link is  being provided for informational/ educational purposes only.)      Diabetic Labs (most recent): Lab Results  Component Value Date   HGBA1C 8.1 (A) 05/31/2019   HGBA1C 8.4 (H) 12/13/2018   HGBA1C 7.7 (H) 09/14/2018     Lipid Panel ( most recent) Lipid Panel     Component Value Date/Time   CHOL 154 08/29/2019 0942   TRIG 49 08/29/2019 0942   HDL 67 08/29/2019 0942   CHOLHDL 2.3 08/29/2019 0942   LDLCALC 73 08/29/2019 0942   CMP Latest Ref Rng & Units 08/29/2019 06/04/2019 12/13/2018  Glucose 65 - 99 mg/dL 337(H) 207(H) 173(H)  BUN 7 - 25 mg/dL 10 11 9   Creatinine 0.50 - 1.10 mg/dL 0.68 0.60 0.62  Sodium 135 - 146 mmol/L 142 143 142  Potassium 3.5 - 5.3 mmol/L 4.2 3.9 4.3  Chloride 98 - 110 mmol/L 104 103 104  CO2 20 - 32 mmol/L 32 29 30  Calcium 8.6 - 10.2 mg/dL 9.0 9.1 9.2  Total Protein 6.1 - 8.1 g/dL 6.1 6.9 6.1  Total Bilirubin 0.2 - 1.2 mg/dL 0.9 0.9 0.6  Alkaline Phos 38 - 126 U/L - 111 -  AST 10 - 35 U/L 21 28 22   ALT 6 - 29 U/L 23 32 19     Assessment & Plan:   1. Uncontrolled type 1 diabetes mellitus with hyperglycemia (HCC)  - Terrill S MMartensonhas currently uncontrolled symptomatic type 2 DM since 49years of age.  She reports for her virtual appointment with her logs and CGM showing significantly over target fasting glycemic profile and near target postprandial glycemic profile.  Her previous A1C was 8.1% in May 2021, not done with previsit labs this time.  Her last week of readings are as follows:  8/23:  189; 89; 70 8/24:  214; 228; 128 8/25:  216; 141; 132 8/26:  337; 163; 227 8/27:  247; 101; 337 8/28:  234; 54; 234 8/29:  111; 59; 391 8/30:  214; 154; 132 8/31:  202  She attributes her low blood glucose readings to inadequate meal intake.  These hypoglycemic episodes are rare.  Her anti-GAD antibody elevated at 61 suggesting type 1 diabetes.  -her diabetes is complicated by obesity/sedentary life and she remains at a high risk for more acute  and chronic complications which include CAD, CVA, CKD, retinopathy, and neuropathy. These are all discussed in detail with her.  - The patient admits there is a room for improvement in their diet and drink choices. -  Suggestion is made for the patient to avoid simple carbohydrates from their diet including Cakes, Sweet Desserts / Pastries, Ice Cream, Soda (diet and regular), Sweet Tea, Candies, Chips, Cookies, Sweet Pastries,  Store Bought Juices, Alcohol in Excess of  1-2 drinks a day, Artificial Sweeteners, Coffee Creamer, and "Sugar-free" Products. This will help patient to have stable blood glucose profile and potentially avoid unintended weight  gain.   - I encouraged the patient to switch to  unprocessed or minimally processed complex starch and increased protein intake (animal or plant source), fruits, and vegetables.   - Patient is advised to stick to a routine mealtimes to eat 3 meals  a day and avoid unnecessary snacks ( to snack only to correct hypoglycemia).  - I have approached her with the following individualized plan to manage diabetes and patient agrees:   -She we will continue to need intensive treatment with basal/bolus insulin in order for her to achieve and maintain control of diabetes to target.    -Due to fasting hyperglycemia, she is advised to increase her Lantus slightly to 28 units SQ daily at bedtime.  She is advised to continue Humalog 8-11 units TID with meals if blood glucose above 90 and she is eating.  Specific instructions on how to titrate insulin dose based on blood glucose readings given to patient in writing.  -She is advised to continue monitoring blood glucose at least 4 times per day using her CGM, before meals and at bedtime, and to notify the clinic if blood glucose readings are less than 70 or greater than 200 for 3 tests in a row.  - she is warned not to take insulin without proper monitoring per orders.  -She is encouraged to continue to use her Dexcom  at all times.  She is not a suitable insulin pump candidate at this time.   - she is not a candidate for metformin, SGLT2 inhibitors, nor incretin therapy.   - Patient specific target  A1c;  LDL, HDL, Triglycerides, and  Waist Circumference were discussed in detail.  2) Blood Pressure /Hypertension:  Her blood pressure is controlled to target based on previous visits.  She is not currently on any antihypertensive medications at this time.  3) Lipids/Hyperlipidemia:  Her most recent lipid profile from 8/26/21shows controlled LDL of 73 and triglycerides of 117.  She is advised to continue Atorvastatin 20 mg po daily at bedtime.  4)  Weight/Diet:  Her Body mass index is 34.01 kg/m.-clearly complicating her diabetes care.  A candidate for modest weight loss, may avoid further weight gain by avoiding unnecessary snacks.  I discussed with her the fact that loss of 5 - 10% of her  current body weight will have the most impact on her diabetes management.  CDE Consult will be initiated. Exercise, and detailed carbohydrates information provided  -  detailed on discharge instructions.  5) Vitamin D deficiency: Her recent vitamin D level was 18.  She is not currently taking any vitamin D supplementation, but has taken it in the past.  She is advised to start taking OTC Vitamin D3 5000 units daily for the next 3 months.  Will recheck labs on subsequent visits.  6) Chronic Care/Health Maintenance: -she is not on ACEI/ARB and Statin medications and is encouraged to initiate/continue follow up with Ophthalmology, Dentist, and Podiatrist at least yearly or according to recommendations, and advised to   stay away from smoking. I have recommended yearly flu vaccine and pneumonia vaccine at least every 5 years; moderate intensity exercise for up to 150 minutes weekly; and  sleep for at least 7 hours a day.  - she is  advised to maintain close follow up with [name], MD for primary care needs, as well as  her other providers for optimal and coordinated care.  I spent 30 minutes dedicated to the care of this patient on the date  of this encounter to include pre-visit review of records, face-to-face time with the patient, and post visit ordering of  testing.   Please refer to Patient Instructions for Blood Glucose Monitoring and Insulin/Medications Dosing Guide"  in media tab for additional information. Please  also refer to " Patient Self Inventory" in the Media  tab for reviewed elements of pertinent patient history.  Brittney [name] participated in the discussions, expressed understanding, and voiced agreement with the above plans.  All questions were answered to her satisfaction. she is encouraged to contact clinic should she have any questions or concerns prior to her return visit.   Follow up plan: - No follow-ups on file.  Rayetta Pigg, FNP-BC [facility] Phone: [phone] Fax: [phone]    09/03/2019, 10:21 AM  This note was partially dictated with voice recognition software. Similar sounding words can be transcribed inadequately or may not  be corrected upon review.

## 2019-09-03 NOTE — Patient Instructions (Signed)

## 2019-09-19 ENCOUNTER — Other Ambulatory Visit: Payer: Self-pay

## 2019-09-19 ENCOUNTER — Encounter (INDEPENDENT_AMBULATORY_CARE_PROVIDER_SITE_OTHER): Payer: Self-pay | Admitting: Gastroenterology

## 2019-09-19 ENCOUNTER — Ambulatory Visit (INDEPENDENT_AMBULATORY_CARE_PROVIDER_SITE_OTHER): Payer: Medicaid Other | Admitting: Gastroenterology

## 2019-09-19 VITALS — BP 125/83 | HR 78 | Temp 98.9°F | Ht 61.0 in | Wt 181.3 lb

## 2019-09-19 DIAGNOSIS — K5190 Ulcerative colitis, unspecified, without complications: Secondary | ICD-10-CM

## 2019-09-19 NOTE — Patient Instructions (Addendum)
[facility] Colon health or Align are two good probiotics - recommend for 3-4 weeks after amoxicillin. If diarrhea continues please notify me. We are checking stool inflammatory marker.

## 2019-09-19 NOTE — Progress Notes (Signed)
Patient profile: Brittney [name] is a 49 y.o. female seen for follow up. PMHx of breast cancer diagnosed 2017, IDA, B12 deficiency, UC. She was last seen in clinic July 2021.  At that time we checked labs and recommended to use Canasa 3 times a week and transitioned to Asacol.    PMHx of distal UC - last colonoscopy Dec 2020.    History of Present Illness: Brittney [name] is seen today for follow up. She reports symptoms are improved compared to her last visit in July.  She is typically having 1-2 soft stools.  She does note being on a recent course of amoxicillin for a sinus infection but this has made her stools.  Months.  She just completed with amoxicillin this week.  She denies any blood in stool.  Does have some slight issues with mucus in stools.  Occasionally will miss a day without a bowel movement feel bloated.  She is now taking high-dose mesalamine as well as using Canasa 3 times a week.  She denies any frequent GERD symptoms.  No nausea, vomiting, dysphagia.  Appetite good.   No NSAIDs. No alcohol. No tobacco.    Wt Readings from Last 3 Encounters:  09/19/19 181 lb 4.8 oz (82.2 kg)  09/03/19 180 lb (81.6 kg)  07/09/19 180 lb (81.6 kg)     Last Colonoscopy:  Last Endoscopy:    Past Medical History:  Past Medical History:  Diagnosis Date  . Breast cancer ([place])   . Breast cancer of upper-outer quadrant of right female breast ([place]) 06/11/2015   Triple negative, 2 cm   . Chemotherapy induced neutropenia ([place]) 09/10/2015  . Diabetes mellitus ([place])    Type 1 over 15 yrs  . Environmental allergies   . GERD (gastroesophageal reflux disease)   . Iron deficiency anemia due to chronic blood loss 06/12/2015  . Personal history of chemotherapy   . Personal history of radiation therapy   . 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) [facility])     Problem List: Patient Active Problem List   Diagnosis Date Noted  . Ulcerative colitis without complications ([place]) 95/09/3265  .  Gastroesophageal reflux disease 10/15/2018  . Uncontrolled type 1 diabetes mellitus with hyperglycemia ([place]) 06/04/2018  . Uncontrolled type 2 diabetes mellitus with hyperglycemia ([place]) 03/21/2018  . Neutropenic fever ([place]) 09/11/2015  . Chemotherapy induced neutropenia ([place]) 09/10/2015  . Nausea without vomiting 07/09/2015  . Genetic counseling and testing 06/29/2015  . Iron deficiency anemia due to chronic blood loss 06/12/2015  . Malignant neoplasm of upper-outer quadrant of right female breast ([place]) 06/11/2015  . Bilateral cataracts 05/12/2015  . Type 2 diabetes mellitus without complication ([place]) 12/45/8099  . Gastroparesis 08/26/2013  . THYROID NODULE, RIGHT 04/17/2008  . Ulcerative colitis ([place]) 11/01/2007  . Type 1 diabetes mellitus ([place]) 10/11/2007  . Mixed hyperlipidemia 10/11/2007  . ANEMIA-NOS 10/11/2007  . Depression 10/11/2007  . GERD 10/11/2007  . PEPTIC ULCER DISEASE 10/11/2007  . HEADACHE 10/11/2007  . COLONIC POLYPS, HX OF 10/11/2007  . UTI'S, HX OF 10/11/2007  . CHICKENPOX, HX OF 10/11/2007    Past Surgical History: Past Surgical History:  Procedure Laterality Date  . BIOPSY  12/05/2018   Procedure: BIOPSY;  Surgeon: Rogene Houston, MD;  [facility];  Service: Endoscopy;;  duodenum gastric colon  . BREAST LUMPECTOMY Right 2017  . CESAREAN SECTION    . COLONOSCOPY N/A 10/09/2013   Procedure: COLONOSCOPY;  Surgeon: Rogene Houston, MD;  [facility]:  [facility];  Service: Endoscopy;  Laterality: N/A;  100  . COLONOSCOPY N/A 12/05/2018   Procedure: COLONOSCOPY;  Surgeon: Rogene Houston, MD;  [facility]: [facility];  Service: Endoscopy;  Laterality: N/A;  . DILATION AND CURETTAGE OF UTERUS     x 2 for miscarriages  . ESOPHAGOGASTRODUODENOSCOPY    . ESOPHAGOGASTRODUODENOSCOPY N/A 12/05/2018   Procedure: ESOPHAGOGASTRODUODENOSCOPY (EGD);  Surgeon: Rogene Houston, MD;  [facility];  Service: Endoscopy;  Laterality: N/A;  200pm  .  FLEXIBLE SIGMOIDOSCOPY    . FLEXIBLE SIGMOIDOSCOPY  07/27/2011   Procedure: FLEXIBLE SIGMOIDOSCOPY;  Surgeon: Rogene Houston, MD;  [facility]: [facility];  Service: Endoscopy;  Laterality: N/A;  100    Allergies: Allergies  Allergen Reactions  . Ace Inhibitors Itching  . Peanut Oil Itching  . Shellfish Allergy Itching and Rash  . Statins Itching  . Adhesive [Tape] Rash  . Pravastatin Sodium Itching      Home Medications:  Current Outpatient Medications:  .  ACCU-CHEK AVIVA PLUS test strip, SMARTSIG:Via Meter 6 Times Daily, Disp: , Rfl:  .  acetaminophen (TYLENOL) 500 MG tablet, Take 1,000 mg by mouth every 6 (six) hours as needed. For pain, Disp: , Rfl:  .  acyclovir (ZOVIRAX) 200 MG capsule, Take 400 mg by mouth daily. Reported on 07/23/2015, Disp: , Rfl:  .  albuterol (PROVENTIL HFA;VENTOLIN HFA) 108 (90 Base) MCG/ACT inhaler, Inhale 2 puffs into the lungs every 4 (four) hours as needed for wheezing or shortness of breath. , Disp: , Rfl:  .  ARIPiprazole (ABILIFY) 10 MG tablet, Take 10 mg by mouth at bedtime. , Disp: , Rfl:  .  atorvastatin (LIPITOR) 20 MG tablet, Take 20 mg by mouth daily. , Disp: , Rfl:  .  Continuous Blood Gluc Sensor (DEXCOM G6 SENSOR) MISC, SMARTSIG:1 Each Topical Every 10 Days, Disp: , Rfl:  .  Continuous Blood Gluc Transmit (DEXCOM G6 TRANSMITTER) MISC, , Disp: , Rfl:  .  diclofenac Sodium (VOLTAREN) 1 % GEL, Apply 1 application topically 3 (three) times daily., Disp: , Rfl:  .  DULAGLUTIDE [place], Inject into the skin., Disp: , Rfl:  .  gabapentin (NEURONTIN) 300 MG capsule, Take 300-600 mg by mouth See admin instructions. Take 1 capsule (300 mg) by mouth in the morning & take 2 capsules (600 mg) by mouth at night, Disp: , Rfl: 6 .  HUMALOG 100 UNIT/ML injection, Inject 8-11 Units into the skin 3 (three) times daily with meals. Sliding Scale Insulin, Disp: , Rfl: 6 .  LANTUS SOLOSTAR 100 UNIT/ML Solostar Pen, Inject 28 Units into the skin at bedtime., Disp: ,  Rfl: 6 .  loratadine (CLARITIN) 10 MG tablet, Take 10 mg by mouth daily. , Disp: , Rfl: 6 .  losartan (COZAAR) 25 MG tablet, Take 25 mg by mouth daily., Disp: , Rfl:  .  Mesalamine (ASACOL HD) 800 MG TBEC, Take 2 tablets (1,600 mg total) by mouth in the morning, at noon, and at bedtime., Disp: 180 tablet, Rfl: 6 .  mesalamine (CANASA) 1000 MG suppository, Place 1 suppository (1,000 mg total) rectally at bedtime. Take 3x/week., Disp: 30 suppository, Rfl: 12 .  Multiple Vitamin (MULTIVITAMIN ADULT PO), Take 1 tablet by mouth daily., Disp: , Rfl:  .  omeprazole (PRILOSEC) 20 MG capsule, Take 40 mg by mouth 2 (two) times daily before a meal. Before breakfast & supper, Disp: , Rfl:  .  traZODone (DESYREL) 50 MG tablet, Take 50 mg by mouth  at bedtime. , Disp: , Rfl:  No current facility-administered medications for this visit.  Facility-Administered Medications Ordered in Other Visits:  .  0.9 %  sodium chloride infusion, , Intravenous, Continuous, Kefalas, Manon Hilding, PA-C .  0.9 %  sodium chloride infusion, , Intravenous, Continuous, Higgs, Mathis Dad, MD, Stopped at 05/26/17 1510   Family History: family history includes Breast cancer in her cousin and maternal aunt; Colon cancer in her maternal uncle; Diabetes Mellitus II in her father and mother; Hypertension in her father and mother; Non-Hodgkin's lymphoma in her mother.    Social History:   reports that she has never smoked. She has never used smokeless tobacco. She reports that she does not drink alcohol and does not use drugs.   Review of Systems: Constitutional: Denies weight loss/weight gain  Eyes: No changes in vision. ENT: No oral lesions, sore throat.  GI: see HPI.  Heme/Lymph: No easy bruising.  CV: No chest pain.  GU: No hematuria.  Integumentary: No rashes.  Neuro: No headaches.  Psych: No depression/anxiety.  Endocrine: No heat/cold intolerance.  Allergic/Immunologic: No urticaria.  Resp: No cough, SOB.  Musculoskeletal: No  joint swelling.    Physical Examination: BP 125/83 (BP Location: Right Arm, Patient Position: Sitting, Cuff Size: Normal)   Pulse 78   Temp 98.9 F (37.2 C) (Oral)   Ht 5' 1"  (1.549 m)   Wt 181 lb 4.8 oz (82.2 kg)   LMP 09/04/2015 (Approximate)   BMI 34.26 kg/m  Gen: NAD, alert and oriented x 4 HEENT: PEERLA, EOMI, Neck: supple, no JVD Chest: CTA bilaterally, no wheezes, crackles, or other adventitious sounds CV: RRR, no m/g/c/r Abd: soft, NT, ND, +BS in all four quadrants; no HSM, guarding, ridigity, or rebound tenderness Ext: no edema, well perfused with 2+ pulses, Skin: no rash or lesions noted on observed skin Lymph: no noted LAD  Data Reviewed:  08/29/19-vitamin D low at 18, Glucose 237 otherwise CMP normal, free T4 and TSH normal. (Per patient she started vitamin D replacement after labs)  Calprotectin 07/10/19--elevated at 523   Assessment/Plan: Ms. Seyller is a 49 y.o. female    Brittney [name] was seen today for follow-up.  Diagnoses and all orders for this visit:  Ulcerative colitis without complications, unspecified location ([place]) -     Cancel: Pancreatic elastase, fecal -     Calprotectin, Fecal -     CALPROTECTIN    1. UC -symptoms improving with Canasa 3 times a week and increasing her to high-dose mesalamine 4.8 g a day.  She will continue her oral mesalamine. She feels her current regimen is controlling symptoms fairly well and will not escalate further to biologic therapy.  If doing well can also decrease canasa to 2x/week, then once a week then off. She is to contact me if she develops any recurrence of symptoms.  Recent course of amoxicillin-recommended she start a probiotic to decrease the risk of post antibiotic C. difficile given underlying IBD  Follow-up 6 months, sooner if needed  I personally performed the service, non-incident to. (WP)  Laurine Blazer, [facility]

## 2019-11-19 ENCOUNTER — Other Ambulatory Visit (HOSPITAL_BASED_OUTPATIENT_CLINIC_OR_DEPARTMENT_OTHER): Payer: Self-pay

## 2019-11-19 DIAGNOSIS — R5383 Other fatigue: Secondary | ICD-10-CM

## 2019-11-19 DIAGNOSIS — R0683 Snoring: Secondary | ICD-10-CM

## 2019-11-19 DIAGNOSIS — G4710 Hypersomnia, unspecified: Secondary | ICD-10-CM

## 2019-11-22 ENCOUNTER — Other Ambulatory Visit (INDEPENDENT_AMBULATORY_CARE_PROVIDER_SITE_OTHER): Payer: Self-pay | Admitting: Gastroenterology

## 2019-11-22 ENCOUNTER — Telehealth (INDEPENDENT_AMBULATORY_CARE_PROVIDER_SITE_OTHER): Payer: Self-pay | Admitting: Internal Medicine

## 2019-11-22 DIAGNOSIS — K51919 Ulcerative colitis, unspecified with unspecified complications: Secondary | ICD-10-CM

## 2019-11-22 NOTE — Telephone Encounter (Signed)
Spoke with the patient today regarding her symptoms, states that for the last month and a half she has presented recurrent intermittent rectal bleeding and mucus in her stool along with bloating episodes.  She said that the symptoms were not present during her last appointment.  Patient will need to follow-up in the clinic next available appointment.  Will order CBC, CMP, CRP, fecal calprotectin and C. difficile testing in stool.   Mitzie, please set up an appointment for her.  Crystal, please send her the lab orders.  Thanks  [name], MD Gastroenterology and Hepatology [facility]

## 2019-11-22 NOTE — Progress Notes (Signed)
Spoke with the patient today regarding her symptoms, states that for the last month and a half she has presented recurrent intermittent rectal bleeding and mucus in her stool along with bloating episodes.  She said that the symptoms were not present during her last appointment.  Patient will need to follow-up in the clinic next available appointment.  Will order CBC, CMP, CRP, fecal calprotectin and C. difficile testing in stool.  [name], MD Gastroenterology and Hepatology [facility]

## 2019-11-22 NOTE — Telephone Encounter (Signed)
Patient left voice mail message stating she is having a UC flare - states she is having some bloating and abdominal pain - please advise - ph# [phone]

## 2019-11-25 ENCOUNTER — Other Ambulatory Visit (INDEPENDENT_AMBULATORY_CARE_PROVIDER_SITE_OTHER): Payer: Self-pay

## 2019-11-25 DIAGNOSIS — E538 Deficiency of other specified B group vitamins: Secondary | ICD-10-CM

## 2019-11-25 DIAGNOSIS — D508 Other iron deficiency anemias: Secondary | ICD-10-CM

## 2019-11-25 DIAGNOSIS — K3184 Gastroparesis: Secondary | ICD-10-CM

## 2019-11-25 DIAGNOSIS — K51919 Ulcerative colitis, unspecified with unspecified complications: Secondary | ICD-10-CM

## 2019-11-25 NOTE — Telephone Encounter (Signed)
Mailed lab orders to the patient to go to [facility] on 11/25/2019. CLS

## 2019-12-02 ENCOUNTER — Other Ambulatory Visit: Payer: Self-pay | Admitting: Nurse Practitioner

## 2019-12-03 LAB — MICROALBUMIN / CREATININE URINE RATIO
Creatinine, Urine: 112 mg/dL (ref 20–275)
Microalb Creat Ratio: 24 mcg/mg creat (ref <30)
Microalb, Ur: 2.7 mg/dL

## 2019-12-03 LAB — CBC WITH DIFFERENTIAL/PLATELET
Absolute Monocytes: 271 cells/uL (ref 200–950)
Basophils Absolute: 0 cells/uL (ref 0–200)
Basophils Relative: 0 %
Eosinophils Absolute: 0 cells/uL — ABNORMAL LOW (ref 15–500)
Eosinophils Relative: 0 %
HCT: 40.4 % (ref 35.0–45.0)
Hemoglobin: 13.1 g/dL (ref 11.7–15.5)
Lymphs Abs: 1144 cells/uL (ref 850–3900)
MCH: 27.7 pg (ref 27.0–33.0)
MCHC: 32.4 g/dL (ref 32.0–36.0)
MCV: 85.4 fL (ref 80.0–100.0)
MPV: 12.4 fL (ref 7.5–12.5)
Monocytes Relative: 6.3 %
Neutro Abs: 2885 cells/uL (ref 1500–7800)
Neutrophils Relative %: 67.1 %
Platelets: 160 10*3/uL (ref 140–400)
RBC: 4.73 10*6/uL (ref 3.80–5.10)
RDW: 12.6 % (ref 11.0–15.0)
Total Lymphocyte: 26.6 %
WBC: 4.3 10*3/uL (ref 3.8–10.8)

## 2019-12-03 LAB — COMPREHENSIVE METABOLIC PANEL
AG Ratio: 1.9 (calc) (ref 1.0–2.5)
ALT: 26 U/L (ref 6–29)
AST: 21 U/L (ref 10–35)
Albumin: 4.3 g/dL (ref 3.6–5.1)
Alkaline phosphatase (APISO): 110 U/L (ref 31–125)
BUN: 9 mg/dL (ref 7–25)
CO2: 33 mmol/L — ABNORMAL HIGH (ref 20–32)
Calcium: 9.5 mg/dL (ref 8.6–10.2)
Chloride: 104 mmol/L (ref 98–110)
Creat: 0.58 mg/dL (ref 0.50–1.10)
Globulin: 2.3 g/dL (calc) (ref 1.9–3.7)
Glucose, Bld: 220 mg/dL — ABNORMAL HIGH (ref 65–99)
Potassium: 4.1 mmol/L (ref 3.5–5.3)
Sodium: 143 mmol/L (ref 135–146)
Total Bilirubin: 0.7 mg/dL (ref 0.2–1.2)
Total Protein: 6.6 g/dL (ref 6.1–8.1)

## 2019-12-03 LAB — HEMOGLOBIN A1C
Hgb A1c MFr Bld: 8.1 % of total Hgb — ABNORMAL HIGH (ref <5.7)
Mean Plasma Glucose: 186 (calc)
eAG (mmol/L): 10.3 (calc)

## 2019-12-03 LAB — C-REACTIVE PROTEIN: CRP: 1.4 mg/L (ref <8.0)

## 2019-12-04 ENCOUNTER — Ambulatory Visit: Payer: Medicaid Other | Admitting: "Endocrinology

## 2019-12-05 ENCOUNTER — Other Ambulatory Visit (HOSPITAL_COMMUNITY): Payer: Self-pay

## 2019-12-06 ENCOUNTER — Inpatient Hospital Stay (HOSPITAL_COMMUNITY): Payer: Medicaid Other | Attending: Hematology

## 2019-12-06 ENCOUNTER — Other Ambulatory Visit: Payer: Self-pay

## 2019-12-06 DIAGNOSIS — E1065 Type 1 diabetes mellitus with hyperglycemia: Secondary | ICD-10-CM | POA: Diagnosis not present

## 2019-12-06 DIAGNOSIS — Z807 Family history of other malignant neoplasms of lymphoid, hematopoietic and related tissues: Secondary | ICD-10-CM | POA: Insufficient documentation

## 2019-12-06 DIAGNOSIS — Z923 Personal history of irradiation: Secondary | ICD-10-CM | POA: Diagnosis not present

## 2019-12-06 DIAGNOSIS — F32A Depression, unspecified: Secondary | ICD-10-CM | POA: Insufficient documentation

## 2019-12-06 DIAGNOSIS — Z794 Long term (current) use of insulin: Secondary | ICD-10-CM | POA: Diagnosis not present

## 2019-12-06 DIAGNOSIS — Z833 Family history of diabetes mellitus: Secondary | ICD-10-CM | POA: Insufficient documentation

## 2019-12-06 DIAGNOSIS — G4700 Insomnia, unspecified: Secondary | ICD-10-CM | POA: Diagnosis not present

## 2019-12-06 DIAGNOSIS — Z800 Family history of malignant neoplasm of digestive organs: Secondary | ICD-10-CM | POA: Insufficient documentation

## 2019-12-06 DIAGNOSIS — Z853 Personal history of malignant neoplasm of breast: Secondary | ICD-10-CM | POA: Diagnosis present

## 2019-12-06 DIAGNOSIS — C50411 Malignant neoplasm of upper-outer quadrant of right female breast: Secondary | ICD-10-CM

## 2019-12-06 DIAGNOSIS — D509 Iron deficiency anemia, unspecified: Secondary | ICD-10-CM | POA: Diagnosis present

## 2019-12-06 DIAGNOSIS — Z79899 Other long term (current) drug therapy: Secondary | ICD-10-CM | POA: Insufficient documentation

## 2019-12-06 DIAGNOSIS — Z803 Family history of malignant neoplasm of breast: Secondary | ICD-10-CM | POA: Insufficient documentation

## 2019-12-06 DIAGNOSIS — Z9221 Personal history of antineoplastic chemotherapy: Secondary | ICD-10-CM | POA: Diagnosis not present

## 2019-12-06 DIAGNOSIS — K219 Gastro-esophageal reflux disease without esophagitis: Secondary | ICD-10-CM | POA: Diagnosis not present

## 2019-12-06 DIAGNOSIS — Z8249 Family history of ischemic heart disease and other diseases of the circulatory system: Secondary | ICD-10-CM | POA: Diagnosis not present

## 2019-12-06 LAB — COMPREHENSIVE METABOLIC PANEL
ALT: 28 U/L (ref 0–44)
AST: 22 U/L (ref 15–41)
Albumin: 4 g/dL (ref 3.5–5.0)
Alkaline Phosphatase: 102 U/L (ref 38–126)
Anion gap: 8 (ref 5–15)
BUN: 9 mg/dL (ref 6–20)
CO2: 29 mmol/L (ref 22–32)
Calcium: 9.4 mg/dL (ref 8.9–10.3)
Chloride: 103 mmol/L (ref 98–111)
Creatinine, Ser: 0.52 mg/dL (ref 0.44–1.00)
GFR, Estimated: >60 mL/min (ref >60)
Glucose, Bld: 87 mg/dL (ref 70–99)
Potassium: 3.8 mmol/L (ref 3.5–5.1)
Sodium: 140 mmol/L (ref 135–145)
Total Bilirubin: 1.1 mg/dL (ref 0.3–1.2)
Total Protein: 7 g/dL (ref 6.5–8.1)

## 2019-12-06 LAB — CBC WITH DIFFERENTIAL/PLATELET
Abs Immature Granulocytes: 0.03 10*3/uL (ref 0.00–0.07)
Basophils Absolute: 0 10*3/uL (ref 0.0–0.1)
Basophils Relative: 0 %
Eosinophils Absolute: 0 10*3/uL (ref 0.0–0.5)
Eosinophils Relative: 0 %
HCT: 42.1 % (ref 36.0–46.0)
Hemoglobin: 13.1 g/dL (ref 12.0–15.0)
Immature Granulocytes: 1 %
Lymphocytes Relative: 19 %
Lymphs Abs: 0.9 10*3/uL (ref 0.7–4.0)
MCH: 27.4 pg (ref 26.0–34.0)
MCHC: 31.1 g/dL (ref 30.0–36.0)
MCV: 88.1 fL (ref 80.0–100.0)
Monocytes Absolute: 0.4 10*3/uL (ref 0.1–1.0)
Monocytes Relative: 8 %
Neutro Abs: 3.5 10*3/uL (ref 1.7–7.7)
Neutrophils Relative %: 72 %
Platelets: 125 10*3/uL — ABNORMAL LOW (ref 150–400)
RBC: 4.78 MIL/uL (ref 3.87–5.11)
RDW: 13.2 % (ref 11.5–15.5)
WBC: 4.8 10*3/uL (ref 4.0–10.5)
nRBC: 0 % (ref 0.0–0.2)

## 2019-12-06 LAB — IRON AND TIBC
Iron: 45 ug/dL (ref 28–170)
Saturation Ratios: 13 % (ref 10.4–31.8)
TIBC: 341 ug/dL (ref 250–450)
UIBC: 296 ug/dL

## 2019-12-06 LAB — FOLATE: Folate: 10.4 ng/mL (ref >5.9)

## 2019-12-06 LAB — FERRITIN: Ferritin: 292 ng/mL (ref 11–307)

## 2019-12-06 LAB — LACTATE DEHYDROGENASE: LDH: 144 U/L (ref 98–192)

## 2019-12-06 LAB — VITAMIN D 25 HYDROXY (VIT D DEFICIENCY, FRACTURES): Vit D, 25-Hydroxy: 51.33 ng/mL (ref 30–100)

## 2019-12-06 LAB — VITAMIN B12: Vitamin B-12: 716 pg/mL (ref 180–914)

## 2019-12-10 ENCOUNTER — Other Ambulatory Visit: Payer: Self-pay

## 2019-12-10 ENCOUNTER — Encounter: Payer: Self-pay | Admitting: "Endocrinology

## 2019-12-10 ENCOUNTER — Ambulatory Visit (INDEPENDENT_AMBULATORY_CARE_PROVIDER_SITE_OTHER): Payer: Medicaid Other | Admitting: "Endocrinology

## 2019-12-10 VITALS — BP 122/78 | HR 92 | Ht 61.0 in | Wt 183.0 lb

## 2019-12-10 DIAGNOSIS — E559 Vitamin D deficiency, unspecified: Secondary | ICD-10-CM

## 2019-12-10 DIAGNOSIS — E782 Mixed hyperlipidemia: Secondary | ICD-10-CM

## 2019-12-10 DIAGNOSIS — E1065 Type 1 diabetes mellitus with hyperglycemia: Secondary | ICD-10-CM | POA: Diagnosis not present

## 2019-12-10 LAB — C. DIFFICILE GDH AND TOXIN A/B
GDH ANTIGEN: NOT DETECTED
MICRO NUMBER:: 11256056
SPECIMEN QUALITY:: ADEQUATE
TOXIN A AND B: NOT DETECTED

## 2019-12-10 LAB — CALPROTECTIN: Calprotectin: 292 mcg/g — ABNORMAL HIGH

## 2019-12-10 NOTE — Patient Instructions (Signed)

## 2019-12-10 NOTE — Progress Notes (Signed)
12/10/2019, 9:54 AM     Endocrinology follow-up note  Subjective:    Patient ID: Brittney [name], female    DOB: July 31, 1970.  Brittney [name] is being seen in follow-up  for management of currently uncontrolled symptomatic diabetes requested by  [name], MD.   Past Medical History:  Diagnosis Date  . Breast cancer ([place])   . Breast cancer of upper-outer quadrant of right female breast ([place]) 06/11/2015   Triple negative, 2 cm   . Chemotherapy induced neutropenia ([place]) 09/10/2015  . Diabetes mellitus ([place])    Type 1 over 15 yrs  . Environmental allergies   . GERD (gastroesophageal reflux disease)   . Iron deficiency anemia due to chronic blood loss 06/12/2015  . Personal history of chemotherapy   . Personal history of radiation therapy   . 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 BIOPSY  12/05/2018   Procedure: BIOPSY;  Surgeon: Rogene Houston, MD;  [facility];  Service: Endoscopy;;  duodenum gastric colon  . BREAST LUMPECTOMY Right 2017  . CESAREAN SECTION    . COLONOSCOPY N/A 10/09/2013   Procedure: COLONOSCOPY;  Surgeon: Rogene Houston, MD;  Location[facility];  Service: Endoscopy;  Laterality: N/A;  100  . COLONOSCOPY N/A 12/05/2018   Procedure: COLONOSCOPY;  Surgeon: Rogene Houston, MD;  [facility];  Service: Endoscopy;  Laterality: N/A;  . DILATION AND CURETTAGE OF UTERUS     x 2 for miscarriages  . ESOPHAGOGASTRODUODENOSCOPY    . ESOPHAGOGASTRODUODENOSCOPY N/A 12/05/2018   Procedure: ESOPHAGOGASTRODUODENOSCOPY (EGD);  Surgeon: Rogene Houston, MD;  [facility];  Service: Endoscopy;  Laterality: N/A;  200pm  . FLEXIBLE SIGMOIDOSCOPY    . FLEXIBLE SIGMOIDOSCOPY  07/27/2011   Procedure: FLEXIBLE SIGMOIDOSCOPY;  Surgeon: Rogene Houston, MD;  [facility];   Service: Endoscopy;  Laterality: N/A;  100    Social History   Socioeconomic History  . Marital status: Single    Spouse name: Not on file  . Number of children: Not on file  . Years of education: Not on file  . Highest education level: Not on file  Occupational History  . Not on file  Tobacco Use  . Smoking status: Never Smoker  . Smokeless tobacco: Never Used  Vaping Use  . Vaping Use: Never used  Substance and Sexual Activity  . Alcohol use: No    Alcohol/week: 0.0 standard drinks  . Drug use: No  . Sexual activity: Not on file  Other Topics Concern  . Not on file  Social History Narrative  . Not on file   Social Determinants of Health   Financial Resource Strain:   . Difficulty of Paying Living Expenses: Not on file  Food Insecurity:   . Worried About Charity fundraiser in the Last Year: Not on file  . Ran Out of Food in the Last Year: Not on file  Transportation Needs:   . Lack of Transportation (Medical): Not on file  .  Lack of Transportation (Non-Medical): Not on file  Physical Activity:   . Days of Exercise per Week: Not on file  . Minutes of Exercise per Session: Not on file  Stress:   . Feeling of Stress : Not on file  Social Connections:   . Frequency of Communication with Friends and Family: Not on file  . Frequency of Social Gatherings with Friends and Family: Not on file  . Attends Religious Services: Not on file  . Active Member of Clubs or Organizations: Not on file  . Attends Archivist Meetings: Not on file  . Marital Status: Not on file    Family History  Problem Relation Age of Onset  . Non-Hodgkin's lymphoma Mother   . Hypertension Mother   . Diabetes Mellitus II Mother   . Hypertension Father   . Diabetes Mellitus II Father   . Breast cancer Cousin   . Breast cancer Maternal Aunt   . Colon cancer Maternal Uncle     Outpatient Encounter Medications as of 12/10/2019  Medication Sig  . ACCU-CHEK AVIVA PLUS test strip  SMARTSIG:Via Meter 6 Times Daily  . acetaminophen (TYLENOL) 500 MG tablet Take 1,000 mg by mouth every 6 (six) hours as needed. For pain  . acyclovir (ZOVIRAX) 200 MG capsule Take 400 mg by mouth daily. Reported on 07/23/2015  . albuterol (PROVENTIL HFA;VENTOLIN HFA) 108 (90 Base) MCG/ACT inhaler Inhale 2 puffs into the lungs every 4 (four) hours as needed for wheezing or shortness of breath.   . ARIPiprazole (ABILIFY) 10 MG tablet Take 10 mg by mouth at bedtime.   [name] atorvastatin (LIPITOR) 20 MG tablet Take 20 mg by mouth daily.   . Continuous Blood Gluc Sensor (DEXCOM G6 SENSOR) MISC SMARTSIG:1 Each Topical Every 10 Days  . Continuous Blood Gluc Transmit (DEXCOM G6 TRANSMITTER) MISC   . diclofenac Sodium (VOLTAREN) 1 % GEL Apply 1 application topically 3 (three) times daily.  [name] DULAGLUTIDE [place] Inject into the skin.  [name] gabapentin (NEURONTIN) 300 MG capsule Take 300-600 mg by mouth See admin instructions. Take 1 capsule (300 mg) by mouth in the morning & take 2 capsules (600 mg) by mouth at night  . HUMALOG 100 UNIT/ML injection Inject 9-12 Units into the skin 3 (three) times daily with meals. Sliding Scale Insulin  . LANTUS SOLOSTAR 100 UNIT/ML Solostar Pen Inject 26 Units into the skin at bedtime.  [name] loratadine (CLARITIN) 10 MG tablet Take 10 mg by mouth daily.   [name] losartan (COZAAR) 25 MG tablet Take 25 mg by mouth daily.  . Mesalamine (ASACOL HD) 800 MG TBEC Take 2 tablets (1,600 mg total) by mouth in the morning, at noon, and at bedtime.  . mesalamine (CANASA) 1000 MG suppository Place 1 suppository (1,000 mg total) rectally at bedtime. Take 3x/week.  . Multiple Vitamin (MULTIVITAMIN ADULT PO) Take 1 tablet by mouth daily.  [name] omeprazole (PRILOSEC) 20 MG capsule Take 40 mg by mouth 2 (two) times daily before a meal. Before breakfast & supper  . traZODone (DESYREL) 50 MG tablet Take 50 mg by mouth at bedtime.    Facility-Administered Encounter Medications as of 12/10/2019  Medication  . 0.9 %   sodium chloride infusion  . 0.9 %  sodium chloride infusion    ALLERGIES: Allergies  Allergen Reactions  . Ace Inhibitors Itching  . Peanut Oil Itching  . Shellfish Allergy Itching and Rash  . Statins Itching  . Adhesive [Tape] Rash  . Pravastatin Sodium Itching  VACCINATION STATUS: Immunization History  Administered Date(s) Administered  . Influenza,inj,Quad PF,6+ Mos 10/22/2015  . Pneumococcal Polysaccharide-23 01/03/2006  . Td 10/04/2006    Diabetes She presents for her follow-up diabetic visit. She has type 1 diabetes mellitus. Onset time: She was diagnosed at approximate age of 54 years. Her disease course has been improving. Pertinent negatives for hypoglycemia include no confusion, nervousness/anxiousness, pallor or seizures. Associated symptoms include fatigue, polydipsia and polyuria. Pertinent negatives for diabetes include no chest pain and no polyphagia. There are no hypoglycemic complications. Symptoms are improving. There are no diabetic complications. Risk factors for coronary artery disease include diabetes mellitus, dyslipidemia, obesity and sedentary lifestyle. Current diabetic treatment includes insulin injections. Her weight is fluctuating minimally. She is following a generally unhealthy diet. When asked about meal planning, she reported none. She has not had a previous visit with a dietitian. Her home blood glucose trend is fluctuating dramatically. Her breakfast blood glucose range is generally >200 mg/dl. Her lunch blood glucose range is generally 180-200 mg/dl. Her dinner blood glucose range is generally 180-200 mg/dl. Her bedtime blood glucose range is generally >200 mg/dl. Her overall blood glucose range is >200 mg/dl. (She uses her Dexcom CGM device, continues to have significantly fluctuating glycemic profile.  She has been having them, moderate to severe hypoglycemia.  Her point-of-care A1c is 8.1%, overall improving from 9%.   ) An ACE inhibitor/angiotensin  II receptor blocker is not being taken.  Hyperlipidemia This is a chronic problem. The current episode started more than 1 year ago. The problem is controlled. Pertinent negatives include no chest pain, myalgias or shortness of breath. Current antihyperlipidemic treatment includes statins (She was treated with atorvastatin in the past, did not continue on this medication complaining about cost.). Risk factors for coronary artery disease include diabetes mellitus, dyslipidemia, obesity and family history.    Review of systems  Constitutional: + Minimally fluctuating body weight,  current  Body mass index is 34.58 kg/m. , no fatigue, no subjective hyperthermia, no subjective hypothermia Eyes: no blurry vision, no xerophthalmia ENT: no sore throat, no nodules palpated in throat, no dysphagia/odynophagia, no hoarseness Cardiovascular: no Chest Pain, no Shortness of Breath, no palpitations, no leg swelling Respiratory: no cough, no shortness of breath Gastrointestinal: no Nausea/Vomiting/Diarhhea Musculoskeletal: no muscle/joint aches Skin: no rashes, no hyperemia Neurological: no tremors, no numbness, no tingling, no dizziness Psychiatric: no depression, no anxiety   Objective:    BP 122/78   Pulse 92   Ht 5' 1"  (1.549 m)   Wt 183 lb (83 kg)   LMP 09/04/2015 (Approximate)   BMI 34.58 kg/m   Wt Readings from Last 3 Encounters:  12/10/19 183 lb (83 kg)  09/19/19 181 lb 4.8 oz (82.2 kg)  09/03/19 180 lb (81.6 kg)      Physical Exam- Limited  Constitutional:  Body mass index is 34.58 kg/m. , not in acute distress, normal state of mind Eyes:  EOMI, no exophthalmos Neck: Supple Thyroid: No gross goiter Musculoskeletal: no gross deformities, strength intact in all four extremities, no gross restriction of joint movements Skin:  no rashes, no hyperemia Neurological: no tremor with outstretched hands,    Recent Results (from the past 2160 hour(s))  CBC with Differential/Platelet      Status: Abnormal   Collection Time: 12/02/19  8:42 AM  Result Value Ref Range   WBC 4.3 3.8 - 10.8 Thousand/uL   RBC 4.73 3.80 - 5.10 Million/uL   Hemoglobin 13.1 11.7 - 15.5 g/dL  HCT 40.4 35 - 45 %   MCV 85.4 80.0 - 100.0 fL   MCH 27.7 27.0 - 33.0 pg   MCHC 32.4 32.0 - 36.0 g/dL   RDW 12.6 11.0 - 15.0 %   Platelets 160 140 - 400 Thousand/uL   MPV 12.4 7.5 - 12.5 fL   Neutro Abs 2,885 1,500 - 7,800 cells/uL   Lymphs Abs 1,144 850 - 3,900 cells/uL   Absolute Monocytes 271 200 - 950 cells/uL   Eosinophils Absolute 0 (L) 15.0 - 500.0 cells/uL   Basophils Absolute 0 0.0 - 200.0 cells/uL   Neutrophils Relative % 67.1 %   Total Lymphocyte 26.6 %   Monocytes Relative 6.3 %   Eosinophils Relative 0.0 %   Basophils Relative 0.0 %  Comprehensive metabolic panel     Status: Abnormal   Collection Time: 12/02/19  8:42 AM  Result Value Ref Range   Glucose, Bld 220 (H) 65 - 99 mg/dL    Comment: .            Fasting reference interval . For someone without known diabetes, a glucose value >125 mg/dL indicates that they may have diabetes and this should be confirmed with a follow-up test. .    BUN 9 7 - 25 mg/dL   Creat 0.58 0.50 - 1.10 mg/dL   BUN/Creatinine Ratio NOT APPLICABLE 6 - 22 (calc)   Sodium 143 135 - 146 mmol/L   Potassium 4.1 3.5 - 5.3 mmol/L   Chloride 104 98 - 110 mmol/L   CO2 33 (H) 20 - 32 mmol/L   Calcium 9.5 8.6 - 10.2 mg/dL   Total Protein 6.6 6.1 - 8.1 g/dL   Albumin 4.3 3.6 - 5.1 g/dL   Globulin 2.3 1.9 - 3.7 g/dL (calc)   AG Ratio 1.9 1.0 - 2.5 (calc)   Total Bilirubin 0.7 0.2 - 1.2 mg/dL   Alkaline phosphatase (APISO) 110 31 - 125 U/L   AST 21 10 - 35 U/L   ALT 26 6 - 29 U/L  C-reactive protein     Status: None   Collection Time: 12/02/19  8:42 AM  Result Value Ref Range   CRP 1.4 <8.0 mg/L  C. difficile GDH and Toxin A/B     Status: None   Collection Time: 12/02/19  8:44 AM  Result Value Ref Range   MICRO NUMBER: [id number]    SPECIMEN QUALITY:  Adequate    Source STOOL    STATUS: FINAL    GDH ANTIGEN Not Detected    TOXIN A AND B Not Detected    COMMENT      No toxigenic C. difficile detected For additional information, please refer to http://education.[facility].com/faq/FAQ136 (This link is being provided for informational/educational purposes only.)  Microalbumin / creatinine urine ratio     Status: None   Collection Time: 12/02/19  8:46 AM  Result Value Ref Range   Creatinine, Urine 112 20 - 275 mg/dL   Microalb, Ur 2.7 mg/dL    Comment: Reference Range Not established    Microalb Creat Ratio 24 <30 mcg/mg creat    Comment: . The ADA defines abnormalities in albumin excretion as follows: [name] Albuminuria Category        Result (mcg/mg creatinine) . Normal to Mildly increased   <30 Moderately increased         30-299  Severely increased           > OR = 300 . The ADA recommends that at least two of  three specimens collected within a 3-6 month period be abnormal before considering a patient to be within a diagnostic category.   Hemoglobin A1c     Status: Abnormal   Collection Time: 12/02/19  8:46 AM  Result Value Ref Range   Hgb A1c MFr Bld 8.1 (H) <5.7 % of total Hgb    Comment: For someone without known diabetes, a hemoglobin A1c value of 6.5% or greater indicates that they may have  diabetes and this should be confirmed with a follow-up  test. . For someone with known diabetes, a value <7% indicates  that their diabetes is well controlled and a value  greater than or equal to 7% indicates suboptimal  control. A1c targets should be individualized based on  duration of diabetes, age, comorbid conditions, and  other considerations. . Currently, no consensus exists regarding use of hemoglobin A1c for diagnosis of diabetes for children. .    Mean Plasma Glucose 186 (calc)   eAG (mmol/L) 10.3 (calc)  Folate     Status: None   Collection Time: 12/06/19 12:07 PM  Result Value Ref Range   Folate 10.4  >5.9 ng/mL    Comment: Performed at [facility], [address], [place], [place] [address]  Lactate dehydrogenase     Status: None   Collection Time: 12/06/19 12:07 PM  Result Value Ref Range   LDH 144 98 - 192 U/L    Comment: Performed at [facility], [address] [place], [place] [address]  CBC with Differential/Platelet     Status: Abnormal   Collection Time: 12/06/19 12:07 PM  Result Value Ref Range   WBC 4.8 4.0 - 10.5 K/uL   RBC 4.78 3.87 - 5.11 MIL/uL   Hemoglobin 13.1 12.0 - 15.0 g/dL   HCT 42.1 36 - 46 %   MCV 88.1 80.0 - 100.0 fL   MCH 27.4 26.0 - 34.0 pg   MCHC 31.1 30.0 - 36.0 g/dL   RDW 13.2 11.5 - 15.5 %   Platelets 125 (L) 150 - 400 K/uL   nRBC 0.0 0.0 - 0.2 %   Neutrophils Relative % 72 %   Neutro Abs 3.5 1.7 - 7.7 K/uL   Lymphocytes Relative 19 %   Lymphs Abs 0.9 0.7 - 4.0 K/uL   Monocytes Relative 8 %   Monocytes Absolute 0.4 0.1 - 1.0 K/uL   Eosinophils Relative 0 %   Eosinophils Absolute 0.0 0.0 - 0.5 K/uL   Basophils Relative 0 %   Basophils Absolute 0.0 0.0 - 0.1 K/uL   Immature Granulocytes 1 %   Abs Immature Granulocytes 0.03 0.00 - 0.07 K/uL    Comment: Performed at [facility], [address], [place], [place] [address]  Comprehensive metabolic panel     Status: None   Collection Time: 12/06/19 12:07 PM  Result Value Ref Range   Sodium 140 135 - 145 mmol/L   Potassium 3.8 3.5 - 5.1 mmol/L   Chloride 103 98 - 111 mmol/L   CO2 29 22 - 32 mmol/L   Glucose, Bld 87 70 - 99 mg/dL    Comment: Glucose reference range applies only to samples taken after fasting for at least 8 hours.   BUN 9 6 - 20 mg/dL   Creatinine, Ser 0.52 0.44 - 1.00 mg/dL   Calcium 9.4 8.9 - 10.3 mg/dL   Total Protein 7.0 6.5 - 8.1 g/dL   Albumin 4.0 3.5 - 5.0 g/dL   AST 22 15 - 41 U/L  ALT 28 0 - 44 U/L   Alkaline Phosphatase 102 38 - 126 U/L   Total Bilirubin 1.1 0.3 - 1.2 mg/dL   GFR, Estimated >60 >60 mL/min    Comment: (NOTE) Calculated using the CKD-EPI Creatinine  Equation (2021)    Anion gap 8 5 - 15    Comment: Performed at [facility], [address], [place], [place] [address]  VITAMIN D 25 Hydroxy (Vit-D Deficiency, Fractures)     Status: None   Collection Time: 12/06/19 12:07 PM  Result Value Ref Range   Vit D, 25-Hydroxy 51.33 30 - 100 ng/mL    Comment: (NOTE) Vitamin D deficiency has been defined by the [place] practice guideline as a level of serum 25-OH  vitamin D less than 20 ng/mL (1,2). The Endocrine Society went on to  further define vitamin D insufficiency as a level between 21 and 29  ng/mL (2).  1. IOM (Institute of Medicine). 2010. Dietary reference intakes for  calcium and D. [place]: The [employer]. 2. Holick MF, Binkley [place], Bischoff-Ferrari HA, et al. Evaluation,  treatment, and prevention of vitamin D deficiency: an Endocrine  Society clinical practice guideline, JCEM. 2011 Jul; 96(7): 1911-30.  Performed at [facility], [place] [address] [place], [place] [address]   Ferritin     Status: None   Collection Time: 12/06/19 12:08 PM  Result Value Ref Range   Ferritin 292 11 - 307 ng/mL    Comment: Performed at [facility], [address], [place], [place] [address]  Iron and TIBC     Status: None   Collection Time: 12/06/19 12:08 PM  Result Value Ref Range   Iron 45 28 - 170 ug/dL   TIBC 341 250 - 450 ug/dL   Saturation Ratios 13 10.4 - 31.8 %   UIBC 296 ug/dL    Comment: Performed at [facility], [address], [place], [place] [address]  Vitamin B12     Status: None   Collection Time: 12/06/19 12:08 PM  Result Value Ref Range   Vitamin B-12 716 180 - 914 pg/mL    Comment: (NOTE) This assay is not validated for testing neonatal or myeloproliferative syndrome specimens for Vitamin B12 levels. Performed at [facility], [address] [place], [place] [address]      Diabetic Labs (most recent): Lab Results  Component Value Date   HGBA1C 8.1  (H) 12/02/2019   HGBA1C 8.1 (A) 05/31/2019   HGBA1C 8.4 (H) 12/13/2018     Lipid Panel ( most recent) Lipid Panel     Component Value Date/Time   CHOL 154 08/29/2019 0942   TRIG 49 08/29/2019 0942   HDL 67 08/29/2019 0942   CHOLHDL 2.3 08/29/2019 0942   LDLCALC 73 08/29/2019 0942   CMP Latest Ref Rng & Units 12/06/2019 12/02/2019 08/29/2019  Glucose 70 - 99 mg/dL 87 220(H) 337(H)  BUN 6 - 20 mg/dL 9 9 10   Creatinine 0.44 - 1.00 mg/dL 0.52 0.58 0.68  Sodium 135 - 145 mmol/L 140 143 142  Potassium 3.5 - 5.1 mmol/L 3.8 4.1 4.2  Chloride 98 - 111 mmol/L 103 104 104  CO2 22 - 32 mmol/L 29 33(H) 32  Calcium 8.9 - 10.3 mg/dL 9.4 9.5 9.0  Total Protein 6.5 - 8.1 g/dL 7.0 6.6 6.1  Total Bilirubin 0.3 - 1.2 mg/dL 1.1 0.7 0.9  Alkaline Phos 38 - 126 U/L 102 - -  AST 15 -  41 U/L 22 21 21   ALT 0 - 44 U/L 28 26 23      Assessment & Plan:   1. Uncontrolled type 1 diabetes mellitus with hyperglycemia (HCC)  - Brittney [name] has currently uncontrolled symptomatic type 2 DM since 49 years of age.  She uses her Dexcom CGM device, continues to have significantly fluctuating glycemic profile.  She has 43% time in range, 53% above range.  2% hypoglycemia.  Her previsit labs show A1c of 8.1% unchanged from her last visit.        Her recent anti-GAD antibody elevated at 61 suggesting type 1 diabetes.   -her diabetes is complicated by obesity/sedentary life and she remains at a high risk for more acute and chronic complications which include CAD, CVA, CKD, retinopathy, and neuropathy. These are all discussed in detail with her.  - I have counseled her on diet management and weight loss, by adopting a carbohydrate restricted/protein rich diet.  - she  admits there is a room for improvement in her diet and drink choices. -  Suggestion is made for her to avoid simple carbohydrates  from her diet including Cakes, Sweet Desserts / Pastries, Ice Cream, Soda (diet and regular), Sweet Tea, Candies,  Chips, Cookies, Sweet Pastries,  Store Bought Juices, Alcohol in Excess of  1-2 drinks a day, Artificial Sweeteners, Coffee Creamer, and "Sugar-free" Products. This will help patient to have stable blood glucose profile and potentially avoid unintended weight gain.   - I encouraged her to switch to  unprocessed or minimally processed complex starch and increased protein intake (animal or plant source), fruits, and vegetables.  - she is advised to stick to a routine mealtimes to eat 3 meals  a day and avoid unnecessary snacks ( to snack only to correct hypoglycemia).    - I have approached her with the following individualized plan to manage diabetes and patient agrees:   -She will continue to need intensive treatment with basal/bolus insulin in order for her to achieve and maintain control of diabetes to target.    -Due to her tendency for random hypoglycemia, priority will be to avoid hypoglycemia in her care.    -She is advised to increase her Lantus to 26 units nightly, increase Humalog to 9 units 3 times a day with meals  for pre-meal BG readings of 80-139m/dl, plus patient specific correction dose for unexpected hyperglycemia above 1558mdl, associated with strict monitoring of glucose 4 times a day-before meals and at bedtime. - she is warned not to take insulin without proper monitoring per orders. - Adjustment parameters are given to her for hypo and hyperglycemia in writing. - she is encouraged to call clinic for blood glucose levels less than 70 or above 200 mg /dl. -She is encouraged to continue to use her Dexcom at all times.  She is not a suitable insulin pump candidate at this time.  -She is  advised to adjust her Humalog on days that she was exercise.  - she is not a somewhat lost metformin, SGLT2 inhibitors, nor incretin therapy.   - Patient specific target  A1c;  LDL, HDL, Triglycerides, and  Waist Circumference were discussed in detail.  2) Blood Pressure /Hypertension:   -Her blood pressure is controlled to target.  She is not on anti-hypertensive medications for now.  3) Lipids/Hyperlipidemia: Review of her most recent lipid panel showed LDL improved to 76 from 119.  She is benefiting from atorvastatin, advised to continue atorvastatin 20 mg p.o. nightly.  4)  Weight/Diet: Her BMI is 34-   clearly complicating her diabetes care.  A candidate for modest weight loss, may avoid further weight gain by avoiding unnecessary snacks.  I discussed with her the fact that loss of 5 - 10% of her  current body weight will have the most impact on her diabetes management.   CDE Consult will be initiated . Exercise, and detailed carbohydrates information provided  -  detailed on discharge instructions.  5) Chronic Care/Health Maintenance:  -she  Is not on ACEI/ARB and Statin medications and  is encouraged to initiate and continue to follow up with Ophthalmology, Dentist,  Podiatrist at least yearly or according to recommendations, and advised to   stay away from smoking. I have recommended yearly flu vaccine and pneumonia vaccine at least every 5 years; moderate intensity exercise for up to 150 minutes weekly; and  sleep for at least 7 hours a day.  - she is  advised to maintain close follow up with [name], MD for primary care needs, as well as her other providers for optimal and coordinated care.  - Time spent on this patient care encounter:  35 min, of which > 50% was spent in  counseling and the rest reviewing her blood glucose logs , discussing her hypoglycemia and hyperglycemia episodes, reviewing her current and  previous labs / studies  ( including abstraction from other facilities) and medications  doses and developing a  long term treatment plan and documenting her care.   Please refer to Patient Instructions for Blood Glucose Monitoring and Insulin/Medications Dosing Guide"  in media tab for additional information. Please  also refer to " Patient Self  Inventory" in the Media  tab for reviewed elements of pertinent patient history.  [name] participated in the discussions, expressed understanding, and voiced agreement with the above plans.  All questions were answered to her satisfaction. she is encouraged to contact clinic should she have any questions or concerns prior to her return visit.   Follow up plan: - Return in about 6 months (around 06/09/2020) for F/U with Pre-visit Labs, Meter, Logs, A1c here., ABI in Office NV, Urine MA - NV.  Glade Lloyd, MD [facility] [address] [place], [place] [address] Phone: [phone]  Fax: [phone]    12/10/2019, 9:54 AM  This note was partially dictated with voice recognition software. Similar sounding words can be transcribed inadequately or may not  be corrected upon review.

## 2019-12-12 ENCOUNTER — Inpatient Hospital Stay (HOSPITAL_BASED_OUTPATIENT_CLINIC_OR_DEPARTMENT_OTHER): Payer: Medicaid Other | Admitting: Oncology

## 2019-12-12 ENCOUNTER — Encounter (HOSPITAL_COMMUNITY): Payer: Self-pay | Admitting: Oncology

## 2019-12-12 ENCOUNTER — Other Ambulatory Visit: Payer: Self-pay

## 2019-12-12 DIAGNOSIS — Z853 Personal history of malignant neoplasm of breast: Secondary | ICD-10-CM | POA: Diagnosis not present

## 2019-12-12 DIAGNOSIS — C50411 Malignant neoplasm of upper-outer quadrant of right female breast: Secondary | ICD-10-CM | POA: Diagnosis not present

## 2019-12-12 NOTE — Progress Notes (Signed)
Brittney [name], [place] [address]   CLINIC:  Medical Oncology/Hematology  PCP:  Brittney Bis, MD [place] [address] [phone]   REASON FOR VISIT: Follow-up for breast [name] and iron deficiency anemia   CURRENT THERAPY: Surveillance and intermittent iron infusions  BRIEF ONCOLOGIC HISTORY:  Oncology History  Malignant neoplasm of upper-outer quadrant of right female breast ([place])  04/29/2015 Initial Biopsy   upper outer quadrant mass R breast 2.9 cm by ultrasound, high grade invasive ductal carcinoma ER< 1%, PR 1%, HER -2 not amplified, Ki-67 66%   05/13/2015 [name] Staging   pT1cN0   05/13/2015 Surgery   lumpectomy and sentinel node biopsies X 3, 2 cm tumor, 3 negative nodes    05/21/2015 Imaging   CT [facility]P [facility] with postsurgical changes related to R breast lympectomy and R axillary LN dissection, no findings suspicious for metastatic disease   06/12/2015 Procedure   Port placement by Brittney [name]   06/17/2015 Imaging   MUGA- Normal left ventricular ejection fraction equal to 62.9%.   06/25/2015 - 08/13/2015 Chemotherapy   AC x 4   08/27/2015 - 09/03/2015 Chemotherapy   Weekly Taxol x 2 cycles    09/11/2015 - 09/15/2015 Hospital Admission   Neutropenic fever, ARF secondary to antibiotics   10/01/2015 - 11/12/2015 Chemotherapy   Taxotere 75 mg/m2 Q3 weeks x 3 cycles     10/01/2015 Treatment Plan Change   Taxol changed to Taxotere given hospitalization.     - 01/14/2016 Radiation Therapy   Radiation in [facility]   05/11/2016 Mammogram   IMPRESSION: No evidence of malignancy within either breast. Expected postsurgical changes within the right breast.      [name] STAGING: [name] Staging Malignant neoplasm of upper-outer quadrant of right female breast [facility]) Staging form: Breast, AJCC 7th Edition - Clinical stage from 05/13/2015: Stage IA (T1c, N0, M0) - Signed by Brittney [name] on 06/25/2015    INTERVAL  HISTORY:  Brittney [name] 49 y.o. female returns for routine follow-up for breast [name] and iron deficiency anemia.  She was last seen in hematology/oncology on 06/06/2019.  She received 2 doses of IV Feraheme on 06/13/2019 and 06/20/2019.   In the interim, she was seen by GI for history of ulcerative colitis.  Appeared to have symptoms but was stable on current medication regimen.  Plan was to avoid biologic therapy and continue Canasa 3 times a week and high-dose Asacol.  Fecal calprotectin showed evidence of active inflammation which was improving and C. difficile was negative.  Continued to have some rectal bleeding and mucus in her stools.  Occasional bloating.  She was evaluated by endocrinology on 09/03/2019 for uncontrolled type 1 diabetes with hyperglycemia incidents.  A1c 8.1.  She continued short acting and long-acting insulin.   Today, she is feeling well.  Endorses fatigue secondary to not sleeping well.  She has tried melatonin and has a prescription for trazodone that she is currently not taking.  She continues to have rectal bleeding but is followed closely by GI.  She feels her diabetes is under control.  She has intermittent tingling and pain to her right breast.  She has been seen by the lymphedema clinic and does use a sleeve and pump.  She denies any easy bruising or bleeding.  She denies any new lumps or bumps present.  She denies any new bone pain. Denies any nausea, vomiting, or diarrhea. Denies any new pains. Had not noticed  any recent bleeding such as epistaxis, hematuria or hematochezia. Denies recent chest pain on exertion, shortness of breath on minimal exertion, pre-syncopal episodes, or palpitations. Denies any numbness or tingling in hands or feet. Denies any recent fevers, infections, or recent hospitalizations. Patient reports appetite at 100% and energy level at 50%.  She is eating well maintain her weight at this time.   REVIEW OF SYSTEMS:  Review of Systems  Constitutional:  Positive for fatigue.  Gastrointestinal: Positive for abdominal pain and blood in stool.  Psychiatric/Behavioral: Positive for depression and sleep disturbance.  All other systems reviewed and are negative.    PAST MEDICAL/SURGICAL HISTORY:  Past Medical History:  Diagnosis Date  . Breast [name] ([place])   . Breast [name] of upper-outer quadrant of right female breast ([place]) 06/11/2015   Triple negative, 2 cm   . Chemotherapy induced neutropenia ([place]) 09/10/2015  . Diabetes mellitus ([place])    Type 1 over 15 yrs  . Environmental allergies   . GERD (gastroesophageal reflux disease)   . Iron deficiency anemia due to chronic blood loss 06/12/2015  . Personal history of chemotherapy   . Personal history of radiation therapy   . 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 BIOPSY  12/05/2018   Procedure: BIOPSY;  Surgeon: Brittney Houston, MD;  [facility];  Service: Endoscopy;;  duodenum gastric colon  . BREAST LUMPECTOMY Right 2017  . CESAREAN SECTION    . COLONOSCOPY N/A 10/09/2013   Procedure: COLONOSCOPY;  Surgeon: Brittney Houston, MD;  [facility];  Service: Endoscopy;  Laterality: N/A;  100  . COLONOSCOPY N/A 12/05/2018   Procedure: COLONOSCOPY;  Surgeon: Brittney Houston, MD;  [facility];  Service: Endoscopy;  Laterality: N/A;  . DILATION AND CURETTAGE OF UTERUS     x 2 for miscarriages  . ESOPHAGOGASTRODUODENOSCOPY    . ESOPHAGOGASTRODUODENOSCOPY N/A 12/05/2018   Procedure: ESOPHAGOGASTRODUODENOSCOPY (EGD);  Surgeon: Brittney Houston, MD;  [facility];  Service: Endoscopy;  Laterality: N/A;  200pm  . FLEXIBLE SIGMOIDOSCOPY    . FLEXIBLE SIGMOIDOSCOPY  07/27/2011   Procedure: FLEXIBLE SIGMOIDOSCOPY;  Surgeon: Brittney Houston, MD;  [facility];  Service: Endoscopy;  Laterality: N/A;  100     SOCIAL HISTORY:  Social History   Socioeconomic History  . Marital  status: Single    Spouse name: Not on file  . Number of children: Not on file  . Years of education: Not on file  . Highest education level: Not on file  Occupational History  . Not on file  Tobacco Use  . Smoking status: Never Smoker  . Smokeless tobacco: Never Used  Vaping Use  . Vaping Use: Never used  Substance and Sexual Activity  . Alcohol use: No    Alcohol/week: 0.0 standard drinks  . Drug use: No  . Sexual activity: Not on file  Other Topics Concern  . Not on file  Social History Narrative  . Not on file   Social Determinants of Health   Financial Resource Strain: Low Risk   . Difficulty of Paying Living Expenses: Not hard at all  Food Insecurity: No Food Insecurity  . Worried About Charity fundraiser in the Last Year: Never true  . Ran Out of Food in the Last Year: Never true  Transportation Needs: No Transportation Needs  . Lack of Transportation (Medical): No  .  Lack of Transportation (Non-Medical): No  Physical Activity: Inactive  . Days of Exercise per Week: 0 days  . Minutes of Exercise per Session: 0 min  Stress: No Stress Concern Present  . Feeling of Stress : Not at all  Social Connections: Moderately Integrated  . Frequency of Communication with Friends and Family: More than three times a week  . Frequency of Social Gatherings with Friends and Family: Three times a week  . Attends Religious Services: 1 to 4 times per year  . Active Member of Clubs or Organizations: No  . Attends Archivist Meetings: 1 to 4 times per year  . Marital Status: Never married  Intimate Partner Violence: Not At Risk  . Fear of Current or Ex-Partner: No  . Emotionally Abused: No  . Physically Abused: No  . Sexually Abused: No    FAMILY HISTORY:  Family History  Problem Relation Age of Onset  . Non-Hodgkin's lymphoma Mother   . Hypertension Mother   . Diabetes Mellitus II Mother   . Hypertension Father   . Diabetes Mellitus II Father   . Breast [name]  Cousin   . Breast [name] Maternal Aunt   . Colon [name] Maternal Uncle     CURRENT MEDICATIONS:  Outpatient Encounter Medications as of 12/12/2019  Medication Sig  . ACCU-CHEK AVIVA PLUS test strip SMARTSIG:Via Meter 6 Times Daily  . acetaminophen (TYLENOL) 500 MG tablet Take 1,000 mg by mouth every 6 (six) hours as needed. For pain  . acyclovir (ZOVIRAX) 200 MG capsule Take 400 mg by mouth daily. Reported on 07/23/2015  . albuterol (PROVENTIL HFA;VENTOLIN HFA) 108 (90 Base) MCG/ACT inhaler Inhale 2 puffs into the lungs every 4 (four) hours as needed for wheezing or shortness of breath.   . ARIPiprazole (ABILIFY) 30 MG tablet SMARTSIG:1 Tablet(s) By Mouth Every Evening  . atorvastatin (LIPITOR) 20 MG tablet Take 20 mg by mouth daily.   . Continuous Blood Gluc Sensor (DEXCOM G6 SENSOR) MISC SMARTSIG:1 Each Topical Every 10 Days  . Continuous Blood Gluc Transmit (DEXCOM G6 TRANSMITTER) MISC   . diclofenac Sodium (VOLTAREN) 1 % GEL Apply 1 application topically 3 (three) times daily.  [name] DULAGLUTIDE [place] Inject into the skin.  [name] gabapentin (NEURONTIN) 300 MG capsule Take 300-600 mg by mouth See admin instructions. Take 1 capsule (300 mg) by mouth in the morning & take 2 capsules (600 mg) by mouth at night  . HUMALOG 100 UNIT/ML injection Inject 9-12 Units into the skin 3 (three) times daily with meals. Sliding Scale Insulin  . LANTUS SOLOSTAR 100 UNIT/ML Solostar Pen Inject 26 Units into the skin at bedtime.  [name] loratadine (CLARITIN) 10 MG tablet Take 10 mg by mouth daily.   [name] losartan (COZAAR) 25 MG tablet Take 25 mg by mouth daily.  . Mesalamine (ASACOL HD) 800 MG TBEC Take 2 tablets (1,600 mg total) by mouth in the morning, at noon, and at bedtime.  . mesalamine (CANASA) 1000 MG suppository Place 1 suppository (1,000 mg total) rectally at bedtime. Take 3x/week.  . Multiple Vitamin (MULTIVITAMIN ADULT PO) Take 1 tablet by mouth daily.  [name] omeprazole (PRILOSEC) 20 MG capsule Take 40 mg by mouth 2  (two) times daily before a meal. Before breakfast & supper  . traZODone (DESYREL) 50 MG tablet Take 50 mg by mouth at bedtime.   . [DISCONTINUED] ARIPiprazole (ABILIFY) 10 MG tablet Take 10 mg by mouth at bedtime.    Facility-Administered Encounter Medications as of 12/12/2019  Medication  .  0.9 %  sodium chloride infusion  . 0.9 %  sodium chloride infusion    ALLERGIES:  Allergies  Allergen Reactions  . Ace Inhibitors Itching  . Peanut Oil Itching  . Shellfish Allergy Itching and Rash  . Statins Itching  . Adhesive [Tape] Rash  . Pravastatin Sodium Itching     PHYSICAL EXAM:  ECOG Performance status: 1  There were no vitals filed for this visit. There were no vitals filed for this visit. Physical Exam Constitutional:      Appearance: Normal appearance. She is normal weight.  Cardiovascular:     Rate and Rhythm: Normal rate and regular rhythm.     Heart sounds: Normal heart sounds.  Pulmonary:     Effort: Pulmonary effort is normal.     Breath sounds: Normal breath sounds.  Chest:     Chest wall: No mass or swelling.     Comments: Normal breast exam.  Has intermittent pain to right breast.  No signs of lymphedema. Abdominal:     General: Bowel sounds are normal.     Palpations: Abdomen is soft.  Musculoskeletal:        General: Normal range of motion.  Skin:    General: Skin is warm.  Neurological:     Mental Status: She is alert and oriented to person, place, and time. Mental status is at baseline.  Psychiatric:        Mood and Affect: Mood normal.        Behavior: Behavior normal.        Thought Content: Thought content normal.        Judgment: Judgment normal.      LABORATORY DATA:  I have reviewed the labs as listed.  CBC    Component Value Date/Time   WBC 4.8 12/06/2019 1207   RBC 4.78 12/06/2019 1207   HGB 13.1 12/06/2019 1207   HCT 42.1 12/06/2019 1207   PLT 125 (L) 12/06/2019 1207   MCV 88.1 12/06/2019 1207   MCH 27.4 12/06/2019 1207   MCHC  31.1 12/06/2019 1207   RDW 13.2 12/06/2019 1207   LYMPHSABS 0.9 12/06/2019 1207   MONOABS 0.4 12/06/2019 1207   EOSABS 0.0 12/06/2019 1207   BASOSABS 0.0 12/06/2019 1207   CMP Latest Ref Rng & Units 12/06/2019 12/02/2019 08/29/2019  Glucose 70 - 99 mg/dL 87 220(H) 337(H)  BUN 6 - 20 mg/dL 9 9 10   Creatinine 0.44 - 1.00 mg/dL 0.52 0.58 0.68  Sodium 135 - 145 mmol/L 140 143 142  Potassium 3.5 - 5.1 mmol/L 3.8 4.1 4.2  Chloride 98 - 111 mmol/L 103 104 104  CO2 22 - 32 mmol/L 29 33(H) 32  Calcium 8.9 - 10.3 mg/dL 9.4 9.5 9.0  Total Protein 6.5 - 8.1 g/dL 7.0 6.6 6.1  Total Bilirubin 0.3 - 1.2 mg/dL 1.1 0.7 0.9  Alkaline Phos 38 - 126 U/L 102 - -  AST 15 - 41 U/L 22 21 21   ALT 0 - 44 U/L 28 26 23     All questions were answered to patient's stated satisfaction. Encouraged patient to call with any new concerns or questions before his next visit to the [name] center and we can certain see him sooner, if needed.     ASSESSMENT & PLAN:  1.  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 -Status post adjuvant chemotherapy with dose dense AC followed by 3 cycles of  dose dense docetaxel from 06/25/2015-11/12/2015. -Physical examination today is within normal limits lumpectomy scar in the right breast upper outer quadrant is stable.  No palpable masses. -Last mammogram was dated 06/18/19 which was BI-RADS Category 2 benign. -Repeat mammogram in June 2022.  Orders placed. -Labs on  12/06/2019 were unremarkable. -She will follow-up in 6 months with repeat labs and to review mammogram.  2.  Iron deficiency state: -This is from chronic blood loss from ulcerative colitis. -Last Feraheme was on 06/13/2019 and 06/20/2019. -She occasionally has episodes of bleeding per rectum secondary to ulcerative colitis. -Labs on  12/06/2019 shows hemoglobin 13.1,Ferritin 292 and iron saturation was 13%. -Her fatigue is  likely multifactorial.  Lab work today is stable.  Ferritin may be slightly elevated secondary to active inflammation from UC.  She will call if rectal bleeding and fatigue worsens. -We will hold off on additional IV iron at this time. -We will have her return to clinic in 3 months with lab work only and again in 6 months with labs and MD assessment.  3.  B12 deficiency: -Labs on  12/06/2019 showed vitamin B12 level 716. -She is taking vitamin B12 1 mg daily.  4.  Ulcerative colitis: -She had intermittent rectal bleeding secondary to ulcerative colitis. -She is followed closely by GI. -She currently on oral mesalamine and Canasa suppositories 3 times a week. -Fecal calprotectin continues to be elevated indicating inflammation but has improved.  5.Depression: -She is seeing a therapist every 3 months. -Stable on Celexa. -She will talk to him about switching medications if this does not work for her. -Her PCP also placed her on Abilify at nighttime.  6.  Insomnia: -Has tried melatonin and trazodone. -She is currently not taking either. -Have recommended restarting melatonin. -If symptoms do not improve, recommend speaking with her PCP.  7.  Type 1 diabetes: -Currently on sliding scale insulin and Lantus. -Followed closely by endocrinology. -Current A1c 8.1  Disposition: -RTC in 3 months with lab work only (CBC, ferritin and iron panel). -RTC in 6 months for mammogram, labs (CBC, ferritin, iron, vitamin B12, vitamin D and CMP) and MD assessment.  No problem-specific Assessment & Plan notes found for this encounter.  Greater than 50% was spent in counseling and coordination of care with this patient including but not limited to discussion of the relevant topics above (See A&P) including, but not limited to diagnosis and management of acute and chronic medical conditions.   Orders placed this encounter:  Orders Placed This Encounter  Procedures  . MM DIAG BREAST TOMO BILATERAL    Faythe Casa, NP 12/12/2019 9:38 AM  [place] [phone]

## 2019-12-13 ENCOUNTER — Ambulatory Visit: Payer: Medicaid Other | Attending: Family Medicine | Admitting: Neurology

## 2019-12-13 ENCOUNTER — Ambulatory Visit (HOSPITAL_COMMUNITY): Payer: Medicaid Other | Admitting: Oncology

## 2019-12-13 DIAGNOSIS — R400 Somnolence: Secondary | ICD-10-CM | POA: Diagnosis not present

## 2019-12-13 DIAGNOSIS — Z79899 Other long term (current) drug therapy: Secondary | ICD-10-CM | POA: Insufficient documentation

## 2019-12-13 DIAGNOSIS — G4710 Hypersomnia, unspecified: Secondary | ICD-10-CM

## 2019-12-13 DIAGNOSIS — R0683 Snoring: Secondary | ICD-10-CM | POA: Insufficient documentation

## 2019-12-13 DIAGNOSIS — R5383 Other fatigue: Secondary | ICD-10-CM | POA: Diagnosis not present

## 2019-12-19 NOTE — Procedures (Signed)
Patient Name: Brittney [name], Brittney [name] Date: 12/13/2019 Gender: Female D.O.B: 1970-02-25 Age (years): 49 Referring Provider: [name] Height (inches): 61 Interpreting Physician: Phillips Odor MD, ABSM Weight (lbs): 182 RPSGT: Peak, Robert BMI: 34 MRN: [id number] Neck Size: 14.00 CLINICAL INFORMATION Sleep Study Type: NPSG     Indication for sleep study: N/A     Epworth Sleepiness Score: 6     SLEEP STUDY TECHNIQUE As per the AASM Manual for the Scoring of Sleep and Associated Events v2.3 (April 2016) with a hypopnea requiring 4% desaturations.  The channels recorded and monitored were frontal, central and occipital EEG, electrooculogram (EOG), submentalis EMG (chin), nasal and oral airflow, thoracic and abdominal wall motion, anterior tibialis EMG, snore microphone, electrocardiogram, and pulse oximetry.  MEDICATIONS Medications self-administered by patient taken the night of the study : N/A  Current Outpatient Medications:  .  ACCU-CHEK AVIVA PLUS test strip, SMARTSIG:Via Meter 6 Times Daily, Disp: , Rfl:  .  acetaminophen (TYLENOL) 500 MG tablet, Take 1,000 mg by mouth every 6 (six) hours as needed. For pain, Disp: , Rfl:  .  acyclovir (ZOVIRAX) 200 MG capsule, Take 400 mg by mouth daily. Reported on 07/23/2015, Disp: , Rfl:  .  albuterol (PROVENTIL HFA;VENTOLIN HFA) 108 (90 Base) MCG/ACT inhaler, Inhale 2 puffs into the lungs every 4 (four) hours as needed for wheezing or shortness of breath. , Disp: , Rfl:  .  ARIPiprazole (ABILIFY) 30 MG tablet, SMARTSIG:1 Tablet(s) By Mouth Every Evening, Disp: , Rfl:  .  atorvastatin (LIPITOR) 20 MG tablet, Take 20 mg by mouth daily. , Disp: , Rfl:  .  Continuous Blood Gluc Sensor (DEXCOM G6 SENSOR) MISC, SMARTSIG:1 Each Topical Every 10 Days, Disp: , Rfl:  .  Continuous Blood Gluc Transmit (DEXCOM G6 TRANSMITTER) MISC, , Disp: , Rfl:  .  diclofenac Sodium (VOLTAREN) 1 % GEL, Apply 1 application topically 3 (three) times  daily., Disp: , Rfl:  .  DULAGLUTIDE [place], Inject into the skin., Disp: , Rfl:  .  gabapentin (NEURONTIN) 300 MG capsule, Take 300-600 mg by mouth See admin instructions. Take 1 capsule (300 mg) by mouth in the morning & take 2 capsules (600 mg) by mouth at night, Disp: , Rfl: 6 .  HUMALOG 100 UNIT/ML injection, Inject 9-12 Units into the skin 3 (three) times daily with meals. Sliding Scale Insulin, Disp: , Rfl: 6 .  LANTUS SOLOSTAR 100 UNIT/ML Solostar Pen, Inject 26 Units into the skin at bedtime., Disp: , Rfl: 6 .  loratadine (CLARITIN) 10 MG tablet, Take 10 mg by mouth daily. , Disp: , Rfl: 6 .  losartan (COZAAR) 25 MG tablet, Take 25 mg by mouth daily., Disp: , Rfl:  .  Mesalamine (ASACOL HD) 800 MG TBEC, Take 2 tablets (1,600 mg total) by mouth in the morning, at noon, and at bedtime., Disp: 180 tablet, Rfl: 6 .  mesalamine (CANASA) 1000 MG suppository, Place 1 suppository (1,000 mg total) rectally at bedtime. Take 3x/week., Disp: 30 suppository, Rfl: 12 .  Multiple Vitamin (MULTIVITAMIN ADULT PO), Take 1 tablet by mouth daily., Disp: , Rfl:  .  omeprazole (PRILOSEC) 20 MG capsule, Take 40 mg by mouth 2 (two) times daily before a meal. Before breakfast & supper, Disp: , Rfl:  .  traZODone (DESYREL) 50 MG tablet, Take 50 mg by mouth at bedtime. , Disp: , Rfl:  No current facility-administered medications for this visit.  Facility-Administered Medications Ordered in Other Visits:  .  0.9 %  sodium chloride  infusion, , Intravenous, Continuous, Kefalas, Manon Hilding, PA-C .  0.9 %  sodium chloride infusion, , Intravenous, Continuous, Higgs, Vetta, MD, Stopped at 05/26/17 1510     SLEEP ARCHITECTURE The study was initiated at 9:20:49 PM and ended at 4:53:47 AM.  Sleep onset time was 58.2 minutes and the sleep efficiency was 76.6%. The total sleep time was 347 minutes.  Stage REM latency was 126.0 minutes.  The patient spent 2.16% of the night in stage N1 sleep, 77.09% in stage N2 sleep, 0.14%  in stage N3 and 20.6% in REM.  Alpha intrusion was absent.  Supine sleep was 100.00%.  RESPIRATORY PARAMETERS The overall apnea/hypopnea index (AHI) was 2.9 per hour. There were 5 total apneas, including 3 obstructive, 1 central and 1 mixed apneas. There were 12 hypopneas and 0 RERAs.  The AHI during Stage REM sleep was 11.7 per hour.  AHI while supine was 2.9 per hour.  The mean oxygen saturation was 95.69%. The minimum SpO2 during sleep was 86.00%.  moderate snoring was noted during this study.  CARDIAC DATA The 2 lead EKG demonstrated sinus rhythm. The mean heart rate was 74.07 beats per minute. Other EKG findings include: None.  LEG MOVEMENT DATA The total PLMS were 0 with a resulting PLMS index of 0.00. Associated arousal with leg movement index was 0.0.  IMPRESSIONS No significant obstructive sleep apnea occurred during this study. No significant central sleep apnea occurred during this study    ELECTRONICALLY SIGNED ON:  12/19/2019, 10:06 AM [place] PH: [phone]   FX: [phone] [place]

## 2020-01-15 ENCOUNTER — Encounter (INDEPENDENT_AMBULATORY_CARE_PROVIDER_SITE_OTHER): Payer: Self-pay | Admitting: Gastroenterology

## 2020-01-15 ENCOUNTER — Other Ambulatory Visit: Payer: Self-pay

## 2020-01-15 ENCOUNTER — Ambulatory Visit (INDEPENDENT_AMBULATORY_CARE_PROVIDER_SITE_OTHER): Payer: Medicaid Other | Admitting: Gastroenterology

## 2020-01-15 VITALS — BP 125/81 | HR 75 | Temp 98.1°F | Ht 61.0 in | Wt 183.1 lb

## 2020-01-15 DIAGNOSIS — K5130 Ulcerative (chronic) rectosigmoiditis without complications: Secondary | ICD-10-CM | POA: Diagnosis not present

## 2020-01-15 MED ORDER — MESALAMINE 1000 MG RE SUPP: 1000.0000 mg | Freq: Every day | RECTAL | 5 refills | Status: DC

## 2020-01-15 NOTE — Patient Instructions (Addendum)
Continue Asacol 4.2 g qday Continue Canasa suppositories every day for one more month, then go to every other day if symptoms are better Discuss with PCP pneumonia and shingles vaccine, also discuss Pap smear

## 2020-01-15 NOTE — Progress Notes (Signed)
Brittney [name], M.D. Gastroenterology & Hepatology [facility] [address] [place], [place] [address]  Primary Care Physician: [name], MD [place] [address]  I will communicate my assessment and recommendations to the referring MD via EMR.  Problems: 1. 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 female with PMHx of breast cancer diagnosed 2017, IDA, B12 deficiency, UC proctosigmoiditis, who presents for follow-up of ulcerative colitis.  The patient was last seen on 09/19/2019. At that time, the patient was counseled to continue Asacol 4.2 g every day but also to start Canasa 3 times per week.  Notably, the patient called the office on 11/22/2019 she was presenting recurrent rectal bleeding and diarrhea with mucus in her stool and bloating.  I ordered CBC, CMP, C. difficile and GI pathogen panel which were unremarkable.  However, her calprotectin was mildly elevated (292).  The patient was advised to start taking the Canasa every day and continuing her Asacol.  Patient states that she is feeling better right now.  In fact, she reports "this is the best she has felt in multiple years".  Endorses having some lower abdominal pain occasionally described as cramping. Has 1-2 BMs per day, usually soft in consistency. Had one episode of rectal bleeding with some mucus today but this has much more improved. The patient denies having any nausea, vomiting, fever, chills, melena, hematemesis, abdominal distention, \ diarrhea, jaundice, pruritus or weight loss.  Last colonoscopy was performed 12/05/2018, she was found to have an area of mild congestion in the distal sigmoid colon, biopsies were taken.  There was also presence of inflammation [facility] 2 in the rectum.  Biopsies were taken.  Biopsies showed active chronic colitis but no presence of dysplasia.  Last flu shot: October  2021  Last pneumonia shot: never Last Pap smear: not this year Last evaluation by dermatology: never Last zoster vaccine: never COVID-19 shot: Yes, also got booster  Past Medical History: Past Medical History:  Diagnosis Date  . Breast cancer ([place])   . Breast cancer of upper-outer quadrant of right female breast ([place]) 06/11/2015   Triple negative, 2 cm   . Chemotherapy induced neutropenia ([place]) 09/10/2015  . Diabetes mellitus ([place])    Type 1 over 15 yrs  . Environmental allergies   . GERD (gastroesophageal reflux disease)   . Iron deficiency anemia due to chronic blood loss 06/12/2015  . Personal history of chemotherapy   . Personal history of radiation therapy   . 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) [facility])     Past Surgical History: Past Surgical History:  Procedure Laterality Date  . BIOPSY  12/05/2018   Procedure: BIOPSY;  Surgeon: Rogene Houston, MD;  [facility];  Service: Endoscopy;;  duodenum gastric colon  . BREAST LUMPECTOMY Right 2017  . CESAREAN SECTION    . COLONOSCOPY N/A 10/09/2013   Procedure: COLONOSCOPY;  Surgeon: Rogene Houston, MD;  [facility];  Service: Endoscopy;  Laterality: N/A;  100  . COLONOSCOPY N/A 12/05/2018   Procedure: COLONOSCOPY;  Surgeon: Rogene Houston, MD;  [facility];  Service: Endoscopy;  Laterality: N/A;  . DILATION AND CURETTAGE OF UTERUS     x 2 for miscarriages  . ESOPHAGOGASTRODUODENOSCOPY    . ESOPHAGOGASTRODUODENOSCOPY N/A 12/05/2018   Procedure: ESOPHAGOGASTRODUODENOSCOPY (EGD);  Surgeon: Rogene Houston, MD;  [facility];  Service: Endoscopy;  Laterality: N/A;  200pm  . FLEXIBLE SIGMOIDOSCOPY    . FLEXIBLE SIGMOIDOSCOPY  07/27/2011   Procedure: FLEXIBLE SIGMOIDOSCOPY;  Surgeon: Rogene Houston, MD;  [facility];  Service: Endoscopy;  Laterality: N/A;  100    Family History: Family History  Problem Relation Age of Onset  . Non-Hodgkin's lymphoma Mother   .  Hypertension Mother   . Diabetes Mellitus II Mother   . Hypertension Father   . Diabetes Mellitus II Father   . Breast cancer Cousin   . Breast cancer Maternal Aunt   . Colon cancer Maternal Uncle     Social History: Social History   Tobacco Use  Smoking Status Never Smoker  Smokeless Tobacco Never Used   Social History   Substance and Sexual Activity  Alcohol Use No  . Alcohol/week: 0.0 standard drinks   Social History   Substance and Sexual Activity  Drug Use No    Allergies: Allergies  Allergen Reactions  . Ace Inhibitors Itching  . Peanut Oil Itching  . Shellfish Allergy Itching and Rash  . Statins Itching  . Adhesive [Tape] Rash  . Pravastatin Sodium Itching    Medications: Current Outpatient Medications  Medication Sig Dispense Refill  . ACCU-CHEK AVIVA PLUS test strip SMARTSIG:Via Meter 6 Times Daily    . acetaminophen (TYLENOL) 500 MG tablet Take 1,000 mg by mouth every 6 (six) hours as needed. For pain    . acyclovir (ZOVIRAX) 200 MG capsule Take 400 mg by mouth daily. Reported on 07/23/2015    . albuterol (PROVENTIL HFA;VENTOLIN HFA) 108 (90 Base) MCG/ACT inhaler Inhale 2 puffs into the lungs every 4 (four) hours as needed for wheezing or shortness of breath.     . ARIPiprazole (ABILIFY) 30 MG tablet SMARTSIG:1 Tablet(s) By Mouth Every Evening    . atorvastatin (LIPITOR) 20 MG tablet Take 20 mg by mouth daily.     . Continuous Blood Gluc Sensor (DEXCOM G6 SENSOR) MISC SMARTSIG:1 Each Topical Every 10 Days    . Continuous Blood Gluc Transmit (DEXCOM G6 TRANSMITTER) MISC     . Desvenlafaxine Succinate ER 25 MG TB24 Take 25 mg by mouth daily.    . diclofenac Sodium (VOLTAREN) 1 % GEL Apply 1 application topically 3 (three) times daily.    [name] gabapentin (NEURONTIN) 300 MG capsule Take 300-600 mg by mouth See admin instructions. Take 1 capsule (300 mg) by mouth in the morning & take 2 capsules (600 mg) by mouth at night  6  . HUMALOG 100 UNIT/ML injection  Inject 9-12 Units into the skin 3 (three) times daily with meals. Sliding Scale Insulin  6  . LANTUS SOLOSTAR 100 UNIT/ML Solostar Pen Inject 26 Units into the skin at bedtime.  6  . loratadine (CLARITIN) 10 MG tablet Take 10 mg by mouth daily.   6  . losartan (COZAAR) 25 MG tablet Take 25 mg by mouth daily.    . Mesalamine (ASACOL HD) 800 MG TBEC Take 2 tablets (1,600 mg total) by mouth in the morning, at noon, and at bedtime. 180 tablet 6  . omeprazole (PRILOSEC) 20 MG capsule Take 40 mg by mouth 2 (two) times daily before a meal. Before breakfast & supper    . traZODone (DESYREL) 50 MG tablet Take 50 mg by mouth at bedtime.     [name] VITAMIN D PO Take by mouth daily.    [name] DULAGLUTIDE [place] Inject into the skin. (Patient not taking: Reported on 01/15/2020)    .  mesalamine (CANASA) 1000 MG suppository Place 1 suppository (1,000 mg total) rectally at bedtime. Take 3x/week. 30 suppository 5  . Multiple Vitamin (MULTIVITAMIN ADULT PO) Take 1 tablet by mouth daily. (Patient not taking: Reported on 01/15/2020)     No current facility-administered medications for this visit.   Facility-Administered Medications Ordered in Other Visits  Medication Dose Route Frequency Provider Last Rate Last Admin  . 0.9 %  sodium chloride infusion   Intravenous Continuous Kefalas, Manon Hilding, PA-C      . 0.9 %  sodium chloride infusion   Intravenous Continuous Higgs, Vetta, MD   Stopped at 05/26/17 1510    Review of Systems: GENERAL: negative for malaise, night sweats HEENT: No changes in hearing or vision, no nose bleeds or other nasal problems. NECK: Negative for lumps, goiter, pain and significant neck swelling RESPIRATORY: Negative for cough, wheezing CARDIOVASCULAR: Negative for chest pain, leg swelling, palpitations, orthopnea GI: SEE HPI MUSCULOSKELETAL: Negative for joint pain or swelling, back pain, and muscle pain. SKIN: Negative for lesions, rash PSYCH: Negative for sleep disturbance, mood disorder and recent  psychosocial stressors. HEMATOLOGY Negative for prolonged bleeding, bruising easily, and swollen nodes. ENDOCRINE: Negative for cold or heat intolerance, polyuria, polydipsia and goiter. NEURO: negative for tremor, gait imbalance, syncope and seizures. The remainder of the review of systems is noncontributory.   Physical Exam: BP 125/81 (BP Location: Left Arm, Patient Position: Sitting, Cuff Size: Large)   Pulse 75   Temp 98.1 F (36.7 C) (Oral)   Ht 5' 1"  (1.549 m)   Wt 183 lb 1.9 oz (83.1 kg)   LMP 09/04/2015 (Approximate)   BMI 34.60 kg/m  GENERAL: The patient is AO x3, in no acute distress. HEENT: Head is normocephalic and atraumatic. EOMI are intact. Mouth is well hydrated and without lesions. NECK: Supple. No masses LUNGS: Clear to auscultation. No presence of rhonchi/wheezing/rales. Adequate chest expansion HEART: RRR, normal s1 and s2. ABDOMEN: mildy tender to palpation of the hypogastric area, no guarding, no peritoneal signs, and nondistended. BS +. No masses. EXTREMITIES: Without any cyanosis, clubbing, rash, lesions or edema. NEUROLOGIC: AOx3, no focal motor deficit. SKIN: no jaundice, no rashes  Imaging/Labs: as above  I personally reviewed and interpreted the available labs, imaging and endoscopic files.  Impression and Plan: Brittney [name] is a 50 y.o. female with PMHx of breast cancer diagnosed 2017, IDA, B12 deficiency, UC proctosigmoiditis, who presents for follow-up of ulcerative colitis.  Patient has had improvement of her symptoms while taking mesalamine components both orally and topically on a daily basis.  The patient states that she would like to take the suppository less frequently.  I explained to her that she will need to take it for at least 1 more month on a daily basis and afterwards she can continue every other day.  If she has clinical remission at that time, there is no need to escalate her treatment.  Nevertheless, if she wants to try any other  medication, I do consider Entyvio, Stelara or ozanimod feasible options due to their low risk of malignancy, as this is a major concern for the patient if other therapies are tried (not interested in IMM due to this).  Finally, I counseled the patient about asking her PCP for a pneumonia and zoster vaccination, as well as following with her for dermatology referral and routine Pap smear.  - Continue Asacol 4.2 g qday - Continue Canasa suppositories every day for one more month, then go to every other day  if symptoms are better - Discuss with PCP pneumonia and shingles vaccine, also discuss Pap smear - RTC 3 months  All questions were answered.      Harvel Quale, MD Gastroenterology and Hepatology [facility]

## 2020-01-16 ENCOUNTER — Telehealth (INDEPENDENT_AMBULATORY_CARE_PROVIDER_SITE_OTHER): Payer: Self-pay | Admitting: *Deleted

## 2020-01-16 NOTE — Telephone Encounter (Signed)
Called pharmacy, I clarified the Canasa should be used daily.

## 2020-01-16 NOTE — Telephone Encounter (Signed)
[facility] called - they need clarification on dosage for her suppository please call [phone]

## 2020-01-28 ENCOUNTER — Other Ambulatory Visit (INDEPENDENT_AMBULATORY_CARE_PROVIDER_SITE_OTHER): Payer: Self-pay

## 2020-01-28 DIAGNOSIS — K51919 Ulcerative colitis, unspecified with unspecified complications: Secondary | ICD-10-CM

## 2020-01-28 DIAGNOSIS — K5190 Ulcerative colitis, unspecified, without complications: Secondary | ICD-10-CM

## 2020-01-28 DIAGNOSIS — K5130 Ulcerative (chronic) rectosigmoiditis without complications: Secondary | ICD-10-CM

## 2020-01-28 DIAGNOSIS — K5120 Ulcerative (chronic) proctitis without complications: Secondary | ICD-10-CM

## 2020-01-28 MED ORDER — MESALAMINE 800 MG PO TBEC: 1600.0000 mg | DELAYED_RELEASE_TABLET | Freq: Three times a day (TID) | ORAL | 3 refills | Status: DC

## 2020-03-10 ENCOUNTER — Other Ambulatory Visit (HOSPITAL_COMMUNITY): Payer: Self-pay

## 2020-03-10 DIAGNOSIS — D500 Iron deficiency anemia secondary to blood loss (chronic): Secondary | ICD-10-CM

## 2020-03-11 ENCOUNTER — Inpatient Hospital Stay (HOSPITAL_COMMUNITY): Payer: Medicaid Other | Attending: Hematology

## 2020-03-11 ENCOUNTER — Other Ambulatory Visit: Payer: Self-pay

## 2020-03-11 DIAGNOSIS — D509 Iron deficiency anemia, unspecified: Secondary | ICD-10-CM | POA: Insufficient documentation

## 2020-03-11 DIAGNOSIS — Z853 Personal history of malignant neoplasm of breast: Secondary | ICD-10-CM | POA: Diagnosis present

## 2020-03-11 DIAGNOSIS — E538 Deficiency of other specified B group vitamins: Secondary | ICD-10-CM | POA: Insufficient documentation

## 2020-03-11 DIAGNOSIS — D500 Iron deficiency anemia secondary to blood loss (chronic): Secondary | ICD-10-CM

## 2020-03-11 LAB — CBC WITH DIFFERENTIAL/PLATELET
Abs Immature Granulocytes: 0.01 10*3/uL (ref 0.00–0.07)
Basophils Absolute: 0 10*3/uL (ref 0.0–0.1)
Basophils Relative: 0 %
Eosinophils Absolute: 0 10*3/uL (ref 0.0–0.5)
Eosinophils Relative: 0 %
HCT: 41.4 % (ref 36.0–46.0)
Hemoglobin: 12.8 g/dL (ref 12.0–15.0)
Immature Granulocytes: 0 %
Lymphocytes Relative: 29 %
Lymphs Abs: 1.4 10*3/uL (ref 0.7–4.0)
MCH: 27.4 pg (ref 26.0–34.0)
MCHC: 30.9 g/dL (ref 30.0–36.0)
MCV: 88.5 fL (ref 80.0–100.0)
Monocytes Absolute: 0.3 10*3/uL (ref 0.1–1.0)
Monocytes Relative: 6 %
Neutro Abs: 3.1 10*3/uL (ref 1.7–7.7)
Neutrophils Relative %: 65 %
Platelets: 168 10*3/uL (ref 150–400)
RBC: 4.68 MIL/uL (ref 3.87–5.11)
RDW: 13.3 % (ref 11.5–15.5)
WBC: 4.8 10*3/uL (ref 4.0–10.5)
nRBC: 0 % (ref 0.0–0.2)

## 2020-03-11 LAB — IRON AND TIBC
Iron: 104 ug/dL (ref 28–170)
Saturation Ratios: 29 % (ref 10.4–31.8)
TIBC: 360 ug/dL (ref 250–450)
UIBC: 256 ug/dL

## 2020-03-11 LAB — FERRITIN: Ferritin: 229 ng/mL (ref 11–307)

## 2020-03-17 ENCOUNTER — Encounter (INDEPENDENT_AMBULATORY_CARE_PROVIDER_SITE_OTHER): Payer: Self-pay | Admitting: Internal Medicine

## 2020-03-17 ENCOUNTER — Ambulatory Visit (INDEPENDENT_AMBULATORY_CARE_PROVIDER_SITE_OTHER): Payer: Medicaid Other | Admitting: Internal Medicine

## 2020-03-17 ENCOUNTER — Other Ambulatory Visit: Payer: Self-pay

## 2020-03-17 VITALS — BP 130/81 | HR 78 | Temp 98.7°F | Ht 61.0 in | Wt 180.6 lb

## 2020-03-17 DIAGNOSIS — K219 Gastro-esophageal reflux disease without esophagitis: Secondary | ICD-10-CM

## 2020-03-17 MED ORDER — OMEPRAZOLE 20 MG PO CPDR: 20.0000 mg | DELAYED_RELEASE_CAPSULE | Freq: Two times a day (BID) | ORAL | 5 refills | Status: DC

## 2020-03-17 NOTE — Patient Instructions (Addendum)
Physician will call with results of stool test when completed. Can take Pepcid OTC 20 mg at bedtime on as-needed basis. Please call office if omeprazole at 20 mg twice daily does not work

## 2020-03-19 NOTE — Progress Notes (Signed)
Presenting complaint;  Follow-up for ulcerative colitis and GERD.  Database and subjective:  Patient is 50 year old African-American female who has history of distal ulcerative colitis dating back to 1997 as well as history of iron deficiency anemia for which she was evaluated in December 2020 with EGD and colonoscopy and she also has chronic GERD. She was having diarrhea and rectal bleeding in November 2021.  Stool C. difficile testing was negative.  Fecal calprotectin was elevated.  She was advised to continue is a call at a high dose and advised to use Canasa suppositories 3 times a week. She was seen by Dr. Jenetta Downer on 01/15/2020.  She was still having some rectal bleeding.  She is advised to continue his occulted current dose and he was Canasa suppository daily for 1 month and thereafter every other day.  She now returns for follow-up visit.  She says she is feeling better.  She is having 2-3 stools per day.  Stool consistency varies between formed and soft stools but every now and then she has loose stools.  She denies abdominal pain nausea or vomiting.  She feels heartburns well controlled with double dose PPI.  She still has urgency with accidents.  She has not passed any blood per rectum in the last 2 months.  Her appetite is normal.  She has lost 3 pounds since her last visit. She says she has been using Dexcom for 1 year.  She says her hemoglobin A1c was above 8.  She is still having episodes of low blood glucose. She does not take OTC NSAIDs.  She is using Voltaren gel for pain in her right hand.   Current Medications: Outpatient Encounter Medications as of 03/17/2020  Medication Sig  . ACCU-CHEK AVIVA PLUS test strip SMARTSIG:Via Meter 6 Times Daily  . acetaminophen (TYLENOL) 500 MG tablet Take 1,000 mg by mouth every 6 (six) hours as needed. For pain  . acyclovir (ZOVIRAX) 200 MG capsule Take 400 mg by mouth daily. Reported on 07/23/2015  . albuterol (PROVENTIL HFA;VENTOLIN HFA) 108  (90 Base) MCG/ACT inhaler Inhale 2 puffs into the lungs every 4 (four) hours as needed for wheezing or shortness of breath.   . ARIPiprazole (ABILIFY) 30 MG tablet Take 30 mg by mouth in the morning.  [name] atorvastatin (LIPITOR) 20 MG tablet Take 20 mg by mouth daily.   . Continuous Blood Gluc Sensor (DEXCOM G6 SENSOR) MISC SMARTSIG:1 Each Topical Every 10 Days  . Continuous Blood Gluc Transmit (DEXCOM G6 TRANSMITTER) MISC   . Desvenlafaxine Succinate ER 25 MG TB24 Take 25 mg by mouth daily.  . diclofenac Sodium (VOLTAREN) 1 % GEL Apply 1 application topically 3 (three) times daily.  [name] gabapentin (NEURONTIN) 300 MG capsule Take 300-600 mg by mouth See admin instructions. Take 1 capsule (300 mg) by mouth in the morning & take 2 capsules (600 mg) by mouth at night  . HUMALOG 100 UNIT/ML injection Inject 9-12 Units into the skin 3 (three) times daily with meals. Sliding Scale Insulin  . LANTUS SOLOSTAR 100 UNIT/ML Solostar Pen Inject 26 Units into the skin at bedtime.  [name] loratadine (CLARITIN) 10 MG tablet Take 10 mg by mouth daily.   [name] losartan (COZAAR) 25 MG tablet Take 25 mg by mouth daily.  . Mesalamine (ASACOL HD) 800 MG TBEC Take 2 tablets (1,600 mg total) by mouth in the morning, at noon, and at bedtime.  . mesalamine (CANASA) 1000 MG suppository Place 1 suppository (1,000 mg total) rectally at bedtime. Take  3x/week.  [name] omeprazole (PRILOSEC) 20 MG capsule Take 1 capsule (20 mg total) by mouth 2 (two) times daily before a meal.  . traZODone (DESYREL) 50 MG tablet Take 50 mg by mouth at bedtime.   [name] VITAMIN D PO Take by mouth daily.  . [DISCONTINUED] omeprazole (PRILOSEC) 40 MG capsule Take 40 mg by mouth 2 (two) times daily before a meal. Before breakfast & supper  . Multiple Vitamin (MULTIVITAMIN ADULT PO) Take 1 tablet by mouth daily. (Patient not taking: No sig reported)  . [DISCONTINUED] DULAGLUTIDE [place] Inject into the skin. (Patient not taking: No sig reported)   Facility-Administered  Encounter Medications as of 03/17/2020  Medication  . 0.9 %  sodium chloride infusion  . 0.9 %  sodium chloride infusion     Objective: Blood pressure 130/81, pulse 78, temperature 98.7 F (37.1 C), temperature source Oral, height 5' 1"  (1.549 m), weight 180 lb 9.6 oz (81.9 kg), last menstrual period 09/04/2015. Patient is alert and in no acute distress. She is wearing a mask. Conjunctiva is pink. Sclera is nonicteric Oropharyngeal mucosa is normal. No neck masses or thyromegaly noted. Cardiac exam with regular rhythm normal S1 and S2. No murmur or gallop noted. Lungs are clear to auscultation. Abdomen is full.  On palpation abdomen is soft with mild tenderness in right lower quadrant where percussion note is very tympanic.  No organomegaly or masses. No LE edema or clubbing noted.  Labs/studies Results:  CBC Latest Ref Rng & Units 03/11/2020 12/06/2019 12/02/2019  WBC 4.0 - 10.5 K/uL 4.8 4.8 4.3  Hemoglobin 12.0 - 15.0 g/dL 12.8 13.1 13.1  Hematocrit 36.0 - 46.0 % 41.4 42.1 40.4  Platelets 150 - 400 K/uL 168 125(L) 160    CMP Latest Ref Rng & Units 12/06/2019 12/02/2019 08/29/2019  Glucose 70 - 99 mg/dL 87 220(H) 337(H)  BUN 6 - 20 mg/dL 9 9 10   Creatinine 0.44 - 1.00 mg/dL 0.52 0.58 0.68  Sodium 135 - 145 mmol/L 140 143 142  Potassium 3.5 - 5.1 mmol/L 3.8 4.1 4.2  Chloride 98 - 111 mmol/L 103 104 104  CO2 22 - 32 mmol/L 29 33(H) 32  Calcium 8.9 - 10.3 mg/dL 9.4 9.5 9.0  Total Protein 6.5 - 8.1 g/dL 7.0 6.6 6.1  Total Bilirubin 0.3 - 1.2 mg/dL 1.1 0.7 0.9  Alkaline Phos 38 - 126 U/L 102 - -  AST 15 - 41 U/L 22 21 21   ALT 0 - 44 U/L 28 26 23     Hepatic Function Latest Ref Rng & Units 12/06/2019 12/02/2019 08/29/2019  Total Protein 6.5 - 8.1 g/dL 7.0 6.6 6.1  Albumin 3.5 - 5.0 g/dL 4.0 - -  AST 15 - 41 U/L 22 21 21   ALT 0 - 44 U/L 28 26 23   Alk Phosphatase 38 - 126 U/L 102 - -  Total Bilirubin 0.3 - 1.2 mg/dL 1.1 0.7 0.9    Lab Results  Component Value Date   CRP 1.4  12/02/2019    Fecal calprotectin was 527 in July 2021 Fecal calprotectin 292 in November 2021.  Assessment:  #1.  Distal ulcerative colitis of 25 years duration.  Last EGD was in December 2020 revealing mildy active disease in distal sigmoid colon and rectum.  While she is not having rectal bleeding she is still having urgency and some loose stools.  It remains to be seen if she is in remission.  She is on fairly high-dose of oral mesalamine.  I doubt escalating the  dose would help.  Next step would be biologic therapy.    #2.  History of iron deficiency anemia most likely due to impaired iron absorption in setting of chronic acid suppression and GI blood loss.  H&H is now normal.    #3.  Chronic GERD.  She is on 80 mg of omeprazole daily.  It is time for [place] to decrease the dose to 40 mg/day and use OTC famotidine at bedside as needed.  Plan:  Continue Canasa suppository 3 times a week. Continue Asacol/oral mesalamine at a total daily dose of 4800 mg. Fecal calprotectin. Decrease omeprazole to 20 mg p.o. twice daily. Can use Pepcid OTC 20 mg at bedtime as needed for breakthrough symptoms. Office visit in 6 months.

## 2020-03-27 LAB — CALPROTECTIN, FECAL: Calprotectin, Fecal: 163 ug/g — ABNORMAL HIGH (ref 0–120)

## 2020-04-16 ENCOUNTER — Telehealth: Payer: Self-pay

## 2020-04-16 DIAGNOSIS — E1065 Type 1 diabetes mellitus with hyperglycemia: Secondary | ICD-10-CM

## 2020-04-16 MED ORDER — DEXCOM G6 SENSOR MISC: 2 refills | Status: DC

## 2020-04-16 NOTE — Telephone Encounter (Signed)
Rx sent 

## 2020-04-16 NOTE — Telephone Encounter (Signed)
Patient said that she needs a Dexcom Sensor refill sent in. She said initially it came through her PCP but she said now it needs to come through our office. Please Advise.    [place]- [phone]

## 2020-04-30 NOTE — Telephone Encounter (Signed)
Patient called and said she has not heard from anyone or received the sensor yet. She was made aware that we sent that in on 4/14. She is going to call them and check status of this and call back if needs anything further

## 2020-05-27 ENCOUNTER — Other Ambulatory Visit (HOSPITAL_COMMUNITY): Payer: Self-pay | Admitting: Oncology

## 2020-05-27 DIAGNOSIS — Z9889 Other specified postprocedural states: Secondary | ICD-10-CM

## 2020-06-04 LAB — COMPREHENSIVE METABOLIC PANEL
ALT: 15 IU/L (ref 0–32)
AST: 17 IU/L (ref 0–40)
Albumin/Globulin Ratio: 2 (ref 1.2–2.2)
Albumin: 4.5 g/dL (ref 3.8–4.8)
Alkaline Phosphatase: 126 IU/L — ABNORMAL HIGH (ref 44–121)
BUN/Creatinine Ratio: 16 (ref 9–23)
BUN: 11 mg/dL (ref 6–24)
Bilirubin Total: 0.6 mg/dL (ref 0.0–1.2)
CO2: 27 mmol/L (ref 20–29)
Calcium: 9.5 mg/dL (ref 8.7–10.2)
Chloride: 106 mmol/L (ref 96–106)
Creatinine, Ser: 0.69 mg/dL (ref 0.57–1.00)
Globulin, Total: 2.2 g/dL (ref 1.5–4.5)
Glucose: 168 mg/dL — ABNORMAL HIGH (ref 65–99)
Potassium: 4.4 mmol/L (ref 3.5–5.2)
Sodium: 145 mmol/L — ABNORMAL HIGH (ref 134–144)
Total Protein: 6.7 g/dL (ref 6.0–8.5)
eGFR: 107 mL/min/{1.73_m2} (ref >59)

## 2020-06-09 ENCOUNTER — Ambulatory Visit (INDEPENDENT_AMBULATORY_CARE_PROVIDER_SITE_OTHER): Payer: Medicaid Other | Admitting: "Endocrinology

## 2020-06-09 ENCOUNTER — Encounter: Payer: Self-pay | Admitting: "Endocrinology

## 2020-06-09 ENCOUNTER — Other Ambulatory Visit: Payer: Self-pay

## 2020-06-09 VITALS — BP 155/93 | HR 66 | Ht 61.0 in | Wt 185.4 lb

## 2020-06-09 DIAGNOSIS — E559 Vitamin D deficiency, unspecified: Secondary | ICD-10-CM

## 2020-06-09 DIAGNOSIS — E1065 Type 1 diabetes mellitus with hyperglycemia: Secondary | ICD-10-CM

## 2020-06-09 DIAGNOSIS — E782 Mixed hyperlipidemia: Secondary | ICD-10-CM | POA: Diagnosis not present

## 2020-06-09 LAB — POCT GLYCOSYLATED HEMOGLOBIN (HGB A1C): HbA1c, POC (controlled diabetic range): 7.9 % — AB (ref 0.0–7.0)

## 2020-06-09 LAB — POCT UA - MICROALBUMIN
Creatinine, POC: 200 mg/dL
Microalbumin Ur, POC: 80 mg/L

## 2020-06-09 LAB — HEMOGLOBIN A1C: Hemoglobin A1C: 7.9

## 2020-06-09 MED ORDER — LOSARTAN POTASSIUM 50 MG PO TABS: 50.0000 mg | ORAL_TABLET | Freq: Every day | ORAL | 1 refills | Status: DC

## 2020-06-09 MED ORDER — DEXCOM G6 SENSOR MISC: 4.0000 | 2 refills | Status: DC

## 2020-06-09 MED ORDER — DEXCOM G6 RECEIVER DEVI: 1.0000 | 0 refills | Status: DC | PRN

## 2020-06-09 MED ORDER — DEXCOM G6 TRANSMITTER MISC: 1.0000 | 1 refills | Status: DC

## 2020-06-09 NOTE — Patient Instructions (Signed)

## 2020-06-09 NOTE — Progress Notes (Signed)
06/09/2020, 9:36 AM     Endocrinology follow-up note  Subjective:    Patient ID: Brittney [name], female    DOB: 18-Jun-1970.  Brittney [name] is being seen in follow-up in the management of currently uncontrolled symptomatic type 1 diabetes, hypertension, hyperlipidemia. PMD: [name], MD.   Past Medical History:  Diagnosis Date  . Breast cancer ([place])   . Breast cancer of upper-outer quadrant of right female breast ([place]) 06/11/2015   Triple negative, 2 cm   . Chemotherapy induced neutropenia ([place]) 09/10/2015  . Diabetes mellitus ([name])    Type 1 over 15 yrs  . Environmental allergies   . GERD (gastroesophageal reflux disease)   . Iron deficiency anemia due to chronic blood loss 06/12/2015  . Personal history of chemotherapy   . Personal history of radiation therapy   . 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) [facility])     Past Surgical History:  Procedure Laterality Date  . BIOPSY  12/05/2018   Procedure: BIOPSY;  Surgeon: Rogene Houston, MD;  [facility];  Service: Endoscopy;;  duodenum gastric colon  . BREAST LUMPECTOMY Right 2017  . CESAREAN SECTION    . COLONOSCOPY N/A 10/09/2013   Procedure: COLONOSCOPY;  Surgeon: Rogene Houston, MD;  [facility];  Service: Endoscopy;  Laterality: N/A;  100  . COLONOSCOPY N/A 12/05/2018   Procedure: COLONOSCOPY;  Surgeon: Rogene Houston, MD;  [facility];  Service: Endoscopy;  Laterality: N/A;  . DILATION AND CURETTAGE OF UTERUS     x 2 for miscarriages  . ESOPHAGOGASTRODUODENOSCOPY    . ESOPHAGOGASTRODUODENOSCOPY N/A 12/05/2018   Procedure: ESOPHAGOGASTRODUODENOSCOPY (EGD);  Surgeon: Rogene Houston, MD;  [facility];  Service: Endoscopy;  Laterality: N/A;  200pm  . FLEXIBLE SIGMOIDOSCOPY    . FLEXIBLE SIGMOIDOSCOPY  07/27/2011   Procedure: FLEXIBLE SIGMOIDOSCOPY;  Surgeon: Rogene Houston,  MD;  [facility];  Service: Endoscopy;  Laterality: N/A;  100    Social History   Socioeconomic History  . Marital status: Single    Spouse name: Not on file  . Number of children: Not on file  . Years of education: Not on file  . Highest education level: Not on file  Occupational History  . Not on file  Tobacco Use  . Smoking status: Never Smoker  . Smokeless tobacco: Never Used  Vaping Use  . Vaping Use: Never used  Substance and Sexual Activity  . Alcohol use: No    Alcohol/week: 0.0 standard drinks  . Drug use: No  . Sexual activity: Not on file  Other Topics Concern  . Not on file  Social History Narrative  . Not on file   Social Determinants of Health   Financial Resource Strain: Low Risk   . Difficulty of Paying Living Expenses: Not hard at all  Food Insecurity: No Food Insecurity  . Worried About Charity fundraiser in the Last Year: Never true  . Ran Out of Food in the Last Year: Never true  Transportation Needs: No Transportation Needs  . Lack  of Transportation (Medical): No  . Lack of Transportation (Non-Medical): No  Physical Activity: Inactive  . Days of Exercise per Week: 0 days  . Minutes of Exercise per Session: 0 min  Stress: No Stress Concern Present  . Feeling of Stress : Not at all  Social Connections: Moderately Integrated  . Frequency of Communication with Friends and Family: More than three times a week  . Frequency of Social Gatherings with Friends and Family: Three times a week  . Attends Religious Services: 1 to 4 times per year  . Active Member of Clubs or Organizations: No  . Attends Archivist Meetings: 1 to 4 times per year  . Marital Status: Never married    Family History  Problem Relation Age of Onset  . Non-Hodgkin's lymphoma Mother   . Hypertension Mother   . Diabetes Mellitus II Mother   . Hypertension Father   . Diabetes Mellitus II Father   . Breast cancer Cousin   . Breast cancer Maternal Aunt    . Colon cancer Maternal Uncle     Outpatient Encounter Medications as of 06/09/2020  Medication Sig  . Continuous Blood Gluc Receiver (DEXCOM G6 RECEIVER) DEVI 1 Piece by Does not apply route as needed.  . Continuous Blood Gluc Sensor (DEXCOM G6 SENSOR) MISC 4 Pieces by Does not apply route once a week.  . Continuous Blood Gluc Transmit (DEXCOM G6 TRANSMITTER) MISC 1 Piece by Does not apply route as directed.  [name] ACCU-CHEK AVIVA PLUS test strip SMARTSIG:Via Meter 6 Times Daily  . acetaminophen (TYLENOL) 500 MG tablet Take 1,000 mg by mouth every 6 (six) hours as needed. For pain  . acyclovir (ZOVIRAX) 200 MG capsule Take 400 mg by mouth daily. Reported on 07/23/2015  . albuterol (PROVENTIL HFA;VENTOLIN HFA) 108 (90 Base) MCG/ACT inhaler Inhale 2 puffs into the lungs every 4 (four) hours as needed for wheezing or shortness of breath.   . ARIPiprazole (ABILIFY) 30 MG tablet Take 30 mg by mouth in the morning.  [name] atorvastatin (LIPITOR) 20 MG tablet Take 20 mg by mouth daily.   . Continuous Blood Gluc Sensor (DEXCOM G6 SENSOR) MISC SMARTSIG:1 Each Topical Every 10 Days  . Continuous Blood Gluc Transmit (DEXCOM G6 TRANSMITTER) MISC   . Desvenlafaxine Succinate ER 25 MG TB24 Take 25 mg by mouth daily.  . diclofenac Sodium (VOLTAREN) 1 % GEL Apply 1 application topically 3 (three) times daily.  [name] gabapentin (NEURONTIN) 300 MG capsule Take 300-600 mg by mouth See admin instructions. Take 1 capsule (300 mg) by mouth in the morning & take 2 capsules (600 mg) by mouth at night  . HUMALOG 100 UNIT/ML injection Inject 9-12 Units into the skin 3 (three) times daily with meals. Sliding Scale Insulin  . LANTUS SOLOSTAR 100 UNIT/ML Solostar Pen Inject 24 Units into the skin at bedtime.  [name] loratadine (CLARITIN) 10 MG tablet Take 10 mg by mouth daily.   [name] losartan (COZAAR) 50 MG tablet Take 1 tablet (50 mg total) by mouth daily.  . Mesalamine (ASACOL HD) 800 MG TBEC Take 2 tablets (1,600 mg total) by mouth in the  morning, at noon, and at bedtime.  . mesalamine (CANASA) 1000 MG suppository Place 1 suppository (1,000 mg total) rectally at bedtime. Take 3x/week.  [name] omeprazole (PRILOSEC) 20 MG capsule Take 1 capsule (20 mg total) by mouth 2 (two) times daily before a meal.  . traZODone (DESYREL) 50 MG tablet Take 25 mg by mouth at bedtime.  [name]  VITAMIN D PO Take by mouth daily.  . [DISCONTINUED] losartan (COZAAR) 25 MG tablet Take 25 mg by mouth daily.  . [DISCONTINUED] Multiple Vitamin (MULTIVITAMIN ADULT PO) Take 1 tablet by mouth daily. (Patient not taking: No sig reported)   Facility-Administered Encounter Medications as of 06/09/2020  Medication  . 0.9 %  sodium chloride infusion  . 0.9 %  sodium chloride infusion    ALLERGIES: Allergies  Allergen Reactions  . Ace Inhibitors Itching  . Peanut Oil Itching  . Shellfish Allergy Itching and Rash  . Statins Itching  . Adhesive [Tape] Rash  . Pravastatin Sodium Itching    VACCINATION STATUS: Immunization History  Administered Date(s) Administered  . Influenza,inj,Quad PF,6+ Mos 10/22/2015  . Influenza-Unspecified 10/16/2019  . Pneumococcal Polysaccharide-23 01/03/2006  . Td 10/04/2006  . Tdap 07/10/1996    Diabetes She presents for her follow-up diabetic visit. She has type 1 diabetes mellitus. Onset time: She was diagnosed at approximate age of 36 years. Her disease course has been improving. Pertinent negatives for hypoglycemia include no confusion, nervousness/anxiousness, pallor or seizures. Associated symptoms include fatigue, polydipsia and polyuria. Pertinent negatives for diabetes include no chest pain and no polyphagia. There are no hypoglycemic complications. Symptoms are improving. There are no diabetic complications. Risk factors for coronary artery disease include diabetes mellitus, dyslipidemia, obesity and sedentary lifestyle. Current diabetic treatment includes insulin injections. Her weight is fluctuating minimally. She is  following a generally unhealthy diet. When asked about meal planning, she reported none. She has not had a previous visit with a dietitian. Her home blood glucose trend is fluctuating minimally. Her breakfast blood glucose range is generally 140-180 mg/dl. Her lunch blood glucose range is generally 140-180 mg/dl. Her dinner blood glucose range is generally 140-180 mg/dl. Her bedtime blood glucose range is generally 140-180 mg/dl. Her overall blood glucose range is 140-180 mg/dl. (She lost coverage for her Dexcom CGM.  She presents with her meter showing average blood glucose of 140-150 for the last 30 days.  Her point-of-care A1c is 7.9%, overall improving from 9%.  ) An ACE inhibitor/angiotensin II receptor blocker is not being taken.  Hyperlipidemia This is a chronic problem. The current episode started more than 1 year ago. The problem is controlled. Pertinent negatives include no chest pain, myalgias or shortness of breath. Current antihyperlipidemic treatment includes statins (She was treated with atorvastatin in the past, did not continue on this medication complaining about cost.). Risk factors for coronary artery disease include diabetes mellitus, dyslipidemia, obesity and family history.    Review of systems  Constitutional: + Minimally fluctuating body weight,  current  Body mass index is 35.03 kg/m. , no fatigue, no subjective hyperthermia, no subjective hypothermia    Objective:    BP (!) 155/93   Pulse 66   Ht 5' 1"  (1.549 m)   Wt 185 lb 6.4 oz (84.1 kg)   LMP 09/04/2015 (Approximate)   BMI 35.03 kg/m   Wt Readings from Last 3 Encounters:  06/09/20 185 lb 6.4 oz (84.1 kg)  03/17/20 180 lb 9.6 oz (81.9 kg)  01/15/20 183 lb 1.9 oz (83.1 kg)      Physical Exam- Limited  Constitutional:  Body mass index is 35.03 kg/m. , not in acute distress, normal state of mind    Recent Results (from the past 2160 hour(s))  CBC with Differential     Status: None   Collection Time:  03/11/20 12:59 PM  Result Value Ref Range   WBC 4.8 4.0 -  10.5 K/uL   RBC 4.68 3.87 - 5.11 MIL/uL   Hemoglobin 12.8 12.0 - 15.0 g/dL   HCT 41.4 36.0 - 46.0 %   MCV 88.5 80.0 - 100.0 fL   MCH 27.4 26.0 - 34.0 pg   MCHC 30.9 30.0 - 36.0 g/dL   RDW 13.3 11.5 - 15.5 %   Platelets 168 150 - 400 K/uL   nRBC 0.0 0.0 - 0.2 %   Neutrophils Relative % 65 %   Neutro Abs 3.1 1.7 - 7.7 K/uL   Lymphocytes Relative 29 %   Lymphs Abs 1.4 0.7 - 4.0 K/uL   Monocytes Relative 6 %   Monocytes Absolute 0.3 0.1 - 1.0 K/uL   Eosinophils Relative 0 %   Eosinophils Absolute 0.0 0.0 - 0.5 K/uL   Basophils Relative 0 %   Basophils Absolute 0.0 0.0 - 0.1 K/uL   Immature Granulocytes 0 %   Abs Immature Granulocytes 0.01 0.00 - 0.07 K/uL    Comment: Performed at [facility], [address] [place], [place] [address]  Ferritin     Status: None   Collection Time: 03/11/20 12:59 PM  Result Value Ref Range   Ferritin 229 11 - 307 ng/mL    Comment: Performed at [facility], [name] [place], [place] [address]  Iron and TIBC     Status: None   Collection Time: 03/11/20 12:59 PM  Result Value Ref Range   Iron 104 28 - 170 ug/dL   TIBC 360 250 - 450 ug/dL   Saturation Ratios 29 10.4 - 31.8 %   UIBC 256 ug/dL    Comment: Performed at [facility], [address], [place], [place] [address]  Calprotectin, Fecal     Status: Abnormal   Collection Time: 03/24/20 11:02 AM   Specimen: Stool  Result Value Ref Range   Calprotectin, Fecal 163 (H) 0 - 120 ug/g    Comment: Concentration     Interpretation   Follow-Up <16 - 50 ug/g     Normal           None >50 -120 ug/g     Borderline       Re-evaluate in 4-6 weeks     >120 ug/g     Abnormal         Repeat as clinically                                    indicated   Comprehensive metabolic panel     Status: Abnormal   Collection Time: 06/03/20 10:06 AM  Result Value Ref Range   Glucose 168 (H) 65 - 99 mg/dL   BUN 11 6 - 24 mg/dL   Creatinine, Ser 0.69  0.57 - 1.00 mg/dL   eGFR 107 >59 mL/min/1.73   BUN/Creatinine Ratio 16 9 - 23   Sodium 145 (H) 134 - 144 mmol/L   Potassium 4.4 3.5 - 5.2 mmol/L   Chloride 106 96 - 106 mmol/L   CO2 27 20 - 29 mmol/L   Calcium 9.5 8.7 - 10.2 mg/dL   Total Protein 6.7 6.0 - 8.5 g/dL   Albumin 4.5 3.8 - 4.8 g/dL   Globulin, Total 2.2 1.5 - 4.5 g/dL   Albumin/Globulin Ratio 2.0 1.2 - 2.2   Bilirubin Total 0.6 0.0 - 1.2 mg/dL   Alkaline Phosphatase 126 (H) 44 - 121 IU/L   AST 17 0 - 40  IU/L   ALT 15 0 - 32 IU/L     Diabetic Labs (most recent): Lab Results  Component Value Date   HGBA1C 8.1 (H) 12/02/2019   HGBA1C 8.1 (A) 05/31/2019   HGBA1C 8.4 (H) 12/13/2018     Lipid Panel ( most recent) Lipid Panel     Component Value Date/Time   CHOL 154 08/29/2019 0942   TRIG 49 08/29/2019 0942   HDL 67 08/29/2019 0942   CHOLHDL 2.3 08/29/2019 0942   LDLCALC 73 08/29/2019 0942   CMP Latest Ref Rng & Units 06/03/2020 12/06/2019 12/02/2019  Glucose 65 - 99 mg/dL 168(H) 87 220(H)  BUN 6 - 24 mg/dL 11 9 9   Creatinine 0.57 - 1.00 mg/dL 0.69 0.52 0.58  Sodium 134 - 144 mmol/L 145(H) 140 143  Potassium 3.5 - 5.2 mmol/L 4.4 3.8 4.1  Chloride 96 - 106 mmol/L 106 103 104  CO2 20 - 29 mmol/L 27 29 33(H)  Calcium 8.7 - 10.2 mg/dL 9.5 9.4 9.5  Total Protein 6.0 - 8.5 g/dL 6.7 7.0 6.6  Total Bilirubin 0.0 - 1.2 mg/dL 0.6 1.1 0.7  Alkaline Phos 44 - 121 IU/L 126(H) 102 -  AST 0 - 40 IU/L 17 22 21   ALT 0 - 32 IU/L 15 28 26      Assessment & Plan:   1. Uncontrolled type 1 diabetes mellitus with hyperglycemia (HCC)  - Brittney [name] has currently uncontrolled symptomatic type 2 DM since 50 years of age.  She lost coverage for her Dexcom CGM.  She presents with her meter showing average blood glucose of 140-150 for the last 30 days.  Her point-of-care A1c is 7.9%, overall improving from 9%.       Her recent anti-GAD antibody elevated at 61 suggesting type 1 diabetes.   -her diabetes is complicated by  obesity/sedentary life and she remains at a high risk for more acute and chronic complications which include CAD, CVA, CKD, retinopathy, and neuropathy. These are all discussed in detail with her.  - I have counseled her on diet management and weight loss, by adopting a carbohydrate restricted/protein rich diet.  - she acknowledges that there is a room for improvement in her food and drink choices. - Suggestion is made for her to avoid simple carbohydrates  from her diet including Cakes, Sweet Desserts, Ice Cream, Soda (diet and regular), Sweet Tea, Candies, Chips, Cookies, Store Bought Juices, Alcohol in Excess of  1-2 drinks a day, Artificial Sweeteners,  Coffee Creamer, and "Sugar-free" Products, Lemonade. This will help patient to have more stable blood glucose profile and potentially avoid unintended weight gain.   - I encouraged her to switch to  unprocessed or minimally processed complex starch and increased protein intake (animal or plant source), fruits, and vegetables.  - she is advised to stick to a routine mealtimes to eat 3 meals  a day and avoid unnecessary snacks ( to snack only to correct hypoglycemia).    - I have approached her with the following individualized plan to manage diabetes and patient agrees:   -She will continue to need intensive treatment with basal/bolus insulin in order for her to achieve and maintain control of diabetes to target.    -She presents with more steady and stable glycemic profile.   -She is advised to continue Lantus 24 units nightly, continue Humalog  9 units 3 times a day with meals  for pre-meal BG readings of 80-188m/dl, plus patient specific correction dose for unexpected hyperglycemia above 1564mdl,  associated with strict monitoring of glucose 4 times a day-before meals and at bedtime.  - she is warned not to take insulin without proper monitoring per orders. - Adjustment parameters are given to her for hypo and hyperglycemia in writing. -  she is encouraged to call clinic for blood glucose levels less than 70 or above 200 mg /dl. -She is requesting to shift her [facility] to [facility], will send a prescription for this device to [facility].    -She is  advised to adjust her Humalog on days that she was to exercise.  - she is not a suitable candidate  for metformin, SGLT2 inhibitors, nor incretin therapy.   - Patient specific target  A1c;  LDL, HDL, Triglycerides, were discussed in detail.  2) Blood Pressure /Hypertension:  -Her blood pressure is not controlled to target.  She has urine microalbuminuria.  I discussed and increase her losartan to 50 mg p.o. daily.     3) Lipids/Hyperlipidemia: Review of her most recent lipid panel showed LDL improved to 76 from 119.  She is benefiting from atorvastatin, advised to continue atorvastatin 20 mg p.o. nightly.        4)  Weight/Diet: Her BMI is 35.0--   clearly complicating her diabetes care.  A candidate for modest weight loss, may avoid further weight gain by avoiding unnecessary snacks.  I discussed with her the fact that loss of 5 - 10% of her  current body weight will have the most impact on her diabetes management.   CDE Consult will be initiated . Exercise, and detailed carbohydrates information provided  -  detailed on discharge instructions.  5) Chronic Care/Health Maintenance:  -she  Is not on ACEI/ARB and Statin medications and  is encouraged to initiate and continue to follow up with Ophthalmology, Dentist,  Podiatrist at least yearly or according to recommendations, and advised to   stay away from smoking. I have recommended yearly flu vaccine and pneumonia vaccine at least every 5 years; moderate intensity exercise for up to 150 minutes weekly; and  sleep for at least 7 hours a day.  - she is  advised to maintain close follow up with [name], MD for primary care needs, as well as her other providers for optimal and coordinated care.    I spent 40 minutes in the  care of the patient today including review of labs from [facility], Lipids, Thyroid Function, Hematology (current and previous including abstractions from other facilities); face-to-face time discussing  her blood glucose readings/logs, discussing hypoglycemia and hyperglycemia episodes and symptoms, medications doses, her options of short and long term treatment based on the latest standards of care / guidelines;  discussion about incorporating lifestyle medicine;  and documenting the encounter.    Please refer to Patient Instructions for Blood Glucose Monitoring and Insulin/Medications Dosing Guide"  in media tab for additional information. Please  also refer to " Patient Self Inventory" in the Media  tab for reviewed elements of pertinent patient history.  [name] participated in the discussions, expressed understanding, and voiced agreement with the above plans.  All questions were answered to her satisfaction. she is encouraged to contact clinic should she have any questions or concerns prior to her return visit.  Follow up plan: - Return in about 6 months (around 12/09/2020) for Bring Meter and Logs- A1c in Office.  Glade Lloyd, MD [facility] [address] [place], [place] [address] Phone: [phone]  Fax: [phone]  06/09/2020, 9:36 AM  This note was partially dictated with voice recognition software. Similar sounding words can be transcribed inadequately or may not  be corrected upon review.

## 2020-06-16 ENCOUNTER — Other Ambulatory Visit (INDEPENDENT_AMBULATORY_CARE_PROVIDER_SITE_OTHER): Payer: Self-pay | Admitting: Internal Medicine

## 2020-06-16 DIAGNOSIS — K51919 Ulcerative colitis, unspecified with unspecified complications: Secondary | ICD-10-CM

## 2020-06-16 DIAGNOSIS — K5190 Ulcerative colitis, unspecified, without complications: Secondary | ICD-10-CM

## 2020-06-16 DIAGNOSIS — K5130 Ulcerative (chronic) rectosigmoiditis without complications: Secondary | ICD-10-CM

## 2020-06-16 DIAGNOSIS — K5120 Ulcerative (chronic) proctitis without complications: Secondary | ICD-10-CM

## 2020-06-22 ENCOUNTER — Other Ambulatory Visit (HOSPITAL_COMMUNITY): Payer: Self-pay | Admitting: Surgery

## 2020-06-22 DIAGNOSIS — D500 Iron deficiency anemia secondary to blood loss (chronic): Secondary | ICD-10-CM

## 2020-06-22 DIAGNOSIS — C50411 Malignant neoplasm of upper-outer quadrant of right female breast: Secondary | ICD-10-CM

## 2020-06-23 ENCOUNTER — Ambulatory Visit (HOSPITAL_COMMUNITY)
Admission: RE | Admit: 2020-06-23 | Discharge: 2020-06-23 | Disposition: A | Discharge status: Home / Self Care | Payer: Medicaid Other | Source: Ambulatory Visit | Attending: Oncology | Admitting: Oncology

## 2020-06-23 ENCOUNTER — Other Ambulatory Visit: Payer: Self-pay

## 2020-06-23 ENCOUNTER — Encounter (HOSPITAL_COMMUNITY): Payer: Self-pay

## 2020-06-23 ENCOUNTER — Inpatient Hospital Stay (HOSPITAL_COMMUNITY): Payer: Medicaid Other | Attending: Hematology

## 2020-06-23 DIAGNOSIS — Z79899 Other long term (current) drug therapy: Secondary | ICD-10-CM | POA: Diagnosis not present

## 2020-06-23 DIAGNOSIS — E109 Type 1 diabetes mellitus without complications: Secondary | ICD-10-CM | POA: Insufficient documentation

## 2020-06-23 DIAGNOSIS — Z9889 Other specified postprocedural states: Secondary | ICD-10-CM

## 2020-06-23 DIAGNOSIS — Z794 Long term (current) use of insulin: Secondary | ICD-10-CM | POA: Insufficient documentation

## 2020-06-23 DIAGNOSIS — C50411 Malignant neoplasm of upper-outer quadrant of right female breast: Secondary | ICD-10-CM

## 2020-06-23 DIAGNOSIS — Z853 Personal history of malignant neoplasm of breast: Secondary | ICD-10-CM | POA: Insufficient documentation

## 2020-06-23 DIAGNOSIS — D509 Iron deficiency anemia, unspecified: Secondary | ICD-10-CM | POA: Insufficient documentation

## 2020-06-23 DIAGNOSIS — E538 Deficiency of other specified B group vitamins: Secondary | ICD-10-CM | POA: Insufficient documentation

## 2020-06-23 DIAGNOSIS — D500 Iron deficiency anemia secondary to blood loss (chronic): Secondary | ICD-10-CM

## 2020-06-23 LAB — CBC WITH DIFFERENTIAL/PLATELET
Abs Immature Granulocytes: 0.01 10*3/uL (ref 0.00–0.07)
Basophils Absolute: 0 10*3/uL (ref 0.0–0.1)
Basophils Relative: 0 %
Eosinophils Absolute: 0 10*3/uL (ref 0.0–0.5)
Eosinophils Relative: 0 %
HCT: 40.4 % (ref 36.0–46.0)
Hemoglobin: 12.7 g/dL (ref 12.0–15.0)
Immature Granulocytes: 0 %
Lymphocytes Relative: 33 %
Lymphs Abs: 1.3 10*3/uL (ref 0.7–4.0)
MCH: 27.7 pg (ref 26.0–34.0)
MCHC: 31.4 g/dL (ref 30.0–36.0)
MCV: 88 fL (ref 80.0–100.0)
Monocytes Absolute: 0.2 10*3/uL (ref 0.1–1.0)
Monocytes Relative: 6 %
Neutro Abs: 2.5 10*3/uL (ref 1.7–7.7)
Neutrophils Relative %: 61 %
Platelets: 154 10*3/uL (ref 150–400)
RBC: 4.59 MIL/uL (ref 3.87–5.11)
RDW: 13.7 % (ref 11.5–15.5)
WBC: 4.1 10*3/uL (ref 4.0–10.5)
nRBC: 0 % (ref 0.0–0.2)

## 2020-06-23 LAB — COMPREHENSIVE METABOLIC PANEL
ALT: 24 U/L (ref 0–44)
AST: 24 U/L (ref 15–41)
Albumin: 4.1 g/dL (ref 3.5–5.0)
Alkaline Phosphatase: 97 U/L (ref 38–126)
Anion gap: 7 (ref 5–15)
BUN: 10 mg/dL (ref 6–20)
CO2: 29 mmol/L (ref 22–32)
Calcium: 9.3 mg/dL (ref 8.9–10.3)
Chloride: 102 mmol/L (ref 98–111)
Creatinine, Ser: 0.61 mg/dL (ref 0.44–1.00)
GFR, Estimated: >60 mL/min (ref >60)
Glucose, Bld: 258 mg/dL — ABNORMAL HIGH (ref 70–99)
Potassium: 3.4 mmol/L — ABNORMAL LOW (ref 3.5–5.1)
Sodium: 138 mmol/L (ref 135–145)
Total Bilirubin: 0.5 mg/dL (ref 0.3–1.2)
Total Protein: 6.8 g/dL (ref 6.5–8.1)

## 2020-06-23 LAB — VITAMIN D 25 HYDROXY (VIT D DEFICIENCY, FRACTURES): Vit D, 25-Hydroxy: 54.84 ng/mL (ref 30–100)

## 2020-06-23 LAB — IRON AND TIBC
Iron: 62 ug/dL (ref 28–170)
Saturation Ratios: 17 % (ref 10.4–31.8)
TIBC: 362 ug/dL (ref 250–450)
UIBC: 300 ug/dL

## 2020-06-23 LAB — FERRITIN: Ferritin: 200 ng/mL (ref 11–307)

## 2020-06-23 LAB — VITAMIN B12: Vitamin B-12: 794 pg/mL (ref 180–914)

## 2020-06-30 ENCOUNTER — Ambulatory Visit (HOSPITAL_COMMUNITY): Payer: Medicaid Other | Admitting: Hematology and Oncology

## 2020-07-14 ENCOUNTER — Telehealth: Payer: Self-pay | Admitting: "Endocrinology

## 2020-07-14 NOTE — Telephone Encounter (Signed)
Pt is calling and states she is still not getting her dexcom supplies, states they have been requesting a PA and documentation. Pt requesting a call back [phone]

## 2020-07-14 NOTE — Telephone Encounter (Signed)
Looked back at when PA was submitted. Dexcom was denied 4 x and libre also denied. Pt states all we had to do was send in documentation. I explained to her that this had been sent in several times.

## 2020-07-20 ENCOUNTER — Inpatient Hospital Stay (HOSPITAL_COMMUNITY): Payer: Medicaid Other | Attending: Physician Assistant | Admitting: Physician Assistant

## 2020-07-20 ENCOUNTER — Ambulatory Visit (HOSPITAL_COMMUNITY): Payer: Medicaid Other | Admitting: Hematology

## 2020-07-20 ENCOUNTER — Other Ambulatory Visit: Payer: Self-pay

## 2020-07-20 VITALS — BP 135/82 | HR 79 | Temp 97.0°F | Resp 18 | Wt 186.4 lb

## 2020-07-20 DIAGNOSIS — Z853 Personal history of malignant neoplasm of breast: Secondary | ICD-10-CM | POA: Diagnosis present

## 2020-07-20 DIAGNOSIS — Z171 Estrogen receptor negative status [ER-]: Secondary | ICD-10-CM

## 2020-07-20 DIAGNOSIS — Z794 Long term (current) use of insulin: Secondary | ICD-10-CM | POA: Diagnosis not present

## 2020-07-20 DIAGNOSIS — D500 Iron deficiency anemia secondary to blood loss (chronic): Secondary | ICD-10-CM | POA: Diagnosis not present

## 2020-07-20 DIAGNOSIS — Z79899 Other long term (current) drug therapy: Secondary | ICD-10-CM | POA: Diagnosis not present

## 2020-07-20 DIAGNOSIS — D509 Iron deficiency anemia, unspecified: Secondary | ICD-10-CM | POA: Insufficient documentation

## 2020-07-20 DIAGNOSIS — E119 Type 2 diabetes mellitus without complications: Secondary | ICD-10-CM | POA: Diagnosis not present

## 2020-07-20 DIAGNOSIS — Z923 Personal history of irradiation: Secondary | ICD-10-CM | POA: Diagnosis not present

## 2020-07-20 DIAGNOSIS — C50411 Malignant neoplasm of upper-outer quadrant of right female breast: Secondary | ICD-10-CM | POA: Diagnosis not present

## 2020-07-20 DIAGNOSIS — E538 Deficiency of other specified B group vitamins: Secondary | ICD-10-CM | POA: Insufficient documentation

## 2020-07-20 NOTE — Progress Notes (Signed)
Brittney [name], [place] [address]   CLINIC:  Medical Oncology/Hematology  PCP:  Loa Socks, MD [place] [address] [phone]   REASON FOR VISIT: Follow-up for breast cancer and iron deficiency anemia   CURRENT THERAPY: Surveillance and intermittent iron infusions  BRIEF ONCOLOGIC HISTORY:  Oncology History  Malignant neoplasm of upper-outer quadrant of right female breast ([place])  04/29/2015 Initial Biopsy   upper outer quadrant mass R breast 2.9 cm by ultrasound, high grade invasive ductal carcinoma ER< 1%, PR 1%, HER -2 not amplified, Ki-67 66%   05/13/2015 Cancer Staging   pT1cN0   05/13/2015 Surgery   lumpectomy and sentinel node biopsies X 3, 2 cm tumor, 3 negative nodes    05/21/2015 Imaging   CT [facility] with postsurgical changes related to R breast lympectomy and R axillary LN dissection, no findings suspicious for metastatic disease   06/12/2015 Procedure   Port placement by Dr. Anthony Sar   06/17/2015 Imaging   MUGA- Normal left ventricular ejection fraction equal to 62.9%.   06/25/2015 - 08/13/2015 Chemotherapy   AC x 4   08/27/2015 - 09/03/2015 Chemotherapy   Weekly Taxol x 2 cycles    09/11/2015 - 09/15/2015 Hospital Admission   Neutropenic fever, ARF secondary to antibiotics   10/01/2015 - 11/12/2015 Chemotherapy   Taxotere 75 mg/m2 Q3 weeks x 3 cycles     10/01/2015 Treatment Plan Change   Taxol changed to Taxotere given hospitalization.     - 01/14/2016 Radiation Therapy   Radiation in [facility]   05/11/2016 Mammogram   IMPRESSION: No evidence of malignancy within either breast. Expected postsurgical changes within the right breast.      CANCER STAGING: Cancer Staging Malignant neoplasm of upper-outer quadrant of right female breast [facility]) Staging form: Breast, AJCC 7th Edition - Clinical stage from 05/13/2015: Stage IA (T1c, N0, M0) - Signed by Baird Cancer, PA-C on 06/25/2015    INTERVAL  HISTORY:  Brittney [name] 50 y.o. female returns for routine follow-up for breast cancer and iron deficiency anemia.  She was last seen in hematology/oncology on 12/12/2019.   In the interim, she was seen by GI for history of ulcerative colitis on 03/17/2020.  Recommend to continue Canasa suppository 3 times a week, continue Asacol/Mesalamine at a total daily dose of 4800 mg.  She was evaluated by endocrinology on 06/09/2020 for uncontrolled type 1 diabetes with hyperglycemia incidents. Hemoglobin A1C has improved from 9% to 7.9%.  She continued short acting and long-acting insulin.   Today, she reports that her energy levels are stable. Her fatigue comes and goes but she is able to complete her ADLs on her own. Her appetite is good and she is trying to eat a balanced diet. Patient denies any nausea, vomiting or abdominal pain. She has chronic mid back pain that presents occasionally. She takes Tylenol for the back pain with improvement. Her bowel movements are steady. Her stools vary from formed to soft stools. She denies hematochezia or melena but has occasional episodes of bowel urgency. She denies any fevers, chills, night sweats, shortness of breath, chest pain or cough. She has no other complaints.   REVIEW OF SYSTEMS:  Review of Systems  Constitutional:  Positive for appetite change (80%) and fatigue (70%). Negative for chills and fever.  Respiratory:  Negative for cough and shortness of breath.   Cardiovascular:  Negative for chest pain and leg swelling.  Gastrointestinal:  Negative for abdominal  pain, blood in stool, constipation, diarrhea, nausea, rectal pain and vomiting.  Musculoskeletal:  Positive for back pain (chronic mid back pain that is occasional).  Skin:  Negative for itching and rash.  Neurological:  Negative for dizziness and headaches.  All other systems reviewed and are negative.   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) [facility])    Past Surgical History:  Procedure Laterality Date   BIOPSY  12/05/2018   Procedure: BIOPSY;  Surgeon: Rogene Houston, MD;  [facility];  Service: Endoscopy;;  duodenum gastric colon   BREAST LUMPECTOMY Right 2017   CESAREAN SECTION     COLONOSCOPY N/A 10/09/2013   Procedure: COLONOSCOPY;  Surgeon: Rogene Houston, MD;  Location: [facility];  Service: Endoscopy;  Laterality: N/A;  100   COLONOSCOPY N/A 12/05/2018   Procedure: COLONOSCOPY;  Surgeon: Rogene Houston, MD;  Location: [facility];  Service: Endoscopy;  Laterality: N/A;   DILATION AND CURETTAGE OF UTERUS     x 2 for miscarriages   ESOPHAGOGASTRODUODENOSCOPY     ESOPHAGOGASTRODUODENOSCOPY N/A 12/05/2018   Procedure: ESOPHAGOGASTRODUODENOSCOPY (EGD);  Surgeon: Rogene Houston, MD;  Location: [facility];  Service: Endoscopy;  Laterality: N/A;  200pm   FLEXIBLE SIGMOIDOSCOPY     FLEXIBLE SIGMOIDOSCOPY  07/27/2011   Procedure: FLEXIBLE SIGMOIDOSCOPY;  Surgeon: Rogene Houston, MD;  [facility];  Service: Endoscopy;  Laterality: N/A;  100     SOCIAL HISTORY:  Social History   Socioeconomic History   Marital status: Single    Spouse name: Not on file   Number of children: Not on file   Years of education: Not on file   Highest education level: Not on file  Occupational History   Not on file  Tobacco Use   Smoking status: Never   Smokeless tobacco: Never  Vaping Use   Vaping Use: Never used  Substance and Sexual Activity   Alcohol  use: No    Alcohol/week: 0.0 standard drinks   Drug use: No   Sexual activity: Not on file  Other Topics Concern   Not on file  Social History Narrative   Not on file   Social Determinants of Health   Financial Resource Strain: Low Risk    Difficulty of Paying Living Expenses: Not hard at all  Food Insecurity: No Food Insecurity   Worried About Charity fundraiser in the Last Year: Never true   [place] in the Last Year: Never true  Transportation Needs: No Transportation Needs   Lack of Transportation (Medical): No   Lack of Transportation (Non-Medical): No  Physical Activity: Inactive   Days of Exercise per Week: 0 days   Minutes of Exercise per Session: 0 min  Stress: No Stress Concern Present   Feeling of Stress : Not at all  Social Connections: Moderately Integrated   Frequency of Communication with Friends and Family: More  than three times a week   Frequency of Social Gatherings with Friends and Family: Three times a week   Attends Religious Services: 1 to 4 times per year   Active Member of Clubs or Organizations: No   Attends Music therapist: 1 to 4 times per year   Marital Status: Never married  Human resources officer Violence: Not At Risk   Fear of Current or Ex-Partner: No   Emotionally Abused: No   Physically Abused: No   Sexually Abused: No    FAMILY HISTORY:  Family History  Problem Relation Age of Onset   Non-Hodgkin's lymphoma Mother    Hypertension Mother    Diabetes Mellitus II Mother    Hypertension Father    Diabetes Mellitus II Father    Breast cancer Cousin    Breast cancer Maternal Aunt    Colon cancer Maternal Uncle     CURRENT MEDICATIONS:  Outpatient Encounter Medications as of 07/20/2020  Medication Sig Note   ACCU-CHEK AVIVA PLUS test strip SMARTSIG:Via Meter 6 Times Daily    acyclovir (ZOVIRAX) 200 MG capsule Take 400 mg by mouth daily. Reported on 07/23/2015    ARIPiprazole (ABILIFY) 30 MG tablet Take 30 mg by mouth in  the morning.    atorvastatin (LIPITOR) 20 MG tablet Take 20 mg by mouth daily.     Continuous Blood Gluc Receiver (DEXCOM G6 RECEIVER) DEVI 1 Piece by Does not apply route as needed.    Continuous Blood Gluc Sensor (DEXCOM G6 SENSOR) MISC SMARTSIG:1 Each Topical Every 10 Days    Continuous Blood Gluc Sensor (DEXCOM G6 SENSOR) MISC 4 Pieces by Does not apply route once a week.    Continuous Blood Gluc Transmit (DEXCOM G6 TRANSMITTER) MISC     Continuous Blood Gluc Transmit (DEXCOM G6 TRANSMITTER) MISC 1 Piece by Does not apply route as directed.    desvenlafaxine (PRISTIQ) 50 MG 24 hr tablet Take 50 mg by mouth every morning.    Desvenlafaxine Succinate ER 25 MG TB24 Take 25 mg by mouth daily.    diclofenac Sodium (VOLTAREN) 1 % GEL Apply 1 application topically 3 (three) times daily. 03/17/2020: Per patient she uses as needed.   gabapentin (NEURONTIN) 300 MG capsule Take 300-600 mg by mouth See admin instructions. Take 1 capsule (300 mg) by mouth in the morning & take 2 capsules (600 mg) by mouth at night    HUMALOG 100 UNIT/ML injection Inject 9-12 Units into the skin 3 (three) times daily with meals. Sliding Scale Insulin    LANTUS SOLOSTAR 100 UNIT/ML Solostar Pen Inject 24 Units into the skin at bedtime.    loratadine (CLARITIN) 10 MG tablet Take 10 mg by mouth daily.     losartan (COZAAR) 50 MG tablet Take 1 tablet (50 mg total) by mouth daily.    mesalamine (CANASA) 1000 MG suppository Place 1 suppository (1,000 mg total) rectally at bedtime. Take 3x/week. 03/17/2020: Patient reports that she uses every other day.   Mesalamine 800 MG TBEC TAKE TWO (2) TABLETS BY MOUTH IN THE MORNING, AT NOON, AND AT BEDTIME    omeprazole (PRILOSEC) 20 MG capsule Take 1 capsule (20 mg total) by mouth 2 (two) times daily before a meal.    traZODone (DESYREL) 50 MG tablet Take 25 mg by mouth at bedtime. 03/17/2020: Patient reports that she is taking 25 mg by mouth at bedtime.   VITAMIN D PO Take by mouth daily.  03/17/2020: Patient reports that she is taking by mouth every  other day.   acetaminophen (TYLENOL) 500 MG tablet Take 1,000 mg by mouth every 6 (six) hours as needed. For pain (Patient not taking: Reported on 07/20/2020)    albuterol (PROVENTIL HFA;VENTOLIN HFA) 108 (90 Base) MCG/ACT inhaler Inhale 2 puffs into the lungs every 4 (four) hours as needed for wheezing or shortness of breath.  (Patient not taking: Reported on 07/20/2020)    ondansetron (ZOFRAN-ODT) 8 MG disintegrating tablet Take 8 mg by mouth every 8 (eight) hours as needed. (Patient not taking: Reported on 07/20/2020)    Facility-Administered Encounter Medications as of 07/20/2020  Medication   0.9 %  sodium chloride infusion   0.9 %  sodium chloride infusion    ALLERGIES:  Allergies  Allergen Reactions   Ace Inhibitors Itching   Peanut Oil Itching   Shellfish Allergy Itching and Rash   Statins Itching   Adhesive [Tape] Rash   Pravastatin Sodium Itching   Pedi-Pre Tape Spray [Wound Dressing Adhesive]    Shellfish-Derived Products      PHYSICAL EXAM:  ECOG Performance status: 1  Vitals:   07/20/20 1057  BP: 135/82  Pulse: 79  Resp: 18  Temp: (!) 97 F (36.1 C)  SpO2: 100%   Filed Weights   07/20/20 1057  Weight: 186 lb 6.4 oz (84.6 kg)   Physical Exam Constitutional:      Appearance: Normal appearance. She is normal weight.  Cardiovascular:     Rate and Rhythm: Normal rate and regular rhythm.     Heart sounds: Normal heart sounds.  Pulmonary:     Effort: Pulmonary effort is normal.     Breath sounds: Normal breath sounds.  Chest:     Chest wall: No mass or swelling.     Comments: Right breast: Lumpectomy site well-healed scar tissue palpable.  No palpable masses, no skin changes or nipple discharge, no adenopathy. Left breast: No palpable masses, no skin changes or nipple discharge, no adenopathy. Abdominal:     General: Bowel sounds are normal.     Palpations: Abdomen is soft.  Musculoskeletal:         General: Normal range of motion.  Skin:    General: Skin is warm.  Neurological:     Mental Status: She is alert and oriented to person, place, and time. Mental status is at baseline.  Psychiatric:        Mood and Affect: Mood normal.        Behavior: Behavior normal.        Thought Content: Thought content normal.        Judgment: Judgment normal.    LABORATORY DATA:  I have reviewed the labs as listed.  CBC    Component Value Date/Time   WBC 4.1 06/23/2020 1158   RBC 4.59 06/23/2020 1158   HGB 12.7 06/23/2020 1158   HCT 40.4 06/23/2020 1158   PLT 154 06/23/2020 1158   MCV 88.0 06/23/2020 1158   MCH 27.7 06/23/2020 1158   MCHC 31.4 06/23/2020 1158   RDW 13.7 06/23/2020 1158   LYMPHSABS 1.3 06/23/2020 1158   MONOABS 0.2 06/23/2020 1158   EOSABS 0.0 06/23/2020 1158   BASOSABS 0.0 06/23/2020 1158   CMP Latest Ref Rng & Units 06/23/2020 06/03/2020 12/06/2019  Glucose 70 - 99 mg/dL 258(H) 168(H) 87  BUN 6 - 20 mg/dL 10 11 9   Creatinine 0.44 - 1.00 mg/dL 0.61 0.69 0.52  Sodium 135 - 145 mmol/L 138 145(H) 140  Potassium 3.5 - 5.1 mmol/L 3.4(L) 4.4 3.8  Chloride 98 - 111 mmol/L 102 106 103  CO2 22 - 32 mmol/L 29 27 29   Calcium 8.9 - 10.3 mg/dL 9.3 9.5 9.4  Total Protein 6.5 - 8.1 g/dL 6.8 6.7 7.0  Total Bilirubin 0.3 - 1.2 mg/dL 0.5 0.6 1.1  Alkaline Phos 38 - 126 U/L 97 126(H) 102  AST 15 - 41 U/L 24 17 22   ALT 0 - 44 U/L 24 15 28     All questions were answered to patient's stated satisfaction. Encouraged patient to call with any new concerns or questions before his next visit to the cancer center and we can certain see him sooner, if needed.     ASSESSMENT & PLAN:  1.  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 Anthony Sar on 05/13/2015. -Status post adjuvant chemotherapy with dose dense AC followed by 3 cycles of dose dense docetaxel from 06/25/2015-11/12/2015. -Physical examination today is within  normal limits lumpectomy scar in the right breast upper outer quadrant is stable.  No palpable masses. -Last mammogram was dated 06/23/2020 which was BI-RADS Category 2 benign. -Labs on 06/23/2020 were reviewed without any intervention needed.  -She will follow-up in 6 months with repeat labs.   2.  Iron deficiency state: -This is from chronic blood loss from ulcerative colitis. -Last Feraheme was on 06/13/2019 and 06/20/2019. -She occasionally has episodes of bleeding per rectum secondary to ulcerative colitis. -Labs on 06/23/2020 shows hemoglobin 12.7, Ferritin 200 and iron saturation was 17%. --Monitor for now. No additional IV iron is required. Repeat labs in 6 months.    3.  B12 deficiency: -Labs on 06/23/2020 is 794. Continue to monitor.   4.  Ulcerative colitis: -She had intermittent rectal bleeding secondary to ulcerative colitis. -She is followed closely by GI. -She currently on oral mesalamine and Canasa suppositories 3 times a week. -Fecal calprotectin continues to be elevated indicating inflammation but has improved.  5.Depression: -She is seeing a therapist every 3 months. -Stable on Celexa. -She will talk to him about switching medications if this does not work for her. -Her PCP also placed her on Abilify at nighttime.   6.  Type 1 diabetes: -Currently on sliding scale insulin and Lantus. -Followed closely by endocrinology. -Current A1c 7.9  No problem-specific Assessment & Plan notes found for this encounter.  Patient expressed understanding and satisfaction with the plan provided.   I have spent a total of 25 minutes minutes of face-to-face and non-face-to-face time, preparing to see the patient, obtaining and/or reviewing separately obtained history, performing a medically appropriate examination, counseling and educating the patient, documenting clinical information in the electronic health record, and care coordination.   Lincoln Brigham PA-C Hematology and  [place]

## 2020-09-17 ENCOUNTER — Other Ambulatory Visit (INDEPENDENT_AMBULATORY_CARE_PROVIDER_SITE_OTHER): Payer: Self-pay | Admitting: Internal Medicine

## 2020-09-22 ENCOUNTER — Ambulatory Visit (INDEPENDENT_AMBULATORY_CARE_PROVIDER_SITE_OTHER): Payer: Medicaid Other | Admitting: Internal Medicine

## 2020-09-22 ENCOUNTER — Other Ambulatory Visit: Payer: Self-pay

## 2020-09-22 ENCOUNTER — Encounter (INDEPENDENT_AMBULATORY_CARE_PROVIDER_SITE_OTHER): Payer: Self-pay | Admitting: Internal Medicine

## 2020-09-22 VITALS — BP 130/77 | HR 82 | Temp 98.6°F | Ht 61.0 in | Wt 186.0 lb

## 2020-09-22 DIAGNOSIS — K3184 Gastroparesis: Secondary | ICD-10-CM

## 2020-09-22 DIAGNOSIS — K219 Gastro-esophageal reflux disease without esophagitis: Secondary | ICD-10-CM | POA: Diagnosis not present

## 2020-09-22 DIAGNOSIS — K5130 Ulcerative (chronic) rectosigmoiditis without complications: Secondary | ICD-10-CM

## 2020-09-22 NOTE — Progress Notes (Signed)
Presenting complaint;  Follow for ulcerative colitis, GERD and gastroparesis.  Database and subjective:  Patient is a 50 year old African-American female who has 25-year history of distal ulcerative colitis chronic GERD gastroparesis as well as history of iron deficiency anemia who is here for scheduled visit. She had EGD and colonoscopy in December 2020. EGD revealed gastric fundic polyps and normal duodenal biopsy. Colonoscopy revealed mild active disease in distal sigmoid colon and moderately active chronic colitis and rectum. She has been maintained on oral mesalamine as well as Canasa suppositories.  She is using suppository every other night.  Patient states she is doing well.  On most days she has 1 formed stool.  Every now and then she may have to in a day.  She has occasional blood on the tissue no more than couple of times in a month.  Is never frank bleeding.  She denies abdominal pain.  She says her appetite is fair but not normal.  She is not losing weight.  She denies nausea vomiting.  She feels heartburn is well controlled with PPI. She states she is planning to back to physical activity and hoping to walk 30 to 45 minutes every other day. She had blood work by [name] earlier this month which is summarized below.   Current Medications: Outpatient Encounter Medications as of 09/22/2020  Medication Sig   ACCU-CHEK AVIVA PLUS test strip SMARTSIG:Via Meter 6 Times Daily   acetaminophen (TYLENOL) 500 MG tablet Take 1,000 mg by mouth every 6 (six) hours as needed. For pain   acyclovir (ZOVIRAX) 200 MG capsule Take 400 mg by mouth daily. Reported on 07/23/2015   albuterol (PROVENTIL HFA;VENTOLIN HFA) 108 (90 Base) MCG/ACT inhaler Inhale 2 puffs into the lungs every 4 (four) hours as needed for wheezing or shortness of breath.   ARIPiprazole (ABILIFY) 30 MG tablet Take 30 mg by mouth in the morning.   atorvastatin (LIPITOR) 20 MG tablet Take 20 mg by mouth daily.    Continuous  Blood Gluc Receiver (DEXCOM G6 RECEIVER) DEVI 1 Piece by Does not apply route as needed.   Continuous Blood Gluc Sensor (DEXCOM G6 SENSOR) MISC SMARTSIG:1 Each Topical Every 10 Days   Continuous Blood Gluc Sensor (DEXCOM G6 SENSOR) MISC 4 Pieces by Does not apply route once a week.   Continuous Blood Gluc Transmit (DEXCOM G6 TRANSMITTER) MISC    Continuous Blood Gluc Transmit (DEXCOM G6 TRANSMITTER) MISC 1 Piece by Does not apply route as directed.   desvenlafaxine (PRISTIQ) 50 MG 24 hr tablet Take 50 mg by mouth every morning.   Desvenlafaxine Succinate ER 25 MG TB24 Take 25 mg by mouth daily.   diclofenac Sodium (VOLTAREN) 1 % GEL Apply 1 application topically 3 (three) times daily.   gabapentin (NEURONTIN) 300 MG capsule Take 300-600 mg by mouth See admin instructions. Take 1 capsule (300 mg) by mouth in the morning & take 2 capsules (600 mg) by mouth at night   HUMALOG 100 UNIT/ML injection Inject 9-12 Units into the skin 3 (three) times daily with meals. Sliding Scale Insulin   LANTUS SOLOSTAR 100 UNIT/ML Solostar Pen Inject 24 Units into the skin at bedtime.   loratadine (CLARITIN) 10 MG tablet Take 10 mg by mouth daily.    losartan (COZAAR) 50 MG tablet Take 1 tablet (50 mg total) by mouth daily.   mesalamine (CANASA) 1000 MG suppository Place 1 suppository (1,000 mg total) rectally at bedtime. Take 3x/week.   Mesalamine 800 MG TBEC TAKE TWO (2) TABLETS  BY MOUTH IN THE MORNING, AT NOON, AND AT BEDTIME   omeprazole (PRILOSEC) 20 MG capsule TAKE ONE CAPSULE BY MOUTH TWICE DAILY BEFORE A MEAL   ondansetron (ZOFRAN-ODT) 8 MG disintegrating tablet Take 8 mg by mouth every 8 (eight) hours as needed.   traZODone (DESYREL) 50 MG tablet Take 25 mg by mouth at bedtime.   VITAMIN D PO Take by mouth daily.     Objective: Blood pressure 130/77, pulse 82, temperature 98.6 F (37 C), temperature source Oral, height _0  (1.549 m), weight 186 lb (84.4 kg), last menstrual period 09/04/2015. Patient  is alert and in no acute distress. Conjunctiva is pink. Sclera is nonicteric Oropharyngeal mucosa is normal. No neck masses or thyromegaly noted. Cardiac exam with regular rhythm normal S1 and S2. No murmur or gallop noted. Lungs are clear to auscultation. Abdomen is full but soft and nontender with organomegaly or masses. No LE edema or clubbing noted.  Labs/studies Results:   CBC Latest Ref Rng & Units 06/23/2020 03/11/2020 12/06/2019  WBC 4.0 - 10.5 K/uL 4.1 4.8 4.8  Hemoglobin 12.0 - 15.0 g/dL 12.7 12.8 13.1  Hematocrit 36.0 - 46.0 % 40.4 41.4 42.1  Platelets 150 - 400 K/uL 154 168 125(L)    CMP Latest Ref Rng & Units 06/23/2020 06/03/2020 12/06/2019  Glucose 70 - 99 mg/dL 258(H) 168(H) 87  BUN 6 - 20 mg/dL _1 Creatinine 0.44 - 1.00 mg/dL 0.61 0.69 0.52  Sodium 135 - 145 mmol/L 138 145(H) 140  Potassium 3.5 - 5.1 mmol/L 3.4(L) 4.4 3.8  Chloride 98 - 111 mmol/L 102 106 103  CO2 22 - 32 mmol/L _2 Calcium 8.9 - 10.3 mg/dL 9.3 9.5 9.4  Total Protein 6.5 - 8.1 g/dL 6.8 6.7 7.0  Total Bilirubin 0.3 - 1.2 mg/dL 0.5 0.6 1.1  Alkaline Phos 38 - 126 U/L 97 126(H) 102  AST 15 - 41 U/L _3 ALT 0 - 44 U/L _4 Hepatic Function Latest Ref Rng & Units 06/23/2020 06/03/2020 12/06/2019  Total Protein 6.5 - 8.1 g/dL 6.8 6.7 7.0  Albumin 3.5 - 5.0 g/dL 4.1 4.5 4.0  AST 15 - 41 U/L _5 ALT 0 - 44 U/L _6 Alk Phosphatase 38 - 126 U/L 97 126(H) 102  Total Bilirubin 0.3 - 1.2 mg/dL 0.5 0.6 1.1    Fecal calprotectin 523 on 07/10/2019 Fecal calprotectin 292 on 12/02/2019 Fecal calprotectin 163 on 03/24/2020  [facility]2020 BUN 10, creatinine 0.70 Sodium 143, potassium 4.7, chloride 102, CO2 26 Serum calcium 9.5 Bilirubin 0.7, AP 125, AST 15, ALT 16, total protein 6.2 and albumin 4.1  Glucose 260 A1c 7.5  No CBC available.   Assessment:  #1.  Chronic distal ulcerative colitis.  She appears to be doing well.  Sequential  calprotectin levels have been trending down but not normal.  Since she is doing well she can try using Canasa suppository every weekend or 2 doses per week.  We will consider fecal calprotectin following her next visit.  #2.  GERD and gastroparesis.  She is doing well with dietary measures and double dose PPI.  Last gastric emptying study was in April 2015 but is only to our study.  Should she develop symptoms of worsening gastroparesis would consider 4-hour study.  #3.  History of iron deficiency anemia most likely secondary to impaired iron absorption in the setting of  chronic PPI therapy.    Plan:  Continue oral mesalamine at a current dose of 1600 mg p.o. 3 times daily. Use mesalamine suppository every Saturday and Sunday. Continue omeprazole at current dose of 20 mg p.o. twice daily. Patient will call office if she develops postprandial nausea vomiting or early satiety or hematochezia accelerates. Office visit in 6 months.

## 2020-09-22 NOTE — Patient Instructions (Signed)
Notify if you have postprandial nausea or vomiting. Notify if you have frank rectal bleeding more than just blood on the tissue. Can try taking Canasa every weekend(2 or 3 doses). Will request copy of recent blood work from [name] office

## 2020-09-30 ENCOUNTER — Encounter (INDEPENDENT_AMBULATORY_CARE_PROVIDER_SITE_OTHER): Payer: Self-pay

## 2020-10-15 ENCOUNTER — Other Ambulatory Visit: Payer: Self-pay | Admitting: "Endocrinology

## 2020-11-04 ENCOUNTER — Other Ambulatory Visit (INDEPENDENT_AMBULATORY_CARE_PROVIDER_SITE_OTHER): Payer: Self-pay | Admitting: Internal Medicine

## 2020-11-04 DIAGNOSIS — K51919 Ulcerative colitis, unspecified with unspecified complications: Secondary | ICD-10-CM

## 2020-11-04 DIAGNOSIS — K5190 Ulcerative colitis, unspecified, without complications: Secondary | ICD-10-CM

## 2020-11-04 DIAGNOSIS — K5120 Ulcerative (chronic) proctitis without complications: Secondary | ICD-10-CM

## 2020-11-04 DIAGNOSIS — K5130 Ulcerative (chronic) rectosigmoiditis without complications: Secondary | ICD-10-CM

## 2020-11-04 NOTE — Telephone Encounter (Signed)
Seen in office on 09/22/20

## 2020-11-12 ENCOUNTER — Other Ambulatory Visit (INDEPENDENT_AMBULATORY_CARE_PROVIDER_SITE_OTHER): Payer: Self-pay | Admitting: Internal Medicine

## 2020-12-09 ENCOUNTER — Other Ambulatory Visit: Payer: Self-pay

## 2020-12-09 ENCOUNTER — Encounter: Payer: Self-pay | Admitting: "Endocrinology

## 2020-12-09 ENCOUNTER — Ambulatory Visit: Payer: Medicaid Other | Admitting: "Endocrinology

## 2020-12-09 VITALS — BP 136/78 | HR 80 | Ht 61.0 in | Wt 188.4 lb

## 2020-12-09 DIAGNOSIS — E782 Mixed hyperlipidemia: Secondary | ICD-10-CM

## 2020-12-09 DIAGNOSIS — E1065 Type 1 diabetes mellitus with hyperglycemia: Secondary | ICD-10-CM

## 2020-12-09 DIAGNOSIS — E559 Vitamin D deficiency, unspecified: Secondary | ICD-10-CM

## 2020-12-09 LAB — POCT GLYCOSYLATED HEMOGLOBIN (HGB A1C): HbA1c, POC (controlled diabetic range): 7.6 % — AB (ref 0.0–7.0)

## 2020-12-09 NOTE — Patient Instructions (Signed)

## 2020-12-09 NOTE — Progress Notes (Signed)
12/09/2020, 12:23 PM     Endocrinology follow-up note  Subjective:    Patient ID: Brittney [name], female    DOB: [birth year].  Brittney [name] is being seen in follow-up in the management of currently uncontrolled symptomatic type 1 diabetes, hypertension, hyperlipidemia. PMD: [name], MD.   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) [facility])     Past Surgical History:  Procedure Laterality Date   BIOPSY  12/05/2018   Procedure: BIOPSY;  Surgeon: Rogene Houston, MD;  [facility];  Service: Endoscopy;;  duodenum gastric colon   BREAST LUMPECTOMY Right 2017   CESAREAN SECTION     COLONOSCOPY N/A 10/09/2013   Procedure: COLONOSCOPY;  Surgeon: Rogene Houston, MD;  Location: [facility];  Service: Endoscopy;  Laterality: N/A;  100   COLONOSCOPY N/A 12/05/2018   Procedure: COLONOSCOPY;  Surgeon: Rogene Houston, MD;  Location[facility];  Service: Endoscopy;  Laterality: N/A;   DILATION AND CURETTAGE OF UTERUS     x 2 for miscarriages   ESOPHAGOGASTRODUODENOSCOPY     ESOPHAGOGASTRODUODENOSCOPY N/A 12/05/2018   Procedure: ESOPHAGOGASTRODUODENOSCOPY (EGD);  Surgeon: Rogene Houston, MD;  [facility];  Service: Endoscopy;  Laterality: N/A;  200pm   FLEXIBLE SIGMOIDOSCOPY     FLEXIBLE SIGMOIDOSCOPY  07/27/2011   Procedure: FLEXIBLE SIGMOIDOSCOPY;  Surgeon: Rogene Houston, MD;  Location: [facility];  Service: Endoscopy;  Laterality: N/A;  100    Social History   Socioeconomic History   Marital status: Single    Spouse name: Not on file   Number of children: Not on file   Years of education: Not on file   Highest education level: Not on file  Occupational History   Not on file  Tobacco Use   Smoking status: Never   Smokeless tobacco: Never  Vaping Use   Vaping Use: Never used  Substance and Sexual Activity   Alcohol use: No    Alcohol/week: 0.0 standard drinks   Drug use: No   Sexual activity: Not on file  Other Topics Concern   Not on file  Social History Narrative   Not on file   Social Determinants of Health   Financial Resource Strain: Low Risk    Difficulty of Paying Living Expenses: Not hard at all  Food Insecurity: No Food Insecurity   Worried About Charity fundraiser in the Last Year: Never true   [place] in the Last Year: Never true  Transportation Needs: No Transportation Needs   Lack of Transportation (  Medical): No   Lack of Transportation (Non-Medical): No  Physical Activity: Inactive   Days of Exercise per Week: 0 days   Minutes of Exercise per Session: 0 min  Stress: No Stress Concern Present   Feeling of Stress : Not at all  Social Connections: Moderately Integrated   Frequency of Communication with Friends and Family: More than three times a week   Frequency of Social Gatherings with Friends and Family: Three times a week   Attends Religious Services: 1 to 4 times per year   Active Member of Clubs or Organizations: No   Attends Music therapist: 1 to 4 times per year   Marital Status: Never married    Family History  Problem Relation Age of Onset   Non-Hodgkin's lymphoma Mother    Hypertension Mother    Diabetes Mellitus II Mother    Hypertension Father    Diabetes Mellitus II Father    Breast cancer Cousin    Breast cancer Maternal Aunt    Colon cancer Maternal Uncle     Outpatient Encounter  Medications as of 12/09/2020  Medication Sig   ACCU-CHEK AVIVA PLUS test strip SMARTSIG:Via Meter 6 Times Daily   acetaminophen (TYLENOL) 500 MG tablet Take 1,000 mg by mouth every 6 (six) hours as needed. For pain   acyclovir (ZOVIRAX) 200 MG capsule Take 400 mg by mouth daily. Reported on 07/23/2015   albuterol (PROVENTIL HFA;VENTOLIN HFA) 108 (90 Base) MCG/ACT inhaler Inhale 2 puffs into the lungs every 4 (four) hours as needed for wheezing or shortness of breath.   ARIPiprazole (ABILIFY) 30 MG tablet Take 30 mg by mouth in the morning.   atorvastatin (LIPITOR) 20 MG tablet Take 20 mg by mouth daily.    Continuous Blood Gluc Receiver (DEXCOM G6 RECEIVER) DEVI 1 Piece by Does not apply route as needed.   Continuous Blood Gluc Sensor (DEXCOM G6 SENSOR) MISC SMARTSIG:1 Each Topical Every 10 Days   Continuous Blood Gluc Sensor (DEXCOM G6 SENSOR) MISC 4 Pieces by Does not apply route once a week.   Continuous Blood Gluc Transmit (DEXCOM G6 TRANSMITTER) MISC    Continuous Blood Gluc Transmit (DEXCOM G6 TRANSMITTER) MISC 1 Piece by Does not apply route as directed.   desvenlafaxine (PRISTIQ) 50 MG 24 hr tablet Take 50 mg by mouth every morning.   Desvenlafaxine Succinate ER 25 MG TB24 Take 25 mg by mouth daily.   diclofenac Sodium (VOLTAREN) 1 % GEL Apply 1 application topically 3 (three) times daily.   gabapentin (NEURONTIN) 300 MG capsule Take 300-600 mg by mouth See admin instructions. Take 1 capsule (300 mg) by mouth in the morning & take 2 capsules (600 mg) by mouth at night   HUMALOG 100 UNIT/ML injection Inject 10-13 Units into the skin 3 (three) times daily with meals. Sliding Scale Insulin   LANTUS SOLOSTAR 100 UNIT/ML Solostar Pen Inject 24 Units into the skin at bedtime.   loratadine (CLARITIN) 10 MG tablet Take 10 mg by mouth daily.    losartan (COZAAR) 50 MG tablet TAKE 1 TABLET BY MOUTH EVERY DAY   mesalamine (CANASA) 1000 MG suppository Place 1 suppository (1,000 mg total) rectally at  bedtime. Take 3x/week.   Mesalamine 800 MG TBEC TAKE TWO (2) TABLETS BY MOUTH IN THE MORNING, AT NOON, AND AT BEDTIME   omeprazole (PRILOSEC) 20 MG capsule TAKE ONE CAPSULE BY MOUTH TWICE DAILY BEFORE A MEAL   ondansetron (ZOFRAN-ODT) 8 MG disintegrating tablet Take 8 mg by mouth every  8 (eight) hours as needed.   traZODone (DESYREL) 50 MG tablet Take 25 mg by mouth at bedtime.   VITAMIN D PO Take by mouth daily.   Facility-Administered Encounter Medications as of 12/09/2020  Medication   0.9 %  sodium chloride infusion   0.9 %  sodium chloride infusion    ALLERGIES: Allergies  Allergen Reactions   Ace Inhibitors Itching   Peanut Oil Itching   Shellfish Allergy Itching and Rash   Statins Itching   Adhesive [Tape] Rash   Pravastatin Sodium Itching   Pedi-Pre Tape Spray [Wound Dressing Adhesive]    Shellfish-Derived Products     VACCINATION STATUS: Immunization History  Administered Date(s) Administered   Influenza,inj,Quad PF,6+ Mos 10/22/2015   Influenza-Unspecified 10/16/2019   Pneumococcal Polysaccharide-23 01/03/2006   Td 10/04/2006   Tdap 07/10/1996    Diabetes She presents for her follow-up diabetic visit. She has type 1 diabetes mellitus. Onset time: She was diagnosed at approximate age of 67 years. Her disease course has been improving. Pertinent negatives for hypoglycemia include no confusion, nervousness/anxiousness, pallor or seizures. Associated symptoms include fatigue, polydipsia and polyuria. Pertinent negatives for diabetes include no chest pain and no polyphagia. There are no hypoglycemic complications. Symptoms are improving. There are no diabetic complications. Risk factors for coronary artery disease include diabetes mellitus, dyslipidemia, obesity and sedentary lifestyle. Current diabetic treatment includes insulin injections. Her weight is stable. She is following a generally unhealthy diet. When asked about meal planning, she reported none. She has not had a  previous visit with a dietitian. Her home blood glucose trend is fluctuating minimally. Her breakfast blood glucose range is generally 140-180 mg/dl. Her lunch blood glucose range is generally 140-180 mg/dl. Her dinner blood glucose range is generally 140-180 mg/dl. Her bedtime blood glucose range is generally 140-180 mg/dl. Her overall blood glucose range is 140-180 mg/dl. (Brittney [name] presents with her CGM device as well as her logs.  Her AGP report shows 47% time range, 32% slightly above range.  She has 5% hypoglycemia.  Her average blood glucose recently is 180, point-of-care A1c today is 7.6 percent, overall progressively improving from 9%..  Hypoglycemia rate is due to her Humalog dosing errors.  ) An ACE inhibitor/angiotensin II receptor blocker is not being taken.  Hyperlipidemia This is a chronic problem. The current episode started more than 1 year ago. The problem is controlled. Pertinent negatives include no chest pain, myalgias or shortness of breath. Current antihyperlipidemic treatment includes statins (She was treated with atorvastatin in the past, did not continue on this medication complaining about cost.). Risk factors for coronary artery disease include diabetes mellitus, dyslipidemia, obesity and family history.   Review of systems  Constitutional: + Minimally fluctuating body weight,  current  Body mass index is 35.6 kg/m. , no fatigue, no subjective hyperthermia, no subjective hypothermia    Objective:    BP 136/78   Pulse 80   Ht 5' 1"  (1.549 m)   Wt 188 lb 6.4 oz (85.5 kg)   LMP 09/04/2015 (Approximate)   BMI 35.60 kg/m   Wt Readings from Last 3 Encounters:  12/09/20 188 lb 6.4 oz (85.5 kg)  09/22/20 186 lb (84.4 kg)  07/20/20 186 lb 6.4 oz (84.6 kg)      Physical Exam- Limited  Constitutional:  Body mass index is 35.6 kg/m. , not in acute distress, normal state of mind    Recent Results (from the past 2160 hour(s))  HgB A1c     Status: Abnormal  Collection Time: 12/09/20 10:05 AM  Result Value Ref Range   Hemoglobin A1C     HbA1c POC (<> result, manual entry)     HbA1c, POC (prediabetic range)     HbA1c, POC (controlled diabetic range) 7.6 (A) 0.0 - 7.0 %     Diabetic Labs (most recent): Lab Results  Component Value Date   HGBA1C 7.6 (A) 12/09/2020   HGBA1C 7.9 (A) 06/09/2020   HGBA1C 7.9 06/09/2020     Lipid Panel ( most recent) Lipid Panel     Component Value Date/Time   CHOL 154 08/29/2019 0942   TRIG 49 08/29/2019 0942   HDL 67 08/29/2019 0942   CHOLHDL 2.3 08/29/2019 0942   LDLCALC 73 08/29/2019 0942   CMP Latest Ref Rng & Units 06/23/2020 06/03/2020 12/06/2019  Glucose 70 - 99 mg/dL 258(H) 168(H) 87  BUN 6 - 20 mg/dL 10 11 9   Creatinine 0.44 - 1.00 mg/dL 0.61 0.69 0.52  Sodium 135 - 145 mmol/L 138 145(H) 140  Potassium 3.5 - 5.1 mmol/L 3.4(L) 4.4 3.8  Chloride 98 - 111 mmol/L 102 106 103  CO2 22 - 32 mmol/L 29 27 29   Calcium 8.9 - 10.3 mg/dL 9.3 9.5 9.4  Total Protein 6.5 - 8.1 g/dL 6.8 6.7 7.0  Total Bilirubin 0.3 - 1.2 mg/dL 0.5 0.6 1.1  Alkaline Phos 38 - 126 U/L 97 126(H) 102  AST 15 - 41 U/L 24 17 22   ALT 0 - 44 U/L 24 15 28      Assessment & Plan:   1. Uncontrolled type 1 diabetes mellitus with hyperglycemia (HCC)  - Oakley S Bradby has currently uncontrolled symptomatic type 2 DM since 50 years of age.  Brittney [name] presents with her CGM device as well as her logs.  Her AGP report shows 47% time range, 32% slightly above range.  She has 5% hypoglycemia.  Her average blood glucose recently is 180, point-of-care A1c today is 7.6 percent, overall progressively improving from 9%..  Hypoglycemia rate is due to her Humalog dosing errors.     Her recent anti-GAD antibody elevated at 61 suggesting type 1 diabetes.   -her diabetes is complicated by obesity/sedentary life and she remains at a high risk for more acute and chronic complications which include CAD, CVA, CKD, retinopathy, and neuropathy.  These are all discussed in detail with her.  - I have counseled her on diet management and weight loss, by adopting a carbohydrate restricted/protein rich diet.  - she acknowledges that there is a room for improvement in her food and drink choices. - Suggestion is made for her to avoid simple carbohydrates  from her diet including Cakes, Sweet Desserts, Ice Cream, Soda (diet and regular), Sweet Tea, Candies, Chips, Cookies, Store Bought Juices, Alcohol in Excess of  1-2 drinks a day, Artificial Sweeteners,  Coffee Creamer, and "Sugar-free" Products, Lemonade. This will help patient to have more stable blood glucose profile and potentially avoid unintended weight gain.   - I encouraged her to switch to  unprocessed or minimally processed complex starch and increased protein intake (animal or plant source), fruits, and vegetables.  - she is advised to stick to a routine mealtimes to eat 3 meals  a day and avoid unnecessary snacks ( to snack only to correct hypoglycemia).    - I have approached her with the following individualized plan to manage diabetes and patient agrees:   -She will continue to need intensive treatment with basal/bolus insulin  in order for her  to achieve and maintain control of diabetes to target.    -She presents with more steady and stable glycemic profile.   -She is advised to continue Lantus 24 units nightly, lower her Humalog to 10 units 3 times a day with meals  for pre-meal BG readings of 80-172m/dl, plus patient specific correction dose for unexpected hyperglycemia above 1548mdl, associated with strict monitoring of glucose 4 times a day-before meals and at bedtime.  - she is warned not to take insulin without proper monitoring per orders. - Adjustment parameters are given to her for hypo and hyperglycemia in writing. - she is encouraged to call clinic for blood glucose levels less than 70 or above 200 mg /dl. -She is benefiting from the Dexcom CGM.  She is  encouraged to use it at all times.   -She is  advised to adjust her Humalog on days that she was to exercise.  - she is not a suitable candidate  for metformin, SGLT2 inhibitors, nor incretin therapy.   - Patient specific target  A1c;  LDL, HDL, Triglycerides, were discussed in detail.  2) Blood Pressure /Hypertension:  -Her blood pressure is controlled to target.  She has urine microalbuminuria.  She is currently  on losartan 50 mg daily.     3) Lipids/Hyperlipidemia: Review of her most recent lipid panel showed LDL improved to 76 from 119.  She is benefiting from atorvastatin, advised to continue atorvastatin 20 mg p.o. nightly.          4)  Weight/Diet: Her BMI is 3593.2  clearly complicating her diabetes care.  A candidate for modest weight loss, may avoid further weight gain by avoiding unnecessary snacks.  I discussed with her the fact that loss of 5 - 10% of her  current body weight will have the most impact on her diabetes management.   CDE Consult will be initiated . Exercise, and detailed carbohydrates information provided  -  detailed on discharge instructions.  5) Chronic Care/Health Maintenance:  -she  Is not on ACEI/ARB and Statin medications and  is encouraged to initiate and continue to follow up with Ophthalmology, Dentist,  Podiatrist at least yearly or according to recommendations, and advised to   stay away from smoking. I have recommended yearly flu vaccine and pneumonia vaccine at least every 5 years; moderate intensity exercise for up to 150 minutes weekly; and  sleep for at least 7 hours a day.  - she is  advised to maintain close follow up with DaCaryl BisMD for primary care needs, as well as her other providers for optimal and coordinated care.    I spent 42 minutes in the care of the patient today including [facility], Thyroid Function, Hematology (current and previous including abstractions from other facilities); face-to-face time  discussing  her blood glucose readings/logs, discussing hypoglycemia and hyperglycemia episodes and symptoms, medications doses, her options of short and long term treatment based on the latest standards of care / guidelines;  discussion about incorporating lifestyle medicine;  and documenting the encounter.    Please refer to Patient Instructions for Blood Glucose Monitoring and Insulin/Medications Dosing Guide"  in media tab for additional information. Please  also refer to " Patient Self Inventory" in the Media  tab for reviewed elements of pertinent patient history.  AnMarliss Czararticipated in the discussions, expressed understanding, and voiced agreement with the above plans.  All questions were answered to her satisfaction. she is encouraged to  contact clinic should she have any questions or concerns prior to her return visit.   Follow up plan: - Return in about 6 months (around 06/09/2021) for Bring Meter and Logs- A1c in Office.  Glade Lloyd, MD [facility] [address] [place], [place] [address] Phone: [phone]  Fax: [phone]    12/09/2020, 12:23 PM  This note was partially dictated with voice recognition software. Similar sounding words can be transcribed inadequately or may not  be corrected upon review.

## 2020-12-14 ENCOUNTER — Other Ambulatory Visit (INDEPENDENT_AMBULATORY_CARE_PROVIDER_SITE_OTHER): Payer: Self-pay | Admitting: Internal Medicine

## 2021-01-05 ENCOUNTER — Encounter (HOSPITAL_COMMUNITY): Payer: Self-pay | Admitting: Hematology

## 2021-01-13 ENCOUNTER — Telehealth: Payer: Self-pay

## 2021-01-13 NOTE — Telephone Encounter (Signed)
Sent in [place] request for Dexcom G6 sensors.

## 2021-01-20 ENCOUNTER — Other Ambulatory Visit (HOSPITAL_COMMUNITY): Payer: Medicaid Other

## 2021-01-20 ENCOUNTER — Inpatient Hospital Stay (HOSPITAL_COMMUNITY): Payer: Medicaid Other | Attending: Physician Assistant

## 2021-01-20 DIAGNOSIS — K5190 Ulcerative colitis, unspecified, without complications: Secondary | ICD-10-CM | POA: Diagnosis not present

## 2021-01-20 DIAGNOSIS — E109 Type 1 diabetes mellitus without complications: Secondary | ICD-10-CM | POA: Insufficient documentation

## 2021-01-20 DIAGNOSIS — Z803 Family history of malignant neoplasm of breast: Secondary | ICD-10-CM | POA: Insufficient documentation

## 2021-01-20 DIAGNOSIS — D500 Iron deficiency anemia secondary to blood loss (chronic): Secondary | ICD-10-CM | POA: Insufficient documentation

## 2021-01-20 DIAGNOSIS — E538 Deficiency of other specified B group vitamins: Secondary | ICD-10-CM | POA: Insufficient documentation

## 2021-01-20 DIAGNOSIS — I890 Lymphedema, not elsewhere classified: Secondary | ICD-10-CM | POA: Insufficient documentation

## 2021-01-20 DIAGNOSIS — Z171 Estrogen receptor negative status [ER-]: Secondary | ICD-10-CM | POA: Insufficient documentation

## 2021-01-20 DIAGNOSIS — C50411 Malignant neoplasm of upper-outer quadrant of right female breast: Secondary | ICD-10-CM | POA: Diagnosis not present

## 2021-01-20 DIAGNOSIS — Z807 Family history of other malignant neoplasms of lymphoid, hematopoietic and related tissues: Secondary | ICD-10-CM | POA: Insufficient documentation

## 2021-01-20 DIAGNOSIS — E010 Iodine-deficiency related diffuse (endemic) goiter: Secondary | ICD-10-CM | POA: Diagnosis not present

## 2021-01-20 DIAGNOSIS — Z800 Family history of malignant neoplasm of digestive organs: Secondary | ICD-10-CM | POA: Insufficient documentation

## 2021-01-20 LAB — COMPREHENSIVE METABOLIC PANEL
ALT: 17 U/L (ref 0–44)
AST: 20 U/L (ref 15–41)
Albumin: 3.8 g/dL (ref 3.5–5.0)
Alkaline Phosphatase: 104 U/L (ref 38–126)
Anion gap: 10 (ref 5–15)
BUN: 16 mg/dL (ref 6–20)
CO2: 27 mmol/L (ref 22–32)
Calcium: 8.9 mg/dL (ref 8.9–10.3)
Chloride: 104 mmol/L (ref 98–111)
Creatinine, Ser: 0.83 mg/dL (ref 0.44–1.00)
GFR, Estimated: >60 mL/min (ref >60)
Glucose, Bld: 195 mg/dL — ABNORMAL HIGH (ref 70–99)
Potassium: 4 mmol/L (ref 3.5–5.1)
Sodium: 141 mmol/L (ref 135–145)
Total Bilirubin: 0.5 mg/dL (ref 0.3–1.2)
Total Protein: 6.7 g/dL (ref 6.5–8.1)

## 2021-01-20 LAB — RETIC PANEL
Immature Retic Fract: 11.1 % (ref 2.3–15.9)
RBC.: 4.5 MIL/uL (ref 3.87–5.11)
Retic Count, Absolute: 65.7 10*3/uL (ref 19.0–186.0)
Retic Ct Pct: 1.5 % (ref 0.4–3.1)
Reticulocyte Hemoglobin: 30 pg (ref >27.9)

## 2021-01-20 LAB — CBC WITH DIFFERENTIAL/PLATELET
Abs Immature Granulocytes: 0 10*3/uL (ref 0.00–0.07)
Basophils Absolute: 0 10*3/uL (ref 0.0–0.1)
Basophils Relative: 0 %
Eosinophils Absolute: 0 10*3/uL (ref 0.0–0.5)
Eosinophils Relative: 0 %
HCT: 40.3 % (ref 36.0–46.0)
Hemoglobin: 12.5 g/dL (ref 12.0–15.0)
Immature Granulocytes: 0 %
Lymphocytes Relative: 28 %
Lymphs Abs: 1.1 10*3/uL (ref 0.7–4.0)
MCH: 27.8 pg (ref 26.0–34.0)
MCHC: 31 g/dL (ref 30.0–36.0)
MCV: 89.6 fL (ref 80.0–100.0)
Monocytes Absolute: 0.3 10*3/uL (ref 0.1–1.0)
Monocytes Relative: 7 %
Neutro Abs: 2.5 10*3/uL (ref 1.7–7.7)
Neutrophils Relative %: 65 %
Platelets: 165 10*3/uL (ref 150–400)
RBC: 4.5 MIL/uL (ref 3.87–5.11)
RDW: 14.1 % (ref 11.5–15.5)
WBC: 3.9 10*3/uL — ABNORMAL LOW (ref 4.0–10.5)
nRBC: 0 % (ref 0.0–0.2)

## 2021-01-20 LAB — IRON AND TIBC
Iron: 73 ug/dL (ref 28–170)
Saturation Ratios: 22 % (ref 10.4–31.8)
TIBC: 339 ug/dL (ref 250–450)
UIBC: 266 ug/dL

## 2021-01-20 LAB — VITAMIN D 25 HYDROXY (VIT D DEFICIENCY, FRACTURES): Vit D, 25-Hydroxy: 45.08 ng/mL (ref 30–100)

## 2021-01-20 LAB — FERRITIN: Ferritin: 165 ng/mL (ref 11–307)

## 2021-01-20 NOTE — Telephone Encounter (Signed)
Pt called and states medicaid denied her because they told her they did not receive any information from [place]

## 2021-01-21 NOTE — Telephone Encounter (Signed)
Resubmitted PA for Dexcom G6 sensors and transmitter with pt's last office visit note, labs and logs.

## 2021-01-27 ENCOUNTER — Ambulatory Visit (HOSPITAL_COMMUNITY): Payer: Medicaid Other | Admitting: Physician Assistant

## 2021-01-27 ENCOUNTER — Ambulatory Visit (HOSPITAL_COMMUNITY): Payer: Medicaid Other | Admitting: Hematology

## 2021-01-27 NOTE — Progress Notes (Addendum)
[place], [place] [address]   CLINIC:  Medical Oncology/Hematology  PCP:  [name], MD [address] 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  CURRENT THERAPY: Surveillance and intermittent iron infusions  BRIEF ONCOLOGIC HISTORY:  Oncology History  Malignant neoplasm of upper-outer quadrant of right female breast ([place])  04/29/2015 Initial Biopsy   upper outer quadrant mass R breast 2.9 cm by ultrasound, high grade invasive ductal carcinoma ER< 1%, PR 1%, HER -2 not amplified, Ki-67 66%   05/13/2015 Cancer Staging   pT1cN0   05/13/2015 Surgery   lumpectomy and sentinel node biopsies X 3, 2 cm tumor, 3 negative nodes    05/21/2015 Imaging   CT [facility] with postsurgical changes related to R breast lympectomy and R axillary LN dissection, no findings suspicious for metastatic disease   06/12/2015 Procedure   Port placement by Dr. Anthony Sar   06/17/2015 Imaging   MUGA- Normal left ventricular ejection fraction equal to 62.9%.   06/25/2015 - 08/13/2015 Chemotherapy   AC x 4   08/27/2015 - 09/03/2015 Chemotherapy   Weekly Taxol x 2 cycles    09/11/2015 - 09/15/2015 Hospital Admission   Neutropenic fever, ARF secondary to antibiotics   10/01/2015 - 11/12/2015 Chemotherapy   Taxotere 75 mg/m2 Q3 weeks x 3 cycles     10/01/2015 Treatment Plan Change   Taxol changed to Taxotere given hospitalization.     - 01/14/2016 Radiation Therapy   Radiation in [facility]   05/11/2016 Mammogram   IMPRESSION: No evidence of malignancy within either breast. Expected postsurgical changes within the right breast.      CANCER STAGING: Cancer Staging  Malignant neoplasm of upper-outer quadrant of right  female breast [facility]) Staging form: Breast, AJCC 7th Edition - Clinical stage from 05/13/2015: Stage IA (T1c, N0, M0) - Signed by Baird Cancer, PA-C on 06/25/2015   INTERVAL HISTORY:  Brittney [name], a 51 y.o. female, returns for routine follow-up of her history of right-sided breast cancer and iron deficiency anemia. Brittney [name] was last seen on 07/20/2020 by Dede Query PA-C.   At today's visit, she reports feeling well.  She denies any recent hospitalizations, surgeries, or changes in her baseline health status.  Most recent mammogram on 06/23/2020 showed no mammographic evidence of malignancy in either breast.  She continues to have occasional lymphedema of her right arm, and continues to use her lymphedema sleeve and pump at home.  She has not had any right breast pain or paresthesia.  She denies any symptoms of recurrence such as new lumps, bone pain, chest pain, dyspnea, or abdominal pain.  She has no new headaches, seizures, or focal neurologic deficits.  No B symptoms such as fever, chills, night sweats, unintentional weight loss.  Regarding her iron deficiency anemia, she reports that her energy level is at baseline without any significant fatigue.  She denies any signs of blood loss such as hematemesis, hematochezia, melena, or epistaxis.  She denies any pica, restless legs, or headaches.  She reports 70% energy and 100% appetite.  She is maintaining stable weight at this time.    REVIEW OF SYSTEMS:  Review of Systems  Constitutional:  Positive for fatigue (70% energy,  baseline). Negative for appetite change, chills, diaphoresis, fever and unexpected weight change.  HENT:   Negative for lump/mass and nosebleeds.   Eyes:  Negative for eye problems.  Respiratory:  Negative for cough, hemoptysis and shortness of breath.   Cardiovascular:  Negative for chest pain, leg swelling and palpitations.  Gastrointestinal:  Positive for abdominal pain (Occasional), nausea (Occasional)  and vomiting (Occasional). Negative for blood in stool, constipation and diarrhea.  Genitourinary:  Negative for hematuria.   Skin: Negative.   Neurological:  Positive for headaches (Occasional). Negative for dizziness and light-headedness.  Hematological:  Does not bruise/bleed easily.  Psychiatric/Behavioral:  Positive for depression. The patient is nervous/anxious.    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) [facility]    Past Surgical History:  Procedure Laterality Date   BIOPSY  12/05/2018   Procedure: BIOPSY;  Surgeon: Rogene Houston, MD;  [facility];  Service: Endoscopy;;  duodenum gastric colon   BREAST LUMPECTOMY Right 2017   CESAREAN SECTION     COLONOSCOPY N/A 10/09/2013   Procedure: COLONOSCOPY;  Surgeon: Rogene Houston, MD;  Location[facility];  Service: Endoscopy;  Laterality: N/A;  100   COLONOSCOPY N/A 12/05/2018   Procedure: COLONOSCOPY;  Surgeon: Rogene Houston, MD;  [facility];  Service: Endoscopy;  Laterality: N/A;   DILATION AND CURETTAGE OF UTERUS     x 2 for miscarriages   ESOPHAGOGASTRODUODENOSCOPY     ESOPHAGOGASTRODUODENOSCOPY N/A 12/05/2018   Procedure: ESOPHAGOGASTRODUODENOSCOPY (EGD);  Surgeon: Rogene Houston, MD;  [facility];  Service: Endoscopy;  Laterality: N/A;  200pm   FLEXIBLE SIGMOIDOSCOPY     FLEXIBLE SIGMOIDOSCOPY  07/27/2011   Procedure: FLEXIBLE SIGMOIDOSCOPY;  Surgeon: Rogene Houston, MD;  [facility]  [facility];  Service: Endoscopy;  Laterality: N/A;  100    SOCIAL HISTORY:  Social History   Socioeconomic History   Marital status: Single    Spouse name: Not on file   Number of children: Not on file   Years of education: Not on file   Highest education level: Not on file  Occupational History   Not on file  Tobacco Use   Smoking status: Never   Smokeless tobacco: Never  Vaping Use   Vaping Use: Never used  Substance and Sexual Activity   Alcohol use: No    Alcohol/week: 0.0 standard drinks   Drug use: No   Sexual activity: Not on file  Other Topics Concern   Not on file  Social History Narrative   Not on file   Social Determinants of Health   Financial Resource Strain: Not on file  Food Insecurity: Not on file  Transportation Needs: Not on file  Physical Activity: Not on file  Stress: Not on file  Social Connections: Not on file  Intimate Partner Violence: Not on file    FAMILY HISTORY:  Family History  Problem Relation Age of Onset  Non-Hodgkin's lymphoma Mother    Hypertension Mother    Diabetes Mellitus II Mother    Hypertension Father    Diabetes Mellitus II Father    Breast cancer Cousin    Breast cancer Maternal Aunt    Colon cancer Maternal Uncle     CURRENT MEDICATIONS:  Current Outpatient Medications  Medication Sig Dispense Refill   ACCU-CHEK AVIVA PLUS test strip SMARTSIG:Via Meter 6 Times Daily     acetaminophen (TYLENOL) 500 MG tablet Take 1,000 mg by mouth every 6 (six) hours as needed. For pain     acyclovir (ZOVIRAX) 200 MG capsule Take 400 mg by mouth daily. Reported on 07/23/2015     albuterol (PROVENTIL HFA;VENTOLIN HFA) 108 (90 Base) MCG/ACT inhaler Inhale 2 puffs into the lungs every 4 (four) hours as needed for wheezing or shortness of breath.     ARIPiprazole (ABILIFY) 30 MG tablet Take 30 mg by mouth in the morning.     atorvastatin (LIPITOR) 20 MG tablet Take 20 mg by mouth daily.      Continuous Blood Gluc Receiver (DEXCOM  G6 RECEIVER) DEVI 1 Piece by Does not apply route as needed. 1 each 0   Continuous Blood Gluc Sensor (DEXCOM G6 SENSOR) MISC SMARTSIG:1 Each Topical Every 10 Days 3 each 2   Continuous Blood Gluc Sensor (DEXCOM G6 SENSOR) MISC 4 Pieces by Does not apply route once a week. 4 each 2   Continuous Blood Gluc Transmit (DEXCOM G6 TRANSMITTER) MISC      Continuous Blood Gluc Transmit (DEXCOM G6 TRANSMITTER) MISC 1 Piece by Does not apply route as directed. 1 each 1   desvenlafaxine (PRISTIQ) 50 MG 24 hr tablet Take 50 mg by mouth every morning.     Desvenlafaxine Succinate ER 25 MG TB24 Take 25 mg by mouth daily.     diclofenac Sodium (VOLTAREN) 1 % GEL Apply 1 application topically 3 (three) times daily.     gabapentin (NEURONTIN) 300 MG capsule Take 300-600 mg by mouth See admin instructions. Take 1 capsule (300 mg) by mouth in the morning & take 2 capsules (600 mg) by mouth at night  6   HUMALOG 100 UNIT/ML injection Inject 10-13 Units into the skin 3 (three) times daily with meals. Sliding Scale Insulin  6   LANTUS SOLOSTAR 100 UNIT/ML Solostar Pen Inject 24 Units into the skin at bedtime.  6   loratadine (CLARITIN) 10 MG tablet Take 10 mg by mouth daily.   6   losartan (COZAAR) 50 MG tablet TAKE 1 TABLET BY MOUTH EVERY DAY 90 tablet 0   mesalamine (CANASA) 1000 MG suppository Place 1 suppository (1,000 mg total) rectally at bedtime. Take 3x/week. 30 suppository 5   Mesalamine 800 MG TBEC TAKE TWO (2) TABLETS BY MOUTH IN THE MORNING, AT NOON, AND AT BEDTIME 180 tablet 5   omeprazole (PRILOSEC) 20 MG capsule TAKE ONE CAPSULE BY MOUTH TWICE DAILY BEFORE A MEAL 60 capsule 2   ondansetron (ZOFRAN-ODT) 8 MG disintegrating tablet Take 8 mg by mouth every 8 (eight) hours as needed.     traZODone (DESYREL) 50 MG tablet Take 25 mg by mouth at bedtime.     VITAMIN D PO Take by mouth daily.     No current facility-administered medications for this visit.   Facility-Administered Medications Ordered in Other  Visits  Medication Dose Route Frequency Provider Last Rate Last Admin   0.9 %  sodium chloride infusion   Intravenous Continuous Kefalas, Manon Hilding, PA-C  0.9 %  sodium chloride infusion   Intravenous Continuous Higgs, Vetta, MD   Stopped at 05/26/17 1510    ALLERGIES:  Allergies  Allergen Reactions   Ace Inhibitors Itching   Peanut Oil Itching   Shellfish Allergy Itching and Rash   Statins Itching   Adhesive [Tape] Rash   Pravastatin Sodium Itching   Pedi-Pre Tape Spray [Wound Dressing Adhesive]    Shellfish-Derived Products     PHYSICAL EXAM:  Performance status (ECOG): 0 - Asymptomatic  There were no vitals filed for this visit. Wt Readings from Last 3 Encounters:  12/09/20 188 lb 6.4 oz (85.5 kg)  09/22/20 186 lb (84.4 kg)  07/20/20 186 lb 6.4 oz (84.6 kg)   Physical Exam Constitutional:      Appearance: Normal appearance. She is obese.  HENT:     Head: Normocephalic and atraumatic.     Mouth/Throat:     Mouth: Mucous membranes are moist.  Eyes:     Extraocular Movements: Extraocular movements intact.     Pupils: Pupils are equal, round, and reactive to light.  Neck:     Thyroid: Thyromegaly present.  Cardiovascular:     Rate and Rhythm: Normal rate and regular rhythm.     Pulses: Normal pulses.     Heart sounds: Normal heart sounds.  Pulmonary:     Effort: Pulmonary effort is normal.     Breath sounds: Normal breath sounds.  Chest:       Comments: Scar tissue palpated beneath lumpectomy scar and lymph node biopsy scar.  Otherwise, no discrete nodules or masses palpated on exam. Abdominal:     General: Bowel sounds are normal.     Palpations: Abdomen is soft.     Tenderness: There is no abdominal tenderness.  Musculoskeletal:        General: No swelling.     Right lower leg: No edema.     Left lower leg: No edema.  Lymphadenopathy:     Cervical: No cervical adenopathy.     Upper Body:     Right upper body: No supraclavicular, axillary or pectoral  adenopathy.     Left upper body: No supraclavicular, axillary or pectoral adenopathy.  Skin:    General: Skin is warm and dry.  Neurological:     General: No focal deficit present.     Mental Status: She is alert and oriented to person, place, and time.  Psychiatric:        Mood and Affect: Mood normal.        Behavior: Behavior normal.     LABORATORY DATA:  I have reviewed the labs as listed.  CBC Latest Ref Rng & Units 01/20/2021 06/23/2020 03/11/2020  WBC 4.0 - 10.5 K/uL 3.9(L) 4.1 4.8  Hemoglobin 12.0 - 15.0 g/dL 12.5 12.7 12.8  Hematocrit 36.0 - 46.0 % 40.3 40.4 41.4  Platelets 150 - 400 K/uL 165 154 168   CMP Latest Ref Rng & Units 01/20/2021 06/23/2020 06/03/2020  Glucose 70 - 99 mg/dL 195(H) 258(H) 168(H)  BUN 6 - 20 mg/dL 16 10 11   Creatinine 0.44 - 1.00 mg/dL 0.83 0.61 0.69  Sodium 135 - 145 mmol/L 141 138 145(H)  Potassium 3.5 - 5.1 mmol/L 4.0 3.4(L) 4.4  Chloride 98 - 111 mmol/L 104 102 106  CO2 22 - 32 mmol/L 27 29 27   Calcium 8.9 - 10.3 mg/dL 8.9 9.3 9.5  Total Protein 6.5 - 8.1 g/dL 6.7 6.8 6.7  Total Bilirubin 0.3 - 1.2 mg/dL 0.5 0.5  0.6  Alkaline Phos 38 - 126 U/L 104 97 126(H)  AST 15 - 41 U/L 20 24 17   ALT 0 - 44 U/L 17 24 15     DIAGNOSTIC IMAGING:  I have independently reviewed the scans and discussed with the patient. No results found.   ASSESSMENT & PLAN: 1.  Stage Ia triple negative breast cancer of the right breast - Status post lumpectomy and sentinel lymph node biopsy by Dr. Anthony Sar on 05/13/2015. - Status post adjuvant chemotherapy with dose dense AC followed by 3 cycles of dose dense docetaxel from 06/25/2015-11/12/2015. - Last mammogram was dated 06/23/2020 which was BI-RADS Category 2 benign; no mammographic evidence of malignancy in either breast - Physical examination today is within normal limits lumpectomy scar in the right breast upper outer quadrant is stable.  No palpable masses.  - No alarming symptoms per patient report  - Most recent labs  (01/20/2021): CBC unremarkable, CMP at baseline with normal kidney function and normal LFTs.  Normal vitamin D 45.08. - PLAN: Next mammogram due June 2023.  Orders placed. - Repeat labs, physical exam, and office visit in July 2023, after mammogram.     2.  Iron deficiency state -This is from intermittent blood loss from ulcerative colitis. - She occasionally has episodes of bleeding per rectum secondary to ulcerative colitis. - Last Feraheme was on 06/13/2019 and 06/20/2019. - Most recent labs (01/20/2021): Hemoglobin 12.5, ferritin 165, iron saturation 22% - PLAN: No indication for IV iron at this time.  Repeat labs at follow-up visit in 6 months.  3.  Vitamin B12 deficiency - She was previously taking vitamin B12 cyanocobalamin 1 mg daily, but stopped taking it for unknown reasons - PLAN:  We will check B12/methylmalonic acid at follow-up visit in 6 months.  4.  Other history - PMH: Ulcerative colitis (followed closely by GI), depression, insomnia, type 1 diabetes  5.  Thyromegaly - Visible and palpable thyromegaly on exam - PLAN: Patient has an upcoming appoint with endocrinology, and has been advised to speak with her endocrinologist regarding her thyroid.   PLAN SUMMARY & DISPOSITION: Mammogram + labs (CBC, CMP, iron panel, B12/methylmalonic acid) in June 2023 Office visit in July 2023  All questions were answered. The patient knows to call the clinic with any problems, questions or concerns.  Medical decision making: Moderate  Time spent on visit: I spent 20 minutes counseling the patient face to face. The total time spent in the appointment was 30 minutes and more than 50% was on counseling.   Harriett Rush, PA-C  01/28/2021 1:16 PM

## 2021-01-28 ENCOUNTER — Other Ambulatory Visit: Payer: Self-pay

## 2021-01-28 ENCOUNTER — Inpatient Hospital Stay (HOSPITAL_BASED_OUTPATIENT_CLINIC_OR_DEPARTMENT_OTHER): Payer: Medicaid Other | Admitting: Physician Assistant

## 2021-01-28 VITALS — BP 144/82 | HR 76 | Temp 96.0°F | Resp 18 | Ht 61.81 in | Wt 188.9 lb

## 2021-01-28 DIAGNOSIS — Z171 Estrogen receptor negative status [ER-]: Secondary | ICD-10-CM

## 2021-01-28 DIAGNOSIS — E538 Deficiency of other specified B group vitamins: Secondary | ICD-10-CM | POA: Diagnosis not present

## 2021-01-28 DIAGNOSIS — D500 Iron deficiency anemia secondary to blood loss (chronic): Secondary | ICD-10-CM | POA: Diagnosis not present

## 2021-01-28 DIAGNOSIS — C50411 Malignant neoplasm of upper-outer quadrant of right female breast: Secondary | ICD-10-CM | POA: Diagnosis not present

## 2021-01-28 NOTE — Patient Instructions (Signed)
[place] at [facility] Discharge Instructions  You were seen today by Tarri Abernethy PA-C for the following conditions.  HISTORY OF BREAST CANCER: There were no abnormalities on physical exam.  Your lab work looked great!  You will be due for another mammogram in June 2023, and we will see you for follow-up visit after mammogram.  IRON DEFICIENCY: Your iron and blood levels look good.  You do not need any IV iron at this time.  We will check your levels again at your follow-up visit in 6 months.  LABS: Return in 6 months for repeat labs  OTHER TESTS: Mammogram in June 2023  MEDICATIONS: No changes to home medications  FOLLOW-UP APPOINTMENT: Office visit in 6 months, after labs and mammogram   Thank you for choosing [place] at [facility] to provide your oncology and hematology care.  To afford each patient quality time with our provider, please arrive at least 15 minutes before your scheduled appointment time.   If you have a lab appointment with the [facility] please come in thru the Main Entrance and check in at the main information desk.  You need to re-schedule your appointment should you arrive 10 or more minutes late.  We strive to give you quality time with our providers, and arriving late affects you and other patients whose appointments are after yours.  Also, if you no show three or more times for appointments you may be dismissed from the clinic at the providers discretion.     Again, thank you for choosing [facility].  Our hope is that these requests will decrease the amount of time that you wait before being seen by our physicians.       _____________________________________________________________  Should you have questions after your visit to [facility], please contact our office at [phone] and follow the prompts.  Our office hours are 8:00 a.m. and 4:30 p.m. Monday - Friday.  Please  note that voicemails left after 4:00 p.m. may not be returned until the following business day.  We are closed weekends and major holidays.  You do have access to a nurse 24-7, just call the main number to the clinic [phone] and do not press any options, hold on the line and a nurse will answer the phone.    For prescription refill requests, have your pharmacy contact our office and allow 72 hours.    Due to Covid, you will need to wear a mask upon entering the hospital. If you do not have a mask, a mask will be given to you at the Main Entrance upon arrival. For doctor visits, patients may have 1 support person age 89 or older with them. For treatment visits, patients can not have anyone with them due to social distancing guidelines and our immunocompromised population.

## 2021-02-19 ENCOUNTER — Other Ambulatory Visit (INDEPENDENT_AMBULATORY_CARE_PROVIDER_SITE_OTHER): Payer: Self-pay | Admitting: Internal Medicine

## 2021-03-22 ENCOUNTER — Ambulatory Visit: Payer: Medicaid Other | Admitting: "Endocrinology

## 2021-03-22 ENCOUNTER — Encounter: Payer: Self-pay | Admitting: "Endocrinology

## 2021-03-22 VITALS — BP 136/78 | HR 80 | Ht 61.0 in | Wt 183.0 lb

## 2021-03-22 DIAGNOSIS — E559 Vitamin D deficiency, unspecified: Secondary | ICD-10-CM

## 2021-03-22 DIAGNOSIS — E782 Mixed hyperlipidemia: Secondary | ICD-10-CM | POA: Diagnosis not present

## 2021-03-22 DIAGNOSIS — E1065 Type 1 diabetes mellitus with hyperglycemia: Secondary | ICD-10-CM

## 2021-03-22 LAB — POCT GLYCOSYLATED HEMOGLOBIN (HGB A1C): HbA1c, POC (controlled diabetic range): 8 % — AB (ref 0.0–7.0)

## 2021-03-22 MED ORDER — DEXCOM G6 TRANSMITTER MISC: 1 refills | Status: DC

## 2021-03-22 MED ORDER — DEXCOM G6 SENSOR MISC: 4.0000 | 2 refills | Status: DC

## 2021-03-22 NOTE — Progress Notes (Signed)
? ?                                                            ?     03/22/2021, 1:36 PM ? ?  ? ?Endocrinology follow-up note ? ?Subjective:  ? ? Patient ID: Brittney [name], female    DOB: November 26, 1970.  ?Brittney [name] is being seen in follow-up in the management of currently uncontrolled symptomatic type 1 diabetes, hypertension, hyperlipidemia. ?PMD: [name], MD. ? ? ?Past Medical History:  ?Diagnosis Date  ? Breast cancer ([place])   ? 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 Chemotherapy induced neutropenia ([place]) 09/10/2015  ? Diabetes mellitus ([place])   ? Type 1 over 15 yrs  ? Environmental allergies   ? GERD (gastroesophageal reflux disease)   ? Iron deficiency anemia due to chronic blood loss 06/12/2015  ? Personal history of chemotherapy   ? Personal history of radiation therapy   ? 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 ? ?Past Surgical History:  ?Procedure Laterality Date  ? BIOPSY  12/05/2018  ? Procedure: BIOPSY;  Surgeon: Rogene Houston, MD;  [facility]: [facility];  Service: Endoscopy;;  duodenum ?gastric ?colon  ? BREAST LUMPECTOMY Right 2017  ? CESAREAN SECTION    ? COLONOSCOPY N/A 10/09/2013  ? Procedure: COLONOSCOPY;  Surgeon: Rogene Houston, MD;  Location: [facility];  Service: Endoscopy;  Laterality: N/A;  100  ? COLONOSCOPY N/A 12/05/2018  ? Procedure: COLONOSCOPY;  Surgeon: Rogene Houston, MD;  [facility]: [facility];  Service: Endoscopy;  Laterality: N/A;  ? DILATION AND CURETTAGE OF UTERUS    ? x 2 for miscarriages  ? ESOPHAGOGASTRODUODENOSCOPY    ? ESOPHAGOGASTRODUODENOSCOPY N/A 12/05/2018  ? Procedure: ESOPHAGOGASTRODUODENOSCOPY (EGD);  Surgeon: Rogene Houston, MD;  Location: [facility];  Service: Endoscopy;  Laterality: N/A;  200pm  ? FLEXIBLE SIGMOIDOSCOPY    ? FLEXIBLE SIGMOIDOSCOPY  07/27/2011  ? Procedure: FLEXIBLE SIGMOIDOSCOPY;  Surgeon: Rogene Houston, MD;  Location: [facility];  Service: Endoscopy;  Laterality: N/A;  100  ? ? ?Social History  ? ?Socioeconomic History  ? Marital status: Single  ?  Spouse name: Not on file  ? Number of children: Not on file  ? Years of education: Not on file  ? Highest education level: Not on file  ?Occupational History  ? Not on file  ?Tobacco Use  ? Smoking status: Never  ? Smokeless tobacco: Never  ?Vaping Use  ? Vaping Use: Never used  ?Substance and Sexual Activity  ? Alcohol use: No  ?  Alcohol/week: 0.0 standard drinks  ? Drug use: No  ? Sexual activity: Not on file  ?Other Topics Concern  ? Not on file  ?Social History Narrative  ? Not on file  ? ?Social Determinants of Health  ? ?Financial Resource Strain: Not on file  ?Food Insecurity: Not on file  ?Transportation Needs: Not on file  ?Physical Activity: Not on file  ?Stress: Not on file  ?Social Connections: Not on file  ? ? ?Family History  ?Problem Relation Age of Onset  ? Non-Hodgkin's lymphoma Mother   ? Hypertension Mother   ? Diabetes  Mellitus II Mother   ? Hypertension Father   ? Diabetes Mellitus II Father   ? Breast cancer Cousin   ? Breast cancer Maternal Aunt   ? Colon cancer Maternal Uncle   ? ? ?Outpatient Encounter Medications as of 03/22/2021  ?Medication Sig  ? ACCU-CHEK AVIVA PLUS test strip SMARTSIG:Via Meter 6 Times Daily  ? Accu-Chek Softclix Lancets lancets 4 (four) times daily.  ? acetaminophen (TYLENOL) 500 MG tablet Take 1,000 mg by mouth every 6 (six) hours as needed. For pain  ? acyclovir (ZOVIRAX) 200 MG capsule Take 400 mg by mouth daily. Reported on 07/23/2015  ? albuterol (PROVENTIL HFA;VENTOLIN HFA) 108 (90 Base) MCG/ACT inhaler Inhale 2 puffs into the lungs every 4 (four) hours as needed for wheezing or shortness of breath.  ? ARIPiprazole (ABILIFY) 30 MG tablet Take 30 mg by mouth in the morning. (Patient not taking: Reported on 03/22/2021)  ? atorvastatin (LIPITOR) 20 MG tablet Take 20 mg by mouth daily.   ? Blood Glucose Monitoring Suppl (ACCU-CHEK GUIDE)  w/Device KIT USE TO CHECK SUGAR  ? buPROPion (WELLBUTRIN XL) 150 MG 24 hr tablet Take by mouth.  ? Continuous Blood Gluc Receiver (DEXCOM G6 RECEIVER) DEVI 1 Piece by Does not apply route as needed. (Patient not taking: Reported on 03/22/2021)  ? Continuous Blood Gluc Sensor (DEXCOM G6 SENSOR) MISC 4 Pieces by Does not apply route once a week.  ? Continuous Blood Gluc Transmit (DEXCOM G6 TRANSMITTER) MISC Change every 90 days  ? desvenlafaxine (PRISTIQ) 50 MG 24 hr tablet Take 50 mg by mouth every morning.  ? Desvenlafaxine Succinate ER 25 MG TB24 Take 25 mg by mouth daily.  ? diclofenac Sodium (VOLTAREN) 1 % GEL Apply 1 application topically 3 (three) times daily.  ? gabapentin (NEURONTIN) 300 MG capsule Take 300-600 mg by mouth See admin instructions. Take 1 capsule (300 mg) by mouth in the morning & take 2 capsules (600 mg) by mouth at night  ? HUMALOG 100 UNIT/ML injection Inject 10-13 Units into the skin 3 (three) times daily with meals. Sliding Scale Insulin  ? hydrOXYzine (ATARAX) 10 MG tablet Take 10 mg by mouth at bedtime.  ? LANTUS SOLOSTAR 100 UNIT/ML Solostar Pen Inject 24 Units into the skin at bedtime.  ? loratadine (CLARITIN) 10 MG tablet Take 10 mg by mouth daily.   ? losartan (COZAAR) 50 MG tablet TAKE 1 TABLET BY MOUTH EVERY DAY  ? mesalamine (CANASA) 1000 MG suppository Place 1 suppository (1,000 mg total) rectally at bedtime. Take 3x/week.  ? Mesalamine 800 MG TBEC TAKE TWO (2) TABLETS BY MOUTH IN THE MORNING, AT NOON, AND AT BEDTIME  ? omeprazole (PRILOSEC) 20 MG capsule TAKE ONE CAPSULE BY MOUTH TWICE DAILY BEFORE A MEAL  ? ondansetron (ZOFRAN-ODT) 8 MG disintegrating tablet Take 8 mg by mouth every 8 (eight) hours as needed.  ? risperiDONE (RISPERDAL) 1 MG tablet Take by mouth.  ? traZODone (DESYREL) 50 MG tablet Take 25 mg by mouth at bedtime.  ? VITAMIN D PO Take by mouth daily.  ? [DISCONTINUED] Continuous Blood Gluc Sensor (DEXCOM G6 SENSOR) MISC SMARTSIG:1 Each Topical Every 10 Days  (Patient not taking: Reported on 03/22/2021)  ? [DISCONTINUED] Continuous Blood Gluc Sensor (DEXCOM G6 SENSOR) MISC 4 Pieces by Does not apply route once a week. (Patient not taking: Reported on 03/22/2021)  ? [DISCONTINUED] Continuous Blood Gluc Transmit (DEXCOM G6 TRANSMITTER) MISC  (Patient not taking: Reported on 03/22/2021)  ? [DISCONTINUED] Continuous Blood Gluc Transmit (  DEXCOM G6 TRANSMITTER) MISC 1 Piece by Does not apply route as directed. (Patient not taking: Reported on 03/22/2021)  ? ?Facility-Administered Encounter Medications as of 03/22/2021  ?Medication  ? 0.9 %  sodium chloride infusion  ? 0.9 %  sodium chloride infusion  ? ? ?ALLERGIES: ?Allergies  ?Allergen Reactions  ? Ace Inhibitors Itching  ? Peanut Oil Itching  ? Shellfish Allergy Itching and Rash  ? Statins Itching  ? Adhesive [Tape] Rash  ? Pravastatin Sodium Itching  ? Pedi-Pre Tape Spray [Wound Dressing Adhesive]   ? Shellfish-Derived Products   ? ? ?VACCINATION STATUS: ?Immunization History  ?Administered Date(s) Administered  ? Influenza,inj,Quad PF,6+ Mos 10/22/2015  ? Influenza-Unspecified 10/16/2019  ? Pneumococcal Polysaccharide-23 01/03/2006  ? Td 10/04/2006  ? Tdap 07/10/1996  ? ? ?Diabetes ?She presents for her follow-up diabetic visit. She has type 1 diabetes mellitus. Onset time: She was diagnosed at approximate age of 83 years. Her disease course has been worsening. Pertinent negatives for hypoglycemia include no confusion, nervousness/anxiousness, pallor or seizures. Associated symptoms include fatigue, polydipsia and polyuria. Pertinent negatives for diabetes include no chest pain and no polyphagia. There are no hypoglycemic complications. Symptoms are worsening. There are no diabetic complications. Risk factors for coronary artery disease include diabetes mellitus, dyslipidemia, obesity and sedentary lifestyle. Current diabetic treatment includes insulin injections. Her weight is fluctuating minimally. She is following a  generally unhealthy diet. When asked about meal planning, she reported none. She has not had a previous visit with a dietitian. Her home blood glucose trend is fluctuating minimally. Her breakfast blood glucose ran

## 2021-03-22 NOTE — Patient Instructions (Signed)

## 2021-03-23 ENCOUNTER — Encounter (INDEPENDENT_AMBULATORY_CARE_PROVIDER_SITE_OTHER): Payer: Self-pay | Admitting: Internal Medicine

## 2021-03-23 ENCOUNTER — Ambulatory Visit (INDEPENDENT_AMBULATORY_CARE_PROVIDER_SITE_OTHER): Payer: Medicaid Other | Admitting: Internal Medicine

## 2021-03-23 ENCOUNTER — Other Ambulatory Visit: Payer: Self-pay

## 2021-03-23 VITALS — BP 149/81 | HR 77 | Temp 99.0°F | Ht 61.0 in | Wt 181.9 lb

## 2021-03-23 DIAGNOSIS — K5130 Ulcerative (chronic) rectosigmoiditis without complications: Secondary | ICD-10-CM

## 2021-03-23 DIAGNOSIS — K51311 Ulcerative (chronic) rectosigmoiditis with rectal bleeding: Secondary | ICD-10-CM

## 2021-03-23 DIAGNOSIS — K3184 Gastroparesis: Secondary | ICD-10-CM | POA: Diagnosis not present

## 2021-03-23 DIAGNOSIS — K219 Gastro-esophageal reflux disease without esophagitis: Secondary | ICD-10-CM | POA: Diagnosis not present

## 2021-03-23 MED ORDER — HYDROCORTISONE 100 MG/60ML RE ENEM: 1.0000 | ENEMA | Freq: Every day | RECTAL | 0 refills | Status: DC

## 2021-03-23 MED ORDER — MESALAMINE 1000 MG RE SUPP: 1000.0000 mg | Freq: Every day | RECTAL | 2 refills | Status: DC

## 2021-03-23 NOTE — Progress Notes (Addendum)
Presenting complaint; ? ?Follow-up for ulcerative colitis GERD and gastroparesis. ? ?Database and subjective: ? 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  She was last seen on 09/22/2020. ?She has history of distal ulcerative colitis which was diagnosed in 1997.  Therefore she has had the disease for 25 years.  She also has chronic GERD diabetic gastroparesis and history of iron deficiency anemia which has corrected. ?Last endoscopic evaluation was in December 2020.  EGD revealed fundic type gastric polyps.  Duodenal biopsy was normal.  Colonoscopy revealed mild disease in distal sigmoid colon and moderate disease in rectum.  Topical mesalamine was added to her therapy.  Her last visit I felt she was doing well.   ? ?Patient says she has been using mesalamine suppository every other day.  She feels her disease has flared up.  This started about a month ago.  She notices mucus and blood with the bowel movements.  She also has noted lower abdominal discomfort.  She fever chills or vomiting but has noted some nausea.  No history of recent travel or antibiotic use.  She says she has been under a lot of stress due to family matters. ?Heartburn is well controlled with double dose PPI.  She denies dysphagia.  Her appetite is fair.  She has not lost any weight since her last visit. ? ?Current Medications: ?Outpatient Encounter Medications as of 03/23/2021  ?Medication Sig  ? ACCU-CHEK AVIVA PLUS test strip SMARTSIG:Via Meter 6 Times Daily  ? Accu-Chek Softclix Lancets lancets 4 (four) times daily.  ? acetaminophen (TYLENOL) 500 MG tablet Take 1,000 mg by mouth every 6 (six) hours as needed. For pain  ? acyclovir (ZOVIRAX) 200 MG capsule Take 400 mg by mouth daily. Reported on 07/23/2015  ? albuterol (PROVENTIL HFA;VENTOLIN HFA) 108 (90 Base) MCG/ACT inhaler Inhale 2 puffs into the lungs every 4 (four) hours as needed for wheezing or shortness of breath.  ? atorvastatin (LIPITOR) 20 MG  tablet Take 20 mg by mouth daily.   ? Blood Glucose Monitoring Suppl (ACCU-CHEK GUIDE) w/Device KIT USE TO CHECK SUGAR  ? buPROPion (WELLBUTRIN XL) 150 MG 24 hr tablet Take by mouth daily.  ? Desvenlafaxine Succinate ER 25 MG TB24 Take 25 mg by mouth daily.  ? diclofenac Sodium (VOLTAREN) 1 % GEL Apply 1 application topically 3 (three) times daily.  ? gabapentin (NEURONTIN) 300 MG capsule Take 300-600 mg by mouth See admin instructions. Take 1 capsule (300 mg) by mouth in the morning & take 2 capsules (600 mg) by mouth at night  ? HUMALOG 100 UNIT/ML injection Inject 10-13 Units into the skin 3 (three) times daily with meals. Sliding Scale Insulin  ? LANTUS SOLOSTAR 100 UNIT/ML Solostar Pen Inject 24 Units into the skin at bedtime.  ? loratadine (CLARITIN) 10 MG tablet Take 10 mg by mouth daily.   ? losartan (COZAAR) 50 MG tablet TAKE 1 TABLET BY MOUTH EVERY DAY  ? mesalamine (CANASA) 1000 MG suppository Place 1 suppository (1,000 mg total) rectally at bedtime. Take 3x/week.  ? Mesalamine 800 MG TBEC TAKE TWO (2) TABLETS BY MOUTH IN THE MORNING, AT NOON, AND AT BEDTIME  ? omeprazole (PRILOSEC) 20 MG capsule TAKE ONE CAPSULE BY MOUTH TWICE DAILY BEFORE A MEAL  ? ondansetron (ZOFRAN-ODT) 8 MG disintegrating tablet Take 8 mg by mouth every 8 (eight) hours as needed.  ? traZODone (DESYREL) 50 MG tablet Take 25 mg by mouth at bedtime.  ? ARIPiprazole (ABILIFY) 30  MG tablet Take 30 mg by mouth in the morning. (Patient not taking: Reported on 03/22/2021)  ? Continuous Blood Gluc Receiver (DEXCOM G6 RECEIVER) DEVI 1 Piece by Does not apply route as needed. (Patient not taking: Reported on 03/22/2021)  ? Continuous Blood Gluc Sensor (DEXCOM G6 SENSOR) MISC 4 Pieces by Does not apply route once a week. (Patient not taking: Reported on 03/23/2021)  ? Continuous Blood Gluc Transmit (DEXCOM G6 TRANSMITTER) MISC Change every 90 days (Patient not taking: Reported on 03/23/2021)  ? desvenlafaxine (PRISTIQ) 50 MG 24 hr tablet Take 50 mg  by mouth every morning. (Patient not taking: Reported on 03/23/2021)  ? hydrOXYzine (ATARAX) 10 MG tablet Take 10 mg by mouth at bedtime. (Patient not taking: Reported on 03/23/2021)  ? risperiDONE (RISPERDAL) 1 MG tablet Take by mouth. (Patient not taking: Reported on 03/23/2021)  ? [DISCONTINUED] VITAMIN D PO Take by mouth daily.  ? ?Facility-Administered Encounter Medications as of 03/23/2021  ?Medication  ? 0.9 %  sodium chloride infusion  ? 0.9 %  sodium chloride infusion  ? ? ? ?Objective: ?Blood pressure (!) 149/81, pulse 77, temperature 99 ?F (37.2 ?C), temperature source Oral, height 5' 1"  (1.549 m), weight 181 lb 14.4 oz (82.5 kg), last menstrual period 09/04/2015. ?Patient is alert and in no acute distress. ?Conjunctiva is pink. Sclera is nonicteric ?Oropharyngeal mucosa is normal. ?No neck masses or thyromegaly noted. ?Cardiac exam with regular rhythm normal S1 and S2. No murmur or gallop noted. ?Lungs are clear to auscultation. ?Abdomen is full but soft and nontender with organomegaly or masses. ?She has trace edema around right ankle. ? ?Labs/studies Results: ? ? ?CBC Latest Ref Rng & Units 01/20/2021 06/23/2020 03/11/2020  ?WBC 4.0 - 10.5 K/uL 3.9(L) 4.1 4.8  ?Hemoglobin 12.0 - 15.0 g/dL 12.5 12.7 12.8  ?Hematocrit 36.0 - 46.0 % 40.3 40.4 41.4  ?Platelets 150 - 400 K/uL 165 154 168  ?  ?CMP Latest Ref Rng & Units 01/20/2021 06/23/2020 06/03/2020  ?Glucose 70 - 99 mg/dL 195(H) 258(H) 168(H)  ?BUN 6 - 20 mg/dL 16 10 11   ?Creatinine 0.44 - 1.00 mg/dL 0.83 0.61 0.69  ?Sodium 135 - 145 mmol/L 141 138 145(H)  ?Potassium 3.5 - 5.1 mmol/L 4.0 3.4(L) 4.4  ?Chloride 98 - 111 mmol/L 104 102 106  ?CO2 22 - 32 mmol/L 27 29 27   ?Calcium 8.9 - 10.3 mg/dL 8.9 9.3 9.5  ?Total Protein 6.5 - 8.1 g/dL 6.7 6.8 6.7  ?Total Bilirubin 0.3 - 1.2 mg/dL 0.5 0.5 0.6  ?Alkaline Phos 38 - 126 U/L 104 97 126(H)  ?AST 15 - 41 U/L 20 24 17   ?ALT 0 - 44 U/L 17 24 15   ?  ?Hepatic Function Latest Ref Rng & Units 01/20/2021 06/23/2020 06/03/2020  ?Total  Protein 6.5 - 8.1 g/dL 6.7 6.8 6.7  ?Albumin 3.5 - 5.0 g/dL 3.8 4.1 4.5  ?AST 15 - 41 U/L 20 24 17   ?ALT 0 - 44 U/L 17 24 15   ?Alk Phosphatase 38 - 126 U/L 104 97 126(H)  ?Total Bilirubin 0.3 - 1.2 mg/dL 0.5 0.5 0.6  ?  ?Lab Results  ?Component Value Date  ? CRP 1.4 12/02/2019  ?  ?Above lab data reviewed. ? ?Assessment: ? ?#1.  History of distal ulcerative colitis.  Colonoscopy in December 2020 revealed disease limited to the rectum.  Her symptoms are suggestive of relapse of her disease but it must be limited to the rectum as she is passing formed stool.  Her symptoms  appear to be mild and therefore no indication for moving to [facility]. ?Short course of prednisone would be reasonable but she is diabetic and therefore will hold off for now.  We will treat her with topical steroids and increase mesalamine suppositories to daily. ? ?#2.  GERD/gastroparesis.  Patient appears to be doing fairly well with dietary measures and double dose PPI. ? ?Plan: ? ?Fecal calprotectin. ?Increase mesalamine suppository to 1 daily per rectum at bedtime. ?Cort enemas 100 mg per rectum daily at bedtime for 2 weeks. ?Hydrocortisone enemas should be started after stool specimen provided to the lab. ?Office visit in 2 months. ? ? ? ? ? ?

## 2021-03-23 NOTE — Patient Instructions (Signed)
Begin hydrocortisone enemas after stool sample provided to the lab. ?Use mesalamine suppository per rectum every night. ?

## 2021-03-25 ENCOUNTER — Other Ambulatory Visit: Payer: Self-pay | Admitting: "Endocrinology

## 2021-03-27 ENCOUNTER — Encounter (INDEPENDENT_AMBULATORY_CARE_PROVIDER_SITE_OTHER): Payer: Self-pay | Admitting: Internal Medicine

## 2021-04-10 LAB — CALPROTECTIN, FECAL: Calprotectin, Fecal: 152 ug/g — ABNORMAL HIGH (ref 0–120)

## 2021-04-27 ENCOUNTER — Other Ambulatory Visit (INDEPENDENT_AMBULATORY_CARE_PROVIDER_SITE_OTHER): Payer: Self-pay | Admitting: Internal Medicine

## 2021-05-31 ENCOUNTER — Other Ambulatory Visit: Payer: Self-pay | Admitting: "Endocrinology

## 2021-06-08 ENCOUNTER — Telehealth: Payer: Self-pay | Admitting: "Endocrinology

## 2021-06-08 NOTE — Telephone Encounter (Signed)
Pt has an appt 6/20 and wanted to come in sooner to have paper work filled out, we do not have anything sooner. Patient is going to drop of paper work to see if the provider will fill out.

## 2021-06-09 ENCOUNTER — Ambulatory Visit: Payer: Medicaid Other | Admitting: "Endocrinology

## 2021-06-10 NOTE — Telephone Encounter (Signed)
Paperwork placed in Brittney [name]'s box to be filled out. Patient would like a call when ready for p/u

## 2021-06-15 ENCOUNTER — Ambulatory Visit (INDEPENDENT_AMBULATORY_CARE_PROVIDER_SITE_OTHER): Payer: Medicaid Other | Admitting: Internal Medicine

## 2021-06-15 ENCOUNTER — Encounter (INDEPENDENT_AMBULATORY_CARE_PROVIDER_SITE_OTHER): Payer: Self-pay | Admitting: Internal Medicine

## 2021-06-15 VITALS — BP 138/85 | HR 70 | Temp 98.3°F | Ht 61.0 in | Wt 185.7 lb

## 2021-06-15 DIAGNOSIS — K219 Gastro-esophageal reflux disease without esophagitis: Secondary | ICD-10-CM | POA: Diagnosis not present

## 2021-06-15 DIAGNOSIS — K5130 Ulcerative (chronic) rectosigmoiditis without complications: Secondary | ICD-10-CM | POA: Diagnosis not present

## 2021-06-15 NOTE — Patient Instructions (Signed)
Please call office if you have diarrhea or rectal bleeding becomes more frequent or volume increases.

## 2021-06-15 NOTE — Progress Notes (Signed)
Presenting complaint;  Follow-up for ulcerative colitis GERD and gastroparesis.  Database and subjective:  Patient is 51 year old African-American Brittney [name] who is here for scheduled visit.  She was last seen on 03/23/2021. She has 26-year history of distal ulcerative colitis.  She also has chronic GERD diabetic gastroparesis and history of iron deficiency anemia which is corrected with p.o. iron. Colonoscopy in December 2020 revealed mild focal disease in distal sigmoid colon and moderate disease in rectum.  Topical mesalamine was added to her therapy.  On her last visit she was felt to have mild relapse of her disease.  I felt her symptoms are most likely due to rectal disease.  She was treated with hydrocortisone enemas daily for 2 weeks.  She had fecal calprotectin towards the end of topical therapy and was mildly elevated at 152.  Patient states she is feeling better.  She has 1-2 formed stools daily.  She may notice small amount of blood on the tissue with a bowel movement couple of times a month but not every week.  She denies abdominal pain.  Her appetite is fair.  However she is not losing any weight.  She has gained 4 pounds since her last visit. Heartburn is well controlled with PPI.  She denies early satiety epigastric pain or vomiting.  She does not eat fatty foods and red meat.  She has occasional postprandial regurgitation. She says her diabetic control has been fair.  Last hemoglobin A1c few weeks ago was 8.1.  Current Medications: Outpatient Encounter Medications as of 06/15/2021  Medication Sig   ACCU-CHEK AVIVA PLUS test strip SMARTSIG:Via Meter 6 Times Daily   Accu-Chek Softclix Lancets lancets 4 (four) times daily.   acetaminophen (TYLENOL) 500 MG tablet Take 1,000 mg by mouth every 6 (six) hours as needed. For pain   acyclovir (ZOVIRAX) 200 MG capsule Take 400 mg by mouth daily. Reported on 07/23/2015   albuterol (PROVENTIL HFA;VENTOLIN HFA) 108 (90 Base) MCG/ACT inhaler Inhale  2 puffs into the lungs every 4 (four) hours as needed for wheezing or shortness of breath.   atorvastatin (LIPITOR) 20 MG tablet Take 20 mg by mouth daily.    Blood Glucose Monitoring Suppl (ACCU-CHEK GUIDE) w/Device KIT USE TO CHECK SUGAR   Continuous Blood Gluc Receiver (DEXCOM G6 RECEIVER) DEVI 1 Piece by Does not apply route as needed.   Continuous Blood Gluc Sensor (DEXCOM G6 SENSOR) MISC 4 Pieces by Does not apply route once a week.   Continuous Blood Gluc Transmit (DEXCOM G6 TRANSMITTER) MISC Change every 90 days   diclofenac Sodium (VOLTAREN) 1 % GEL Apply 1 application topically 3 (three) times daily.   gabapentin (NEURONTIN) 300 MG capsule Take 300-600 mg by mouth See admin instructions. Take 1 capsule (300 mg) by mouth in the morning & take 2 capsules (600 mg) by mouth at night   HUMALOG 100 UNIT/ML injection Inject 10-13 Units into the skin 3 (three) times daily with meals. Sliding Scale Insulin   hydrocortisone (CORTENEMA) 100 MG/60ML enema Place 1 enema (100 mg total) rectally at bedtime.   LANTUS SOLOSTAR 100 UNIT/ML Solostar Pen Inject 24 Units into the skin at bedtime.   loratadine (CLARITIN) 10 MG tablet Take 10 mg by mouth daily.    losartan (COZAAR) 50 MG tablet TAKE 1 TABLET BY MOUTH EVERY DAY   mesalamine (CANASA) 1000 MG suppository Place 1 suppository (1,000 mg total) rectally at bedtime. Take 3x/week.   Mesalamine 800 MG TBEC TAKE TWO (2) TABLETS BY MOUTH IN THE  MORNING, AT NOON, AND AT BEDTIME   omeprazole (PRILOSEC) 20 MG capsule TAKE ONE CAPSULE BY MOUTH TWICE DAILY BEFORE A MEAL   ondansetron (ZOFRAN-ODT) 8 MG disintegrating tablet Take 8 mg by mouth every 8 (eight) hours as needed.   [DISCONTINUED] buPROPion (WELLBUTRIN XL) 150 MG 24 hr tablet Take by mouth daily. (Patient not taking: Reported on 06/15/2021)   [DISCONTINUED] Desvenlafaxine Succinate ER 25 MG TB24 Take 25 mg by mouth daily.   [DISCONTINUED] traZODone (DESYREL) 50 MG tablet Take 25 mg by mouth at  bedtime.   Facility-Administered Encounter Medications as of 06/15/2021  Medication   0.9 %  sodium chloride infusion   0.9 %  sodium chloride infusion     Objective: Blood pressure 138/85, pulse 70, temperature 98.3 F (36.8 C), temperature source Oral, height _0  (1.549 m), weight 185 lb 11.2 oz (Brittney.2 kg), last menstrual period 09/04/2015. Patient is alert and in no acute distress. Conjunctiva is pink. Sclera is nonicteric Oropharyngeal mucosa is normal. No neck masses or thyromegaly noted. Cardiac exam with regular rhythm normal S1 and S2. No murmur or gallop noted. Lungs are clear to auscultation. Abdomen is full but soft and nontender with organomegaly or masses. No LE edema or clubbing noted.  Labs/studies Results:      Latest Ref Rng & Units 01/20/2021   10:53 AM 06/23/2020   11:58 AM 03/11/2020   12:59 PM  CBC  WBC 4.0 - 10.5 K/uL 3.9  4.1  4.8   Hemoglobin 12.0 - 15.0 g/dL 12.5  12.7  12.8   Hematocrit 36.0 - 46.0 % 40.3  40.4  41.4   Platelets 150 - 400 K/uL 165  154  168        Latest Ref Rng & Units 01/20/2021   10:53 AM 06/23/2020   11:58 AM 06/03/2020   10:06 AM  CMP  Glucose 70 - 99 mg/dL 195  258  168   BUN 6 - 20 mg/dL _1 Creatinine 0.44 - 1.00 mg/dL 0.83  0.61  0.69   Sodium 135 - 145 mmol/L 141  138  145   Potassium 3.5 - 5.1 mmol/L 4.0  3.4  4.4   Chloride 98 - 111 mmol/L 104  102  106   CO2 22 - 32 mmol/L _2 Calcium 8.9 - 10.3 mg/dL 8.9  9.3  9.5   Total Protein 6.5 - 8.1 g/dL 6.7  6.8  6.7   Total Bilirubin 0.3 - 1.2 mg/dL 0.5  0.5  0.6   Alkaline Phos 38 - 126 U/L 104  97  126   AST 15 - 41 U/L _3 ALT 0 - 44 U/L _4 Latest Ref Rng & Units 01/20/2021   10:53 AM 06/23/2020   11:58 AM 06/03/2020   10:06 AM  Hepatic Function  Total Protein 6.5 - 8.1 g/dL 6.7  6.8  6.7   Albumin 3.5 - 5.0 g/dL 3.8  4.1  4.5   AST 15 - 41 U/L _5 ALT 0 - 44 U/L _6 Alk Phosphatase 38 - 126 U/L 104  97   126   Total Bilirubin 0.3 - 1.2 mg/dL 0.5  0.5  0.6      Assessment:  #1.  Distal ulcerative colitis.  Disease duration 26 years.  Last colonoscopy was 2-1/2 years ago revealing mild disease in sigmoid colon and rectum.  She remains on fairly high-dose oral mesalamine along with mesalamine suppository 3 times a week.  Intermittent hematochezia most likely secondary to hemorrhoids.  Hemoglobin back in January was normal. We will continue to monitor.  If she has symptoms suggest relapse would consider restaging her disease activity before other therapy is considered.  #2.  GERD/gastroparesis.  She is doing well with dietary measures and double dose PPI.  No indication for promotility agents.  #3.  History of iron deficiency anemia most likely due to impaired absorption secondary to chronic PPI therapy.  Her H&H has been normal.   Plan:  Continue mesalamine at a dose of 1600 mg by mouth 3 times a day. Continue omeprazole at 20 mg p.o. twice daily. Patient will call if she develops diarrhea or rectal bleeding is more than just blood on the tissue. Office visit in 6 months.     I did, her call everybody with the report so over the weekend it was

## 2021-06-18 ENCOUNTER — Other Ambulatory Visit (INDEPENDENT_AMBULATORY_CARE_PROVIDER_SITE_OTHER): Payer: Self-pay | Admitting: Internal Medicine

## 2021-06-18 DIAGNOSIS — K5130 Ulcerative (chronic) rectosigmoiditis without complications: Secondary | ICD-10-CM

## 2021-06-18 DIAGNOSIS — K5120 Ulcerative (chronic) proctitis without complications: Secondary | ICD-10-CM

## 2021-06-18 DIAGNOSIS — K51919 Ulcerative colitis, unspecified with unspecified complications: Secondary | ICD-10-CM

## 2021-06-18 DIAGNOSIS — K5190 Ulcerative colitis, unspecified, without complications: Secondary | ICD-10-CM

## 2021-06-22 ENCOUNTER — Encounter: Payer: Self-pay | Admitting: "Endocrinology

## 2021-06-22 ENCOUNTER — Ambulatory Visit: Payer: Medicaid Other | Admitting: "Endocrinology

## 2021-06-22 VITALS — BP 122/88 | HR 64 | Ht 61.0 in | Wt 186.0 lb

## 2021-06-22 DIAGNOSIS — E782 Mixed hyperlipidemia: Secondary | ICD-10-CM

## 2021-06-22 DIAGNOSIS — E559 Vitamin D deficiency, unspecified: Secondary | ICD-10-CM

## 2021-06-22 DIAGNOSIS — Z6835 Body mass index (BMI) 35.0-35.9, adult: Secondary | ICD-10-CM

## 2021-06-22 DIAGNOSIS — E1065 Type 1 diabetes mellitus with hyperglycemia: Secondary | ICD-10-CM | POA: Diagnosis not present

## 2021-06-22 LAB — POCT GLYCOSYLATED HEMOGLOBIN (HGB A1C): HbA1c, POC (controlled diabetic range): 8.5 % — AB (ref 0.0–7.0)
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
## 2021-06-22 NOTE — Patient Instructions (Signed)

## 2021-06-30 ENCOUNTER — Other Ambulatory Visit: Payer: Self-pay | Admitting: "Endocrinology

## 2021-07-07 ENCOUNTER — Inpatient Hospital Stay (HOSPITAL_COMMUNITY): Payer: Medicaid Other | Attending: Hematology

## 2021-07-07 DIAGNOSIS — D500 Iron deficiency anemia secondary to blood loss (chronic): Secondary | ICD-10-CM | POA: Diagnosis not present

## 2021-07-07 DIAGNOSIS — Z803 Family history of malignant neoplasm of breast: Secondary | ICD-10-CM | POA: Diagnosis not present

## 2021-07-07 DIAGNOSIS — E109 Type 1 diabetes mellitus without complications: Secondary | ICD-10-CM | POA: Insufficient documentation

## 2021-07-07 DIAGNOSIS — Z807 Family history of other malignant neoplasms of lymphoid, hematopoietic and related tissues: Secondary | ICD-10-CM | POA: Diagnosis not present

## 2021-07-07 DIAGNOSIS — Z853 Personal history of malignant neoplasm of breast: Secondary | ICD-10-CM | POA: Diagnosis not present

## 2021-07-07 DIAGNOSIS — F32A Depression, unspecified: Secondary | ICD-10-CM | POA: Insufficient documentation

## 2021-07-07 DIAGNOSIS — E538 Deficiency of other specified B group vitamins: Secondary | ICD-10-CM | POA: Insufficient documentation

## 2021-07-07 DIAGNOSIS — Z800 Family history of malignant neoplasm of digestive organs: Secondary | ICD-10-CM | POA: Insufficient documentation

## 2021-07-07 DIAGNOSIS — I890 Lymphedema, not elsewhere classified: Secondary | ICD-10-CM | POA: Diagnosis not present

## 2021-07-07 DIAGNOSIS — K5190 Ulcerative colitis, unspecified, without complications: Secondary | ICD-10-CM | POA: Diagnosis not present

## 2021-07-07 DIAGNOSIS — C50411 Malignant neoplasm of upper-outer quadrant of right female breast: Secondary | ICD-10-CM

## 2021-07-07 LAB — COMPREHENSIVE METABOLIC PANEL
ALT: 17 U/L (ref 0–44)
AST: 22 U/L (ref 15–41)
Albumin: 3.6 g/dL (ref 3.5–5.0)
Alkaline Phosphatase: 96 U/L (ref 38–126)
Anion gap: 8 (ref 5–15)
BUN: 11 mg/dL (ref 6–20)
CO2: 28 mmol/L (ref 22–32)
Calcium: 8.9 mg/dL (ref 8.9–10.3)
Chloride: 103 mmol/L (ref 98–111)
Creatinine, Ser: 0.64 mg/dL (ref 0.44–1.00)
GFR, Estimated: >60 mL/min (ref >60)
Glucose, Bld: 339 mg/dL — ABNORMAL HIGH (ref 70–99)
Potassium: 4.2 mmol/L (ref 3.5–5.1)
Sodium: 139 mmol/L (ref 135–145)
Total Bilirubin: 0.6 mg/dL (ref 0.3–1.2)
Total Protein: 6.6 g/dL (ref 6.5–8.1)

## 2021-07-07 LAB — IRON AND TIBC
Iron: 89 ug/dL (ref 28–170)
Saturation Ratios: 26 % (ref 10.4–31.8)
TIBC: 348 ug/dL (ref 250–450)
UIBC: 259 ug/dL

## 2021-07-07 LAB — VITAMIN B12: Vitamin B-12: 832 pg/mL (ref 180–914)

## 2021-07-07 LAB — FERRITIN: Ferritin: 160 ng/mL (ref 11–307)

## 2021-07-08 ENCOUNTER — Other Ambulatory Visit (HOSPITAL_COMMUNITY)
Admission: RE | Admit: 2021-07-08 | Discharge: 2021-07-08 | Disposition: A | Discharge status: Home / Self Care | Payer: Medicaid Other | Source: Ambulatory Visit | Attending: Hematology | Admitting: Hematology

## 2021-07-08 ENCOUNTER — Other Ambulatory Visit (HOSPITAL_COMMUNITY): Admission: RE | Admit: 2021-07-08 | Payer: Medicaid Other | Source: Ambulatory Visit | Admitting: Hematology

## 2021-07-08 DIAGNOSIS — D701 Agranulocytosis secondary to cancer chemotherapy: Secondary | ICD-10-CM | POA: Insufficient documentation

## 2021-07-08 LAB — CBC WITH DIFFERENTIAL/PLATELET
Abs Immature Granulocytes: 0.01 10*3/uL (ref 0.00–0.07)
Basophils Absolute: 0 10*3/uL (ref 0.0–0.1)
Basophils Relative: 0 %
Eosinophils Absolute: 0 10*3/uL (ref 0.0–0.5)
Eosinophils Relative: 0 %
HCT: 39.4 % (ref 36.0–46.0)
Hemoglobin: 12.4 g/dL (ref 12.0–15.0)
Immature Granulocytes: 0 %
Lymphocytes Relative: 42 %
Lymphs Abs: 2.1 10*3/uL (ref 0.7–4.0)
MCH: 27.3 pg (ref 26.0–34.0)
MCHC: 31.5 g/dL (ref 30.0–36.0)
MCV: 86.8 fL (ref 80.0–100.0)
Monocytes Absolute: 0.4 10*3/uL (ref 0.1–1.0)
Monocytes Relative: 8 %
Neutro Abs: 2.5 10*3/uL (ref 1.7–7.7)
Neutrophils Relative %: 50 %
Platelets: 189 10*3/uL (ref 150–400)
RBC: 4.54 MIL/uL (ref 3.87–5.11)
RDW: 14.3 % (ref 11.5–15.5)
WBC: 5 10*3/uL (ref 4.0–10.5)
nRBC: 0 % (ref 0.0–0.2)

## 2021-07-09 ENCOUNTER — Other Ambulatory Visit: Payer: Self-pay | Admitting: "Endocrinology

## 2021-07-09 LAB — METHYLMALONIC ACID, SERUM: Methylmalonic Acid, Quantitative: 142 nmol/L (ref 0–378)

## 2021-07-12 ENCOUNTER — Ambulatory Visit (HOSPITAL_COMMUNITY): Payer: Medicaid Other

## 2021-07-14 ENCOUNTER — Ambulatory Visit (HOSPITAL_COMMUNITY)
Admission: RE | Admit: 2021-07-14 | Discharge: 2021-07-14 | Disposition: A | Discharge status: Home / Self Care | Payer: Medicaid Other | Source: Ambulatory Visit | Attending: Physician Assistant | Admitting: Physician Assistant

## 2021-07-14 ENCOUNTER — Ambulatory Visit (HOSPITAL_COMMUNITY): Payer: Medicaid Other | Admitting: Physician Assistant

## 2021-07-14 DIAGNOSIS — Z1231 Encounter for screening mammogram for malignant neoplasm of breast: Secondary | ICD-10-CM | POA: Insufficient documentation

## 2021-07-14 DIAGNOSIS — Z853 Personal history of malignant neoplasm of breast: Secondary | ICD-10-CM | POA: Diagnosis not present

## 2021-07-14 DIAGNOSIS — C50411 Malignant neoplasm of upper-outer quadrant of right female breast: Secondary | ICD-10-CM

## 2021-07-17 NOTE — Progress Notes (Deleted)
ARRIVED LATE - WILL RESCHEDULE

## 2021-07-19 ENCOUNTER — Inpatient Hospital Stay (HOSPITAL_COMMUNITY): Payer: Medicaid Other | Admitting: Physician Assistant

## 2021-07-21 NOTE — Progress Notes (Signed)
Brittney [name], [place] [id number]   CLINIC:  Medical Oncology/Hematology  PCP:  [name], MD [address] 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 breast cancer + iron deficiency anemia  PRIOR THERAPY: AC x4, weekly Taxol x2 cycles followed by Taxotere x3 cycles   CURRENT THERAPY: Surveillance and intermittent iron infusions   BRIEF ONCOLOGIC HISTORY:  Oncology History  Malignant neoplasm of upper-outer quadrant of right female breast ([place])  04/29/2015 Initial Biopsy   upper outer quadrant mass R breast 2.9 cm by ultrasound, high grade invasive ductal carcinoma ER< 1%, PR 1%, HER -2 not amplified, Ki-67 66%   05/13/2015 Cancer Staging   pT1cN0   05/13/2015 Surgery   lumpectomy and sentinel node biopsies X 3, 2 cm tumor, 3 negative nodes    05/21/2015 Imaging   CT [facility] with postsurgical changes related to R breast lympectomy and R axillary LN dissection, no findings suspicious for metastatic disease   06/12/2015 Procedure   Port placement by Dr. Anthony Sar   06/17/2015 Imaging   MUGA- Normal left ventricular ejection fraction equal to 62.9%.   06/25/2015 - 08/13/2015 Chemotherapy   AC x 4   08/27/2015 - 09/03/2015 Chemotherapy   Weekly Taxol x 2 cycles    09/11/2015 - 09/15/2015 Hospital Admission   Neutropenic fever, ARF secondary to antibiotics   10/01/2015 - 11/12/2015 Chemotherapy   Taxotere 75 mg/m2 Q3 weeks x 3 cycles     10/01/2015 Treatment Plan Change   Taxol changed to Taxotere given hospitalization.     - 01/14/2016 Radiation Therapy   Radiation in [facility]   05/11/2016 Mammogram   IMPRESSION: No evidence of malignancy within either breast. Expected postsurgical changes within the right breast.      CANCER STAGING: Cancer Staging  Malignant neoplasm of upper-outer quadrant of right female breast [facility]) Staging form: Breast, AJCC 7th  Edition - Clinical stage from 05/13/2015: Stage IA (T1c, N0, M0) - Signed by Baird Cancer, PA-C on 06/25/2015   INTERVAL HISTORY:  [name], a 51 y.o. female, returns for routine follow-up of her history of right-sided breast cancer and iron deficiency anemia. Brittney [name] was last seen on 01/28/2021 by Tarri Abernethy PA-C.  At today's visit, she reports feeling well, although she is struggling with increased depression.  She denies any thoughts of self-harm or suicidal ideation/intent, and reports that she is seeing a therapist.  Otherwise, she denies any recent hospitalizations, surgeries, or changes in her baseline health status.  Most recent mammogram on 07/14/2021 showed dense breast tissue but no evidence of malignancy (BI-RADS Category 1, negative).  She continues to have occasional right breast pain and intermittent lymphedema of her right arm, and continues to use her lymphedema sleeve and pump at home.     She denies any symptoms of recurrence such as new lumps, bone pain, chest pain, dyspnea, or abdominal pain.  She has no seizures focal neurologic deficits.  No B symptoms such as fever, chills, night sweats, unintentional weight loss.  She reports occasional headaches, but nothing outside of her "normal" headache pattern.  She has occasional hot flashes.  Regarding her iron deficiency anemia, she reports that her energy level is at baseline without any significant fatigue. She denies any signs of blood loss such as hematemesis, hematochezia, melena, or epistaxis.  She denies any recent rectal bleeding from her ulcerative  colitis.  She denies any pica, restless legs, or headaches.  She reports 80% energy and 100% appetite.  She is maintaining stable weight at this time.    REVIEW OF SYSTEMS:  Review of Systems  Constitutional:  Negative for appetite change, chills, diaphoresis, fatigue, fever and unexpected weight change.  HENT:   Negative for lump/mass and  nosebleeds.   Eyes:  Negative for eye problems.  Respiratory:  Negative for cough, hemoptysis and shortness of breath.   Cardiovascular:  Negative for chest pain, leg swelling and palpitations.  Gastrointestinal:  Negative for abdominal pain, blood in stool, constipation, diarrhea, nausea and vomiting.  Genitourinary:  Negative for hematuria.   Musculoskeletal:  Positive for arthralgias (foot pain secondary to hairline fracture).  Skin: Negative.   Neurological:  Negative for dizziness, headaches and light-headedness.  Hematological:  Does not bruise/bleed easily.    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) [facility])    Past Surgical History:  Procedure Laterality Date   BIOPSY  12/05/2018   Procedure: BIOPSY;  Surgeon: Rogene Houston, MD;  [facility];  Service: Endoscopy;;  duodenum gastric colon   BREAST LUMPECTOMY Right 2017   CESAREAN SECTION     COLONOSCOPY N/A 10/09/2013   Procedure: COLONOSCOPY;  Surgeon: Rogene Houston, MD;  Location: [facility];  Service: Endoscopy;  Laterality: N/A;  100   COLONOSCOPY N/A 12/05/2018   Procedure: COLONOSCOPY;  Surgeon: Rogene Houston, MD;  [facility]: [facility];  Service: Endoscopy;  Laterality: N/A;   DILATION AND CURETTAGE OF UTERUS     x 2 for miscarriages   ESOPHAGOGASTRODUODENOSCOPY     ESOPHAGOGASTRODUODENOSCOPY N/A 12/05/2018   Procedure: ESOPHAGOGASTRODUODENOSCOPY (EGD);  Surgeon: Rogene Houston, MD;  [facility]:  [facility];  Service: Endoscopy;  Laterality: N/A;  200pm   FLEXIBLE SIGMOIDOSCOPY     FLEXIBLE SIGMOIDOSCOPY  07/27/2011   Procedure: FLEXIBLE SIGMOIDOSCOPY;  Surgeon: Rogene Houston, MD;  [facility]: [facility];  Service: Endoscopy;  Laterality: N/A;  100    SOCIAL HISTORY:  Social History   Socioeconomic History   Marital status: Single    Spouse name: Not on file   Number of children: Not on file   Years of education: Not on file   Highest education level: Not on file  Occupational History   Not on file  Tobacco Use   Smoking status: Never    Passive exposure: Current   Smokeless tobacco: Never  Vaping Use   Vaping Use: Never used  Substance and Sexual Activity   Alcohol use: No    Alcohol/week: 0.0 standard drinks of alcohol   Drug use: No   Sexual activity: Not on file  Other Topics Concern   Not on file  Social History Narrative   Not on file   Social Determinants of Health   Financial Resource Strain: Low Risk  (12/12/2019)   Overall Financial Resource Strain (CARDIA)  Difficulty of Paying Living Expenses: Not hard at all  Food Insecurity: No Food Insecurity (12/12/2019)   Hunger Vital Sign    Worried About Running Out of Food in the Last Year: Never true    Ran Out of Food in the Last Year: Never true  Transportation Needs: No Transportation Needs (12/12/2019)   PRAPARE - Hydrologist (Medical): No    Lack of Transportation (Non-Medical): No  Physical Activity: Inactive (12/12/2019)   Exercise Vital Sign    Days of Exercise per Week: 0 days    Minutes of Exercise per Session: 0 min  Stress: No Stress Concern Present (12/12/2019)   [place]    Feeling of Stress : Not at all  Social Connections: Moderately Integrated (12/12/2019)   Social Connection and Isolation Panel [NHANES]    Frequency of Communication with Friends and Family: More than three times a week     Frequency of Social Gatherings with Friends and Family: Three times a week    Attends Religious Services: 1 to 4 times per year    Active Member of Clubs or Organizations: No    Attends Archivist Meetings: 1 to 4 times per year    Marital Status: Never married  Intimate Partner Violence: Not At Risk (12/12/2019)   Humiliation, Afraid, Rape, and Kick questionnaire    Fear of Current or Ex-Partner: No    Emotionally Abused: No    Physically Abused: No    Sexually Abused: No    FAMILY HISTORY:  Family History  Problem Relation Age of Onset   Non-Hodgkin's lymphoma Mother    Hypertension Mother    Diabetes Mellitus II Mother    Hypertension Father    Diabetes Mellitus II Father    Breast cancer Cousin    Breast cancer Maternal Aunt    Colon cancer Maternal Uncle     CURRENT MEDICATIONS:  Current Outpatient Medications  Medication Sig Dispense Refill   ACCU-CHEK AVIVA PLUS test strip SMARTSIG:Via Meter 6 Times Daily     Accu-Chek Softclix Lancets lancets 4 (four) times daily.     acetaminophen (TYLENOL) 500 MG tablet Take 1,000 mg by mouth every 6 (six) hours as needed. For pain     acyclovir (ZOVIRAX) 200 MG capsule Take 400 mg by mouth daily. Reported on 07/23/2015     albuterol (PROVENTIL HFA;VENTOLIN HFA) 108 (90 Base) MCG/ACT inhaler Inhale 2 puffs into the lungs every 4 (four) hours as needed for wheezing or shortness of breath.     atorvastatin (LIPITOR) 20 MG tablet Take 20 mg by mouth daily.      Blood Glucose Monitoring Suppl (ACCU-CHEK GUIDE) w/Device KIT USE TO CHECK SUGAR     Continuous Blood Gluc Receiver (DEXCOM G6 RECEIVER) DEVI 1 Piece by Does not apply route as needed. 1 each 0   Continuous Blood Gluc Sensor (DEXCOM G6 SENSOR) MISC USE AS DIRECTED, CHANGE SENSOR EVERY 10 DAYS 3 each 2   Continuous Blood Gluc Transmit (DEXCOM G6 TRANSMITTER) MISC USE AS DIRECTED, CHANGE EVERY 90 DAYS 1 each 1   diclofenac Sodium (VOLTAREN) 1 % GEL Apply 1 application  topically 3 (three) times daily.     gabapentin (NEURONTIN) 300 MG capsule Take 300-600 mg by mouth See admin instructions. Take 1 capsule (300 mg) by mouth in the morning & take 2 capsules (600 mg) by mouth at night  6   HUMALOG 100 UNIT/ML injection Inject  12-15 Units into the skin 3 (three) times daily with meals. Sliding Scale Insulin  6   hydrocortisone (CORTENEMA) 100 MG/60ML enema Place 1 enema (100 mg total) rectally at bedtime. 60 mL 0   LANTUS SOLOSTAR 100 UNIT/ML Solostar Pen Inject 24 Units into the skin at bedtime.  6   loratadine (CLARITIN) 10 MG tablet Take 10 mg by mouth daily.   6   losartan (COZAAR) 50 MG tablet TAKE 1 TABLET BY MOUTH EVERY DAY 90 tablet 0   mesalamine (CANASA) 1000 MG suppository Place 1 suppository (1,000 mg total) rectally at bedtime. Take 3x/week. 30 suppository 2   Mesalamine 800 MG TBEC TAKE TWO (2) TABLETS BY MOUTH IN THE MORNING, AT NOON, AND AT BEDTIME 180 tablet 5   omeprazole (PRILOSEC) 20 MG capsule TAKE ONE CAPSULE BY MOUTH TWICE DAILY BEFORE A MEAL 180 capsule 3   ondansetron (ZOFRAN-ODT) 8 MG disintegrating tablet Take 8 mg by mouth every 8 (eight) hours as needed.     No current facility-administered medications for this visit.   Facility-Administered Medications Ordered in Other Visits  Medication Dose Route Frequency Provider Last Rate Last Admin   0.9 %  sodium chloride infusion   Intravenous Continuous Kefalas, Manon Hilding, PA-C       0.9 %  sodium chloride infusion   Intravenous Continuous Higgs, Vetta, MD   Stopped at 05/26/17 1510    ALLERGIES:  Allergies  Allergen Reactions   Ace Inhibitors Itching   Peanut Oil Itching   Shellfish Allergy Itching and Rash   Statins Itching   Adhesive [Tape] Rash   Pravastatin Sodium Itching   Pedi-Pre Tape Spray [Wound Dressing Adhesive]    Shellfish-Derived Products     PHYSICAL EXAM:  Performance status (ECOG): 0 - Asymptomatic  There were no vitals filed for this visit. Wt Readings from  Last 3 Encounters:  06/22/21 186 lb (84.4 kg)  06/15/21 185 lb 11.2 oz (84.2 kg)  03/23/21 181 lb 14.4 oz (82.5 kg)   Physical Exam Constitutional:      Appearance: Normal appearance. She is obese.  HENT:     Head: Normocephalic and atraumatic.     Mouth/Throat:     Mouth: Mucous membranes are moist.  Eyes:     Extraocular Movements: Extraocular movements intact.     Pupils: Pupils are equal, round, and reactive to light.  Neck:     Thyroid: Thyromegaly (questionable thyromegaly versus increased fatty tissue) present.  Cardiovascular:     Rate and Rhythm: Normal rate and regular rhythm.     Pulses: Normal pulses.     Heart sounds: Normal heart sounds.  Pulmonary:     Effort: Pulmonary effort is normal.     Breath sounds: Normal breath sounds.  Chest:       Comments: Scar tissue palpated beneath lumpectomy scar and lymph node biopsy scar.  Right sided breast tissue is somewhat thicker compared to left side.  Otherwise, no discrete nodules or masses palpated on exam. Abdominal:     General: Bowel sounds are normal.     Palpations: Abdomen is soft.     Tenderness: There is no abdominal tenderness.  Musculoskeletal:        General: No swelling.     Right lower leg: No edema.     Left lower leg: No edema.  Lymphadenopathy:     Cervical: No cervical adenopathy.     Upper Body:     Right upper body: No supraclavicular, axillary or pectoral  adenopathy.     Left upper body: No supraclavicular, axillary or pectoral adenopathy.  Skin:    General: Skin is warm and dry.  Neurological:     General: No focal deficit present.     Mental Status: She is alert and oriented to person, place, and time.  Psychiatric:        Mood and Affect: Mood normal.        Behavior: Behavior normal.      LABORATORY DATA:  I have reviewed the labs as listed.     Latest Ref Rng & Units 07/08/2021    1:41 PM 01/20/2021   10:53 AM 06/23/2020   11:58 AM  CBC  WBC 4.0 - 10.5 K/uL 5.0  3.9  4.1    Hemoglobin 12.0 - 15.0 g/dL 12.4  12.5  12.7   Hematocrit 36.0 - 46.0 % 39.4  40.3  40.4   Platelets 150 - 400 K/uL 189  165  154       Latest Ref Rng & Units 07/07/2021    9:47 AM 01/20/2021   10:53 AM 06/23/2020   11:58 AM  CMP  Glucose 70 - 99 mg/dL 339  195  258   BUN 6 - 20 mg/dL _0 Creatinine 0.44 - 1.00 mg/dL 0.64  0.83  0.61   Sodium 135 - 145 mmol/L 139  141  138   Potassium 3.5 - 5.1 mmol/L 4.2  4.0  3.4   Chloride 98 - 111 mmol/L 103  104  102   CO2 22 - 32 mmol/L _1 Calcium 8.9 - 10.3 mg/dL 8.9  8.9  9.3   Total Protein 6.5 - 8.1 g/dL 6.6  6.7  6.8   Total Bilirubin 0.3 - 1.2 mg/dL 0.6  0.5  0.5   Alkaline Phos 38 - 126 U/L 96  104  97   AST 15 - 41 U/L _2 ALT 0 - 44 U/L _3 DIAGNOSTIC IMAGING:  I have independently reviewed the scans and discussed with the patient. MM 3D SCREEN BREAST BILATERAL  Result Date: 07/15/2021 CLINICAL DATA:  Screening. EXAM: DIGITAL SCREENING BILATERAL MAMMOGRAM WITH TOMOSYNTHESIS AND CAD TECHNIQUE: Bilateral screening digital craniocaudal and mediolateral oblique mammograms were obtained. Bilateral screening digital breast tomosynthesis was performed. The images were evaluated with computer-aided detection. COMPARISON:  Previous exam(s). ACR Breast Density Category c: The breast tissue is heterogeneously dense, which may obscure small masses. FINDINGS: There are no findings suspicious for malignancy. IMPRESSION: No mammographic evidence of malignancy. A result letter of this screening mammogram will be mailed directly to the patient. RECOMMENDATION: Screening mammogram in one year. (Code:SM-B-01Y) BI-RADS CATEGORY  1: Negative. Electronically Signed   By: Ammie Ferrier M.D.   On: 07/15/2021 13:07     ASSESSMENT & PLAN: 1.  Stage Ia triple negative breast cancer of the right breast - Status post lumpectomy and sentinel lymph node biopsy by Dr. Anthony Sar on 05/13/2015. - Status post adjuvant chemotherapy  with dose dense AC followed by 3 cycles of dose dense docetaxel from 06/25/2015-11/12/2015. - Last mammogram was dated 07/14/2021 which was BI-RADS Category 1 negative; no mammographic evidence of malignancy in either breast, dense breast tissue noted. - Physical examination today is within normal limits lumpectomy scar in the right breast upper outer quadrant is stable.  No palpable masses.    - No red flag symptoms  of recurrence per patient report    - Most recent labs (07/07/2021): CBC unremarkable, CMP at baseline with normal kidney function and normal LFTs.   - PLAN: Next mammogram due July 2024, but we will see her for repeat labs, physical exam, and office visit in 1 year.  2.  Iron deficiency state -This is from intermittent blood loss from ulcerative colitis. - She has not had any recent bleeding per rectum from her ulcerative colitis.   - Last Feraheme was on 06/13/2019 and 06/20/2019. - Most recent labs (07/07/2021): Hemoglobin 12.4, ferritin 160, iron saturation 26 % - PLAN: No indication for IV iron at this time.  Repeat labs at follow-up visit in 1 year.  3.  Vitamin B12 deficiency - She was previously taking vitamin B12 cyanocobalamin 1 mg daily, but stopped taking it for unknown reasons - Most recent labs (07/07/2021) show normal vitamin B12 at 832 with normal methylmalonic acid. - PLAN: No indication for B12 supplementation at this time.  We will recheck at follow-up.    4.  Other history   - PMH: Ulcerative colitis (followed closely by GI), depression, insomnia, type 1 diabetes  5.  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  Message has been sent to her endocrinologist (Dr. Dorris Fetch).   PLAN SUMMARY & DISPOSITION: Labs (CBC, CMP, iron panel, B12/methylmalonic acid, vitamin D) in July 2024 Mammogram in July  2024 Office visit in January 2024, after labs  All questions were answered. The patient knows to call the clinic with any problems, questions or concerns.  Medical decision making: Moderate  Time spent on visit: I spent 20 minutes counseling the patient face to face. The total time spent in the appointment was 30 minutes and more than 50% was on counseling.   Harriett Rush, PA-C  07/22/2021 2:27 PM

## 2021-07-22 ENCOUNTER — Inpatient Hospital Stay (HOSPITAL_BASED_OUTPATIENT_CLINIC_OR_DEPARTMENT_OTHER): Payer: Medicaid Other | Admitting: Physician Assistant

## 2021-07-22 VITALS — BP 123/89 | HR 77 | Temp 98.9°F | Resp 18 | Wt 186.7 lb

## 2021-07-22 DIAGNOSIS — C50411 Malignant neoplasm of upper-outer quadrant of right female breast: Secondary | ICD-10-CM

## 2021-07-22 DIAGNOSIS — D500 Iron deficiency anemia secondary to blood loss (chronic): Secondary | ICD-10-CM | POA: Diagnosis not present

## 2021-07-22 DIAGNOSIS — E538 Deficiency of other specified B group vitamins: Secondary | ICD-10-CM | POA: Diagnosis not present

## 2021-07-22 DIAGNOSIS — Z171 Estrogen receptor negative status [ER-]: Secondary | ICD-10-CM | POA: Diagnosis not present

## 2021-07-22 NOTE — Patient Instructions (Addendum)
[place] at [facility] Discharge Instructions  You were seen today by Tarri Abernethy PA-C for the following conditions.  HISTORY OF BREAST CANCER: There were no abnormalities on physical exam.  Your lab work looked great!  You will be due for another mammogram in June 2024, and we will see you for follow-up visit after mammogram.  IRON DEFICIENCY: Your iron and blood levels look good.  You do not need any IV iron at this time.  We will check your levels again at your follow-up visit in 1 year.  FOLLOW-UP APPOINTMENT: Office visit in 1 year, after labs and mammogram   - - - - - - - - - - - - - - - - - -    Thank you for choosing [place] at [facility] to provide your oncology and hematology care.  To afford each patient quality time with our provider, please arrive at least 15 minutes before your scheduled appointment time.   If you have a lab appointment with the [facility] please come in thru the Main Entrance and check in at the main information desk.  You need to re-schedule your appointment should you arrive 10 or more minutes late.  We strive to give you quality time with our providers, and arriving late affects you and other patients whose appointments are after yours.  Also, if you no show three or more times for appointments you may be dismissed from the clinic at the providers discretion.     Again, thank you for choosing [facility].  Our hope is that these requests will decrease the amount of time that you wait before being seen by our physicians.       _____________________________________________________________  Should you have questions after your visit to [facility], please contact our office at [phone] and follow the prompts.  Our office hours are 8:00 a.m. and 4:30 p.m. Monday - Friday.  Please note that voicemails left after 4:00 p.m. may not be returned until the following business  day.  We are closed weekends and major holidays.  You do have access to a nurse 24-7, just call the main number to the clinic [phone] and do not press any options, hold on the line and a nurse will answer the phone.    For prescription refill requests, have your pharmacy contact our office and allow 72 hours.    Due to Covid, you will need to wear a mask upon entering the hospital. If you do not have a mask, a mask will be given to you at the Main Entrance upon arrival. For doctor visits, patients may have 1 support person age 6 or older with them. For treatment visits, patients can not have anyone with them due to social distancing guidelines and our immunocompromised population.

## 2021-08-11 ENCOUNTER — Telehealth: Payer: Self-pay | Admitting: "Endocrinology

## 2021-08-11 NOTE — Telephone Encounter (Deleted)
error 

## 2021-09-07 LAB — HEPATIC FUNCTION PANEL
ALT: 14 U/L (ref 7–35)
AST: 13 (ref 13–35)
Alkaline Phosphatase: 95 (ref 25–125)
Bilirubin, Total: 0.6

## 2021-09-07 LAB — BASIC METABOLIC PANEL
BUN: 13 (ref 4–21)
CO2: 33 — AB (ref 13–22)
Chloride: 104 (ref 99–108)
Creatinine: 0.8 (ref 0.5–1.1)
Glucose: 198
Potassium: 5 mEq/L (ref 3.5–5.1)
Sodium: 141 (ref 137–147)

## 2021-09-07 LAB — COMPREHENSIVE METABOLIC PANEL
Albumin: 4 (ref 3.5–5.0)
Calcium: 9.5 (ref 8.7–10.7)
Globulin: 2.4

## 2021-09-07 LAB — HEMOGLOBIN A1C: Hemoglobin A1C: 8.3

## 2021-09-07 LAB — LIPID PANEL
Cholesterol: 199 (ref 0–200)
HDL: 75 — AB (ref 35–70)
LDL Cholesterol: 113
Triglycerides: 56 (ref 40–160)

## 2021-09-23 ENCOUNTER — Ambulatory Visit: Payer: Medicaid Other | Admitting: "Endocrinology

## 2021-10-01 ENCOUNTER — Other Ambulatory Visit: Payer: Self-pay | Admitting: "Endocrinology

## 2021-10-12 ENCOUNTER — Ambulatory Visit: Payer: Medicaid Other | Admitting: "Endocrinology

## 2021-10-12 ENCOUNTER — Encounter: Payer: Self-pay | Admitting: "Endocrinology

## 2021-10-12 VITALS — BP 134/86 | HR 76 | Ht 61.0 in | Wt 191.8 lb

## 2021-10-12 DIAGNOSIS — Z6835 Body mass index (BMI) 35.0-35.9, adult: Secondary | ICD-10-CM

## 2021-10-12 DIAGNOSIS — E1065 Type 1 diabetes mellitus with hyperglycemia: Secondary | ICD-10-CM

## 2021-10-12 DIAGNOSIS — E559 Vitamin D deficiency, unspecified: Secondary | ICD-10-CM | POA: Diagnosis not present

## 2021-10-12 DIAGNOSIS — E782 Mixed hyperlipidemia: Secondary | ICD-10-CM

## 2021-10-12 NOTE — Progress Notes (Signed)
10/12/2021, 1:23 PM     Endocrinology follow-up note  Subjective:    Patient ID: Brittney [name], female    DOB: November 07, 1970.  Brittney [name] is being seen in follow-up in the management of currently uncontrolled symptomatic type 1 diabetes, hypertension, hyperlipidemia. PMD: [name], MD.   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) [facility])     Past Surgical History:  Procedure Laterality Date   BIOPSY  12/05/2018   Procedure: BIOPSY;  Surgeon: Rogene Houston, MD;  Location[facility];  Service: Endoscopy;;  duodenum gastric colon   BREAST LUMPECTOMY Right 2017   CESAREAN SECTION     COLONOSCOPY N/A 10/09/2013   Procedure: COLONOSCOPY;  Surgeon: Rogene Houston, MD;  Location: [facility];  Service: Endoscopy;  Laterality: N/A;  100   COLONOSCOPY N/A 12/05/2018   Procedure: COLONOSCOPY;  Surgeon: Rogene Houston, MD;  Location: [facility];  Service: Endoscopy;  Laterality: N/A;   DILATION AND CURETTAGE OF UTERUS     x 2 for miscarriages   ESOPHAGOGASTRODUODENOSCOPY     ESOPHAGOGASTRODUODENOSCOPY N/A 12/05/2018   Procedure: ESOPHAGOGASTRODUODENOSCOPY (EGD);  Surgeon: Rogene Houston, MD;  Location: [facility];  Service: Endoscopy;  Laterality: N/A;  200pm   FLEXIBLE SIGMOIDOSCOPY     FLEXIBLE SIGMOIDOSCOPY  07/27/2011   Procedure: FLEXIBLE SIGMOIDOSCOPY;  Surgeon: Rogene Houston, MD;  Location: [facility];  Service: Endoscopy;  Laterality: N/A;  100    Social History   Socioeconomic History   Marital status: Single    Spouse name: Not on file   Number of children: Not on file   Years of education: Not on file   Highest education level: Not on file  Occupational History   Not on file  Tobacco Use   Smoking status: Never    Passive exposure: Current   Smokeless tobacco: Never  Vaping Use   Vaping Use: Never used  Substance and Sexual Activity   Alcohol use: No    Alcohol/week: 0.0 standard drinks of alcohol   Drug use: No   Sexual activity: Not on file  Other Topics Concern   Not on file  Social History Narrative   Not on file   Social Determinants of Health   Financial Resource Strain: Low Risk  (12/12/2019)   Overall Financial Resource Strain (CARDIA)    Difficulty of Paying Living Expenses: Not hard at all  Food Insecurity: No Food Insecurity (12/12/2019)   Hunger Vital Sign    Worried About Running Out of Food in the Last Year: Never  true    Ran Out of Food in the Last Year: Never true  Transportation Needs: No Transportation Needs (12/12/2019)   PRAPARE - Hydrologist (Medical): No    Lack of Transportation (Non-Medical): No  Physical Activity: Inactive (12/12/2019)   Exercise Vital Sign    Days of Exercise per Week: 0 days    Minutes of Exercise per Session: 0 min  Stress: No Stress Concern Present (12/12/2019)   [place]    Feeling of Stress : Not at all  Social Connections: Moderately Integrated (12/12/2019)   Social Connection and Isolation Panel [NHANES]    Frequency of Communication with Friends and Family: More than three times a week    Frequency of Social Gatherings with Friends and Family: Three times a week    Attends Religious Services: 1 to 4 times per year    Active Member of Clubs or Organizations: No    Attends Music therapist: 1  to 4 times per year    Marital Status: Never married    Family History  Problem Relation Age of Onset   Non-Hodgkin's lymphoma Mother    Hypertension Mother    Diabetes Mellitus II Mother    Hypertension Father    Diabetes Mellitus II Father    Breast cancer Cousin    Breast cancer Maternal Aunt    Colon cancer Maternal Uncle     Outpatient Encounter Medications as of 10/12/2021  Medication Sig   ACCU-CHEK AVIVA PLUS test strip SMARTSIG:Via Meter 6 Times Daily   Accu-Chek Softclix Lancets lancets 4 (four) times daily.   acetaminophen (TYLENOL) 500 MG tablet Take 1,000 mg by mouth every 6 (six) hours as needed. For pain   acyclovir (ZOVIRAX) 200 MG capsule Take 400 mg by mouth daily. Reported on 07/23/2015   albuterol (PROVENTIL HFA;VENTOLIN HFA) 108 (90 Base) MCG/ACT inhaler Inhale 2 puffs into the lungs every 4 (four) hours as needed for wheezing or shortness of breath.   atorvastatin (LIPITOR) 20 MG tablet Take 20 mg by mouth daily.    AUVELITY 45-105 MG TBCR Take by mouth.   Blood Glucose Monitoring Suppl (ACCU-CHEK GUIDE) w/Device KIT USE TO CHECK SUGAR   Continuous Blood Gluc Receiver (DEXCOM G6 RECEIVER) DEVI 1 Piece by Does not apply route as needed.   Continuous Blood Gluc Sensor (DEXCOM G6 SENSOR) MISC USE AS DIRECTED, CHANGE SENSOR EVERY 10 DAYS   Continuous Blood Gluc Transmit (DEXCOM G6 TRANSMITTER) MISC USE AS DIRECTED, CHANGE EVERY 90 DAYS   diclofenac Sodium (VOLTAREN) 1 % GEL Apply 1 application topically 3 (three) times daily.   gabapentin (NEURONTIN) 300 MG capsule Take 300-600 mg by mouth See admin instructions. Take 1 capsule (300 mg) by mouth in the morning & take 2 capsules (600 mg) by mouth at night   HUMALOG 100 UNIT/ML injection Inject 12-15 Units into the skin 3 (three) times daily with meals. Sliding Scale Insulin   hydrocortisone (CORTENEMA) 100 MG/60ML enema Place 1 enema (100 mg total) rectally at bedtime.   LANTUS SOLOSTAR 100 UNIT/ML Solostar Pen Inject  24 Units into the skin at bedtime.   loratadine (CLARITIN) 10 MG tablet Take 10 mg by mouth daily.    losartan (COZAAR) 50 MG tablet TAKE 1 TABLET BY MOUTH EVERY DAY   mesalamine (CANASA) 1000 MG suppository Place 1 suppository (1,000 mg total) rectally at bedtime. Take 3x/week.   Mesalamine 800 MG TBEC TAKE TWO (2)  TABLETS BY MOUTH IN THE MORNING, AT NOON, AND AT BEDTIME   omeprazole (PRILOSEC) 20 MG capsule TAKE ONE CAPSULE BY MOUTH TWICE DAILY BEFORE A MEAL   ondansetron (ZOFRAN-ODT) 8 MG disintegrating tablet Take 8 mg by mouth every 8 (eight) hours as needed.   Facility-Administered Encounter Medications as of 10/12/2021  Medication   0.9 %  sodium chloride infusion   0.9 %  sodium chloride infusion    ALLERGIES: Allergies  Allergen Reactions   Ace Inhibitors Itching   Peanut Oil Itching   Shellfish Allergy Itching and Rash   Statins Itching   Adhesive [Tape] Rash   Pravastatin Sodium Itching   Pedi-Pre Tape Spray [Wound Dressing Adhesive]    Shellfish-Derived Products     VACCINATION STATUS: Immunization History  Administered Date(s) Administered   Influenza,inj,Quad PF,6+ Mos 10/22/2015   Influenza-Unspecified 10/16/2019   Pneumococcal Polysaccharide-23 01/03/2006   Td 10/04/2006   Tdap 07/10/1996    Diabetes She presents for her follow-up diabetic visit. She has type 1 diabetes mellitus. Onset time: She was diagnosed at approximate age of 32 years. Her disease course has been fluctuating. Pertinent negatives for hypoglycemia include no confusion, nervousness/anxiousness, pallor or seizures. Associated symptoms include fatigue, polydipsia and polyuria. Pertinent negatives for diabetes include no chest pain and no polyphagia. There are no hypoglycemic complications. Symptoms are worsening. There are no diabetic complications. Risk factors for coronary artery disease include diabetes mellitus, dyslipidemia, obesity and sedentary lifestyle. Current diabetic treatment  includes insulin injections. Her weight is increasing steadily. She is following a generally unhealthy diet. When asked about meal planning, she reported none. She has not had a previous visit with a dietitian. Her home blood glucose trend is fluctuating minimally. Her breakfast blood glucose range is generally >200 mg/dl. Her lunch blood glucose range is generally >200 mg/dl. Her dinner blood glucose range is generally 140-180 mg/dl. Her bedtime blood glucose range is generally >200 mg/dl. Her overall blood glucose range is >200 mg/dl. (Brittney [name] presents with still fluctuating glycemic profile.  She wears a Dexcom CGM.  Her analysis shows 31% in range, 65% above range, 3% level 1 hypoglycemia, 1% level 2 hypoglycemia.  This patient has hypoglycemia unawareness.  Her average blood glucose is 239 for the last 14 days.  Her point-of-care A1c is 8.3%, unchanged from her prior visits.     ) An ACE inhibitor/angiotensin II receptor blocker is not being taken.  Hyperlipidemia This is a chronic problem. The current episode started more than 1 year ago. The problem is controlled. Pertinent negatives include no chest pain, myalgias or shortness of breath. Current antihyperlipidemic treatment includes statins (She was treated with atorvastatin in the past, did not continue on this medication complaining about cost.). Risk factors for coronary artery disease include diabetes mellitus, dyslipidemia, obesity and family history.    Review of systems  Constitutional: + Minimally fluctuating body weight,  current  Body mass index is 36.24 kg/m. , no fatigue, no subjective hyperthermia, no subjective hypothermia    Objective:    BP 134/86   Pulse 76   Ht 5' 1"  (1.549 m)   Wt 191 lb 12.8 oz (87 kg)   LMP 09/04/2015 (Approximate)   BMI 36.24 kg/m   Wt Readings from Last 3 Encounters:  10/12/21 191 lb 12.8 oz (87 kg)  07/22/21 186 lb 11.2 oz (84.7 kg)  06/22/21 186 lb (84.4 kg)     Physical Exam-  Limited  Constitutional:  Body mass index is 36.24 kg/m. ,  not in acute distress, normal state of mind   Recent Results (from the past 2160 hour(s))  Basic metabolic panel     Status: Abnormal   Collection Time: 09/07/21 12:00 AM  Result Value Ref Range   Glucose 198    BUN 13 4 - 21   CO2 33 (A) 13 - 22   Creatinine 0.8 0.5 - 1.1   Potassium 5.0 3.5 - 5.1 mEq/L   Sodium 141 137 - 147   Chloride 104 99 - 108  Comprehensive metabolic panel     Status: None   Collection Time: 09/07/21 12:00 AM  Result Value Ref Range   Globulin 2.40    Calcium 9.5 8.7 - 10.7   Albumin 4.0 3.5 - 5.0  Lipid panel     Status: Abnormal   Collection Time: 09/07/21 12:00 AM  Result Value Ref Range   Triglycerides 56 40 - 160   Cholesterol 199 0 - 200   HDL 75 (A) 35 - 70   LDL Cholesterol 113   Hepatic function panel     Status: None   Collection Time: 09/07/21 12:00 AM  Result Value Ref Range   Alkaline Phosphatase 95 25 - 125   ALT 14 7 - 35 U/L   AST 13 13 - 35   Bilirubin, Total 0.6   Hemoglobin A1c     Status: None   Collection Time: 09/07/21 12:00 AM  Result Value Ref Range   Hemoglobin A1C 8.30      Diabetic Labs (most recent): Lab Results  Component Value Date   HGBA1C 8.30 09/07/2021   HGBA1C 8.5 (A) 06/22/2021   HGBA1C 8.0 (A) 03/22/2021   MICROALBUR 80 06/09/2020   MICROALBUR 2.7 12/02/2019   MICROALBUR 2.3 05/23/2018     Lipid Panel ( most recent) Lipid Panel     Component Value Date/Time   CHOL 199 09/07/2021 0000   TRIG 56 09/07/2021 0000   HDL 75 (A) 09/07/2021 0000   CHOLHDL 2.3 08/29/2019 0942   LDLCALC 113 09/07/2021 0000   LDLCALC 73 08/29/2019 0942      Latest Ref Rng & Units 09/07/2021   12:00 AM 07/07/2021    9:47 AM 01/20/2021   10:53 AM  CMP  Glucose 70 - 99 mg/dL  339  195   BUN 4 - 21 13     11  16    Creatinine 0.5 - 1.1 0.8     0.64  0.83   Sodium 137 - 147 141     139  141   Potassium 3.5 - 5.1 mEq/L 5.0     4.2  4.0   Chloride 99 - 108 104      103  104   CO2 13 - 22 33     28  27   Calcium 8.7 - 10.7 9.5     8.9  8.9   Total Protein 6.5 - 8.1 g/dL  6.6  6.7   Total Bilirubin 0.3 - 1.2 mg/dL  0.6  0.5   Alkaline Phos 25 - 125 95     96  104   AST 13 - 35 13     22  20    ALT 7 - 35 U/L 14     17  17       This result is from an external source.      Assessment & Plan:   1. Uncontrolled type 1 diabetes mellitus with hyperglycemia (HCC)  - Brittney [name] has  currently uncontrolled symptomatic type 2 DM since 51 years of age.  Brittney [name] presents with still fluctuating glycemic profile.  She wears a Dexcom CGM.  Her analysis shows 31% in range, 65% above range, 3% level 1 hypoglycemia, 1% level 2 hypoglycemia.  This patient has hypoglycemia unawareness.  Her average blood glucose is 239 for the last 14 days.  Her point-of-care A1c is 8.3%, unchanged from her prior visits.       Her recent anti-GAD antibody elevated at 61 suggesting type 1 diabetes.   -her diabetes is complicated by obesity/sedentary life and she remains at a high risk for more acute and chronic complications which include CAD, CVA, CKD, retinopathy, and neuropathy. These are all discussed in detail with her.  - I have counseled her on diet management and weight loss, by adopting a carbohydrate restricted/protein rich diet.  - she acknowledges that there is a room for improvement in her food and drink choices. - Suggestion is made for her to avoid simple carbohydrates  from her diet including Cakes, Sweet Desserts, Ice Cream, Soda (diet and regular), Sweet Tea, Candies, Chips, Cookies, Store Bought Juices, Alcohol in Excess of  1-2 drinks a day, Artificial Sweeteners,  Coffee Creamer, and "Sugar-free" Products, Lemonade. This will help patient to have more stable blood glucose profile and potentially avoid unintended weight gain.  - I encouraged her to switch to  unprocessed or minimally processed complex starch and increased protein intake (animal or plant  source), fruits, and vegetables.  - she is advised to stick to a routine mealtimes to eat 3 meals  a day and avoid unnecessary snacks ( to snack only to correct hypoglycemia).    - I have approached her with the following individualized plan to manage diabetes and patient agrees:   -She will continue to need intensive treatment with basal/bolus insulin in order for her to achieve and maintain control of diabetes to target.     -Based on her presentation with hypoglycemia unawareness, fluctuating glycemic profile, she will continue to benefit from CGM.  She is advised to wear her Dexcom device all the time.   -  She is known to have hypoglycemia unawareness.  She is advised to continue Lantus 24 units nightly, continue Humalog 12  units for pre-meal blood glucose readings above 90 mg/day.  He is allowed to use tight scale  sliding scale Humalog for  hyperglycemia above 150 mg per DL.  She is advised to continue monitoring blood glucose 4 times a day-before meals and at bedtime.  - she is warned not to take insulin without proper monitoring per orders. - Adjustment parameters are given to her for hypo and hyperglycemia in writing. - she is encouraged to call clinic for blood glucose levels less than 70 or above 200 mg /dl.  - she is not a suitable candidate  for metformin, SGLT2 inhibitors, nor incretin therapy.   - Patient specific target  A1c;  LDL, HDL, Triglycerides, were discussed in detail.  2) Blood Pressure /Hypertension:  -Her blood pressure is controlled to target.  She has urine microalbuminuria.  She is currently  on losartan 50 mg daily.     3) Lipids/Hyperlipidemia: Review of her most recent lipid panel showed LDL improved to 73, overall improving from 119.  She is benefiting from statins.  She is advised to continue atorvastatin 20 mg p.o. nightly. Side effects and precautions discussed with her.         4)  Weight/Diet: Her BMI  is   82.50-  clearly complicating her  diabetes care.  A candidate for modest weight loss, may avoid further weight gain by avoiding unnecessary snacks.  I discussed with her the fact that loss of 5 - 10% of her  current body weight will have the most impact on her diabetes management.   CDE Consult will be initiated . Exercise, and detailed carbohydrates information provided  -  detailed on discharge instructions.  5) Chronic Care/Health Maintenance:  -she  Is not on ACEI/ARB and Statin medications and  is encouraged to initiate and continue to follow up with Ophthalmology, Dentist,  Podiatrist at least yearly or according to recommendations, and advised to   stay away from smoking. I have recommended yearly flu vaccine and pneumonia vaccine at least every 5 years; moderate intensity exercise for up to 150 minutes weekly; and  sleep for at least 7 hours a day.  - she is  advised to maintain close follow up with [name], MD for primary care needs, as well as her other providers for optimal and coordinated care.   I spent 41 minutes in the care of the patient today including review of labs from [place], Lipids, Thyroid Function, Hematology (current and previous including abstractions from other facilities); face-to-face time discussing  her blood glucose readings/logs, discussing hypoglycemia and hyperglycemia episodes and symptoms, medications doses, her options of short and long term treatment based on the latest standards of care / guidelines;  discussion about incorporating lifestyle medicine;  and documenting the encounter. Risk reduction counseling performed per USPSTF guidelines to reduce  obesity and cardiovascular risk factors.     Please refer to Patient Instructions for Blood Glucose Monitoring and Insulin/Medications Dosing Guide"  in media tab for additional information. Please  also refer to " Patient Self Inventory" in the Media  tab for reviewed elements of pertinent patient history.  [name] participated in  the discussions, expressed understanding, and voiced agreement with the above plans.  All questions were answered to her satisfaction. she is encouraged to contact clinic should she have any questions or concerns prior to her return visit.   Follow up plan: - Return in about 4 months (around 02/12/2022) for Bring Meter/CGM Device/Logs- A1c in Office.  Glade Lloyd, MD [facility] [address] [place], [place] [address] Phone: [phone]  Fax: [phone]    10/12/2021, 1:23 PM  This note was partially dictated with voice recognition software. Similar sounding words can be transcribed inadequately or may not  be corrected upon review.

## 2021-10-12 NOTE — Patient Instructions (Signed)

## 2021-12-10 ENCOUNTER — Other Ambulatory Visit: Payer: Self-pay | Admitting: "Endocrinology

## 2021-12-13 ENCOUNTER — Ambulatory Visit (INDEPENDENT_AMBULATORY_CARE_PROVIDER_SITE_OTHER): Payer: Medicaid Other | Admitting: Gastroenterology

## 2021-12-13 ENCOUNTER — Encounter (INDEPENDENT_AMBULATORY_CARE_PROVIDER_SITE_OTHER): Payer: Self-pay | Admitting: Gastroenterology

## 2021-12-13 VITALS — BP 129/84 | HR 76 | Temp 97.3°F | Ht 62.0 in | Wt 192.5 lb

## 2021-12-13 DIAGNOSIS — R140 Abdominal distension (gaseous): Secondary | ICD-10-CM | POA: Diagnosis not present

## 2021-12-13 DIAGNOSIS — K5130 Ulcerative (chronic) rectosigmoiditis without complications: Secondary | ICD-10-CM | POA: Diagnosis not present

## 2021-12-13 MED ORDER — MESALAMINE 1000 MG RE SUPP: 1000.0000 mg | Freq: Every day | RECTAL | 1 refills | Status: DC

## 2021-12-13 NOTE — Patient Instructions (Addendum)
Continue mesalamine 1.6 g TID Start [place] every night for one month, then decrease to every other night. If persistent symptoms, will need to repeat colonoscopy

## 2021-12-13 NOTE — Progress Notes (Signed)
[name], M.D. Gastroenterology & Hepatology [facility] [address] [place], [place] [address]  Primary Care Physician: [name], MD [place] [id number]  I will communicate my assessment and recommendations to the referring MD via EMR.  Problems: Ulcerative proctosigmoiditis  History of Present Illness: Brittney [name] is a 51 y.o. female with past medical history of ulcerative proctosigmoiditis, breast cancer, GERD, gastric disease, type 1 diabetes, who presents for follow up of ulcerative proctosigmoiditis.  The patient was last seen on 06/15/2021 (Dr. Laural Golden). At that time, the patient was advised to continue mesalamine 1600 mg 3 times daily orally.  She report for multiple months she has had significant bloating and also reports having significant pressure sensation with urgency in her rectum. She is having a bowel movement possibly twice a day, which has a soft consistency. She has seen some mucus and scant blood in her stool. She also reports occasional sharp pains in her lower abdomen. She also reports having occasional nausea without vomiting.  She is using mesalamine 1600 mg TID compliantly. She reports "occasionally" uses Canasa (possibly 3 times a day) and may have urgency on the days she does not take it.  The patient denies having any vomiting, fever, chills,  melena, hematemesis, diarrhea, jaundice, pruritus or weight loss.  Most recent labs from 09/07/2021 showed sodium of 141, potassium 5.0, creatinine 0.8, BUN 13, AST 13, ALT 14, total bilirubin 0.6.  Last EGD: 12/05/2018 - Normal esophagus. - Z-line irregular, 40 cm from the incisors. - Portal hypertensive gastropathy. - A few gastric polyps. Biopsied. - Normal duodenal bulb and second portion of the duodenum. Biopsied.  Last Colonoscopy: 12/05/2018 - The examined portion of the ileum was normal. - The proximal sigmoid colon, mid sigmoid colon,  descending colon, splenic flexure, transverse colon, hepatic flexure, ascending colon, cecum, appendiceal orifice and ileocecal valve are normal. - Congested mucosa in the distal sigmoid colon. Biopsied. 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 Biopsied. - Anal papilla(e) were hypertrophied.  Path   A. DUODENUM, BIOPSY:  - Duodenal mucosa with no significant pathologic findings.  - Negative for increased intraepithelial lymphocytes and villous  architectural changes.   B. STOMACH, POLYPECTOMY:  - Fundic gland polyps.   C. COLON, BIOPSY:  - Mildly active chronic colitis.  - Negative for granulomata and dysplasia.   D. RECTUM, BIOPSY:  - Moderately active chronic colitis.  - Negative for granulomata and dysplasia.   Past Medical History: Past Medical History:  Diagnosis Date   Breast cancer ([place])    Breast cancer of upper-outer quadrant of right female breast ([place]) 06/11/2015   Triple negative, 2 cm    Chemotherapy induced neutropenia ([place]) 09/10/2015   Diabetes mellitus ([place])    Type 1 over 15 yrs   Environmental allergies    GERD (gastroesophageal reflux disease)    Iron deficiency anemia due to chronic blood loss 06/12/2015   Personal history of chemotherapy    Personal history of radiation therapy    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) [facility])     Past Surgical History: Past Surgical History:  Procedure Laterality Date   BIOPSY  12/05/2018   Procedure: BIOPSY;  Surgeon: Rogene Houston, MD;  [facility];  Service: Endoscopy;;  duodenum gastric colon   BREAST LUMPECTOMY Right 2017   CESAREAN SECTION     COLONOSCOPY N/A 10/09/2013   Procedure: COLONOSCOPY;  Surgeon: Rogene Houston,  MD;  [facility];  Service: Endoscopy;  Laterality: N/A;  100   COLONOSCOPY N/A 12/05/2018   Procedure: COLONOSCOPY;  Surgeon: Rogene Houston, MD;  [facility];  Service: Endoscopy;  Laterality: N/A;   DILATION AND  CURETTAGE OF UTERUS     x 2 for miscarriages   ESOPHAGOGASTRODUODENOSCOPY     ESOPHAGOGASTRODUODENOSCOPY N/A 12/05/2018   Procedure: ESOPHAGOGASTRODUODENOSCOPY (EGD);  Surgeon: Rogene Houston, MD;  [facility];  Service: Endoscopy;  Laterality: N/A;  200pm   FLEXIBLE SIGMOIDOSCOPY     FLEXIBLE SIGMOIDOSCOPY  07/27/2011   Procedure: FLEXIBLE SIGMOIDOSCOPY;  Surgeon: Rogene Houston, MD;  [facility];  Service: Endoscopy;  Laterality: N/A;  100    Family History: Family History  Problem Relation Age of Onset   Non-Hodgkin's lymphoma Mother    Hypertension Mother    Diabetes Mellitus II Mother    Hypertension Father    Diabetes Mellitus II Father    Breast cancer Cousin    Breast cancer Maternal Aunt    Colon cancer Maternal Uncle     Social History: Social History   Tobacco Use  Smoking Status Never   Passive exposure: Current  Smokeless Tobacco Never   Social History   Substance and Sexual Activity  Alcohol Use No   Alcohol/week: 0.0 standard drinks of alcohol   Social History   Substance and Sexual Activity  Drug Use No    Allergies: Allergies  Allergen Reactions   Ace Inhibitors Itching   Peanut Oil Itching   Shellfish Allergy Itching and Rash   Statins Itching   Adhesive [Tape] Rash   Pravastatin Sodium Itching   Pedi-Pre Tape Spray [Wound Dressing Adhesive]    Shellfish-Derived Products     Medications: Current Outpatient Medications  Medication Sig Dispense Refill   ACCU-CHEK AVIVA PLUS test strip SMARTSIG:Via Meter 6 Times Daily     Accu-Chek Softclix Lancets lancets 4 (four) times daily.     acetaminophen (TYLENOL) 500 MG tablet Take 1,000 mg by mouth every 6 (six) hours as needed. For pain     acyclovir (ZOVIRAX) 200 MG capsule Take 400 mg by mouth daily. Reported on 07/23/2015     albuterol (PROVENTIL HFA;VENTOLIN HFA) 108 (90 Base) MCG/ACT inhaler Inhale 2 puffs into the lungs every 4 (four) hours as needed for wheezing or  shortness of breath.     atorvastatin (LIPITOR) 20 MG tablet Take 20 mg by mouth daily.      AUVELITY 45-105 MG TBCR Take by mouth in the morning and at bedtime.     Blood Glucose Monitoring Suppl (ACCU-CHEK GUIDE) w/Device KIT USE TO CHECK SUGAR     Continuous Blood Gluc Receiver (DEXCOM G6 RECEIVER) DEVI 1 Piece by Does not apply route as needed. 1 each 0   Continuous Blood Gluc Sensor (DEXCOM G6 SENSOR) MISC USE AS DIRECTED, CHANGE SENSOR EVERY 10 DAYS 3 each 2   Continuous Blood Gluc Transmit (DEXCOM G6 TRANSMITTER) MISC USE AS DIRECTED, CHANGE EVERY 90 DAYS 1 each 1   diclofenac Sodium (VOLTAREN) 1 % GEL Apply 1 application topically 3 (three) times daily.     gabapentin (NEURONTIN) 300 MG capsule Take 300-600 mg by mouth See admin instructions. Take 1 capsule (300 mg) by mouth in the morning & take 2 capsules (600 mg) by mouth at night  6   HUMALOG 100 UNIT/ML injection Inject 12-15 Units into the skin 3 (three) times daily with meals. Sliding Scale Insulin  6   hydrocortisone (CORTENEMA) 100 MG/60ML enema Place 1 enema (100 mg total) rectally at bedtime. 60 mL 0   LANTUS SOLOSTAR 100 UNIT/ML Solostar Pen Inject 24 Units into the skin at bedtime.  6   loratadine (CLARITIN) 10 MG tablet Take 10 mg by mouth daily.   6   losartan (COZAAR) 50 MG tablet TAKE 1 TABLET BY MOUTH EVERY DAY 90 tablet 0   mesalamine (CANASA) 1000 MG suppository Place 1 suppository (1,000 mg total) rectally at bedtime. Take 3x/week. 30 suppository 2   Mesalamine 800 MG TBEC TAKE TWO (2) TABLETS BY MOUTH IN THE MORNING, AT NOON, AND AT BEDTIME 180 tablet 5   omeprazole (PRILOSEC) 20 MG capsule TAKE ONE CAPSULE BY MOUTH TWICE DAILY BEFORE A MEAL 180 capsule 3   ondansetron (ZOFRAN-ODT) 8 MG disintegrating tablet Take 8 mg by mouth every 8 (eight) hours as needed.     No current facility-administered medications for this visit.   Facility-Administered Medications Ordered in Other Visits  Medication Dose Route Frequency  Provider Last Rate Last Admin   0.9 %  sodium chloride infusion   Intravenous Continuous Kefalas, Manon Hilding, PA-C       0.9 %  sodium chloride infusion   Intravenous Continuous Higgs, Vetta, MD   Stopped at 05/26/17 1510    Review of Systems: GENERAL: negative for malaise, night sweats HEENT: No changes in hearing or vision, no nose bleeds or other nasal problems. NECK: Negative for lumps, goiter, pain and significant neck swelling RESPIRATORY: Negative for cough, wheezing CARDIOVASCULAR: Negative for chest pain, leg swelling, palpitations, orthopnea GI: SEE HPI MUSCULOSKELETAL: Negative for joint pain or swelling, back pain, and muscle pain. SKIN: Negative for lesions, rash PSYCH: Negative for sleep disturbance, mood disorder and recent psychosocial stressors. HEMATOLOGY Negative for prolonged bleeding, bruising easily, and swollen nodes. ENDOCRINE: Negative for cold or heat intolerance, polyuria, polydipsia and goiter. NEURO: negative for tremor, gait imbalance, syncope and seizures. The remainder of the review of systems is noncontributory.   Physical Exam: BP 129/84 (BP Location: Left Arm, Patient Position: Sitting, Cuff Size: Large)   Pulse 76   Temp (!) 97.3 F (36.3 C) (Temporal)   Ht _0  (1.575 m)   Wt 192 lb 8 oz (87.3 kg)   LMP 09/04/2015 (Approximate)   BMI 35.21 kg/m  GENERAL: The patient is AO x3, in no acute distress. HEENT: Head is normocephalic and atraumatic. EOMI are intact. Mouth is well hydrated and without lesions. NECK: Supple. No masses LUNGS: Clear to auscultation. No presence of rhonchi/wheezing/rales. Adequate chest expansion HEART: RRR, normal s1 and s2. ABDOMEN: Soft, nontender, no guarding, no peritoneal signs, and nondistended. BS +. No masses. EXTREMITIES: Without any cyanosis, clubbing, rash, lesions or edema. NEUROLOGIC: AOx3, no focal motor deficit. SKIN: no jaundice, no rashes  Imaging/Labs: as above  I personally reviewed and  interpreted the available labs, imaging and endoscopic files.  Impression and Plan: Brittney [name] is a 51 y.o. female with past medical history of ulcerative proctosigmoiditis, breast cancer, GERD, gastric disease, type 1 diabetes, who presents for follow up of ulcerative proctosigmoiditis.  Patient has presented recurrent gastrointestinal symptoms which raises concern for uncontrolled ulcerative colitis.  She has been taking her oral mesalamine compliantly but has been taking the topical therapy intermittently.  We discussed the possibility of increasing her Canasa to daily dosing and decrease to every other day, while concomitantly continuing her current oral mesalamine treatment.  Patient is agreeable to proceed with this.  If her symptoms do not improve, we will need to repeat colonoscopy to assess ulcerative colitis disease activity to determine if there is any other concomitant shoulder or if her treatment should be changed.  - Continue mesalamine 1.6 g TID - Start Canasa every night for one month, then decrease to every other night. - If persistent symptoms, will need to repeat colonoscopy  All questions were answered.      [name], MD Gastroenterology and Hepatology [facility]

## 2022-01-07 ENCOUNTER — Other Ambulatory Visit: Payer: Self-pay | Admitting: "Endocrinology

## 2022-01-13 ENCOUNTER — Telehealth (INDEPENDENT_AMBULATORY_CARE_PROVIDER_SITE_OTHER): Payer: Self-pay | Admitting: *Deleted

## 2022-01-13 NOTE — Telephone Encounter (Signed)
Patient seen 12/13/21 and she called today to ask about enema being sent to pharmacy. She said she wanted it sent to [facility]. I called her to clarify if she was talking about canasa suppository and she said no she thought you was going to send in rx for some enemas. I did not see on med list or anything about enema on her last note. She said it was discussed at her last ov on 12/13/21  [phone]

## 2022-01-14 NOTE — Telephone Encounter (Signed)
Discussed with patient - So in her last appointment, she mentioned she was taking the Canasa every other day, I advised her to use the suppository every night and then switch to every other night after 4 weeks. Did she notice any difference when she switched from every night to every other? If so, she needs to go back to every night. If no improvement with every night dosing, we may need to repeat a flexible sigmoidoscopy.   Patient reports she did not try every night dosing for 4 weeks but will start every night for the next 4 weeks and then call back with update on how she is doing.

## 2022-01-14 NOTE — Telephone Encounter (Signed)
So in her last appointment, she mentioned she was taking the Canasa every other day, I advised her to use the suppository every night and then switch to every other night after 4 weeks. Did she notice any difference when she switched from every night to every other? If so, she needs to go back to every night. If no improvement with every night dosing, we may need to repeat a flexible sigmoidoscopy.

## 2022-01-14 NOTE — Telephone Encounter (Signed)
Hi, The enemas are the Canasa. Is she using the Canasa enemas? Thanks

## 2022-01-14 NOTE — Telephone Encounter (Signed)
Thanks, yeah that's what I discussed with her last time to do it every night for 4 weeks - please ask hger to give [place] an update after a couple of weeks

## 2022-01-14 NOTE — Telephone Encounter (Signed)
Patient states she was using canasa supp every other night. She thought you said you wanted her to have an enema to cover a larger area due to her having having bloating, urgency, pain and nausea. She reports she is feeling about the same. She reports a little blood in stool. No dizziness or sob.

## 2022-01-18 NOTE — Telephone Encounter (Signed)
Patient aware to call and give update in a couple of weeks.

## 2022-01-31 NOTE — Telephone Encounter (Signed)
Left message to return call 

## 2022-01-31 NOTE — Telephone Encounter (Signed)
I called patient to get update on how she was doing after starting the canasa suppository every night instead of every other night and she said she is having some improvement.urgency is about the same but bloating, nausea and pain are better. She has not seen any blood in stool since doing every night.

## 2022-01-31 NOTE — Telephone Encounter (Signed)
Thanks for the update, should continue the same for now

## 2022-02-01 NOTE — Telephone Encounter (Signed)
Patient notified to continue the same for now

## 2022-02-07 ENCOUNTER — Other Ambulatory Visit: Payer: Self-pay | Admitting: "Endocrinology

## 2022-02-10 ENCOUNTER — Telehealth: Payer: Self-pay | Admitting: Nurse Practitioner

## 2022-02-10 NOTE — Telephone Encounter (Signed)
Pt states she needs a PA on her Dexcom Sensor.

## 2022-02-14 ENCOUNTER — Encounter: Payer: Self-pay | Admitting: "Endocrinology

## 2022-02-14 ENCOUNTER — Ambulatory Visit: Payer: Medicaid Other | Admitting: "Endocrinology

## 2022-02-14 VITALS — BP 160/90 | HR 64 | Ht 62.0 in | Wt 189.0 lb

## 2022-02-14 DIAGNOSIS — E1065 Type 1 diabetes mellitus with hyperglycemia: Secondary | ICD-10-CM

## 2022-02-14 DIAGNOSIS — E782 Mixed hyperlipidemia: Secondary | ICD-10-CM

## 2022-02-14 DIAGNOSIS — E559 Vitamin D deficiency, unspecified: Secondary | ICD-10-CM

## 2022-02-14 DIAGNOSIS — Z6835 Body mass index (BMI) 35.0-35.9, adult: Secondary | ICD-10-CM

## 2022-02-14 LAB — POCT GLYCOSYLATED HEMOGLOBIN (HGB A1C): HbA1c, POC (controlled diabetic range): 8.1 % — AB (ref 0.0–7.0)

## 2022-02-14 MED ORDER — HYDROCHLOROTHIAZIDE 25 MG PO TABS: 25.0000 mg | ORAL_TABLET | Freq: Every day | ORAL | 1 refills | Status: DC

## 2022-02-14 NOTE — Patient Instructions (Signed)

## 2022-02-14 NOTE — Progress Notes (Signed)
02/14/2022, 11:40 AM     Endocrinology follow-up note  Subjective:    Patient ID: Brittney [name], female    DOB: 14-Jul-1970.  Brittney [name] is being seen in follow-up in the management of currently uncontrolled symptomatic type 1 diabetes, hypertension, hyperlipidemia. PMD: [name], MD.   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) [facility])     Past Surgical History:  Procedure Laterality Date   BIOPSY  12/05/2018   Procedure: BIOPSY;  Surgeon: Rogene Houston, MD;  [facility];  Service: Endoscopy;;  duodenum gastric colon   BREAST LUMPECTOMY Right 2017   CESAREAN SECTION     COLONOSCOPY N/A 10/09/2013   Procedure: COLONOSCOPY;  Surgeon: Rogene Houston, MD;  Location: [facility];  Service: Endoscopy;  Laterality: N/A;  100   COLONOSCOPY N/A 12/05/2018   Procedure: COLONOSCOPY;  Surgeon: Rogene Houston, MD;  Location[facility];  Service: Endoscopy;  Laterality: N/A;   DILATION AND CURETTAGE OF UTERUS     x 2 for miscarriages   ESOPHAGOGASTRODUODENOSCOPY     ESOPHAGOGASTRODUODENOSCOPY N/A 12/05/2018   Procedure: ESOPHAGOGASTRODUODENOSCOPY (EGD);  Surgeon: Rogene Houston, MD;  [facility]: [facility];  Service: Endoscopy;  Laterality: N/A;  200pm   FLEXIBLE SIGMOIDOSCOPY     FLEXIBLE SIGMOIDOSCOPY  07/27/2011   Procedure: FLEXIBLE SIGMOIDOSCOPY;  Surgeon: Rogene Houston, MD;  Location: [facility];  Service: Endoscopy;  Laterality: N/A;  100    Social History   Socioeconomic History   Marital status: Single    Spouse name: Not on file   Number of children: Not on file   Years of education: Not on file   Highest education level: Not on file  Occupational History   Not on file  Tobacco Use   Smoking status: Never    Passive exposure: Current   Smokeless tobacco: Never  Vaping Use   Vaping Use: Never used  Substance and Sexual Activity   Alcohol use: No    Alcohol/week: 0.0 standard drinks of alcohol   Drug use: No   Sexual activity: Not on file  Other Topics Concern   Not on file  Social History Narrative   Not on file   Social Determinants of Health   Financial Resource Strain: Low Risk  (12/12/2019)   Overall Financial Resource Strain (CARDIA)    Difficulty of Paying Living Expenses: Not hard at all  Food Insecurity: No Food Insecurity (12/12/2019)   Hunger Vital Sign    Worried About Running Out of Food in the Last Year: Never  true    Ran Out of Food in the Last Year: Never true  Transportation Needs: No Transportation Needs (12/12/2019)   PRAPARE - Hydrologist (Medical): No    Lack of Transportation (Non-Medical): No  Physical Activity: Inactive (12/12/2019)   Exercise Vital Sign    Days of Exercise per Week: 0 days    Minutes of Exercise per Session: 0 min  Stress: No Stress Concern Present (12/12/2019)   [place]    Feeling of Stress : Not at all  Social Connections: Moderately Integrated (12/12/2019)   Social Connection and Isolation Panel [NHANES]    Frequency of Communication with Friends and Family: More than three times a week    Frequency of Social Gatherings with Friends and Family: Three times a week    Attends Religious Services: 1 to 4 times per year    Active Member of Clubs or Organizations: No    Attends Music therapist: 1  to 4 times per year    Marital Status: Never married    Family History  Problem Relation Age of Onset   Non-Hodgkin's lymphoma Mother    Hypertension Mother    Diabetes Mellitus II Mother    Hypertension Father    Diabetes Mellitus II Father    Breast cancer Cousin    Breast cancer Maternal Aunt    Colon cancer Maternal Uncle     Outpatient Encounter Medications as of 02/14/2022  Medication Sig   hydrochlorothiazide (HYDRODIURIL) 25 MG tablet Take 1 tablet (25 mg total) by mouth daily.   ACCU-CHEK AVIVA PLUS test strip SMARTSIG:Via Meter 6 Times Daily   Accu-Chek Softclix Lancets lancets 4 (four) times daily.   acetaminophen (TYLENOL) 500 MG tablet Take 1,000 mg by mouth every 6 (six) hours as needed. For pain   acyclovir (ZOVIRAX) 200 MG capsule Take 400 mg by mouth daily. Reported on 07/23/2015   albuterol (PROVENTIL HFA;VENTOLIN HFA) 108 (90 Base) MCG/ACT inhaler Inhale 2 puffs into the lungs every 4 (four) hours as needed for wheezing or shortness of breath.   atorvastatin (LIPITOR) 20 MG tablet Take 40 mg by mouth daily.   AUVELITY 45-105 MG TBCR Take by mouth in the morning and at bedtime.   Blood Glucose Monitoring Suppl (ACCU-CHEK GUIDE) w/Device KIT USE TO CHECK SUGAR   Continuous Blood Gluc Receiver (DEXCOM G6 RECEIVER) DEVI 1 Piece by Does not apply route as needed.   Continuous Blood Gluc Sensor (DEXCOM G6 SENSOR) MISC USE AS DIRECTED, CHANGE SENSOR EVERY 10 DAYS   Continuous Blood Gluc Transmit (DEXCOM G6 TRANSMITTER) MISC USE AS DIRECTED, CHANGE EVERY 90 DAYS   diclofenac Sodium (VOLTAREN) 1 % GEL Apply 1 application topically 3 (three) times daily.   gabapentin (NEURONTIN) 300 MG capsule Take 300-600 mg by mouth See admin instructions. Take 1 capsule (300 mg) by mouth in the morning & take 2 capsules (600 mg) by mouth at night   HUMALOG 100 UNIT/ML injection Inject 8-16 Units into the skin 3 (three) times daily with meals. Sliding Scale Insulin   LANTUS SOLOSTAR 100  UNIT/ML Solostar Pen Inject 26 Units into the skin at bedtime.   loratadine (CLARITIN) 10 MG tablet Take 10 mg by mouth daily.    losartan (COZAAR) 50 MG tablet TAKE 1 TABLET BY MOUTH EVERY DAY   mesalamine (CANASA) 1000 MG suppository Place 1 suppository (1,000 mg total) rectally at bedtime. Use every night for one month,  then every other night   Mesalamine 800 MG TBEC TAKE TWO (2) TABLETS BY MOUTH IN THE MORNING, AT NOON, AND AT BEDTIME   omeprazole (PRILOSEC) 20 MG capsule TAKE ONE CAPSULE BY MOUTH TWICE DAILY BEFORE A MEAL   ondansetron (ZOFRAN-ODT) 8 MG disintegrating tablet Take 8 mg by mouth every 8 (eight) hours as needed.   Facility-Administered Encounter Medications as of 02/14/2022  Medication   0.9 %  sodium chloride infusion   0.9 %  sodium chloride infusion    ALLERGIES: Allergies  Allergen Reactions   Ace Inhibitors Itching   Peanut Oil Itching   Shellfish Allergy Itching and Rash   Statins Itching   Adhesive [Tape] Rash   Pravastatin Sodium Itching   Pedi-Pre Tape Spray [Wound Dressing Adhesive]    Shellfish-Derived Products     VACCINATION STATUS: Immunization History  Administered Date(s) Administered   Influenza,inj,Quad PF,6+ Mos 10/22/2015   Influenza-Unspecified 10/16/2019   Pneumococcal Polysaccharide-23 01/03/2006   Td 10/04/2006   Tdap 07/10/1996    Diabetes She presents for her follow-up diabetic visit. She has type 1 diabetes mellitus. Onset time: She was diagnosed at approximate age of 23 years. Her disease course has been fluctuating. Pertinent negatives for hypoglycemia include no confusion, nervousness/anxiousness, pallor or seizures. Associated symptoms include fatigue, polydipsia and polyuria. Pertinent negatives for diabetes include no chest pain and no polyphagia. There are no hypoglycemic complications. Symptoms are worsening. There are no diabetic complications. Risk factors for coronary artery disease include diabetes mellitus, dyslipidemia,  obesity and sedentary lifestyle. Current diabetic treatment includes insulin injections. Her weight is increasing steadily. She is following a generally unhealthy diet. When asked about meal planning, she reported none. She has not had a previous visit with a dietitian. Her home blood glucose trend is fluctuating minimally. Her breakfast blood glucose range is generally >200 mg/dl. Her lunch blood glucose range is generally >200 mg/dl. Her dinner blood glucose range is generally 140-180 mg/dl. Her bedtime blood glucose range is generally >200 mg/dl. Her overall blood glucose range is >200 mg/dl. (Ms. Rammer presents with still significantly above target glycemic profile.  Her Dexcom CGM was analyzed.  She has 35% time in range, 26% level 1 hyperglycemia, 35% level 2 hyperglycemia.  She has no significant hypoglycemia.  Her point-of-care A1c is 8.1%.    ) An ACE inhibitor/angiotensin II receptor blocker is not being taken.  Hyperlipidemia This is a chronic problem. The current episode started more than 1 year ago. The problem is controlled. Pertinent negatives include no chest pain, myalgias or shortness of breath. Current antihyperlipidemic treatment includes statins (She was treated with atorvastatin in the past, did not continue on this medication complaining about cost.). Risk factors for coronary artery disease include diabetes mellitus, dyslipidemia, obesity and family history.    Review of systems  Constitutional: + Minimally fluctuating body weight,  current  Body mass index is 34.57 kg/m. , no fatigue, no subjective hyperthermia, no subjective hypothermia    Objective:    BP (!) 160/90 Comment: 148/92 L arm with manuel cuff  Pulse 64   Ht 5' 2"$  (1.575 m)   Wt 189 lb (85.7 kg)   LMP 09/04/2015 (Approximate)   BMI 34.57 kg/m   Wt Readings from Last 3 Encounters:  02/14/22 189 lb (85.7 kg)  12/13/21 192 lb 8 oz (87.3 kg)  10/12/21 191 lb 12.8 oz (87 kg)     Physical Exam-  Limited  Constitutional:  Body mass index is 34.57 kg/m. , not  in acute distress, normal state of mind   Recent Results (from the past 2160 hour(s))  HgB A1c     Status: Abnormal   Collection Time: 02/14/22 10:23 AM  Result Value Ref Range   Hemoglobin A1C     HbA1c POC (<> result, manual entry)     HbA1c, POC (prediabetic range)     HbA1c, POC (controlled diabetic range) 8.1 (A) 0.0 - 7.0 %     Diabetic Labs (most recent): Lab Results  Component Value Date   HGBA1C 8.1 (A) 02/14/2022   HGBA1C 8.30 09/07/2021   HGBA1C 8.5 (A) 06/22/2021   MICROALBUR 80 06/09/2020   MICROALBUR 2.7 12/02/2019   MICROALBUR 2.3 05/23/2018     Lipid Panel ( most recent) Lipid Panel     Component Value Date/Time   CHOL 199 09/07/2021 0000   TRIG 56 09/07/2021 0000   HDL 75 (A) 09/07/2021 0000   CHOLHDL 2.3 08/29/2019 0942   LDLCALC 113 09/07/2021 0000   LDLCALC 73 08/29/2019 0942      Latest Ref Rng & Units 09/07/2021   12:00 AM 07/07/2021    9:47 AM 01/20/2021   10:53 AM  CMP  Glucose 70 - 99 mg/dL  339  195   BUN 4 - 21 13     11  16   $ Creatinine 0.5 - 1.1 0.8     0.64  0.83   Sodium 137 - 147 141     139  141   Potassium 3.5 - 5.1 mEq/L 5.0     4.2  4.0   Chloride 99 - 108 104     103  104   CO2 13 - 22 33     28  27   Calcium 8.7 - 10.7 9.5     8.9  8.9   Total Protein 6.5 - 8.1 g/dL  6.6  6.7   Total Bilirubin 0.3 - 1.2 mg/dL  0.6  0.5   Alkaline Phos 25 - 125 95     96  104   AST 13 - 35 13     22  20   $ ALT 7 - 35 U/L 14     17  17      $ This result is from an external source.      Assessment & Plan:   1. Uncontrolled type 1 diabetes mellitus with hyperglycemia (HCC)  - Ninah S Izzard has currently uncontrolled symptomatic type 2 DM since 52 years of age.  Ms. Sanjurjo presents with still significantly above target glycemic profile.  Her Dexcom CGM was analyzed.  She has 35% time in range, 26% level 1 hyperglycemia, 35% level 2 hyperglycemia.  She has no significant  hypoglycemia.  Her point-of-care A1c is 8.1%.       Her recent anti-GAD antibody elevated at 61 suggesting type 1 diabetes.   -her diabetes is complicated by obesity/sedentary life and she remains at a high risk for more acute and chronic complications which include CAD, CVA, CKD, retinopathy, and neuropathy. These are all discussed in detail with her.  - I have counseled her on diet management and weight loss, by adopting a carbohydrate restricted/protein rich diet.  - she acknowledges that there is a room for improvement in her food and drink choices. - Suggestion is made for her to avoid simple carbohydrates  from her diet including Cakes, Sweet Desserts, Ice Cream, Soda (diet and regular), Sweet Tea, Candies, Chips, Cookies, Store Bought Juices, Alcohol in Excess of  1-2 drinks a day, Artificial Sweeteners,  Coffee Creamer, and "Sugar-free" Products, Lemonade. This will help patient to have more stable blood glucose profile and potentially avoid unintended weight gain.  - I encouraged her to switch to  unprocessed or minimally processed complex starch and increased protein intake (animal or plant source), fruits, and vegetables.  - she is advised to stick to a routine mealtimes to eat 3 meals  a day and avoid unnecessary snacks ( to snack only to correct hypoglycemia).    - I have approached her with the following individualized plan to manage diabetes and patient agrees:   -She will continue to need intensive treatment with basal/bolus insulin in order for her to achieve and maintain control of diabetes to target.     -Based on her presentation with hypoglycemia unawareness, fluctuating glycemic profile, she will continue to benefit from CGM.  She is advised to wear her Dexcom device all the time.   -She is advised to increase Lantus to 26 units nightly, continue Humalog 8-14  units for pre-meal blood glucose readings above 90 mg/day.  He is allowed to use tight scale  sliding scale  Humalog for  hyperglycemia above 150 mg per DL.  She is advised to continue monitoring blood glucose 4 times a day-before meals and at bedtime.  - she is warned not to take insulin without proper monitoring per orders. - Adjustment parameters are given to her for hypo and hyperglycemia in writing. - she is encouraged to call clinic for blood glucose levels less than 70 or above 200 mg /dl.  - she is not a suitable candidate  for metformin, SGLT2 inhibitors, nor incretin therapy.   - Patient specific target  A1c;  LDL, HDL, Triglycerides, were discussed in detail.  2) Blood Pressure /Hypertension:  -Her blood pressure is not controlled to target.  She has urine microalbuminuria.  She is currently  on losartan 50 mg daily.  I discussed and added hydrochlorothiazide 25 mg p.o. daily at breakfast.   3) Lipids/Hyperlipidemia: Review of her most recent lipid panel showed LDL worsening to 113 from 73.  She is advised to continue atorvastatin 20 mg p.o. nightly.   Side effects and precautions discussed with her.         4)  Weight/Diet: Her BMI is [id number]-  clearly complicating her diabetes care.  A candidate for modest weight loss, may avoid further weight gain by avoiding unnecessary snacks.  I discussed with her the fact that loss of 5 - 10% of her  current body weight will have the most impact on her diabetes management.   CDE Consult will be initiated . Exercise, and detailed carbohydrates information provided  -  detailed on discharge instructions.  5) Chronic Care/Health Maintenance:  -she  Is not on ACEI/ARB and Statin medications and  is encouraged to initiate and continue to follow up with Ophthalmology, Dentist,  Podiatrist at least yearly or according to recommendations, and advised to   stay away from smoking. I have recommended yearly flu vaccine and pneumonia vaccine at least every 5 years; moderate intensity exercise for up to 150 minutes weekly; and  sleep for at least 7 hours a  day.  - she is  advised to maintain close follow up with [name], MD for primary care needs, as well as her other providers for optimal and coordinated care.  I spent  26  minutes in the care of the patient today including review of labs from [place],  Lipids, Thyroid Function, Hematology (current and previous including abstractions from other facilities); face-to-face time discussing  her blood glucose readings/logs, discussing hypoglycemia and hyperglycemia episodes and symptoms, medications doses, her options of short and long term treatment based on the latest standards of care / guidelines;  discussion about incorporating lifestyle medicine;  and documenting the encounter. Risk reduction counseling performed per USPSTF guidelines to reduce  obesity and cardiovascular risk factors.     Please refer to Patient Instructions for Blood Glucose Monitoring and Insulin/Medications Dosing Guide"  in media tab for additional information. Please  also refer to " Patient Self Inventory" in the Media  tab for reviewed elements of pertinent patient history.  [name] participated in the discussions, expressed understanding, and voiced agreement with the above plans.  All questions were answered to her satisfaction. she is encouraged to contact clinic should she have any questions or concerns prior to her return visit.    Follow up plan: - Return in about 4 months (around 06/15/2022) for F/U with Pre-visit Labs, Meter/CGM/Logs, A1c here.  Glade Lloyd, MD [facility] [address] [place], [place] [address] Phone: [phone]  Fax: [phone]    02/14/2022, 11:40 AM  This note was partially dictated with voice recognition software. Similar sounding words can be transcribed inadequately or may not  be corrected upon review.

## 2022-02-21 ENCOUNTER — Encounter (INDEPENDENT_AMBULATORY_CARE_PROVIDER_SITE_OTHER): Payer: Self-pay | Admitting: Gastroenterology

## 2022-02-21 ENCOUNTER — Ambulatory Visit (INDEPENDENT_AMBULATORY_CARE_PROVIDER_SITE_OTHER): Payer: Medicaid Other | Admitting: Gastroenterology

## 2022-02-21 VITALS — BP 121/72 | HR 85 | Temp 97.5°F | Ht 62.0 in | Wt 187.0 lb

## 2022-02-21 DIAGNOSIS — K51311 Ulcerative (chronic) rectosigmoiditis with rectal bleeding: Secondary | ICD-10-CM

## 2022-02-21 NOTE — Progress Notes (Unsigned)
Brittney [name], M.D. Gastroenterology & Hepatology [facility] [address] [place], [place] [address]  Primary Care Physician: [name], MD [place] [address]  I will communicate my assessment and recommendations to the referring MD via EMR.  Problems: Ulcerative proctosigmoiditis   History of Present Illness: Brittney [name] is a 52 y.o. female with past medical history of ulcerative proctosigmoiditis, breast cancer, GERD, gastric disease, type 1 diabetes, who presents for follow up of ulcerative proctosigmoiditis.  The patient was last seen on 12/13/2021. At that time, the patient was advised to increase her Canasa every night and to continue the mesalamine 1.6 g 3 times daily.  She reports noticing a big difference when she switched the Canasa to every night. She is currently having 1-2 Bms per day. Stools are formed without melena or hematochezia. She is having some fecal urgency, usually at work when she is moving around. She has had 5 episodes of fecal soiling since she increased her Canasa. She reports that she has noticed some urge to have a bowel movement when she is having sexual intercourse, although she did not have a BM while having intercourse.  The patient denies having any nausea, vomiting, fever, chills, hematochezia, melena, hematemesis, abdominal distention, abdominal pain, diarrhea, jaundice, pruritus or weight loss. Has no rectal pain.  Last EGD: 12/05/2018 - Normal esophagus. - Z-line irregular, 40 cm from the incisors. - Portal hypertensive gastropathy. - A few gastric polyps. Biopsied. - Normal duodenal bulb and second portion of the duodenum. Biopsied.   Last Colonoscopy: 12/05/2018 - The examined portion of the ileum was normal. - The proximal sigmoid colon, mid sigmoid colon, descending colon, splenic flexure, transverse colon, hepatic flexure, ascending colon, cecum, appendiceal orifice and  ileocecal valve are normal. - Congested mucosa in the distal sigmoid colon. Biopsied. 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 Biopsied. - Anal papilla(e) were hypertrophied.   Path    A. DUODENUM, BIOPSY:  - Duodenal mucosa with no significant pathologic findings.  - Negative for increased intraepithelial lymphocytes and villous  architectural changes.   B. STOMACH, POLYPECTOMY:  - Fundic gland polyps.   C. COLON, BIOPSY:  - Mildly active chronic colitis.  - Negative for granulomata and dysplasia.   D. RECTUM, BIOPSY:  - Moderately active chronic colitis.  - Negative for granulomata and dysplasia.   Past Medical History: Past Medical History:  Diagnosis Date   Breast cancer ([place])    Breast cancer of upper-outer quadrant of right female breast ([place]) 06/11/2015   Triple negative, 2 cm    Chemotherapy induced neutropenia ([place]) 09/10/2015   Diabetes mellitus ([facility])    Type 1 over 15 yrs   Environmental allergies    GERD (gastroesophageal reflux disease)    Iron deficiency anemia due to chronic blood loss 06/12/2015   Personal history of chemotherapy    Personal history of radiation therapy    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) [facility])     Past Surgical History: Past Surgical History:  Procedure Laterality Date   BIOPSY  12/05/2018   Procedure: BIOPSY;  Surgeon: Rogene Houston, MD;  [facility];  Service: Endoscopy;;  duodenum gastric colon   BREAST LUMPECTOMY Right 2017   CESAREAN SECTION     COLONOSCOPY N/A 10/09/2013   Procedure: COLONOSCOPY;  Surgeon: Rogene Houston, MD;  [facility];  Service: Endoscopy;  Laterality: N/A;  100   COLONOSCOPY  N/A 12/05/2018   Procedure: COLONOSCOPY;  Surgeon: Rogene Houston, MD;  [facility];  Service: Endoscopy;  Laterality: N/A;   DILATION AND CURETTAGE OF UTERUS     x 2 for miscarriages   ESOPHAGOGASTRODUODENOSCOPY     ESOPHAGOGASTRODUODENOSCOPY N/A  12/05/2018   Procedure: ESOPHAGOGASTRODUODENOSCOPY (EGD);  Surgeon: Rogene Houston, MD;  [facility];  Service: Endoscopy;  Laterality: N/A;  200pm   FLEXIBLE SIGMOIDOSCOPY     FLEXIBLE SIGMOIDOSCOPY  07/27/2011   Procedure: FLEXIBLE SIGMOIDOSCOPY;  Surgeon: Rogene Houston, MD;  [facility];  Service: Endoscopy;  Laterality: N/A;  100    Family History: Family History  Problem Relation Age of Onset   Non-Hodgkin's lymphoma Mother    Hypertension Mother    Diabetes Mellitus II Mother    Hypertension Father    Diabetes Mellitus II Father    Breast cancer Cousin    Breast cancer Maternal Aunt    Colon cancer Maternal Uncle     Social History: Social History   Tobacco Use  Smoking Status Never   Passive exposure: Current  Smokeless Tobacco Never   Social History   Substance and Sexual Activity  Alcohol Use No   Alcohol/week: 0.0 standard drinks of alcohol   Social History   Substance and Sexual Activity  Drug Use No    Allergies: Allergies  Allergen Reactions   Ace Inhibitors Itching   Peanut Oil Itching   Shellfish Allergy Itching and Rash   Statins Itching   Adhesive [Tape] Rash   Pravastatin Sodium Itching   Pedi-Pre Tape Spray [Wound Dressing Adhesive]    Shellfish-Derived Products     Medications: Current Outpatient Medications  Medication Sig Dispense Refill   ACCU-CHEK AVIVA PLUS test strip SMARTSIG:Via Meter 6 Times Daily     Accu-Chek Softclix Lancets lancets 4 (four) times daily.     acetaminophen (TYLENOL) 500 MG tablet Take 1,000 mg by mouth every 6 (six) hours as needed. For pain     acyclovir (ZOVIRAX) 200 MG capsule Take 400 mg by mouth daily. Reported on 07/23/2015     albuterol (PROVENTIL HFA;VENTOLIN HFA) 108 (90 Base) MCG/ACT inhaler Inhale 2 puffs into the lungs every 4 (four) hours as needed for wheezing or shortness of breath.     atorvastatin (LIPITOR) 20 MG tablet Take 40 mg by mouth daily.     AUVELITY 45-105  MG TBCR Take by mouth in the morning and at bedtime.     Blood Glucose Monitoring Suppl (ACCU-CHEK GUIDE) w/Device KIT USE TO CHECK SUGAR     Continuous Blood Gluc Receiver (DEXCOM G6 RECEIVER) DEVI 1 Piece by Does not apply route as needed. 1 each 0   Continuous Blood Gluc Sensor (DEXCOM G6 SENSOR) MISC USE AS DIRECTED, CHANGE SENSOR EVERY 10 DAYS 3 each 2   Continuous Blood Gluc Transmit (DEXCOM G6 TRANSMITTER) MISC USE AS DIRECTED, CHANGE EVERY 90 DAYS 1 each 1   diclofenac Sodium (VOLTAREN) 1 % GEL Apply 1 application topically 3 (three) times daily.     gabapentin (NEURONTIN) 300 MG capsule Take 300-600 mg by mouth See admin instructions. Take 1 capsule (300 mg) by mouth in the morning & take 2 capsules (600 mg) by mouth at night  6   HUMALOG 100 UNIT/ML injection Inject 8-16 Units into the skin 3 (three) times daily with meals. Sliding Scale Insulin  6   hydrochlorothiazide (HYDRODIURIL) 25 MG tablet Take 1 tablet (25 mg total) by mouth daily.  90 tablet 1   INGREZZA 40 MG capsule Take 40 mg by mouth daily.     LANTUS SOLOSTAR 100 UNIT/ML Solostar Pen Inject 26 Units into the skin at bedtime.  6   loratadine (CLARITIN) 10 MG tablet Take 10 mg by mouth daily.   6   losartan (COZAAR) 50 MG tablet TAKE 1 TABLET BY MOUTH EVERY DAY 90 tablet 0   mesalamine (CANASA) 1000 MG suppository Place 1 suppository (1,000 mg total) rectally at bedtime. Use every night for one month, then every other night 30 suppository 1   Mesalamine 800 MG TBEC TAKE TWO (2) TABLETS BY MOUTH IN THE MORNING, AT NOON, AND AT BEDTIME 180 tablet 5   omeprazole (PRILOSEC) 20 MG capsule TAKE ONE CAPSULE BY MOUTH TWICE DAILY BEFORE A MEAL 180 capsule 3   ondansetron (ZOFRAN-ODT) 8 MG disintegrating tablet Take 8 mg by mouth every 8 (eight) hours as needed.     No current facility-administered medications for this visit.   Facility-Administered Medications Ordered in Other Visits  Medication Dose Route Frequency Provider Last  Rate Last Admin   0.9 %  sodium chloride infusion   Intravenous Continuous Kefalas, Manon Hilding, PA-C       0.9 %  sodium chloride infusion   Intravenous Continuous Higgs, Vetta, MD   Stopped at 05/26/17 1510    Review of Systems: GENERAL: negative for malaise, night sweats HEENT: No changes in hearing or vision, no nose bleeds or other nasal problems. NECK: Negative for lumps, goiter, pain and significant neck swelling RESPIRATORY: Negative for cough, wheezing CARDIOVASCULAR: Negative for chest pain, leg swelling, palpitations, orthopnea GI: SEE HPI MUSCULOSKELETAL: Negative for joint pain or swelling, back pain, and muscle pain. SKIN: Negative for lesions, rash PSYCH: Negative for sleep disturbance, mood disorder and recent psychosocial stressors. HEMATOLOGY Negative for prolonged bleeding, bruising easily, and swollen nodes. ENDOCRINE: Negative for cold or heat intolerance, polyuria, polydipsia and goiter. NEURO: negative for tremor, gait imbalance, syncope and seizures. The remainder of the review of systems is noncontributory.   Physical Exam: BP 121/72 (BP Location: Left Arm, Patient Position: Sitting, Cuff Size: Large)   Pulse 85   Temp (!) 97.5 F (36.4 C) (Temporal)   Ht 5' 2"$  (1.575 m)   Wt 187 lb (84.8 kg)   LMP 09/04/2015 (Approximate)   BMI 34.20 kg/m  GENERAL: The patient is AO x3, in no acute distress. HEENT: Head is normocephalic and atraumatic. EOMI are intact. Mouth is well hydrated and without lesions. NECK: Supple. No masses LUNGS: Clear to auscultation. No presence of rhonchi/wheezing/rales. Adequate chest expansion HEART: RRR, normal s1 and s2. ABDOMEN: Soft, nontender, no guarding, no peritoneal signs, and nondistended. BS +. No masses. EXTREMITIES: Without any cyanosis, clubbing, rash, lesions or edema. NEUROLOGIC: AOx3, no focal motor deficit. SKIN: no jaundice, no rashes  Imaging/Labs: as above  I personally reviewed and interpreted the available  labs, imaging and endoscopic files.  Impression and Plan: Brittney [name] is a 52 y.o. female with past medical history of ulcerative proctosigmoiditis, breast cancer, GERD, gastric disease, type 1 diabetes, who presents for follow up of ulcerative proctosigmoiditis.  The patient has presented improvement of her gastrointestinal symptoms after increasing the use of Canasa every night and continue her oral mesalamine.  However, she is still presenting some fecal urgency and occasional fecal soiling.  Of note, she also presents sexual intercourse discomfort, which is possibly a result of rectal irritation close to the vagina.  We discussed in detail  the importance of evaluating further her symptoms with a colonoscopy to determine if there is persistent disease activity despite taking maximal dose of mesalamine compounds.  She is agreeable to proceed with this.  We discussed that it is possible she may need to have advanced therapies for her ulcerative colitis if there is persistence of endoscopic inflammation.  She will think about the potential options in terms of mode of administration.  - Schedule colonoscopy - Continue mesalamine 1.6 g TID - Continue Canasa every night  All questions were answered.      Brittney Peppers, MD Gastroenterology and Hepatology [facility]

## 2022-02-21 NOTE — Patient Instructions (Addendum)
Schedule colonoscopy Continue mesalamine 1.6 g TID Continue Canasa every night Think which would be a better option for treatment of your disease - oral pill, IV infusion or SQ shot

## 2022-02-21 NOTE — H&P (View-Only) (Signed)
Brittney [name], M.D. Gastroenterology & Hepatology [facility] [address] [place], [place] [address]  Primary Care Physician: Ferdinando Lodge, Terry G, MD [address] [place] [address]  I will communicate my assessment and recommendations to the referring MD via EMR.  Problems: Ulcerative proctosigmoiditis   History of Present Illness: Brittney [name] is a 51 y.o. female with past medical history of ulcerative proctosigmoiditis, breast cancer, GERD, gastric disease, type 1 diabetes, who presents for follow up of ulcerative proctosigmoiditis.  The patient was last seen on 12/13/2021. At that time, the patient was advised to increase her Canasa every night and to continue the mesalamine 1.6 g 3 times daily.  She reports noticing a big difference when she switched the Canasa to every night. She is currently having 1-2 Bms per day. Stools are formed without melena or hematochezia. She is having some fecal urgency, usually at work when she is moving around. She has had 5 episodes of fecal soiling since she increased her Canasa. She reports that she has noticed some urge to have a bowel movement when she is having sexual intercourse, although she did not have a BM while having intercourse.  The patient denies having any nausea, vomiting, fever, chills, hematochezia, melena, hematemesis, abdominal distention, abdominal pain, diarrhea, jaundice, pruritus or weight loss. Has no rectal pain.  Last EGD: 12/05/2018 - Normal esophagus. - Z-line irregular, 40 cm from the incisors. - Portal hypertensive gastropathy. - A few gastric polyps. Biopsied. - Normal duodenal bulb and second portion of the duodenum. Biopsied.   Last Colonoscopy: 12/05/2018 - The examined portion of the ileum was normal. - The proximal sigmoid colon, mid sigmoid colon, descending colon, splenic flexure, transverse colon, hepatic flexure, ascending colon, cecum, appendiceal orifice and  ileocecal valve are normal. - Congested mucosa in the distal sigmoid colon. Biopsied. 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 Biopsied. - Anal papilla(e) were hypertrophied.   Path    A. DUODENUM, BIOPSY:  - Duodenal mucosa with no significant pathologic findings.  - Negative for increased intraepithelial lymphocytes and villous  architectural changes.   B. STOMACH, POLYPECTOMY:  - Fundic gland polyps.   C. COLON, BIOPSY:  - Mildly active chronic colitis.  - Negative for granulomata and dysplasia.   D. RECTUM, BIOPSY:  - Moderately active chronic colitis.  - Negative for granulomata and dysplasia.   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) ([facility])     Past Surgical History: Past Surgical History:  Procedure Laterality Date   BIOPSY  12/05/2018   Procedure: BIOPSY;  Surgeon: Rehman, Najeeb U, MD;  [facility];  Service: Endoscopy;;  duodenum gastric colon   BREAST LUMPECTOMY Right 2017   CESAREAN SECTION     COLONOSCOPY N/A 10/09/2013   Procedure: COLONOSCOPY;  Surgeon: Najeeb U Rehman, MD;  [facility];  Service: Endoscopy;  Laterality: N/A;  100   COLONOSCOPY   N/A 12/05/2018   Procedure: COLONOSCOPY;  Surgeon: Rehman, Najeeb U, MD;  [facility];  Service: Endoscopy;  Laterality: N/A;   DILATION AND CURETTAGE OF UTERUS     x 2 for miscarriages   ESOPHAGOGASTRODUODENOSCOPY     ESOPHAGOGASTRODUODENOSCOPY N/A  12/05/2018   Procedure: ESOPHAGOGASTRODUODENOSCOPY (EGD);  Surgeon: Rehman, Najeeb U, MD;  [facility];  Service: Endoscopy;  Laterality: N/A;  200pm   FLEXIBLE SIGMOIDOSCOPY     FLEXIBLE SIGMOIDOSCOPY  07/27/2011   Procedure: FLEXIBLE SIGMOIDOSCOPY;  Surgeon: Najeeb U Rehman, MD;  [facility];  Service: Endoscopy;  Laterality: N/A;  100    Family History: Family History  Problem Relation Age of Onset   Non-Hodgkin's lymphoma Mother    Hypertension Mother    Diabetes Mellitus II Mother    Hypertension Father    Diabetes Mellitus II Father    Breast cancer Cousin    Breast cancer Maternal Aunt    Colon cancer Maternal Uncle     Social History: Social History   Tobacco Use  Smoking Status Never   Passive exposure: Current  Smokeless Tobacco Never   Social History   Substance and Sexual Activity  Alcohol Use No   Alcohol/week: 0.0 standard drinks of alcohol   Social History   Substance and Sexual Activity  Drug Use No    Allergies: Allergies  Allergen Reactions   Ace Inhibitors Itching   Peanut Oil Itching   Shellfish Allergy Itching and Rash   Statins Itching   Adhesive [Tape] Rash   Pravastatin Sodium Itching   Pedi-Pre Tape Spray [Wound Dressing Adhesive]    Shellfish-Derived Products     Medications: Current Outpatient Medications  Medication Sig Dispense Refill   ACCU-CHEK AVIVA PLUS test strip SMARTSIG:Via Meter 6 Times Daily     Accu-Chek Softclix Lancets lancets 4 (four) times daily.     acetaminophen (TYLENOL) 500 MG tablet Take 1,000 mg by mouth every 6 (six) hours as needed. For pain     acyclovir (ZOVIRAX) 200 MG capsule Take 400 mg by mouth daily. Reported on 07/23/2015     albuterol (PROVENTIL HFA;VENTOLIN HFA) 108 (90 Base) MCG/ACT inhaler Inhale 2 puffs into the lungs every 4 (four) hours as needed for wheezing or shortness of breath.     atorvastatin (LIPITOR) 20 MG tablet Take 40 mg by mouth daily.     AUVELITY 45-105  MG TBCR Take by mouth in the morning and at bedtime.     Blood Glucose Monitoring Suppl (ACCU-CHEK GUIDE) w/Device KIT USE TO CHECK SUGAR     Continuous Blood Gluc Receiver (DEXCOM G6 RECEIVER) DEVI 1 Piece by Does not apply route as needed. 1 each 0   Continuous Blood Gluc Sensor (DEXCOM G6 SENSOR) MISC USE AS DIRECTED, CHANGE SENSOR EVERY 10 DAYS 3 each 2   Continuous Blood Gluc Transmit (DEXCOM G6 TRANSMITTER) MISC USE AS DIRECTED, CHANGE EVERY 90 DAYS 1 each 1   diclofenac Sodium (VOLTAREN) 1 % GEL Apply 1 application topically 3 (three) times daily.     gabapentin (NEURONTIN) 300 MG capsule Take 300-600 mg by mouth See admin instructions. Take 1 capsule (300 mg) by mouth in the morning & take 2 capsules (600 mg) by mouth at night  6   HUMALOG 100 UNIT/ML injection Inject 8-16 Units into the skin 3 (three) times daily with meals. Sliding Scale Insulin  6   hydrochlorothiazide (HYDRODIURIL) 25 MG tablet Take 1 tablet (25 mg total) by mouth daily.   90 tablet 1   INGREZZA 40 MG capsule Take 40 mg by mouth daily.     LANTUS SOLOSTAR 100 UNIT/ML Solostar Pen Inject 26 Units into the skin at bedtime.  6   loratadine (CLARITIN) 10 MG tablet Take 10 mg by mouth daily.   6   losartan (COZAAR) 50 MG tablet TAKE 1 TABLET BY MOUTH EVERY DAY 90 tablet 0   mesalamine (CANASA) 1000 MG suppository Place 1 suppository (1,000 mg total) rectally at bedtime. Use every night for one month, then every other night 30 suppository 1   Mesalamine 800 MG TBEC TAKE TWO (2) TABLETS BY MOUTH IN THE MORNING, AT NOON, AND AT BEDTIME 180 tablet 5   omeprazole (PRILOSEC) 20 MG capsule TAKE ONE CAPSULE BY MOUTH TWICE DAILY BEFORE A MEAL 180 capsule 3   ondansetron (ZOFRAN-ODT) 8 MG disintegrating tablet Take 8 mg by mouth every 8 (eight) hours as needed.     No current facility-administered medications for this visit.   Facility-Administered Medications Ordered in Other Visits  Medication Dose Route Frequency Provider Last  Rate Last Admin   0.9 %  sodium chloride infusion   Intravenous Continuous Kefalas, Thomas S, PA-C       0.9 %  sodium chloride infusion   Intravenous Continuous Higgs, Vetta, MD   Stopped at 05/26/17 1510    Review of Systems: GENERAL: negative for malaise, night sweats HEENT: No changes in hearing or vision, no nose bleeds or other nasal problems. NECK: Negative for lumps, goiter, pain and significant neck swelling RESPIRATORY: Negative for cough, wheezing CARDIOVASCULAR: Negative for chest pain, leg swelling, palpitations, orthopnea GI: SEE HPI MUSCULOSKELETAL: Negative for joint pain or swelling, back pain, and muscle pain. SKIN: Negative for lesions, rash PSYCH: Negative for sleep disturbance, mood disorder and recent psychosocial stressors. HEMATOLOGY Negative for prolonged bleeding, bruising easily, and swollen nodes. ENDOCRINE: Negative for cold or heat intolerance, polyuria, polydipsia and goiter. NEURO: negative for tremor, gait imbalance, syncope and seizures. The remainder of the review of systems is noncontributory.   Physical Exam: BP 121/72 (BP Location: Left Arm, Patient Position: Sitting, Cuff Size: Large)   Pulse 85   Temp (!) 97.5 F (36.4 C) (Temporal)   Ht 5' 2" (1.575 m)   Wt 187 lb (84.8 kg)   LMP 09/04/2015 (Approximate)   BMI 34.20 kg/m  GENERAL: The patient is AO x3, in no acute distress. HEENT: Head is normocephalic and atraumatic. EOMI are intact. Mouth is well hydrated and without lesions. NECK: Supple. No masses LUNGS: Clear to auscultation. No presence of rhonchi/wheezing/rales. Adequate chest expansion HEART: RRR, normal s1 and s2. ABDOMEN: Soft, nontender, no guarding, no peritoneal signs, and nondistended. BS +. No masses. EXTREMITIES: Without any cyanosis, clubbing, rash, lesions or edema. NEUROLOGIC: AOx3, no focal motor deficit. SKIN: no jaundice, no rashes  Imaging/Labs: as above  I personally reviewed and interpreted the available  labs, imaging and endoscopic files.  Impression and Plan: Brittney [name] is a 51 y.o. female with past medical history of ulcerative proctosigmoiditis, breast cancer, GERD, gastric disease, type 1 diabetes, who presents for follow up of ulcerative proctosigmoiditis.  The patient has presented improvement of her gastrointestinal symptoms after increasing the use of Canasa every night and continue her oral mesalamine.  However, she is still presenting some fecal urgency and occasional fecal soiling.  Of note, she also presents sexual intercourse discomfort, which is possibly a result of rectal irritation close to the vagina.  We discussed in detail   the importance of evaluating further her symptoms with a colonoscopy to determine if there is persistent disease activity despite taking maximal dose of mesalamine compounds.  She is agreeable to proceed with this.  We discussed that it is possible she may need to have advanced therapies for her ulcerative colitis if there is persistence of endoscopic inflammation.  She will think about the potential options in terms of mode of administration.  - Schedule colonoscopy - Continue mesalamine 1.6 g TID - Continue Canasa every night  All questions were answered.      Brittney Hulce Castaneda, MD Gastroenterology and Hepatology [facility]  

## 2022-02-22 ENCOUNTER — Encounter (INDEPENDENT_AMBULATORY_CARE_PROVIDER_SITE_OTHER): Payer: Self-pay | Admitting: *Deleted

## 2022-02-22 ENCOUNTER — Encounter (HOSPITAL_COMMUNITY): Payer: Self-pay | Admitting: Hematology

## 2022-02-22 ENCOUNTER — Telehealth (INDEPENDENT_AMBULATORY_CARE_PROVIDER_SITE_OTHER): Payer: Self-pay | Admitting: *Deleted

## 2022-02-22 ENCOUNTER — Other Ambulatory Visit (HOSPITAL_COMMUNITY): Payer: Self-pay

## 2022-02-22 ENCOUNTER — Telehealth: Payer: Self-pay | Admitting: Pharmacy Technician

## 2022-02-22 DIAGNOSIS — K51311 Ulcerative (chronic) rectosigmoiditis with rectal bleeding: Secondary | ICD-10-CM

## 2022-02-22 MED ORDER — CLENPIQ 10-3.5-12 MG-GM -GM/175ML PO SOLN: 1.0000 | ORAL | 0 refills | Status: DC

## 2022-02-22 NOTE — Telephone Encounter (Signed)
Pharmacy Patient Advocate Encounter   Received notification from Pt calls msgs that prior authorization for Dexcom is required/requested.    PA submitted on 02/22/22 to (ins)  [place] via [employer] (sensors), [facility] (transmitter) Status is pending

## 2022-02-22 NOTE — Telephone Encounter (Signed)
Spoke with pt. She has been scheduled for TCS with Dr. Jenetta Downer 3/13  at 830am. Aware will send rx for prep to pharmacy. She is aware needs labs done prior to procedure. Instructions mailed.

## 2022-03-04 ENCOUNTER — Other Ambulatory Visit (INDEPENDENT_AMBULATORY_CARE_PROVIDER_SITE_OTHER): Payer: Self-pay | Admitting: Internal Medicine

## 2022-03-04 DIAGNOSIS — K5130 Ulcerative (chronic) rectosigmoiditis without complications: Secondary | ICD-10-CM

## 2022-03-07 NOTE — Telephone Encounter (Signed)
Last seen 02/21/22

## 2022-03-10 ENCOUNTER — Telehealth (INDEPENDENT_AMBULATORY_CARE_PROVIDER_SITE_OTHER): Payer: Self-pay | Admitting: Gastroenterology

## 2022-03-10 ENCOUNTER — Other Ambulatory Visit (HOSPITAL_COMMUNITY)
Admission: RE | Admit: 2022-03-10 | Discharge: 2022-03-10 | Disposition: A | Discharge status: Home / Self Care | Payer: Medicaid Other | Source: Ambulatory Visit | Attending: Gastroenterology | Admitting: Gastroenterology

## 2022-03-10 DIAGNOSIS — K51311 Ulcerative (chronic) rectosigmoiditis with rectal bleeding: Secondary | ICD-10-CM | POA: Insufficient documentation

## 2022-03-10 LAB — BASIC METABOLIC PANEL
Anion gap: 8 (ref 5–15)
BUN: 9 mg/dL (ref 6–20)
CO2: 28 mmol/L (ref 22–32)
Calcium: 8.7 mg/dL — ABNORMAL LOW (ref 8.9–10.3)
Chloride: 100 mmol/L (ref 98–111)
Creatinine, Ser: 0.66 mg/dL (ref 0.44–1.00)
GFR, Estimated: >60 mL/min (ref >60)
Glucose, Bld: 304 mg/dL — ABNORMAL HIGH (ref 70–99)
Potassium: 3.9 mmol/L (ref 3.5–5.1)
Sodium: 136 mmol/L (ref 135–145)

## 2022-03-10 NOTE — Telephone Encounter (Signed)
Pt left voicemail stating that she forgot to have lab work done Monday for procedure next week. Pt wanting to know if it is to late to have done. Attempted to return pt call; voicemail not set up; unable to leave message.

## 2022-03-16 ENCOUNTER — Ambulatory Visit (HOSPITAL_COMMUNITY)
Admission: RE | Admit: 2022-03-16 | Discharge: 2022-03-16 | Disposition: A | Discharge status: Home / Self Care | Payer: Medicaid Other | Attending: Gastroenterology | Admitting: Gastroenterology

## 2022-03-16 ENCOUNTER — Other Ambulatory Visit: Payer: Self-pay

## 2022-03-16 ENCOUNTER — Encounter (INDEPENDENT_AMBULATORY_CARE_PROVIDER_SITE_OTHER): Payer: Self-pay | Admitting: *Deleted

## 2022-03-16 ENCOUNTER — Ambulatory Visit (HOSPITAL_COMMUNITY): Payer: Medicaid Other | Admitting: Anesthesiology

## 2022-03-16 ENCOUNTER — Encounter (HOSPITAL_COMMUNITY): Payer: Self-pay | Admitting: Gastroenterology

## 2022-03-16 ENCOUNTER — Ambulatory Visit (HOSPITAL_BASED_OUTPATIENT_CLINIC_OR_DEPARTMENT_OTHER): Payer: Medicaid Other | Admitting: Anesthesiology

## 2022-03-16 ENCOUNTER — Encounter (HOSPITAL_COMMUNITY)
Admission: RE | Disposition: A | Discharge status: Home / Self Care | Payer: Self-pay | Source: Home / Self Care | Attending: Gastroenterology

## 2022-03-16 DIAGNOSIS — K5130 Ulcerative (chronic) rectosigmoiditis without complications: Secondary | ICD-10-CM | POA: Insufficient documentation

## 2022-03-16 DIAGNOSIS — F32A Depression, unspecified: Secondary | ICD-10-CM | POA: Insufficient documentation

## 2022-03-16 DIAGNOSIS — K6289 Other specified diseases of anus and rectum: Secondary | ICD-10-CM | POA: Diagnosis not present

## 2022-03-16 DIAGNOSIS — Z8711 Personal history of peptic ulcer disease: Secondary | ICD-10-CM | POA: Diagnosis not present

## 2022-03-16 DIAGNOSIS — Z853 Personal history of malignant neoplasm of breast: Secondary | ICD-10-CM | POA: Insufficient documentation

## 2022-03-16 DIAGNOSIS — D649 Anemia, unspecified: Secondary | ICD-10-CM | POA: Diagnosis not present

## 2022-03-16 DIAGNOSIS — Z923 Personal history of irradiation: Secondary | ICD-10-CM | POA: Insufficient documentation

## 2022-03-16 DIAGNOSIS — Z794 Long term (current) use of insulin: Secondary | ICD-10-CM

## 2022-03-16 DIAGNOSIS — K219 Gastro-esophageal reflux disease without esophagitis: Secondary | ICD-10-CM | POA: Diagnosis not present

## 2022-03-16 DIAGNOSIS — E119 Type 2 diabetes mellitus without complications: Secondary | ICD-10-CM

## 2022-03-16 DIAGNOSIS — E109 Type 1 diabetes mellitus without complications: Secondary | ICD-10-CM | POA: Diagnosis not present

## 2022-03-16 DIAGNOSIS — Z9221 Personal history of antineoplastic chemotherapy: Secondary | ICD-10-CM | POA: Insufficient documentation

## 2022-03-16 HISTORY — PX: COLONOSCOPY WITH PROPOFOL: SHX5780

## 2022-03-16 HISTORY — PX: BIOPSY: SHX5522

## 2022-03-16 LAB — HM COLONOSCOPY

## 2022-03-16 LAB — GLUCOSE, CAPILLARY: Glucose-Capillary: 298 mg/dL — ABNORMAL HIGH (ref 70–99)

## 2022-03-16 SURGERY — COLONOSCOPY WITH PROPOFOL
Anesthesia: General

## 2022-03-16 MED ORDER — LACTATED RINGERS IV SOLN: INTRAVENOUS | Status: DC | PRN

## 2022-03-16 MED ORDER — LACTATED RINGERS IV SOLN
INTRAVENOUS | Status: DC
  Administered 2022-03-16: 1000 mL via INTRAVENOUS

## 2022-03-16 MED ORDER — PROPOFOL 500 MG/50ML IV EMUL
INTRAVENOUS | Status: DC | PRN
  Administered 2022-03-16: 150 ug/kg/min via INTRAVENOUS

## 2022-03-16 MED ORDER — LIDOCAINE HCL (CARDIAC) PF 100 MG/5ML IV SOSY
PREFILLED_SYRINGE | INTRAVENOUS | Status: DC | PRN
  Administered 2022-03-16: 60 mg via INTRATRACHEAL

## 2022-03-16 MED ORDER — HYDROCORTISONE 100 MG/60ML RE ENEM: 1.0000 | ENEMA | Freq: Every day | RECTAL | 0 refills | Status: DC

## 2022-03-16 MED ORDER — PROPOFOL 10 MG/ML IV BOLUS
INTRAVENOUS | Status: DC | PRN
  Administered 2022-03-16: 80 mg via INTRAVENOUS

## 2022-03-16 NOTE — Transfer of Care (Signed)
Immediate Anesthesia Transfer of Care Note  Patient: Brittney [name]  Procedure(s) Performed: COLONOSCOPY WITH PROPOFOL BIOPSY  Patient Location: Short Stay  Anesthesia Type:General  Level of Consciousness: sedated  Airway & Oxygen Therapy: Patient Spontanous Breathing  Post-op Assessment: Report given to RN and Post -op Vital signs reviewed and stable  Post vital signs: Reviewed and stable  Last Vitals:  Vitals Value Taken Time  BP 125/63 03/16/22 0916  Temp 36.4 C 03/16/22 0916  Pulse 66 03/16/22 0916  Resp 17 03/16/22 0916  SpO2 100 % 03/16/22 0916    Last Pain:  Vitals:   03/16/22 0916  TempSrc: Oral  PainSc: 0-No pain      Patients Stated Pain Goal: 5 ([id number])  Complications: No notable events documented.

## 2022-03-16 NOTE — Anesthesia Preprocedure Evaluation (Signed)
Anesthesia Evaluation  Patient identified by MRN, date of birth, ID band Patient awake    Reviewed: Allergy & Precautions, H&P , NPO status , Patient's Chart, lab work & pertinent test results, reviewed documented beta blocker date and time   Airway Mallampati: II  TM Distance: >3 FB Neck ROM: full    Dental no notable dental hx.    Pulmonary neg pulmonary ROS   Pulmonary exam normal breath sounds clear to auscultation       Cardiovascular Exercise Tolerance: Good negative cardio ROS  Rhythm:regular Rate:Normal     Neuro/Psych  Headaches PSYCHIATRIC DISORDERS  Depression    negative neurological ROS  negative psych ROS   GI/Hepatic negative GI ROS, Neg liver ROS, PUD,GERD  ,,  Endo/Other  negative endocrine ROSdiabetes, Type 1    Renal/GU negative Renal ROS  negative genitourinary   Musculoskeletal   Abdominal   Peds  Hematology negative hematology ROS (+) Blood dyscrasia, anemia   Anesthesia Other Findings   Reproductive/Obstetrics negative OB ROS                             Anesthesia Physical Anesthesia Plan  ASA: 3  Anesthesia Plan: General   Post-op Pain Management:    Induction:   PONV Risk Score and Plan: Propofol infusion  Airway Management Planned:   Additional Equipment:   Intra-op Plan:   Post-operative Plan:   Informed Consent: I have reviewed the patients History and Physical, chart, labs and discussed the procedure including the risks, benefits and alternatives for the proposed anesthesia with the patient or authorized representative who has indicated his/her understanding and acceptance.     Dental Advisory Given  Plan Discussed with: CRNA  Anesthesia Plan Comments:        Anesthesia Quick Evaluation

## 2022-03-16 NOTE — Telephone Encounter (Signed)
Pt had TCS completed today

## 2022-03-16 NOTE — Op Note (Addendum)
[facility] Patient Name: Brittney [name] Procedure Date: 03/16/2022 7:19 AM MRN: [id number] Date of Birth: 1970/01/30 Attending MD: [name] , , [id number] CSN: [id number] Age: 52 Admit Type: Outpatient Procedure:                Colonoscopy Indications:              Follow-up of chronic ulcerative proctosigmoiditis Providers:                [name], Caprice Kluver, Aram Candela Referring MD:              Medicines:                Monitored Anesthesia Care Complications:            No immediate complications. Estimated Blood Loss:     Estimated blood loss: none. Procedure:                Pre-Anesthesia Assessment:                           - Prior to the procedure, a History and Physical                            was performed, and patient medications, allergies                            and sensitivities were reviewed. The patient's                            tolerance of previous anesthesia was reviewed.                           - The risks and benefits of the procedure and the                            sedation options and risks were discussed with the                            patient. All questions were answered and informed                            consent was obtained.                           - ASA Grade Assessment: II - A patient with mild                            systemic disease.                           After obtaining informed consent, the colonoscope                            was passed under direct vision. Throughout the                            procedure, the patient's blood pressure,  pulse, and                            oxygen saturations were monitored continuously. The                            [id number]-[id number] scope was introduced through                            the anus and advanced to the the terminal ileum.                            The colonoscopy was performed without difficulty.                            The  patient tolerated the procedure well. The                            quality of the bowel preparation was adequate. Scope In: 8:43:37 AM Scope Out: 9:12:02 AM Scope Withdrawal Time: 0 hours 19 minutes 58 seconds  Total Procedure Duration: 0 hours 28 minutes 25 seconds  Findings:      The perianal and digital rectal examinations were normal.      The terminal ileum appeared normal.      Inflammation was found in a continuous and circumferential pattern from       the rectum to the rectosigmoid colon, around 15. This was graded as Mayo       Score 1 (mild, with erythema, decreased vascular pattern, mild       friability), and when compared to the previous examination, the findings       are improved. Biopsies were taken with a cold forceps for histology. The       rest of the colon was otherwise normal.      The retroflexed view of the distal rectum and anal verge was normal and       showed no anal or rectal abnormalities. Impression:               - The examined portion of the ileum was normal.                           - Mild (Mayo Score 1) ulcerative colitis, improved                            since the last examination. Biopsied.                           - The distal rectum and anal verge are normal on                            retroflexion view. Moderate Sedation:      Per Anesthesia Care Recommendation:           - Discharge patient to home (ambulatory).                           - Resume previous diet.                           -  Await pathology results.                           - Continue present medications. .                           - Start Cortenema every night for one month.                           - Follow up in GI clinic in 6 weeks.                           - Repeat colonoscopy in 10 years for screening                            purposes. 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 All rights reserved. The codes documented in this report are preliminary and upon coder review may  be revised to meet current compliance requirements. [name], MD [name],  03/16/2022 9:23:39 AM This report has been signed electronically. Number of Addenda: 0

## 2022-03-16 NOTE — Discharge Instructions (Addendum)
You are being discharged to home.  Resume your previous diet.  We are waiting for your pathology results.  Continue your present medications.  Start Cortenema every night for one month. Follow up in GI clinic in 6 weeks. Your physician has recommended a repeat colonoscopy in 10 years for screening purposes.

## 2022-03-16 NOTE — Interval H&P Note (Signed)
History and Physical Interval Note:  03/16/2022 8:33 AM  Brittney [name]  has presented today for surgery, with the diagnosis of ulcerative colitis.  The various methods of treatment have been discussed with the patient and family. After consideration of risks, benefits and other options for treatment, the patient has consented to  Procedure(s) with comments: COLONOSCOPY WITH PROPOFOL (N/A) - 830am, asa 1-2 as a surgical intervention.  The patient's history has been reviewed, patient examined, no change in status, stable for surgery.  I have reviewed the patient's chart and labs.  Questions were answered to the patient's satisfaction.     [name] Mayorga

## 2022-03-17 NOTE — Anesthesia Postprocedure Evaluation (Signed)
Anesthesia Post Note  Patient: Brittney [name]  Procedure(s) Performed: COLONOSCOPY WITH PROPOFOL BIOPSY  Patient location during evaluation: Phase II Anesthesia Type: General Level of consciousness: awake Pain management: pain level controlled Vital Signs Assessment: post-procedure vital signs reviewed and stable Respiratory status: spontaneous breathing and respiratory function stable Cardiovascular status: blood pressure returned to baseline and stable Postop Assessment: no headache and no apparent nausea or vomiting Anesthetic complications: no Comments: Late entry   No notable events documented.   Last Vitals:  Vitals:   03/16/22 0746 03/16/22 0916  BP: (!) 150/84 125/63  Pulse: 69 66  Resp: 15 17  Temp: 36.9 C 36.4 C  SpO2: 100% 100%    Last Pain:  Vitals:   03/16/22 0916  TempSrc: Oral  PainSc: 0-No pain                 Louann Sjogren

## 2022-03-18 ENCOUNTER — Encounter (INDEPENDENT_AMBULATORY_CARE_PROVIDER_SITE_OTHER): Payer: Self-pay

## 2022-03-18 ENCOUNTER — Telehealth (INDEPENDENT_AMBULATORY_CARE_PROVIDER_SITE_OTHER): Payer: Self-pay

## 2022-03-18 DIAGNOSIS — K625 Hemorrhage of anus and rectum: Secondary | ICD-10-CM | POA: Insufficient documentation

## 2022-03-18 LAB — SURGICAL PATHOLOGY

## 2022-03-18 NOTE — Telephone Encounter (Signed)
Hydrocortisone 100mg /60 ml Enemas  Approved today PA Case: [id number], Status: Approved, Coverage Starts on: 03/18/2022 12:00:00 AM, Coverage Ends on: 03/18/2023 12:00:00 AM.

## 2022-03-25 ENCOUNTER — Encounter (HOSPITAL_COMMUNITY): Payer: Self-pay | Admitting: Gastroenterology

## 2022-04-04 ENCOUNTER — Other Ambulatory Visit: Payer: Self-pay | Admitting: "Endocrinology

## 2022-04-07 ENCOUNTER — Other Ambulatory Visit (INDEPENDENT_AMBULATORY_CARE_PROVIDER_SITE_OTHER): Payer: Self-pay | Admitting: Internal Medicine

## 2022-04-13 ENCOUNTER — Other Ambulatory Visit: Payer: Self-pay | Admitting: "Endocrinology

## 2022-04-22 ENCOUNTER — Encounter (HOSPITAL_COMMUNITY): Payer: Self-pay | Admitting: Hematology

## 2022-04-26 ENCOUNTER — Other Ambulatory Visit (INDEPENDENT_AMBULATORY_CARE_PROVIDER_SITE_OTHER): Payer: Self-pay | Admitting: Gastroenterology

## 2022-04-26 DIAGNOSIS — K5130 Ulcerative (chronic) rectosigmoiditis without complications: Secondary | ICD-10-CM

## 2022-04-26 NOTE — Telephone Encounter (Signed)
Last seen 02/21/22 

## 2022-05-02 ENCOUNTER — Other Ambulatory Visit: Payer: Self-pay | Admitting: "Endocrinology

## 2022-05-03 ENCOUNTER — Ambulatory Visit (INDEPENDENT_AMBULATORY_CARE_PROVIDER_SITE_OTHER): Payer: Medicaid Other | Admitting: Gastroenterology

## 2022-05-16 ENCOUNTER — Other Ambulatory Visit: Payer: Self-pay | Admitting: "Endocrinology

## 2022-05-24 ENCOUNTER — Other Ambulatory Visit (INDEPENDENT_AMBULATORY_CARE_PROVIDER_SITE_OTHER): Payer: Self-pay | Admitting: Gastroenterology

## 2022-05-24 DIAGNOSIS — K5130 Ulcerative (chronic) rectosigmoiditis without complications: Secondary | ICD-10-CM

## 2022-05-24 DIAGNOSIS — K5190 Ulcerative colitis, unspecified, without complications: Secondary | ICD-10-CM

## 2022-05-24 DIAGNOSIS — K5120 Ulcerative (chronic) proctitis without complications: Secondary | ICD-10-CM

## 2022-05-24 DIAGNOSIS — K51919 Ulcerative colitis, unspecified with unspecified complications: Secondary | ICD-10-CM

## 2022-05-31 ENCOUNTER — Ambulatory Visit (INDEPENDENT_AMBULATORY_CARE_PROVIDER_SITE_OTHER): Payer: Medicaid Other | Admitting: Gastroenterology

## 2022-05-31 ENCOUNTER — Encounter (INDEPENDENT_AMBULATORY_CARE_PROVIDER_SITE_OTHER): Payer: Self-pay | Admitting: Gastroenterology

## 2022-05-31 VITALS — BP 112/74 | HR 83 | Temp 97.5°F | Ht 61.0 in | Wt 185.4 lb

## 2022-05-31 DIAGNOSIS — K219 Gastro-esophageal reflux disease without esophagitis: Secondary | ICD-10-CM | POA: Diagnosis not present

## 2022-05-31 DIAGNOSIS — R142 Eructation: Secondary | ICD-10-CM | POA: Diagnosis not present

## 2022-05-31 DIAGNOSIS — K51311 Ulcerative (chronic) rectosigmoiditis with rectal bleeding: Secondary | ICD-10-CM | POA: Diagnosis not present

## 2022-05-31 DIAGNOSIS — R140 Abdominal distension (gaseous): Secondary | ICD-10-CM

## 2022-05-31 MED ORDER — PANTOPRAZOLE SODIUM 40 MG PO TBEC: 40.0000 mg | DELAYED_RELEASE_TABLET | Freq: Every day | ORAL | 3 refills | Status: DC

## 2022-05-31 MED ORDER — DICYCLOMINE HCL 10 MG PO CAPS: 10.0000 mg | ORAL_CAPSULE | Freq: Three times a day (TID) | ORAL | 1 refills | Status: AC | PRN

## 2022-05-31 NOTE — Patient Instructions (Addendum)
Continue with mesalamine 3x/day  Continue with canasa 1g suppository at night  Start dicyclomine 10mg  for fecal urgency, can take up to three times per day as needed  Stop omeprazole, start Protonix 40mg  daily  Follow up 6 months

## 2022-05-31 NOTE — Progress Notes (Addendum)
Referring Provider: Richardean Chimera, MD Primary Care Physician:  Richardean Chimera, MD Primary GI Physician: Levon Hedger   Chief Complaint  Patient presents with   Follow-up    Patient here today for a follow up on her UC. Patient says she is still having fecal urgency. Denies any blood or mucus. Feels like she is constipated at times and has some occasional bloating. Patient is taking Mesalamine 800 mg two tablets po morning, noon and night. She also takes mesalamine(Canasa) suppository 1000 one rectally QHS.   HPI:   Brittney KODER is a 52 y.o. female with past medical history of ulcerative proctosigmoiditis, breast cancer, GERD, gastric disease, type 1 diabetes,   Patient presenting today for follow up of Ulcerative proctosigmoiditis   Last Seen February 2024, at that time noticing a big difference when she switched the Canasa to every night. having 1-2 Bms per day. Stools are formed without melena or hematochezia. having some fecal urgency, usually at work when she is moving around. She has had 5 episodes of fecal soiling since she increased her Canasa.  has noticed some urge to have a bowel movement when she is having sexual intercourse, although she did not have a BM while having intercourse.   Recommended to schedule Colonoscopy, continue mesalamine 1.6g TID, continue Canasa nightly.  Recent CBC and CMP March 2024 with albumin 3.2 Alk PHos 119 ,potassium 3.2 WBC 4.1   Present: Patient states she is feeling okay overall, though she continues with some fecal urgency at times. This continues to be occurring more when she is at work. Doesn't have as much issue at home. She notes her job is more active. She has occasional fecal soiling with this.  Having 1-2 stools per day. Stools are formed. Denies rectal bleeding or melena. Denies abdominal pain. She notes that fecal urgency has been ongoing for a while, sometimes occurs after eating. She denies constipation or stools that are hard to  pass.   She does note that she stopped drinking soda and is drinking more water, now having more belching, some bloating. She has occasional heartburn or acid regurgitation. Does not have to take anything in between. She has some occasional nausea. She is on omeprazole 20mg  BID, has been on this for some time. No early satiety, weight loss or post prandial abdominal pain.    Last EGD: 12/05/2018 - Normal esophagus. - Z-line irregular, 40 cm from the incisors. - Portal hypertensive gastropathy. - A few gastric polyps. Biopsied. - Normal duodenal bulb and second portion of the duodenum. Biopsied.  A. DUODENUM, BIOPSY:  - Duodenal mucosa with no significant pathologic findings.  - Negative for increased intraepithelial lymphocytes and villous  architectural changes.  B. STOMACH, POLYPECTOMY:  - Fundic gland polyps.  Last Colonoscopy: 03/2022 - The examined portion of the ileum was normal.                           - Mild (Mayo Score 1) ulcerative colitis, improved                            since the last examination. Biopsied.                           - The distal rectum and anal verge are normal on  retroflexion view. (Chronic inactive proctitis)  Repeat Colonoscopy 10 years   Past Medical History:  Diagnosis Date   Breast cancer (HCC)    Breast cancer of upper-outer quadrant of right female breast (HCC) 06/11/2015   Triple negative, 2 cm    Chemotherapy induced neutropenia (HCC) 09/10/2015   Diabetes mellitus (HCC)    Type 1 over 15 yrs   Environmental allergies    GERD (gastroesophageal reflux disease)    Iron deficiency anemia due to chronic blood loss 06/12/2015   Personal history of chemotherapy    Personal history of radiation therapy    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) [facility])     Past Surgical History:  Procedure Laterality Date   BIOPSY  12/05/2018   Procedure: BIOPSY;  Surgeon: Malissa Hippo, MD;  [facility];  Service:  Endoscopy;;  duodenum gastric colon   BIOPSY  03/16/2022   Procedure: BIOPSY;  Surgeon: Dolores Frame, MD;  Location: [facility];  Service: Gastroenterology;;   BREAST LUMPECTOMY Right 2017   CESAREAN SECTION  2007   COLONOSCOPY N/A 10/09/2013   Procedure: COLONOSCOPY;  Surgeon: Malissa Hippo, MD;  Location: [facility];  Service: Endoscopy;  Laterality: N/A;  100   COLONOSCOPY N/A 12/05/2018   Procedure: COLONOSCOPY;  Surgeon: Malissa Hippo, MD;  [facility]: [facility];  Service: Endoscopy;  Laterality: N/A;   COLONOSCOPY WITH PROPOFOL N/A 03/16/2022   Procedure: COLONOSCOPY WITH PROPOFOL;  Surgeon: Dolores Frame, MD;  [facility]: [facility];  Service: Gastroenterology;  Laterality: N/A;  830am, asa 1-2   DILATION AND CURETTAGE OF UTERUS     x 2 for miscarriages   ESOPHAGOGASTRODUODENOSCOPY     ESOPHAGOGASTRODUODENOSCOPY N/A 12/05/2018   Procedure: ESOPHAGOGASTRODUODENOSCOPY (EGD);  Surgeon: Malissa Hippo, MD;  [facility];  Service: Endoscopy;  Laterality: N/A;  200pm   FLEXIBLE SIGMOIDOSCOPY     FLEXIBLE SIGMOIDOSCOPY  07/27/2011   Procedure: FLEXIBLE SIGMOIDOSCOPY;  Surgeon: Malissa Hippo, MD;  [facility];  Service: Endoscopy;  Laterality: N/A;  100    Current Outpatient Medications  Medication Sig Dispense Refill   ACCU-CHEK AVIVA PLUS test strip SMARTSIG:Via Meter 6 Times Daily     Accu-Chek Softclix Lancets lancets 4 (four) times daily.     acetaminophen (TYLENOL) 500 MG tablet Take 1,000 mg by mouth every 6 (six) hours as needed. For pain     acyclovir (ZOVIRAX) 200 MG capsule Take 400 mg by mouth daily. Reported on 07/23/2015     albuterol (PROVENTIL HFA;VENTOLIN HFA) 108 (90 Base) MCG/ACT inhaler Inhale 2 puffs into the lungs every 4 (four) hours as needed for wheezing or shortness of breath.     atorvastatin (LIPITOR) 20 MG tablet Take 40 mg by mouth daily.     AUVELITY 45-105 MG TBCR Take by mouth in the morning  and at bedtime.     Blood Glucose Monitoring Suppl (ACCU-CHEK GUIDE) w/Device KIT USE TO CHECK SUGAR     Continuous Blood Gluc Receiver (DEXCOM G6 RECEIVER) DEVI 1 Piece by Does not apply route as needed. 1 each 0   Continuous Blood Gluc Transmit (DEXCOM G6 TRANSMITTER) MISC USE AS DIRECTED, CHANGE EVERY 90 DAYS 1 each 1   Continuous Glucose Sensor (DEXCOM G6 SENSOR) MISC USE AS DIRECTED, CHANGE SENSOR EVERY 10 DAYS 3 each 2   diclofenac Sodium (VOLTAREN) 1 % GEL Apply 1 application topically 3 (three) times daily.     gabapentin (NEURONTIN) 300  MG capsule Take 300-600 mg by mouth See admin instructions. Take 1 capsule (300 mg) by mouth in the morning & take 2 capsules (600 mg) by mouth at night  6   HUMALOG 100 UNIT/ML injection Inject 8-16 Units into the skin 3 (three) times daily with meals. Sliding Scale Insulin  6   hydrochlorothiazide (HYDRODIURIL) 25 MG tablet TAKE ONE TABLET BY MOUTH DAILY 90 tablet 0   hydrocortisone (CORTENEMA) 100 MG/60ML enema Place 1 enema (100 mg total) rectally at bedtime. 1800 mL 0   INGREZZA 40 MG capsule Take 40 mg by mouth daily.     LANTUS SOLOSTAR 100 UNIT/ML Solostar Pen Inject 26 Units into the skin at bedtime.  6   loratadine (CLARITIN) 10 MG tablet Take 10 mg by mouth daily.   6   losartan (COZAAR) 50 MG tablet TAKE 1 TABLET BY MOUTH EVERY DAY 90 tablet 0   mesalamine (CANASA) 1000 MG suppository Place 1 suppository (1,000 mg total) rectally at bedtime. 90 suppository 1   Mesalamine 800 MG TBEC TAKE 2 TABLETS BY MOUTH IN THE MORNING, AT NOON, AND AT BEDTIME 204 tablet 4   omeprazole (PRILOSEC) 20 MG capsule TAKE ONE CAPSULE BY MOUTH TWICE DAILY BEFORE A MEAL 180 capsule 3   ondansetron (ZOFRAN-ODT) 8 MG disintegrating tablet Take 8 mg by mouth every 8 (eight) hours as needed.     Sod Picosulfate-Mag Ox-Cit Acd (CLENPIQ) 10-3.5-12 MG-GM -GM/175ML SOLN Take 1 kit by mouth as directed. 350 mL 0   No current facility-administered medications for this visit.    Facility-Administered Medications Ordered in Other Visits  Medication Dose Route Frequency Provider Last Rate Last Admin   0.9 %  sodium chloride infusion   Intravenous Continuous Kefalas, Maurine Minister, PA-C       0.9 %  sodium chloride infusion   Intravenous Continuous Higgs, Minta Balsam, MD   Stopped at 05/26/17 1510    Allergies as of 05/31/2022 - Review Complete 03/16/2022  Allergen Reaction Noted   Ace inhibitors Itching 06/11/2015   Peanut oil Itching 10/09/2013   Shellfish allergy Itching and Rash 10/09/2013   Statins Itching 04/09/2013   Adhesive [tape] Rash 06/25/2015   Pravastatin sodium Itching 06/11/2015   Pedi-pre tape spray [wound dressing adhesive]  07/20/2020   Shellfish-derived products  07/20/2020    Family History  Problem Relation Age of Onset   Non-Hodgkin's lymphoma Mother    Hypertension Mother    Diabetes Mellitus II Mother    Hypertension Father    Diabetes Mellitus II Father    Breast cancer Cousin    Breast cancer Maternal Aunt    Colon cancer Maternal Uncle     Social History   Socioeconomic History   Marital status: Single    Spouse name: Not on file   Number of children: Not on file   Years of education: Not on file   Highest education level: Not on file  Occupational History   Not on file  Tobacco Use   Smoking status: Never    Passive exposure: Current   Smokeless tobacco: Never  Vaping Use   Vaping Use: Never used  Substance and Sexual Activity   Alcohol use: No    Alcohol/week: 0.0 standard drinks of alcohol   Drug use: No   Sexual activity: Not on file  Other Topics Concern   Not on file  Social History Narrative   Not on file   Social Determinants of Health   Financial Resource Strain: Low Risk  (  12/12/2019)   Overall Financial Resource Strain (CARDIA)    Difficulty of Paying Living Expenses: Not hard at all  Food Insecurity: No Food Insecurity (12/12/2019)   Hunger Vital Sign    Worried About Running Out of Food in the Last  Year: Never true    Ran Out of Food in the Last Year: Never true  Transportation Needs: No Transportation Needs (12/12/2019)   PRAPARE - Administrator, Civil Service (Medical): No    Lack of Transportation (Non-Medical): No  Physical Activity: Inactive (12/12/2019)   Exercise Vital Sign    Days of Exercise per Week: 0 days    Minutes of Exercise per Session: 0 min  Stress: No Stress Concern Present (12/12/2019)   [employer] of Occupational Health - Occupational Stress Questionnaire    Feeling of Stress : Not at all  Social Connections: Moderately Integrated (12/12/2019)   Social Connection and Isolation Panel [NHANES]    Frequency of Communication with Friends and Family: More than three times a week    Frequency of Social Gatherings with Friends and Family: Three times a week    Attends Religious Services: 1 to 4 times per year    Active Member of Clubs or Organizations: No    Attends Engineer, structural: 1 to 4 times per year    Marital Status: Never married    Review of systems General: negative for malaise, night sweats, fever, chills, weight loss Neck: Negative for lumps, goiter, pain and significant neck swelling Resp: Negative for cough, wheezing, dyspnea at rest CV: Negative for chest pain, leg swelling, palpitations, orthopnea GI: denies melena, hematochezia, nausea, vomiting, diarrhea, constipation, dysphagia, odyonophagia, early satiety or unintentional weight loss. +fecal urgency +belching +bloating  MSK: Negative for joint pain or swelling, back pain, and muscle pain. Derm: Negative for itching or rash Psych: Denies depression, anxiety, memory loss, confusion. No homicidal or suicidal ideation.  Heme: Negative for prolonged bleeding, bruising easily, and swollen nodes. Endocrine: Negative for cold or heat intolerance, polyuria, polydipsia and goiter. Neuro: negative for tremor, gait imbalance, syncope and seizures. The remainder of the review  of systems is noncontributory.  Physical Exam: BP 112/74 (BP Location: Left Arm, Patient Position: Sitting, Cuff Size: Large)   Pulse 83   Temp (!) 97.5 F (36.4 C) (Temporal)   Ht 5\' 1"  (1.549 m)   Wt 185 lb 6.4 oz (84.1 kg)   BMI 35.03 kg/m  General:   Alert and oriented. No distress noted. Pleasant and cooperative.  Head:  Normocephalic and atraumatic. Eyes:  Conjuctiva clear without scleral icterus. Mouth:  Oral mucosa pink and moist. Good dentition. No lesions. Heart: Normal rate and rhythm, s1 and s2 heart sounds present.  Lungs: Clear lung sounds in all lobes. Respirations equal and unlabored. Abdomen:  +BS, soft, non-tender and non-distended. No rebound or guarding. No HSM or masses noted. Derm: No palmar erythema or jaundice Msk:  Symmetrical without gross deformities. Normal posture. Extremities:  Without edema. Neurologic:  Alert and  oriented x4 Psych:  Alert and cooperative. Normal mood and affect.  Invalid input(s): "6 MONTHS"   ASSESSMENT: ALVERN [name] is a 52 y.o. female presenting today for follow up of UC.  Colonoscopy in march 2024 with endoscopic evidence of remission of her UC, maintained on Mesalamine 1.6g TID as well as Canasa suppository 1g QHS. Had recent CBC and CMP done in March. She has continued with some fecal urgency, since prior to her colonoscopy. Given UC is  in remission, Query if there is some underlying IBS as well. She notes fecal urgency more with activity and sometimes after eating. Will start dicyclomine 10mg  TID PRN to see if this provides relief.   GERD/Belching: on omeprazole 20mg  BID. Noting more belching/bloating recently. Has occasional heartburn/acid reflux, occasional nausea. No vomiting, early satiety or weight loss. Will try change in PPI therapy as she has been maintained on Omeprazole for some time and symptoms could be related to uncontrolled GERD. If she does not feel improvement with this, may need to consider further  evaluation such as H pylori testing.    PLAN:  Continue with mesalamine 1.6g TID  2. Continue with canasa 1g suppository QHS  3. Start dicyclomine 10mg  TID PRN  4. Stop omeprazole, start Protonix 40mg  daily 5. May consider H pylori testing if UGI symptoms not resolving with change in PPI therapy  All questions were answered, patient verbalized understanding and is in agreement with plan as outlined above.    Follow Up: 6 months   Gennie Dib L. Jeanmarie Hubert, MSN, APRN, AGNP-C Adult-Gerontology Nurse Practitioner [facility]  I have reviewed the note and agree with the APP's assessment as described in this progress note  If urgency or diarrhea worsens, may consider checking a fecal calprotectin in the future. If elevated may consider discussing stepping up to an advanced IBD treatment for proctosigmoiditis.  Katrinka Blazing, MD Gastroenterology and Hepatology [facility]

## 2022-06-01 DIAGNOSIS — R142 Eructation: Secondary | ICD-10-CM | POA: Insufficient documentation

## 2022-06-15 LAB — LIPID PANEL
Chol/HDL Ratio: 2.3 ratio (ref 0.0–4.4)
Cholesterol, Total: 156 mg/dL (ref 100–199)
HDL: 67 mg/dL (ref >39)
LDL Chol Calc (NIH): 77 mg/dL (ref 0–99)
Triglycerides: 61 mg/dL (ref 0–149)
VLDL Cholesterol Cal: 12 mg/dL (ref 5–40)

## 2022-06-15 LAB — COMPREHENSIVE METABOLIC PANEL
ALT: 14 IU/L (ref 0–32)
AST: 17 IU/L (ref 0–40)
Albumin/Globulin Ratio: 2.5
Albumin: 4.2 g/dL (ref 3.9–4.9)
Alkaline Phosphatase: 110 IU/L (ref 44–121)
BUN/Creatinine Ratio: 15 (ref 9–23)
BUN: 10 mg/dL (ref 6–24)
Bilirubin Total: 0.8 mg/dL (ref 0.0–1.2)
CO2: 27 mmol/L (ref 20–29)
Calcium: 9.3 mg/dL (ref 8.7–10.2)
Chloride: 102 mmol/L (ref 96–106)
Creatinine, Ser: 0.66 mg/dL (ref 0.57–1.00)
Globulin, Total: 1.7 g/dL (ref 1.5–4.5)
Glucose: 314 mg/dL — ABNORMAL HIGH (ref 70–99)
Potassium: 4.2 mmol/L (ref 3.5–5.2)
Sodium: 140 mmol/L (ref 134–144)
Total Protein: 5.9 g/dL — ABNORMAL LOW (ref 6.0–8.5)
eGFR: 107 mL/min/{1.73_m2} (ref >59)

## 2022-06-15 LAB — T4, FREE: Free T4: 1.02 ng/dL (ref 0.82–1.77)

## 2022-06-15 LAB — TSH: TSH: 0.835 u[IU]/mL (ref 0.450–4.500)

## 2022-06-21 ENCOUNTER — Ambulatory Visit: Payer: Medicaid Other | Admitting: "Endocrinology

## 2022-06-29 ENCOUNTER — Other Ambulatory Visit: Payer: Self-pay | Admitting: "Endocrinology

## 2022-07-18 ENCOUNTER — Ambulatory Visit (HOSPITAL_COMMUNITY): Payer: Medicaid Other

## 2022-07-18 ENCOUNTER — Other Ambulatory Visit (HOSPITAL_COMMUNITY): Payer: Self-pay | Admitting: Hematology

## 2022-07-18 ENCOUNTER — Other Ambulatory Visit (HOSPITAL_COMMUNITY): Payer: Self-pay | Admitting: Family Medicine

## 2022-07-18 ENCOUNTER — Inpatient Hospital Stay: Payer: Self-pay

## 2022-07-18 DIAGNOSIS — Z1231 Encounter for screening mammogram for malignant neoplasm of breast: Secondary | ICD-10-CM

## 2022-07-20 ENCOUNTER — Encounter: Payer: Self-pay | Admitting: "Endocrinology

## 2022-07-20 ENCOUNTER — Ambulatory Visit: Payer: Medicaid Other | Admitting: "Endocrinology

## 2022-07-20 VITALS — BP 102/66 | HR 80 | Ht 61.0 in | Wt 184.8 lb

## 2022-07-20 DIAGNOSIS — E6609 Other obesity due to excess calories: Secondary | ICD-10-CM

## 2022-07-20 DIAGNOSIS — Z794 Long term (current) use of insulin: Secondary | ICD-10-CM

## 2022-07-20 DIAGNOSIS — E1065 Type 1 diabetes mellitus with hyperglycemia: Secondary | ICD-10-CM | POA: Diagnosis not present

## 2022-07-20 DIAGNOSIS — Z6831 Body mass index (BMI) 31.0-31.9, adult: Secondary | ICD-10-CM

## 2022-07-20 DIAGNOSIS — E559 Vitamin D deficiency, unspecified: Secondary | ICD-10-CM | POA: Diagnosis not present

## 2022-07-20 DIAGNOSIS — E782 Mixed hyperlipidemia: Secondary | ICD-10-CM

## 2022-07-20 LAB — POCT GLYCOSYLATED HEMOGLOBIN (HGB A1C): HbA1c, POC (controlled diabetic range): 8.2 % — AB (ref 0.0–7.0)

## 2022-07-20 MED ORDER — DEXCOM G6 SENSOR MISC: 3 refills | Status: DC

## 2022-07-20 MED ORDER — INSULIN LISPRO (1 UNIT DIAL) 100 UNIT/ML (KWIKPEN): 8.0000 [IU] | PEN_INJECTOR | Freq: Three times a day (TID) | SUBCUTANEOUS | 2 refills | Status: DC

## 2022-07-20 MED ORDER — LANTUS SOLOSTAR 100 UNIT/ML ~~LOC~~ SOPN: 22.0000 [IU] | PEN_INJECTOR | Freq: Every day | SUBCUTANEOUS | 2 refills | Status: DC

## 2022-07-20 NOTE — Progress Notes (Signed)
07/20/2022, 10:59 AM     Endocrinology follow-up note  Subjective:    Patient ID: Brittney [name], female    DOB: [birth year].  Brittney [name] is being seen in follow-up in the management of currently uncontrolled symptomatic type 1 diabetes, hypertension, hyperlipidemia. PMD: Richardean Chimera, MD.   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) [facility])     Past Surgical History:  Procedure Laterality Date   BIOPSY  12/05/2018   Procedure: BIOPSY;  Surgeon: Malissa Hippo, MD;  [facility];  Service: Endoscopy;;  duodenum gastric colon   BIOPSY  03/16/2022   Procedure: BIOPSY;  Surgeon: Dolores Frame, MD;  [facility];  Service: Gastroenterology;;   BREAST LUMPECTOMY Right 2017   CESAREAN SECTION  2007   COLONOSCOPY N/A 10/09/2013   Procedure: COLONOSCOPY;  Surgeon: Malissa Hippo, MD;  [facility]: [facility];  Service: Endoscopy;  Laterality: N/A;  100   COLONOSCOPY N/A 12/05/2018   Procedure: COLONOSCOPY;  Surgeon: Malissa Hippo, MD;  [facility];  Service: Endoscopy;  Laterality: N/A;   COLONOSCOPY WITH PROPOFOL N/A 03/16/2022   Procedure: COLONOSCOPY WITH PROPOFOL;  Surgeon: Dolores Frame, MD;  [facility];  Service: Gastroenterology;  Laterality: N/A;  830am, asa 1-2   DILATION AND CURETTAGE OF UTERUS     x 2 for miscarriages    ESOPHAGOGASTRODUODENOSCOPY     ESOPHAGOGASTRODUODENOSCOPY N/A 12/05/2018   Procedure: ESOPHAGOGASTRODUODENOSCOPY (EGD);  Surgeon: Malissa Hippo, MD;  [facility];  Service: Endoscopy;  Laterality: N/A;  200pm   FLEXIBLE SIGMOIDOSCOPY     FLEXIBLE SIGMOIDOSCOPY  07/27/2011   Procedure: FLEXIBLE SIGMOIDOSCOPY;  Surgeon: Malissa Hippo, MD;  [facility];  Service: Endoscopy;  Laterality: N/A;  100    Social History   Socioeconomic History   Marital status: Single    Spouse name: Not on file   Number of children: Not on file   Years of education: Not on file   Highest education level: Not on file  Occupational History   Not on file  Tobacco Use   Smoking status: Never    Passive exposure: Current   Smokeless tobacco: Never  Vaping Use   Vaping status: Never Used  Substance and Sexual Activity   Alcohol use: No    Alcohol/week: 0.0 standard drinks of alcohol   Drug use: No   Sexual activity: Not on file  Other Topics Concern   Not on file  Social History Narrative   Not on file   Social  Determinants of Health   Financial Resource Strain: Low Risk  (12/12/2019)   Overall Financial Resource Strain (CARDIA)    Difficulty of Paying Living Expenses: Not hard at all  Food Insecurity: No Food Insecurity (12/12/2019)   Hunger Vital Sign    Worried About Running Out of Food in the Last Year: Never true    Ran Out of Food in the Last Year: Never true  Transportation Needs: No Transportation Needs (12/12/2019)   PRAPARE - Administrator, Civil Service (Medical): No    Lack of Transportation (Non-Medical): No  Physical Activity: Inactive (12/12/2019)   Exercise Vital Sign    Days of Exercise per Week: 0 days    Minutes of Exercise per Session: 0 min  Stress: No Stress Concern Present (12/12/2019)   [employer] of Occupational Health - Occupational Stress Questionnaire    Feeling of Stress : Not at all  Social Connections: Moderately  Integrated (12/12/2019)   Social Connection and Isolation Panel [NHANES]    Frequency of Communication with Friends and Family: More than three times a week    Frequency of Social Gatherings with Friends and Family: Three times a week    Attends Religious Services: 1 to 4 times per year    Active Member of Clubs or Organizations: No    Attends Engineer, structural: 1 to 4 times per year    Marital Status: Never married    Family History  Problem Relation Age of Onset   Non-Hodgkin's lymphoma Mother    Hypertension Mother    Diabetes Mellitus II Mother    Hypertension Father    Diabetes Mellitus II Father    Breast cancer Cousin    Breast cancer Maternal Aunt    Colon cancer Maternal Uncle     Outpatient Encounter Medications as of 07/20/2022  Medication Sig   insulin lispro (HUMALOG KWIKPEN) 100 UNIT/ML KwikPen Inject 8-11 Units into the skin 3 (three) times daily before meals.   ACCU-CHEK AVIVA PLUS test strip SMARTSIG:Via Meter 6 Times Daily   Accu-Chek Softclix Lancets lancets 4 (four) times daily.   acetaminophen (TYLENOL) 500 MG tablet Take 1,000 mg by mouth every 6 (six) hours as needed. For pain   acyclovir (ZOVIRAX) 200 MG capsule Take 400 mg by mouth daily. Reported on 07/23/2015   albuterol (PROVENTIL HFA;VENTOLIN HFA) 108 (90 Base) MCG/ACT inhaler Inhale 2 puffs into the lungs every 4 (four) hours as needed for wheezing or shortness of breath.   atorvastatin (LIPITOR) 20 MG tablet Take 40 mg by mouth daily.   AUVELITY 45-105 MG TBCR Take by mouth in the morning and at bedtime.   Blood Glucose Monitoring Suppl (ACCU-CHEK GUIDE) w/Device KIT USE TO CHECK SUGAR   Continuous Blood Gluc Receiver (DEXCOM G6 RECEIVER) DEVI 1 Piece by Does not apply route as needed.   Continuous Blood Gluc Transmit (DEXCOM G6 TRANSMITTER) MISC USE AS DIRECTED, CHANGE EVERY 90 DAYS   Continuous Glucose Sensor (DEXCOM G6 SENSOR) MISC Use to monitor glucose   dicyclomine (BENTYL) 10 MG  capsule Take 1 capsule (10 mg total) by mouth 3 (three) times daily as needed for spasms.   gabapentin (NEURONTIN) 300 MG capsule Take 300-600 mg by mouth See admin instructions. Take 1 capsule (300 mg) by mouth in the morning & take 2 capsules (600 mg) by mouth at night   hydrochlorothiazide (HYDRODIURIL) 25 MG tablet TAKE ONE TABLET BY MOUTH DAILY   INGREZZA 40 MG capsule Take 40 mg by  mouth daily.   lamoTRIgine (LAMICTAL) 100 MG tablet Take 100 mg by mouth at bedtime.   LANTUS SOLOSTAR 100 UNIT/ML Solostar Pen Inject 22 Units into the skin at bedtime.   loratadine (CLARITIN) 10 MG tablet Take 10 mg by mouth daily.    losartan (COZAAR) 50 MG tablet TAKE 1 TABLET BY MOUTH EVERY DAY   mesalamine (CANASA) 1000 MG suppository Place 1 suppository (1,000 mg total) rectally at bedtime.   Mesalamine 800 MG TBEC TAKE 2 TABLETS BY MOUTH IN THE MORNING, AT NOON, AND AT BEDTIME   ondansetron (ZOFRAN-ODT) 8 MG disintegrating tablet Take 8 mg by mouth every 8 (eight) hours as needed.   pantoprazole (PROTONIX) 40 MG tablet Take 1 tablet (40 mg total) by mouth daily.   [DISCONTINUED] Continuous Glucose Sensor (DEXCOM G6 SENSOR) MISC USE AS DIRECTED, CHANGE SENSOR EVERY 10 DAYS   [DISCONTINUED] HUMALOG 100 UNIT/ML injection Inject 8-11 Units into the skin 3 (three) times daily with meals. Sliding Scale Insulin   [DISCONTINUED] LANTUS SOLOSTAR 100 UNIT/ML Solostar Pen Inject 22 Units into the skin at bedtime.   Facility-Administered Encounter Medications as of 07/20/2022  Medication   0.9 %  sodium chloride infusion   0.9 %  sodium chloride infusion    ALLERGIES: Allergies  Allergen Reactions   Ace Inhibitors Itching   Peanut Oil Itching   Shellfish Allergy Itching and Rash   Statins Itching   Adhesive [Tape] Rash   Pravastatin Sodium Itching   Pedi-Pre Tape Spray [Wound Dressing Adhesive]    Shellfish-Derived Products     VACCINATION STATUS: Immunization History  Administered Date(s)  Administered   Influenza,inj,Quad PF,6+ Mos 10/22/2015   Influenza-Unspecified 10/16/2019   Pneumococcal Polysaccharide-23 01/03/2006   Td 10/04/2006   Tdap 07/10/1996    Diabetes She presents for her follow-up diabetic visit. She has type 1 diabetes mellitus. Onset time: She was diagnosed at approximate age of 17 years. Her disease course has been fluctuating. Pertinent negatives for hypoglycemia include no confusion, nervousness/anxiousness, pallor or seizures. Associated symptoms include fatigue, polydipsia and polyuria. Pertinent negatives for diabetes include no chest pain and no polyphagia. There are no hypoglycemic complications. Symptoms are worsening. There are no diabetic complications. Risk factors for coronary artery disease include diabetes mellitus, dyslipidemia, obesity and sedentary lifestyle. Current diabetic treatment includes insulin injections. Her weight is fluctuating minimally. She is following a generally unhealthy diet. When asked about meal planning, she reported none. She has not had a previous visit with a dietitian. Her home blood glucose trend is fluctuating dramatically. Her breakfast blood glucose range is generally >200 mg/dl. Her lunch blood glucose range is generally >200 mg/dl. Her dinner blood glucose range is generally >200 mg/dl. Her bedtime blood glucose range is generally >200 mg/dl. Her overall blood glucose range is >200 mg/dl. (Brittney [name] presents with still significantly fluctuating glycemic profile averaging 206 over the last 14 days.  Her Dexcom was analyzed showing 33% time in range, 58% of average.  She also has 9% hypoglycemia.  Her point-of-care A1c is 8.2% today.    ) An ACE inhibitor/angiotensin II receptor blocker is not being taken.  Hyperlipidemia This is a chronic problem. The current episode started more than 1 year ago. The problem is controlled. Pertinent negatives include no chest pain, myalgias or shortness of breath. Current  antihyperlipidemic treatment includes statins (She was treated with atorvastatin in the past, did not continue on this medication complaining about cost.). Risk factors for coronary artery disease include diabetes mellitus, dyslipidemia, obesity  and family history.    Review of systems  Constitutional: + Minimally fluctuating body weight,  current  Body mass index is 34.92 kg/m. , no fatigue, no subjective hyperthermia, no subjective hypothermia    Objective:    BP 102/66   Pulse 80   Ht 5\' 1"  (1.549 m)   Wt 184 lb 12.8 oz (83.8 kg)   BMI 34.92 kg/m   Wt Readings from Last 3 Encounters:  07/20/22 184 lb 12.8 oz (83.8 kg)  05/31/22 185 lb 6.4 oz (84.1 kg)  03/16/22 176 lb (79.8 kg)     Physical Exam- Limited  Constitutional:  Body mass index is 34.92 kg/m. , not in acute distress, normal state of mind   Recent Results (from the past 2160 hour(s))  Comprehensive metabolic panel     Status: Abnormal   Collection Time: 06/14/22 10:04 AM  Result Value Ref Range   Glucose 314 (H) 70 - 99 mg/dL   BUN 10 6 - 24 mg/dL   Creatinine, Ser 2.95 0.57 - 1.00 mg/dL   eGFR 621 >30 QM/VHQ/4.69   BUN/Creatinine Ratio 15 9 - 23   Sodium 140 134 - 144 mmol/L   Potassium 4.2 3.5 - 5.2 mmol/L   Chloride 102 96 - 106 mmol/L   CO2 27 20 - 29 mmol/L   Calcium 9.3 8.7 - 10.2 mg/dL   Total Protein 5.9 (L) 6.0 - 8.5 g/dL   Albumin 4.2 3.9 - 4.9 g/dL   Globulin, Total 1.7 1.5 - 4.5 g/dL   Albumin/Globulin Ratio 2.5    Bilirubin Total 0.8 0.0 - 1.2 mg/dL   Alkaline Phosphatase 110 44 - 121 IU/L   AST 17 0 - 40 IU/L   ALT 14 0 - 32 IU/L  Lipid panel     Status: None   Collection Time: 06/14/22 10:04 AM  Result Value Ref Range   Cholesterol, Total 156 100 - 199 mg/dL   Triglycerides 61 0 - 149 mg/dL   HDL 67 >62 mg/dL   VLDL Cholesterol Cal 12 5 - 40 mg/dL   LDL Chol Calc (NIH) 77 0 - 99 mg/dL   Chol/HDL Ratio 2.3 0.0 - 4.4 ratio    Comment:                                   T. Chol/HDL  Ratio                                             Men  Women                               1/2 Avg.Risk  3.4    3.3                                   Avg.Risk  5.0    4.4                                2X Avg.Risk  9.6    7.1  3X Avg.Risk 23.4   11.0   TSH     Status: None   Collection Time: 06/14/22 10:04 AM  Result Value Ref Range   TSH 0.835 0.450 - 4.500 uIU/mL  T4, free     Status: None   Collection Time: 06/14/22 10:04 AM  Result Value Ref Range   Free T4 1.02 0.82 - 1.77 ng/dL  HgB N5A     Status: Abnormal   Collection Time: 07/20/22 10:02 AM  Result Value Ref Range   Hemoglobin A1C     HbA1c POC (<> result, manual entry)     HbA1c, POC (prediabetic range)     HbA1c, POC (controlled diabetic range) 8.2 (A) 0.0 - 7.0 %     Diabetic Labs (most recent): Lab Results  Component Value Date   HGBA1C 8.2 (A) 07/20/2022   HGBA1C 8.1 (A) 02/14/2022   HGBA1C 8.30 09/07/2021   MICROALBUR 80 06/09/2020   MICROALBUR 2.7 12/02/2019   MICROALBUR 2.3 05/23/2018     Lipid Panel ( most recent) Lipid Panel     Component Value Date/Time   CHOL 156 06/14/2022 1004   TRIG 61 06/14/2022 1004   HDL 67 06/14/2022 1004   CHOLHDL 2.3 06/14/2022 1004   CHOLHDL 2.3 08/29/2019 0942   LDLCALC 77 06/14/2022 1004   LDLCALC 73 08/29/2019 0942      Latest Ref Rng & Units 06/14/2022   10:04 AM 03/10/2022   10:50 AM 09/07/2021   12:00 AM  CMP  Glucose 70 - 99 mg/dL 213  086    BUN 6 - 24 mg/dL 10  9  13       Creatinine 0.57 - 1.00 mg/dL 5.78  4.69  0.8      Sodium 134 - 144 mmol/L 140  136  141      Potassium 3.5 - 5.2 mmol/L 4.2  3.9  5.0      Chloride 96 - 106 mmol/L 102  100  104      CO2 20 - 29 mmol/L 27  28  33      Calcium 8.7 - 10.2 mg/dL 9.3  8.7  9.5      Total Protein 6.0 - 8.5 g/dL 5.9     Total Bilirubin 0.0 - 1.2 mg/dL 0.8     Alkaline Phos 44 - 121 IU/L 110   95      AST 0 - 40 IU/L 17   13      ALT 0 - 32 IU/L 14   14         This result  is from an external source.     Assessment & Plan:   1. Uncontrolled type 1 diabetes mellitus with hyperglycemia (HCC)  - Sarely S Clippinger has currently uncontrolled symptomatic type 2 DM since 52 years of age.  Brittney [name] presents with still significantly fluctuating glycemic profile averaging 206 over the last 14 days.  Her Dexcom was analyzed showing 33% time in range, 58% of average.  She also has 9% hypoglycemia.  Her point-of-care A1c is 8.2% today.       Her recent anti-GAD antibody elevated at 61 suggesting type 1 diabetes.   -her diabetes is complicated by obesity/sedentary life and she remains at a high risk for more acute and chronic complications which include CAD, CVA, CKD, retinopathy, and neuropathy. These are all discussed in detail with her.  - I have counseled her on diet management and weight loss, by adopting a carbohydrate restricted/protein rich  diet.   - she acknowledges that there is a room for improvement in her food and drink choices.  She still has problem following meal/insulin timing , at least on the weekends when she works.  - Suggestion is made for her to avoid simple carbohydrates  from her diet including Cakes, Sweet Desserts, Ice Cream, Soda (diet and regular), Sweet Tea, Candies, Chips, Cookies, Store Bought Juices, Alcohol in Excess of  1-2 drinks a day, Artificial Sweeteners,  Coffee Creamer, and "Sugar-free" Products, Lemonade. This will help patient to have more stable blood glucose profile and potentially avoid unintended weight gain. - I encouraged her to switch to  unprocessed or minimally processed complex starch and increased protein intake (animal or plant source), fruits, and vegetables.  - she is advised to stick to a routine mealtimes to eat 3 meals  a day and avoid unnecessary snacks ( to snack only to correct hypoglycemia).    - I have approached her with the following individualized plan to manage diabetes and patient agrees:   -She  will continue to need intensive treatment with basal/bolus insulin in order for her to achieve and maintain control of diabetes to target.     -Based on her presentation with hypoglycemia unawareness, fluctuating glycemic profile, she will continue to benefit from CGM.  She is advised to wear her Dexcom device all the time.   -She is advised to decrease Lantus to 22 units nightly, continue Humalog 8-11  units for pre-meal blood glucose readings above 90 mg/day.  He is allowed to use tight scale  sliding scale Humalog for  hyperglycemia above 150 mg per DL.  She is advised to continue monitoring blood glucose 4 times a day-before meals and at bedtime.  - she is warned not to take insulin without proper monitoring per orders. - Adjustment parameters are given to her for hypo and hyperglycemia in writing. - she is encouraged to call clinic for blood glucose levels less than 70 or above 200 mg /dl.  - she is not a suitable candidate  for metformin, SGLT2 inhibitors, nor incretin therapy.   - Patient specific target  A1c;  LDL, HDL, Triglycerides, were discussed in detail.  2) Blood Pressure /Hypertension:  -Her blood pressure is controlled to target.  She has urine microalbuminuria.  She is currently  on losartan 50 mg daily.  I discussed and added hydrochlorothiazide 25 mg p.o. daily at breakfast.   3) Lipids/Hyperlipidemia: Review of her most recent lipid panel showed LDL worsening to 113 from 73.  She is advised to continue atorvastatin 20 mg p.o. nightly.  Side effects and precautions discussed with her.         4)  Weight/Diet: Her BMI is 34.92-  clearly complicating her diabetes care.  A candidate for modest weight loss, may avoid further weight gain by avoiding unnecessary snacks.  I discussed with her the fact that loss of 5 - 10% of her  current body weight will have the most impact on her diabetes management.   CDE Consult will be initiated . Exercise, and detailed carbohydrates  information provided  -  detailed on discharge instructions.  5) Chronic Care/Health Maintenance:  -she  Is not on ACEI/ARB and Statin medications and  is encouraged to initiate and continue to follow up with Ophthalmology, Dentist,  Podiatrist at least yearly or according to recommendations, and advised to   stay away from smoking. I have recommended yearly flu vaccine and pneumonia vaccine at least every 5  years; moderate intensity exercise for up to 150 minutes weekly; and  sleep for at least 7 hours a day.  - she is  advised to maintain close follow up with Richardean Chimera, MD for primary care needs, as well as her other providers for optimal and coordinated care.    I spent  26  minutes in the care of the patient today including review of labs from CMP, Lipids, Thyroid Function, Hematology (current and previous including abstractions from other facilities); face-to-face time discussing  her blood glucose readings/logs, discussing hypoglycemia and hyperglycemia episodes and symptoms, medications doses, her options of short and long term treatment based on the latest standards of care / guidelines;  discussion about incorporating lifestyle medicine;  and documenting the encounter. Risk reduction counseling performed per USPSTF guidelines to reduce  obesity and cardiovascular risk factors.     Please refer to Patient Instructions for Blood Glucose Monitoring and Insulin/Medications Dosing Guide"  in media tab for additional information. Please  also refer to " Patient Self Inventory" in the Media  tab for reviewed elements of pertinent patient history.  Rosalene Billings participated in the discussions, expressed understanding, and voiced agreement with the above plans.  All questions were answered to her satisfaction. she is encouraged to contact clinic should she have any questions or concerns prior to her return visit.   Follow up plan: - Return in about 4 months (around 11/20/2022) for Bring  Meter/CGM Device/Logs- A1c in Office.  Marquis Lunch, MD [facility] [address] [place], [place] [address] Phone: [phone]  Fax: [phone]    07/20/2022, 10:59 AM  This note was partially dictated with voice recognition software. Similar sounding words can be transcribed inadequately or may not  be corrected upon review.

## 2022-07-20 NOTE — Patient Instructions (Signed)

## 2022-07-25 ENCOUNTER — Inpatient Hospital Stay: Payer: Self-pay | Admitting: Physician Assistant

## 2022-07-27 ENCOUNTER — Ambulatory Visit (HOSPITAL_COMMUNITY)
Admission: RE | Admit: 2022-07-27 | Discharge: 2022-07-27 | Disposition: A | Discharge status: Home / Self Care | Payer: Medicaid Other | Source: Ambulatory Visit | Attending: Hematology | Admitting: Hematology

## 2022-07-27 DIAGNOSIS — Z1231 Encounter for screening mammogram for malignant neoplasm of breast: Secondary | ICD-10-CM | POA: Insufficient documentation

## 2022-08-01 ENCOUNTER — Inpatient Hospital Stay: Payer: Medicaid Other | Attending: Hematology

## 2022-08-01 DIAGNOSIS — R079 Chest pain, unspecified: Secondary | ICD-10-CM | POA: Diagnosis not present

## 2022-08-01 DIAGNOSIS — E538 Deficiency of other specified B group vitamins: Secondary | ICD-10-CM | POA: Insufficient documentation

## 2022-08-01 DIAGNOSIS — K5190 Ulcerative colitis, unspecified, without complications: Secondary | ICD-10-CM | POA: Diagnosis not present

## 2022-08-01 DIAGNOSIS — Z853 Personal history of malignant neoplasm of breast: Secondary | ICD-10-CM | POA: Insufficient documentation

## 2022-08-01 DIAGNOSIS — Z803 Family history of malignant neoplasm of breast: Secondary | ICD-10-CM | POA: Insufficient documentation

## 2022-08-01 DIAGNOSIS — E611 Iron deficiency: Secondary | ICD-10-CM | POA: Insufficient documentation

## 2022-08-01 DIAGNOSIS — Z923 Personal history of irradiation: Secondary | ICD-10-CM | POA: Insufficient documentation

## 2022-08-01 DIAGNOSIS — Z800 Family history of malignant neoplasm of digestive organs: Secondary | ICD-10-CM | POA: Insufficient documentation

## 2022-08-01 DIAGNOSIS — D500 Iron deficiency anemia secondary to blood loss (chronic): Secondary | ICD-10-CM

## 2022-08-01 DIAGNOSIS — Z807 Family history of other malignant neoplasms of lymphoid, hematopoietic and related tissues: Secondary | ICD-10-CM | POA: Diagnosis not present

## 2022-08-01 DIAGNOSIS — C50411 Malignant neoplasm of upper-outer quadrant of right female breast: Secondary | ICD-10-CM

## 2022-08-01 DIAGNOSIS — Z9221 Personal history of antineoplastic chemotherapy: Secondary | ICD-10-CM | POA: Diagnosis not present

## 2022-08-01 LAB — VITAMIN D 25 HYDROXY (VIT D DEFICIENCY, FRACTURES): Vit D, 25-Hydroxy: 46.28 ng/mL (ref 30–100)

## 2022-08-01 LAB — COMPREHENSIVE METABOLIC PANEL
ALT: 18 U/L (ref 0–44)
AST: 18 U/L (ref 15–41)
Albumin: 3.7 g/dL (ref 3.5–5.0)
Alkaline Phosphatase: 93 U/L (ref 38–126)
Anion gap: 4 — ABNORMAL LOW (ref 5–15)
BUN: 12 mg/dL (ref 6–20)
CO2: 31 mmol/L (ref 22–32)
Calcium: 8.7 mg/dL — ABNORMAL LOW (ref 8.9–10.3)
Chloride: 100 mmol/L (ref 98–111)
Creatinine, Ser: 0.74 mg/dL (ref 0.44–1.00)
GFR, Estimated: >60 mL/min (ref >60)
Glucose, Bld: 437 mg/dL — ABNORMAL HIGH (ref 70–99)
Potassium: 4.1 mmol/L (ref 3.5–5.1)
Sodium: 135 mmol/L (ref 135–145)
Total Bilirubin: 0.9 mg/dL (ref 0.3–1.2)
Total Protein: 6.4 g/dL — ABNORMAL LOW (ref 6.5–8.1)

## 2022-08-01 LAB — VITAMIN B12: Vitamin B-12: 387 pg/mL (ref 180–914)

## 2022-08-01 LAB — IRON AND TIBC
Iron: 64 ug/dL (ref 28–170)
Saturation Ratios: 19 % (ref 10.4–31.8)
TIBC: 336 ug/dL (ref 250–450)
UIBC: 272 ug/dL

## 2022-08-01 LAB — CBC WITH DIFFERENTIAL/PLATELET
Abs Immature Granulocytes: 0.02 10*3/uL (ref 0.00–0.07)
Basophils Absolute: 0 10*3/uL (ref 0.0–0.1)
Basophils Relative: 0 %
Eosinophils Absolute: 0 10*3/uL (ref 0.0–0.5)
Eosinophils Relative: 0 %
HCT: 37.8 % (ref 36.0–46.0)
Hemoglobin: 11.9 g/dL — ABNORMAL LOW (ref 12.0–15.0)
Immature Granulocytes: 1 %
Lymphocytes Relative: 29 %
Lymphs Abs: 1.1 10*3/uL (ref 0.7–4.0)
MCH: 27.7 pg (ref 26.0–34.0)
MCHC: 31.5 g/dL (ref 30.0–36.0)
MCV: 87.9 fL (ref 80.0–100.0)
Monocytes Absolute: 0.3 10*3/uL (ref 0.1–1.0)
Monocytes Relative: 7 %
Neutro Abs: 2.4 10*3/uL (ref 1.7–7.7)
Neutrophils Relative %: 63 %
Platelets: 164 10*3/uL (ref 150–400)
RBC: 4.3 MIL/uL (ref 3.87–5.11)
RDW: 14.2 % (ref 11.5–15.5)
WBC: 3.8 10*3/uL — ABNORMAL LOW (ref 4.0–10.5)
nRBC: 0 % (ref 0.0–0.2)

## 2022-08-01 LAB — FERRITIN: Ferritin: 119 ng/mL (ref 11–307)

## 2022-08-01 NOTE — Progress Notes (Unsigned)
[facility] [address], [place] [address]   CLINIC:  Medical Oncology/Hematology  PCP:  Richardean Chimera, MD [address] [place] / [place] [address] [phone]    *** RESUME PREP BELOW ***     REASON FOR VISIT:  Follow-up for ***  PRIOR THERAPY: ***  NGS Results: ***  CURRENT THERAPY: ***  BRIEF ONCOLOGIC HISTORY:   Oncology History  Malignant neoplasm of upper-outer quadrant of right female breast (HCC)  04/29/2015 Initial Biopsy   upper outer quadrant mass R breast 2.9 cm by ultrasound, high grade invasive ductal carcinoma ER< 1%, PR 1%, HER -2 not amplified, Ki-67 66%   05/13/2015 Cancer Staging   pT1cN0   05/13/2015 Surgery   lumpectomy and sentinel node biopsies X 3, 2 cm tumor, 3 negative nodes    05/21/2015 Imaging   CT [facility] with postsurgical changes related to R breast lympectomy and R axillary LN dissection, no findings suspicious for metastatic disease   06/12/2015 Procedure   Port placement by Dr. Gabriel Cirri   06/17/2015 Imaging   MUGA- Normal left ventricular ejection fraction equal to 62.9%.   06/25/2015 - 08/13/2015 Chemotherapy   AC x 4   08/27/2015 - 09/03/2015 Chemotherapy   Weekly Taxol x 2 cycles    09/11/2015 - 09/15/2015 Hospital Admission   Neutropenic fever, ARF secondary to antibiotics   10/01/2015 - 11/12/2015 Chemotherapy   Taxotere 75 mg/m2 Q3 weeks x 3 cycles     10/01/2015 Treatment Plan Change   Taxol changed to Taxotere given hospitalization.     - 01/14/2016 Radiation Therapy   Radiation in [facility]   05/11/2016 Mammogram   IMPRESSION: No evidence of malignancy within either breast. Expected postsurgical changes within the right breast.      CANCER STAGING: Cancer Staging  Malignant neoplasm of upper-outer quadrant of right female breast [facility]) Staging form: Breast, AJCC 7th Edition - Clinical stage from 05/13/2015: Stage IA (T1c, N0, M0) - Signed by Ellouise Newer, PA-C on  06/25/2015   INTERVAL HISTORY:   Brittney [name], a 52 y.o. female, returns for routine follow-up of her ***. Brittney [name] was last seen on {XX/XX/XXXX}.   At today's visit, she  reports feeling ***.  She denies any recent hospitalizations, surgeries, or changes in her  baseline health status.  Most recent mammogram on *** showed ***.  She denies any symptoms of recurrence such as new lumps, bone pain, chest pain, dyspnea, or abdominal pain.  She has no new headaches, seizures, or focal neurologic deficits.  No B symptoms such as fever, chills, night sweats, unintentional weight loss.  She continues to take *** ARIMIDEX:  Hot flashes, mood swings, weight gain, alopecia, and musculoskeletal pain *** TAMOXIFEN: Hot flashes, night sweats, abnormal vaginal bleeding/vaginal discharge, DVT/PE  She reports ***% energy and ***% appetite.  She is maintaining stable weight at this time.   ASSESSMENT & PLAN:  1.  *** - *** - PLAN: ***   2.  *** - *** - PLAN: ***   3.  *** - *** - PLAN: ***   4.  *** - *** - PLAN: ***   PLAN SUMMARY: >> *** >> *** >> ***   REVIEW OF SYSTEMS: ***  Review of Systems - Oncology  PHYSICAL EXAM:   Performance status (ECOG): {CHL ONC UX:[id number]} *** There were no vitals filed for this visit. Wt Readings from Last 3 Encounters:  07/20/22 184 lb 12.8 oz (83.8 kg)  05/31/22 185 lb 6.4 oz (84.1 kg)  03/16/22 176 lb (79.8 kg)   Physical Exam   PAST MEDICAL/SURGICAL HISTORY:  Past Medical History:  Diagnosis Date   Breast cancer (HCC)    Breast cancer of upper-outer quadrant of right female breast (HCC) 06/11/2015   Triple negative, 2 cm    Chemotherapy induced neutropenia (HCC) 09/10/2015   Diabetes mellitus (HCC)    Type 1 over 15 yrs   Environmental allergies    GERD (gastroesophageal reflux disease)    Iron deficiency anemia due to chronic blood loss 06/12/2015   Personal history of chemotherapy    Personal history of radiation therapy     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) [facility])    Past Surgical History:  Procedure Laterality Date   BIOPSY  12/05/2018   Procedure: BIOPSY;  Surgeon: Malissa Hippo, MD;  [facility];  Service: Endoscopy;;  duodenum gastric colon   BIOPSY  03/16/2022   Procedure: BIOPSY;  Surgeon: Dolores Frame, MD;  [facility];  Service: Gastroenterology;;   BREAST LUMPECTOMY Right 2017   CESAREAN SECTION  2007   COLONOSCOPY N/A 10/09/2013   Procedure: COLONOSCOPY;  Surgeon: Malissa Hippo, MD;  [facility];  Service: Endoscopy;  Laterality: N/A;  100   COLONOSCOPY N/A 12/05/2018   Procedure: COLONOSCOPY;  Surgeon: Malissa Hippo, MD;  [facility];  Service: Endoscopy;  Laterality: N/A;   COLONOSCOPY WITH PROPOFOL N/A 03/16/2022   Procedure: COLONOSCOPY WITH PROPOFOL;  Surgeon: Dolores Frame, MD;  [facility];  Service: Gastroenterology;  Laterality: N/A;  830am, asa 1-2   DILATION AND CURETTAGE OF UTERUS     x 2 for miscarriages   ESOPHAGOGASTRODUODENOSCOPY     ESOPHAGOGASTRODUODENOSCOPY N/A 12/05/2018   Procedure: ESOPHAGOGASTRODUODENOSCOPY (EGD);  Surgeon: Malissa Hippo, MD;  [facility];  Service: Endoscopy;  Laterality: N/A;  200pm   FLEXIBLE SIGMOIDOSCOPY     FLEXIBLE SIGMOIDOSCOPY  07/27/2011   Procedure: FLEXIBLE SIGMOIDOSCOPY;  Surgeon: Malissa Hippo, MD;  [facility];  Service: Endoscopy;  Laterality: N/A;  100    SOCIAL HISTORY:  Social History   Socioeconomic History   Marital status: Single    Spouse name: Not on file   Number of children: Not on file   Years of education: Not on file   Highest education level: Not on file  Occupational History   Not on file  Tobacco Use   Smoking status: Never    Passive exposure: Current   Smokeless tobacco: Never  Vaping Use   Vaping status: Never Used  Substance and Sexual Activity   Alcohol use: No     Alcohol/week: 0.0 standard drinks of alcohol   Drug use: No   Sexual activity: Not on file  Other Topics Concern   Not on file  Social History Narrative   Not on file   Social Determinants of Health   Financial Resource Strain: Low Risk  (12/12/2019)   Overall Financial Resource Strain (CARDIA)    Difficulty of Paying Living Expenses: Not hard at all  Food Insecurity: No Food Insecurity (12/12/2019)   Hunger Vital Sign    Worried About Running Out of Food in the Last Year: Never true    Ran Out of Food in the Last Year: Never true  Transportation Needs: No Transportation Needs (12/12/2019)   PRAPARE - Administrator, Civil Service (Medical): No  Lack of Transportation (Non-Medical): No  Physical Activity: Inactive (12/12/2019)   Exercise Vital Sign    Days of Exercise per Week: 0 days    Minutes of Exercise per Session: 0 min  Stress: No Stress Concern Present (12/12/2019)   [employer] of Occupational Health - Occupational Stress Questionnaire    Feeling of Stress : Not at all  Social Connections: Moderately Integrated (12/12/2019)   Social Connection and Isolation Panel [NHANES]    Frequency of Communication with Friends and Family: More than three times a week    Frequency of Social Gatherings with Friends and Family: Three times a week    Attends Religious Services: 1 to 4 times per year    Active Member of Clubs or Organizations: No    Attends Banker Meetings: 1 to 4 times per year    Marital Status: Never married  Intimate Partner Violence: Not At Risk (12/12/2019)   Humiliation, Afraid, Rape, and Kick questionnaire    Fear of Current or Ex-Partner: No    Emotionally Abused: No    Physically Abused: No    Sexually Abused: No    FAMILY HISTORY:  Family History  Problem Relation Age of Onset   Non-Hodgkin's lymphoma Mother    Hypertension Mother    Diabetes Mellitus II Mother    Hypertension Father    Diabetes Mellitus II Father     Breast cancer Cousin    Breast cancer Maternal Aunt    Colon cancer Maternal Uncle     CURRENT MEDICATIONS:  Current Outpatient Medications  Medication Sig Dispense Refill   ACCU-CHEK AVIVA PLUS test strip SMARTSIG:Via Meter 6 Times Daily     Accu-Chek Softclix Lancets lancets 4 (four) times daily.     acetaminophen (TYLENOL) 500 MG tablet Take 1,000 mg by mouth every 6 (six) hours as needed. For pain     acyclovir (ZOVIRAX) 200 MG capsule Take 400 mg by mouth daily. Reported on 07/23/2015     albuterol (PROVENTIL HFA;VENTOLIN HFA) 108 (90 Base) MCG/ACT inhaler Inhale 2 puffs into the lungs every 4 (four) hours as needed for wheezing or shortness of breath.     atorvastatin (LIPITOR) 20 MG tablet Take 40 mg by mouth daily.     AUVELITY 45-105 MG TBCR Take by mouth in the morning and at bedtime.     Blood Glucose Monitoring Suppl (ACCU-CHEK GUIDE) w/Device KIT USE TO CHECK SUGAR     Continuous Blood Gluc Receiver (DEXCOM G6 RECEIVER) DEVI 1 Piece by Does not apply route as needed. 1 each 0   Continuous Blood Gluc Transmit (DEXCOM G6 TRANSMITTER) MISC USE AS DIRECTED, CHANGE EVERY 90 DAYS 1 each 1   Continuous Glucose Sensor (DEXCOM G6 SENSOR) MISC Use to monitor glucose 3 each 3   dicyclomine (BENTYL) 10 MG capsule Take 1 capsule (10 mg total) by mouth 3 (three) times daily as needed for spasms. 60 capsule 1   gabapentin (NEURONTIN) 300 MG capsule Take 300-600 mg by mouth See admin instructions. Take 1 capsule (300 mg) by mouth in the morning & take 2 capsules (600 mg) by mouth at night  6   hydrochlorothiazide (HYDRODIURIL) 25 MG tablet TAKE ONE TABLET BY MOUTH DAILY 90 tablet 0   INGREZZA 40 MG capsule Take 40 mg by mouth daily.     insulin lispro (HUMALOG KWIKPEN) 100 UNIT/ML KwikPen Inject 8-11 Units into the skin 3 (three) times daily before meals. 15 mL 2   lamoTRIgine (LAMICTAL) 100 MG tablet  Take 100 mg by mouth at bedtime.     LANTUS SOLOSTAR 100 UNIT/ML Solostar Pen Inject 22 Units  into the skin at bedtime. 15 mL 2   loratadine (CLARITIN) 10 MG tablet Take 10 mg by mouth daily.   6   losartan (COZAAR) 50 MG tablet TAKE 1 TABLET BY MOUTH EVERY DAY 90 tablet 0   mesalamine (CANASA) 1000 MG suppository Place 1 suppository (1,000 mg total) rectally at bedtime. 90 suppository 1   Mesalamine 800 MG TBEC TAKE 2 TABLETS BY MOUTH IN THE MORNING, AT NOON, AND AT BEDTIME 204 tablet 4   ondansetron (ZOFRAN-ODT) 8 MG disintegrating tablet Take 8 mg by mouth every 8 (eight) hours as needed.     pantoprazole (PROTONIX) 40 MG tablet Take 1 tablet (40 mg total) by mouth daily. 30 tablet 3   No current facility-administered medications for this visit.   Facility-Administered Medications Ordered in Other Visits  Medication Dose Route Frequency Provider Last Rate Last Admin   0.9 %  sodium chloride infusion   Intravenous Continuous Kefalas, Maurine Minister, PA-C       0.9 %  sodium chloride infusion   Intravenous Continuous Higgs, Minta Balsam, MD   Stopped at 05/26/17 1510    ALLERGIES:  Allergies  Allergen Reactions   Ace Inhibitors Itching   Peanut Oil Itching   Shellfish Allergy Itching and Rash   Statins Itching   Adhesive [Tape] Rash   Pravastatin Sodium Itching   Pedi-Pre Tape Spray [Wound Dressing Adhesive]    Shellfish-Derived Products     LABORATORY DATA:  I have reviewed the labs as listed.     Latest Ref Rng & Units 08/01/2022    9:52 AM 07/08/2021    1:41 PM 01/20/2021   10:53 AM  CBC  WBC 4.0 - 10.5 K/uL 3.8  5.0  3.9   Hemoglobin 12.0 - 15.0 g/dL 16.1  09.6  04.5   Hematocrit 36.0 - 46.0 % 37.8  39.4  40.3   Platelets 150 - 400 K/uL 164  189  165       Latest Ref Rng & Units 08/01/2022    9:52 AM 06/14/2022   10:04 AM 03/10/2022   10:50 AM  CMP  Glucose 70 - 99 mg/dL 409  811  914   BUN 6 - 20 mg/dL 12  10  9    Creatinine 0.44 - 1.00 mg/dL 7.82  9.56  2.13   Sodium 135 - 145 mmol/L 135  140  136   Potassium 3.5 - 5.1 mmol/L 4.1  4.2  3.9   Chloride 98 - 111 mmol/L 100   102  100   CO2 22 - 32 mmol/L 31  27  28    Calcium 8.9 - 10.3 mg/dL 8.7  9.3  8.7   Total Protein 6.5 - 8.1 g/dL 6.4  5.9    Total Bilirubin 0.3 - 1.2 mg/dL 0.9  0.8    Alkaline Phos 38 - 126 U/L 93  110    AST 15 - 41 U/L 18  17    ALT 0 - 44 U/L 18  14      DIAGNOSTIC IMAGING:  I have independently reviewed the scans and discussed with the patient. MM 3D SCREENING MAMMOGRAM BILATERAL BREAST  Result Date: 07/28/2022 CLINICAL DATA:  Screening. EXAM: DIGITAL SCREENING BILATERAL MAMMOGRAM WITH TOMOSYNTHESIS AND CAD TECHNIQUE: Bilateral screening digital craniocaudal and mediolateral oblique mammograms were obtained. Bilateral screening digital breast tomosynthesis was performed. The images were evaluated with  computer-aided detection. COMPARISON:  Previous exam(s). ACR Breast Density Category d: The breasts are extremely dense, which lowers the sensitivity of mammography. FINDINGS: There are no findings suspicious for malignancy. IMPRESSION: No mammographic evidence of malignancy. A result letter of this screening mammogram will be mailed directly to the patient. RECOMMENDATION: Screening mammogram in one year. (Code:SM-B-01Y) BI-RADS CATEGORY  1: Negative. Electronically Signed   By: Sherian Rein M.D.   On: 07/28/2022 12:52     WRAP UP:  All questions were answered. The patient knows to call the clinic with any problems, questions or concerns.  Medical decision making: ***  Time spent on visit: I spent {CHL ONC TIME VISIT - [id number]} counseling the patient face to face. The total time spent in the appointment was {CHL ONC TIME VISIT - YQMVH:[id number]} and more than 50% was on counseling.  Carnella Guadalajara, PA-C  ***

## 2022-08-02 ENCOUNTER — Inpatient Hospital Stay: Payer: Medicaid Other | Admitting: Physician Assistant

## 2022-08-02 VITALS — BP 135/75 | HR 69 | Temp 96.5°F | Resp 17 | Wt 183.2 lb

## 2022-08-02 DIAGNOSIS — E538 Deficiency of other specified B group vitamins: Secondary | ICD-10-CM | POA: Diagnosis not present

## 2022-08-02 DIAGNOSIS — Z171 Estrogen receptor negative status [ER-]: Secondary | ICD-10-CM | POA: Diagnosis not present

## 2022-08-02 DIAGNOSIS — D500 Iron deficiency anemia secondary to blood loss (chronic): Secondary | ICD-10-CM | POA: Diagnosis not present

## 2022-08-02 DIAGNOSIS — C50411 Malignant neoplasm of upper-outer quadrant of right female breast: Secondary | ICD-10-CM

## 2022-08-02 DIAGNOSIS — Z853 Personal history of malignant neoplasm of breast: Secondary | ICD-10-CM | POA: Diagnosis not present

## 2022-08-02 NOTE — Patient Instructions (Signed)
[facility] at [facility] Discharge Instructions  You were seen today by Rojelio Brenner PA-C for the following conditions.  HISTORY OF BREAST CANCER: You did not have any major abnormalities on physical exam or mammogram!   You will be due for another mammogram in July 2025  IRON DEFICIENCY: Your iron and blood levels look good.  You do not need any IV iron at this time.  We will check your levels again at your follow-up visit in 1 year.  FOLLOW-UP APPOINTMENT: Office visit in 1 year, after labs and mammogram   - - - - - - - - - - - - - - - - - -    Thank you for choosing [facility] at [facility] to provide your oncology and hematology care.  To afford each patient quality time with our provider, please arrive at least 15 minutes before your scheduled appointment time.   If you have a lab appointment with the Cancer Center please come in thru the Main Entrance and check in at the main information desk.  You need to re-schedule your appointment should you arrive 10 or more minutes late.  We strive to give you quality time with our providers, and arriving late affects you and other patients whose appointments are after yours.  Also, if you no show three or more times for appointments you may be dismissed from the clinic at the providers discretion.     Again, thank you for choosing [facility].  Our hope is that these requests will decrease the amount of time that you wait before being seen by our physicians.       _____________________________________________________________  Should you have questions after your visit to [facility], please contact our office at [phone] and follow the prompts.  Our office hours are 8:00 a.m. and 4:30 p.m. Monday - Friday.  Please note that voicemails left after 4:00 p.m. may not be returned until the following business day.  We are closed weekends and major holidays.  You do  have access to a nurse 24-7, just call the main number to the clinic [phone] and do not press any options, hold on the line and a nurse will answer the phone.    For prescription refill requests, have your pharmacy contact our office and allow 72 hours.    Due to Covid, you will need to wear a mask upon entering the hospital. If you do not have a mask, a mask will be given to you at the Main Entrance upon arrival. For doctor visits, patients may have 1 support person age 53 or older with them. For treatment visits, patients can not have anyone with them due to social distancing guidelines and our immunocompromised population.

## 2022-08-06 ENCOUNTER — Other Ambulatory Visit: Payer: Self-pay | Admitting: "Endocrinology

## 2022-09-06 LAB — LAB REPORT - SCANNED
A1c: 8.7
EGFR: 83

## 2022-10-05 ENCOUNTER — Other Ambulatory Visit: Payer: Self-pay | Admitting: "Endocrinology

## 2022-10-19 ENCOUNTER — Other Ambulatory Visit (INDEPENDENT_AMBULATORY_CARE_PROVIDER_SITE_OTHER): Payer: Self-pay | Admitting: Gastroenterology

## 2022-10-26 ENCOUNTER — Encounter (INDEPENDENT_AMBULATORY_CARE_PROVIDER_SITE_OTHER): Payer: Self-pay | Admitting: Gastroenterology

## 2022-11-21 ENCOUNTER — Other Ambulatory Visit (INDEPENDENT_AMBULATORY_CARE_PROVIDER_SITE_OTHER): Payer: Self-pay | Admitting: Gastroenterology

## 2022-11-21 ENCOUNTER — Other Ambulatory Visit: Payer: Self-pay | Admitting: "Endocrinology

## 2022-11-21 DIAGNOSIS — K51919 Ulcerative colitis, unspecified with unspecified complications: Secondary | ICD-10-CM

## 2022-11-21 DIAGNOSIS — K5190 Ulcerative colitis, unspecified, without complications: Secondary | ICD-10-CM

## 2022-11-21 DIAGNOSIS — K5120 Ulcerative (chronic) proctitis without complications: Secondary | ICD-10-CM

## 2022-11-21 DIAGNOSIS — K5130 Ulcerative (chronic) rectosigmoiditis without complications: Secondary | ICD-10-CM

## 2022-11-25 ENCOUNTER — Other Ambulatory Visit: Payer: Self-pay | Admitting: "Endocrinology

## 2022-12-05 ENCOUNTER — Ambulatory Visit (INDEPENDENT_AMBULATORY_CARE_PROVIDER_SITE_OTHER): Payer: Medicaid Other | Admitting: Gastroenterology

## 2022-12-08 ENCOUNTER — Ambulatory Visit: Payer: Medicaid Other | Admitting: "Endocrinology

## 2022-12-08 ENCOUNTER — Encounter: Payer: Self-pay | Admitting: "Endocrinology

## 2022-12-08 VITALS — BP 118/80 | HR 72 | Ht 61.0 in | Wt 192.6 lb

## 2022-12-08 DIAGNOSIS — E782 Mixed hyperlipidemia: Secondary | ICD-10-CM

## 2022-12-08 DIAGNOSIS — E559 Vitamin D deficiency, unspecified: Secondary | ICD-10-CM

## 2022-12-08 DIAGNOSIS — Z794 Long term (current) use of insulin: Secondary | ICD-10-CM

## 2022-12-08 DIAGNOSIS — Z6835 Body mass index (BMI) 35.0-35.9, adult: Secondary | ICD-10-CM

## 2022-12-08 DIAGNOSIS — E1065 Type 1 diabetes mellitus with hyperglycemia: Secondary | ICD-10-CM | POA: Diagnosis not present

## 2022-12-08 DIAGNOSIS — E66812 Obesity, class 2: Secondary | ICD-10-CM

## 2022-12-08 LAB — POCT GLYCOSYLATED HEMOGLOBIN (HGB A1C): HbA1c, POC (controlled diabetic range): 8.7 % — AB (ref 0.0–7.0)

## 2022-12-08 MED ORDER — LANTUS SOLOSTAR 100 UNIT/ML ~~LOC~~ SOPN: 20.0000 [IU] | PEN_INJECTOR | Freq: Every day | SUBCUTANEOUS | 2 refills | Status: DC

## 2022-12-08 NOTE — Patient Instructions (Signed)

## 2022-12-08 NOTE — Progress Notes (Signed)
12/08/2022, 2:24 PM     Endocrinology follow-up note  Subjective:    Patient ID: Brittney [name], female    DOB: 1970-04-21.  Brittney [name] is being seen in follow-up in the management of currently uncontrolled symptomatic type 1 diabetes, hypertension, hyperlipidemia. PMD: Richardean Chimera, MD.   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) [facility])     Past Surgical History:  Procedure Laterality Date   BIOPSY  12/05/2018   Procedure: BIOPSY;  Surgeon: Malissa Hippo, MD;  [facility];  Service: Endoscopy;;  duodenum gastric colon   BIOPSY  03/16/2022   Procedure: BIOPSY;  Surgeon: Dolores Frame, MD;  [facility];  Service: Gastroenterology;;   BREAST LUMPECTOMY Right 2017   CESAREAN SECTION  2007   COLONOSCOPY N/A 10/09/2013   Procedure: COLONOSCOPY;  Surgeon: Malissa Hippo, MD;  [facility]: [facility];  Service: Endoscopy;  Laterality: N/A;  100   COLONOSCOPY N/A 12/05/2018   Procedure: COLONOSCOPY;  Surgeon: Malissa Hippo, MD;  [facility];  Service: Endoscopy;  Laterality: N/A;   COLONOSCOPY WITH PROPOFOL N/A 03/16/2022   Procedure: COLONOSCOPY WITH PROPOFOL;  Surgeon: Dolores Frame, MD;  [facility];  Service: Gastroenterology;  Laterality: N/A;  830am, asa 1-2   DILATION AND CURETTAGE OF UTERUS     x 2 for miscarriages    ESOPHAGOGASTRODUODENOSCOPY     ESOPHAGOGASTRODUODENOSCOPY N/A 12/05/2018   Procedure: ESOPHAGOGASTRODUODENOSCOPY (EGD);  Surgeon: Malissa Hippo, MD;  [facility];  Service: Endoscopy;  Laterality: N/A;  200pm   FLEXIBLE SIGMOIDOSCOPY     FLEXIBLE SIGMOIDOSCOPY  07/27/2011   Procedure: FLEXIBLE SIGMOIDOSCOPY;  Surgeon: Malissa Hippo, MD;  [facility];  Service: Endoscopy;  Laterality: N/A;  100    Social History   Socioeconomic History   Marital status: Single    Spouse name: Not on file   Number of children: Not on file   Years of education: Not on file   Highest education level: Not on file  Occupational History   Not on file  Tobacco Use   Smoking status: Never    Passive exposure: Current   Smokeless tobacco: Never  Vaping Use   Vaping status: Never Used  Substance and Sexual Activity   Alcohol use: No    Alcohol/week: 0.0 standard drinks of alcohol   Drug use: No   Sexual activity: Not on file  Other Topics Concern   Not on file  Social History Narrative   Not on file   Social  Determinants of Health   Financial Resource Strain: Low Risk  (12/12/2019)   Overall Financial Resource Strain (CARDIA)    Difficulty of Paying Living Expenses: Not hard at all  Food Insecurity: No Food Insecurity (12/12/2019)   Hunger Vital Sign    Worried About Running Out of Food in the Last Year: Never true    Ran Out of Food in the Last Year: Never true  Transportation Needs: No Transportation Needs (12/12/2019)   PRAPARE - Administrator, Civil Service (Medical): No    Lack of Transportation (Non-Medical): No  Physical Activity: Inactive (12/12/2019)   Exercise Vital Sign    Days of Exercise per Week: 0 days    Minutes of Exercise per Session: 0 min  Stress: No Stress Concern Present (12/12/2019)   [employer] of Occupational Health - Occupational Stress Questionnaire    Feeling of Stress : Not at all  Social Connections: Moderately  Integrated (12/12/2019)   Social Connection and Isolation Panel [NHANES]    Frequency of Communication with Friends and Family: More than three times a week    Frequency of Social Gatherings with Friends and Family: Three times a week    Attends Religious Services: 1 to 4 times per year    Active Member of Clubs or Organizations: No    Attends Engineer, structural: 1 to 4 times per year    Marital Status: Never married    Family History  Problem Relation Age of Onset   Non-Hodgkin's lymphoma Mother    Hypertension Mother    Diabetes Mellitus II Mother    Hypertension Father    Diabetes Mellitus II Father    Breast cancer Cousin    Breast cancer Maternal Aunt    Colon cancer Maternal Uncle     Outpatient Encounter Medications as of 12/08/2022  Medication Sig   ACCU-CHEK AVIVA PLUS test strip SMARTSIG:Via Meter 6 Times Daily   Accu-Chek Softclix Lancets lancets 4 (four) times daily.   acetaminophen (TYLENOL) 500 MG tablet Take 1,000 mg by mouth every 6 (six) hours as needed. For pain   acyclovir (ZOVIRAX) 200 MG capsule Take 400 mg by mouth daily. Reported on 07/23/2015   albuterol (PROVENTIL HFA;VENTOLIN HFA) 108 (90 Base) MCG/ACT inhaler Inhale 2 puffs into the lungs every 4 (four) hours as needed for wheezing or shortness of breath.   atorvastatin (LIPITOR) 20 MG tablet Take 40 mg by mouth daily.   AUVELITY 45-105 MG TBCR Take by mouth in the morning and at bedtime.   Blood Glucose Monitoring Suppl (ACCU-CHEK GUIDE) w/Device KIT USE TO CHECK SUGAR   Continuous Blood Gluc Receiver (DEXCOM G6 RECEIVER) DEVI 1 Piece by Does not apply route as needed.   Continuous Glucose Sensor (DEXCOM G6 SENSOR) MISC USE AS DIRECTED, CHANGE SENSOR EVERY 10 DAYS   Continuous Glucose Transmitter (DEXCOM G6 TRANSMITTER) MISC USE AS DIRECTED, CHANGE EVERY 90 DAYS   dicyclomine (BENTYL) 10 MG capsule Take 1 capsule (10 mg total) by mouth 3 (three) times daily as needed for spasms.   gabapentin  (NEURONTIN) 300 MG capsule Take 300-600 mg by mouth See admin instructions. Take 1 capsule (300 mg) by mouth in the morning & take 2 capsules (600 mg) by mouth at night   hydrochlorothiazide (HYDRODIURIL) 25 MG tablet TAKE 1 TABLET BY MOUTH EVERY DAY   INGREZZA 40 MG capsule Take 40 mg by mouth daily.   insulin lispro (HUMALOG KWIKPEN) 100 UNIT/ML KwikPen Inject 8-11 Units into the skin  3 (three) times daily before meals.   lamoTRIgine (LAMICTAL) 100 MG tablet Take 100 mg by mouth at bedtime.   LANTUS SOLOSTAR 100 UNIT/ML Solostar Pen Inject 20 Units into the skin at bedtime.   loratadine (CLARITIN) 10 MG tablet Take 10 mg by mouth daily.    losartan (COZAAR) 50 MG tablet TAKE 1 TABLET BY MOUTH EVERY DAY   mesalamine (CANASA) 1000 MG suppository Place 1 suppository (1,000 mg total) rectally at bedtime.   Mesalamine 800 MG TBEC TAKE 2 TABLETS BY MOUTH IN THE MORNING, AT NOON, AND AT BEDTIME   ondansetron (ZOFRAN-ODT) 8 MG disintegrating tablet Take 8 mg by mouth every 8 (eight) hours as needed.   pantoprazole (PROTONIX) 40 MG tablet TAKE 1 TABLET(40 MG) BY MOUTH DAILY   [DISCONTINUED] LANTUS SOLOSTAR 100 UNIT/ML Solostar Pen Inject 22 Units into the skin at bedtime.   Facility-Administered Encounter Medications as of 12/08/2022  Medication   0.9 %  sodium chloride infusion   0.9 %  sodium chloride infusion    ALLERGIES: Allergies  Allergen Reactions   Ace Inhibitors Itching   Peanut Oil Itching   Shellfish Allergy Itching and Rash   Statins Itching   Adhesive [Tape] Rash   Pravastatin Sodium Itching   Pedi-Pre Tape Spray [Wound Dressing Adhesive]    Shellfish-Derived Products     VACCINATION STATUS: Immunization History  Administered Date(s) Administered   Influenza,inj,Quad PF,6+ Mos 10/22/2015   Influenza-Unspecified 10/16/2019   Pneumococcal Polysaccharide-23 01/03/2006   Td 10/04/2006   Tdap 07/10/1996    Diabetes She presents for her follow-up diabetic visit. She has  type 1 diabetes mellitus. Onset time: She was diagnosed at approximate age of 17 years. Her disease course has been worsening. Pertinent negatives for hypoglycemia include no confusion, nervousness/anxiousness, pallor or seizures. Associated symptoms include fatigue, polydipsia and polyuria. Pertinent negatives for diabetes include no chest pain and no polyphagia. There are no hypoglycemic complications. Symptoms are worsening. There are no diabetic complications. Risk factors for coronary artery disease include diabetes mellitus, dyslipidemia, obesity and sedentary lifestyle. Current diabetic treatment includes insulin injections. Her weight is increasing steadily. She is following a generally unhealthy diet. When asked about meal planning, she reported none. She has not had a previous visit with a dietitian. Her home blood glucose trend is fluctuating minimally. Her breakfast blood glucose range is generally 180-200 mg/dl. Her lunch blood glucose range is generally >200 mg/dl. Her dinner blood glucose range is generally 180-200 mg/dl. Her bedtime blood glucose range is generally >200 mg/dl. Her overall blood glucose range is 180-200 mg/dl. (Brittney [name] presents with still significantly fluctuating glycemic profile averaging 207 mg per DL over the last 14 days.  Her Dexcom CGM shows 38% time in range, 24% level 1 hyperglycemia, 32% level 2 hyperglycemia.  She has 4% hypoglycemia.   Her point-of-care A1c is 8.7% increasing from 8.2% during her last visit.  ) An ACE inhibitor/angiotensin II receptor blocker is not being taken.  Hyperlipidemia This is a chronic problem. The current episode started more than 1 year ago. The problem is controlled. Pertinent negatives include no chest pain, myalgias or shortness of breath. Current antihyperlipidemic treatment includes statins (She was treated with atorvastatin in the past, did not continue on this medication complaining about cost.). Risk factors for coronary artery  disease include diabetes mellitus, dyslipidemia, obesity and family history.    Review of systems  Constitutional: + Minimally fluctuating body weight,  current  Body mass index is 36.39 kg/m. , no  fatigue, no subjective hyperthermia, no subjective hypothermia    Objective:    BP 118/80   Pulse 72   Ht 5\' 1"  (1.549 m)   Wt 192 lb 9.6 oz (87.4 kg)   BMI 36.39 kg/m   Wt Readings from Last 3 Encounters:  12/08/22 192 lb 9.6 oz (87.4 kg)  08/02/22 183 lb 3.2 oz (83.1 kg)  07/20/22 184 lb 12.8 oz (83.8 kg)     Physical Exam- Limited  Constitutional:  Body mass index is 36.39 kg/m. , not in acute distress, normal state of mind   Recent Results (from the past 2160 hour(s))  HgB A1c     Status: Abnormal   Collection Time: 12/08/22 11:36 AM  Result Value Ref Range   Hemoglobin A1C     HbA1c POC (<> result, manual entry)     HbA1c, POC (prediabetic range)     HbA1c, POC (controlled diabetic range) 8.7 (A) 0.0 - 7.0 %     Diabetic Labs (most recent): Lab Results  Component Value Date   HGBA1C 8.7 (A) 12/08/2022   HGBA1C 8.2 (A) 07/20/2022   HGBA1C 8.1 (A) 02/14/2022   MICROALBUR 80 06/09/2020   MICROALBUR 2.7 12/02/2019   MICROALBUR 2.3 05/23/2018     Lipid Panel ( most recent) Lipid Panel     Component Value Date/Time   CHOL 156 06/14/2022 1004   TRIG 61 06/14/2022 1004   HDL 67 06/14/2022 1004   CHOLHDL 2.3 06/14/2022 1004   CHOLHDL 2.3 08/29/2019 0942   LDLCALC 77 06/14/2022 1004   LDLCALC 73 08/29/2019 0942      Latest Ref Rng & Units 08/01/2022    9:52 AM 06/14/2022   10:04 AM 03/10/2022   10:50 AM  CMP  Glucose 70 - 99 mg/dL 086  578  469   BUN 6 - 20 mg/dL 12  10  9    Creatinine 0.44 - 1.00 mg/dL 6.29  5.28  4.13   Sodium 135 - 145 mmol/L 135  140  136   Potassium 3.5 - 5.1 mmol/L 4.1  4.2  3.9   Chloride 98 - 111 mmol/L 100  102  100   CO2 22 - 32 mmol/L 31  27  28    Calcium 8.9 - 10.3 mg/dL 8.7  9.3  8.7   Total Protein 6.5 - 8.1 g/dL 6.4  5.9     Total Bilirubin 0.3 - 1.2 mg/dL 0.9  0.8    Alkaline Phos 38 - 126 U/L 93  110    AST 15 - 41 U/L 18  17    ALT 0 - 44 U/L 18  14       Assessment & Plan:   1. Uncontrolled type 1 diabetes mellitus with hyperglycemia (HCC)  - Brittney [name] has currently uncontrolled symptomatic type 2 DM since 52 years of age.  Brittney [name] presents with still significantly fluctuating glycemic profile averaging 207 mg per DL over the last 14 days.  Her Dexcom CGM shows 38% time in range, 24% level 1 hyperglycemia, 32% level 2 hyperglycemia.  She has 4% hypoglycemia.   Her point-of-care A1c is 8.7% increasing from 8.2% during her last visit.     Her recent anti-GAD antibody elevated at 61 suggesting type 1 diabetes.   -her diabetes is complicated by obesity/sedentary life and she remains at a high risk for more acute and chronic complications which include CAD, CVA, CKD, retinopathy, and neuropathy. These are all discussed in detail with her.  -  I have counseled her on diet management and weight loss, by adopting a carbohydrate restricted/protein rich diet.   - she acknowledges that there is a room for improvement in her food and drink choices. - Suggestion is made for her to avoid simple carbohydrates  from her diet including Cakes, Sweet Desserts, Ice Cream, Soda (diet and regular), Sweet Tea, Candies, Chips, Cookies, Store Bought Juices, Alcohol , Artificial Sweeteners,  Coffee Creamer, and "Sugar-free" Products, Lemonade. This will help patient to have more stable blood glucose profile and potentially avoid unintended weight gain.  The following Lifestyle Medicine recommendations according to American College of Lifestyle Medicine  [facility]) were discussed and and offered to patient and she  agrees to start the journey:  A. Whole Foods, Plant-Based Nutrition comprising of fruits and vegetables, plant-based proteins, whole-grain carbohydrates was discussed in detail with the patient.   A list for  source of those nutrients were also provided to the patient.  Patient will use only water or unsweetened tea for hydration. B.  The need to stay away from risky substances including alcohol, smoking; obtaining 7 to 9 hours of restorative sleep, at least 150 minutes of moderate intensity exercise weekly, the importance of healthy social connections,  and stress management techniques were discussed. C.  A full color page of  Calorie density of various food groups per pound showing examples of each food groups was provided to the patient.  - I encouraged her to switch to  unprocessed or minimally processed complex starch and increased protein intake (mostly plant source), fruits, and vegetables.  - she is advised to stick to a routine mealtimes to eat 3 meals  a day and avoid unnecessary snacks ( to snack only to correct hypoglycemia).    - I have approached her with the following individualized plan to manage diabetes and patient agrees:   -She will continue to need intensive treatment with basal/bolus insulin in order for her to achieve and maintain control of diabetes to target.     -Based on her presentation with hypoglycemia unawareness, fluctuating glycemic profile, she will continue to benefit from CGM.  She is advised to wear her Dexcom device all the time.   -She is advised to decrease her Lantus to 20 units nightly, continue Humalog at 8-11 units for pre-meal blood glucose readings above 80 mg/day.  He is allowed to use tight scale  sliding scale Humalog for  hyperglycemia above 150 mg per DL.  She is advised to continue monitoring blood glucose 4 times a day-before meals and at bedtime.  - she is warned not to take insulin without proper monitoring per orders. - Adjustment parameters are given to her for hypo and hyperglycemia in writing. - she is encouraged to call clinic for blood glucose levels less than 70 or above 200 mg /dl.  - she is not a suitable candidate  for metformin, SGLT2  inhibitors, nor incretin therapy.   - Patient specific target  A1c;  LDL, HDL, Triglycerides, were discussed in detail.  2) Blood Pressure /Hypertension:  Her blood pressure is controlled to target.  She has urine microalbuminuria.  She is currently  on losartan 50 mg daily.  I discussed and added hydrochlorothiazide 25 mg p.o. daily at breakfast.   3) Lipids/Hyperlipidemia: Review of her most recent lipid panel showed improved LDL at 77 from 113.    She is advised to continue atorvastatin 20 mg p.o. nightly.  Side effects and precautions discussed with her.  4)  Weight/Diet: Her BMI is 36.39--  clearly complicating her diabetes care.  A candidate for modest weight loss, may avoid further weight gain by avoiding unnecessary snacks.  I discussed with her the fact that loss of 5 - 10% of her  current body weight will have the most impact on her diabetes management.   CDE Consult will be initiated . Exercise, and detailed carbohydrates information provided  -  detailed on discharge instructions.  5) Chronic Care/Health Maintenance:  -she  Is not on ACEI/ARB and Statin medications and  is encouraged to initiate and continue to follow up with Ophthalmology, Dentist,  Podiatrist at least yearly or according to recommendations, and advised to   stay away from smoking. I have recommended yearly flu vaccine and pneumonia vaccine at least every 5 years; moderate intensity exercise for up to 150 minutes weekly; and  sleep for at least 7 hours a day.  - she is  advised to maintain close follow up with Richardean Chimera, MD for primary care needs, as well as her other providers for optimal and coordinated care.   I spent  42  minutes in the care of the patient today including review of labs from CMP, Lipids, Thyroid Function, Hematology (current and previous including abstractions from other facilities); face-to-face time discussing  her blood glucose readings/logs, discussing hypoglycemia and  hyperglycemia episodes and symptoms, medications doses, her options of short and long term treatment based on the latest standards of care / guidelines;  discussion about incorporating lifestyle medicine;  and documenting the encounter. Risk reduction counseling performed per USPSTF guidelines to reduce  obesity and cardiovascular risk factors.     Please refer to Patient Instructions for Blood Glucose Monitoring and Insulin/Medications Dosing Guide"  in media tab for additional information. Please  also refer to " Patient Self Inventory" in the Media  tab for reviewed elements of pertinent patient history.  Brittney [name] participated in the discussions, expressed understanding, and voiced agreement with the above plans.  All questions were answered to her satisfaction. she is encouraged to contact clinic should she have any questions or concerns prior to her return visit.    Follow up plan: - Return in about 4 months (around 04/08/2023).  Marquis Lunch, MD [facility] [address] [place], [place] [address] Phone: [phone]  Fax: [phone]    12/08/2022, 2:24 PM  This note was partially dictated with voice recognition software. Similar sounding words can be transcribed inadequately or may not  be corrected upon review.

## 2022-12-12 ENCOUNTER — Other Ambulatory Visit (INDEPENDENT_AMBULATORY_CARE_PROVIDER_SITE_OTHER): Payer: Self-pay

## 2022-12-12 DIAGNOSIS — K5130 Ulcerative (chronic) rectosigmoiditis without complications: Secondary | ICD-10-CM

## 2022-12-12 MED ORDER — MESALAMINE 1000 MG RE SUPP
1000.0000 mg | Freq: Every day | RECTAL | 1 refills | Status: DC
Start: 2022-12-12 — End: 2023-11-24

## 2022-12-12 NOTE — Telephone Encounter (Signed)
Needs refills on Mesalamine suppositories sent to [facility].

## 2022-12-13 ENCOUNTER — Telehealth (INDEPENDENT_AMBULATORY_CARE_PROVIDER_SITE_OTHER): Payer: Self-pay | Admitting: Gastroenterology

## 2022-12-13 ENCOUNTER — Encounter (INDEPENDENT_AMBULATORY_CARE_PROVIDER_SITE_OTHER): Payer: Self-pay | Admitting: Gastroenterology

## 2022-12-13 ENCOUNTER — Ambulatory Visit (INDEPENDENT_AMBULATORY_CARE_PROVIDER_SITE_OTHER): Payer: Medicaid Other | Admitting: Gastroenterology

## 2022-12-13 VITALS — BP 115/61 | HR 77 | Temp 98.0°F | Ht 61.0 in | Wt 189.8 lb

## 2022-12-13 DIAGNOSIS — K5130 Ulcerative (chronic) rectosigmoiditis without complications: Secondary | ICD-10-CM

## 2022-12-13 DIAGNOSIS — R140 Abdominal distension (gaseous): Secondary | ICD-10-CM | POA: Diagnosis not present

## 2022-12-13 DIAGNOSIS — K219 Gastro-esophageal reflux disease without esophagitis: Secondary | ICD-10-CM | POA: Diagnosis not present

## 2022-12-13 NOTE — Progress Notes (Addendum)
Referring Provider: Richardean Chimera, MD Primary Care Physician:  Richardean Chimera, MD Primary GI Physician: Dr. Levon Hedger   Chief Complaint  Patient presents with   Gastroesophageal Reflux    Follow up on GERD. Feels bloated after eating. Still having acid reflux. Feels full even though not eating a lot. Takes protonix once daily.    ulcerative rectosigmoiditis    Follow up on ulcerative rectosigmoiditis. States doing well with mesalamine.     HPI:   Brittney [name] is a 52 y.o. female with past medical history of ulcerative proctosigmoiditis, breast cancer, GERD, gastric disease, type 1 diabetes   Patient presenting today for follow upo of GERD and Ulcerative proctosigmoiditis  Last seen may 2024 at that time feeling okay overall.  Noted some fecal urgency at times.  Occasional fecal soiling, 1-2 stools per day that are formed.  Denied rectal eating or melena.  Denied abdominal pain.  Having more belching and bloating.  Occasional heartburn or acid regurgitation.  On omeprazole 20 mg twice daily at that time.  Recommended continue mesalamine 1.6 g 3 times daily, continue Canasa 1 g suppository nightly, start dicyclomine 10 mg 3 times daily as needed, start Protonix 40 mg daily, consider H. pylori testing if upper GI symptoms not resolving with change of PPI therapy.  Consider fecal calprotectin at next visit if fecal urgency persisting.  Present: States that symptoms improved some but still having bloating, some belching and occasional heartburn. Occasionally will feel that foods get stuck in upper chest. Had some nausea for a few days last month but none since then. She states that sometimes she really does not feel very hungry, eating some less than she used to. Denies any NSAID use, no recent new medications.   Having 1-2 stools per day that are formed. Note that fecal urgency has improved since last visit. Has very rare fecal incontinence. She is taking bentyl 10mg  maybe every  other day which seems to have helped some with her symptoms. Denies rectal bleeding or melena. She has slacked off on using canasa suppositories as she has felt well, notes using these very infrequently.   She had basic labs recently with PCP   Last EGD: 12/05/2018 - Normal esophagus. - Z-line irregular, 40 cm from the incisors. - Portal hypertensive gastropathy. - A few gastric polyps. Biopsied. - Normal duodenal bulb and second portion of the duodenum. Biopsied.  A. DUODENUM, BIOPSY:  - Duodenal mucosa with no significant pathologic findings.  - Negative for increased intraepithelial lymphocytes and villous  architectural changes.  B. STOMACH, POLYPECTOMY:  - Fundic gland polyps.  Last Colonoscopy: 03/2022 - The examined portion of the ileum was normal.                           - Mild (Mayo Score 1) ulcerative colitis, improved                            since the last examination. Biopsied.                           - The distal rectum and anal verge are normal on                            retroflexion view. 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) [facility])     Past Surgical History:  Procedure Laterality Date   BIOPSY  12/05/2018   Procedure: BIOPSY;  Surgeon: Malissa Hippo, MD;  [facility];  Service: Endoscopy;;  duodenum gastric colon   BIOPSY  03/16/2022   Procedure: BIOPSY;  Surgeon: Dolores Frame, MD;  [facility];  Service: Gastroenterology;;   BREAST LUMPECTOMY Right 2017   CESAREAN SECTION  2007   COLONOSCOPY N/A 10/09/2013   Procedure: COLONOSCOPY;  Surgeon: Malissa Hippo, MD;  [facility];  Service: Endoscopy;  Laterality: N/A;  100   COLONOSCOPY N/A 12/05/2018   Procedure: COLONOSCOPY;  Surgeon: Malissa Hippo, MD;  [facility];  Service: Endoscopy;  Laterality: N/A;   COLONOSCOPY WITH PROPOFOL N/A 03/16/2022   Procedure: COLONOSCOPY WITH PROPOFOL;  Surgeon: Dolores Frame, MD;  [facility];  Service: Gastroenterology;  Laterality: N/A;  830am, asa 1-2   DILATION AND CURETTAGE OF UTERUS     x 2 for miscarriages   ESOPHAGOGASTRODUODENOSCOPY     ESOPHAGOGASTRODUODENOSCOPY N/A 12/05/2018   Procedure: ESOPHAGOGASTRODUODENOSCOPY (EGD);  Surgeon: Malissa Hippo, MD;  [facility];  Service: Endoscopy;  Laterality: N/A;  200pm   FLEXIBLE SIGMOIDOSCOPY     FLEXIBLE SIGMOIDOSCOPY  07/27/2011   Procedure: FLEXIBLE SIGMOIDOSCOPY;  Surgeon: Malissa Hippo, MD;  [facility];  Service: Endoscopy;  Laterality: N/A;  100    Current Outpatient Medications  Medication Sig Dispense Refill   ACCU-CHEK AVIVA PLUS test strip SMARTSIG:Via Meter 6 Times Daily     Accu-Chek Softclix Lancets lancets 4 (four) times daily.     acetaminophen (TYLENOL) 500 MG tablet Take 1,000 mg by mouth every 6 (six) hours as needed. For pain     acyclovir (ZOVIRAX) 200 MG capsule Take 400 mg by mouth daily. Reported on 07/23/2015     albuterol (PROVENTIL HFA;VENTOLIN HFA) 108 (90 Base) MCG/ACT inhaler Inhale 2 puffs into the lungs every 4 (four) hours as needed for wheezing or shortness of breath.     atorvastatin (LIPITOR) 20 MG tablet Take 40 mg by mouth daily.     AUVELITY 45-105 MG TBCR Take by mouth in the morning and at bedtime.     Blood Glucose Monitoring Suppl (ACCU-CHEK GUIDE) w/Device KIT USE TO CHECK SUGAR     Continuous Blood Gluc  Receiver (DEXCOM G6 RECEIVER) DEVI 1 Piece by Does not apply route as needed. 1 each 0   Continuous Glucose Sensor (DEXCOM G6 SENSOR) MISC USE AS DIRECTED, CHANGE SENSOR EVERY 10 DAYS 3 each 2   Continuous Glucose Transmitter (DEXCOM G6 TRANSMITTER) MISC USE AS DIRECTED, CHANGE EVERY 90 DAYS 1 each 1   dicyclomine (BENTYL) 10 MG capsule Take 1 capsule (10 mg total) by mouth 3 (three) times daily as needed for spasms. 60 capsule 1   gabapentin (NEURONTIN) 300 MG capsule Take 300-600 mg  by mouth See admin instructions. Take 1 capsule (300 mg) by mouth in the morning & take 2 capsules (600 mg) by mouth at night  6   hydrochlorothiazide (HYDRODIURIL) 25 MG tablet TAKE 1 TABLET BY MOUTH EVERY DAY 90 tablet 0   INGREZZA 40 MG capsule Take 40 mg by mouth daily.     insulin lispro (HUMALOG KWIKPEN) 100 UNIT/ML KwikPen Inject 8-11 Units into the skin 3 (three) times daily before meals. 15 mL 2   lamoTRIgine (LAMICTAL) 100 MG tablet Take 100 mg by mouth at bedtime.     LANTUS SOLOSTAR 100 UNIT/ML Solostar Pen Inject 20 Units into the skin at bedtime. 15 mL 2   loratadine (CLARITIN) 10 MG tablet Take 10 mg by mouth daily.   6   losartan (COZAAR) 50 MG tablet TAKE 1 TABLET BY MOUTH EVERY DAY 90 tablet 0   mesalamine (CANASA) 1000 MG suppository Place 1 suppository (1,000 mg total) rectally at bedtime. 90 suppository 1   Mesalamine 800 MG TBEC TAKE 2 TABLETS BY MOUTH IN THE MORNING, AT NOON, AND AT BEDTIME 204 tablet 4   ondansetron (ZOFRAN-ODT) 8 MG disintegrating tablet Take 8 mg by mouth every 8 (eight) hours as needed.     pantoprazole (PROTONIX) 40 MG tablet TAKE 1 TABLET(40 MG) BY MOUTH DAILY 30 tablet 3   No current facility-administered medications for this visit.   Facility-Administered Medications Ordered in Other Visits  Medication Dose Route Frequency Provider Last Rate Last Admin   0.9 %  sodium chloride infusion   Intravenous Continuous Kefalas, Maurine Minister, PA-C       0.9 %  sodium chloride  infusion   Intravenous Continuous Higgs, Minta Balsam, MD   Stopped at 05/26/17 1510    Allergies as of 12/13/2022 - Review Complete 12/13/2022  Allergen Reaction Noted   Ace inhibitors Itching 06/11/2015   Peanut oil Itching 10/09/2013   Shellfish allergy Itching and Rash 10/09/2013   Statins Itching 04/09/2013   Adhesive [tape] Rash 06/25/2015   Pravastatin sodium Itching 06/11/2015   Pedi-pre tape spray [wound dressing adhesive]  07/20/2020   Shellfish-derived products  07/20/2020    Family History  Problem Relation Age of Onset   Non-Hodgkin's lymphoma Mother    Hypertension Mother    Diabetes Mellitus II Mother    Hypertension Father    Diabetes Mellitus II Father    Breast cancer Cousin    Breast cancer Maternal Aunt    Colon cancer Maternal Uncle     Social History   Socioeconomic History   Marital status: Single    Spouse name: Not on file   Number of children: Not on file   Years of education: Not on file   Highest education level: Not on file  Occupational History   Not on file  Tobacco Use   Smoking status: Never    Passive exposure: Current   Smokeless tobacco: Never  Vaping Use   Vaping status: Never Used  Substance and Sexual Activity   Alcohol use: No    Alcohol/week: 0.0 standard drinks of alcohol   Drug use: No   Sexual activity: Not on file  Other Topics Concern   Not on file  Social History Narrative   Not on file   Social Determinants of Health   Financial Resource Strain: Low Risk  (12/12/2019)   Overall Financial Resource Strain (CARDIA)    Difficulty of Paying Living Expenses: Not hard at all  Food Insecurity: No Food Insecurity (12/12/2019)  Hunger Vital Sign    Worried About Running Out of Food in the Last Year: Never true    Ran Out of Food in the Last Year: Never true  Transportation Needs: No Transportation Needs (12/12/2019)   PRAPARE - Administrator, Civil Service (Medical): No    Lack of Transportation (Non-Medical):  No  Physical Activity: Inactive (12/12/2019)   Exercise Vital Sign    Days of Exercise per Week: 0 days    Minutes of Exercise per Session: 0 min  Stress: No Stress Concern Present (12/12/2019)   [employer] of Occupational Health - Occupational Stress Questionnaire    Feeling of Stress : Not at all  Social Connections: Moderately Integrated (12/12/2019)   Social Connection and Isolation Panel [NHANES]    Frequency of Communication with Friends and Family: More than three times a week    Frequency of Social Gatherings with Friends and Family: Three times a week    Attends Religious Services: 1 to 4 times per year    Active Member of Clubs or Organizations: No    Attends Engineer, structural: 1 to 4 times per year    Marital Status: Never married    Review of systems General: negative for malaise, night sweats, fever, chills, weight loss Neck: Negative for lumps, goiter, pain and significant neck swelling Resp: Negative for cough, wheezing, dyspnea at rest CV: Negative for chest pain, leg swelling, palpitations, orthopnea GI: denies melena, hematochezia, nausea, vomiting, diarrhea, constipation, odyonophagia, early satiety or unintentional weight loss. +dysphagia +bloating +heartburn MSK: Negative for joint pain or swelling, back pain, and muscle pain. Derm: Negative for itching or rash Psych: Denies depression, anxiety, memory loss, confusion. No homicidal or suicidal ideation.  Heme: Negative for prolonged bleeding, bruising easily, and swollen nodes. Endocrine: Negative for cold or heat intolerance, polyuria, polydipsia and goiter. Neuro: negative for tremor, gait imbalance, syncope and seizures. The remainder of the review of systems is noncontributory.  Physical Exam: BP 115/61 (BP Location: Left Arm, Patient Position: Sitting, Cuff Size: Large)   Pulse 77   Temp 98 F (36.7 C) (Oral)   Ht 5\' 1"  (1.549 m)   Wt 189 lb 12.8 oz (86.1 kg)   BMI 35.86 kg/m   General:   Alert and oriented. No distress noted. Pleasant and cooperative.  Head:  Normocephalic and atraumatic. Eyes:  Conjuctiva clear without scleral icterus. Mouth:  Oral mucosa pink and moist. Good dentition. No lesions. Heart: Normal rate and rhythm, s1 and s2 heart sounds present.  Lungs: Clear lung sounds in all lobes. Respirations equal and unlabored. Abdomen:  +BS, soft, non-tender and non-distended. No rebound or guarding. No HSM or masses noted. Derm: No palmar erythema or jaundice Msk:  Symmetrical without gross deformities. Normal posture. Extremities:  Without edema. Neurologic:  Alert and  oriented x4 Psych:  Alert and cooperative. Normal mood and affect.  Invalid input(s): "6 MONTHS"   ASSESSMENT: Brittney [name] is a 52 y.o. female presenting today for follow up of UC and GERD  UC: Colonoscopy in March 2024 with mild active UC, improved from last examination.  Having 1-2 formed stools per day.  Fecal urgency has improved since last visit.  Very rare fecal incontinence.  Taking Bentyl 10 mg every other day which has helped with her symptoms.  Not using Canasa suppositories at this time as she was feeling better.  Will continue with mesalamine 1.6 g 3 times daily, check inflammatory markers, obtain recent labs from PCP  for review, continue Bentyl 10 mg up to 3 3 times daily as needed.  GERD: Continues to have some bloating, belching and occasional heartburn despite pantoprazole 40 mg daily.  She reports that food gets stuck in upper chest.  Often does not feel very hungry.  For now recommend continuing with Protonix 40 mg daily, schedule EGD for further evaluation of her symptoms as I cannot rule out PUD, gastritis, duodenitis, uncontrolled GERD, and relation to her dysphagia cannot rule out esophageal web, ring, stenosis, stricture, esophagitis. Indications, risks and benefits of procedure discussed in detail with patient. Patient verbalized understanding and is in  agreement to proceed with EGD.   PLAN:  -Obtain labs from PCP  -Continue mesalamine 1.6 g 3 times daily -Check fecal calprotectin/CRP -Continue with protonix 40mg  daily -Continue bentyl 10mg  Up to TID PRN -schedule EGD ASA II   All questions were answered, patient verbalized understanding and is in agreement with plan as outlined above.   Follow Up: 4 months   Adlai Nieblas L. Jeanmarie Hubert, MSN, APRN, AGNP-C Adult-Gerontology Nurse Practitioner [facility]  I have reviewed the note and agree with the APP's assessment as described in this progress note   Katrinka Blazing, MD Gastroenterology and Hepatology [facility]

## 2022-12-13 NOTE — Telephone Encounter (Signed)
PA pending via availity for egd +/- ed

## 2022-12-13 NOTE — Patient Instructions (Addendum)
We will check inflammatory markers Continue with protonix daily, bentyl as needed We will schedule EGD Continue with messalamine 1.6g three times per day  Follow up 4 months  It was a pleasure to see you today. I want to create trusting relationships with patients and provide genuine, compassionate, and quality care. I truly value your feedback! please be on the lookout for a survey regarding your visit with me today. I appreciate your input about our visit and your time in completing this!    Ayvin Lipinski L. Jeanmarie Hubert, MSN, APRN, AGNP-C Adult-Gerontology Nurse Practitioner [facility]

## 2022-12-19 NOTE — Telephone Encounter (Signed)
Fax from [employer] approving EGD +/*- ED. Valid from 12/13/22-01/11/23 PA number [id number]

## 2022-12-26 ENCOUNTER — Other Ambulatory Visit: Payer: Self-pay | Admitting: "Endocrinology

## 2023-01-18 ENCOUNTER — Encounter (HOSPITAL_COMMUNITY): Payer: Self-pay

## 2023-01-18 ENCOUNTER — Encounter (HOSPITAL_COMMUNITY)
Admission: RE | Admit: 2023-01-18 | Discharge: 2023-01-18 | Disposition: A | Discharge status: Home / Self Care | Payer: Medicaid Other | Source: Ambulatory Visit | Attending: Gastroenterology | Admitting: Gastroenterology

## 2023-01-18 HISTORY — DX: Anxiety disorder, unspecified: F41.9

## 2023-01-18 HISTORY — DX: Essential (primary) hypertension: I10

## 2023-01-18 HISTORY — DX: Unspecified asthma, uncomplicated: J45.909

## 2023-01-18 HISTORY — DX: Depression, unspecified: F32.A

## 2023-01-19 LAB — C-REACTIVE PROTEIN: CRP: <3 mg/L (ref <8.0)

## 2023-01-24 ENCOUNTER — Other Ambulatory Visit: Payer: Self-pay | Admitting: "Endocrinology

## 2023-01-24 ENCOUNTER — Ambulatory Visit (HOSPITAL_COMMUNITY)
Admission: RE | Admit: 2023-01-24 | Discharge: 2023-01-24 | Disposition: A | Discharge status: Home / Self Care | Payer: Medicaid Other | Attending: Gastroenterology | Admitting: Gastroenterology

## 2023-01-24 ENCOUNTER — Ambulatory Visit (HOSPITAL_COMMUNITY): Payer: Self-pay | Admitting: Anesthesiology

## 2023-01-24 ENCOUNTER — Encounter (HOSPITAL_COMMUNITY)
Admission: RE | Disposition: A | Discharge status: Home / Self Care | Payer: Self-pay | Source: Home / Self Care | Attending: Gastroenterology

## 2023-01-24 DIAGNOSIS — I10 Essential (primary) hypertension: Secondary | ICD-10-CM | POA: Diagnosis not present

## 2023-01-24 DIAGNOSIS — K449 Diaphragmatic hernia without obstruction or gangrene: Secondary | ICD-10-CM

## 2023-01-24 DIAGNOSIS — F32A Depression, unspecified: Secondary | ICD-10-CM | POA: Diagnosis not present

## 2023-01-24 DIAGNOSIS — K2289 Other specified disease of esophagus: Secondary | ICD-10-CM | POA: Insufficient documentation

## 2023-01-24 DIAGNOSIS — K317 Polyp of stomach and duodenum: Secondary | ICD-10-CM

## 2023-01-24 DIAGNOSIS — J45909 Unspecified asthma, uncomplicated: Secondary | ICD-10-CM | POA: Diagnosis not present

## 2023-01-24 DIAGNOSIS — K219 Gastro-esophageal reflux disease without esophagitis: Secondary | ICD-10-CM | POA: Diagnosis not present

## 2023-01-24 DIAGNOSIS — R1319 Other dysphagia: Secondary | ICD-10-CM

## 2023-01-24 DIAGNOSIS — R140 Abdominal distension (gaseous): Secondary | ICD-10-CM | POA: Insufficient documentation

## 2023-01-24 DIAGNOSIS — K2100 Gastro-esophageal reflux disease with esophagitis, without bleeding: Secondary | ICD-10-CM

## 2023-01-24 DIAGNOSIS — F419 Anxiety disorder, unspecified: Secondary | ICD-10-CM | POA: Diagnosis not present

## 2023-01-24 DIAGNOSIS — Z794 Long term (current) use of insulin: Secondary | ICD-10-CM | POA: Diagnosis not present

## 2023-01-24 DIAGNOSIS — E119 Type 2 diabetes mellitus without complications: Secondary | ICD-10-CM | POA: Insufficient documentation

## 2023-01-24 DIAGNOSIS — R1310 Dysphagia, unspecified: Secondary | ICD-10-CM

## 2023-01-24 HISTORY — PX: ESOPHAGOGASTRODUODENOSCOPY (EGD) WITH PROPOFOL: SHX5813

## 2023-01-24 HISTORY — PX: ESOPHAGEAL DILATION: SHX303

## 2023-01-24 HISTORY — PX: BIOPSY: SHX5522

## 2023-01-24 LAB — GLUCOSE, CAPILLARY: Glucose-Capillary: 278 mg/dL — ABNORMAL HIGH (ref 70–99)

## 2023-01-24 SURGERY — ESOPHAGOGASTRODUODENOSCOPY (EGD) WITH PROPOFOL
Anesthesia: General

## 2023-01-24 MED ORDER — PROPOFOL 10 MG/ML IV BOLUS
INTRAVENOUS | Status: DC | PRN
  Administered 2023-01-24: 100 mg via INTRAVENOUS
  Administered 2023-01-24 (×2): 50 mg via INTRAVENOUS

## 2023-01-24 MED ORDER — LACTATED RINGERS IV SOLN: INTRAVENOUS | Status: DC

## 2023-01-24 MED ORDER — LIDOCAINE HCL (CARDIAC) PF 100 MG/5ML IV SOSY
PREFILLED_SYRINGE | INTRAVENOUS | Status: DC | PRN
  Administered 2023-01-24: 100 mg via INTRATRACHEAL

## 2023-01-24 NOTE — H&P (Addendum)
Brittney [name] is an 53 y.o. female.   Chief Complaint: abdominal distention HPI: Brittney [name] is a 53 y.o. female with past medical history of ulcerative proctosigmoiditis, breast cancer, GERD, gastric disease, type 1 diabetes, coming for evaluation of abdominal bloating and dysphagia.  Has intermittent abdominal bloating. No clear triggers. Bloating causes abdominal discomfort but no pain.  Taking Bentyl as needed for abdominal bloating.  The patient denies having any nausea, vomiting, fever, chills, hematochezia, melena, hematemesis, abdominal pain, diarrhea, jaundice, pruritus or weight loss.  Endorsed some difficulty swallowing intermittently.   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) [facility])     Past Surgical History:  Procedure Laterality Date   BIOPSY  12/05/2018   Procedure: BIOPSY;  Surgeon: Malissa Hippo, MD;  [facility];  Service: Endoscopy;;  duodenum gastric colon   BIOPSY  03/16/2022   Procedure: BIOPSY;  Surgeon: Dolores Frame, MD;  [facility];  Service: Gastroenterology;;   BREAST LUMPECTOMY Right 2017   CESAREAN SECTION  2007   COLONOSCOPY N/A 10/09/2013   Procedure: COLONOSCOPY;  Surgeon: Malissa Hippo, MD;  [facility];  Service: Endoscopy;  Laterality: N/A;  100   COLONOSCOPY N/A 12/05/2018   Procedure: COLONOSCOPY;  Surgeon: Malissa Hippo, MD;  [facility];   Service: Endoscopy;  Laterality: N/A;   COLONOSCOPY WITH PROPOFOL N/A 03/16/2022   Procedure: COLONOSCOPY WITH PROPOFOL;  Surgeon: Dolores Frame, MD;  [facility];  Service: Gastroenterology;  Laterality: N/A;  830am, asa 1-2   DILATION AND CURETTAGE OF UTERUS     x 2 for miscarriages   ESOPHAGOGASTRODUODENOSCOPY     ESOPHAGOGASTRODUODENOSCOPY N/A 12/05/2018   Procedure: ESOPHAGOGASTRODUODENOSCOPY (EGD);  Surgeon: Malissa Hippo, MD;  [facility];  Service: Endoscopy;  Laterality: N/A;  200pm   FLEXIBLE SIGMOIDOSCOPY     FLEXIBLE SIGMOIDOSCOPY  07/27/2011   Procedure: FLEXIBLE SIGMOIDOSCOPY;  Surgeon: Malissa Hippo, MD;  [facility];  Service: Endoscopy;  Laterality: N/A;  100    Family History  Problem Relation Age of Onset   Non-Hodgkin's lymphoma Mother    Hypertension Mother    Diabetes Mellitus II Mother    Hypertension Father    Diabetes Mellitus II Father    Breast cancer Cousin    Breast cancer Maternal Aunt    Colon cancer Maternal Uncle    Social History:  reports that she has never smoked. She has been exposed to tobacco smoke. She has never used smokeless tobacco. She reports that she does not drink alcohol and does not use drugs.  Allergies:  Allergies  Allergen Reactions   Ace Inhibitors Itching   Peanut Oil Itching   Shellfish Allergy Itching and Rash   Statins Itching   Adhesive [Tape] Rash   Pravastatin Sodium Itching   Pedi-Pre Tape  Spray [Wound Dressing Adhesive]    Shellfish-Derived Products     Medications Prior to Admission  Medication Sig Dispense Refill   ACCU-CHEK AVIVA PLUS test strip SMARTSIG:Via Meter 6 Times Daily     Accu-Chek Softclix Lancets lancets 4 (four) times daily.     acetaminophen (TYLENOL) 500 MG tablet Take 1,000 mg by mouth every 6 (six) hours as needed. For pain     acyclovir (ZOVIRAX) 200 MG capsule Take 400 mg by mouth daily. Reported on 07/23/2015     albuterol (PROVENTIL  HFA;VENTOLIN HFA) 108 (90 Base) MCG/ACT inhaler Inhale 2 puffs into the lungs every 4 (four) hours as needed for wheezing or shortness of breath.     atorvastatin (LIPITOR) 20 MG tablet Take 40 mg by mouth daily.     AUVELITY 45-105 MG TBCR Take by mouth in the morning and at bedtime.     Blood Glucose Monitoring Suppl (ACCU-CHEK GUIDE) w/Device KIT USE TO CHECK SUGAR     Continuous Blood Gluc Receiver (DEXCOM G6 RECEIVER) DEVI 1 Piece by Does not apply route as needed. 1 each 0   Continuous Glucose Sensor (DEXCOM G6 SENSOR) MISC USE AS DIRECTED, CHANGE SENSOR EVERY 10 DAYS 3 each 2   Continuous Glucose Transmitter (DEXCOM G6 TRANSMITTER) MISC USE AS DIRECTED, CHANGE EVERY 90 DAYS 1 each 1   dicyclomine (BENTYL) 10 MG capsule Take 1 capsule (10 mg total) by mouth 3 (three) times daily as needed for spasms. 60 capsule 1   gabapentin (NEURONTIN) 300 MG capsule Take 300-600 mg by mouth See admin instructions. Take 1 capsule (300 mg) by mouth in the morning & take 2 capsules (600 mg) by mouth at night  6   hydrochlorothiazide (HYDRODIURIL) 25 MG tablet TAKE 1 TABLET BY MOUTH EVERY DAY 90 tablet 0   INGREZZA 40 MG capsule Take 40 mg by mouth daily.     insulin lispro (HUMALOG) 100 UNIT/ML KwikPen ADMINISTER 8 TO 11 UNITS UNDER THE SKIN THREE TIMES DAILY BEFORE MEALS 15 mL 2   lamoTRIgine (LAMICTAL) 100 MG tablet Take 100 mg by mouth at bedtime.     LANTUS SOLOSTAR 100 UNIT/ML Solostar Pen Inject 20 Units into the skin at bedtime. 15 mL 2   loratadine (CLARITIN) 10 MG tablet Take 10 mg by mouth daily.   6   losartan (COZAAR) 50 MG tablet TAKE 1 TABLET BY MOUTH EVERY DAY 90 tablet 0   ondansetron (ZOFRAN-ODT) 8 MG disintegrating tablet Take 8 mg by mouth every 8 (eight) hours as needed.     pantoprazole (PROTONIX) 40 MG tablet TAKE 1 TABLET(40 MG) BY MOUTH DAILY 30 tablet 3   mesalamine (CANASA) 1000 MG suppository Place 1 suppository (1,000 mg total) rectally at bedtime. 90 suppository 1   Mesalamine  800 MG TBEC TAKE 2 TABLETS BY MOUTH IN THE MORNING, AT NOON, AND AT BEDTIME 204 tablet 4    Results for orders placed or performed during the hospital encounter of 01/24/23 (from the past 48 hours)  Glucose, capillary     Status: Abnormal   Collection Time: 01/24/23  7:09 AM  Result Value Ref Range   Glucose-Capillary 278 (H) 70 - 99 mg/dL    Comment: Glucose reference range applies only to samples taken after fasting for at least 8 hours.   No results found.  Review of Systems  Gastrointestinal:  Positive for abdominal distention.  All other systems reviewed and are negative.   Blood pressure (!) 149/83, pulse 65, temperature 98.4 F (  36.9 C), temperature source Oral, resp. rate 18, height 5\' 1"  (1.549 m), weight 86.1 kg, SpO2 (!) 17%. Physical Exam  GENERAL: The patient is AO x3, in no acute distress. HEENT: Head is normocephalic and atraumatic. EOMI are intact. Mouth is well hydrated and without lesions. NECK: Supple. No masses LUNGS: Clear to auscultation. No presence of rhonchi/wheezing/rales. Adequate chest expansion HEART: RRR, normal s1 and s2. ABDOMEN: Soft, nontender, no guarding, no peritoneal signs, and nondistended. BS +. No masses. EXTREMITIES: Without any cyanosis, clubbing, rash, lesions or edema. NEUROLOGIC: AOx3, no focal motor deficit. SKIN: no jaundice, no rashes  Assessment/Plan Georgine ADDILYN WHITMAN is a 53 y.o. female with past medical history of ulcerative proctosigmoiditis, breast cancer, GERD, gastric disease, type 1 diabetes, coming for evaluation of abdominal bloating and dysphagia. Will proceed with EGD.  Dolores Frame, MD 01/24/2023, 9:07 AM

## 2023-01-24 NOTE — Discharge Instructions (Signed)
You are being discharged to home.  We are waiting for your pathology results.  Start a low FODMAP diet - can ask for a copy at the GI clinic Take IBGard as needed for bloating.

## 2023-01-24 NOTE — Op Note (Signed)
[facility] Patient Name: Brittney [name] Procedure Date: 01/24/2023 9:02 AM MRN: [id number] Date of Birth: 09-06-1970 Attending MD: Katrinka Blazing , , [id number] CSN: [id number] Age: 53 Admit Type: Outpatient Procedure:                Upper GI endoscopy Indications:              Dysphagia, Abdominal bloating Providers:                Katrinka Blazing, Crystal Page, Lennice Sites                            Technician, Technician Referring MD:              Medicines:                Monitored Anesthesia Care Complications:            No immediate complications. Estimated Blood Loss:     Estimated blood loss: none. Procedure:                Pre-Anesthesia Assessment:                           - Prior to the procedure, a History and Physical                            was performed, and patient medications, allergies                            and sensitivities were reviewed. The patient's                            tolerance of previous anesthesia was reviewed.                           - The risks and benefits of the procedure and the                            sedation options and risks were discussed with the                            patient. All questions were answered and informed                            consent was obtained.                           - ASA Grade Assessment: II - A patient with mild                            systemic disease.                           After obtaining informed consent, the endoscope was                            passed under direct vision. Throughout the  procedure, the patient's blood pressure, pulse, and                            oxygen saturations were monitored continuously. The                            GIF-H190 ([id number]) scope was introduced through the                            mouth, and advanced to the second part of duodenum.                            The upper GI endoscopy was accomplished  without                            difficulty. The patient tolerated the procedure                            well. Scope In: 9:23:12 AM Scope Out: 9:33:38 AM Total Procedure Duration: 0 hours 10 minutes 26 seconds  Findings:      Diffuse mucosal changes characterized by ringed appearance were found in       the entire esophagus. Biopsies were obtained from the proximal and       distal esophagus with cold forceps for histology of suspected       eosinophilic esophagitis.      No endoscopic abnormality was evident in the esophagus to explain the       patient's complaint of dysphagia. It was decided, however, to proceed       with dilation of the entire esophagus. A guidewire was placed and the       scope was withdrawn. Dilation was performed with a Savary dilator with       no resistance at 18 mm. The dilation site was examined following       endoscope reinsertion and showed no change.      Multiple 4 to 10 mm sessile fundic gland polyps with no bleeding were       found in the gastric fundus and in the gastric body. Biopsies were taken       with a cold forceps for histology.      The examined duodenum was normal. Biopsies were taken with a cold       forceps for histology. Impression:               - Ringed appearance mucosa in the esophagus.                           - No endoscopic esophageal abnormality to explain                            patient's dysphagia. Esophagus dilated. Dilated.                           - Multiple fundic gland polyps. Biopsied.                           - Normal examined duodenum. Biopsied.                           -  Biopsies were taken with a cold forceps for                            evaluation of eosinophilic esophagitis. Moderate Sedation:      Per Anesthesia Care Recommendation:           - Discharge patient to home (ambulatory).                           - Await pathology results.                           - Start a low FODMAP diet - can  ask for a copy at                            the GI clinic                           - Take IBGard as needed for bloating.                           - Can check sucrase breath test if bloating                            persists. Procedure Code(s):        --- Professional ---                           [phone], Esophagogastroduodenoscopy, flexible,                            transoral; with insertion of guide wire followed by                            passage of dilator(s) through esophagus over guide                            wire                           43239, 59, Esophagogastroduodenoscopy, flexible,                            transoral; with biopsy, single or multiple Diagnosis Code(s):        --- Professional ---                           R13.10, Dysphagia, unspecified                           K31.7, Polyp of stomach and duodenum                           R14.0, Abdominal distension (gaseous) CPT copyright 2022 American [employer]. All rights reserved. The codes documented in this report are preliminary and upon coder review may  be revised to meet current compliance requirements. Katrinka Blazing, MD Katrinka Blazing,  01/24/2023 9:43:26 AM This report has been signed electronically. Number of Addenda: 0

## 2023-01-24 NOTE — Transfer of Care (Signed)
Immediate Anesthesia Transfer of Care Note  Patient: Brittney [name]  Procedure(s) Performed: ESOPHAGOGASTRODUODENOSCOPY (EGD) WITH PROPOFOL ESOPHAGEAL DILATION BIOPSY  Patient Location: Short Stay  Anesthesia Type:General  Level of Consciousness: awake, alert , oriented, and patient cooperative  Airway & Oxygen Therapy: Patient Spontanous Breathing  Post-op Assessment: Report given to RN, Post -op Vital signs reviewed and stable, and Patient moving all extremities X 4  Post vital signs: Reviewed and stable  Last Vitals:  Vitals Value Taken Time  BP 125/66 01/24/23 0938  Temp 36.5 C 01/24/23 0938  Pulse 65 01/24/23 0938  Resp 15 01/24/23 0938  SpO2 100 % 01/24/23 0938    Last Pain:  Vitals:   01/24/23 0938  TempSrc: Oral  PainSc: 0-No pain         Complications: No notable events documented.

## 2023-01-24 NOTE — Anesthesia Preprocedure Evaluation (Signed)
Anesthesia Evaluation  Patient identified by MRN, date of birth, ID band Patient awake    Reviewed: Allergy & Precautions, H&P , NPO status , Patient's Chart, lab work & pertinent test results, reviewed documented beta blocker date and time   Airway Mallampati: II  TM Distance: >3 FB Neck ROM: full    Dental no notable dental hx.    Pulmonary neg pulmonary ROS, asthma    Pulmonary exam normal breath sounds clear to auscultation       Cardiovascular Exercise Tolerance: Good hypertension, negative cardio ROS  Rhythm:regular Rate:Normal     Neuro/Psych  Headaches PSYCHIATRIC DISORDERS Anxiety Depression    negative neurological ROS  negative psych ROS   GI/Hepatic negative GI ROS, Neg liver ROS, PUD,GERD  ,,  Endo/Other  negative endocrine ROSdiabetes    Renal/GU negative Renal ROS  negative genitourinary   Musculoskeletal   Abdominal   Peds  Hematology negative hematology ROS (+) Blood dyscrasia, anemia   Anesthesia Other Findings   Reproductive/Obstetrics negative OB ROS                             Anesthesia Physical Anesthesia Plan  ASA: 2  Anesthesia Plan: General   Post-op Pain Management:    Induction:   PONV Risk Score and Plan: Propofol infusion  Airway Management Planned:   Additional Equipment:   Intra-op Plan:   Post-operative Plan:   Informed Consent: I have reviewed the patients History and Physical, chart, labs and discussed the procedure including the risks, benefits and alternatives for the proposed anesthesia with the patient or authorized representative who has indicated his/her understanding and acceptance.     Dental Advisory Given  Plan Discussed with: CRNA  Anesthesia Plan Comments:        Anesthesia Quick Evaluation

## 2023-01-25 ENCOUNTER — Encounter (INDEPENDENT_AMBULATORY_CARE_PROVIDER_SITE_OTHER): Payer: Self-pay

## 2023-01-25 ENCOUNTER — Other Ambulatory Visit: Payer: Self-pay | Admitting: Gastroenterology

## 2023-01-25 DIAGNOSIS — K219 Gastro-esophageal reflux disease without esophagitis: Secondary | ICD-10-CM

## 2023-01-25 LAB — SURGICAL PATHOLOGY

## 2023-01-26 ENCOUNTER — Other Ambulatory Visit: Payer: Self-pay | Admitting: "Endocrinology

## 2023-01-26 ENCOUNTER — Encounter (HOSPITAL_COMMUNITY): Payer: Self-pay | Admitting: Gastroenterology

## 2023-01-26 ENCOUNTER — Encounter (INDEPENDENT_AMBULATORY_CARE_PROVIDER_SITE_OTHER): Payer: Self-pay | Admitting: *Deleted

## 2023-01-27 LAB — CALPROTECTIN: Calprotectin: 72 ug/g

## 2023-01-27 NOTE — Anesthesia Postprocedure Evaluation (Signed)
Anesthesia Post Note  Patient: Nerine S Bartle  Procedure(s) Performed: ESOPHAGOGASTRODUODENOSCOPY (EGD) WITH PROPOFOL ESOPHAGEAL DILATION BIOPSY  Patient location during evaluation: Phase II Anesthesia Type: General Level of consciousness: awake Pain management: pain level controlled Vital Signs Assessment: post-procedure vital signs reviewed and stable Respiratory status: spontaneous breathing and respiratory function stable Cardiovascular status: blood pressure returned to baseline and stable Postop Assessment: no headache and no apparent nausea or vomiting Anesthetic complications: no Comments: Late entry   No notable events documented.   Last Vitals:  Vitals:   01/24/23 0716 01/24/23 0938  BP: (!) 149/83 125/66  Pulse: 65 65  Resp: 18 15  Temp: 36.9 C 36.5 C  SpO2: (!) 17% 100%    Last Pain:  Vitals:   01/24/23 0938  TempSrc: Oral  PainSc: 0-No pain                 Windell Norfolk

## 2023-02-17 ENCOUNTER — Other Ambulatory Visit (INDEPENDENT_AMBULATORY_CARE_PROVIDER_SITE_OTHER): Payer: Self-pay | Admitting: Gastroenterology

## 2023-02-20 ENCOUNTER — Other Ambulatory Visit: Payer: Self-pay | Admitting: "Endocrinology

## 2023-02-23 ENCOUNTER — Other Ambulatory Visit: Payer: Self-pay | Admitting: "Endocrinology

## 2023-03-07 ENCOUNTER — Other Ambulatory Visit (HOSPITAL_COMMUNITY): Payer: Self-pay

## 2023-03-30 ENCOUNTER — Telehealth: Payer: Self-pay | Admitting: "Endocrinology

## 2023-03-30 ENCOUNTER — Telehealth: Payer: Self-pay

## 2023-03-30 ENCOUNTER — Other Ambulatory Visit (HOSPITAL_COMMUNITY): Payer: Self-pay

## 2023-03-30 NOTE — Telephone Encounter (Signed)
 Pharmacy Patient Advocate Encounter  Received notification from [facility] that Prior Authorization for Dexcom G6 sensor has been DENIED.  See denial reason below. No denial letter attached in CMM. Will attach denial letter to Media tab once received.   PA #/Case ID/Reference #: [id number]  We may be able to approve this device when we see certain records (documentation that you have had a face-to-face encounter with your ordering practitioner no more than 3 months prior to submission of the reauthorization request to evaluate the efficacy of the continuous glucose monitoring system; documentation that you have been able to maintain or further improve glycemic control).   It looks like her last office visit was just outside the 3 month range (12/13/22) but I have never seen them be that strict. I think the more likely reason is because her A1c has been trensing up for the last 2 years, with her most recent being 8.7

## 2023-03-30 NOTE — Telephone Encounter (Signed)
 Pharmacy Patient Advocate Encounter   Received notification from [facility] [employer]s Mess[employer] that prior authorization for Dexcom G6 sensor is required/requested.   Insurance verification completed.   The patient is insured through [facility] .   Per test claim: PA required; PA submitted to above mentioned insurance via CoverMyMeds Key/confirmation #/EOC [id number] Status is pending

## 2023-03-30 NOTE — Telephone Encounter (Signed)
 Patient is checking the status of her PA for her sensor

## 2023-03-31 NOTE — Telephone Encounter (Signed)
 Left a message requesting pt return call to the office.

## 2023-04-06 ENCOUNTER — Other Ambulatory Visit: Payer: Self-pay | Admitting: "Endocrinology

## 2023-04-11 LAB — LIPID PANEL
Chol/HDL Ratio: 2.4 ratio (ref 0.0–4.4)
Cholesterol, Total: 161 mg/dL (ref 100–199)
HDL: 68 mg/dL (ref >39)
LDL Chol Calc (NIH): 83 mg/dL (ref 0–99)
Triglycerides: 47 mg/dL (ref 0–149)
VLDL Cholesterol Cal: 10 mg/dL (ref 5–40)

## 2023-04-11 LAB — COMPREHENSIVE METABOLIC PANEL WITH GFR
ALT: 17 IU/L (ref 0–32)
AST: 20 IU/L (ref 0–40)
Albumin: 4.2 g/dL (ref 3.8–4.9)
Alkaline Phosphatase: 104 IU/L (ref 44–121)
BUN/Creatinine Ratio: 15 (ref 9–23)
BUN: 11 mg/dL (ref 6–24)
Bilirubin Total: 0.6 mg/dL (ref 0.0–1.2)
CO2: 29 mmol/L (ref 20–29)
Calcium: 9.4 mg/dL (ref 8.7–10.2)
Chloride: 100 mmol/L (ref 96–106)
Creatinine, Ser: 0.72 mg/dL (ref 0.57–1.00)
Globulin, Total: 1.9 g/dL (ref 1.5–4.5)
Glucose: 283 mg/dL — ABNORMAL HIGH (ref 70–99)
Potassium: 4.3 mmol/L (ref 3.5–5.2)
Sodium: 142 mmol/L (ref 134–144)
Total Protein: 6.1 g/dL (ref 6.0–8.5)
eGFR: 101 mL/min/{1.73_m2} (ref >59)

## 2023-04-11 LAB — TSH: TSH: 1.02 u[IU]/mL (ref 0.450–4.500)

## 2023-04-11 LAB — T4, FREE: Free T4: 1.11 ng/dL (ref 0.82–1.77)

## 2023-04-13 ENCOUNTER — Ambulatory Visit: Payer: Medicaid Other | Admitting: "Endocrinology

## 2023-04-17 ENCOUNTER — Ambulatory Visit (INDEPENDENT_AMBULATORY_CARE_PROVIDER_SITE_OTHER): Payer: Medicaid Other | Admitting: Gastroenterology

## 2023-04-20 ENCOUNTER — Ambulatory Visit (INDEPENDENT_AMBULATORY_CARE_PROVIDER_SITE_OTHER): Admitting: Gastroenterology

## 2023-04-20 ENCOUNTER — Encounter (INDEPENDENT_AMBULATORY_CARE_PROVIDER_SITE_OTHER): Payer: Self-pay | Admitting: Gastroenterology

## 2023-04-20 VITALS — BP 138/83 | HR 74 | Temp 97.1°F | Ht 62.0 in | Wt 193.8 lb

## 2023-04-20 DIAGNOSIS — K219 Gastro-esophageal reflux disease without esophagitis: Secondary | ICD-10-CM

## 2023-04-20 DIAGNOSIS — R140 Abdominal distension (gaseous): Secondary | ICD-10-CM | POA: Diagnosis not present

## 2023-04-20 DIAGNOSIS — K5190 Ulcerative colitis, unspecified, without complications: Secondary | ICD-10-CM

## 2023-04-20 DIAGNOSIS — K51311 Ulcerative (chronic) rectosigmoiditis with rectal bleeding: Secondary | ICD-10-CM

## 2023-04-20 MED ORDER — FAMOTIDINE 20 MG PO TABS: 20.0000 mg | ORAL_TABLET | Freq: Every day | ORAL | 2 refills | Status: DC

## 2023-04-20 NOTE — Progress Notes (Addendum)
 Referring Provider: Richardean Chimera, MD Primary Care Physician:  Richardean Chimera, MD Primary GI Physician: Dr. Levon Hedger   Chief Complaint  Patient presents with   Follow-up    Patient here today for a follow up on UC and Gerd. Patient says she is not able to have a bm like she should and she has seen some blood on the tissue when wiping. She is currently on Mesalamine 800 mg two tablets TID, she is not currently using  the Mesalamine suppositories as she wanted to discuss this with [place] today at his visit. Genella Rife is not controlled currently on Pantoprazole 40 mg daily.    HPI:   DIARA CHAUDHARI is a 53 y.o. female with past medical history of  ulcerative proctosigmoiditis, breast cancer, GERD, gastric disease, type 1 diabetes   Patient presenting today for:  Follow up of GERD and UC  Last seen December 2024, at that time still having some bloating, occasional heartburn.  Feeling 15 September just.  Notes nausea continues.  Having 1-2 formed stools per day, fecal urgency improved since last visit.  Rare fecal incontinence.  Taking Bentyl 10 mg every other day.  No rectal bleeding, slacked off on Canasa suppositories as she felt well.  Recommend continue mesalamine 1.6 g 3 times a day, check fecal calprotectin/CRP, continue Protonix 40 mg daily, continue Bentyl 10 mg up to 3 times daily as needed, schedule EGD, repeat labs with PCP  Calprotectin was 72 in January, CRP less than 3  EGD as outlined below, advised to take IBgard for bloating, start low FODMAP diet, check sucrase breath testing if bloating persists  CMP 4/7 grossly unremarkable   Present: Still having issues with reflux maybe 2-3 times per week, sometimes depends on what she eats. Taking protonix 40mg  once daily. She is not taking anything else currently as previously was taking tums. Dysphagia has improved since EGD. Still has some bloating at times, did not try IBgard as previously recommended.   She is trying to eat  plenty of fruits, veggies and drink plenty of water, she is having a BM maybe 1-2 times per day but she notes there are days when she does not go at all. Stools formed but soft. Can go up to 2-3 days without a BM. She has tried 1 capful of miralax which seems to give her more urgency but stools are still formed. Has had some lower abdominal discomfort that tends to improve with defecation, she feels she may have more abdominal pain on days she is not defecating. She is taking bentyl PRN which seems to help with her abdominal discomfort. She has had maybe 2-3 occasions where she saw some BRB on toilet tissue that she notes was more of a swipe. She denies any pain, itching, burning in her rectal area. She denies any episodes of straining. She is only using mesalamine suppositories on occasion given bleeding is minimal    Last EGD: 01/2023- Ringed appearance mucosa in the esophagus.                           - No endoscopic esophageal abnormality to explain                            patient's dysphagia. Esophagus dilated. Dilated.                           -  Multiple fundic gland polyps. Biopsied.                           - Normal examined duodenum. Biopsied.                           - Biopsies were taken with a cold forceps for                            evaluation of eosinophilic esophagitis. Last Colonoscopy: 03/2022 - The examined portion of the ileum was normal.                           - Mild (Mayo Score 1) ulcerative colitis, improved                            since the last examination. Biopsied.                           - The distal rectum and anal verge are normal on                            retroflexion view. (Chronic inactive proctitis)   Repeat Colonoscopy 10 years Filed Weights   04/20/23 1028  Weight: 193 lb 12.8 oz (87.9 kg)     Past Medical History:  Diagnosis Date   Anxiety    Asthma    Breast cancer (HCC)    Breast cancer of upper-outer quadrant of right female breast  (HCC) 06/11/2015   Triple negative, 2 cm    Chemotherapy induced neutropenia (HCC) 09/10/2015   Depression    Diabetes mellitus (HCC)    Type 1 over 15 yrs   Environmental allergies    GERD (gastroesophageal reflux disease)    Hypertension    Iron deficiency anemia due to chronic blood loss 06/12/2015   Personal history of chemotherapy    Personal history of radiation therapy    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) [facility])     Past Surgical History:  Procedure Laterality Date   BIOPSY  12/05/2018   Procedure: BIOPSY;  Surgeon: Ruby Corporal, MD;  [facility];  Service: Endoscopy;;  duodenum gastric colon   BIOPSY  03/16/2022   Procedure: BIOPSY;  Surgeon: Urban Garden, MD;  [facility];  Service: Gastroenterology;;   BIOPSY  01/24/2023   Procedure: BIOPSY;  Surgeon: Urban Garden, MD;  [facility];  Service: Gastroenterology;;   BREAST LUMPECTOMY Right 2017   CESAREAN SECTION  2007   COLONOSCOPY N/A 10/09/2013   Procedure: COLONOSCOPY;  Surgeon: Ruby Corporal, MD;  [facility];  Service: Endoscopy;  Laterality: N/A;  100   COLONOSCOPY N/A 12/05/2018   Procedure: COLONOSCOPY;  Surgeon: Ruby Corporal, MD;  [facility];  Service: Endoscopy;  Laterality: N/A;   COLONOSCOPY WITH PROPOFOL N/A 03/16/2022   Procedure: COLONOSCOPY WITH PROPOFOL;  Surgeon: Urban Garden, MD;  [facility];  Service: Gastroenterology;  Laterality: N/A;  830am, asa 1-2   DILATION AND CURETTAGE OF UTERUS     x 2 for miscarriages   ESOPHAGEAL DILATION N/A 01/24/2023   Procedure: ESOPHAGEAL DILATION;  Surgeon:  Urban Garden, MD;  [facility];  Service: Gastroenterology;  Laterality: N/A;  9:00am;asa 2   ESOPHAGOGASTRODUODENOSCOPY     ESOPHAGOGASTRODUODENOSCOPY N/A 12/05/2018   Procedure: ESOPHAGOGASTRODUODENOSCOPY (EGD);  Surgeon: Ruby Corporal, MD;  [facility];  Service: Endoscopy;  Laterality: N/A;  200pm   ESOPHAGOGASTRODUODENOSCOPY (EGD) WITH PROPOFOL N/A 01/24/2023   Procedure: ESOPHAGOGASTRODUODENOSCOPY (EGD) WITH PROPOFOL;  Surgeon: Urban Garden, MD;  [facility];  Service: Gastroenterology;  Laterality: N/A;  9:00am;asa 2   FLEXIBLE SIGMOIDOSCOPY     FLEXIBLE SIGMOIDOSCOPY  07/27/2011   Procedure: FLEXIBLE SIGMOIDOSCOPY;  Surgeon: Ruby Corporal, MD;  [facility];  Service: Endoscopy;  Laterality: N/A;  100    Current Outpatient Medications  Medication Sig Dispense Refill   ACCU-CHEK AVIVA PLUS test strip SMARTSIG:Via Meter 6 Times Daily     Accu-Chek Softclix Lancets lancets 4 (four) times daily.     acetaminophen (TYLENOL) 500 MG tablet Take 1,000 mg by mouth every 6 (six) hours as needed. For pain     acyclovir (ZOVIRAX) 200 MG capsule Take 400 mg by mouth daily. Reported on 07/23/2015     albuterol (PROVENTIL HFA;VENTOLIN HFA) 108 (90 Base) MCG/ACT inhaler Inhale 2 puffs into the lungs every 4 (four) hours as needed for wheezing or shortness of breath.     atorvastatin (LIPITOR) 20 MG tablet Take 40 mg by mouth daily.     AUVELITY 45-105 MG TBCR Take by mouth in the morning and at bedtime.     Blood Glucose Monitoring Suppl (ACCU-CHEK GUIDE) w/Device KIT USE TO CHECK SUGAR     Continuous Blood Gluc Receiver (DEXCOM G6 RECEIVER) DEVI 1 Piece by Does not apply route as needed. 1 each 0   Continuous Glucose Sensor (DEXCOM G6 SENSOR) MISC USE AS DIRECTED, CHANGE SENSOR EVERY 10 DAYS 3 each 2   Continuous Glucose Transmitter (DEXCOM G6 TRANSMITTER) MISC USE AS DIRECTED, CHANGE EVERY 90 DAYS 1 each 1   dicyclomine (BENTYL) 10 MG capsule Take 1 capsule (10 mg total) by mouth 3 (three) times daily as needed for spasms. 60 capsule 1   gabapentin (NEURONTIN) 300 MG capsule Take 300-600 mg by mouth See admin instructions. Take 1 capsule (300 mg) by mouth in the morning & take 2 capsules (600 mg) by mouth at  night  6   hydrochlorothiazide (HYDRODIURIL) 25 MG tablet TAKE 1 TABLET BY MOUTH EVERY DAY 90 tablet 0   INGREZZA 40 MG capsule Take 40 mg by mouth daily.     insulin lispro (HUMALOG) 100 UNIT/ML KwikPen ADMINISTER 8 TO 11 UNITS UNDER THE SKIN THREE TIMES DAILY BEFORE MEALS 15 mL 2   lamoTRIgine (LAMICTAL) 100 MG tablet Take 100 mg by mouth at bedtime.     LANTUS SOLOSTAR 100 UNIT/ML Solostar Pen Inject 20 Units into the skin at bedtime. 15 mL 2   loratadine (CLARITIN) 10 MG tablet Take 10 mg by mouth daily.   6   losartan (COZAAR) 50 MG tablet TAKE 1 TABLET BY MOUTH EVERY DAY 90 tablet 0   Mesalamine 800 MG TBEC TAKE 2 TABLETS BY MOUTH IN THE MORNING, AT NOON, AND AT BEDTIME 204 tablet 4   ondansetron (ZOFRAN-ODT) 8 MG disintegrating tablet Take 8 mg by mouth every 8 (eight) hours as needed.     pantoprazole (PROTONIX) 40 MG tablet TAKE 1 TABLET(40 MG) BY MOUTH DAILY 30 tablet 3   mesalamine (CANASA) 1000 MG suppository Place 1 suppository (1,000 mg  total) rectally at bedtime. (Patient not taking: Reported on 04/20/2023) 90 suppository 1   No current facility-administered medications for this visit.   Facility-Administered Medications Ordered in Other Visits  Medication Dose Route Frequency Provider Last Rate Last Admin   0.9 %  sodium chloride infusion   Intravenous Continuous Kefalas, Maurine Minister, PA-C       0.9 %  sodium chloride infusion   Intravenous Continuous Higgs, Minta Balsam, MD   Stopped at 05/26/17 1510    Allergies as of 04/20/2023 - Review Complete 04/20/2023  Allergen Reaction Noted   Ace inhibitors Itching 06/11/2015   Peanut oil Itching 10/09/2013   Shellfish allergy Itching and Rash 10/09/2013   Statins Itching 04/09/2013   Adhesive [tape] Rash 06/25/2015   Pravastatin sodium Itching 06/11/2015   Pedi-pre tape spray [wound dressing adhesive]  07/20/2020   Shellfish-derived products  07/20/2020    Social History   Socioeconomic History   Marital status: Single    Spouse  name: Not on file   Number of children: Not on file   Years of education: Not on file   Highest education level: Not on file  Occupational History   Not on file  Tobacco Use   Smoking status: Never    Passive exposure: Current   Smokeless tobacco: Never  Vaping Use   Vaping status: Never Used  Substance and Sexual Activity   Alcohol use: No    Alcohol/week: 0.0 standard drinks of alcohol   Drug use: No   Sexual activity: Not on file  Other Topics Concern   Not on file  Social History Narrative   Not on file   Social Drivers of Health   Financial Resource Strain: Low Risk  (12/12/2019)   Overall Financial Resource Strain (CARDIA)    Difficulty of Paying Living Expenses: Not hard at all  Food Insecurity: No Food Insecurity (12/12/2019)   Hunger Vital Sign    Worried About Radiation protection practitioner of Food in the Last Year: Never true    Ran Out of Food in the Last Year: Never true  Transportation Needs: No Transportation Needs (12/12/2019)   PRAPARE - Administrator, Civil Service (Medical): No    Lack of Transportation (Non-Medical): No  Physical Activity: Inactive (12/12/2019)   Exercise Vital Sign    Days of Exercise per Week: 0 days    Minutes of Exercise per Session: 0 min  Stress: No Stress Concern Present (12/12/2019)   [employer] of Occupational Health - Occupational Stress Questionnaire    Feeling of Stress : Not at all  Social Connections: Moderately Integrated (12/12/2019)   Social Connection and Isolation Panel [NHANES]    Frequency of Communication with Friends and Family: More than three times a week    Frequency of Social Gatherings with Friends and Family: Three times a week    Attends Religious Services: 1 to 4 times per year    Active Member of Clubs or Organizations: No    Attends Engineer, structural: 1 to 4 times per year    Marital Status: Never married    Review of systems General: negative for malaise, night sweats, fever, chills,  weight loss Neck: Negative for lumps, goiter, pain and significant neck swelling Resp: Negative for cough, wheezing, dyspnea at rest CV: Negative for chest pain, leg swelling, palpitations, orthopnea GI: denies melena, hematochezia, nausea, vomiting, diarrhea, constipation, dysphagia, odyonophagia, early satiety or unintentional weight loss. +occasional toilet tissue hematochezia +GERD symptoms +bloating +lower abdominal pain  MSK: Negative for joint pain or swelling, back pain, and muscle pain. Derm: Negative for itching or rash Psych: Denies depression, anxiety, memory loss, confusion. No homicidal or suicidal ideation.  Heme: Negative for prolonged bleeding, bruising easily, and swollen nodes. Endocrine: Negative for cold or heat intolerance, polyuria, polydipsia and goiter. Neuro: negative for tremor, gait imbalance, syncope and seizures. The remainder of the review of systems is noncontributory.  Physical Exam: BP 138/83 (BP Location: Left Arm, Patient Position: Sitting, Cuff Size: Large)   Pulse 74   Temp (!) 97.1 F (36.2 C) (Temporal)   Ht 5\' 2"  (1.575 m)   Wt 193 lb 12.8 oz (87.9 kg)   BMI 35.45 kg/m  General:   Alert and oriented. No distress noted. Pleasant and cooperative.  Head:  Normocephalic and atraumatic. Eyes:  Conjuctiva clear without scleral icterus. Mouth:  Oral mucosa pink and moist. Good dentition. No lesions. Heart: Normal rate and rhythm, s1 and s2 heart sounds present.  Lungs: Clear lung sounds in all lobes. Respirations equal and unlabored. Abdomen:  +BS, soft, non-tender and non-distended. No rebound or guarding. No HSM or masses noted. Derm: No palmar erythema or jaundice Msk:  Symmetrical without gross deformities. Normal posture. Extremities:  Without edema. Neurologic:  Alert and  oriented x4 Psych:  Alert and cooperative. Normal mood and affect.  Invalid input(s): "6 MONTHS"   ASSESSMENT: MARDY HOPPE is a 53 y.o. female presenting today  for follow up of GERD and  UC  GERD: On Protonix 40 mg daily, having some reflux symptoms about 2-3 times per week.  Dysphagia is better since EGD.  She continues to have some bloating that is not driving or as previously recommended.  At this time recommend continue Protonix 40 mg daily, 32 mg nightly, she can try IBgard for bloating and to let me know if this does not improve.  UC: Colonoscopy March 2024 with mild active UC, improved from last exam.  Having softer but formed stools though notes she sometimes will skip a few days or she will not have the urge to defecate.  She denies any episodes of constipation.  Taking Bentyl 10 mg only as needed for abdominal pain which seems to help.  Abdominal pain does seem to improve with defecation that she has not.  She has occasional toilet tissue hematochezia but notes this has very small amount.  Inflammatory markers in January were within normal limits.  At this time would recommend continuing diet high in fruits, veggies, whole grains, good water intake and plenty of exercise, she can continue with Bentyl as needed and will do mesalamine 1.6 g 3 times daily with Canasa suppository as needed.  Should she have worsening of abdominal pain, rectal bleeding, harder stools or less frequency of stooling she should make me aware.   PLAN:  -continue protonix 40mg  daily -famotidine 20mg  at bedtime -Increase water intake, aim for atleast 64 oz per day -Increase fruits, veggies and whole grains, kiwi and prunes are especially good for constipation -continue mesalamine 1.6g TID -use canasa suppositories PRN -continue Bentyl PRN -pt to make me aware of worsening abdominal pain, longer stretches between BMs, worsening rectal bleeding -start IB gard for bloating   All questions were answered, patient verbalized understanding and is in agreement with plan as outlined above.   Follow Up: 4 months   Terri Malerba L. Jeanmarie Hubert, MSN, APRN, AGNP-C Adult-Gerontology Nurse  Practitioner [facility]  I have reviewed the note and agree with the  APP's assessment as described in this progress note.  Samantha Cress, MD Gastroenterology and Hepatology [facility]astroenterology

## 2023-04-20 NOTE — Patient Instructions (Addendum)
 Increase water intake, aim for atleast 64 oz per day Increase fruits, veggies and whole grains, kiwi and prunes are especially good for constipation We will continue with mesalamine 1.6g three times per day and suppositories as needed If you are having harder stools, more abdominal pain, rectal bleeding or longer stretches between bowel movements let me know, however, your inflammatory markers looked good in January For the bloating, you can try over the counter IBgard Continue protonix 40mg  daily and I will start famotidine 20mg  at bedtime to see if this helps with breakthrough symptoms  Follow up 4 months

## 2023-05-09 LAB — HEMOGLOBIN A1C: Hemoglobin A1C: 9.3

## 2023-05-09 LAB — BASIC METABOLIC PANEL WITH GFR
BUN: 15 (ref 4–21)
Creatinine: 0.9 (ref 0.5–1.1)
Potassium: 4 meq/L (ref 3.5–5.1)
Sodium: 138 (ref 137–147)

## 2023-05-09 LAB — LIPID PANEL
Cholesterol: 166 (ref 0–200)
HDL: 71 — AB (ref 35–70)
LDL Cholesterol: 85
Triglycerides: 52 (ref 40–160)

## 2023-05-09 LAB — LAB REPORT - SCANNED
A1c: 9.3
EGFR (African American): 80.3

## 2023-05-18 ENCOUNTER — Other Ambulatory Visit: Payer: Self-pay | Admitting: "Endocrinology

## 2023-05-19 ENCOUNTER — Ambulatory Visit: Admitting: "Endocrinology

## 2023-05-19 ENCOUNTER — Encounter: Payer: Self-pay | Admitting: "Endocrinology

## 2023-05-19 VITALS — BP 106/68 | HR 68 | Ht 62.0 in | Wt 192.2 lb

## 2023-05-19 DIAGNOSIS — E782 Mixed hyperlipidemia: Secondary | ICD-10-CM | POA: Diagnosis not present

## 2023-05-19 DIAGNOSIS — E66812 Obesity, class 2: Secondary | ICD-10-CM | POA: Diagnosis not present

## 2023-05-19 DIAGNOSIS — E1065 Type 1 diabetes mellitus with hyperglycemia: Secondary | ICD-10-CM

## 2023-05-19 DIAGNOSIS — Z6835 Body mass index (BMI) 35.0-35.9, adult: Secondary | ICD-10-CM

## 2023-05-19 DIAGNOSIS — Z794 Long term (current) use of insulin: Secondary | ICD-10-CM | POA: Diagnosis not present

## 2023-05-19 DIAGNOSIS — E559 Vitamin D deficiency, unspecified: Secondary | ICD-10-CM

## 2023-05-19 MED ORDER — DEXCOM G7 RECEIVER DEVI: 0 refills | Status: DC

## 2023-05-19 MED ORDER — DEXCOM G7 SENSOR MISC: 2 refills | Status: DC

## 2023-05-19 MED ORDER — LANTUS SOLOSTAR 100 UNIT/ML ~~LOC~~ SOPN: 24.0000 [IU] | PEN_INJECTOR | Freq: Every day | SUBCUTANEOUS | 2 refills | Status: DC

## 2023-05-19 NOTE — Patient Instructions (Signed)

## 2023-05-19 NOTE — Progress Notes (Signed)
 05/19/2023, 12:41 PM     Endocrinology follow-up note  Subjective:    Patient ID: Brittney [name], female    DOB: January 09, 1970.  Bhavana OLIVIA CARULLI is being seen in follow-up in the management of currently uncontrolled symptomatic type 1 diabetes, hypertension, hyperlipidemia. PMD: Leesa Pulling, MD.   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) [facility])     Past Surgical History:  Procedure Laterality Date   BIOPSY  12/05/2018   Procedure: BIOPSY;  Surgeon: Ruby Corporal, MD;  [facility];  Service: Endoscopy;;  duodenum gastric colon   BIOPSY  03/16/2022   Procedure: BIOPSY;  Surgeon: Urban Garden, MD;  [facility];  Service: Gastroenterology;;   BIOPSY  01/24/2023   Procedure: BIOPSY;  Surgeon: Urban Garden, MD;  [facility];  Service: Gastroenterology;;   BREAST LUMPECTOMY Right 2017   CESAREAN SECTION  2007   COLONOSCOPY N/A 10/09/2013   Procedure: COLONOSCOPY;  Surgeon: Ruby Corporal, MD;  [facility];  Service: Endoscopy;  Laterality: N/A;  100   COLONOSCOPY N/A 12/05/2018   Procedure: COLONOSCOPY;  Surgeon: Ruby Corporal, MD;  [facility];  Service: Endoscopy;  Laterality: N/A;   COLONOSCOPY WITH PROPOFOL  N/A 03/16/2022   Procedure: COLONOSCOPY WITH PROPOFOL ;  Surgeon:  Urban Garden, MD;  [facility];  Service: Gastroenterology;  Laterality: N/A;  830am, asa 1-2   DILATION AND CURETTAGE OF UTERUS     x 2 for miscarriages   ESOPHAGEAL DILATION N/A 01/24/2023   Procedure: ESOPHAGEAL DILATION;  Surgeon: Urban Garden, MD;  [facility];  Service: Gastroenterology;  Laterality: N/A;  9:00am;asa 2   ESOPHAGOGASTRODUODENOSCOPY     ESOPHAGOGASTRODUODENOSCOPY N/A 12/05/2018   Procedure: ESOPHAGOGASTRODUODENOSCOPY (EGD);  Surgeon: Ruby Corporal, MD;  [facility];  Service: Endoscopy;  Laterality: N/A;  200pm   ESOPHAGOGASTRODUODENOSCOPY (EGD) WITH PROPOFOL  N/A 01/24/2023   Procedure: ESOPHAGOGASTRODUODENOSCOPY (EGD) WITH PROPOFOL ;  Surgeon: Urban Garden, MD;  [facility];  Service: Gastroenterology;  Laterality: N/A;  9:00am;asa 2   FLEXIBLE SIGMOIDOSCOPY     FLEXIBLE SIGMOIDOSCOPY  07/27/2011   Procedure: FLEXIBLE SIGMOIDOSCOPY;  Surgeon: Ruby Corporal, MD;  [facility];  Service: Endoscopy;  Laterality: N/A;  100    Social History   Socioeconomic History   Marital status: Single    Spouse name: Not on file   Number of children: Not on  file   Years of education: Not on file   Highest education level: Not on file  Occupational History   Not on file  Tobacco Use   Smoking status: Never    Passive exposure: Current   Smokeless tobacco: Never  Vaping Use   Vaping status: Never Used  Substance and Sexual Activity   Alcohol use: No    Alcohol/week: 0.0 standard drinks of alcohol   Drug use: No   Sexual activity: Not on file  Other Topics Concern   Not on file  Social History Narrative   Not on file   Social Drivers of Health   Financial Resource Strain: Low Risk  (12/12/2019)   Overall Financial Resource Strain (CARDIA)    Difficulty of Paying Living Expenses: Not hard at all  Food Insecurity: No Food Insecurity (12/12/2019)   Hunger Vital Sign     Worried About Running Out of Food in the Last Year: Never true    Ran Out of Food in the Last Year: Never true  Transportation Needs: No Transportation Needs (12/12/2019)   PRAPARE - Administrator, Civil Service (Medical): No    Lack of Transportation (Non-Medical): No  Physical Activity: Inactive (12/12/2019)   Exercise Vital Sign    Days of Exercise per Week: 0 days    Minutes of Exercise per Session: 0 min  Stress: No Stress Concern Present (12/12/2019)   [employer] of Occupational Health - Occupational Stress Questionnaire    Feeling of Stress : Not at all  Social Connections: Moderately Integrated (12/12/2019)   Social Connection and Isolation Panel [NHANES]    Frequency of Communication with Friends and Family: More than three times a week    Frequency of Social Gatherings with Friends and Family: Three times a week    Attends Religious Services: 1 to 4 times per year    Active Member of Clubs or Organizations: No    Attends Engineer, structural: 1 to 4 times per year    Marital Status: Never married    Family History  Problem Relation Age of Onset   Non-Hodgkin's lymphoma Mother    Hypertension Mother    Diabetes Mellitus II Mother    Hypertension Father    Diabetes Mellitus II Father    Breast cancer Cousin    Breast cancer Maternal Aunt    Colon cancer Maternal Uncle     Outpatient Encounter Medications as of 05/19/2023  Medication Sig   Continuous Glucose Receiver (DEXCOM G7 RECEIVER) DEVI Use to monitor BG continuously   Continuous Glucose Sensor (DEXCOM G7 SENSOR) MISC Change sensor every 10 days   ACCU-CHEK AVIVA PLUS test strip SMARTSIG:Via Meter 6 Times Daily   Accu-Chek Softclix Lancets lancets 4 (four) times daily.   acetaminophen  (TYLENOL ) 500 MG tablet Take 1,000 mg by mouth every 6 (six) hours as needed. For pain   acyclovir  (ZOVIRAX ) 200 MG capsule Take 400 mg by mouth daily. Reported on 07/23/2015   albuterol (PROVENTIL  HFA;VENTOLIN HFA) 108 (90 Base) MCG/ACT inhaler Inhale 2 puffs into the lungs every 4 (four) hours as needed for wheezing or shortness of breath.   atorvastatin (LIPITOR) 20 MG tablet Take 40 mg by mouth daily.   AUVELITY 45-105 MG TBCR Take by mouth in the morning and at bedtime.   Blood Glucose Monitoring Suppl (ACCU-CHEK GUIDE) w/Device KIT USE TO CHECK SUGAR   dicyclomine  (BENTYL ) 10 MG capsule Take 1 capsule (10 mg total) by mouth 3 (three) times  daily as needed for spasms.   famotidine  (PEPCID ) 20 MG tablet Take 1 tablet (20 mg total) by mouth at bedtime.   gabapentin  (NEURONTIN ) 300 MG capsule Take 300-600 mg by mouth See admin instructions. Take 1 capsule (300 mg) by mouth in the morning & take 2 capsules (600 mg) by mouth at night   hydrochlorothiazide  (HYDRODIURIL ) 25 MG tablet TAKE 1 TABLET BY MOUTH EVERY DAY   INGREZZA 40 MG capsule Take 40 mg by mouth daily.   insulin  lispro (HUMALOG ) 100 UNIT/ML KwikPen ADMINISTER 8 TO 11 UNITS UNDER THE SKIN THREE TIMES DAILY BEFORE MEALS   lamoTRIgine (LAMICTAL) 100 MG tablet Take 100 mg by mouth at bedtime.   LANTUS  SOLOSTAR 100 UNIT/ML Solostar Pen Inject 24 Units into the skin at bedtime.   loratadine (CLARITIN) 10 MG tablet Take 10 mg by mouth daily.    losartan  (COZAAR ) 50 MG tablet TAKE 1 TABLET BY MOUTH EVERY DAY   mesalamine  (CANASA ) 1000 MG suppository Place 1 suppository (1,000 mg total) rectally at bedtime. (Patient not taking: Reported on 04/20/2023)   Mesalamine  800 MG TBEC TAKE 2 TABLETS BY MOUTH IN THE MORNING, AT NOON, AND AT BEDTIME   ondansetron  (ZOFRAN -ODT) 8 MG disintegrating tablet Take 8 mg by mouth every 8 (eight) hours as needed.   pantoprazole  (PROTONIX ) 40 MG tablet TAKE 1 TABLET(40 MG) BY MOUTH DAILY   [DISCONTINUED] Continuous Blood Gluc Receiver (DEXCOM G6 RECEIVER) DEVI 1 Piece by Does not apply route as needed.   [DISCONTINUED] Continuous Glucose Sensor (DEXCOM G6 SENSOR) MISC USE AS DIRECTED, CHANGE SENSOR EVERY 10  DAYS   [DISCONTINUED] Continuous Glucose Transmitter (DEXCOM G6 TRANSMITTER) MISC USE AS DIRECTED, CHANGE EVERY 90 DAYS   [DISCONTINUED] LANTUS  SOLOSTAR 100 UNIT/ML Solostar Pen Inject 20 Units into the skin at bedtime.   Facility-Administered Encounter Medications as of 05/19/2023  Medication   0.9 %  sodium chloride  infusion   0.9 %  sodium chloride  infusion    ALLERGIES: Allergies  Allergen Reactions   Ace Inhibitors Itching   Peanut Oil Itching   Shellfish Allergy Itching and Rash   Statins Itching   Adhesive [Tape] Rash   Pravastatin Sodium Itching   Pedi-Pre Tape Spray [Wound Dressing Adhesive]    Shellfish-Derived Products     VACCINATION STATUS: Immunization History  Administered Date(s) Administered   Influenza,inj,Quad PF,6+ Mos 10/22/2015   Influenza-Unspecified 10/16/2019   Pneumococcal Polysaccharide-23 01/03/2006   Td 10/04/2006   Tdap 07/10/1996    Diabetes She presents for her follow-up diabetic visit. She has type 1 diabetes mellitus. Onset time: She was diagnosed at approximate age of 17 years. Her disease course has been worsening. Pertinent negatives for hypoglycemia include no confusion, nervousness/anxiousness, pallor or seizures. Associated symptoms include fatigue, polydipsia and polyuria. Pertinent negatives for diabetes include no chest pain and no polyphagia. There are no hypoglycemic complications. Symptoms are worsening. There are no diabetic complications. Risk factors for coronary artery disease include diabetes mellitus, dyslipidemia, obesity and sedentary lifestyle. Current diabetic treatment includes insulin  injections. Her weight is fluctuating minimally. She is following a generally unhealthy diet. When asked about meal planning, she reported none. She has not had a previous visit with a dietitian. Her home blood glucose trend is increasing steadily. Her breakfast blood glucose range is generally >200 mg/dl. Her lunch blood glucose range is  generally >200 mg/dl. Her dinner blood glucose range is generally >200 mg/dl. Her bedtime blood glucose range is generally >200 mg/dl. Her overall blood glucose range is >200  mg/dl. (Ms. Weinand presents with loss of control of her glycemic profile with previsit labs showing A1c of 9.3%.  Her Dexcom overview shows 25% time in range, 26% level 1 hyperglycemia, 48% level 2 hyperglycemia.  She does not have significant hypoglycemia.  Her average blood glucose is 244 mg per DL for the last 14 days.  Patient did not call clinic for hyperglycemia despite recommendations to do so.     ) An ACE inhibitor/angiotensin II receptor blocker is not being taken.  Hyperlipidemia This is a chronic problem. The current episode started more than 1 year ago. The problem is controlled. Pertinent negatives include no chest pain, myalgias or shortness of breath. Current antihyperlipidemic treatment includes statins (She was treated with atorvastatin in the past, did not continue on this medication complaining about cost.). Risk factors for coronary artery disease include diabetes mellitus, dyslipidemia, obesity and family history.    Review of systems  Constitutional: + Minimally fluctuating body weight,  current  Body mass index is 35.15 kg/m. , no fatigue, no subjective hyperthermia, no subjective hypothermia    Objective:    BP 106/68   Pulse 68   Ht 5\' 2"  (1.575 m)   Wt 192 lb 3.2 oz (87.2 kg)   BMI 35.15 kg/m   Wt Readings from Last 3 Encounters:  05/19/23 192 lb 3.2 oz (87.2 kg)  04/20/23 193 lb 12.8 oz (87.9 kg)  01/24/23 189 lb 13.1 oz (86.1 kg)     Physical Exam- Limited  Constitutional:  Body mass index is 35.15 kg/m. , not in acute distress, normal state of mind   Recent Results (from the past 2160 hours)  Comprehensive metabolic panel     Status: Abnormal   Collection Time: 04/10/23 10:29 AM  Result Value Ref Range   Glucose 283 (H) 70 - 99 mg/dL   BUN 11 6 - 24 mg/dL   Creatinine, Ser  1.47 0.57 - 1.00 mg/dL   eGFR 829 >56 OZ/HYQ/6.57   BUN/Creatinine Ratio 15 9 - 23   Sodium 142 134 - 144 mmol/L   Potassium 4.3 3.5 - 5.2 mmol/L   Chloride 100 96 - 106 mmol/L   CO2 29 20 - 29 mmol/L   Calcium 9.4 8.7 - 10.2 mg/dL   Total Protein 6.1 6.0 - 8.5 g/dL   Albumin 4.2 3.8 - 4.9 g/dL   Globulin, Total 1.9 1.5 - 4.5 g/dL   Bilirubin Total 0.6 0.0 - 1.2 mg/dL   Alkaline Phosphatase 104 44 - 121 IU/L   AST 20 0 - 40 IU/L   ALT 17 0 - 32 IU/L  Lipid panel     Status: None   Collection Time: 04/10/23 10:29 AM  Result Value Ref Range   Cholesterol, Total 161 100 - 199 mg/dL   Triglycerides 47 0 - 149 mg/dL   HDL 68 >84 mg/dL   VLDL Cholesterol Cal 10 5 - 40 mg/dL   LDL Chol Calc (NIH) 83 0 - 99 mg/dL   Chol/HDL Ratio 2.4 0.0 - 4.4 ratio    Comment:                                   T. Chol/HDL Ratio  Men  Women                               1/2 Avg.Risk  3.4    3.3                                   Avg.Risk  5.0    4.4                                2X Avg.Risk  9.6    7.1                                3X Avg.Risk 23.4   11.0   TSH     Status: None   Collection Time: 04/10/23 10:29 AM  Result Value Ref Range   TSH 1.020 0.450 - 4.500 uIU/mL  T4, free     Status: None   Collection Time: 04/10/23 10:29 AM  Result Value Ref Range   Free T4 1.11 0.82 - 1.77 ng/dL  Lab report - scanned     Status: None   Collection Time: 05/09/23 12:00 AM  Result Value Ref Range   EGFR (African American) 80.3     Comment: ABST BY HIM   A1c 9.30   Hemoglobin A1c     Status: None   Collection Time: 05/09/23 12:00 AM  Result Value Ref Range   Hemoglobin A1C 9.3   Basic metabolic panel with GFR     Status: None   Collection Time: 05/09/23 12:00 AM  Result Value Ref Range   BUN 15 4 - 21   Creatinine 0.9 0.5 - 1.1   Potassium 4.0 3.5 - 5.1 mEq/L   Sodium 138 137 - 147  Lipid panel     Status: Abnormal   Collection Time: 05/09/23 12:00 AM   Result Value Ref Range   Triglycerides 52 40 - 160   Cholesterol 166 0 - 200   HDL 71 (A) 35 - 70   LDL Cholesterol 85      Diabetic Labs (most recent): Lab Results  Component Value Date   HGBA1C 9.3 05/09/2023   HGBA1C 8.7 (A) 12/08/2022   HGBA1C 8.2 (A) 07/20/2022   MICROALBUR 80 06/09/2020   MICROALBUR 2.7 12/02/2019   MICROALBUR 2.3 05/23/2018     Lipid Panel ( most recent) Lipid Panel     Component Value Date/Time   CHOL 166 05/09/2023 0000   CHOL 161 04/10/2023 1029   TRIG 52 05/09/2023 0000   HDL 71 (A) 05/09/2023 0000   HDL 68 04/10/2023 1029   CHOLHDL 2.4 04/10/2023 1029   CHOLHDL 2.3 08/29/2019 0942   LDLCALC 85 05/09/2023 0000   LDLCALC 83 04/10/2023 1029   LDLCALC 73 08/29/2019 0942      Latest Ref Rng & Units 05/09/2023   12:00 AM 04/10/2023   10:29 AM 08/01/2022    9:52 AM  CMP  Glucose 70 - 99 mg/dL  213  086   BUN 4 - 21 15     11  12    Creatinine 0.5 - 1.1 0.9     0.72  0.74   Sodium 137 - 147 138     142  135   Potassium 3.5 - 5.1  mEq/L 4.0     4.3  4.1   Chloride 96 - 106 mmol/L  100  100   CO2 20 - 29 mmol/L  29  31   Calcium 8.7 - 10.2 mg/dL  9.4  8.7   Total Protein 6.0 - 8.5 g/dL  6.1  6.4   Total Bilirubin 0.0 - 1.2 mg/dL  0.6  0.9   Alkaline Phos 44 - 121 IU/L  104  93   AST 0 - 40 IU/L  20  18   ALT 0 - 32 IU/L  17  18      This result is from an external source.     Assessment & Plan:   1. Uncontrolled type 1 diabetes mellitus with hyperglycemia (HCC)  - Aylinn S Pattan has currently uncontrolled symptomatic type 2 DM since 53 years of age.  Ms. Lesko presents with loss of control of her glycemic profile with previsit labs showing A1c of 9.3%.  Her Dexcom overview shows 25% time in range, 26% level 1 hyperglycemia, 48% level 2 hyperglycemia.  She does not have significant hypoglycemia.  Her average blood glucose is 244 mg per DL for the last 14 days.  Patient did not call clinic for hyperglycemia despite recommendations to do  so.       Her recent anti-GAD antibody elevated at 61 suggesting type 1 diabetes.   -her diabetes is complicated by obesity/sedentary life and she remains at a high risk for more acute and chronic complications which include CAD, CVA, CKD, retinopathy, and neuropathy. These are all discussed in detail with her.  - I have counseled her on diet management and weight loss, by adopting a carbohydrate restricted/protein rich diet.   - She did not engage optimally, however she acknowledges that there is a room for improvement in her food and drink choices. - Suggestion is made for her to avoid simple carbohydrates  from her diet including Cakes, Sweet Desserts, Ice Cream, Soda (diet and regular), Sweet Tea, Candies, Chips, Cookies, Store Bought Juices, Alcohol , Artificial Sweeteners,  Coffee Creamer, and "Sugar-free" Products, Lemonade. This will help patient to have more stable blood glucose profile and potentially avoid unintended weight gain.  The following Lifestyle Medicine recommendations according to American College of Lifestyle Medicine  [facility]) were discussed and and offered to patient and she  agrees to start the journey:  A. Whole Foods, Plant-Based Nutrition comprising of fruits and vegetables, plant-based proteins, whole-grain carbohydrates was discussed in detail with the patient.   A list for source of those nutrients were also provided to the patient.  Patient will use only water  or unsweetened tea for hydration. B.  The need to stay away from risky substances including alcohol, smoking; obtaining 7 to 9 hours of restorative sleep, at least 150 minutes of moderate intensity exercise weekly, the importance of healthy social connections,  and stress management techniques were discussed. C.  A full color page of  Calorie density of various food groups per pound showing examples of each food groups was provided to the patient.  - I encouraged her to switch to  unprocessed or minimally  processed complex starch and increased protein intake (mostly plant source), fruits, and vegetables.  - she is advised to stick to a routine mealtimes to eat 3 meals  a day and avoid unnecessary snacks ( to snack only to correct hypoglycemia).    - I have approached her with the following individualized plan to manage diabetes and patient agrees:   -  She will continue to need intensive treatment with basal/bolus insulin  in order for her to achieve and maintain control of diabetes to target.     -Based on her presentation with significant hyperglycemia and history of hypoglycemia unawareness, her insulin  will be adjusted carefully.  She is encouraged to continue to utilize her CGM, will order Dexcom G7 for her.    -She is advised to increase Lantus  to 24 units nightly, continue  Humalog  at 8-11 units for pre-meal blood glucose readings above 80 mg/day.  He is allowed to use tight scale  sliding scale Humalog  for  hyperglycemia above 150 mg per DL.  She is advised to continue monitoring blood glucose 4 times a day-before meals and at bedtime. - she is encouraged to call clinic for blood glucose levels less than 70 or above 200 mg /dl. - she is warned not to take insulin  without proper monitoring per orders. - Adjustment parameters are given to her for hypo and hyperglycemia in writing.  - she is not a suitable candidate  for metformin, SGLT2 inhibitors, nor incretin therapy.   - Patient specific target  A1c;  LDL, HDL, Triglycerides, were discussed in detail.  2) Blood Pressure /Hypertension:  -Her blood pressure is controlled to target.  She has urine microalbuminuria.  She is currently  on losartan  50 mg daily.  I discussed and added hydrochlorothiazide  25 mg p.o. daily at breakfast.   3) Lipids/Hyperlipidemia: Review of her most recent lipid panel showed controlled LDL at 84.6, overall improvement from 113.     She is advised to continue atorvastatin 20 mg p.o. nightly.  Side effects and  precautions discussed with her.        4)  Weight/Diet: Her BMI is 35.15--  clearly complicating her diabetes care.  A candidate for modest weight loss, may avoid further weight gain by avoiding unnecessary snacks.  I discussed with her the fact that loss of 5 - 10% of her  current body weight will have the most impact on her diabetes management.   CDE Consult will be initiated . Exercise, and detailed carbohydrates information provided  -  detailed on discharge instructions.  5) Chronic Care/Health Maintenance:  -she  Is not on ACEI/ARB and Statin medications and  is encouraged to initiate and continue to follow up with Ophthalmology, Dentist,  Podiatrist at least yearly or according to recommendations, and advised to   stay away from smoking. I have recommended yearly flu vaccine and pneumonia vaccine at least every 5 years; moderate intensity exercise for up to 150 minutes weekly; and  sleep for at least 7 hours a day.  - she is  advised to maintain close follow up with Leesa Pulling, MD for primary care needs, as well as her other providers for optimal and coordinated care.   I spent  41  minutes in the care of the patient today including review of labs from CMP, Lipids, Thyroid Function, Hematology (current and previous including abstractions from other facilities); face-to-face time discussing  her blood glucose readings/logs, discussing hypoglycemia and hyperglycemia episodes and symptoms, medications doses, her options of short and long term treatment based on the latest standards of care / guidelines;  discussion about incorporating lifestyle medicine;  and documenting the encounter. Risk reduction counseling performed per USPSTF guidelines to reduce  obesity and cardiovascular risk factors.     Please refer to Patient Instructions for Blood Glucose Monitoring and Insulin /Medications Dosing Guide"  in media tab for additional information. Please  also refer to " Patient Self Inventory" in  the Media  tab for reviewed elements of pertinent patient history.  Albertha Alosa participated in the discussions, expressed understanding, and voiced agreement with the above plans.  All questions were answered to her satisfaction. she is encouraged to contact clinic should she have any questions or concerns prior to her return visit.    Follow up plan: - Return in about 4 months (around 09/19/2023) for Bring Meter/CGM Device/Logs- A1c in Office.  Kalvin Orf, MD [facility] [address] [place], [place] [address] Phone: [phone]  Fax: [phone]    05/19/2023, 12:41 PM  This note was partially dictated with voice recognition software. Similar sounding words can be transcribed inadequately or may not  be corrected upon review.

## 2023-05-23 ENCOUNTER — Telehealth: Payer: Self-pay

## 2023-05-23 NOTE — Telephone Encounter (Signed)
 Pharmacy Patient Advocate Encounter   Received notification from CoverMyMeds that prior authorization for Dexcom G7 sensor is required/requested.   Insurance verification completed.   The patient is insured through [facility] .   Per test claim: PA required and submitted KEY/EOC/Request #: [id number] from 05/23/23 to 11/19/2023  PA Case ID #: [id number]

## 2023-05-24 ENCOUNTER — Other Ambulatory Visit: Payer: Self-pay | Admitting: "Endocrinology

## 2023-05-24 NOTE — Telephone Encounter (Signed)
Left a message making pt aware. 

## 2023-05-25 ENCOUNTER — Telehealth: Payer: Self-pay | Admitting: "Endocrinology

## 2023-05-25 ENCOUNTER — Telehealth: Payer: Self-pay

## 2023-05-25 NOTE — Telephone Encounter (Signed)
 Pt is stating PA is required for Dexcom G7 Receiver.  Says sensors were approved

## 2023-05-25 NOTE — Telephone Encounter (Signed)
 Pt is calling asking why this was denied. She was approved for her sensors. Please advise

## 2023-05-25 NOTE — Telephone Encounter (Signed)
 Pharmacy Patient Advocate Encounter   Received notification from Pt Calls Messages that prior authorization for Dexcom G7 receiver is required/requested.   Insurance verification completed.   The patient is insured through [facility] .   Per test claim: PA required; PA started via CoverMyMeds. KEY [id number] . Waiting for clinical questions to populate.

## 2023-05-28 ENCOUNTER — Other Ambulatory Visit (INDEPENDENT_AMBULATORY_CARE_PROVIDER_SITE_OTHER): Payer: Self-pay | Admitting: Gastroenterology

## 2023-05-28 DIAGNOSIS — K5130 Ulcerative (chronic) rectosigmoiditis without complications: Secondary | ICD-10-CM

## 2023-05-28 DIAGNOSIS — K51919 Ulcerative colitis, unspecified with unspecified complications: Secondary | ICD-10-CM

## 2023-05-28 DIAGNOSIS — K5120 Ulcerative (chronic) proctitis without complications: Secondary | ICD-10-CM

## 2023-05-28 DIAGNOSIS — K5190 Ulcerative colitis, unspecified, without complications: Secondary | ICD-10-CM

## 2023-05-30 ENCOUNTER — Telehealth (INDEPENDENT_AMBULATORY_CARE_PROVIDER_SITE_OTHER): Payer: Self-pay

## 2023-05-30 ENCOUNTER — Other Ambulatory Visit: Payer: Self-pay | Admitting: "Endocrinology

## 2023-05-30 ENCOUNTER — Other Ambulatory Visit (INDEPENDENT_AMBULATORY_CARE_PROVIDER_SITE_OTHER): Payer: Self-pay | Admitting: Gastroenterology

## 2023-05-30 MED ORDER — BUDESONIDE 3 MG PO CPEP: ORAL_CAPSULE | ORAL | 0 refills | Status: AC

## 2023-05-30 NOTE — Telephone Encounter (Signed)
 Patient called on Friday May 26, 2023, left a Vm that she is having issues with her UC. I spoke with the patient today 05/29/2025. Patient says she did not seek treatment else where. She says she has been having issues with Mid to lower abdominal pain mostly after a Bm. When she has a Bm she says she has mucus and blood when she wipes. The patient denies any fever, has chills at times and says she is taking Mesalamine  800 mg two in am, two at noon, and two at bed time. She is no longer able to use the mesalamine  suppositories as this has caused some rectal irritation. Patient uses [facility]. Please advise.

## 2023-05-30 NOTE — Telephone Encounter (Signed)
 I spoke with the patient and made her aware per Joie Narrow, Suspect she is likely having a flare of her UC, I will send a budesonide taper x12 weeks, she should let me  know if she has new symptoms or if her current symptoms fail to improve.  She has Follow up in August, would ideally like to see her in here in about 4-6 weeks if possible to see how she's doing, can be with me or DR. C   Patient made aware of all and aware, someone will be giving her a called to arrange a sooner appointment.

## 2023-05-30 NOTE — Telephone Encounter (Signed)
 Addressed i

## 2023-05-30 NOTE — Telephone Encounter (Signed)
 I spoke with the patient she says she is not having any issues with Diarrhea, and she has not been on any recent antibiotics.

## 2023-05-30 NOTE — Telephone Encounter (Signed)
 Addressed in new encounter.

## 2023-06-01 ENCOUNTER — Telehealth (INDEPENDENT_AMBULATORY_CARE_PROVIDER_SITE_OTHER): Payer: Self-pay | Admitting: Gastroenterology

## 2023-06-01 ENCOUNTER — Ambulatory Visit (INDEPENDENT_AMBULATORY_CARE_PROVIDER_SITE_OTHER): Admitting: Gastroenterology

## 2023-06-01 ENCOUNTER — Encounter (INDEPENDENT_AMBULATORY_CARE_PROVIDER_SITE_OTHER): Payer: Self-pay | Admitting: Gastroenterology

## 2023-06-01 VITALS — BP 121/75 | HR 62 | Temp 98.1°F | Ht 62.0 in | Wt 196.8 lb

## 2023-06-01 DIAGNOSIS — K51311 Ulcerative (chronic) rectosigmoiditis with rectal bleeding: Secondary | ICD-10-CM

## 2023-06-01 DIAGNOSIS — K5900 Constipation, unspecified: Secondary | ICD-10-CM

## 2023-06-01 DIAGNOSIS — R1030 Lower abdominal pain, unspecified: Secondary | ICD-10-CM

## 2023-06-01 NOTE — Progress Notes (Addendum)
 Referring Provider: Leesa Pulling, MD Primary Care Physician:  Leesa Pulling, MD Primary GI Physician: Dr. Sammi Crick   Chief Complaint  Patient presents with   Follow-up    Pt arrives for follow up. Pt states for the past 2-3 weeks she has noticed blood and mucus when wiping. Also has had abdominal pain in lower area of abdomen with slight nausea. Has been using suppositories but they caused slight irritation which became uncomfortable.    HPI:   Brittney [name] is a 53 y.o. female with past medical history of  ulcerative proctosigmoiditis, breast cancer, GERD, gastric disease, type 1 diabetes   Patient presenting today for:  Follow up of UC with suspected flare  Last seen in April, at that time having reflux 2-3 times per week, dysphagia improved since EGD. Having a BM 1-2 times per day, stools soft and formed, can go up to 2-3 days without a BM at times, lower abdominal discomfort. Reported 2-3 occasions where she saw some BRB on toilet tissue, using mesalamine  suppositories on occasion  Recommended to continue protonix  40mg  daily, famotidine  20mg  at bedtime, increase water , fiber, continue mesalamine  1.6g TID, canasa  suppository PRN, continue bentyl  PRN, start IB gard for bloating  Patient called on 5/27 reporting some abdominal pain, mucus and blood in stools, denied any diarrhea, fevers or any new medications. Budesonide  taper was sent in for suspected UC flare  Present:  Patient states in late April she was havign some constipation, she took miralax  and then had a BM which caused her some pain. She notes she had a subsequent very large BM later that was followed with blood and some mucus in her stools. She does not have any diarrhea. She notes some abdominal discomfort now after her BMs. She has some nausea as well but no vomiting. She notes blood is just when she wipes, none in the toilet. She is still feeling constipated, some days has no BMs at all. Not taking anything for  constipation currently. Denies any pain in her rectal area.  No fevers or chills. She has not started budesonide  yet. She notes pain is mostly in lower abdomen, some more on LLQ a few days ago but this resolved. She did use canasa  suppositories but felt these irritated her rectal area so she stopped them.    Last EGD: 01/2023- Ringed appearance mucosa in the esophagus.                           - No endoscopic esophageal abnormality to explain                            patient's dysphagia. Esophagus dilated. Dilated.                           - Multiple fundic gland polyps. Biopsied.                           - Normal examined duodenum. Biopsied.                           - Biopsies were taken with a cold forceps for                            evaluation  of eosinophilic esophagitis. Last Colonoscopy: 03/2022 - The examined portion of the ileum was normal.                           - Mild (Mayo Score 1) ulcerative colitis, improved                            since the last examination. Biopsied.                           - The distal rectum and anal verge are normal on                            retroflexion view. (Chronic inactive proctitis)   Repeat Colonoscopy 10 years Filed Weights   06/01/23 1121  Weight: 196 lb 12.8 oz (89.3 kg)     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) [facility])     Past Surgical History:  Procedure Laterality Date   BIOPSY   12/05/2018   Procedure: BIOPSY;  Surgeon: Ruby Corporal, MD;  [facility];  Service: Endoscopy;;  duodenum gastric colon   BIOPSY  03/16/2022   Procedure: BIOPSY;  Surgeon: Urban Garden, MD;  [facility];  Service: Gastroenterology;;   BIOPSY  01/24/2023   Procedure: BIOPSY;  Surgeon: Urban Garden, MD;  [facility];  Service: Gastroenterology;;   BREAST LUMPECTOMY Right 2017   CESAREAN SECTION  2007   COLONOSCOPY N/A 10/09/2013   Procedure: COLONOSCOPY;  Surgeon: Ruby Corporal, MD;  [facility];  Service: Endoscopy;  Laterality: N/A;  100   COLONOSCOPY N/A 12/05/2018   Procedure: COLONOSCOPY;  Surgeon: Ruby Corporal, MD;  [facility];  Service: Endoscopy;  Laterality: N/A;   COLONOSCOPY WITH PROPOFOL  N/A 03/16/2022   Procedure: COLONOSCOPY WITH PROPOFOL ;  Surgeon: Urban Garden, MD;  [facility];  Service: Gastroenterology;  Laterality: N/A;  830am, asa 1-2   DILATION AND CURETTAGE OF UTERUS     x 2 for miscarriages   ESOPHAGEAL DILATION N/A 01/24/2023   Procedure: ESOPHAGEAL DILATION;  Surgeon: Urban Garden, MD;  [facility];  Service: Gastroenterology;  Laterality: N/A;  9:00am;asa 2   ESOPHAGOGASTRODUODENOSCOPY     ESOPHAGOGASTRODUODENOSCOPY N/A 12/05/2018   Procedure: ESOPHAGOGASTRODUODENOSCOPY (EGD);  Surgeon: Ruby Corporal, MD;  [facility];  Service: Endoscopy;  Laterality: N/A;  200pm   ESOPHAGOGASTRODUODENOSCOPY (EGD) WITH PROPOFOL  N/A 01/24/2023   Procedure: ESOPHAGOGASTRODUODENOSCOPY (EGD) WITH PROPOFOL ;  Surgeon: Urban Garden, MD;  [facility];  Service: Gastroenterology;  Laterality: N/A;  9:00am;asa 2   FLEXIBLE SIGMOIDOSCOPY     FLEXIBLE SIGMOIDOSCOPY  07/27/2011  Procedure: FLEXIBLE SIGMOIDOSCOPY;  Surgeon: Ruby Corporal, MD;  [facility];  Service: Endoscopy;  Laterality: N/A;  100     Current Outpatient Medications  Medication Sig Dispense Refill   ACCU-CHEK AVIVA PLUS test strip SMARTSIG:Via Meter 6 Times Daily     Accu-Chek Softclix Lancets lancets 4 (four) times daily.     acetaminophen  (TYLENOL ) 500 MG tablet Take 1,000 mg by mouth every 6 (six) hours as needed. For pain     acyclovir  (ZOVIRAX ) 200 MG capsule Take 400 mg by mouth daily. Reported on 07/23/2015     albuterol (PROVENTIL HFA;VENTOLIN HFA) 108 (90 Base) MCG/ACT inhaler Inhale 2 puffs into the lungs every 4 (four) hours as needed for wheezing or shortness of breath.     atorvastatin (LIPITOR) 20 MG tablet Take 40 mg by mouth daily.     AUVELITY 45-105 MG TBCR Take by mouth in the morning and at bedtime.     Blood Glucose Monitoring Suppl (ACCU-CHEK GUIDE) w/Device KIT USE TO CHECK SUGAR     budesonide  (ENTOCORT EC ) 3 MG 24 hr capsule Take 3 capsules (9 mg total) by mouth daily for 28 days, THEN 2 capsules (6 mg total) daily for 28 days, THEN 1 capsule (3 mg total) daily for 28 days. 168 capsule 0   Continuous Glucose Receiver (DEXCOM G7 RECEIVER) DEVI Use to monitor BG continuously 1 each 0   Continuous Glucose Sensor (DEXCOM G7 SENSOR) MISC Change sensor every 10 days 3 each 2   dicyclomine  (BENTYL ) 10 MG capsule Take 1 capsule (10 mg total) by mouth 3 (three) times daily as needed for spasms. 60 capsule 1   famotidine  (PEPCID ) 20 MG tablet Take 1 tablet (20 mg total) by mouth at bedtime. 60 tablet 2   gabapentin  (NEURONTIN ) 300 MG capsule Take 300-600 mg by mouth See admin instructions. Take 1 capsule (300 mg) by mouth in the morning & take 2 capsules (600 mg) by mouth at night  6   hydrochlorothiazide  (HYDRODIURIL ) 25 MG tablet TAKE 1 TABLET BY MOUTH EVERY DAY 90 tablet 0   INGREZZA 40 MG capsule Take 40 mg by mouth daily.     insulin  lispro (HUMALOG ) 100 UNIT/ML KwikPen ADMINISTER 8 TO 11 UNITS UNDER THE SKIN THREE TIMES DAILY BEFORE MEALS 15 mL 2   lamoTRIgine (LAMICTAL) 100 MG tablet Take 100 mg by  mouth at bedtime.     LANTUS  SOLOSTAR 100 UNIT/ML Solostar Pen Inject 24 Units into the skin at bedtime. 15 mL 2   loratadine (CLARITIN) 10 MG tablet Take 10 mg by mouth daily.   6   losartan  (COZAAR ) 50 MG tablet TAKE 1 TABLET BY MOUTH EVERY DAY 90 tablet 0   mesalamine  (CANASA ) 1000 MG suppository Place 1 suppository (1,000 mg total) rectally at bedtime. 90 suppository 1   Mesalamine  800 MG TBEC TAKE 2 TABLETS BY MOUTH IN THE MORNING, AT NOON, AND AT BEDTIME 180 tablet 5   ondansetron  (ZOFRAN -ODT) 8 MG disintegrating tablet Take 8 mg by mouth every 8 (eight) hours as needed.     pantoprazole  (PROTONIX ) 40 MG tablet TAKE 1 TABLET(40 MG) BY MOUTH DAILY 30 tablet 3   No current facility-administered medications for this visit.   Facility-Administered Medications Ordered in Other Visits  Medication Dose Route Frequency Provider Last Rate Last Admin   0.9 %  sodium chloride  infusion   Intravenous Continuous Kefalas, Thomas S, PA-C       0.9 %  sodium chloride  infusion  Intravenous Continuous Higgs, Katheryne Pane, MD   Stopped at 05/26/17 1510    Allergies as of 06/01/2023 - Review Complete 06/01/2023  Allergen Reaction Noted   Ace inhibitors Itching 06/11/2015   Peanut oil Itching 10/09/2013   Shellfish allergy Itching and Rash 10/09/2013   Statins Itching 04/09/2013   Adhesive [tape] Rash 06/25/2015   Pravastatin sodium Itching 06/11/2015   Pedi-pre tape spray [wound dressing adhesive]  07/20/2020   Shellfish-derived products  07/20/2020    Social History   Socioeconomic History   Marital status: Single    Spouse name: Not on file   Number of children: Not on file   Years of education: Not on file   Highest education level: Not on file  Occupational History   Not on file  Tobacco Use   Smoking status: Never    Passive exposure: Current   Smokeless tobacco: Never  Vaping Use   Vaping status: Never Used  Substance and Sexual Activity   Alcohol use: No    Alcohol/week: 0.0 standard  drinks of alcohol   Drug use: No   Sexual activity: Not on file  Other Topics Concern   Not on file  Social History Narrative   Not on file   Social Drivers of Health   Financial Resource Strain: Low Risk  (12/12/2019)   Overall Financial Resource Strain (CARDIA)    Difficulty of Paying Living Expenses: Not hard at all  Food Insecurity: No Food Insecurity (12/12/2019)   Hunger Vital Sign    Worried About Radiation protection practitioner of Food in the Last Year: Never true    Ran Out of Food in the Last Year: Never true  Transportation Needs: No Transportation Needs (12/12/2019)   PRAPARE - Administrator, Civil Service (Medical): No    Lack of Transportation (Non-Medical): No  Physical Activity: Inactive (12/12/2019)   Exercise Vital Sign    Days of Exercise per Week: 0 days    Minutes of Exercise per Session: 0 min  Stress: No Stress Concern Present (12/12/2019)   [employer] of Occupational Health - Occupational Stress Questionnaire    Feeling of Stress : Not at all  Social Connections: Moderately Integrated (12/12/2019)   Social Connection and Isolation Panel [NHANES]    Frequency of Communication with Friends and Family: More than three times a week    Frequency of Social Gatherings with Friends and Family: Three times a week    Attends Religious Services: 1 to 4 times per year    Active Member of Clubs or Organizations: No    Attends Engineer, structural: 1 to 4 times per year    Marital Status: Never married    Review of systems General: negative for malaise, night sweats, fever, chills, weight loss Neck: Negative for lumps, goiter, pain and significant neck swelling Resp: Negative for cough, wheezing, dyspnea at rest CV: Negative for chest pain, leg swelling, palpitations, orthopnea GI: denies melena, nausea, vomiting, diarrhea, dysphagia, odyonophagia, early satiety or unintentional weight loss. +rectal bleeding +constipation +mucus in stools  MSK: Negative for  joint pain or swelling, back pain, and muscle pain. Derm: Negative for itching or rash Psych: Denies depression, anxiety, memory loss, confusion. No homicidal or suicidal ideation.  Heme: Negative for prolonged bleeding, bruising easily, and swollen nodes. Endocrine: Negative for cold or heat intolerance, polyuria, polydipsia and goiter. Neuro: negative for tremor, gait imbalance, syncope and seizures. The remainder of the review of systems is noncontributory.  Physical Exam: BP 121/75  Pulse 62   Temp 98.1 F (36.7 C)   Ht 5\' 2"  (1.575 m)   Wt 196 lb 12.8 oz (89.3 kg)   BMI 36.00 kg/m  General:   Alert and oriented. No distress noted. Pleasant and cooperative.  Head:  Normocephalic and atraumatic. Eyes:  Conjuctiva clear without scleral icterus. Mouth:  Oral mucosa pink and moist. Good dentition. No lesions. Heart: Normal rate and rhythm, s1 and s2 heart sounds present.  Lungs: Clear lung sounds in all lobes. Respirations equal and unlabored. Abdomen:  +BS, soft, and non-distended. Abdomen is non tender on exam. No rebound or guarding. No HSM or masses noted. Derm: No palmar erythema or jaundice Msk:  Symmetrical without gross deformities. Normal posture. Extremities:  Without edema. Neurologic:  Alert and  oriented x4 Psych:  Alert and cooperative. Normal mood and affect.  Invalid input(s): "6 MONTHS"   ASSESSMENT: KAE LAUMAN is a 53 y.o. female presenting today for ulcerative proctosigmoiditis with rectal bleeding, mucus in stools and constipation  Recently with more constipation, rectal bleeding, mucus in stools and lower abdominal pain. No diarrhea. She is maintained on mesalamine  1.6g TID. Most recent colonoscopy as above with chronic inactive proctitis. She used canasa  suppositories but felt these made her rectal area feel more irritated, she denies any rectal pain. I suspect at this time we are dealing with a flare of her UC. Reassuringly her abdominal exam today  is benign. Will check inflammatory markers and recommend starting budesonide  taper. If she has worsening of symptoms or fails to improve with steroids, she should make me aware.    PLAN:  -CRP, fecal calprotectin -recommended to start budesonide  taper -continue mesalamine  1.6g TID For now  All questions were answered, patient verbalized understanding and is in agreement with plan as outlined above.    Follow Up: 4-6 weeks   Jaclene Bartelt L. Adrien Alberta, MSN, APRN, AGNP-C Adult-Gerontology Nurse Practitioner [facility]  I have reviewed the note and agree with the APP's assessment as described in this progress note  Samantha Cress, MD Gastroenterology and Hepatology [facility]

## 2023-06-01 NOTE — Patient Instructions (Addendum)
 We will check inflammatory markers today I suspect you are having a flare of your UC We will start the budesonide taper as prescribed If symptoms are not improving, please make me aware  Follow up 6-8 weeks

## 2023-06-01 NOTE — Telephone Encounter (Signed)
 Pt seen in office today and prescribed budesonide. Pt is taking Auvelity also for depression. Pt states printout from pharmacist states this may cause interaction. Pt wants to know if budesonide is safe to take with Auvelity. Please advise. Thank you.

## 2023-06-01 NOTE — Telephone Encounter (Signed)
 Pt contacted and verbalized understanding.

## 2023-06-05 ENCOUNTER — Other Ambulatory Visit: Payer: Self-pay

## 2023-06-05 DIAGNOSIS — E1065 Type 1 diabetes mellitus with hyperglycemia: Secondary | ICD-10-CM

## 2023-06-05 MED ORDER — DEXCOM G6 TRANSMITTER MISC: 1 refills | Status: AC

## 2023-06-07 NOTE — Telephone Encounter (Signed)
 Clinical info has been submitted

## 2023-06-12 NOTE — Telephone Encounter (Signed)
 Pharmacy Patient Advocate Encounter  Received notification from [facility] that Prior Authorization for Dexcom G7 receiver has been DENIED.  Full denial letter will be uploaded to the media tab. See denial reason below.  We see that this request is for a device called Dexcom G7 receiver  (quantity 1 per 90 days) for your illness (insulin  dependent diabetes). This device is not a  benefit on your plan when exceeding quantity limits. However, it may be covered by another  payor   PA #/Case ID/Reference #: [id number]

## 2023-06-19 NOTE — Telephone Encounter (Signed)
 Patient says she is taking both the Protonix  and famotidine  daily. She has enough Zofran  on hand at the moment and will let us  know in the future if she needs more. She states understanding this some times happen while taking budesonide  and may get better once she decreases the dose.    I spoke with the patient and made her aware per [facility], Sometimes the budesonide  can cause some stomach upset/discomfort. Is she still taking her protonix  and famotidine  both? She can use zofran  as needed, I am happy to provide a refill if needed. These symptoms may improve once she decreases to 2 tablets daily.

## 2023-06-19 NOTE — Telephone Encounter (Signed)
 Patient called today 06/19/2023 stating that she has been having some mid abdominal discomfort and nausea since starting on the Budesonide . She is currently taking three per day and will decrease this to two per day next week . She does have some Zofran  on hand at home. Patient uses [employer].Please advise.

## 2023-06-21 ENCOUNTER — Other Ambulatory Visit (INDEPENDENT_AMBULATORY_CARE_PROVIDER_SITE_OTHER): Payer: Self-pay | Admitting: Gastroenterology

## 2023-06-27 ENCOUNTER — Other Ambulatory Visit (INDEPENDENT_AMBULATORY_CARE_PROVIDER_SITE_OTHER): Payer: Self-pay | Admitting: Gastroenterology

## 2023-07-19 ENCOUNTER — Telehealth (INDEPENDENT_AMBULATORY_CARE_PROVIDER_SITE_OTHER): Payer: Self-pay

## 2023-07-19 NOTE — Telephone Encounter (Signed)
 Patient called today states she is still not feeling well, still seeing mucus and blood in stools, nauseated. She is currently on Budesonide  6 mg per day. Please advise. Thanks

## 2023-07-20 NOTE — Telephone Encounter (Signed)
 I spoke with the patient and she says when she first started the budesonide  it started out good,but when she was weaning herself down is when the symptoms started.

## 2023-07-20 NOTE — Telephone Encounter (Signed)
 I spoke with the patient and made her aware per Mitzie Boettcher NP, Let's have her go back to the 9mg  per day dosing for now as she felt better on this. Looks like she has an appt on Monday with Dr. Eartha, will await further recommendations after she is seen by him in the clinic Patient states understanding.

## 2023-07-21 ENCOUNTER — Other Ambulatory Visit: Payer: Self-pay

## 2023-07-21 ENCOUNTER — Encounter: Payer: Self-pay | Admitting: Allergy & Immunology

## 2023-07-21 ENCOUNTER — Ambulatory Visit: Admitting: Allergy & Immunology

## 2023-07-21 VITALS — BP 134/90 | HR 73 | Temp 97.8°F | Resp 18 | Ht 61.42 in | Wt 198.6 lb

## 2023-07-21 DIAGNOSIS — L2089 Other atopic dermatitis: Secondary | ICD-10-CM | POA: Diagnosis not present

## 2023-07-21 DIAGNOSIS — J310 Chronic rhinitis: Secondary | ICD-10-CM | POA: Diagnosis not present

## 2023-07-21 DIAGNOSIS — J4530 Mild persistent asthma, uncomplicated: Secondary | ICD-10-CM | POA: Diagnosis not present

## 2023-07-21 NOTE — Patient Instructions (Addendum)
 1. Chronic rhinitis - Because of insurance stipulations, we cannot do skin testing on the same day as your first visit. - We are all working to fight this, but for now we need to do two separate visits.  - We will know more after we do testing at the next visit.  - The skin testing visit can be squeezed in at your convenience.  - Then we can make a more full plan to address all of your symptoms. - Be sure to stop your antihistamines for 3 days before this appointment (this includes Claritin).  2. Mild persistent asthma, uncomplicated - Lung testing looks pretty good.  - Spacer sample and teaching provided.  - We are going to add on a daily medication called Symbicort in the morning to control your shortness of breath   - Symbicort contains a long-acting albuterol combined with an inhaled steroid. - This **should** help with your shortness of breath throughout the day. - Try to pay attention to your shortness of breath episodes throughout the day.   3. Flexural atopic dermatitis - Continue with the triamcinolone twice daily as you are doing. - Continue with the moisturizing as you are doing.   4. Food allergies (shellfish and peanut and sunflower) - We will do testing to shellfish at the next visit.  - We will do testing to peanuts at the next visit.  - We will have to do blood work to sunflower at the next vist (since we do not have that for skin testing).  - We will defer on EpiPen until the testing is done.   5. Itching with intermittent hives (hives improved) - We are going to see what the skin testing shows before going on to do any labs.  - We may get some additional labs once we do the skin testing.  - Continue with the moisturizing as you are doing.   6. Return in about 2 weeks (around 08/04/2023) for SKIN TESTING (1-55 + PN + shellfish). You can have the follow up appointment with Dr. Iva or a Nurse Practicioner (our Nurse Practitioners are excellent and always have  Physician oversight!).    Please inform us  of any Emergency Department visits, hospitalizations, or changes in symptoms. Call us  before going to the ED for breathing or allergy symptoms since we might be able to fit you in for a sick visit. Feel free to contact us  anytime with any questions, problems, or concerns.  It was a pleasure to meet you today!  Websites that have reliable patient information: 1. American Academy of Asthma, Allergy, and Immunology: www.aaaai.org 2. Food Allergy Research and Education (FARE): foodallergy.org 3. Mothers of Asthmatics: http://www.asthmacommunitynetwork.org 4. American College of Allergy, Asthma, and Immunology: www.acaai.org      "Like" us  on Facebook and Instagram for our latest updates!      A healthy democracy works best when [employer] participate! Make sure you are registered to vote! If you have moved or changed any of your contact information, you will need to get this updated before voting! Scan the QR codes below to learn more!

## 2023-07-21 NOTE — Progress Notes (Signed)
 NEW PATIENT  Date of Service/Encounter:  07/21/23  Consult requested by: Toribio Jerel MATSU, MD   Assessment:   Chronic rhinitis - planning for skin testing at the next visit  Mild persistent asthma, uncomplicated  Flexural atopic dermatitis  Breast cancer (diagnosed in 2017) - treated with lumpectomy, radiation and chemotherapy  Ulcerative colitis (diagnosed in 2007) - on mesalamine  daily and Enterocort for flares  Plan/Recommendations:   1. Chronic rhinitis - Because of insurance stipulations, we cannot do skin testing on the same day as your first visit. - We are all working to fight this, but for now we need to do two separate visits.  - We will know more after we do testing at the next visit.  - The skin testing visit can be squeezed in at your convenience.  - Then we can make a more full plan to address all of your symptoms. - Be sure to stop your antihistamines for 3 days before this appointment (this includes Claritin).  2. Mild persistent asthma, uncomplicated - Lung testing looks pretty good.  - Spacer sample and teaching provided.  - We are going to add on a daily medication called Symbicort in the morning to control your shortness of breath   - Symbicort contains a long-acting albuterol combined with an inhaled steroid. - This **should** help with your shortness of breath throughout the day. - Try to pay attention to your shortness of breath episodes throughout the day.   3. Flexural atopic dermatitis - Continue with the triamcinolone twice daily as you are doing. - Continue with the moisturizing as you are doing.   4. Food allergies (shellfish and peanut and sunflower) - We will do testing to shellfish at the next visit.  - We will do testing to peanuts at the next visit.  - We will have to do blood work to sunflower at the next vist (since we do not have that for skin testing).  - We will defer on EpiPen until the testing is done.   5. Itching with  intermittent hives (hives improved) - We are going to see what the skin testing shows before going on to do any labs.  - We may get some additional labs once we do the skin testing.  - Continue with the moisturizing as you are doing.   6. Return in about 2 weeks (around 08/04/2023) for SKIN TESTING (1-55 + PN + shellfish). You can have the follow up appointment with Dr. Iva or a Nurse Practicioner (our Nurse Practitioners are excellent and always have Physician oversight!).   This note in its entirety was forwarded to the Provider who requested this consultation.  Subjective:   Brittney [name] is a 53 y.o. female presenting today for evaluation of  Chief Complaint  Patient presents with   Establish Care    Patient presents to the office to establish care for allergies. Patient reports mainly seasonal but has been more than that. Pt reports severe itching by ingesting sunflower oil/ butter. ACT 25    Brittney [name] has a history of the following: Patient Active Problem List   Diagnosis Date Noted   Esophageal dysphagia 01/24/2023   Insulin  long-term use (HCC) 07/20/2022   Class 1 obesity due to excess calories with serious comorbidity and body mass index (BMI) of 31.0 to 31.9 in adult 07/20/2022   Belching 06/01/2022   Rectal bleeding 03/18/2022   Bloating 12/13/2021   Vitamin D  deficiency 06/22/2021   Class 2 severe obesity  due to excess calories with serious comorbidity and body mass index (BMI) of 35.0 to 35.9 in adult [facility]) 06/22/2021   Gastroesophageal reflux disease 10/15/2018   Uncontrolled type 1 diabetes mellitus with hyperglycemia (HCC) 06/04/2018   Uncontrolled type 2 diabetes mellitus with hyperglycemia (HCC) 03/21/2018   Neutropenic fever (HCC) 09/11/2015   Chemotherapy induced neutropenia (HCC) 09/10/2015   Nausea without vomiting 07/09/2015   Genetic counseling and testing 06/29/2015   Iron deficiency anemia due to chronic blood loss 06/12/2015    Malignant neoplasm of upper-outer quadrant of right female breast (HCC) 06/11/2015   Bilateral cataracts 05/12/2015   Type 2 diabetes mellitus without complication (HCC) 05/12/2015   Diabetic gastroparesis (HCC) 08/26/2013   THYROID NODULE, RIGHT 04/17/2008   Ulcerative colitis (HCC) 11/01/2007   Type 1 diabetes mellitus (HCC) 10/11/2007   Mixed hyperlipidemia 10/11/2007   ANEMIA-NOS 10/11/2007   Depression 10/11/2007   GERD 10/11/2007   Peptic ulcer 10/11/2007   Headache 10/11/2007   History of colonic polyps 10/11/2007   Personal history of urinary disorder 10/11/2007   Personal history presenting hazards to health 10/11/2007    History obtained from: chart review and patient.  Discussed the use of AI scribe software for clinical note transcription with the patient and/or guardian, who gave verbal consent to proceed.  Brittney [name] was referred by Toribio Jerel MATSU, MD.     Brittney [name] is a 53 y.o. female presenting for an evaluation of pruritus, asthma, and food G's.  Asthma/Respiratory Symptom History: She has asthma, diagnosed in her mid-forties, and uses albuterol as needed. She experiences shortness of breath with exertion, such as walking from her apartment to the garbage, but does not require hospitalization for these symptoms. She denies daily symptoms but acknowledges that they are present intermittently. She reports sleep disturbances but does not wake up coughing.   Allergic Rhinitis Symptom History: She does have some allergic rhinitis symptoms.  Symptoms seem to get worse in the spring.  However, she has symptoms throughout the year.  Food Allergy Symptom History: She has a history of food allergies, including shellfish and peanuts, which cause severe itching, and has recently developed a similar reaction to sunflower butter. No throat closure or vomiting with her food allergies.   Skin Symptom History: Her eczema is managed with a cream, likely triamcinolone, which  she uses primarily in the winter when symptoms are more pronounced. She has not had recent dermatology consultations for her eczema, with the last visit over seventeen years ago.   She experiences significant itching throughout the year, with exacerbations during the spring and summer months. The itching is severe and affects her entire body, with some areas more affected than others. She has a history of hives, which have since resolved. No fevers associated with her itching but experiences a tickle in the back of her throat, which has improved but persists.   Infection Symptom History: No frequent sinus infections, ear infections, or pneumonia.  She has a history of breast cancer diagnosed in 2017, treated with lumpectomy, chemotherapy, and radiation.  She also has ulcerative colitis, diagnosed in 2007, for which she takes mesalamine  regularly and uses Enterocort during flare-ups.  Otherwise, there is no history of other atopic diseases, including drug allergies, stinging insect allergies, or contact dermatitis. There is no significant infectious history. Vaccinations are up to date.    Past Medical History: Patient Active Problem List   Diagnosis Date Noted   Esophageal dysphagia 01/24/2023   Insulin  long-term use (HCC) 07/20/2022  Class 1 obesity due to excess calories with serious comorbidity and body mass index (BMI) of 31.0 to 31.9 in adult 07/20/2022   Belching 06/01/2022   Rectal bleeding 03/18/2022   Bloating 12/13/2021   Vitamin D  deficiency 06/22/2021   Class 2 severe obesity due to excess calories with serious comorbidity and body mass index (BMI) of 35.0 to 35.9 in adult (HCC) 06/22/2021   Gastroesophageal reflux disease 10/15/2018   Uncontrolled type 1 diabetes mellitus with hyperglycemia (HCC) 06/04/2018   Uncontrolled type 2 diabetes mellitus with hyperglycemia (HCC) 03/21/2018   Neutropenic fever (HCC) 09/11/2015   Chemotherapy induced neutropenia (HCC) 09/10/2015    Nausea without vomiting 07/09/2015   Genetic counseling and testing 06/29/2015   Iron deficiency anemia due to chronic blood loss 06/12/2015   Malignant neoplasm of upper-outer quadrant of right female breast (HCC) 06/11/2015   Bilateral cataracts 05/12/2015   Type 2 diabetes mellitus without complication (HCC) 05/12/2015   Diabetic gastroparesis (HCC) 08/26/2013   THYROID NODULE, RIGHT 04/17/2008   Ulcerative colitis (HCC) 11/01/2007   Type 1 diabetes mellitus (HCC) 10/11/2007   Mixed hyperlipidemia 10/11/2007   ANEMIA-NOS 10/11/2007   Depression 10/11/2007   GERD 10/11/2007   Peptic ulcer 10/11/2007   Headache 10/11/2007   History of colonic polyps 10/11/2007   Personal history of urinary disorder 10/11/2007   Personal history presenting hazards to health 10/11/2007    Medication List:  Allergies as of 07/21/2023       Reactions   Ace Inhibitors Itching   Peanut Oil Itching   Shellfish Allergy Itching, Rash   Statins Itching   Adhesive [tape] Rash   Pravastatin Sodium Itching   Pedi-pre Tape Spray [wound Dressing Adhesive]    Shellfish-derived Products         Medication List        Accurate as of July 21, 2023 12:39 PM. If you have any questions, ask your nurse or doctor.          STOP taking these medications    Dexcom G7 Sensor Misc Stopped by: Marty Morton Shaggy       TAKE these medications    Accu-Chek Aviva Plus test strip Generic drug: glucose blood SMARTSIG:Via Meter 6 Times Daily   Accu-Chek Guide w/Device Kit USE TO CHECK SUGAR   Accu-Chek Softclix Lancets lancets 4 (four) times daily.   acetaminophen  500 MG tablet Commonly known as: TYLENOL  Take 1,000 mg by mouth every 6 (six) hours as needed. For pain   acyclovir  200 MG capsule Commonly known as: ZOVIRAX  Take 400 mg by mouth daily. Reported on 07/23/2015   albuterol 108 (90 Base) MCG/ACT inhaler Commonly known as: VENTOLIN HFA Inhale 2 puffs into the lungs every 4 (four) hours  as needed for wheezing or shortness of breath.   atorvastatin 20 MG tablet Commonly known as: LIPITOR Take 40 mg by mouth daily.   Auvelity 45-105 MG Tbcr Generic drug: Dextromethorphan-buPROPion ER Take by mouth in the morning and at bedtime.   budesonide  3 MG 24 hr capsule Commonly known as: ENTOCORT EC  Take 3 capsules (9 mg total) by mouth daily for 28 days, THEN 2 capsules (6 mg total) daily for 28 days, THEN 1 capsule (3 mg total) daily for 28 days. Start taking on: May 30, 2023   busPIRone 10 MG tablet Commonly known as: BUSPAR Take 10 mg by mouth 3 (three) times daily as needed.   Dexcom G6 Transmitter Misc Use to monitor glucose continuously as instructed. Change transmitter every  90 days   Dexcom G7 Receiver Devi Use to monitor BG continuously   dicyclomine  10 MG capsule Commonly known as: BENTYL  Take 1 capsule (10 mg total) by mouth 3 (three) times daily as needed for spasms.   famotidine  20 MG tablet Commonly known as: Pepcid  Take 1 tablet (20 mg total) by mouth at bedtime.   gabapentin  300 MG capsule Commonly known as: NEURONTIN  Take 300-600 mg by mouth See admin instructions. Take 1 capsule (300 mg) by mouth in the morning & take 2 capsules (600 mg) by mouth at night   hydrochlorothiazide  25 MG tablet Commonly known as: HYDRODIURIL  TAKE 1 TABLET BY MOUTH EVERY DAY   Ingrezza 40 MG capsule Generic drug: valbenazine Take 40 mg by mouth daily.   insulin  lispro 100 UNIT/ML KwikPen Commonly known as: HUMALOG  ADMINISTER 8 TO 11 UNITS UNDER THE SKIN THREE TIMES DAILY BEFORE MEALS   lamoTRIgine 100 MG tablet Commonly known as: LAMICTAL Take 100 mg by mouth at bedtime.   Lantus  SoloStar 100 UNIT/ML Solostar Pen Generic drug: insulin  glargine Inject 24 Units into the skin at bedtime.   loratadine 10 MG tablet Commonly known as: CLARITIN Take 10 mg by mouth daily.   losartan  50 MG tablet Commonly known as: COZAAR  TAKE 1 TABLET BY MOUTH EVERY DAY    mesalamine  1000 MG suppository Commonly known as: CANASA  Place 1 suppository (1,000 mg total) rectally at bedtime.   Mesalamine  800 MG Tbec TAKE 2 TABLETS BY MOUTH IN THE MORNING, AT NOON, AND AT BEDTIME   ondansetron  8 MG disintegrating tablet Commonly known as: ZOFRAN -ODT Take 8 mg by mouth every 8 (eight) hours as needed.   pantoprazole  40 MG tablet Commonly known as: PROTONIX  TAKE 1 TABLET(40 MG) BY MOUTH DAILY   triamcinolone cream 0.1 % Commonly known as: KENALOG Apply 1 Application topically 2 (two) times daily.        Birth History: non-contributory  Developmental History: non-contributory  Past Surgical History: Past Surgical History:  Procedure Laterality Date   BIOPSY  12/05/2018   Procedure: BIOPSY;  Surgeon: Golda Claudis PENNER, MD;  [facility];  Service: Endoscopy;;  duodenum gastric colon   BIOPSY  03/16/2022   Procedure: BIOPSY;  Surgeon: Eartha Angelia Sieving, MD;  [facility]: [facility];  Service: Gastroenterology;;   BIOPSY  01/24/2023   Procedure: BIOPSY;  Surgeon: Eartha Angelia Sieving, MD;  [facility]: [facility];  Service: Gastroenterology;;   BREAST LUMPECTOMY Right 2017   CESAREAN SECTION  2007   COLONOSCOPY N/A 10/09/2013   Procedure: COLONOSCOPY;  Surgeon: Claudis PENNER Golda, MD;  [facility]: [facility];  Service: Endoscopy;  Laterality: N/A;  100   COLONOSCOPY N/A 12/05/2018   Procedure: COLONOSCOPY;  Surgeon: Golda Claudis PENNER, MD;  [facility]: [facility];  Service: Endoscopy;  Laterality: N/A;   COLONOSCOPY WITH PROPOFOL  N/A 03/16/2022   Procedure: COLONOSCOPY WITH PROPOFOL ;  Surgeon: Eartha Angelia Sieving, MD;  [facility];  Service: Gastroenterology;  Laterality: N/A;  830am, asa 1-2   DILATION AND CURETTAGE OF UTERUS     x 2 for miscarriages   ESOPHAGEAL DILATION N/A 01/24/2023   Procedure: ESOPHAGEAL DILATION;  Surgeon: Eartha Angelia Sieving, MD;  [facility];  Service: Gastroenterology;   Laterality: N/A;  9:00am;asa 2   ESOPHAGOGASTRODUODENOSCOPY     ESOPHAGOGASTRODUODENOSCOPY N/A 12/05/2018   Procedure: ESOPHAGOGASTRODUODENOSCOPY (EGD);  Surgeon: Golda Claudis PENNER, MD;  [facility];  Service: Endoscopy;  Laterality: N/A;  200pm  ESOPHAGOGASTRODUODENOSCOPY (EGD) WITH PROPOFOL  N/A 01/24/2023   Procedure: ESOPHAGOGASTRODUODENOSCOPY (EGD) WITH PROPOFOL ;  Surgeon: Eartha Angelia Sieving, MD;  [facility];  Service: Gastroenterology;  Laterality: N/A;  9:00am;asa 2   FLEXIBLE SIGMOIDOSCOPY     FLEXIBLE SIGMOIDOSCOPY  07/27/2011   Procedure: FLEXIBLE SIGMOIDOSCOPY;  Surgeon: Claudis RAYMOND Rivet, MD;  [facility]: [facility];  Service: Endoscopy;  Laterality: N/A;  100     Family History: Family History  Problem Relation Age of Onset   Eczema Mother    Non-Hodgkin's lymphoma Mother    Hypertension Mother    Diabetes Mellitus II Mother    Allergic rhinitis Father    Hypertension Father    Diabetes Mellitus II Father    Breast cancer Maternal Aunt    Colon cancer Maternal Uncle    Breast cancer Cousin      Social History: Ezelle lives at home with her family. 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 She has a daughter who is 78 years old.  She lives in an apartment of unknown age.  There is wood flooring throughout the home.  They have gas heating and central cooling.  She also has window units as well as a fan.  There are cats inside of the home.  There are no dust mite covers on the bedding.  There is tobacco exposure in the form of cigarettes, although she does not smoke herself.  There is no fume, chemical, or dust exposure.  There is no HEPA filter.  She does not live near an interstate or industrial area.  She is a primary caregiver of her mom who has dementia as well as her father who is developing dementia.  She also adopted her 2  nieces because her sister passed away.  Her brother was in a car accident and lost his home, so he is living with her as well.   Review of systems otherwise negative other than that mentioned in the HPI.    Objective:   Blood pressure (!) 134/90, pulse 73, temperature 97.8 F (36.6 C), temperature source Temporal, resp. rate 18, height 5' 1.42 (1.56 m), weight 198 lb 9.6 oz (90.1 kg), SpO2 98%. Body mass index is 37.02 kg/m.     Physical Exam Constitutional:      Appearance: She is well-developed.  HENT:     Head: Normocephalic and atraumatic.     Right Ear: Tympanic membrane, ear canal and external ear normal. No drainage, swelling or tenderness. Tympanic membrane is not injected, scarred, erythematous, retracted or bulging.     Left Ear: Tympanic membrane, ear canal and external ear normal. No drainage, swelling or tenderness. Tympanic membrane is not injected, scarred, erythematous, retracted or bulging.     Nose: No nasal deformity, septal deviation, mucosal edema or rhinorrhea.     Right Sinus: No maxillary sinus tenderness or frontal sinus tenderness.     Left Sinus: No maxillary sinus tenderness or frontal sinus tenderness.     Mouth/Throat:     Mouth: Mucous membranes are not pale and not dry.     Pharynx: Uvula midline.  Eyes:     General:        Right eye: No discharge.        Left eye: No discharge.     Conjunctiva/sclera: Conjunctivae normal.     Right eye: Right conjunctiva is not injected. No chemosis.  Left eye: Left conjunctiva is not injected. No chemosis.    Pupils: Pupils are equal, round, and reactive to light.  Cardiovascular:     Rate and Rhythm: Normal rate and regular rhythm.     Heart sounds: Normal heart sounds.  Pulmonary:     Effort: Pulmonary effort is normal. No tachypnea, accessory muscle usage or respiratory distress.     Breath sounds: Normal breath sounds. No wheezing, rhonchi or rales.  Chest:     Chest wall: No tenderness.   Abdominal:     Tenderness: There is no abdominal tenderness. There is no guarding or rebound.  Lymphadenopathy:     Head:     Right side of head: No submandibular, tonsillar or occipital adenopathy.     Left side of head: No submandibular, tonsillar or occipital adenopathy.     Cervical: No cervical adenopathy.  Skin:    Coloration: Skin is not pale.     Findings: No abrasion, erythema, petechiae or rash. Rash is not papular, urticarial or vesicular.  Neurological:     Mental Status: She is alert.      Diagnostic studies:    Spirometry: results normal (FEV1: 1.55/73%, FVC: 2.16/82%, FEV1/FVC: 72%).     Spirometry consistent with normal pattern.   Allergy Studies: deferred due to insurance stipulations that require a separate visit for testing        Marty Shaggy, MD [facility] 

## 2023-07-22 LAB — C-REACTIVE PROTEIN: CRP: <3 mg/L (ref <8.0)

## 2023-07-24 ENCOUNTER — Encounter (INDEPENDENT_AMBULATORY_CARE_PROVIDER_SITE_OTHER): Payer: Self-pay | Admitting: Gastroenterology

## 2023-07-24 ENCOUNTER — Ambulatory Visit (INDEPENDENT_AMBULATORY_CARE_PROVIDER_SITE_OTHER): Admitting: Gastroenterology

## 2023-07-24 VITALS — BP 133/65 | HR 78 | Temp 97.1°F | Ht 61.0 in | Wt 199.6 lb

## 2023-07-24 DIAGNOSIS — K51311 Ulcerative (chronic) rectosigmoiditis with rectal bleeding: Secondary | ICD-10-CM

## 2023-07-24 DIAGNOSIS — K51318 Ulcerative (chronic) rectosigmoiditis with other complication: Secondary | ICD-10-CM

## 2023-07-24 DIAGNOSIS — K625 Hemorrhage of anus and rectum: Secondary | ICD-10-CM

## 2023-07-24 NOTE — Progress Notes (Unsigned)
 Toribio Fortune, M.D. Gastroenterology & Hepatology [facility] [address] [place], [place] [address]  Primary Care Physician: Toribio Jerel MATSU, MD [address] [place] [address]  I will communicate my assessment and recommendations to the referring MD via EMR.  Problems: Ulcerative proctosigmoiditis  History of Present Illness: Brittney [name] is a 53 y.o. female with past medical history of  ulcerative proctosigmoiditis, breast cancer, GERD, gastric disease, type 1 diabetes, coming for follow-up of ulcerative colitis.  The patient was last seen on 06/01/2023. At that time, the patient the patient had CRP and fecal calprotectin ordered and was started on a budesonide  taper.  Also advised to continue mesalamine  1.6 g 3 times daily.  Had labs ordered. CRP was negative.  Calprotectin was not performed.  Patient was started on budesonide  taper due to persistent symptoms.  Has not noticed any difference while on budesonide .  She had previously used mesalamine  suppositories with relief, but recently she could not tolerate using these given pain with insertion. She is on oral mesalamine  1.6 g TID.  Patient reports that she is having 1 BM per day. She is having mucus and blood when she has a BM. She has had recurrent issues with abdominal pain in her lower abdomen. Also presenting some nausea without vomiting.  The patient denies having any nausea, vomiting, fever, chills, hematochezia, melena, hematemesis, jaundice, pruritus or weight loss.  Last EGD: 01/2023- Ringed appearance mucosa in the esophagus.                           - No endoscopic esophageal abnormality to explain                            patient's dysphagia. Esophagus dilated. Dilated.                           - Multiple fundic gland polyps. Biopsied.                           - Normal examined duodenum. Biopsied.                           - Biopsies were taken with a cold  forceps for                            evaluation of eosinophilic esophagitis. Last Colonoscopy: 03/2022 - The examined portion of the ileum was normal.                           - Mild (Mayo Score 1) ulcerative colitis, improved                            since the last examination. Biopsied.                           - The distal rectum and anal verge are normal on                            retroflexion view. (Chronic inactive proctitis)   Repeat Colonoscopy  10 years  Past Medical History: Past Medical History:  Diagnosis Date   Anxiety    Asthma    Breast cancer (HCC)    Breast cancer of upper-outer quadrant of right female breast (HCC) 06/11/2015   Triple negative, 2 cm    Chemotherapy induced neutropenia (HCC) 09/10/2015   Depression    Diabetes mellitus (HCC)    Type 1 over 15 yrs   Eczema    Environmental allergies    GERD (gastroesophageal reflux disease)    Hypertension    Iron deficiency anemia due to chronic blood loss 06/12/2015   Personal history of chemotherapy    Personal history of radiation therapy    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) [facility])     Past Surgical History: Past Surgical History:  Procedure Laterality Date   BIOPSY  12/05/2018   Procedure: BIOPSY;  Surgeon: Golda Claudis PENNER, MD;  [facility];  Service: Endoscopy;;  duodenum gastric colon   BIOPSY  03/16/2022   Procedure: BIOPSY;  Surgeon: Eartha Angelia Sieving, MD;  [facility];  Service: Gastroenterology;;   BIOPSY  01/24/2023   Procedure: BIOPSY;  Surgeon: Eartha Angelia Sieving, MD;  [facility];  Service: Gastroenterology;;   BREAST LUMPECTOMY Right 2017   CESAREAN SECTION  2007   COLONOSCOPY N/A 10/09/2013   Procedure: COLONOSCOPY;  Surgeon: Claudis PENNER Golda, MD;  [facility];  Service: Endoscopy;  Laterality: N/A;  100   COLONOSCOPY N/A 12/05/2018   Procedure: COLONOSCOPY;  Surgeon: Golda Claudis PENNER, MD;  [facility];  Service: Endoscopy;  Laterality: N/A;   COLONOSCOPY WITH PROPOFOL  N/A 03/16/2022   Procedure: COLONOSCOPY WITH PROPOFOL ;  Surgeon: Eartha Angelia Sieving, MD;  [facility];  Service: Gastroenterology;  Laterality: N/A;  830am, asa 1-2   DILATION AND CURETTAGE OF UTERUS     x 2 for miscarriages   ESOPHAGEAL DILATION N/A 01/24/2023   Procedure: ESOPHAGEAL DILATION;  Surgeon: Eartha Angelia Sieving, MD;  [facility];  Service: Gastroenterology;  Laterality: N/A;  9:00am;asa 2   ESOPHAGOGASTRODUODENOSCOPY     ESOPHAGOGASTRODUODENOSCOPY N/A 12/05/2018   Procedure: ESOPHAGOGASTRODUODENOSCOPY (EGD);  Surgeon: Golda Claudis PENNER, MD;  [facility];  Service: Endoscopy;  Laterality: N/A;  200pm   ESOPHAGOGASTRODUODENOSCOPY (EGD) WITH PROPOFOL  N/A 01/24/2023   Procedure: ESOPHAGOGASTRODUODENOSCOPY (EGD) WITH PROPOFOL ;  Surgeon: Eartha Angelia Sieving, MD;  [facility];  Service: Gastroenterology;  Laterality: N/A;  9:00am;asa 2   FLEXIBLE SIGMOIDOSCOPY     FLEXIBLE SIGMOIDOSCOPY  07/27/2011   Procedure: FLEXIBLE SIGMOIDOSCOPY;  Surgeon: Claudis PENNER Golda, MD;  [facility];  Service: Endoscopy;  Laterality: N/A;  100    Family History: Family History  Problem Relation Age of Onset   Eczema Mother    Non-Hodgkin's lymphoma Mother    Hypertension Mother    Diabetes Mellitus II Mother    Allergic rhinitis Father    Hypertension Father    Diabetes Mellitus II Father    Breast cancer Maternal Aunt    Colon cancer Maternal Uncle    Breast cancer Cousin     Social History: Social History   Tobacco Use  Smoking Status Never   Passive exposure: Current  Smokeless Tobacco Never   Social History   Substance and Sexual Activity  Alcohol Use No   Alcohol/week: 0.0 standard drinks of alcohol   Social History   Substance and Sexual Activity  Drug Use No  Allergies: Allergies  Allergen Reactions   Ace Inhibitors  Itching   Peanut Oil Itching   Shellfish Allergy Itching and Rash   Statins Itching   Adhesive [Tape] Rash   Pravastatin Sodium Itching   Pedi-Pre Tape Spray [Wound Dressing Adhesive]    Shellfish-Derived Products     Medications: Current Outpatient Medications  Medication Sig Dispense Refill   ACCU-CHEK AVIVA PLUS test strip SMARTSIG:Via Meter 6 Times Daily     Accu-Chek Softclix Lancets lancets 4 (four) times daily.     acetaminophen  (TYLENOL ) 500 MG tablet Take 1,000 mg by mouth every 6 (six) hours as needed. For pain     acyclovir  (ZOVIRAX ) 200 MG capsule Take 400 mg by mouth daily. Reported on 07/23/2015     albuterol (PROVENTIL HFA;VENTOLIN HFA) 108 (90 Base) MCG/ACT inhaler Inhale 2 puffs into the lungs every 4 (four) hours as needed for wheezing or shortness of breath.     atorvastatin (LIPITOR) 20 MG tablet Take 40 mg by mouth daily.     AUVELITY 45-105 MG TBCR Take by mouth in the morning and at bedtime.     Blood Glucose Monitoring Suppl (ACCU-CHEK GUIDE) w/Device KIT USE TO CHECK SUGAR     budesonide  (ENTOCORT EC ) 3 MG 24 hr capsule Take 3 capsules (9 mg total) by mouth daily for 28 days, THEN 2 capsules (6 mg total) daily for 28 days, THEN 1 capsule (3 mg total) daily for 28 days. (Patient taking differently: Take 3 capsules (9 mg total) by mouth daily for 28 days, THEN 2 capsules (6 mg total) daily for 28 days, THEN 1 capsule (3 mg total) daily for 28 days. 6 mg daily.) 168 capsule 0   busPIRone (BUSPAR) 10 MG tablet Take 10 mg by mouth 3 (three) times daily as needed.     Continuous Glucose Receiver (DEXCOM G7 RECEIVER) DEVI Use to monitor BG continuously 1 each 0   Continuous Glucose Transmitter (DEXCOM G6 TRANSMITTER) MISC Use to monitor glucose continuously as instructed. Change transmitter every 90 days 1 each 1   dicyclomine  (BENTYL ) 10 MG capsule Take 1 capsule (10 mg total) by mouth 3 (three) times daily as needed for spasms. 60 capsule 1   famotidine  (PEPCID ) 20 MG  tablet Take 1 tablet (20 mg total) by mouth at bedtime. 60 tablet 2   gabapentin  (NEURONTIN ) 300 MG capsule Take 300-600 mg by mouth See admin instructions. Take 1 capsule (300 mg) by mouth in the morning & take 2 capsules (600 mg) by mouth at night  6   hydrochlorothiazide  (HYDRODIURIL ) 25 MG tablet TAKE 1 TABLET BY MOUTH EVERY DAY 90 tablet 0   INGREZZA 40 MG capsule Take 40 mg by mouth daily.     insulin  lispro (HUMALOG ) 100 UNIT/ML KwikPen ADMINISTER 8 TO 11 UNITS UNDER THE SKIN THREE TIMES DAILY BEFORE MEALS 15 mL 2   lamoTRIgine (LAMICTAL) 100 MG tablet Take 100 mg by mouth at bedtime.     LANTUS  SOLOSTAR 100 UNIT/ML Solostar Pen Inject 24 Units into the skin at bedtime. 15 mL 2   loratadine (CLARITIN) 10 MG tablet Take 10 mg by mouth daily.   6   losartan  (COZAAR ) 50 MG tablet TAKE 1 TABLET BY MOUTH EVERY DAY 90 tablet 0   Mesalamine  800 MG TBEC TAKE 2 TABLETS BY MOUTH IN THE MORNING, AT NOON, AND AT BEDTIME 180 tablet 5   ondansetron  (ZOFRAN -ODT) 8 MG disintegrating tablet Take 8 mg by mouth every 8 (eight) hours as needed.  pantoprazole  (PROTONIX ) 40 MG tablet TAKE 1 TABLET(40 MG) BY MOUTH DAILY 30 tablet 3   triamcinolone cream (KENALOG) 0.1 % Apply 1 Application topically 2 (two) times daily.     mesalamine  (CANASA ) 1000 MG suppository Place 1 suppository (1,000 mg total) rectally at bedtime. (Patient not taking: Reported on 07/24/2023) 90 suppository 1   No current facility-administered medications for this visit.    Review of Systems: GENERAL: negative for malaise, night sweats HEENT: No changes in hearing or vision, no nose bleeds or other nasal problems. NECK: Negative for lumps, goiter, pain and significant neck swelling RESPIRATORY: Negative for cough, wheezing CARDIOVASCULAR: Negative for chest pain, leg swelling, palpitations, orthopnea GI: SEE HPI MUSCULOSKELETAL: Negative for joint pain or swelling, back pain, and muscle pain. SKIN: Negative for lesions, rash PSYCH:  Negative for sleep disturbance, mood disorder and recent psychosocial stressors. HEMATOLOGY Negative for prolonged bleeding, bruising easily, and swollen nodes. ENDOCRINE: Negative for cold or heat intolerance, polyuria, polydipsia and goiter. NEURO: negative for tremor, gait imbalance, syncope and seizures. The remainder of the review of systems is noncontributory.   Physical Exam: BP 133/65 (BP Location: Right Arm, Patient Position: Sitting, Cuff Size: Large)   Pulse 78   Temp (!) 97.1 F (36.2 C) (Temporal)   Ht 5' 1 (1.549 m)   Wt 199 lb 9.6 oz (90.5 kg)   BMI 37.71 kg/m  GENERAL: The patient is AO x3, in no acute distress. HEENT: Head is normocephalic and atraumatic. EOMI are intact. Mouth is well hydrated and without lesions. NECK: Supple. No masses LUNGS: Clear to auscultation. No presence of rhonchi/wheezing/rales. Adequate chest expansion HEART: RRR, normal s1 and s2. ABDOMEN: Soft, nontender, no guarding, no peritoneal signs, and nondistended. BS +. No masses. EXTREMITIES: Without any cyanosis, clubbing, rash, lesions or edema. NEUROLOGIC: AOx3, no focal motor deficit. SKIN: no jaundice, no rashes  Imaging/Labs: as above  I personally reviewed and interpreted the available labs, imaging and endoscopic files.  Impression and Plan: Brittney [name] is a 53 y.o. female with past medical history of  ulcerative proctosigmoiditis, breast cancer, GERD, gastric disease, type 1 diabetes, coming for follow-up of ulcerative colitis.  Patient has a history of proctosigmoiditis which has been previously managed with mesalamine  for multiple years.  Initially, she responded to these medications but more recently she has had persistent mucus discharge and abdominal pain episodes.  Notably, she had poor response to budesonide  and could not tolerate topical mesalamine  recently.  Her last colonoscopy showed improvement of her colitis but still presenting some mild Mayo 1 inflammation in her  rectum.  It is possible her symptoms are related to uncontrolled UC.  I emphasized the importance of obtaining a fecal calprotectin, which will help guide our treatment at this point.  She will continue taking mesalamine  3 times daily for now.  The patient and I had a thorough discussion regarding the potential treatments for management of colitis refractory to 5-ASA compounds.  She will read about these options.  If active colitis, I would favor SP-1s or Entyvio.  -Continue mesalamine  1.6 g three times a day -Stop budesonide  -Patient should perform fecal calprotectin.  If normal, we will need to proceed with a flexible sigmoidoscopy to rule out active colitis.  If elevated, we will need to switch you to a different treatment for ulcerative colitis -Read about Velsipity, Entyvio, Tremfya, Skirizi, Stelara, Remicade, Humira  All questions were answered.      Toribio Fortune, MD Gastroenterology and Hepatology [facility]

## 2023-07-24 NOTE — Patient Instructions (Addendum)
 Continue mesalamine  1.6 g three times a day Stop budesonide  Please drop the sample for fecal calprotectin analysis.  If normal, we will need to proceed with a flexible sigmoidoscopy.  If elevated, we will need to switch you to a different treatment for ulcerative colitis Read about Velsipity, Entyvio, Tremfya, Skirizi, Stelara, Remicade, Humira

## 2023-07-25 ENCOUNTER — Ambulatory Visit (INDEPENDENT_AMBULATORY_CARE_PROVIDER_SITE_OTHER): Admitting: Gastroenterology

## 2023-07-26 ENCOUNTER — Other Ambulatory Visit (INDEPENDENT_AMBULATORY_CARE_PROVIDER_SITE_OTHER): Payer: Self-pay | Admitting: Gastroenterology

## 2023-07-29 LAB — CALPROTECTIN: Calprotectin: 405 ug/g — ABNORMAL HIGH

## 2023-07-31 ENCOUNTER — Inpatient Hospital Stay (HOSPITAL_COMMUNITY): Admission: RE | Admit: 2023-07-31 | Payer: Medicaid Other | Source: Ambulatory Visit

## 2023-07-31 ENCOUNTER — Other Ambulatory Visit (HOSPITAL_COMMUNITY): Payer: Self-pay | Admitting: Physician Assistant

## 2023-07-31 ENCOUNTER — Ambulatory Visit: Payer: Self-pay | Admitting: Gastroenterology

## 2023-07-31 ENCOUNTER — Inpatient Hospital Stay: Payer: Medicaid Other | Attending: Family Medicine

## 2023-07-31 DIAGNOSIS — Z1231 Encounter for screening mammogram for malignant neoplasm of breast: Secondary | ICD-10-CM

## 2023-08-01 ENCOUNTER — Ambulatory Visit (INDEPENDENT_AMBULATORY_CARE_PROVIDER_SITE_OTHER): Admitting: Gastroenterology

## 2023-08-02 ENCOUNTER — Encounter: Attending: Family Medicine | Admitting: Nutrition

## 2023-08-02 VITALS — Ht 61.0 in | Wt 199.6 lb

## 2023-08-02 DIAGNOSIS — E1065 Type 1 diabetes mellitus with hyperglycemia: Secondary | ICD-10-CM | POA: Insufficient documentation

## 2023-08-02 DIAGNOSIS — E1042 Type 1 diabetes mellitus with diabetic polyneuropathy: Secondary | ICD-10-CM | POA: Insufficient documentation

## 2023-08-02 DIAGNOSIS — E1165 Type 2 diabetes mellitus with hyperglycemia: Secondary | ICD-10-CM | POA: Insufficient documentation

## 2023-08-02 DIAGNOSIS — Z713 Dietary counseling and surveillance: Secondary | ICD-10-CM | POA: Diagnosis not present

## 2023-08-02 NOTE — Patient Instructions (Signed)
 Goals Eat three meals per day at times discussed. Use sliding scale insulin  schedule. Increase fruits, vegetables and whole grains Cut out processed foods Drink 4-5 bottles of water  and cut out sparkling water . Exercise 150 minutes a week

## 2023-08-02 NOTE — Progress Notes (Signed)
 Medical Nutrition Therapy  Appointment Start time:  1050  Appointment End time:  1215  Primary concerns today: Dm Type 1,   Referral diagnosis: e10.42 Preferred learning style: NO preference. Learning readiness: Ready   NUTRITION ASSESSMENT  53 yr old bfemale referred for Type 1 DM. PCP Dr. Toribio Alderman on Lantus  24 units a day and Humalog  8-11 units with meals. Has a Dexcom CGM. Sees Dr. Lenis, Endocrinology. Current diet is inconsistent to meet her nutritional needs. Skipping meals and then not taking insulin  with meals. Drinks juice some due to low blood sugars when she skips a meal.  TIR 36%, 23% high, 37% very hight, 3% low 1% very low A1C 9.5%. Says she has a lot of stress in her life right now and that is impacting her self care. Needs re education on diet, timing of meals, meal patterns and food choices. Not exercising. She is willing to work on meal planning, meal prepping and improving blood sugar control. H/o Breast cancer. Discussed importance of tight blood sugar control and weight loss to prevent reoccurrence of breast cancer Diet needs more whole plant based foods from a garden and cut out processed foods.   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     acetaminophen  (TYLENOL ) 500 MG tablet Take 1,000 mg by mouth every 6 (six) hours as needed. For pain     acyclovir  (ZOVIRAX ) 200 MG capsule Take 400 mg by mouth daily. Reported on 07/23/2015     albuterol (PROVENTIL HFA;VENTOLIN HFA) 108 (90 Base) MCG/ACT inhaler Inhale 2 puffs into the lungs every 4 (four) hours as needed for wheezing or shortness of breath.     atorvastatin (LIPITOR) 20 MG tablet Take 40 mg by mouth daily.     AUVELITY 45-105 MG TBCR Take by mouth in the morning and at bedtime.     Blood Glucose Monitoring Suppl (ACCU-CHEK GUIDE) w/Device KIT USE TO CHECK SUGAR     budesonide  (ENTOCORT EC ) 3 MG 24 hr capsule Take 3 capsules (9 mg total) by mouth daily for 28 days, THEN 2 capsules (6 mg total) daily for 28 days, THEN 1 capsule (3 mg total) daily for 28 days. (Patient taking differently: Take 3 capsules (9 mg total) by mouth daily for 28 days, THEN 2 capsules (6 mg total) daily for 28 days, THEN 1 capsule (3 mg total) daily for 28 days. 6 mg daily.) 168 capsule 0   busPIRone (BUSPAR) 10 MG tablet Take 10 mg by mouth 3 (three) times daily as needed.     Continuous Glucose Receiver (  DEXCOM G7 RECEIVER) DEVI Use to monitor BG continuously 1 each 0   Continuous Glucose Transmitter (DEXCOM G6 TRANSMITTER) MISC Use to monitor glucose continuously as instructed. Change transmitter every 90 days 1 each 1   dicyclomine  (BENTYL ) 10 MG capsule Take 1 capsule (10 mg total) by mouth 3 (three) times daily as needed for spasms. 60 capsule 1   famotidine  (PEPCID ) 20 MG tablet Take 1 tablet (20 mg total) by mouth at bedtime. 60 tablet 2   gabapentin  (NEURONTIN ) 300 MG capsule Take 300-600 mg by mouth See admin instructions. Take 1 capsule (300 mg) by mouth in the morning & take 2 capsules (600 mg) by mouth at night  6   hydrochlorothiazide  (HYDRODIURIL ) 25 MG tablet TAKE 1 TABLET BY MOUTH EVERY DAY 90 tablet 0    INGREZZA 40 MG capsule Take 40 mg by mouth daily.     insulin  lispro (HUMALOG ) 100 UNIT/ML KwikPen ADMINISTER 8 TO 11 UNITS UNDER THE SKIN THREE TIMES DAILY BEFORE MEALS 15 mL 2   lamoTRIgine (LAMICTAL) 100 MG tablet Take 100 mg by mouth at bedtime.     LANTUS  SOLOSTAR 100 UNIT/ML Solostar Pen Inject 24 Units into the skin at bedtime. 15 mL 2   loratadine (CLARITIN) 10 MG tablet Take 10 mg by mouth daily.   6   losartan  (COZAAR ) 50 MG tablet TAKE 1 TABLET BY MOUTH EVERY DAY 90 tablet 0   mesalamine  (CANASA ) 1000 MG suppository Place 1 suppository (1,000 mg total) rectally at bedtime. (Patient not taking: Reported on 07/24/2023) 90 suppository 1   Mesalamine  800 MG TBEC TAKE 2 TABLETS BY MOUTH IN THE MORNING, AT NOON, AND AT BEDTIME 180 tablet 5   ondansetron  (ZOFRAN -ODT) 8 MG disintegrating tablet Take 8 mg by mouth every 8 (eight) hours as needed.     pantoprazole  (PROTONIX ) 40 MG tablet TAKE 1 TABLET(40 MG) BY MOUTH DAILY 30 tablet 3   triamcinolone cream (KENALOG) 0.1 % Apply 1 Application topically 2 (two) times daily.     No current facility-administered medications on file prior to visit.    Labs:  Lab Results  Component Value Date   HGBA1C 9.3 05/09/2023    Notable Signs/Symptoms: Increased thirsty, fatigue, increased urination  Lifestyle & Dietary Hx LIves with her daughter Not working  Eats meals at home and away from home.  Estimated daily fluid intake: 30-45 oz Supplements: none Sleep:  5-6 hrs. Stress / self-care: Has a lot of stress Current average weekly physical activity: Not much.  24-Hr Dietary Recall First Meal: 10 am Bluberry muffin and strawberry yoplait Snack:  Second Meal: skipped; not hungry Snack: Carbonated sparking water - Apple juice due to low blood sugar   6-7 pmChocolate vanilla pudding Cheetos Third Meal: 10 pm Knorrs cheddar broccoli rice, sauted chicken, onions/ peppers with alfredo sauce, water  Snack:  Beverages: water  and sparkling  water    Estimated Energy Needs Calories: 1200 Carbohydrate: 135g Protein: 90g Fat: 33g   NUTRITION DIAGNOSIS  NB-1.1 Food and nutrition-related knowledge deficit As related to inconsistent CHO intake.  As evidenced by A1C 9.3%.SABRA   NUTRITION INTERVENTION  Nutrition education (E-1) on the following topics:  Nutrition and Diabetes education provided on My Plate, CHO counting, meal planning, portion sizes, timing of meals, avoiding snacks between meals unless having a low blood sugar, target ranges for A1C and blood sugars, signs/symptoms and treatment of hyper/hypoglycemia, monitoring blood sugars, taking medications as prescribed, benefits of exercising 30 minutes per day and prevention of complications of DM.  Lifestyle Medicine  - Whole Food, Plant Predominant Nutrition is highly recommended: Eat Plenty of vegetables, Mushrooms, fruits, Legumes, Whole Grains, Nuts, seeds in lieu of processed meats, processed snacks/pastries red meat, poultry, eggs.    -It is better to avoid simple carbohydrates including: Cakes, Sweet Desserts, Ice Cream, Soda (diet and regular), Sweet Tea, Candies, Chips, Cookies, Store Bought Juices, Alcohol in Excess of  1-2 drinks a day, Lemonade,  Artificial Sweeteners, Doughnuts, Coffee Creamers, Sugar-free Products, etc, etc.  This is not a complete list.....  Exercise: If you are able: 30 -60 minutes a day ,4 days a week, or 150 minutes a week.  The longer the better.  Combine stretch, strength, and aerobic activities.  If you were told in the past that you have high risk for cardiovascular diseases, you may seek evaluation by your heart doctor prior to initiating moderate to intense exercise programs.   Handouts Provided Include  Know your numbers S/s of hyper/hypoglycemia Lifestyle Medicine  Learning Style & Readiness for Change Teaching method utilized: Visual & Auditory  Demonstrated degree of understanding via: Teach Back  Barriers to  learning/adherence to lifestyle change: stress level  Goals Established by Pt Goals Eat three meals per day at times discussed. Use sliding scale insulin  schedule. Increase fruits, vegetables and whole grains Cut out processed foods Drink 4-5 bottles of water  and cut out sparkling water . Exercise 150 minutes a week   MONITORING & EVALUATION Dietary intake, weekly physical activity, and blood sugars in 1 week.  Next Steps  Patient is to work on eating meals consistently at times discussed and take medication as prescribed.SABRA

## 2023-08-04 ENCOUNTER — Ambulatory Visit: Admitting: Allergy & Immunology

## 2023-08-08 ENCOUNTER — Inpatient Hospital Stay: Payer: Medicaid Other | Admitting: Physician Assistant

## 2023-08-11 ENCOUNTER — Encounter (HOSPITAL_COMMUNITY): Payer: Self-pay

## 2023-08-11 ENCOUNTER — Ambulatory Visit (HOSPITAL_COMMUNITY)
Admission: RE | Admit: 2023-08-11 | Discharge: 2023-08-11 | Disposition: A | Discharge status: Home / Self Care | Source: Ambulatory Visit | Attending: Physician Assistant | Admitting: Physician Assistant

## 2023-08-11 DIAGNOSIS — Z1231 Encounter for screening mammogram for malignant neoplasm of breast: Secondary | ICD-10-CM | POA: Diagnosis present

## 2023-08-14 ENCOUNTER — Encounter: Payer: Self-pay | Admitting: Nutrition

## 2023-08-16 ENCOUNTER — Telehealth: Payer: Self-pay | Admitting: Nutrition

## 2023-08-16 ENCOUNTER — Encounter: Payer: Self-pay | Admitting: Nutrition

## 2023-08-16 NOTE — Telephone Encounter (Signed)
 TC to pt to discuss Dexcom readings. Dexcom showing TIR 36%, 23% high, 37% very high, 3% low, 1% very low. She notes she sometimes forgets her meal time insulin  or takes it after meals. Trying to work on eating 3 meals per day now and not skip meals.  Currently taking 24 units at night and 8-11 units with meals. Discussed some changes with food choices to improve blood sugars. Will share information with Dr. Lenis for further recommendations.

## 2023-08-16 NOTE — Telephone Encounter (Signed)
 TC to pt. Per Dr. Barbette instruction, advised her her wants her to increase her Lantus  to 28 units at night and start Novolog  from 80-150 with 8 units plus sliding scale. She verbalized understanding. Encouraged her to eat meals on time and take Novolog  before meals and not after meals to help improve blood sugar control. She verbalized understanding. Next appointment with Dr. Lenis is Sept 16th, my appt  with her is Sept 11th.

## 2023-08-17 ENCOUNTER — Other Ambulatory Visit: Payer: Self-pay | Admitting: *Deleted

## 2023-08-18 ENCOUNTER — Ambulatory Visit: Admitting: Allergy & Immunology

## 2023-08-18 ENCOUNTER — Encounter: Payer: Self-pay | Admitting: Allergy & Immunology

## 2023-08-18 DIAGNOSIS — L299 Pruritus, unspecified: Secondary | ICD-10-CM

## 2023-08-18 DIAGNOSIS — J3089 Other allergic rhinitis: Secondary | ICD-10-CM

## 2023-08-18 DIAGNOSIS — J302 Other seasonal allergic rhinitis: Secondary | ICD-10-CM

## 2023-08-18 MED ORDER — BUDESONIDE-FORMOTEROL FUMARATE 80-4.5 MCG/ACT IN AERO: 2.0000 | INHALATION_SPRAY | Freq: Two times a day (BID) | RESPIRATORY_TRACT | 5 refills | Status: DC

## 2023-08-18 MED ORDER — CETIRIZINE HCL 10 MG PO TABS: 10.0000 mg | ORAL_TABLET | Freq: Every day | ORAL | 1 refills | Status: DC

## 2023-08-18 MED ORDER — TRIAMCINOLONE ACETONIDE 0.1 % EX CREA: 1.0000 | TOPICAL_CREAM | Freq: Two times a day (BID) | CUTANEOUS | 2 refills | Status: DC

## 2023-08-18 NOTE — Patient Instructions (Addendum)
 1. Chronic rhinitis - Testing today showed: grasses, weeds, indoor molds, and outdoor molds - Copy of test results provided.  - Avoidance measures provided. - Stop taking: Claritin - Start taking: Zyrtec  (cetirizine ) 10mg  tablet once daily and Flonase (fluticasone) one spray per nostril daily (AIM FOR EAR ON EACH SIDE) - You can use an extra dose of the antihistamine, if needed, for breakthrough symptoms.  - Consider nasal saline rinses 1-2 times daily to remove allergens from the nasal cavities as well as help with mucous clearance (this is especially helpful to do before the nasal sprays are given) - Consider allergy  shots as a means of long-term control. - Allergy  shots re-train and reset the immune system to ignore environmental allergens and decrease the resulting immune response to those allergens (sneezing, itchy watery eyes, runny nose, nasal congestion, etc).    - Allergy  shots improve symptoms in 75-85% of patients.  - We can discuss more at the next appointment if the medications are not working for you.  2. Mild persistent asthma, uncomplicated - Lung testing looked pretty good at the last visit.  - We are going to add on a daily medication called Symbicort  in the morning to control your shortness of breath   - Symbicort  contains a long-acting albuterol combined with an inhaled steroid. - This **should** help with your shortness of breath throughout the day. - Try to pay attention to your shortness of breath episodes throughout the day.   3. Flexural atopic dermatitis - Continue with the triamcinolone  twice daily as you are doing. - Continue with the moisturizing as you are doing.   4. Food allergies (shellfish and peanut and sunflower) - We will confirm the negative test results today with blood work.  - We will call you in 1-2 weeks with the results of the testing.  - We will defer on EpiPen until the testing is done.   5. Itching with intermittent hives (hives  improved) - These seem to be resolved.  - Continue to note future outbreaks and triggers.   6. Return in about 3 months (around 11/18/2023). You can have the follow up appointment with Dr. Iva or a Nurse Practicioner (our Nurse Practitioners are excellent and always have Physician oversight!).    Please inform us  of any Emergency Department visits, hospitalizations, or changes in symptoms. Call us  before going to the ED for breathing or allergy  symptoms since we might be able to fit you in for a sick visit. Feel free to contact us  anytime with any questions, problems, or concerns.  It was a pleasure to meet you today!  Websites that have reliable patient information: 1. American Academy of Asthma, Allergy , and Immunology: www.aaaai.org 2. Food Allergy  Research and Education (FARE): foodallergy.org 3. Mothers of Asthmatics: http://www.asthmacommunitynetwork.org 4. American College of Allergy , Asthma, and Immunology: www.acaai.org      "Like" us  on Facebook and Instagram for our latest updates!      A healthy democracy works best when [employer] participate! Make sure you are registered to vote! If you have moved or changed any of your contact information, you will need to get this updated before voting! Scan the QR codes below to learn more!         Airborne Adult Perc - 08/18/23 0908     Time Antigen Placed 9091    Allergen Manufacturer Jestine    Location Back    1. Control-Buffer 50% Glycerol Negative    2. Control-Histamine 2+    3. [place]  Negative    4. [place] Negative    5. Johnson Negative    6. [place]  Blue Negative    7. Meadow Fescue Negative    8. Perennial Rye Negative    9. Timothy Negative    10. Ragweed Mix Negative    11. Cocklebur Negative    12. Plantain,  English Negative    13. Baccharis Negative    14. Dog Fennel Negative    15. Russian Thistle Negative    16. Lamb's Quarters Negative    17. Sheep Sorrell Negative    18. Rough Pigweed  Negative    19. Marsh Elder, Rough Negative    20. Mugwort, Common Negative    21. Box, Elder Negative    22. Cedar, red Negative    23. Sweet Gum Negative    24. Pecan Pollen Negative    25. Pine Mix Negative    26. Walnut, Black Pollen Negative    27. Red Mulberry Negative    28. Ash Mix Negative    29. Birch Mix Negative    30. Beech American Negative    31. Cottonwood, [place] Negative    32. Hickory, White Negative    33. Maple Mix Negative    34. Oak, [place] Mix Negative    35. Sycamore Eastern Negative    36. Alternaria Alternata Negative    37. Cladosporium Herbarum Negative    38. Aspergillus Mix Negative    39. Penicillium Mix Negative    40. Bipolaris Sorokiniana (Helminthosporium) Negative    41. Drechslera Spicifera (Curvularia) Negative    42. Mucor Plumbeus Negative    43. Fusarium Moniliforme Negative    44. Aureobasidium Pullulans (pullulara) Negative    45. Rhizopus Oryzae Negative    46. Botrytis Cinera Negative    47. Epicoccum Nigrum Negative    48. Phoma Betae Negative    49. Dust Mite Mix Negative    50. Cat Hair 10,000 BAU/ml Negative    51.  Dog Epithelia Negative    52. Mixed Feathers Negative    53. Horse Epithelia Negative    54. Cockroach, German Negative    55. Tobacco Leaf Negative          Intradermal - 08/18/23 0938     Time Antigen Placed 0945    Allergen Manufacturer Jestine    Location Arm    Number of Test 16    Control Negative    Bahia 2+    [place] 2+    Johnson 1+    7 Grass 2+    Ragweed Mix Negative    Weed Mix 2+    Tree Mix Negative    Mold 1 2+    Mold 2 3+    Mold 3 Negative    Mold 4 2+    Mite Mix Negative    Cat Negative    Dog Negative    Cockroach Negative          Food Adult Perc - 08/18/23 0900     Time Antigen Placed 9078    Allergen Manufacturer Jestine    Location Back    Number of allergen test 7    1. Peanut Negative    8. Shellfish Mix Negative    23. Shrimp Negative    24. Crab  Negative    25. Lobster Negative    26. Oyster Negative    27. Scallops Negative    Comments n  Reducing Pollen Exposure  The American Academy of Allergy , Asthma and Immunology suggests the following steps to reduce your exposure to pollen during allergy  seasons.    Do not hang sheets or clothing out to dry; pollen may collect on these items. Do not mow lawns or spend time around freshly cut grass; mowing stirs up pollen. Keep windows closed at night.  Keep car windows closed while driving. Minimize morning activities outdoors, a time when pollen counts are usually at their highest. Stay indoors as much as possible when pollen counts or humidity is high and on windy days when pollen tends to remain in the air longer. Use air conditioning when possible.  Many air conditioners have filters that trap the pollen spores. Use a HEPA room air filter to remove pollen form the indoor air you breathe.  Control of Mold Allergen   Mold and fungi can grow on a variety of surfaces provided certain temperature and moisture conditions exist.  Outdoor molds grow on plants, decaying vegetation and soil.  The major outdoor mold, Alternaria and Cladosporium, are found in very high numbers during hot and dry conditions.  Generally, a late Summer - Fall peak is seen for common outdoor fungal spores.  Rain will temporarily lower outdoor mold spore count, but counts rise rapidly when the rainy period ends.  The most important indoor molds are Aspergillus and Penicillium.  Dark, humid and poorly ventilated basements are ideal sites for mold growth.  The next most common sites of mold growth are the bathroom and the kitchen.  Outdoor (Seasonal) Mold Control  Positive outdoor molds via skin testing: Alternaria and Cladosporium  Use air conditioning and keep windows closed Avoid exposure to decaying vegetation. Avoid leaf raking. Avoid grain handling. Consider wearing a face mask if working in moldy  areas.    Indoor (Perennial) Mold Control   Positive indoor molds via skin testing: Aspergillus, Penicillium, Fusarium, Aureobasidium (Pullulara), and Rhizopus  Maintain humidity below 50%. Clean washable surfaces with 5% bleach solution. Remove sources e.g. contaminated carpets.

## 2023-08-18 NOTE — Progress Notes (Signed)
 FOLLOW UP  Date of Service/Encounter:  08/18/23   Assessment:   Perennial and seasonal allergic rhinitis (grasses, weeds, indoor molds, and outdoor molds)   Mild persistent asthma, uncomplicated   Flexural atopic dermatitis   Breast cancer (diagnosed in 2017) - treated with lumpectomy, radiation and chemotherapy   Ulcerative colitis (diagnosed in 2007) - on mesalamine  daily and Enterocort for flares  Plan/Recommendations:   1. Chronic rhinitis - Testing today showed: grasses, weeds, indoor molds, and outdoor molds - Copy of test results provided.  - Avoidance measures provided. - Stop taking: Claritin - Start taking: Zyrtec  (cetirizine ) 10mg  tablet once daily and Flonase (fluticasone) one spray per nostril daily (AIM FOR EAR ON EACH SIDE) - You can use an extra dose of the antihistamine, if needed, for breakthrough symptoms.  - Consider nasal saline rinses 1-2 times daily to remove allergens from the nasal cavities as well as help with mucous clearance (this is especially helpful to do before the nasal sprays are given) - Consider allergy  shots as a means of long-term control. - Allergy  shots re-train and reset the immune system to ignore environmental allergens and decrease the resulting immune response to those allergens (sneezing, itchy watery eyes, runny nose, nasal congestion, etc).    - Allergy  shots improve symptoms in 75-85% of patients.  - We can discuss more at the next appointment if the medications are not working for you.  2. Mild persistent asthma, uncomplicated - Lung testing looked pretty good at the last visit.  - We are going to add on a daily medication called Symbicort  in the morning to control your shortness of breath   - Symbicort  contains a long-acting albuterol combined with an inhaled steroid. - This **should** help with your shortness of breath throughout the day. - Try to pay attention to your shortness of breath episodes throughout the day.    3. Flexural atopic dermatitis - Continue with the triamcinolone  twice daily as you are doing. - Continue with the moisturizing as you are doing.   4. Food allergies (shellfish and peanut and sunflower) - We will confirm the negative test results today with blood work.  - We will call you in 1-2 weeks with the results of the testing.  - We will defer on EpiPen until the testing is done.   5. Itching with intermittent hives (hives improved) - These seem to be resolved.  - Continue to note future outbreaks and triggers.   6. Return in about 3 months (around 11/18/2023). You can have the follow up appointment with Dr. Iva or a Nurse Practicioner (our Nurse Practitioners are excellent and always have Physician oversight!).      Subjective:   Brittney [name] is a 53 y.o. female presenting today for follow up of  Chief Complaint  Patient presents with   Allergy  Testing    1-55    Brittney [name] has a history of the following: Patient Active Problem List   Diagnosis Date Noted   Esophageal dysphagia 01/24/2023   Insulin  long-term use (HCC) 07/20/2022   Class 1 obesity due to excess calories with serious comorbidity and body mass index (BMI) of 31.0 to 31.9 in adult 07/20/2022   Belching 06/01/2022   Rectal bleeding 03/18/2022   Bloating 12/13/2021   Vitamin D  deficiency 06/22/2021   Class 2 severe obesity due to excess calories with serious comorbidity and body mass index (BMI) of 35.0 to 35.9 in adult [facility]) 06/22/2021   Gastroesophageal reflux disease 10/15/2018  Uncontrolled type 1 diabetes mellitus with hyperglycemia (HCC) 06/04/2018   Uncontrolled type 2 diabetes mellitus with hyperglycemia (HCC) 03/21/2018   Neutropenic fever (HCC) 09/11/2015   Chemotherapy induced neutropenia (HCC) 09/10/2015   Nausea without vomiting 07/09/2015   Genetic counseling and testing 06/29/2015   Iron deficiency anemia due to chronic blood loss 06/12/2015   Malignant neoplasm  of upper-outer quadrant of right female breast (HCC) 06/11/2015   Bilateral cataracts 05/12/2015   Type 2 diabetes mellitus without complication (HCC) 05/12/2015   Diabetic gastroparesis (HCC) 08/26/2013   THYROID NODULE, RIGHT 04/17/2008   Ulcerative colitis (HCC) 11/01/2007   Type 1 diabetes mellitus (HCC) 10/11/2007   Mixed hyperlipidemia 10/11/2007   ANEMIA-NOS 10/11/2007   Depression 10/11/2007   GERD 10/11/2007   Peptic ulcer 10/11/2007   Headache 10/11/2007   History of colonic polyps 10/11/2007   Personal history of urinary disorder 10/11/2007   Personal history presenting hazards to health 10/11/2007    History obtained from: chart review and patient.  Discussed the use of AI scribe software for clinical note transcription with the patient and/or guardian, who gave verbal consent to proceed.  Brittney [name] is a 53 y.o. female presenting for skin testing. She was last seen on July 18th. We could not do testing because her insurance company does not cover testing on the same day as a New Patient visit. She has been off of all antihistamines 3 days in anticipation of the testing.   At that time, we decided to do skin testing. For her asthma, lung testing looked pretty good.  We continued with albuterol as needed and sent Symbicort  to use to help with her shortness of breath episodes.  Was controlled with triamcinolone  twice daily.  She was allergic to shellfish and peanut and sunflower and decided to do testing to those.   She experiences her worst allergy  symptoms during the spring and fall. She has undergone allergy  testing in the past but does not recall the specific allergens identified. She uses Claritin for symptom relief and Flonase as needed. She has not picked up her prescribed Symbicort .  She has a history of allergies to latex and adhesives, which cause burning and scarring when she uses products like Band-Aids or lidocaine  patches. She avoids these products due to adverse  reactions.  She experiences gastrointestinal discomfort when consuming pork, describing it as 'very painful,' and thus avoids eating pork. She is able to consume beef without issues. She avoids shellfish due to itching but has no throat swelling. She also avoids all nuts except almonds to prevent any allergic reactions.  At the last visit, we talked about sesame causing throat irritation, but she seems to not remember the today.  Regardless, she has had hummus in the past without problem, which likely indicates that she has tolerated sesame in the form of tahini.  She reports a sensation of her face feeling flushed and funny, particularly on the top and cheeks, but has no throat issues. She experiences coughing, especially when it is wet outside.  Otherwise, there have been no changes to her past medical history, surgical history, family history, or social history.    Review of systems otherwise negative other than that mentioned in the HPI.    Objective:   There were no vitals taken for this visit. There is no height or weight on file to calculate BMI.    Physical exam deferred since this was a skin testing appointment only.   Diagnostic studies:   Allergy  Studies:  Airborne Adult Perc - 08/18/23 0908     Time Antigen Placed 9091    Allergen Manufacturer Jestine    Location Back    1. Control-Buffer 50% Glycerol Negative    2. Control-Histamine 2+    3. [place] Negative    4. [place] Negative    5. [place] Negative    6. [place]  Blue Negative    7. Meadow Fescue Negative    8. Perennial Rye Negative    9. Timothy Negative    10. Ragweed Mix Negative    11. Cocklebur Negative    12. Plantain,  English Negative    13. Baccharis Negative    14. Dog Fennel Negative    15. Russian Thistle Negative    16. Lamb's Quarters Negative    17. Sheep Sorrell Negative    18. Rough Pigweed Negative    19. Marsh Elder, Rough Negative    20. Mugwort, Common Negative    21. Box,  Elder Negative    22. Cedar, red Negative    23. Sweet Gum Negative    24. Pecan Pollen Negative    25. Pine Mix Negative    26. Walnut, Black Pollen Negative    27. Red Mulberry Negative    28. Ash Mix Negative    29. Birch Mix Negative    30. Beech American Negative    31. Cottonwood, [place] Negative    32. Hickory, White Negative    33. Maple Mix Negative    34. Oak, [place] Mix Negative    35. Sycamore Eastern Negative    36. Alternaria Alternata Negative    37. Cladosporium Herbarum Negative    38. Aspergillus Mix Negative    39. Penicillium Mix Negative    40. Bipolaris Sorokiniana (Helminthosporium) Negative    41. Drechslera Spicifera (Curvularia) Negative    42. Mucor Plumbeus Negative    43. Fusarium Moniliforme Negative    44. Aureobasidium Pullulans (pullulara) Negative    45. Rhizopus Oryzae Negative    46. Botrytis Cinera Negative    47. Epicoccum Nigrum Negative    48. Phoma Betae Negative    49. Dust Mite Mix Negative    50. Cat Hair 10,000 BAU/ml Negative    51.  Dog Epithelia Negative    52. Mixed Feathers Negative    53. Horse Epithelia Negative    54. Cockroach, German Negative    55. Tobacco Leaf Negative          Intradermal - 08/18/23 0938     Time Antigen Placed 0945    Allergen Manufacturer Jestine    Location Arm    Number of Test 16    Control Negative    [place] 2+    [place] 2+    Johnson 1+    7 Grass 2+    Ragweed Mix Negative    Weed Mix 2+    Tree Mix Negative    Mold 1 2+    Mold 2 3+    Mold 3 Negative    Mold 4 2+    Mite Mix Negative    Cat Negative    Dog Negative    Cockroach Negative          Food Adult Perc - 08/18/23 0900     Time Antigen Placed 9078    Allergen Manufacturer Jestine    Location Back    Number of allergen test 7    1. Peanut Negative    8. Shellfish Mix Negative  23. Shrimp Negative    24. Crab Negative    25. Lobster Negative    26. Oyster Negative    27. Scallops Negative    Comments           Allergy  testing results were read and interpreted by myself, documented by clinical staff.      Marty Shaggy, MD  [facility] 

## 2023-08-19 ENCOUNTER — Other Ambulatory Visit: Payer: Self-pay | Admitting: "Endocrinology

## 2023-08-21 ENCOUNTER — Ambulatory Visit (INDEPENDENT_AMBULATORY_CARE_PROVIDER_SITE_OTHER): Admitting: Gastroenterology

## 2023-08-24 ENCOUNTER — Encounter (INDEPENDENT_AMBULATORY_CARE_PROVIDER_SITE_OTHER): Payer: Self-pay | Admitting: Gastroenterology

## 2023-08-24 ENCOUNTER — Ambulatory Visit (INDEPENDENT_AMBULATORY_CARE_PROVIDER_SITE_OTHER): Admitting: Gastroenterology

## 2023-08-24 VITALS — BP 127/70 | HR 74 | Temp 97.3°F | Ht 61.0 in | Wt 203.2 lb

## 2023-08-24 DIAGNOSIS — K5130 Ulcerative (chronic) rectosigmoiditis without complications: Secondary | ICD-10-CM | POA: Diagnosis not present

## 2023-08-24 DIAGNOSIS — Z111 Encounter for screening for respiratory tuberculosis: Secondary | ICD-10-CM

## 2023-08-24 DIAGNOSIS — K51311 Ulcerative (chronic) rectosigmoiditis with rectal bleeding: Secondary | ICD-10-CM

## 2023-08-24 DIAGNOSIS — R1030 Lower abdominal pain, unspecified: Secondary | ICD-10-CM

## 2023-08-24 DIAGNOSIS — Z1159 Encounter for screening for other viral diseases: Secondary | ICD-10-CM

## 2023-08-24 NOTE — Patient Instructions (Addendum)
 Schedule flexible sigmoidoscopy - will collect C. Difficile and GI pathogen panel at that time Perform blood workup Continue mesalamine  Read about Velsipity, Entyvio, Tremfya, Skirizi, Stelara

## 2023-08-24 NOTE — Progress Notes (Signed)
 Toribio Fortune, M.D. Gastroenterology & Hepatology [facility] Gastroenterology [address] [place], [place] [address]  Primary Care Physician: Toribio Jerel MATSU, MD [address] [place] [address]  I will communicate my assessment and recommendations to the referring MD via EMR.  Problems: Ulcerative proctosigmoiditis   History of Present Illness: Brittney [name] is a 53 y.o. female with past medical history of  ulcerative proctosigmoiditis, breast cancer, GERD, gastric disease, type 1 diabetes, coming for follow-up of ulcerative colitis.  The patient was last seen on 07/24/2023. At that time, the patient was advised to continue mesalamine  and was advised to read about different options for management of refractory proctitis.  Fecal calprotectin was checked which was elevated 405 (07/24/2023).  Patient reports that she is still seeing mucus and blood in stool despite taking mesalamine  PO. She is having some lower abdominal pain with the bowel movements, which also causes nausea and vomiting. Stools are soft, has 1-2 Bms per day.  The patient denies having any nausea, vomiting, fever, chills, hematochezia, melena, hematemesis, jaundice, pruritus or weight loss.  She states she has not read about the recommended medications.  Last EGD: 01/2023- Ringed appearance mucosa in the esophagus.                           - No endoscopic esophageal abnormality to explain                            patient's dysphagia. Esophagus dilated. Dilated.                           - Multiple fundic gland polyps. Biopsied.                           - Normal examined duodenum. Biopsied.                           - Biopsies were taken with a cold forceps for                            evaluation of eosinophilic esophagitis. Last Colonoscopy: 03/2022 - The examined portion of the ileum was normal.                           - Mild (Mayo Score 1) ulcerative colitis, improved                             since the last examination. Biopsied.                           - The distal rectum and anal verge are normal on                            retroflexion view. (Chronic inactive proctitis)   Repeat Colonoscopy 10 years  Past Medical History: Past Medical History:  Diagnosis Date   Anxiety    Asthma    Breast cancer (HCC)    Breast cancer of upper-outer quadrant of right female breast (HCC) 06/11/2015   Triple negative, 2 cm  Chemotherapy induced neutropenia (HCC) 09/10/2015   Depression    Diabetes mellitus (HCC)    Type 1 over 15 yrs   Eczema    Environmental allergies    GERD (gastroesophageal reflux disease)    Hypertension    Iron deficiency anemia due to chronic blood loss 06/12/2015   Personal history of chemotherapy    Personal history of radiation therapy    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) [facility])     Past Surgical History: Past Surgical History:  Procedure Laterality Date   BIOPSY  12/05/2018   Procedure: BIOPSY;  Surgeon: Golda Claudis PENNER, MD;  [facility];  Service: Endoscopy;;  duodenum gastric colon   BIOPSY  03/16/2022   Procedure: BIOPSY;  Surgeon: Eartha Angelia Sieving, MD;  [facility];  Service: Gastroenterology;;   BIOPSY  01/24/2023   Procedure: BIOPSY;  Surgeon: Eartha Angelia, Sieving, MD;  [facility];  Service: Gastroenterology;;   BREAST BIOPSY Left 2016   FIBROCYSTIC CHANGES WITH ASSOCIATED CALCIFICATIONS, FIBROADENOMATOID CHANGES   BREAST LUMPECTOMY Right 2017   CESAREAN SECTION  2007   COLONOSCOPY N/A 10/09/2013   Procedure: COLONOSCOPY;  Surgeon: Claudis PENNER Golda, MD;  [facility];  Service: Endoscopy;  Laterality: N/A;  100   COLONOSCOPY N/A 12/05/2018   Procedure: COLONOSCOPY;  Surgeon: Golda Claudis PENNER, MD;  [facility];  Service: Endoscopy;  Laterality: N/A;   COLONOSCOPY WITH PROPOFOL  N/A 03/16/2022   Procedure: COLONOSCOPY WITH PROPOFOL ;   Surgeon: Eartha Angelia Sieving, MD;  [facility];  Service: Gastroenterology;  Laterality: N/A;  830am, asa 1-2   DILATION AND CURETTAGE OF UTERUS     x 2 for miscarriages   ESOPHAGEAL DILATION N/A 01/24/2023   Procedure: ESOPHAGEAL DILATION;  Surgeon: Eartha Angelia Sieving, MD;  [facility];  Service: Gastroenterology;  Laterality: N/A;  9:00am;asa 2   ESOPHAGOGASTRODUODENOSCOPY     ESOPHAGOGASTRODUODENOSCOPY N/A 12/05/2018   Procedure: ESOPHAGOGASTRODUODENOSCOPY (EGD);  Surgeon: Golda Claudis PENNER, MD;  [facility];  Service: Endoscopy;  Laterality: N/A;  200pm   ESOPHAGOGASTRODUODENOSCOPY (EGD) WITH PROPOFOL  N/A 01/24/2023   Procedure: ESOPHAGOGASTRODUODENOSCOPY (EGD) WITH PROPOFOL ;  Surgeon: Eartha Angelia Sieving, MD;  [facility];  Service: Gastroenterology;  Laterality: N/A;  9:00am;asa 2   FLEXIBLE SIGMOIDOSCOPY     FLEXIBLE SIGMOIDOSCOPY  07/27/2011   Procedure: FLEXIBLE SIGMOIDOSCOPY;  Surgeon: Claudis PENNER Golda, MD;  [facility];  Service: Endoscopy;  Laterality: N/A;  100    Family History: Family History  Problem Relation Age of Onset   Eczema Mother    Non-Hodgkin's lymphoma Mother    Hypertension Mother    Diabetes Mellitus II Mother    Allergic rhinitis Father    Hypertension Father    Diabetes Mellitus II Father    Breast cancer Maternal Aunt    Colon cancer Maternal Uncle    Breast cancer Cousin     Social History: Social History   Tobacco Use  Smoking Status Never   Passive exposure: Current  Smokeless Tobacco Never   Social History   Substance and Sexual Activity  Alcohol Use No   Alcohol/week: 0.0 standard drinks of alcohol   Social History   Substance and Sexual Activity  Drug Use No    Allergies: Allergies  Allergen Reactions   Ace Inhibitors Itching   Peanut Oil Itching   Shellfish Allergy  Itching and Rash   Statins Itching   Adhesive [Tape] Rash  Pravastatin Sodium  Itching   Pedi-Pre Tape Spray [Wound Dressing Adhesive]    Shellfish-Derived Products     Medications: Current Outpatient Medications  Medication Sig Dispense Refill   ACCU-CHEK AVIVA PLUS test strip SMARTSIG:Via Meter 6 Times Daily     Accu-Chek Softclix Lancets lancets 4 (four) times daily.     acetaminophen  (TYLENOL ) 500 MG tablet Take 1,000 mg by mouth every 6 (six) hours as needed. For pain     acyclovir  (ZOVIRAX ) 200 MG capsule Take 400 mg by mouth daily. Reported on 07/23/2015     albuterol (PROVENTIL HFA;VENTOLIN HFA) 108 (90 Base) MCG/ACT inhaler Inhale 2 puffs into the lungs every 4 (four) hours as needed for wheezing or shortness of breath.     atorvastatin (LIPITOR) 20 MG tablet Take 40 mg by mouth daily.     AUVELITY 45-105 MG TBCR Take by mouth in the morning and at bedtime.     Blood Glucose Monitoring Suppl (ACCU-CHEK GUIDE) w/Device KIT USE TO CHECK SUGAR     budesonide -formoterol  (SYMBICORT ) 80-4.5 MCG/ACT inhaler Inhale 2 puffs into the lungs in the morning and at bedtime. 1 each 5   busPIRone (BUSPAR) 10 MG tablet Take 10 mg by mouth 3 (three) times daily as needed.     cetirizine  (ZYRTEC ) 10 MG tablet Take 1 tablet (10 mg total) by mouth daily. 90 tablet 1   Continuous Glucose Receiver (DEXCOM G7 RECEIVER) DEVI Use to monitor BG continuously 1 each 0   Continuous Glucose Transmitter (DEXCOM G6 TRANSMITTER) MISC Use to monitor glucose continuously as instructed. Change transmitter every 90 days 1 each 1   dicyclomine  (BENTYL ) 10 MG capsule Take 1 capsule (10 mg total) by mouth 3 (three) times daily as needed for spasms. 60 capsule 1   famotidine  (PEPCID ) 20 MG tablet Take 1 tablet (20 mg total) by mouth at bedtime. 60 tablet 2   gabapentin  (NEURONTIN ) 300 MG capsule Take 300-600 mg by mouth See admin instructions. Take 1 capsule (300 mg) by mouth in the morning & take 2 capsules (600 mg) by mouth at night  6   hydrochlorothiazide  (HYDRODIURIL ) 25 MG tablet TAKE 1 TABLET BY  MOUTH EVERY DAY 90 tablet 0   INGREZZA 40 MG capsule Take 40 mg by mouth daily.     insulin  lispro (HUMALOG ) 100 UNIT/ML KwikPen ADMINISTER 8 TO 11 UNITS UNDER THE SKIN THREE TIMES DAILY BEFORE MEALS 15 mL 2   lamoTRIgine (LAMICTAL) 100 MG tablet Take 100 mg by mouth at bedtime.     LANTUS  SOLOSTAR 100 UNIT/ML Solostar Pen Inject 24 Units into the skin at bedtime. (Patient taking differently: Inject 28 Units into the skin at bedtime.) 15 mL 2   losartan  (COZAAR ) 50 MG tablet TAKE 1 TABLET BY MOUTH EVERY DAY 90 tablet 1   Mesalamine  800 MG TBEC TAKE 2 TABLETS BY MOUTH IN THE MORNING, AT NOON, AND AT BEDTIME 180 tablet 5   ondansetron  (ZOFRAN -ODT) 8 MG disintegrating tablet Take 8 mg by mouth every 8 (eight) hours as needed.     pantoprazole  (PROTONIX ) 40 MG tablet TAKE 1 TABLET(40 MG) BY MOUTH DAILY 30 tablet 3   triamcinolone  cream (KENALOG ) 0.1 % Apply 1 Application topically 2 (two) times daily. Use for two weeks at a time to affected areas. 30 g 2   mesalamine  (CANASA ) 1000 MG suppository Place 1 suppository (1,000 mg total) rectally at bedtime. (Patient not taking: Reported on 08/24/2023) 90 suppository 1   No current facility-administered medications for this  visit.    Review of Systems: GENERAL: negative for malaise, night sweats HEENT: No changes in hearing or vision, no nose bleeds or other nasal problems. NECK: Negative for lumps, goiter, pain and significant neck swelling RESPIRATORY: Negative for cough, wheezing CARDIOVASCULAR: Negative for chest pain, leg swelling, palpitations, orthopnea GI: SEE HPI MUSCULOSKELETAL: Negative for joint pain or swelling, back pain, and muscle pain. SKIN: Negative for lesions, rash PSYCH: Negative for sleep disturbance, mood disorder and recent psychosocial stressors. HEMATOLOGY Negative for prolonged bleeding, bruising easily, and swollen nodes. ENDOCRINE: Negative for cold or heat intolerance, polyuria, polydipsia and goiter. NEURO: negative for  tremor, gait imbalance, syncope and seizures. The remainder of the review of systems is noncontributory.   Physical Exam: BP 127/70 (BP Location: Left Arm, Patient Position: Sitting, Cuff Size: Large)   Pulse 74   Temp (!) 97.3 F (36.3 C) (Temporal)   Ht 5' 1 (1.549 m)   Wt 203 lb 3.2 oz (92.2 kg)   BMI 38.39 kg/m  GENERAL: The patient is AO x3, in no acute distress. HEENT: Head is normocephalic and atraumatic. EOMI are intact. Mouth is well hydrated and without lesions. NECK: Supple. No masses LUNGS: Clear to auscultation. No presence of rhonchi/wheezing/rales. Adequate chest expansion HEART: RRR, normal s1 and s2. ABDOMEN: Soft, nontender, no guarding, no peritoneal signs, and nondistended. BS +. No masses. EXTREMITIES: Without any cyanosis, clubbing, rash, lesions or edema. NEUROLOGIC: AOx3, no focal motor deficit. SKIN: no jaundice, no rashes  Imaging/Labs: as above  I personally reviewed and interpreted the available labs, imaging and endoscopic files.  Impression and Plan: Brittney [name] is a 53 y.o. female with past medical history of  ulcerative proctosigmoiditis, breast cancer, GERD, gastric disease, type 1 diabetes, coming for follow-up of ulcerative colitis.  The patient has presented persistent change in her bowel movements which have been concerning for active ulcerative colitis in the past failed to respond to previous treatment with mesalamine .  Her fecal calprotectin is mildly elevated and most recent blood workup.  We had previously discussed the possibility of switching to a different agent.  Unfortunately, the patient has not read about the possible options offered to her.  She is interested in trying the medications with the least side effects.  She will read about the available options.  For now, we will schedule a flexible sigmoidoscopy and collect C. difficile and GI pathogen panel testing at that time.  Will obtain surveillance labs today and hepatitis  B/tuberculosis testing.  -Schedule flexible sigmoidoscopy - will collect C. Difficile and GI pathogen panel at that time - Check CBC, CMP, CRP, hepatitis B/tuberculosis screening -Continue mesalamine  1600 mg 3 times daily -Read about Velsipity, Entyvio, Tremfya, Skirizi, Stelara   All questions were answered.      Toribio Fortune, MD Gastroenterology and Hepatology [facility] Gastroenterology

## 2023-08-25 LAB — IGE NUT PROF. W/COMPONENT RFLX
F017-IgE Hazelnut (Filbert): <0.1 kU/L
F018-IgE Brazil Nut: <0.1 kU/L
F020-IgE Almond: <0.1 kU/L
F202-IgE Cashew Nut: <0.1 kU/L
F203-IgE Pistachio Nut: <0.1 kU/L
F256-IgE Walnut: <0.1 kU/L
Macadamia Nut, IgE: <0.1 kU/L
Peanut, IgE: <0.1 kU/L
Pecan Nut IgE: <0.1 kU/L

## 2023-08-25 LAB — ALLERGEN SUNFLOWER SEED K84: Sunflower Seed k84: <0.1 kU/L

## 2023-08-25 LAB — ALLERGEN PROFILE, SHELLFISH
Clam IgE: <0.1 kU/L
F023-IgE Crab: <0.1 kU/L
F080-IgE Lobster: <0.1 kU/L
F290-IgE Oyster: <0.1 kU/L
Scallop IgE: <0.1 kU/L
Shrimp IgE: <0.1 kU/L

## 2023-08-28 ENCOUNTER — Other Ambulatory Visit: Payer: Self-pay | Admitting: "Endocrinology

## 2023-08-29 ENCOUNTER — Encounter: Payer: Self-pay | Admitting: *Deleted

## 2023-08-29 ENCOUNTER — Ambulatory Visit: Payer: Self-pay | Admitting: Allergy & Immunology

## 2023-08-31 ENCOUNTER — Other Ambulatory Visit: Payer: Self-pay

## 2023-08-31 ENCOUNTER — Ambulatory Visit: Payer: Self-pay | Admitting: Gastroenterology

## 2023-08-31 DIAGNOSIS — C50411 Malignant neoplasm of upper-outer quadrant of right female breast: Secondary | ICD-10-CM

## 2023-08-31 DIAGNOSIS — E538 Deficiency of other specified B group vitamins: Secondary | ICD-10-CM

## 2023-08-31 DIAGNOSIS — D500 Iron deficiency anemia secondary to blood loss (chronic): Secondary | ICD-10-CM

## 2023-08-31 LAB — CBC WITH DIFFERENTIAL/PLATELET
Absolute Lymphocytes: 1268 {cells}/uL (ref 850–3900)
Absolute Monocytes: 332 {cells}/uL (ref 200–950)
Basophils Absolute: 8 {cells}/uL (ref 0–200)
Basophils Relative: 0.2 %
Eosinophils Absolute: 0 {cells}/uL — ABNORMAL LOW (ref 15–500)
Eosinophils Relative: 0 %
HCT: 35.9 % (ref 35.0–45.0)
Hemoglobin: 11.4 g/dL — ABNORMAL LOW (ref 11.7–15.5)
MCH: 28.1 pg (ref 27.0–33.0)
MCHC: 31.8 g/dL — ABNORMAL LOW (ref 32.0–36.0)
MCV: 88.4 fL (ref 80.0–100.0)
MPV: 11.6 fL (ref 7.5–12.5)
Monocytes Relative: 7.9 %
Neutro Abs: 2591 {cells}/uL (ref 1500–7800)
Neutrophils Relative %: 61.7 %
Platelets: 159 Thousand/uL (ref 140–400)
RBC: 4.06 Million/uL (ref 3.80–5.10)
RDW: 13.1 % (ref 11.0–15.0)
Total Lymphocyte: 30.2 %
WBC: 4.2 Thousand/uL (ref 3.8–10.8)

## 2023-08-31 LAB — COMPREHENSIVE METABOLIC PANEL WITH GFR
AG Ratio: 2.2 (calc) (ref 1.0–2.5)
ALT: 31 U/L — ABNORMAL HIGH (ref 6–29)
AST: 28 U/L (ref 10–35)
Albumin: 4.2 g/dL (ref 3.6–5.1)
Alkaline phosphatase (APISO): 90 U/L (ref 37–153)
BUN: 19 mg/dL (ref 7–25)
CO2: 33 mmol/L — ABNORMAL HIGH (ref 20–32)
Calcium: 9.4 mg/dL (ref 8.6–10.4)
Chloride: 103 mmol/L (ref 98–110)
Creat: 0.69 mg/dL (ref 0.50–1.03)
Globulin: 1.9 g/dL (ref 1.9–3.7)
Glucose, Bld: 134 mg/dL — ABNORMAL HIGH (ref 65–99)
Potassium: 4.1 mmol/L (ref 3.5–5.3)
Sodium: 142 mmol/L (ref 135–146)
Total Bilirubin: 0.4 mg/dL (ref 0.2–1.2)
Total Protein: 6.1 g/dL (ref 6.1–8.1)
eGFR: 105 mL/min/1.73m2 (ref >=60)

## 2023-08-31 LAB — QUANTIFERON-TB GOLD PLUS
Mitogen-NIL: 6.72 [IU]/mL
NIL: 0.05 [IU]/mL
QuantiFERON-TB Gold Plus: NEGATIVE
TB1-NIL: <0 [IU]/mL
TB2-NIL: <0 [IU]/mL

## 2023-08-31 LAB — HEPATITIS B SURFACE ANTIGEN: Hepatitis B Surface Ag: NONREACTIVE

## 2023-08-31 LAB — C-REACTIVE PROTEIN: CRP: <3 mg/L (ref <8.0)

## 2023-09-05 ENCOUNTER — Inpatient Hospital Stay: Attending: Physician Assistant

## 2023-09-05 DIAGNOSIS — Z800 Family history of malignant neoplasm of digestive organs: Secondary | ICD-10-CM | POA: Diagnosis not present

## 2023-09-05 DIAGNOSIS — Z807 Family history of other malignant neoplasms of lymphoid, hematopoietic and related tissues: Secondary | ICD-10-CM | POA: Insufficient documentation

## 2023-09-05 DIAGNOSIS — Z171 Estrogen receptor negative status [ER-]: Secondary | ICD-10-CM

## 2023-09-05 DIAGNOSIS — D500 Iron deficiency anemia secondary to blood loss (chronic): Secondary | ICD-10-CM | POA: Insufficient documentation

## 2023-09-05 DIAGNOSIS — Z853 Personal history of malignant neoplasm of breast: Secondary | ICD-10-CM | POA: Diagnosis not present

## 2023-09-05 DIAGNOSIS — K51911 Ulcerative colitis, unspecified with rectal bleeding: Secondary | ICD-10-CM | POA: Diagnosis not present

## 2023-09-05 DIAGNOSIS — E538 Deficiency of other specified B group vitamins: Secondary | ICD-10-CM | POA: Diagnosis not present

## 2023-09-05 DIAGNOSIS — Z803 Family history of malignant neoplasm of breast: Secondary | ICD-10-CM | POA: Diagnosis not present

## 2023-09-05 DIAGNOSIS — Z9221 Personal history of antineoplastic chemotherapy: Secondary | ICD-10-CM | POA: Diagnosis not present

## 2023-09-05 DIAGNOSIS — R079 Chest pain, unspecified: Secondary | ICD-10-CM | POA: Diagnosis not present

## 2023-09-05 LAB — COMPREHENSIVE METABOLIC PANEL WITH GFR
ALT: 25 U/L (ref 0–44)
AST: 22 U/L (ref 15–41)
Albumin: 3.8 g/dL (ref 3.5–5.0)
Alkaline Phosphatase: 87 U/L (ref 38–126)
Anion gap: 11 (ref 5–15)
BUN: 11 mg/dL (ref 6–20)
CO2: 28 mmol/L (ref 22–32)
Calcium: 8.9 mg/dL (ref 8.9–10.3)
Chloride: 102 mmol/L (ref 98–111)
Creatinine, Ser: 0.65 mg/dL (ref 0.44–1.00)
GFR, Estimated: >60 mL/min (ref >60)
Glucose, Bld: 250 mg/dL — ABNORMAL HIGH (ref 70–99)
Potassium: 4 mmol/L (ref 3.5–5.1)
Sodium: 141 mmol/L (ref 135–145)
Total Bilirubin: 0.8 mg/dL (ref 0.0–1.2)
Total Protein: 6.6 g/dL (ref 6.5–8.1)

## 2023-09-05 LAB — CBC WITH DIFFERENTIAL/PLATELET
Abs Immature Granulocytes: 0.01 K/uL (ref 0.00–0.07)
Basophils Absolute: 0 K/uL (ref 0.0–0.1)
Basophils Relative: 0 %
Eosinophils Absolute: 0 K/uL (ref 0.0–0.5)
Eosinophils Relative: 0 %
HCT: 39.6 % (ref 36.0–46.0)
Hemoglobin: 12.5 g/dL (ref 12.0–15.0)
Immature Granulocytes: 0 %
Lymphocytes Relative: 36 %
Lymphs Abs: 1.5 K/uL (ref 0.7–4.0)
MCH: 28.2 pg (ref 26.0–34.0)
MCHC: 31.6 g/dL (ref 30.0–36.0)
MCV: 89.4 fL (ref 80.0–100.0)
Monocytes Absolute: 0.3 K/uL (ref 0.1–1.0)
Monocytes Relative: 7 %
Neutro Abs: 2.4 K/uL (ref 1.7–7.7)
Neutrophils Relative %: 57 %
Platelets: 161 K/uL (ref 150–400)
RBC: 4.43 MIL/uL (ref 3.87–5.11)
RDW: 14.1 % (ref 11.5–15.5)
WBC: 4.2 K/uL (ref 4.0–10.5)
nRBC: 0 % (ref 0.0–0.2)

## 2023-09-05 LAB — FERRITIN: Ferritin: 117 ng/mL (ref 11–307)

## 2023-09-05 LAB — VITAMIN D 25 HYDROXY (VIT D DEFICIENCY, FRACTURES): Vit D, 25-Hydroxy: 33.63 ng/mL (ref 30–100)

## 2023-09-05 LAB — IRON AND TIBC
Iron: 59 ug/dL (ref 28–170)
Saturation Ratios: 17 % (ref 10.4–31.8)
TIBC: 358 ug/dL (ref 250–450)
UIBC: 299 ug/dL

## 2023-09-05 LAB — VITAMIN B12: Vitamin B-12: 425 pg/mL (ref 180–914)

## 2023-09-08 ENCOUNTER — Other Ambulatory Visit (INDEPENDENT_AMBULATORY_CARE_PROVIDER_SITE_OTHER): Payer: Self-pay | Admitting: Gastroenterology

## 2023-09-08 LAB — METHYLMALONIC ACID, SERUM: Methylmalonic Acid, Quantitative: 84 nmol/L (ref 0–378)

## 2023-09-12 NOTE — Progress Notes (Unsigned)
 [facility] [address], [place] [address]   CLINIC:  Medical Oncology/Hematology  PCP:  Toribio Jerel MATSU, MD [address] [place] [address] [phone]    REASON FOR VISIT:  Follow-up for history of stage Ia triple negative right-sided breast cancer + iron deficiency anemia   PRIOR THERAPY: AC x4, weekly Taxol  x2 cycles followed by Taxotere  x3 cycles    CURRENT THERAPY: Surveillance and intermittent iron infusions  BRIEF ONCOLOGIC HISTORY:   Oncology History  Malignant neoplasm of upper-outer quadrant of right female breast (HCC)  04/29/2015 Initial Biopsy   upper outer quadrant mass R breast 2.9 cm by ultrasound, high grade invasive ductal carcinoma ER< 1%, PR 1%, HER -2 not amplified, Ki-67 66%   05/13/2015 Cancer Staging   pT1cN0   05/13/2015 Surgery   lumpectomy and sentinel node biopsies X 3, 2 cm tumor, 3 negative nodes    05/21/2015 Imaging   CT [facility] with postsurgical changes related to R breast lympectomy and R axillary LN dissection, no findings suspicious for metastatic disease   06/12/2015 Procedure   Port placement by Dr. Ivery   06/17/2015 Imaging   MUGA- Normal left ventricular ejection fraction equal to 62.9%.   06/25/2015 - 08/13/2015 Chemotherapy   AC x 4   08/27/2015 - 09/03/2015 Chemotherapy   Weekly Taxol  x 2 cycles    09/11/2015 - 09/15/2015 Hospital Admission   Neutropenic fever, ARF secondary to antibiotics   10/01/2015 - 11/12/2015 Chemotherapy   Taxotere  75 mg/m2 Q3 weeks x 3 cycles     10/01/2015 Treatment Plan Change   Taxol  changed to Taxotere  given hospitalization.     - 01/14/2016 Radiation Therapy   Radiation in [facility]   05/11/2016 Mammogram   IMPRESSION: No evidence of malignancy within either breast. Expected postsurgical changes within the right breast.      CANCER STAGING:  Cancer Staging  Malignant neoplasm of upper-outer quadrant of right female breast [facility]) Staging form: Breast, AJCC  7th Edition - Clinical stage from 05/13/2015: Stage IA (T1c, N0, M0) - Signed by Berry Debby RAMAN, PA-C on 06/25/2015   INTERVAL HISTORY:   Brittney [name], a 53 y.o. female, returns for routine follow-up of her history of right-sided breast cancer iron deficiency anemia. Hiilei was last seen on 08/02/2022 by Pleasant Barefoot PA-C.   At today's visit, she reports feeling fair. At her last visit, she had noted some significant depression, but this is doing better on medication, although she does not feel like her normal self.  She follows with a specialist in [place] who manages this. She denies any major changes in her health, apart from some new neck and back pain, being managed by PCP with Physical Therapy.  She continues to have occasional right breast soreness and intermittent lymphedema of her right arm, and continues to use her lymphedema sleeve and pump at home as needed. She denies any new breast lumps or axillary lymphadenopathy. She has intermittent chest pain, usually after eating.  She attributes her pain to stress and indigestion. (Advised to discuss with her PCP and to go to ED if any active chest pain that doe snot resolve with rest.) No dyspnea or abdominal pain.    She has no seizures focal neurologic deficits.    No B symptoms such as fever, chills, night sweats, unintentional weight loss.   She reports occasional headaches, but nothing outside of her normal headache pattern.   She has hot  flashes and other menopausal symptoms and difficulty sleeping.  Regarding her iron deficiency anemia, she reports that her energy level is at baseline with mild fatigue.  She follows with Dr. Eartha for her UC, and reports scant rectal bleeding with most bowel movements since May of this year.  No gross hematochezia, hematemesis, melena, or epistaxis. She denies any pica.  She reports 50% energy and 75% appetite.   She is maintaining stable weight at this  time.  ASSESSMENT & PLAN:  1.  Stage Ia triple negative breast cancer of the right breast - Status post lumpectomy and sentinel lymph node biopsy by Dr. DeMason on 05/13/2015. - Status post adjuvant chemotherapy with dose dense AC followed by 3 cycles of dose dense docetaxel  from 06/25/2015-11/12/2015. - Last mammogram (08/11/2023) BI-RADS Category 1, negative.  Extremely dense bilateral breast tissue noted, which lowers sensitivity of mammography. - Physical examination today (09/13/23) with area of thickened tissue approximately 1 cm inferior to right nipple, 6 o'clock position; difficult to tell via palpation if this represents focal edema or breast nodule.  Patient was able to palpate area during exam, and reports that this was new for her.  Otherwise, right breast lumpectomy scar in upper outer quadrant is stable.  No left breast abnormalities. - No red flag symptoms of recurrence per patient report    - Most recent labs (09/05/2023): CBC unremarkable, CMP at baseline with normal kidney function and normal LFTs.  Vitamin D  is normal. - PLAN: We will check right breast DIAGNOSTIC mammogram/ultrasound due to new palpable abnormality, question soft tissue edema versus nodule - Otherwise, next screening mammogram due August 2026   - We will see her for repeat labs, physical exam, and office visit in 1 year.   We will consider discharge to PCP at the 10-year mark (2027).  2.  Iron deficiency state -This is from intermittent blood loss from ulcerative colitis - She has intermittent bleeding per rectum from her ulcerative colitis - Colonoscopy (03/16/2022): Mild (Mayo score 1) ulcerative colitis, improved since last examination - Last Feraheme  was on 06/13/2019 and 06/20/2019. - Most recent labs (09/05/2023): Hemoglobin 12.5, ferritin 117, iron saturation 17% - PLAN: No indication for IV iron at this time.  Repeat labs at follow-up visit in 1 year.   3.  Vitamin B12 deficiency - She was previously taking  vitamin B12 cyanocobalamin  1 mg daily, but stopped taking it for unknown reasons - Most recent labs (09/05/2023) show normal vitamin B12 at 425 with normal MMA. - PLAN: No indication for B12 supplementation at this time.  We will recheck at follow-up.     4.  Other history   - PMH: Ulcerative colitis (followed closely by GI), depression, insomnia, type 1 diabetes - She reports intermittent chest pain.  She is strongly advised to discuss this with her PCP and to seek immediate medical attention if she has exertional chest pain or chest pain that does not resolve with rest.  PLAN SUMMARY: >> Unilateral diagnostic mammogram of right breast (next available)  >> Bilateral screening mammogram due around 08/11/2024 >> Labs in 1 year = CBC/D, CMP, Vitamin D , ferritin, iron/TIBC, B12, MMA >> OFFICE visit in 1 year (1 week after labs/mammogram)    REVIEW OF SYSTEMS:   Review of Systems  Constitutional:  Positive for fatigue. Negative for appetite change, chills, diaphoresis, fever and unexpected weight change.  HENT:   Negative for lump/mass and nosebleeds.   Eyes:  Negative for eye problems.  Respiratory:  Negative for  cough, hemoptysis and shortness of breath.   Cardiovascular:  Positive for chest pain (at times). Negative for leg swelling and palpitations.  Gastrointestinal:  Positive for nausea. Negative for abdominal pain, blood in stool, constipation, diarrhea and vomiting.  Genitourinary:  Negative for hematuria.   Skin: Negative.   Neurological:  Positive for headaches and numbness. Negative for dizziness and light-headedness.  Hematological:  Does not bruise/bleed easily.  Psychiatric/Behavioral:  Positive for depression.     PHYSICAL EXAM:   Performance status (ECOG): 1 - Symptomatic but completely ambulatory  There were no vitals filed for this visit. Wt Readings from Last 3 Encounters:  08/24/23 203 lb 3.2 oz (92.2 kg)  08/02/23 199 lb 9.6 oz (90.5 kg)  07/24/23 199 lb 9.6 oz (90.5  kg)   Physical Exam Constitutional:      Appearance: Normal appearance. She is obese.  Cardiovascular:     Rate and Rhythm: Normal rate and regular rhythm.  Pulmonary:     Effort: Pulmonary effort is normal.     Breath sounds: Normal breath sounds.  Chest:       Comments: New area of thickened tissue approximately 1 cm inferior to right nipple, 6 o'clock position; difficult to tell via palpation if this represents focal edema or breast nodule.  Patient was able to palpate area during exam, and reports that this was new for her.  Otherwise, right breast lumpectomy scar in upper outer quadrant is stable.  No left breast abnormalities.  Lymphadenopathy:     Upper Body:     Right upper body: No supraclavicular, axillary or pectoral adenopathy.     Left upper body: No supraclavicular, axillary or pectoral adenopathy.  Neurological:     General: No focal deficit present.     Mental Status: She is alert and oriented to person, place, and time.  Psychiatric:        Mood and Affect: Mood normal.        Behavior: Behavior normal.      PAST MEDICAL/SURGICAL HISTORY:  Past Medical History:  Diagnosis Date   Anxiety    Asthma    Breast cancer (HCC)    Breast cancer of upper-outer quadrant of right female breast (HCC) 06/11/2015   Triple negative, 2 cm    Chemotherapy induced neutropenia (HCC) 09/10/2015   Depression    Diabetes mellitus (HCC)    Type 1 over 15 yrs   Eczema    Environmental allergies    GERD (gastroesophageal reflux disease)    Hypertension    Iron deficiency anemia due to chronic blood loss 06/12/2015   Personal history of chemotherapy    Personal history of radiation therapy    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) [facility])    Past Surgical History:  Procedure Laterality Date   BIOPSY  12/05/2018   Procedure: BIOPSY;  Surgeon: Golda Claudis PENNER, MD;  [facility];  Service: Endoscopy;;  duodenum gastric colon   BIOPSY  03/16/2022   Procedure:  BIOPSY;  Surgeon: Eartha Angelia Sieving, MD;  [facility];  Service: Gastroenterology;;   BIOPSY  01/24/2023   Procedure: BIOPSY;  Surgeon: Eartha Angelia, Sieving, MD;  [facility];  Service: Gastroenterology;;   BREAST BIOPSY Left 2016   FIBROCYSTIC CHANGES WITH ASSOCIATED CALCIFICATIONS, FIBROADENOMATOID CHANGES   BREAST LUMPECTOMY Right 2017   CESAREAN SECTION  2007   COLONOSCOPY N/A 10/09/2013   Procedure: COLONOSCOPY;  Surgeon: Claudis PENNER Golda, MD;  [facility];  Service:  Endoscopy;  Laterality: N/A;  100   COLONOSCOPY N/A 12/05/2018   Procedure: COLONOSCOPY;  Surgeon: Golda Claudis PENNER, MD;  [facility];  Service: Endoscopy;  Laterality: N/A;   COLONOSCOPY WITH PROPOFOL  N/A 03/16/2022   Procedure: COLONOSCOPY WITH PROPOFOL ;  Surgeon: Eartha Angelia Sieving, MD;  [facility];  Service: Gastroenterology;  Laterality: N/A;  830am, asa 1-2   DILATION AND CURETTAGE OF UTERUS     x 2 for miscarriages   ESOPHAGEAL DILATION N/A 01/24/2023   Procedure: ESOPHAGEAL DILATION;  Surgeon: Eartha Angelia Sieving, MD;  [facility];  Service: Gastroenterology;  Laterality: N/A;  9:00am;asa 2   ESOPHAGOGASTRODUODENOSCOPY     ESOPHAGOGASTRODUODENOSCOPY N/A 12/05/2018   Procedure: ESOPHAGOGASTRODUODENOSCOPY (EGD);  Surgeon: Golda Claudis PENNER, MD;  [facility];  Service: Endoscopy;  Laterality: N/A;  200pm   ESOPHAGOGASTRODUODENOSCOPY (EGD) WITH PROPOFOL  N/A 01/24/2023   Procedure: ESOPHAGOGASTRODUODENOSCOPY (EGD) WITH PROPOFOL ;  Surgeon: Eartha Angelia Sieving, MD;  [facility];  Service: Gastroenterology;  Laterality: N/A;  9:00am;asa 2   FLEXIBLE SIGMOIDOSCOPY     FLEXIBLE SIGMOIDOSCOPY  07/27/2011   Procedure: FLEXIBLE SIGMOIDOSCOPY;  Surgeon: Claudis PENNER Golda, MD;  [facility];  Service: Endoscopy;  Laterality: N/A;  100    SOCIAL HISTORY:  Social History   Socioeconomic History    Marital status: Single    Spouse name: Not on file   Number of children: Not on file   Years of education: Not on file   Highest education level: Not on file  Occupational History   Not on file  Tobacco Use   Smoking status: Never    Passive exposure: Current   Smokeless tobacco: Never  Vaping Use   Vaping status: Never Used  Substance and Sexual Activity   Alcohol use: No    Alcohol/week: 0.0 standard drinks of alcohol   Drug use: No   Sexual activity: Not on file  Other Topics Concern   Not on file  Social History Narrative   Not on file   Social Drivers of Health   Financial Resource Strain: Low Risk  (12/12/2019)   Overall Financial Resource Strain (CARDIA)    Difficulty of Paying Living Expenses: Not hard at all  Food Insecurity: No Food Insecurity (08/03/2023)   [facility]   Hunger Vital Sign    Within the past 12 months, you worried that your food would run out before you got the money to buy more.: Never true    Within the past 12 months, the food you bought just didn't last and you didn't have money to get more.: Never true  Transportation Needs: No Transportation Needs (08/03/2023)   Received from [facility]   PRAPARE - Transportation    Lack of Transportation (Medical): No    Lack of Transportation (Non-Medical): No  Physical Activity: Inactive (08/03/2023)   Received from [facility]   Exercise Vital Sign    On average, how many days per week do you engage in moderate to strenuous exercise (like a brisk walk)?: 0 days    On average, how many minutes do you engage in exercise at this level?: 0 min  Stress: Stress Concern Present (08/03/2023)   Received from [facility] - Occupational Stress Questionnaire    Do you feel stress - tense, restless, nervous, or anxious, or unable to sleep at night because your mind is troubled all the  time - these days?: Very much  Social Connections:  Moderately Integrated (12/12/2019)   Social Connection and Isolation Panel    Frequency of Communication with Friends and Family: More than three times a week    Frequency of Social Gatherings with Friends and Family: Three times a week    Attends Religious Services: 1 to 4 times per year    Active Member of Clubs or Organizations: No    Attends Banker Meetings: 1 to 4 times per year    Marital Status: Never married  Intimate Partner Violence: Not At Risk (12/12/2019)   Humiliation, Afraid, Rape, and Kick questionnaire    Fear of Current or Ex-Partner: No    Emotionally Abused: No    Physically Abused: No    Sexually Abused: No    FAMILY HISTORY:  Family History  Problem Relation Age of Onset   Eczema Mother    Non-Hodgkin's lymphoma Mother    Hypertension Mother    Diabetes Mellitus II Mother    Allergic rhinitis Father    Hypertension Father    Diabetes Mellitus II Father    Breast cancer Maternal Aunt    Colon cancer Maternal Uncle    Breast cancer Cousin     CURRENT MEDICATIONS:  Current Outpatient Medications  Medication Sig Dispense Refill   ACCU-CHEK AVIVA PLUS test strip SMARTSIG:Via Meter 6 Times Daily     Accu-Chek Softclix Lancets lancets 4 (four) times daily.     acetaminophen  (TYLENOL ) 500 MG tablet Take 1,000 mg by mouth every 6 (six) hours as needed. For pain     acyclovir  (ZOVIRAX ) 200 MG capsule Take 400 mg by mouth daily. Reported on 07/23/2015     albuterol (PROVENTIL HFA;VENTOLIN HFA) 108 (90 Base) MCG/ACT inhaler Inhale 2 puffs into the lungs every 4 (four) hours as needed for wheezing or shortness of breath.     atorvastatin (LIPITOR) 20 MG tablet Take 40 mg by mouth daily.     AUVELITY 45-105 MG TBCR Take by mouth in the morning and at bedtime.     Blood Glucose Monitoring Suppl (ACCU-CHEK GUIDE) w/Device KIT USE TO CHECK SUGAR     budesonide -formoterol  (SYMBICORT ) 80-4.5 MCG/ACT inhaler Inhale 2 puffs into the lungs in the morning and at  bedtime. 1 each 5   busPIRone (BUSPAR) 10 MG tablet Take 10 mg by mouth 3 (three) times daily as needed.     cetirizine  (ZYRTEC ) 10 MG tablet Take 1 tablet (10 mg total) by mouth daily. 90 tablet 1   Continuous Glucose Receiver (DEXCOM G7 RECEIVER) DEVI Use to monitor BG continuously 1 each 0   Continuous Glucose Transmitter (DEXCOM G6 TRANSMITTER) MISC Use to monitor glucose continuously as instructed. Change transmitter every 90 days 1 each 1   dicyclomine  (BENTYL ) 10 MG capsule Take 1 capsule (10 mg total) by mouth 3 (three) times daily as needed for spasms. 60 capsule 1   famotidine  (PEPCID ) 20 MG tablet TAKE 1 TABLET BY MOUTH EVERYDAY AT BEDTIME 60 tablet 2   gabapentin  (NEURONTIN ) 300 MG capsule Take 300-600 mg by mouth See admin instructions. Take 1 capsule (300 mg) by mouth in the morning & take 2 capsules (600 mg) by mouth at night  6   hydrochlorothiazide  (HYDRODIURIL ) 25 MG tablet TAKE 1 TABLET BY MOUTH EVERY DAY 90 tablet 0   INGREZZA 40 MG capsule Take 40 mg by mouth daily.     insulin  lispro (HUMALOG ) 100 UNIT/ML KwikPen ADMINISTER 8 TO 11 UNITS UNDER  THE SKIN THREE TIMES DAILY BEFORE MEALS 15 mL 2   lamoTRIgine (LAMICTAL) 100 MG tablet Take 100 mg by mouth at bedtime.     LANTUS  SOLOSTAR 100 UNIT/ML Solostar Pen Inject 24 Units into the skin at bedtime. (Patient taking differently: Inject 28 Units into the skin at bedtime.) 15 mL 2   losartan  (COZAAR ) 50 MG tablet TAKE 1 TABLET BY MOUTH EVERY DAY 90 tablet 1   mesalamine  (CANASA ) 1000 MG suppository Place 1 suppository (1,000 mg total) rectally at bedtime. (Patient not taking: Reported on 08/24/2023) 90 suppository 1   Mesalamine  800 MG TBEC TAKE 2 TABLETS BY MOUTH IN THE MORNING, AT NOON, AND AT BEDTIME 180 tablet 5   ondansetron  (ZOFRAN -ODT) 8 MG disintegrating tablet Take 8 mg by mouth every 8 (eight) hours as needed.     pantoprazole  (PROTONIX ) 40 MG tablet TAKE 1 TABLET(40 MG) BY MOUTH DAILY 30 tablet 3   triamcinolone  cream  (KENALOG ) 0.1 % Apply 1 Application topically 2 (two) times daily. Use for two weeks at a time to affected areas. 30 g 2   No current facility-administered medications for this visit.    ALLERGIES:  Allergies  Allergen Reactions   Ace Inhibitors Itching   Peanut Oil Itching   Shellfish Allergy  Itching and Rash   Statins Itching   Adhesive [Tape] Rash   Pravastatin Sodium Itching   Pedi-Pre Tape Spray [Wound Dressing Adhesive]    Shellfish-Derived Products     LABORATORY DATA:  I have reviewed the labs as listed.     Latest Ref Rng & Units 09/05/2023   10:19 AM 08/25/2023   10:47 AM 08/01/2022    9:52 AM  CBC  WBC 4.0 - 10.5 K/uL 4.2  4.2  3.8   Hemoglobin 12.0 - 15.0 g/dL 87.4  88.5  88.0   Hematocrit 36.0 - 46.0 % 39.6  35.9  37.8   Platelets 150 - 400 K/uL 161  159  164       Latest Ref Rng & Units 09/05/2023   10:19 AM 08/25/2023   10:47 AM 05/09/2023   12:00 AM  CMP  Glucose 70 - 99 mg/dL 749  865    BUN 6 - 20 mg/dL 11  19  15       Creatinine 0.44 - 1.00 mg/dL 9.34  9.30  0.9      Sodium 135 - 145 mmol/L 141  142  138      Potassium 3.5 - 5.1 mmol/L 4.0  4.1  4.0      Chloride 98 - 111 mmol/L 102  103    CO2 22 - 32 mmol/L 28  33    Calcium 8.9 - 10.3 mg/dL 8.9  9.4    Total Protein 6.5 - 8.1 g/dL 6.6  6.1    Total Bilirubin 0.0 - 1.2 mg/dL 0.8  0.4    Alkaline Phos 38 - 126 U/L 87     AST 15 - 41 U/L 22  28    ALT 0 - 44 U/L 25  31       This result is from an external source.    DIAGNOSTIC IMAGING:  I have independently reviewed the scans and discussed with the patient. No results found.   WRAP UP:  All questions were answered. The patient knows to call the clinic with any problems, questions or concerns.  Medical decision making: Moderate  Time spent on visit: I spent 20 minutes counseling the patient face to face. The  total time spent in the appointment was 30 minutes and more than 50% was on counseling.  Pleasant CHRISTELLA Barefoot, PA-C  09/13/23 4:01 PM

## 2023-09-13 ENCOUNTER — Inpatient Hospital Stay: Admitting: Physician Assistant

## 2023-09-13 ENCOUNTER — Other Ambulatory Visit (HOSPITAL_COMMUNITY): Payer: Self-pay | Admitting: Physician Assistant

## 2023-09-13 ENCOUNTER — Other Ambulatory Visit: Payer: Self-pay | Admitting: "Endocrinology

## 2023-09-13 VITALS — BP 126/80 | HR 85 | Temp 97.7°F | Resp 18 | Ht 61.0 in | Wt 198.0 lb

## 2023-09-13 DIAGNOSIS — E538 Deficiency of other specified B group vitamins: Secondary | ICD-10-CM | POA: Diagnosis not present

## 2023-09-13 DIAGNOSIS — C50411 Malignant neoplasm of upper-outer quadrant of right female breast: Secondary | ICD-10-CM | POA: Diagnosis not present

## 2023-09-13 DIAGNOSIS — Z1231 Encounter for screening mammogram for malignant neoplasm of breast: Secondary | ICD-10-CM | POA: Diagnosis not present

## 2023-09-13 DIAGNOSIS — Z171 Estrogen receptor negative status [ER-]: Secondary | ICD-10-CM

## 2023-09-13 DIAGNOSIS — D500 Iron deficiency anemia secondary to blood loss (chronic): Secondary | ICD-10-CM | POA: Diagnosis not present

## 2023-09-13 DIAGNOSIS — N6459 Other signs and symptoms in breast: Secondary | ICD-10-CM

## 2023-09-13 NOTE — Patient Instructions (Addendum)
 [facility] at [facility] Discharge Instructions  You were seen today by Pleasant Barefoot PA-C for the following conditions.  HISTORY OF BREAST CANCER:  Your labs and most recent mammogram were normal. You had a small area of thickened tissue in your right breast, just below your nipple.  We will check a diagnostic mammogram and ultrasound to make sure that this is nothing to be concerned about. Otherwise, you will be due for another mammogram in August 2026  IRON DEFICIENCY: Your iron, B12, Vitamin D , and blood levels look good.  You do not need any IV iron at this time.  We will check your levels again at your follow-up visit in 1 year.  FOLLOW-UP APPOINTMENT: Office visit in 1 year, after labs and mammogram   - - - - - - - - - - - - - - - - - -    Thank you for choosing [facility] at [facility] to provide your oncology and hematology care.  To afford each patient quality time with our provider, please arrive at least 15 minutes before your scheduled appointment time.   If you have a lab appointment with the Cancer Center please come in thru the Main Entrance and check in at the main information desk.  You need to re-schedule your appointment should you arrive 10 or more minutes late.  We strive to give you quality time with our providers, and arriving late affects you and other patients whose appointments are after yours.  Also, if you no show three or more times for appointments you may be dismissed from the clinic at the providers discretion.     Again, thank you for choosing [facility].  Our hope is that these requests will decrease the amount of time that you wait before being seen by our physicians.       _____________________________________________________________  Should you have questions after your visit to [facility], please contact our office at [phone] and follow the prompts.  Our office hours  are 8:00 a.m. and 4:30 p.m. Monday - Friday.  Please note that voicemails left after 4:00 p.m. may not be returned until the following business day.  We are closed weekends and major holidays.  You do have access to a nurse 24-7, just call the main number to the clinic [phone] and do not press any options, hold on the line and a nurse will answer the phone.    For prescription refill requests, have your pharmacy contact our office and allow 72 hours.    Due to Covid, you will need to wear a mask upon entering the hospital. If you do not have a mask, a mask will be given to you at the Main Entrance upon arrival. For doctor visits, patients may have 1 support person age 32 or older with them. For treatment visits, patients can not have anyone with them due to social distancing guidelines and our immunocompromised population.

## 2023-09-14 ENCOUNTER — Encounter: Attending: Family Medicine | Admitting: Nutrition

## 2023-09-14 VITALS — Ht 61.0 in | Wt 203.0 lb

## 2023-09-14 DIAGNOSIS — Z713 Dietary counseling and surveillance: Secondary | ICD-10-CM | POA: Insufficient documentation

## 2023-09-14 DIAGNOSIS — E1042 Type 1 diabetes mellitus with diabetic polyneuropathy: Secondary | ICD-10-CM | POA: Diagnosis present

## 2023-09-14 DIAGNOSIS — E66812 Obesity, class 2: Secondary | ICD-10-CM | POA: Diagnosis present

## 2023-09-14 DIAGNOSIS — E108 Type 1 diabetes mellitus with unspecified complications: Secondary | ICD-10-CM | POA: Insufficient documentation

## 2023-09-14 DIAGNOSIS — Z6835 Body mass index (BMI) 35.0-35.9, adult: Secondary | ICD-10-CM | POA: Insufficient documentation

## 2023-09-14 NOTE — Patient Instructions (Addendum)
 Goals  Eat 30 -45 grams of carbs per meal Eat meals on time Increase lower carb vegggiees Drink only water  Walk 30 minutes 4 days per week Get TIR 75%

## 2023-09-14 NOTE — Progress Notes (Signed)
 Medical Nutrition Therapy  Appointment Start time:  1055 am  Appointment End time:  1130  Primary concerns today: Dm Type 1,   Referral diagnosis: e10.42 Preferred learning style: NO preference. Learning readiness: Ready   NUTRITION ASSESSMENT Dm Follow up 53 yr old bfemale referred for Type 1 DM. PCP Dr. Toribio GAD test verifies Type 1 DM. Currenlty on Lantus  28 units a day and Humalog  8-11 units with meals. Has a Dexcom CGM. Sees Dr. Lenis, Endocrinology.  Trying to stick to work on meal times. Still has a lot struggles with eating meals on time. Forgets to take her meal time insulin  when she is away from home. Sees Dr.Nida soon. TIR 34%, 3%  low; 27% high and 33% Very HIGH.  Needs to be more consistent with taking insulin  before meals, eating meals on time and eating 30-45 grams of carbs per meals.    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     acetaminophen  (TYLENOL ) 500 MG tablet Take 1,000 mg by mouth every 6 (six) hours as needed. For pain     acyclovir  (ZOVIRAX ) 200 MG capsule Take 400 mg by mouth daily. Reported on 07/23/2015     albuterol (PROVENTIL HFA;VENTOLIN HFA) 108 (90  Base) MCG/ACT inhaler Inhale 2 puffs into the lungs every 4 (four) hours as needed for wheezing or shortness of breath.     atorvastatin (LIPITOR) 20 MG tablet Take 40 mg by mouth daily.     AUVELITY 45-105 MG TBCR Take by mouth in the morning and at bedtime.     Blood Glucose Monitoring Suppl (ACCU-CHEK GUIDE) w/Device KIT USE TO CHECK SUGAR     budesonide -formoterol  (SYMBICORT ) 80-4.5 MCG/ACT inhaler Inhale 2 puffs into the lungs in the morning and at bedtime. 1 each 5   busPIRone (BUSPAR) 10 MG tablet Take 10 mg by mouth 3 (three) times daily as needed.     cetirizine  (ZYRTEC ) 10 MG tablet Take 1 tablet (10 mg total) by mouth daily. 90 tablet 1   Continuous Glucose Receiver (DEXCOM G7 RECEIVER) DEVI Use to monitor BG continuously 1 each 0   Continuous Glucose Sensor (DEXCOM G7 SENSOR) MISC CHANGE SENSOR EVERY 10 DAYS 3 each 2   Continuous Glucose Transmitter (DEXCOM G6 TRANSMITTER) MISC Use to monitor glucose continuously as instructed. Change transmitter every 90 days 1 each 1   dicyclomine  (BENTYL ) 10 MG capsule Take 1 capsule (10 mg total) by mouth 3 (three) times daily as needed for spasms. 60 capsule 1   famotidine  (PEPCID ) 20 MG tablet TAKE 1 TABLET BY MOUTH EVERYDAY  AT BEDTIME 60 tablet 2   gabapentin  (NEURONTIN ) 300 MG capsule Take 300-600 mg by mouth See admin instructions. Take 1 capsule (300 mg) by mouth in the morning & take 2 capsules (600 mg) by mouth at night  6   hydrochlorothiazide  (HYDRODIURIL ) 25 MG tablet TAKE 1 TABLET BY MOUTH EVERY DAY 90 tablet 0   INGREZZA 40 MG capsule Take 40 mg by mouth daily.     insulin  lispro (HUMALOG ) 100 UNIT/ML KwikPen ADMINISTER 8 TO 11 UNITS UNDER THE SKIN THREE TIMES DAILY BEFORE MEALS 15 mL 2   lamoTRIgine (LAMICTAL) 100 MG tablet Take 100 mg by mouth at bedtime.     LANTUS  SOLOSTAR 100 UNIT/ML Solostar Pen Inject 24 Units into the skin at bedtime. (Patient taking differently: Inject 28 Units into the skin at bedtime.) 15 mL 2   losartan   (COZAAR ) 50 MG tablet TAKE 1 TABLET BY MOUTH EVERY DAY 90 tablet 1   mesalamine  (CANASA ) 1000 MG suppository Place 1 suppository (1,000 mg total) rectally at bedtime. 90 suppository 1   Mesalamine  800 MG TBEC TAKE 2 TABLETS BY MOUTH IN THE MORNING, AT NOON, AND AT BEDTIME 180 tablet 5   ondansetron  (ZOFRAN -ODT) 8 MG disintegrating tablet Take 8 mg by mouth every 8 (eight) hours as needed.     pantoprazole  (PROTONIX ) 40 MG tablet TAKE 1 TABLET(40 MG) BY MOUTH DAILY 30 tablet 3   triamcinolone  cream (KENALOG ) 0.1 % Apply 1 Application topically 2 (two) times daily. Use for two weeks at a time to affected areas. 30 g 2   No current facility-administered medications on file prior to visit.    Labs:  Lab Results  Component Value Date   HGBA1C 9.3 05/09/2023    Notable Signs/Symptoms: Increased thirsty, fatigue, increased urination  Lifestyle & Dietary Hx LIves with her daughter Not working  Eats meals at home and away from home.  Estimated daily fluid intake: 30-45 oz Supplements: none Sleep:  5-6 hrs. Stress / self-care: Has a lot of stress Current average weekly physical activity: Not much.  24-Hr Dietary Recall First Meal: 10 am Bluberry muffin and strawberry yoplait Snack:  Second Meal: skipped; not hungry Snack: Carbonated sparking water - Apple juice due to low blood sugar   6-7 pmChocolate vanilla pudding Cheetos Third Meal: 10 pm [place] cheddar broccoli rice, sauted chicken, onions/ peppers with alfredo sauce, water  Snack:  Beverages: water  and sparkling water    Estimated Energy Needs Calories: 1200 Carbohydrate: 135g Protein: 90g Fat: 33g   NUTRITION DIAGNOSIS  NB-1.1 Food and nutrition-related knowledge deficit As related to inconsistent CHO intake.  As evidenced by A1C 9.3%.SABRA   NUTRITION INTERVENTION  Nutrition education (E-1) on the following topics:  Nutrition and Diabetes education provided on My Plate, CHO counting, meal planning, portion sizes, timing  of meals, avoiding snacks between meals unless having a low blood sugar, target ranges for A1C and blood sugars, signs/symptoms and treatment of hyper/hypoglycemia, monitoring blood sugars, taking medications as prescribed, benefits of exercising 30 minutes per day and prevention of complications of DM.  Lifestyle Medicine  - Whole Food, Plant Predominant Nutrition is highly recommended: Eat Plenty of vegetables, Mushrooms, fruits, Legumes, Whole Grains, Nuts, seeds in lieu of processed meats, processed snacks/pastries red meat, poultry, eggs.    -It is better to avoid simple carbohydrates including: Cakes, Sweet Desserts, Ice Cream, Soda (diet and regular), Sweet Tea, Candies, Chips, Cookies, Store Bought Juices, Alcohol in Excess of  1-2 drinks a day, Lemonade,  Artificial Sweeteners, Doughnuts,  Coffee Creamers, Sugar-free Products, etc, etc.  This is not a complete list.....  Exercise: If you are able: 30 -60 minutes a day ,4 days a week, or 150 minutes a week.  The longer the better.  Combine stretch, strength, and aerobic activities.  If you were told in the past that you have high risk for cardiovascular diseases, you may seek evaluation by your heart doctor prior to initiating moderate to intense exercise programs.   Handouts Provided Include  Know your numbers S/s of hyper/hypoglycemia Lifestyle Medicine  Learning Style & Readiness for Change Teaching method utilized: Visual & Auditory  Demonstrated degree of understanding via: Teach Back  Barriers to learning/adherence to lifestyle change: stress level  Goals Established by Pt Goals  Eat 30 -45 grams of carbs per meal Eat meals on time Increase lower carb vegggiees Drink only water  Walk 30 minutes 4 days per week Get TIR 75%  MONITORING & EVALUATION Dietary intake, weekly physical activity, and blood sugars in 1 week.  Next Steps  Patient is to work on eating meals consistently at times discussed and take medication as  prescribed.SABRA

## 2023-09-19 ENCOUNTER — Ambulatory Visit: Admitting: "Endocrinology

## 2023-09-19 ENCOUNTER — Encounter: Payer: Self-pay | Admitting: "Endocrinology

## 2023-09-19 VITALS — BP 128/80 | HR 60 | Ht 61.0 in | Wt 207.2 lb

## 2023-09-19 DIAGNOSIS — Z794 Long term (current) use of insulin: Secondary | ICD-10-CM | POA: Diagnosis not present

## 2023-09-19 DIAGNOSIS — E782 Mixed hyperlipidemia: Secondary | ICD-10-CM | POA: Diagnosis not present

## 2023-09-19 DIAGNOSIS — E1065 Type 1 diabetes mellitus with hyperglycemia: Secondary | ICD-10-CM | POA: Diagnosis not present

## 2023-09-19 DIAGNOSIS — Z6835 Body mass index (BMI) 35.0-35.9, adult: Secondary | ICD-10-CM

## 2023-09-19 DIAGNOSIS — E66812 Obesity, class 2: Secondary | ICD-10-CM

## 2023-09-19 LAB — POCT GLYCOSYLATED HEMOGLOBIN (HGB A1C): HbA1c, POC (controlled diabetic range): 8.1 % — AB (ref 0.0–7.0)

## 2023-09-19 MED ORDER — LANTUS SOLOSTAR 100 UNIT/ML ~~LOC~~ SOPN: 30.0000 [IU] | PEN_INJECTOR | Freq: Every day | SUBCUTANEOUS | 2 refills | Status: DC

## 2023-09-19 NOTE — Progress Notes (Signed)
 09/19/2023, 12:13 PM     Endocrinology follow-up note  Subjective:    Patient ID: Brittney [name], female    DOB: Jul 30, 1970.  Brittney [name] is being seen in follow-up in the management of currently uncontrolled symptomatic type 1 diabetes, hypertension, hyperlipidemia. PMD: Toribio Jerel MATSU, MD.   Past Medical History:  Diagnosis Date   Anxiety    Asthma    Breast cancer 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) [facility])     Past Surgical History:  Procedure Laterality Date   BIOPSY  12/05/2018   Procedure: BIOPSY;  Surgeon: Golda Claudis PENNER, MD;  [facility];  Service: Endoscopy;;  duodenum gastric colon   BIOPSY  03/16/2022   Procedure: BIOPSY;  Surgeon: Eartha Angelia Toribio, MD;  [facility];  Service: Gastroenterology;;   BIOPSY  01/24/2023   Procedure: BIOPSY;  Surgeon: Eartha Angelia, Toribio, MD;  [facility];  Service: Gastroenterology;;   BREAST BIOPSY Left 2016   FIBROCYSTIC CHANGES WITH ASSOCIATED CALCIFICATIONS, FIBROADENOMATOID CHANGES   BREAST LUMPECTOMY Right 2017   CESAREAN SECTION  2007   COLONOSCOPY N/A 10/09/2013   Procedure: COLONOSCOPY;  Surgeon: Claudis PENNER Golda, MD;  [facility];  Service: Endoscopy;  Laterality: N/A;  100   COLONOSCOPY N/A 12/05/2018   Procedure: COLONOSCOPY;  Surgeon: Golda Claudis PENNER, MD;  [facility];  Service:  Endoscopy;  Laterality: N/A;   COLONOSCOPY WITH PROPOFOL  N/A 03/16/2022   Procedure: COLONOSCOPY WITH PROPOFOL ;  Surgeon: Eartha Angelia Toribio, MD;  [facility];  Service: Gastroenterology;  Laterality: N/A;  830am, asa 1-2   DILATION AND CURETTAGE OF UTERUS     x 2 for miscarriages   ESOPHAGEAL DILATION N/A 01/24/2023   Procedure: ESOPHAGEAL DILATION;  Surgeon: Eartha Angelia Toribio, MD;  [facility];  Service: Gastroenterology;  Laterality: N/A;  9:00am;asa 2   ESOPHAGOGASTRODUODENOSCOPY     ESOPHAGOGASTRODUODENOSCOPY N/A 12/05/2018   Procedure: ESOPHAGOGASTRODUODENOSCOPY (EGD);  Surgeon: Golda Claudis PENNER, MD;  [facility];  Service: Endoscopy;  Laterality: N/A;  200pm   ESOPHAGOGASTRODUODENOSCOPY (EGD) WITH PROPOFOL  N/A 01/24/2023   Procedure: ESOPHAGOGASTRODUODENOSCOPY (EGD) WITH PROPOFOL ;  Surgeon: Eartha Angelia Toribio, MD;  [facility];  Service: Gastroenterology;  Laterality: N/A;  9:00am;asa 2   FLEXIBLE SIGMOIDOSCOPY     FLEXIBLE SIGMOIDOSCOPY  07/27/2011   Procedure: FLEXIBLE SIGMOIDOSCOPY;  Surgeon: Claudis PENNER Golda, MD;  [facility];  Service: Endoscopy;  Laterality: N/A;  100    Social History   Socioeconomic History  Marital status: Single    Spouse name: Not on file   Number of children: Not on file   Years of education: Not on file   Highest education level: Not on file  Occupational History   Not on file  Tobacco Use   Smoking status: Never    Passive exposure: Current   Smokeless tobacco: Never  Vaping Use   Vaping status: Never Used  Substance and Sexual Activity   Alcohol use: No    Alcohol/week: 0.0 standard drinks of alcohol   Drug use: No   Sexual activity: Not on file  Other Topics Concern   Not on file  Social History Narrative   Not on file   Social Drivers of Health   Financial Resource Strain: Low Risk  (12/12/2019)   Overall Financial Resource Strain (CARDIA)     Difficulty of Paying Living Expenses: Not hard at all  Food Insecurity: No Food Insecurity (08/03/2023)   Received from [facility]   Hunger Vital Sign    Within the past 12 months, you worried that your food would run out before you got the money to buy more.: Never true    Within the past 12 months, the food you bought just didn't last and you didn't have money to get more.: Never true  Transportation Needs: No Transportation Needs (08/03/2023)   Received from [facility]   PRAPARE - Transportation    Lack of Transportation (Medical): No    Lack of Transportation (Non-Medical): No  Physical Activity: Inactive (08/03/2023)   Received from [facility]   Exercise Vital Sign    On average, how many days per week do you engage in moderate to strenuous exercise (like a brisk walk)?: 0 days    On average, how many minutes do you engage in exercise at this level?: 0 min  Stress: Stress Concern Present (08/03/2023)   Received from [facility] - Occupational Stress Questionnaire    Do you feel stress - tense, restless, nervous, or anxious, or unable to sleep at night because your mind is troubled all the time - these days?: Very much  Social Connections: Moderately Integrated (12/12/2019)   Social Connection and Isolation Panel    Frequency of Communication with Friends and Family: More than three times a week    Frequency of Social Gatherings with Friends and Family: Three times a week    Attends Religious Services: 1 to 4 times per year    Active Member of Clubs or Organizations: No    Attends Engineer, structural: 1 to 4 times per year    Marital Status: Never married    Family History  Problem Relation Age of Onset   Eczema Mother    Non-Hodgkin's lymphoma Mother    Hypertension Mother    Diabetes Mellitus II Mother    Allergic rhinitis Father    Hypertension Father    Diabetes Mellitus II Father    Breast cancer  Maternal Aunt    Colon cancer Maternal Uncle    Breast cancer Cousin     Outpatient Encounter Medications as of 09/19/2023  Medication Sig   ACCU-CHEK AVIVA PLUS test strip SMARTSIG:Via Meter 6 Times Daily   Accu-Chek Softclix Lancets lancets 4 (four) times daily.   acetaminophen  (TYLENOL ) 500 MG tablet Take 1,000 mg by mouth every 6 (six) hours as needed. For pain   acyclovir  (ZOVIRAX ) 200 MG capsule Take 400 mg by  mouth daily. Reported on 07/23/2015   albuterol (PROVENTIL HFA;VENTOLIN HFA) 108 (90 Base) MCG/ACT inhaler Inhale 2 puffs into the lungs every 4 (four) hours as needed for wheezing or shortness of breath.   atorvastatin (LIPITOR) 20 MG tablet Take 40 mg by mouth daily.   AUVELITY 45-105 MG TBCR Take by mouth in the morning and at bedtime.   Blood Glucose Monitoring Suppl (ACCU-CHEK GUIDE) w/Device KIT USE TO CHECK SUGAR   budesonide -formoterol  (SYMBICORT ) 80-4.5 MCG/ACT inhaler Inhale 2 puffs into the lungs in the morning and at bedtime.   busPIRone (BUSPAR) 10 MG tablet Take 10 mg by mouth 3 (three) times daily as needed.   cetirizine  (ZYRTEC ) 10 MG tablet Take 1 tablet (10 mg total) by mouth daily.   Continuous Glucose Receiver (DEXCOM G7 RECEIVER) DEVI Use to monitor BG continuously   Continuous Glucose Sensor (DEXCOM G7 SENSOR) MISC CHANGE SENSOR EVERY 10 DAYS   Continuous Glucose Transmitter (DEXCOM G6 TRANSMITTER) MISC Use to monitor glucose continuously as instructed. Change transmitter every 90 days   dicyclomine  (BENTYL ) 10 MG capsule Take 1 capsule (10 mg total) by mouth 3 (three) times daily as needed for spasms.   famotidine  (PEPCID ) 20 MG tablet TAKE 1 TABLET BY MOUTH EVERYDAY AT BEDTIME   gabapentin  (NEURONTIN ) 300 MG capsule Take 300-600 mg by mouth See admin instructions. Take 1 capsule (300 mg) by mouth in the morning & take 2 capsules (600 mg) by mouth at night   hydrochlorothiazide  (HYDRODIURIL ) 25 MG tablet TAKE 1 TABLET BY MOUTH EVERY DAY   INGREZZA 40 MG  capsule Take 40 mg by mouth daily.   insulin  lispro (HUMALOG ) 100 UNIT/ML KwikPen ADMINISTER 8 TO 11 UNITS UNDER THE SKIN THREE TIMES DAILY BEFORE MEALS   lamoTRIgine (LAMICTAL) 100 MG tablet Take 100 mg by mouth at bedtime.   LANTUS  SOLOSTAR 100 UNIT/ML Solostar Pen Inject 30 Units into the skin at bedtime.   losartan  (COZAAR ) 50 MG tablet TAKE 1 TABLET BY MOUTH EVERY DAY   mesalamine  (CANASA ) 1000 MG suppository Place 1 suppository (1,000 mg total) rectally at bedtime.   Mesalamine  800 MG TBEC TAKE 2 TABLETS BY MOUTH IN THE MORNING, AT NOON, AND AT BEDTIME   ondansetron  (ZOFRAN -ODT) 8 MG disintegrating tablet Take 8 mg by mouth every 8 (eight) hours as needed.   pantoprazole  (PROTONIX ) 40 MG tablet TAKE 1 TABLET(40 MG) BY MOUTH DAILY   triamcinolone  cream (KENALOG ) 0.1 % Apply 1 Application topically 2 (two) times daily. Use for two weeks at a time to affected areas.   [DISCONTINUED] LANTUS  SOLOSTAR 100 UNIT/ML Solostar Pen Inject 24 Units into the skin at bedtime. (Patient taking differently: Inject 28 Units into the skin at bedtime.)   No facility-administered encounter medications on file as of 09/19/2023.    ALLERGIES: Allergies  Allergen Reactions   Ace Inhibitors Itching   Peanut Oil Itching   Shellfish Allergy  Itching and Rash   Statins Itching   Adhesive [Tape] Rash   Pravastatin Sodium Itching   Pedi-Pre Tape Spray [Wound Dressing Adhesive]    Shellfish-Derived Products     VACCINATION STATUS: Immunization History  Administered Date(s) Administered   Influenza,inj,Quad PF,6+ Mos 10/22/2015   Influenza-Unspecified 10/16/2019   Pneumococcal Polysaccharide-23 01/03/2006   Td 10/04/2006   Tdap 07/10/1996    Diabetes She presents for her follow-up diabetic visit. She has type 1 diabetes mellitus. Onset time: She was diagnosed at approximate age of 17 years. Her disease course has been improving. Pertinent negatives for hypoglycemia  include no confusion,  nervousness/anxiousness, pallor or seizures. Pertinent negatives for diabetes include no chest pain, no fatigue, no polydipsia, no polyphagia and no polyuria. There are no hypoglycemic complications. Symptoms are improving. There are no diabetic complications. Risk factors for coronary artery disease include diabetes mellitus, dyslipidemia, obesity and sedentary lifestyle. Current diabetic treatment includes insulin  injections. Her weight is increasing steadily. She is following a generally unhealthy diet. When asked about meal planning, she reported none. She has not had a previous visit with a dietitian. Her home blood glucose trend is fluctuating minimally. Her breakfast blood glucose range is generally 180-200 mg/dl. Her lunch blood glucose range is generally 180-200 mg/dl. Her dinner blood glucose range is generally 180-200 mg/dl. Her bedtime blood glucose range is generally 180-200 mg/dl. Her overall blood glucose range is 180-200 mg/dl. (Brittney [name] presents with improved glycemic profile.  Her Dexcom analysis shows 37% time in range, 28% mid 1 hyperglycemia, 32% Libre to hyperglycemia.  She has 2% maybe 1 hypoglycemic.  Her average blood glucose is 206 for the most recent 14 days.  Her point-of-care A1c is 8.1% improving from 9.3% during her last visit.     ) An ACE inhibitor/angiotensin II receptor blocker is not being taken.  Hyperlipidemia This is a chronic problem. The current episode started more than 1 year ago. The problem is controlled. Pertinent negatives include no chest pain, myalgias or shortness of breath. Current antihyperlipidemic treatment includes statins (She was treated with atorvastatin in the past, did not continue on this medication complaining about cost.). Risk factors for coronary artery disease include diabetes mellitus, dyslipidemia, obesity and family history.    Review of systems  Constitutional: + Minimally fluctuating body weight,  current  Body mass index is 39.15  kg/m. , no fatigue, no subjective hyperthermia, no subjective hypothermia    Objective:    BP 128/80   Pulse 60   Ht 5' 1 (1.549 m)   Wt 207 lb 3.2 oz (94 kg)   BMI 39.15 kg/m   Wt Readings from Last 3 Encounters:  09/19/23 207 lb 3.2 oz (94 kg)  09/14/23 203 lb (92.1 kg)  09/13/23 198 lb (89.8 kg)     Physical Exam- Limited  Constitutional:  Body mass index is 39.15 kg/m. , not in acute distress, normal state of mind   Recent Results (from the past 2160 hours)  C-reactive protein     Status: None   Collection Time: 07/21/23 10:00 AM  Result Value Ref Range   CRP <3.0 <8.0 mg/L  CALPROTECTIN     Status: Abnormal   Collection Time: 07/24/23 11:35 AM  Result Value Ref Range   Calprotectin 405 (H) mcg/g    Comment:                                       Reference Range:                                       <50     Normal                                       50-120  Borderline                                       >  120    Elevated . Calprotectin in Crohn's disease and ulcerative colitis can be five to several thousand times above the reference population (50 mcg/g or less). Levels are usually 50 mcg/g or less in healthy patients and with irritable bowel syndrome. Repeat testing in 4-6 weeks is suggested for borderline values.   Allergen Profile, Shellfish     Status: None   Collection Time: 08/18/23 10:42 AM  Result Value Ref Range   F023-IgE Crab <0.10 Class 0 kU/L   Shrimp IgE <0.10 Class 0 kU/L   F080-IgE Lobster <0.10 Class 0 kU/L   Clam IgE <0.10 Class 0 kU/L   F290-IgE Oyster <0.10 Class 0 kU/L   Scallop IgE <0.10 Class 0 kU/L  IgE Nut Prof. w/Component Rflx     Status: None   Collection Time: 08/18/23 10:42 AM  Result Value Ref Range   Class Description Allergens Comment     Comment:     Levels of Specific IgE       Class  Description of Class     ---------------------------  -----  --------------------                    < 0.10         0          Negative            0.10 -    0.31         0/I       Equivocal/Low            0.32 -    0.55         I         Low            0.56 -    1.40         II        Moderate            1.41 -    3.90         III       High            3.91 -   19.00         IV        Very High           19.01 -  100.00         V         Very High                   >100.00         VI        Very High    F017-IgE Hazelnut ([place]) <0.10 Class 0 kU/L   F256-IgE Walnut <0.10 Class 0 kU/L   F202-IgE Cashew Nut <0.10 Class 0 kU/L   F018-IgE [place] Nut <0.10 Class 0 kU/L   Peanut, IgE <0.10 Class 0 kU/L   Macadamia Nut, IgE <0.10 Class 0 kU/L   Pecan Nut IgE <0.10 Class 0 kU/L   F203-IgE Pistachio Nut <0.10 Class 0 kU/L   F020-IgE Almond <0.10 Class 0 kU/L  Allergen Sunflower Seed k84     Status: None   Collection Time: 08/18/23 10:42 AM  Result Value Ref Range   Sunflower Seed k84 <0.10 Class 0 kU/L  CBC with Differential/Platelet     Status: Abnormal   Collection Time: 08/25/23 10:47 AM  Result Value Ref Range  WBC 4.2 3.8 - 10.8 Thousand/uL   RBC 4.06 3.80 - 5.10 Million/uL   Hemoglobin 11.4 (L) 11.7 - 15.5 g/dL   HCT 64.0 64.9 - 54.9 %   MCV 88.4 80.0 - 100.0 fL   MCH 28.1 27.0 - 33.0 pg   MCHC 31.8 (L) 32.0 - 36.0 g/dL    Comment: For adults, a slight decrease in the calculated MCHC value (in the range of 30 to 32 g/dL) is most likely not clinically significant; however, it should be interpreted with caution in correlation with other red cell parameters and the patient's clinical condition.    RDW 13.1 11.0 - 15.0 %   Platelets 159 140 - 400 Thousand/uL   MPV 11.6 7.5 - 12.5 fL   Neutro Abs 2,591 1,500 - 7,800 cells/uL   Absolute Lymphocytes 1,268 850 - 3,900 cells/uL   Absolute Monocytes 332 200 - 950 cells/uL   Eosinophils Absolute 0 (L) 15 - 500 cells/uL   Basophils Absolute 8 0 - 200 cells/uL   Neutrophils Relative % 61.7 %   Total Lymphocyte 30.2 %   Monocytes Relative 7.9 %    Eosinophils Relative 0.0 %   Basophils Relative 0.2 %  Comprehensive metabolic panel with GFR     Status: Abnormal   Collection Time: 08/25/23 10:47 AM  Result Value Ref Range   Glucose, Bld 134 (H) 65 - 99 mg/dL    Comment: .            Fasting reference interval . For someone without known diabetes, a glucose value >125 mg/dL indicates that they may have diabetes and this should be confirmed with a follow-up test. .    BUN 19 7 - 25 mg/dL   Creat 9.30 9.49 - 8.96 mg/dL   eGFR 894 > OR = 60 fO/fpw/8.26f7   BUN/Creatinine Ratio SEE NOTE: 6 - 22 (calc)    Comment:    Not Reported: BUN and Creatinine are within    reference range. .    Sodium 142 135 - 146 mmol/L   Potassium 4.1 3.5 - 5.3 mmol/L   Chloride 103 98 - 110 mmol/L   CO2 33 (H) 20 - 32 mmol/L   Calcium 9.4 8.6 - 10.4 mg/dL   Total Protein 6.1 6.1 - 8.1 g/dL   Albumin 4.2 3.6 - 5.1 g/dL   Globulin 1.9 1.9 - 3.7 g/dL (calc)   AG Ratio 2.2 1.0 - 2.5 (calc)   Total Bilirubin 0.4 0.2 - 1.2 mg/dL   Alkaline phosphatase (APISO) 90 37 - 153 U/L   AST 28 10 - 35 U/L   ALT 31 (H) 6 - 29 U/L  C-reactive protein     Status: None   Collection Time: 08/25/23 10:47 AM  Result Value Ref Range   CRP <3.0 <8.0 mg/L  QuantiFERON-TB Gold Plus     Status: None   Collection Time: 08/25/23 10:47 AM  Result Value Ref Range   QuantiFERON-TB Gold Plus NEGATIVE NEGATIVE    Comment: Negative test result. M. tuberculosis complex  infection unlikely.    NIL 0.05 IU/mL   Mitogen-NIL 6.72 IU/mL   TB1-NIL <0.00 IU/mL   TB2-NIL <0.00 IU/mL    Comment: . The Nil tube value reflects the background interferon gamma immune response of the patient's blood sample. This value has been subtracted from the patient's displayed TB and Mitogen results. . Lower than expected results with the Mitogen tube prevent false-negative Quantiferon readings by detecting a patient with a potential immune  suppressive condition and/or suboptimal  pre-analytical specimen handling. . The TB1 Antigen tube is coated with the M. tuberculosis-specific antigens designed to elicit responses from TB antigen primed CD4+ helper T-lymphocytes. . The TB2 Antigen tube is coated with the M. tuberculosis-specific antigens designed to elicit responses from TB antigen primed CD4+ helper and CD8+ cytotoxic T-lymphocytes. . For additional information, please refer to https://education.[facility].com/faq/FAQ204 (This link is being provided for informational/ educational purposes only.) .   Hepatitis B Surface AntiGEN     Status: None   Collection Time: 08/25/23 10:47 AM  Result Value Ref Range   Hepatitis B Surface Ag NON-REACTIVE NON-REACTIVE    Comment: . For additional information, please refer to  http://education.[facility].com/faq/FAQ202  (This link is being provided for informational/ educational purposes only.) .   Vitamin D  25 hydroxy     Status: None   Collection Time: 09/05/23 10:19 AM  Result Value Ref Range   Vit D, 25-Hydroxy 33.63 30 - 100 ng/mL    Comment: (NOTE) Vitamin D  deficiency has been defined by the Institute of Medicine  and an Endocrine Society practice guideline as a level of serum 25-OH  vitamin D  less than 20 ng/mL (1,2). The Endocrine Society went on to  further define vitamin D  insufficiency as a level between 21 and 29  ng/mL (2).  1. IOM (Institute of Medicine). 2010. Dietary reference intakes for  calcium and D. [place]: The [employer]. 2. Holick MF, [place]ink[place], Bischoff-Ferrari HA, et al. Evaluation,  treatment, and prevention of vitamin D  deficiency: an Endocrine  Society clinical practice guideline, JCEM. 2011 Jul; 96(7): 1911-30.  Performed at [facility], [address], [place], [place] [address]   Methylmalonic acid, serum     Status: None   Collection Time: 09/05/23 10:19 AM  Result Value Ref Range   Methylmalonic Acid, Quantitative 84 0 - 378  nmol/L    Comment: (NOTE) This test was developed and its performance characteristics determined by [facility]. It has not been cleared or approved by the Food and Drug Administration. Performed At: [facility] [address] [place], [place] [id number] Jennette Shorter MD Ey:[id number]   Vitamin B12     Status: None   Collection Time: 09/05/23 10:19 AM  Result Value Ref Range   Vitamin B-12 425 180 - 914 pg/mL    Comment: (NOTE) This assay is not validated for testing neonatal or myeloproliferative syndrome specimens for Vitamin B12 levels. Performed at [facility], [address] [place], [place] [address]   Iron and TIBC [facility] ONLY)     Status: None   Collection Time: 09/05/23 10:19 AM  Result Value Ref Range   Iron 59 28 - 170 ug/dL   TIBC 641 749 - 549 ug/dL   Saturation Ratios 17 10.4 - 31.8 %   UIBC 299 ug/dL    Comment: Performed at [facility], [address] [place], [place] [address]  Ferritin     Status: None   Collection Time: 09/05/23 10:19 AM  Result Value Ref Range   Ferritin 117 11 - 307 ng/mL    Comment: Performed at [facility], [address], [place], [place] [address]  Comprehensive metabolic panel     Status: Abnormal   Collection Time: 09/05/23 10:19 AM  Result Value Ref Range   Sodium 141 135 - 145 mmol/L   Potassium 4.0 3.5 - 5.1 mmol/L   Chloride 102 98 - 111 mmol/L   CO2 28 22 - 32 mmol/L  Glucose, Bld 250 (H) 70 - 99 mg/dL    Comment: Glucose reference range applies only to samples taken after fasting for at least 8 hours.   BUN 11 6 - 20 mg/dL   Creatinine, Ser 9.34 0.44 - 1.00 mg/dL   Calcium 8.9 8.9 - 89.6 mg/dL   Total Protein 6.6 6.5 - 8.1 g/dL   Albumin 3.8 3.5 - 5.0 g/dL   AST 22 15 - 41 U/L   ALT 25 0 - 44 U/L   Alkaline Phosphatase 87 38 - 126 U/L   Total Bilirubin 0.8 0.0 - 1.2 mg/dL   GFR, Estimated >39 >39 mL/min    Comment: (NOTE) Calculated using the CKD-EPI Creatinine Equation (2021)     Anion gap 11 5 - 15    Comment: Performed at [facility], [address], [place], [place] [address]  CBC with Differential     Status: None   Collection Time: 09/05/23 10:19 AM  Result Value Ref Range   WBC 4.2 4.0 - 10.5 K/uL   RBC 4.43 3.87 - 5.11 MIL/uL   Hemoglobin 12.5 12.0 - 15.0 g/dL   HCT 60.3 63.9 - 53.9 %   MCV 89.4 80.0 - 100.0 fL   MCH 28.2 26.0 - 34.0 pg   MCHC 31.6 30.0 - 36.0 g/dL   RDW 85.8 88.4 - 84.4 %   Platelets 161 150 - 400 K/uL   nRBC 0.0 0.0 - 0.2 %   Neutrophils Relative % 57 %   Neutro Abs 2.4 1.7 - 7.7 K/uL   Lymphocytes Relative 36 %   Lymphs Abs 1.5 0.7 - 4.0 K/uL   Monocytes Relative 7 %   Monocytes Absolute 0.3 0.1 - 1.0 K/uL   Eosinophils Relative 0 %   Eosinophils Absolute 0.0 0.0 - 0.5 K/uL   Basophils Relative 0 %   Basophils Absolute 0.0 0.0 - 0.1 K/uL   Immature Granulocytes 0 %   Abs Immature Granulocytes 0.01 0.00 - 0.07 K/uL    Comment: Performed at [facility], [address] [place], [place] [address]  HgB A1c     Status: Abnormal   Collection Time: 09/19/23 11:21 AM  Result Value Ref Range   Hemoglobin A1C     HbA1c POC (<> result, manual entry)     HbA1c, POC (prediabetic range)     HbA1c, POC (controlled diabetic range) 8.1 (A) 0.0 - 7.0 %     Diabetic Labs (most recent): Lab Results  Component Value Date   HGBA1C 8.1 (A) 09/19/2023   HGBA1C 9.3 05/09/2023   HGBA1C 8.7 (A) 12/08/2022   MICROALBUR 80 06/09/2020   MICROALBUR 2.7 12/02/2019   MICROALBUR 2.3 05/23/2018     Lipid Panel ( most recent) Lipid Panel     Component Value Date/Time   CHOL 166 05/09/2023 0000   CHOL 161 04/10/2023 1029   TRIG 52 05/09/2023 0000   HDL 71 (A) 05/09/2023 0000   HDL 68 04/10/2023 1029   CHOLHDL 2.4 04/10/2023 1029   CHOLHDL 2.3 08/29/2019 0942   LDLCALC 85 05/09/2023 0000   LDLCALC 83 04/10/2023 1029   LDLCALC 73 08/29/2019 0942      Latest Ref Rng & Units 09/05/2023   10:19 AM 08/25/2023   10:47 AM 05/09/2023   12:00 AM   CMP  Glucose 70 - 99 mg/dL 749  865    BUN 6 - 20 mg/dL 11  19  15       Creatinine 0.44 - 1.00 mg/dL 9.34  0.69  0.9      Sodium 135 - 145 mmol/L 141  142  138      Potassium 3.5 - 5.1 mmol/L 4.0  4.1  4.0      Chloride 98 - 111 mmol/L 102  103    CO2 22 - 32 mmol/L 28  33    Calcium 8.9 - 10.3 mg/dL 8.9  9.4    Total Protein 6.5 - 8.1 g/dL 6.6  6.1    Total Bilirubin 0.0 - 1.2 mg/dL 0.8  0.4    Alkaline Phos 38 - 126 U/L 87     AST 15 - 41 U/L 22  28    ALT 0 - 44 U/L 25  31       This result is from an external source.     Assessment & Plan:   1. Uncontrolled type 1 diabetes mellitus with hyperglycemia (HCC)  - Brittney [name] has currently uncontrolled symptomatic type 2 DM since 53 years of age.  Ms. Record presents with improved glycemic profile.  Her Dexcom analysis shows 37% time in range, 28% mid 1 hyperglycemia, 32% Libre to hyperglycemia.  She has 2% maybe 1 hypoglycemic.  Her average blood glucose is 206 for the most recent 14 days.  Her point-of-care A1c is 8.1% improving from 9.3% during her last visit.       Her recent anti-GAD antibody elevated at 61 suggesting type 1 diabetes.   -her diabetes is complicated by obesity/sedentary life and she remains at a high risk for more acute and chronic complications which include CAD, CVA, CKD, retinopathy, and neuropathy. These are all discussed in detail with her.  - I have counseled her on diet management and weight loss, by adopting a carbohydrate restricted/protein rich diet.  - she did not engage optimally, however she acknowledges that there is a room for improvement in her food and drink choices. - Suggestion is made for her to avoid simple carbohydrates  from her diet including Cakes, Sweet Desserts, Ice Cream, Soda (diet and regular), Sweet Tea, Candies, Chips, Cookies, Store Bought Juices, Alcohol , Artificial Sweeteners,  Coffee Creamer, and Sugar-free Products, Lemonade. This will help patient to have more  stable blood glucose profile and potentially avoid unintended weight gain.  - I encouraged her to switch to  unprocessed or minimally processed complex starch and increased protein intake (mostly plant source), fruits, and vegetables.  - she is advised to stick to a routine mealtimes to eat 3 meals  a day and avoid unnecessary snacks ( to snack only to correct hypoglycemia).    - I have approached her with the following individualized plan to manage diabetes and patient agrees:   -She will continue to need intensive treatment with basal/bolus insulin  in order for her to achieve and maintain control of diabetes to target.     -Based on her presentation with significant hyperglycemia and history of hypoglycemia unawareness, her insulin  will be adjusted carefully.  She is encouraged to continue to utilize her CGM, will order Dexcom G7 for her.    -She is advised to increase Lantus  to 30 units nightly, continue Humalog  at 8-11 units for pre-meal blood glucose readings above 80 mg/day.  -  she is allowed to use tight scale  sliding scale Humalog  for  hyperglycemia above 150 mg per DL.  She is advised to continue monitoring blood glucose 4 times a day-before meals and at bedtime. - she is encouraged to call clinic for blood glucose  levels less than 70 or above 200 mg /dl. - she is warned not to take insulin  without proper monitoring per orders. - Adjustment parameters are given to her for hypo and hyperglycemia in writing.  - she is not a suitable candidate  for metformin, SGLT2 inhibitors, nor incretin therapy.   - Patient specific target  A1c;  LDL, HDL, Triglycerides, were discussed in detail.  2) Blood Pressure /Hypertension:  Her blood pressure is controlled to target.  She has urine microalbuminuria.  She is currently  on losartan  50 mg daily.  I discussed and added hydrochlorothiazide  25 mg p.o. daily at breakfast.   3) Lipids/Hyperlipidemia: Review of her most recent lipid panel showed  controlled LDL at 84.6, overall improvement from 113.  She is advised to continue atorvastatin 20 mg p.o. nightly.     Side effects and precautions discussed with her.        4)  Weight/Diet: Her BMI is 39.15--  clearly complicating her diabetes care.  A candidate for modest weight loss, may avoid further weight gain by avoiding unnecessary snacks.  I discussed with her the fact that loss of 5 - 10% of her  current body weight will have the most impact on her diabetes management.   CDE Consult will be initiated . Exercise, and detailed carbohydrates information provided  -  detailed on discharge instructions.  5) Chronic Care/Health Maintenance:  -she  Is not on ACEI/ARB and Statin medications and  is encouraged to initiate and continue to follow up with Ophthalmology, Dentist,  Podiatrist at least yearly or according to recommendations, and advised to   stay away from smoking. I have recommended yearly flu vaccine and pneumonia vaccine at least every 5 years; moderate intensity exercise for up to 150 minutes weekly; and  sleep for at least 7 hours a day.  - she is  advised to maintain close follow up with Toribio Jerel MATSU, MD for primary care needs, as well as her other providers for optimal and coordinated care.   I spent  26  minutes in the care of the patient today including review of labs from CMP, Lipids, Thyroid Function, Hematology (current and previous including abstractions from other facilities); face-to-face time discussing  her blood glucose readings/logs, discussing hypoglycemia and hyperglycemia episodes and symptoms, medications doses, her options of short and long term treatment based on the latest standards of care / guidelines;  discussion about incorporating lifestyle medicine;  and documenting the encounter. Risk reduction counseling performed per USPSTF guidelines to reduce  obesity and cardiovascular risk factors.     Please refer to Patient Instructions for Blood Glucose  Monitoring and Insulin /Medications Dosing Guide  in media tab for additional information. Please  also refer to  Patient Self Inventory in the Media  tab for reviewed elements of pertinent patient history.  Chuckie GORMAN Pinal participated in the discussions, expressed understanding, and voiced agreement with the above plans.  All questions were answered to her satisfaction. she is encouraged to contact clinic should she have any questions or concerns prior to her return visit.     Follow up plan: - Return in about 4 months (around 01/19/2024) for Bring Meter/CGM Device/Logs- A1c in Office.  Ranny Earl, MD [facility] [address] [place], [place] [address] Phone: [phone]  Fax: [phone]    09/19/2023, 12:13 PM  This note was partially dictated with voice recognition software. Similar sounding words can be transcribed inadequately or may not  be corrected upon review.

## 2023-09-19 NOTE — Patient Instructions (Signed)

## 2023-09-21 ENCOUNTER — Telehealth: Payer: Self-pay | Admitting: Nutrition

## 2023-09-21 ENCOUNTER — Encounter: Payer: Self-pay | Admitting: Nutrition

## 2023-09-21 NOTE — Telephone Encounter (Signed)
 TC to patient to discuss readings form her Dexcom that is showing numerous episodes of hypoglycemia over night and sometimes between meals. Her BS during conversation was 61 mg/dl and she noted she was going to eat a piece of fruit to bring it up. Reviewed hypoglycemia signs and symptoms and treatment. She verbalized understanding. Advised to drink 4 oz of juice, tea, soda and wait 15 minutes and if BS is above 70 mg/dl to eat some protein source and 15 grams of carbs. She verbalized understanding. Stressed need to not skip meals or insulin .  Upon diet recall, it appears she is not eating enough CHO at meals- only 15 grams contributing to hypoglycemia. Ate pancake, sausage and 1/2 banana this am. BS went to 290 mg/dl and she gave herself 12 units instead of 11 per recommendations.  Dinner meals for the last 2 nights have been meatloaf, green beans, broccoli and 1/2 c baked beans. Her blood sugars have dropped below 70's in the morning due to insuffient CHO with those meals. Reviewed need to eat 30-45 grams of carbs at each meal. Reviewed some meal ideas to get in 30-45 grams of carbs.  Talked to Dr. Lenis and he suggested to reduce her Lantus  to 26 units  from 30 units to prevent hypoglycemia episodes. She has hypoglycemia unawareness.  She verbalized understanding to reduce Lantus  to 26 units at night.  Advised her to call Dr. Barbette office if glucose is below 70 mg/dl2-3 times in 1 day or 2-3 times per week for medication adjustments. She verbalized understanding.

## 2023-09-22 ENCOUNTER — Other Ambulatory Visit: Payer: Self-pay | Admitting: "Endocrinology

## 2023-09-27 ENCOUNTER — Ambulatory Visit (HOSPITAL_COMMUNITY): Admitting: Anesthesiology

## 2023-09-27 ENCOUNTER — Other Ambulatory Visit: Payer: Self-pay

## 2023-09-27 ENCOUNTER — Encounter (HOSPITAL_COMMUNITY)
Admission: RE | Disposition: A | Discharge status: Home / Self Care | Payer: Self-pay | Source: Home / Self Care | Attending: Gastroenterology

## 2023-09-27 ENCOUNTER — Encounter (HOSPITAL_COMMUNITY): Payer: Self-pay | Admitting: Gastroenterology

## 2023-09-27 ENCOUNTER — Ambulatory Visit (HOSPITAL_COMMUNITY)
Admission: RE | Admit: 2023-09-27 | Discharge: 2023-09-27 | Disposition: A | Discharge status: Home / Self Care | Attending: Gastroenterology | Admitting: Gastroenterology

## 2023-09-27 DIAGNOSIS — K51211 Ulcerative (chronic) proctitis with rectal bleeding: Secondary | ICD-10-CM | POA: Insufficient documentation

## 2023-09-27 DIAGNOSIS — K5190 Ulcerative colitis, unspecified, without complications: Secondary | ICD-10-CM | POA: Diagnosis not present

## 2023-09-27 DIAGNOSIS — E109 Type 1 diabetes mellitus without complications: Secondary | ICD-10-CM | POA: Diagnosis not present

## 2023-09-27 DIAGNOSIS — Z923 Personal history of irradiation: Secondary | ICD-10-CM | POA: Insufficient documentation

## 2023-09-27 DIAGNOSIS — T451X5A Adverse effect of antineoplastic and immunosuppressive drugs, initial encounter: Secondary | ICD-10-CM | POA: Diagnosis not present

## 2023-09-27 DIAGNOSIS — D701 Agranulocytosis secondary to cancer chemotherapy: Secondary | ICD-10-CM | POA: Insufficient documentation

## 2023-09-27 DIAGNOSIS — K625 Hemorrhage of anus and rectum: Secondary | ICD-10-CM | POA: Insufficient documentation

## 2023-09-27 DIAGNOSIS — D500 Iron deficiency anemia secondary to blood loss (chronic): Secondary | ICD-10-CM | POA: Diagnosis not present

## 2023-09-27 DIAGNOSIS — J45909 Unspecified asthma, uncomplicated: Secondary | ICD-10-CM | POA: Diagnosis not present

## 2023-09-27 DIAGNOSIS — Z853 Personal history of malignant neoplasm of breast: Secondary | ICD-10-CM | POA: Diagnosis not present

## 2023-09-27 DIAGNOSIS — I10 Essential (primary) hypertension: Secondary | ICD-10-CM | POA: Diagnosis not present

## 2023-09-27 DIAGNOSIS — K219 Gastro-esophageal reflux disease without esophagitis: Secondary | ICD-10-CM | POA: Diagnosis not present

## 2023-09-27 DIAGNOSIS — Z9221 Personal history of antineoplastic chemotherapy: Secondary | ICD-10-CM | POA: Insufficient documentation

## 2023-09-27 HISTORY — PX: FLEXIBLE SIGMOIDOSCOPY: SHX5431

## 2023-09-27 HISTORY — DX: Unspecified osteoarthritis, unspecified site: M19.90

## 2023-09-27 LAB — GLUCOSE, CAPILLARY: Glucose-Capillary: 254 mg/dL — ABNORMAL HIGH (ref 70–99)

## 2023-09-27 SURGERY — SIGMOIDOSCOPY, FLEXIBLE
Anesthesia: General

## 2023-09-27 MED ORDER — LACTATED RINGERS IV SOLN: INTRAVENOUS | Status: DC | PRN

## 2023-09-27 MED ORDER — PROPOFOL 10 MG/ML IV BOLUS
INTRAVENOUS | Status: DC | PRN
  Administered 2023-09-27: 100 mg via INTRAVENOUS

## 2023-09-27 MED ORDER — HYDROCORTISONE ACETATE 25 MG RE SUPP: 25.0000 mg | Freq: Two times a day (BID) | RECTAL | 1 refills | Status: AC

## 2023-09-27 MED ORDER — PROPOFOL 500 MG/50ML IV EMUL
INTRAVENOUS | Status: DC | PRN
  Administered 2023-09-27: 200 ug/kg/min via INTRAVENOUS

## 2023-09-27 NOTE — Discharge Instructions (Addendum)
 You are being discharged to home.  Resume your previous diet.  We are waiting for your pathology results.  Continue your present medications.  Please let us  know about your decision to switch to Velsipity, Entyvio, Tremfya, Skirizi, Stelara . Use Anusol  suppositories twice a day for 1 week.

## 2023-09-27 NOTE — Op Note (Addendum)
 [facility] Patient Name: Brittney [name] Procedure Date: 09/27/2023 1:44 PM MRN: [id number] Date of Birth: [birth year] Attending MD: Toribio Fortune , , [id number] CSN: [id number] Age: 53 Admit Type: Outpatient Procedure:                Flexible Sigmoidoscopy Indications:              Rectal hemorrhage, Personal history of ulcerative                            colitis Providers:                Toribio Fortune, Crystal Page, Jon Loge Referring MD:              Medicines:                Monitored Anesthesia Care Complications:            No immediate complications. Estimated Blood Loss:     Estimated blood loss: none. Procedure:                Pre-Anesthesia Assessment:                           - Prior to the procedure, a History and Physical                            was performed, and patient medications, allergies                            and sensitivities were reviewed. The patient's                            tolerance of previous anesthesia was reviewed.                           - The risks and benefits of the procedure and the                            sedation options and risks were discussed with the                            patient. All questions were answered and informed                            consent was obtained.                           - ASA Grade Assessment: II - A patient with mild                            systemic disease.                           After obtaining informed consent, the scope was                            passed under direct vision. The [id number] ([id number])  Upper was introduced through the anus and advanced                            to the 40 cm from the anal verge. The flexible                            sigmoidoscopy was accomplished without difficulty.                            The patient tolerated the procedure well. The                            quality of the bowel preparation was  adequate. Scope In: 1:57:45 PM Scope Out: 2:00:58 PM Total Procedure Duration: 0 hours 3 minutes 13 seconds  Findings:      Inflammation was found in a continuous and circumferential pattern from       the anus to the rectum up to 9 cm from anal verge. This was graded as       Mayo Score 2 (moderate, with marked erythema, absent vascular pattern,       friability, erosions), and when compared to the previous examination,       the findings are unchanged. Biopsies were taken with a cold forceps for       histology.      Proximal to this area, there was no presence of inflammation (Mayo 0). Impression:               - Moderately active (Mayo Score 2) proctitis                            ulcerative colitis, unchanged since the last                            examination. Biopsied. Moderate Sedation:      Per Anesthesia Care Recommendation:           - Discharge patient to home (ambulatory).                           - Resume previous diet.                           - Await pathology results.                           - Continue present medications.                           - Please let us  know about your decision to switch                            to Velsipity, Entyvio, Tremfya, Skirizi, Stelara .                           - Use Anusol  suppositories twice a day for 1 week. Procedure Code(s):        --- Professional ---  54668, Sigmoidoscopy, flexible; with biopsy, single                            or multiple Diagnosis Code(s):        --- Professional ---                           K51.211, Ulcerative (chronic) proctitis with rectal                            bleeding                           K62.5, Hemorrhage of anus and rectum                           Z87.19, Personal history of other diseases of the                            digestive system CPT copyright 2022 American Medical Association. All rights reserved. The codes documented in this report are  preliminary and upon coder review may  be revised to meet current compliance requirements. Toribio Fortune, MD Toribio Fortune,  09/27/2023 2:08:00 PM This report has been signed electronically. Number of Addenda: 0

## 2023-09-27 NOTE — H&P (Signed)
 Zamariya KAMREE WIENS is an 53 y.o. female.   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  Patient reports having rectal bleeding with bowel movements.  Having 1-2 bowel movements are soft in consistency.  No abdominal pain.  No nausea, vomiting, fever, chills, abdominal distention.  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) [facility])     Past Surgical History:  Procedure Laterality Date   BIOPSY  12/05/2018   Procedure: BIOPSY;  Surgeon: Golda Claudis PENNER, MD;  [facility];  Service: Endoscopy;;  duodenum gastric colon   BIOPSY  03/16/2022   Procedure: BIOPSY;  Surgeon: Eartha Angelia Sieving, MD;  [facility];  Service: Gastroenterology;;   BIOPSY  01/24/2023   Procedure: BIOPSY;  Surgeon: Eartha Angelia, Sieving, MD;  [facility];  Service: Gastroenterology;;   BREAST BIOPSY Left 2016   FIBROCYSTIC CHANGES WITH ASSOCIATED CALCIFICATIONS, FIBROADENOMATOID CHANGES   BREAST LUMPECTOMY Right 2017   CESAREAN SECTION  2007   COLONOSCOPY N/A 10/09/2013   Procedure: COLONOSCOPY;  Surgeon: Claudis PENNER Golda, MD;  [facility];  Service: Endoscopy;  Laterality: N/A;  100   COLONOSCOPY N/A 12/05/2018   Procedure: COLONOSCOPY;  Surgeon: Golda Claudis PENNER, MD;   [facility];  Service: Endoscopy;  Laterality: N/A;   COLONOSCOPY WITH PROPOFOL  N/A 03/16/2022   Procedure: COLONOSCOPY WITH PROPOFOL ;  Surgeon: Eartha Angelia Sieving, MD;  [facility];  Service: Gastroenterology;  Laterality: N/A;  830am, asa 1-2   DILATION AND CURETTAGE OF UTERUS     x 2 for miscarriages   ESOPHAGEAL DILATION N/A 01/24/2023   Procedure: ESOPHAGEAL DILATION;  Surgeon: Eartha Angelia Sieving, MD;  [facility];  Service: Gastroenterology;  Laterality: N/A;  9:00am;asa 2   ESOPHAGOGASTRODUODENOSCOPY     ESOPHAGOGASTRODUODENOSCOPY N/A 12/05/2018   Procedure: ESOPHAGOGASTRODUODENOSCOPY (EGD);  Surgeon: Golda Claudis PENNER, MD;  [facility];  Service: Endoscopy;  Laterality: N/A;  200pm   ESOPHAGOGASTRODUODENOSCOPY (EGD) WITH PROPOFOL  N/A 01/24/2023   Procedure: ESOPHAGOGASTRODUODENOSCOPY (EGD) WITH PROPOFOL ;  Surgeon: Eartha Angelia Sieving, MD;  [facility];  Service: Gastroenterology;  Laterality: N/A;  9:00am;asa 2   FLEXIBLE SIGMOIDOSCOPY     FLEXIBLE SIGMOIDOSCOPY  07/27/2011   Procedure: FLEXIBLE SIGMOIDOSCOPY;  Surgeon: Claudis PENNER Golda, MD;  [facility];  Service: Endoscopy;  Laterality: N/A;  100    Family History  Problem Relation Age of Onset   Eczema Mother    Non-Hodgkin's lymphoma Mother    Hypertension Mother    Diabetes Mellitus II Mother    Allergic rhinitis Father    Hypertension Father    Diabetes Mellitus II Father    Breast cancer Maternal Aunt  Colon cancer Maternal Uncle    Breast cancer Cousin    Social History:  reports that she has never smoked. She has been exposed to tobacco smoke. She has never used smokeless tobacco. She reports that she does not drink alcohol and does not use drugs.  Allergies:  Allergies  Allergen Reactions   Ace Inhibitors Itching   Peanut Oil Itching   Shellfish Allergy  Itching and Rash   Statins Itching   Adhesive [Tape] Rash    Pravastatin Sodium Itching   Pedi-Pre Tape Spray [Wound Dressing Adhesive]    Shellfish-Derived Products     Medications Prior to Admission  Medication Sig Dispense Refill   acetaminophen  (TYLENOL ) 500 MG tablet Take 1,000 mg by mouth every 6 (six) hours as needed. For pain     acyclovir  (ZOVIRAX ) 200 MG capsule Take 400 mg by mouth daily. Reported on 07/23/2015     atorvastatin (LIPITOR) 20 MG tablet Take 40 mg by mouth daily.     AUVELITY 45-105 MG TBCR Take by mouth in the morning and at bedtime.     cetirizine  (ZYRTEC ) 10 MG tablet Take 1 tablet (10 mg total) by mouth daily. 90 tablet 1   gabapentin  (NEURONTIN ) 300 MG capsule Take 300-600 mg by mouth See admin instructions. Take 1 capsule (300 mg) by mouth in the morning & take 2 capsules (600 mg) by mouth at night  6   hydrochlorothiazide  (HYDRODIURIL ) 25 MG tablet TAKE 1 TABLET BY MOUTH EVERY DAY 90 tablet 0   INGREZZA 40 MG capsule Take 40 mg by mouth daily.     insulin  lispro (HUMALOG ) 100 UNIT/ML KwikPen ADMINISTER 8 TO 11 UNITS UNDER THE SKIN THREE TIMES DAILY BEFORE MEALS 15 mL 2   lamoTRIgine (LAMICTAL) 100 MG tablet Take 100 mg by mouth at bedtime.     LANTUS  SOLOSTAR 100 UNIT/ML Solostar Pen Inject 30 Units into the skin at bedtime. 15 mL 2   losartan  (COZAAR ) 50 MG tablet TAKE 1 TABLET BY MOUTH EVERY DAY 90 tablet 1   pantoprazole  (PROTONIX ) 40 MG tablet TAKE 1 TABLET(40 MG) BY MOUTH DAILY 30 tablet 3   ACCU-CHEK AVIVA PLUS test strip SMARTSIG:Via Meter 6 Times Daily     Accu-Chek Softclix Lancets lancets 4 (four) times daily.     albuterol (PROVENTIL HFA;VENTOLIN HFA) 108 (90 Base) MCG/ACT inhaler Inhale 2 puffs into the lungs every 4 (four) hours as needed for wheezing or shortness of breath.     Blood Glucose Monitoring Suppl (ACCU-CHEK GUIDE) w/Device KIT USE TO CHECK SUGAR     budesonide -formoterol  (SYMBICORT ) 80-4.5 MCG/ACT inhaler Inhale 2 puffs into the lungs in the morning and at bedtime. 1 each 5   busPIRone (BUSPAR)  10 MG tablet Take 10 mg by mouth 3 (three) times daily as needed.     Continuous Glucose Receiver (DEXCOM G7 RECEIVER) DEVI Use to monitor BG continuously 1 each 0   Continuous Glucose Sensor (DEXCOM G7 SENSOR) MISC CHANGE SENSOR EVERY 10 DAYS 3 each 2   Continuous Glucose Transmitter (DEXCOM G6 TRANSMITTER) MISC Use to monitor glucose continuously as instructed. Change transmitter every 90 days 1 each 1   dicyclomine  (BENTYL ) 10 MG capsule Take 1 capsule (10 mg total) by mouth 3 (three) times daily as needed for spasms. 60 capsule 1   famotidine  (PEPCID ) 20 MG tablet TAKE 1 TABLET BY MOUTH EVERYDAY AT BEDTIME 60 tablet 2   mesalamine  (CANASA ) 1000 MG suppository Place 1 suppository (1,000 mg total) rectally at bedtime. 90  suppository 1   Mesalamine  800 MG TBEC TAKE 2 TABLETS BY MOUTH IN THE MORNING, AT NOON, AND AT BEDTIME 180 tablet 5   ondansetron  (ZOFRAN -ODT) 8 MG disintegrating tablet Take 8 mg by mouth every 8 (eight) hours as needed.     triamcinolone  cream (KENALOG ) 0.1 % Apply 1 Application topically 2 (two) times daily. Use for two weeks at a time to affected areas. 30 g 2    No results found for this or any previous visit (from the past 48 hours). No results found.  Review of Systems  Gastrointestinal:  Positive for blood in stool and diarrhea.  All other systems reviewed and are negative.   Blood pressure (!) 163/83, pulse 66, temperature 98.4 F (36.9 C), temperature source Oral, resp. rate 14, height 5' 2 (1.575 m), weight 90.7 kg, SpO2 99%. Physical Exam  GENERAL: The patient is AO x3, in no acute distress. HEENT: Head is normocephalic and atraumatic. EOMI are intact. Mouth is well hydrated and without lesions. NECK: Supple. No masses LUNGS: Clear to auscultation. No presence of rhonchi/wheezing/rales. Adequate chest expansion HEART: RRR, normal s1 and s2. ABDOMEN: Soft, nontender, no guarding, no peritoneal signs, and nondistended. BS +. No masses. EXTREMITIES: Without  any cyanosis, clubbing, rash, lesions or edema. NEUROLOGIC: AOx3, no focal motor deficit. 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  Will proceed with flexible sigmoidoscopy.  Toribio Eartha Flavors, MD 09/27/2023, 1:10 PM

## 2023-09-27 NOTE — Transfer of Care (Signed)
 Immediate Anesthesia Transfer of Care Note  Patient: Brittney [name]  Procedure(s) Performed: KINGSTON SIDE  Patient Location: Endoscopy Unit  Anesthesia Type:General  Level of Consciousness: awake, alert , oriented, and patient cooperative  Airway & Oxygen Therapy: Patient Spontanous Breathing  Post-op Assessment: Report given to RN, Post -op Vital signs reviewed and stable, and Patient moving all extremities X 4  Post vital signs: Reviewed and stable  Last Vitals:  Vitals Value Taken Time  BP    Temp    Pulse    Resp    SpO2      Last Pain:  Vitals:   09/27/23 1352  TempSrc:   PainSc: 0-No pain         Complications: No notable events documented.

## 2023-09-27 NOTE — Anesthesia Preprocedure Evaluation (Signed)
 Anesthesia Evaluation  Patient identified by MRN, date of birth, ID band Patient awake    Reviewed: Allergy & Precautions, H&P , NPO status , Patient's Chart, lab work & pertinent test results, reviewed documented beta blocker date and time   Airway Mallampati: II  TM Distance: >3 FB Neck ROM: full    Dental no notable dental hx.    Pulmonary neg pulmonary ROS, asthma    Pulmonary exam normal breath sounds clear to auscultation       Cardiovascular Exercise Tolerance: Good hypertension, negative cardio ROS  Rhythm:regular Rate:Normal     Neuro/Psych  Headaches PSYCHIATRIC DISORDERS Anxiety Depression    negative neurological ROS  negative psych ROS   GI/Hepatic negative GI ROS, Neg liver ROS, PUD,GERD  ,,  Endo/Other  negative endocrine ROSdiabetes    Renal/GU negative Renal ROS  negative genitourinary   Musculoskeletal   Abdominal   Peds  Hematology negative hematology ROS (+) Blood dyscrasia, anemia   Anesthesia Other Findings   Reproductive/Obstetrics negative OB ROS                             Anesthesia Physical Anesthesia Plan  ASA: 2  Anesthesia Plan: General   Post-op Pain Management:    Induction:   PONV Risk Score and Plan: Propofol infusion  Airway Management Planned:   Additional Equipment:   Intra-op Plan:   Post-operative Plan:   Informed Consent: I have reviewed the patients History and Physical, chart, labs and discussed the procedure including the risks, benefits and alternatives for the proposed anesthesia with the patient or authorized representative who has indicated his/her understanding and acceptance.     Dental Advisory Given  Plan Discussed with: CRNA  Anesthesia Plan Comments:        Anesthesia Quick Evaluation

## 2023-09-28 ENCOUNTER — Ambulatory Visit (HOSPITAL_COMMUNITY)
Admission: RE | Admit: 2023-09-28 | Discharge: 2023-09-28 | Disposition: A | Discharge status: Home / Self Care | Source: Ambulatory Visit | Attending: Physician Assistant | Admitting: Physician Assistant

## 2023-09-28 ENCOUNTER — Encounter (HOSPITAL_COMMUNITY): Payer: Self-pay | Admitting: Gastroenterology

## 2023-09-28 ENCOUNTER — Other Ambulatory Visit: Payer: Self-pay

## 2023-09-28 DIAGNOSIS — C50411 Malignant neoplasm of upper-outer quadrant of right female breast: Secondary | ICD-10-CM | POA: Insufficient documentation

## 2023-09-28 DIAGNOSIS — Z171 Estrogen receptor negative status [ER-]: Secondary | ICD-10-CM | POA: Insufficient documentation

## 2023-09-28 DIAGNOSIS — E1065 Type 1 diabetes mellitus with hyperglycemia: Secondary | ICD-10-CM

## 2023-09-28 MED ORDER — LANTUS SOLOSTAR 100 UNIT/ML ~~LOC~~ SOPN: 30.0000 [IU] | PEN_INJECTOR | Freq: Every day | SUBCUTANEOUS | 2 refills | Status: DC

## 2023-09-28 NOTE — Anesthesia Postprocedure Evaluation (Signed)
 Anesthesia Post Note  Patient: Brittney [name]  Procedure(s) Performed: KINGSTON SIDE  Patient location during evaluation: Phase II Anesthesia Type: General Level of consciousness: awake Pain management: pain level controlled Vital Signs Assessment: post-procedure vital signs reviewed and stable Respiratory status: spontaneous breathing and respiratory function stable Cardiovascular status: blood pressure returned to baseline and stable Postop Assessment: no headache and no apparent nausea or vomiting Anesthetic complications: no Comments: Late entry   No notable events documented.   Last Vitals:  Vitals:   09/27/23 1406 09/27/23 1411  BP: 121/60 135/79  Pulse: 66 70  Resp: 16 16  Temp: 36.9 C   SpO2: 97% 97%    Last Pain:  Vitals:   09/27/23 1406  TempSrc: Oral  PainSc: 0-No pain                 [name]

## 2023-09-29 LAB — SURGICAL PATHOLOGY

## 2023-10-02 ENCOUNTER — Ambulatory Visit (INDEPENDENT_AMBULATORY_CARE_PROVIDER_SITE_OTHER): Payer: Self-pay | Admitting: Gastroenterology

## 2023-10-03 NOTE — Progress Notes (Signed)
 3 yr TCS noted in recall Patient result letter mailed procedure note and pathology result faxed to PCP

## 2023-10-05 ENCOUNTER — Encounter: Payer: Self-pay | Admitting: Nutrition

## 2023-10-06 ENCOUNTER — Encounter (INDEPENDENT_AMBULATORY_CARE_PROVIDER_SITE_OTHER): Payer: Self-pay | Admitting: Gastroenterology

## 2023-10-13 ENCOUNTER — Encounter (INDEPENDENT_AMBULATORY_CARE_PROVIDER_SITE_OTHER): Payer: Self-pay | Admitting: Gastroenterology

## 2023-10-18 ENCOUNTER — Encounter (INDEPENDENT_AMBULATORY_CARE_PROVIDER_SITE_OTHER): Payer: Self-pay | Admitting: Gastroenterology

## 2023-10-20 ENCOUNTER — Other Ambulatory Visit (INDEPENDENT_AMBULATORY_CARE_PROVIDER_SITE_OTHER): Payer: Self-pay | Admitting: Gastroenterology

## 2023-10-20 ENCOUNTER — Telehealth (INDEPENDENT_AMBULATORY_CARE_PROVIDER_SITE_OTHER): Payer: Self-pay | Admitting: Gastroenterology

## 2023-10-20 NOTE — Telephone Encounter (Signed)
 Spoke with patient today, she decided to proceed with Entyvio for ulcerative colitis.  Orders have been placed for the infusion center. Thanks

## 2023-10-20 NOTE — Telephone Encounter (Signed)
 Pt called in and states that Dr.Castaneda gave her a list of medications to try for her Ulcerative Colitis. Pt states provider recommended Velsipity but pt states she really hesitant about Velsipity. Pt other choice was Entyvio.  Pt would like a call back to discuss medications. Please advise. Thank you!  [phone]

## 2023-10-23 NOTE — Telephone Encounter (Signed)
 Noted, Thanks

## 2023-10-24 ENCOUNTER — Telehealth: Payer: Self-pay

## 2023-10-24 NOTE — Telephone Encounter (Signed)
 Hello,  Patient will be scheduled as soon as possible.  Auth Submission: NO AUTH NEEDED Site of care: Site of care: [facility] Payer: [place] MEDICAID [employer]  Medication & CPT/J Code(s) submitted: Entyvio (Vedolizumab) [id number] Diagnosis Code:  Route of submission (phone, fax, portal): portal Phone # Fax # Auth type: Buy/Bill HB Units/visits requested: 300mg  initial doses, then q8weeks Reference number: [id number] Approval from: 10/24/23 to 01/03/24

## 2023-10-24 NOTE — Telephone Encounter (Signed)
 Thanks

## 2023-10-25 ENCOUNTER — Other Ambulatory Visit (HOSPITAL_COMMUNITY): Payer: Self-pay | Admitting: Physician Assistant

## 2023-10-25 DIAGNOSIS — Z1231 Encounter for screening mammogram for malignant neoplasm of breast: Secondary | ICD-10-CM

## 2023-10-26 ENCOUNTER — Telehealth: Payer: Self-pay | Admitting: Pharmacy Technician

## 2023-10-26 ENCOUNTER — Other Ambulatory Visit (HOSPITAL_COMMUNITY): Payer: Self-pay

## 2023-10-26 NOTE — Telephone Encounter (Signed)
 Pharmacy Patient Advocate Encounter   Received notification from CoverMyMeds that prior authorization for Dexcom G7 Sensor  is due for renewal.   Insurance verification completed.   The patient is insured through [employer] MEDICAID.  Action: PA required; PA submitted to above mentioned insurance via Latent Key/confirmation #/EOC [id number] Status is pending

## 2023-10-26 NOTE — Telephone Encounter (Signed)
 Pharmacy Patient Advocate Encounter  Received notification from [employer] that Prior Authorization for Dexcom G7 Sensor  has been APPROVED from 10/26/23 to 10/25/24. Unable to obtain price due to refill too soon rejection, last fill date 10/19/23 next available fill date 11/11/23   PA #/Case ID/Reference #: [id number]

## 2023-10-30 ENCOUNTER — Ambulatory Visit (INDEPENDENT_AMBULATORY_CARE_PROVIDER_SITE_OTHER): Admitting: Gastroenterology

## 2023-10-30 ENCOUNTER — Other Ambulatory Visit (INDEPENDENT_AMBULATORY_CARE_PROVIDER_SITE_OTHER): Payer: Self-pay | Admitting: Gastroenterology

## 2023-10-31 ENCOUNTER — Encounter: Attending: Gastroenterology | Admitting: Internal Medicine

## 2023-10-31 VITALS — BP 132/79 | HR 69 | Temp 98.0°F | Resp 16

## 2023-10-31 DIAGNOSIS — K51311 Ulcerative (chronic) rectosigmoiditis with rectal bleeding: Secondary | ICD-10-CM | POA: Insufficient documentation

## 2023-10-31 MED ORDER — VEDOLIZUMAB 300 MG IV SOLR
300.0000 mg | Freq: Once | INTRAVENOUS | Status: AC
  Administered 2023-10-31: 300 mg via INTRAVENOUS
  Filled 2023-10-31: qty 5

## 2023-10-31 NOTE — Progress Notes (Signed)
 Diagnosis: Ulcerative Colitis  Provider:  Eartha Sieving MD  Procedure: IV Infusion  IV Type: Peripheral, IV Location: R Antecubital  Entyvio (Vedolizumab), Dose: 300 mg  Infusion Start Time: 1016  Infusion Stop Time: 1050  Post Infusion IV Care: Observation period completed  Discharge: Condition: Good, Destination: Home . AVS Provided  Performed by:  Blanca Selinda SAUNDERS, LPN

## 2023-11-10 ENCOUNTER — Telehealth (INDEPENDENT_AMBULATORY_CARE_PROVIDER_SITE_OTHER): Payer: Self-pay

## 2023-11-10 NOTE — Telephone Encounter (Signed)
 Patient calling today to see if she is to remain on Mesalamine  after starting the Entyvio on 10/31/2023. Please advise.

## 2023-11-11 ENCOUNTER — Other Ambulatory Visit: Payer: Self-pay | Admitting: "Endocrinology

## 2023-11-11 ENCOUNTER — Other Ambulatory Visit (INDEPENDENT_AMBULATORY_CARE_PROVIDER_SITE_OTHER): Payer: Self-pay | Admitting: Gastroenterology

## 2023-11-13 NOTE — Telephone Encounter (Signed)
 I spoke with the patient and made her aware per Dr. Eartha, She should continue with oral mesalamine  for now. Will readdress discontinuing it in follow up appointment. Patient states understanding.

## 2023-11-16 ENCOUNTER — Encounter: Attending: Gastroenterology | Admitting: Internal Medicine

## 2023-11-16 VITALS — BP 119/80 | HR 73 | Temp 98.0°F | Resp 16

## 2023-11-16 DIAGNOSIS — K51311 Ulcerative (chronic) rectosigmoiditis with rectal bleeding: Secondary | ICD-10-CM | POA: Insufficient documentation

## 2023-11-16 MED ORDER — VEDOLIZUMAB 300 MG IV SOLR
300.0000 mg | Freq: Once | INTRAVENOUS | Status: AC
  Administered 2023-11-16: 300 mg via INTRAVENOUS
  Filled 2023-11-16: qty 5

## 2023-11-16 NOTE — Progress Notes (Signed)
 Diagnosis: Ulcerative Colitis  Provider:  Eartha Sieving MD  Procedure: IV Infusion  IV Type: Peripheral, IV Location: L Antecubital  Entyvio (Vedolizumab), Dose: 300 mg  Infusion Start Time: 0916  Infusion Stop Time: 0949  Post Infusion IV Care: Observation period completed  Discharge: Condition: Good, Destination: Home . AVS Declined  Performed by:  Almer Bushey R, LPN

## 2023-11-23 ENCOUNTER — Ambulatory Visit (INDEPENDENT_AMBULATORY_CARE_PROVIDER_SITE_OTHER): Admitting: Gastroenterology

## 2023-11-23 ENCOUNTER — Encounter (INDEPENDENT_AMBULATORY_CARE_PROVIDER_SITE_OTHER): Payer: Self-pay | Admitting: Gastroenterology

## 2023-11-23 ENCOUNTER — Encounter: Payer: Self-pay | Admitting: Nutrition

## 2023-11-23 VITALS — BP 127/77 | HR 73 | Temp 97.3°F | Ht 62.0 in | Wt 205.4 lb

## 2023-11-23 DIAGNOSIS — K51311 Ulcerative (chronic) rectosigmoiditis with rectal bleeding: Secondary | ICD-10-CM

## 2023-11-23 NOTE — Progress Notes (Signed)
 Toribio Fortune, M.D. Gastroenterology & Hepatology [facility] [address] [place], [place] [address]  Primary Care Physician: Toribio Jerel MATSU, MD [address] [place] [address]  I will communicate my assessment and recommendations to the referring MD via EMR.  Problems: Ulcerative proctosigmoiditis   History of Present Illness: Brittney [name] is a 53 y.o. female with past medical history of  ulcerative proctosigmoiditis, breast cancer, GERD, gastric disease, type 1 diabetes, coming for follow-up of ulcerative colitis.  The patient was last seen on 08/24/2023. At that time, the patient was scheduled for flexible sigmoidoscopy.  She was continue mesalamine  1600 mg 3 times daily.  Flexible sigmoidoscopy performed 09/27/2023.  Was found to have moderately active ( Mayo Score 2) proctitis ulcerative colitis, unchanged since the last examination. Biopsied - path Chronic active colitis with acute cryptitis and surface erosion  consistent with idiopathic inflammatory bowel disease  Negative for granulomas and dysplasia  Negative for diagnostic viral cytopathic effect   Patient was advised to use Anusol  suppositories.  Eventually she decided to start Entyvio .  She has received 2 doses so far. Believes she has seen much less blood in the stool and has not seen more mucus in the stool.  Patient has felt that prior to starting Entyvio , she was having a sensation of constipation . She is having a BM every day. States stools are sometimes soft and more recently formed. Has had pain in her lower abdomen while having a BM, but it resolves after defecation. Has tried taking sips of Miralax  every day, as if she takes the whole glass she would have an immediate BM. Has had slight nausea.  The patient denies having any vomiting, fever, hematochezia, melena, hematemesis, abdominal distention,jaundice, pruritus or weight loss.  Last flu shot:2025 Last  pneumonia shot:never Last Pap smear: 2025, normal Last zoster vaccine: never COVID-19 shot: 2021 Last time TB and hepatitis B surface antigen were checked: 08/2023  Last EGD: 01/2023- Ringed appearance mucosa in the esophagus.                           - No endoscopic esophageal abnormality to explain                            patient's dysphagia. Esophagus dilated. Dilated.                           - Multiple fundic gland polyps. Biopsied.                           - Normal examined duodenum. Biopsied.                           - Biopsies were taken with a cold forceps for                            evaluation of eosinophilic esophagitis. Last Colonoscopy: 03/2022 - The examined portion of the ileum was normal.                           - Mild (Mayo Score 1) ulcerative colitis, improved  since the last examination. Biopsied.                           - The distal rectum and anal verge are normal on                            retroflexion view. (Chronic inactive proctitis)   Repeat Colonoscopy 10 years  Past Medical History: 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) [facility])     Past Surgical History: Past Surgical History:  Procedure Laterality Date   BIOPSY  12/05/2018   Procedure: BIOPSY;  Surgeon: Golda Claudis PENNER, MD;  [facility];  Service: Endoscopy;;  duodenum gastric colon   BIOPSY   03/16/2022   Procedure: BIOPSY;  Surgeon: Eartha Angelia Sieving, MD;  [facility];  Service: Gastroenterology;;   BIOPSY  01/24/2023   Procedure: BIOPSY;  Surgeon: Eartha Angelia, Sieving, MD;  [facility];  Service: Gastroenterology;;   BREAST BIOPSY Left 2016   FIBROCYSTIC CHANGES WITH ASSOCIATED CALCIFICATIONS, FIBROADENOMATOID CHANGES   BREAST LUMPECTOMY Right 2017   CESAREAN SECTION  2007   COLONOSCOPY N/A 10/09/2013   Procedure: COLONOSCOPY;  Surgeon: Claudis PENNER Golda, MD;  [facility];  Service: Endoscopy;  Laterality: N/A;  100   COLONOSCOPY N/A 12/05/2018   Procedure: COLONOSCOPY;  Surgeon: Golda Claudis PENNER, MD;  [facility];  Service: Endoscopy;  Laterality: N/A;   COLONOSCOPY WITH PROPOFOL  N/A 03/16/2022   Procedure: COLONOSCOPY WITH PROPOFOL ;  Surgeon: Eartha Angelia Sieving, MD;  [facility];  Service: Gastroenterology;  Laterality: N/A;  830am, asa 1-2   DILATION AND CURETTAGE OF UTERUS     x 2 for miscarriages   ESOPHAGEAL DILATION N/A 01/24/2023   Procedure: ESOPHAGEAL DILATION;  Surgeon: Eartha Angelia Sieving, MD;  [facility];  Service: Gastroenterology;  Laterality: N/A;  9:00am;asa 2   ESOPHAGOGASTRODUODENOSCOPY     ESOPHAGOGASTRODUODENOSCOPY N/A 12/05/2018   Procedure: ESOPHAGOGASTRODUODENOSCOPY (EGD);  Surgeon: Golda Claudis PENNER, MD;  [facility];  Service: Endoscopy;  Laterality: N/A;  200pm   ESOPHAGOGASTRODUODENOSCOPY (EGD) WITH PROPOFOL  N/A 01/24/2023   Procedure: ESOPHAGOGASTRODUODENOSCOPY (EGD) WITH PROPOFOL ;  Surgeon: Eartha Angelia Sieving, MD;  [facility];  Service: Gastroenterology;  Laterality: N/A;  9:00am;asa 2   FLEXIBLE SIGMOIDOSCOPY     FLEXIBLE SIGMOIDOSCOPY  07/27/2011   Procedure: FLEXIBLE SIGMOIDOSCOPY;  Surgeon: Claudis PENNER Golda, MD;  [facility];  Service: Endoscopy;  Laterality: N/A;  100   FLEXIBLE SIGMOIDOSCOPY N/A 09/27/2023    Procedure: KINGSTON SIDE;  Surgeon: Eartha Angelia, Sieving, MD;  [facility];  Service: Gastroenterology;  Laterality: N/A;  12:45 pm, asa 1-2, pt does not want to come earlier  Family History: Family History  Problem Relation Age of Onset   Eczema Mother    Non-Hodgkin's lymphoma Mother    Hypertension Mother    Diabetes Mellitus II Mother    Allergic rhinitis Father    Hypertension Father    Diabetes Mellitus II Father    Breast cancer Maternal Aunt    Colon cancer Maternal Uncle    Breast cancer Cousin     Social History: Social History   Tobacco Use  Smoking Status Never   Passive exposure: Current  Smokeless Tobacco Never   Social History   Substance and Sexual Activity  Alcohol Use No   Alcohol/week: 0.0 standard drinks of alcohol   Social History   Substance and Sexual Activity  Drug Use No    Allergies: Allergies  Allergen Reactions   Ace Inhibitors Itching   Peanut Oil Itching   Shellfish Allergy  Itching and Rash   Statins Itching   Adhesive [Tape] Rash   Pravastatin Sodium Itching   Pedi-Pre Tape Spray [Wound Dressing Adhesive]    Shellfish Protein-Containing Drug Products     Medications: Current Outpatient Medications  Medication Sig Dispense Refill   ACCU-CHEK AVIVA PLUS test strip SMARTSIG:Via Meter 6 Times Daily     Accu-Chek Softclix Lancets lancets 4 (four) times daily.     acetaminophen  (TYLENOL ) 500 MG tablet Take 1,000 mg by mouth every 6 (six) hours as needed. For pain     acyclovir  (ZOVIRAX ) 200 MG capsule Take 400 mg by mouth daily. Reported on 07/23/2015     albuterol  (PROVENTIL  HFA;VENTOLIN  HFA) 108 (90 Base) MCG/ACT inhaler Inhale 2 puffs into the lungs every 4 (four) hours as needed for wheezing or shortness of breath.     atorvastatin (LIPITOR) 20 MG tablet Take 40 mg by mouth daily.     AUVELITY 45-105 MG TBCR Take by mouth in the morning and at bedtime.     Blood Glucose Monitoring Suppl (ACCU-CHEK  GUIDE) w/Device KIT USE TO CHECK SUGAR     budesonide -formoterol  (SYMBICORT ) 80-4.5 MCG/ACT inhaler Inhale 2 puffs into the lungs in the morning and at bedtime. 1 each 5   cetirizine  (ZYRTEC ) 10 MG tablet Take 1 tablet (10 mg total) by mouth daily. 90 tablet 1   Continuous Glucose Receiver (DEXCOM G7 RECEIVER) DEVI Use to monitor BG continuously 1 each 0   Continuous Glucose Sensor (DEXCOM G7 SENSOR) MISC CHANGE SENSOR EVERY 10 DAYS 3 each 2   Continuous Glucose Transmitter (DEXCOM G6 TRANSMITTER) MISC Use to monitor glucose continuously as instructed. Change transmitter every 90 days 1 each 1   dicyclomine  (BENTYL ) 10 MG capsule Take 1 capsule (10 mg total) by mouth 3 (three) times daily as needed for spasms. 60 capsule 1   famotidine  (PEPCID ) 20 MG tablet TAKE 1 TABLET BY MOUTH EVERYDAY AT BEDTIME 60 tablet 2   gabapentin  (NEURONTIN ) 300 MG capsule Take 300-600 mg by mouth See admin instructions. Take 1 capsule (300 mg) by mouth in the morning & take 2 capsules (600 mg) by mouth at night  6   hydrochlorothiazide  (HYDRODIURIL ) 25 MG tablet TAKE 1 TABLET BY MOUTH EVERY DAY 90 tablet 0   hydrocortisone  (ANUSOL -HC) 25 MG suppository Place 1 suppository (25 mg total) rectally 2 (two) times daily. (Patient taking differently: Place 25 mg rectally 2 (two) times daily. As needed.) 14 suppository 1   INGREZZA 40 MG capsule Take 40 mg by mouth daily.     insulin  lispro (HUMALOG ) 100 UNIT/ML KwikPen ADMINISTER 8 TO  11 UNITS UNDER THE SKIN THREE TIMES DAILY BEFORE MEALS 15 mL 2   lamoTRIgine (LAMICTAL) 100 MG tablet Take 100 mg by mouth at bedtime.     LANTUS  SOLOSTAR 100 UNIT/ML Solostar Pen Inject 30 Units into the skin at bedtime. 15 mL 2   losartan  (COZAAR ) 50 MG tablet TAKE 1 TABLET BY MOUTH EVERY DAY 90 tablet 1   Mesalamine  800 MG TBEC TAKE 2 TABLETS BY MOUTH IN THE MORNING, AT NOON, AND AT BEDTIME 180 tablet 5   pantoprazole  (PROTONIX ) 40 MG tablet TAKE 1 TABLET(40 MG) BY MOUTH DAILY 30 tablet 3    triamcinolone  cream (KENALOG ) 0.1 % Apply 1 Application topically 2 (two) times daily. Use for two weeks at a time to affected areas. 30 g 2   mesalamine  (CANASA ) 1000 MG suppository Place 1 suppository (1,000 mg total) rectally at bedtime. (Patient not taking: Reported on 11/23/2023) 90 suppository 1   ondansetron  (ZOFRAN -ODT) 8 MG disintegrating tablet Take 8 mg by mouth every 8 (eight) hours as needed. (Patient not taking: Reported on 11/23/2023)     No current facility-administered medications for this visit.    Review of Systems: GENERAL: negative for malaise, night sweats HEENT: No changes in hearing or vision, no nose bleeds or other nasal problems. NECK: Negative for lumps, goiter, pain and significant neck swelling RESPIRATORY: Negative for cough, wheezing CARDIOVASCULAR: Negative for chest pain, leg swelling, palpitations, orthopnea GI: SEE HPI MUSCULOSKELETAL: Negative for joint pain or swelling, back pain, and muscle pain. SKIN: Negative for lesions, rash PSYCH: Negative for sleep disturbance, mood disorder and recent psychosocial stressors. HEMATOLOGY Negative for prolonged bleeding, bruising easily, and swollen nodes. ENDOCRINE: Negative for cold or heat intolerance, polyuria, polydipsia and goiter. NEURO: negative for tremor, gait imbalance, syncope and seizures. The remainder of the review of systems is noncontributory.   Physical Exam: BP 127/77 (BP Location: Right Arm, Patient Position: Sitting, Cuff Size: Normal)   Pulse 73   Temp (!) 97.3 F (36.3 C) (Temporal)   Ht 5' 2 (1.575 m)   Wt 205 lb 6.4 oz (93.2 kg)   BMI 37.57 kg/m  GENERAL: The patient is AO x3, in no acute distress. HEENT: Head is normocephalic and atraumatic. EOMI are intact. Mouth is well hydrated and without lesions. NECK: Supple. No masses LUNGS: Clear to auscultation. No presence of rhonchi/wheezing/rales. Adequate chest expansion HEART: RRR, normal s1 and s2. ABDOMEN: Soft, nontender, no  guarding, no peritoneal signs, and nondistended. BS +. No masses. EXTREMITIES: Without any cyanosis, clubbing, rash, lesions or edema. NEUROLOGIC: AOx3, no focal motor deficit. SKIN: no jaundice, no rashes  Imaging/Labs: as above  I personally reviewed and interpreted the available labs, imaging and endoscopic files.  Impression and Plan: Brittney [name] is a 53 y.o. female with past medical history of  ulcerative proctosigmoiditis, breast cancer, GERD, gastric disease, type 1 diabetes, coming for follow-up of ulcerative colitis.  The patient was recently started on Entyvio  as her first advanced therapy for management of moderate to severe ulcerative colitis.  She has previously failed mesalamine  compounds and was presenting persistent symptoms.  She has presented some relief of her symptoms as her hematochezia and mucus production have decreased, although she is still presenting some diarrhea.  Nevertheless, she has only received 2 doses of Entyvio  and will require further dosing to determine if there is significant clinical improvement.  For now, we will finish her induction doses and continue her maintenance regimen every 8 weeks.  Will obtain surveillance labs  at this point.  In terms of her preventative measures, she is due for pneumonia and shingles vaccination.  I counseled her about the importance of obtaining these vaccinations.  -Check CBC, CMP and CRP -Continue with Entyvio  induction and maintenance regimen every 8 weeks -Obtain pneumonia and shingles vaccination  All questions were answered.      Toribio Fortune, MD Gastroenterology and Hepatology [facility] Gastroenterology

## 2023-11-23 NOTE — Patient Instructions (Signed)
 Perform blood workup Continue with Entyvio  induction and maintenance regimen every 8 weeks Obtain pneumonia and shingles vaccination

## 2023-11-24 ENCOUNTER — Other Ambulatory Visit: Payer: Self-pay

## 2023-11-24 ENCOUNTER — Ambulatory Visit (INDEPENDENT_AMBULATORY_CARE_PROVIDER_SITE_OTHER): Payer: Self-pay | Admitting: Gastroenterology

## 2023-11-24 ENCOUNTER — Ambulatory Visit: Admitting: Allergy & Immunology

## 2023-11-24 VITALS — BP 124/78 | HR 67 | Temp 98.0°F | Resp 18 | Ht 61.0 in | Wt 206.8 lb

## 2023-11-24 DIAGNOSIS — J4530 Mild persistent asthma, uncomplicated: Secondary | ICD-10-CM

## 2023-11-24 DIAGNOSIS — J3089 Other allergic rhinitis: Secondary | ICD-10-CM

## 2023-11-24 DIAGNOSIS — L299 Pruritus, unspecified: Secondary | ICD-10-CM | POA: Diagnosis not present

## 2023-11-24 DIAGNOSIS — L2089 Other atopic dermatitis: Secondary | ICD-10-CM | POA: Diagnosis not present

## 2023-11-24 DIAGNOSIS — J302 Other seasonal allergic rhinitis: Secondary | ICD-10-CM

## 2023-11-24 LAB — COMPREHENSIVE METABOLIC PANEL WITH GFR
AG Ratio: 2.2 (calc) (ref 1.0–2.5)
ALT: 23 U/L (ref 6–29)
AST: 21 U/L (ref 10–35)
Albumin: 4.3 g/dL (ref 3.6–5.1)
Alkaline phosphatase (APISO): 91 U/L (ref 37–153)
BUN: 12 mg/dL (ref 7–25)
CO2: 33 mmol/L — ABNORMAL HIGH (ref 20–32)
Calcium: 9.4 mg/dL (ref 8.6–10.4)
Chloride: 100 mmol/L (ref 98–110)
Creat: 0.65 mg/dL (ref 0.50–1.03)
Globulin: 2 g/dL (ref 1.9–3.7)
Glucose, Bld: 219 mg/dL — ABNORMAL HIGH (ref 65–99)
Potassium: 4.3 mmol/L (ref 3.5–5.3)
Sodium: 140 mmol/L (ref 135–146)
Total Bilirubin: 0.5 mg/dL (ref 0.2–1.2)
Total Protein: 6.3 g/dL (ref 6.1–8.1)
eGFR: 106 mL/min/1.73m2 (ref >=60)

## 2023-11-24 LAB — CBC WITH DIFFERENTIAL/PLATELET
Absolute Lymphocytes: 2019 {cells}/uL (ref 850–3900)
Absolute Monocytes: 519 {cells}/uL (ref 200–950)
Basophils Absolute: 0 {cells}/uL (ref 0–200)
Basophils Relative: 0 %
Eosinophils Absolute: 0 {cells}/uL — ABNORMAL LOW (ref 15–500)
Eosinophils Relative: 0 %
HCT: 38 % (ref 35.0–45.0)
Hemoglobin: 12.2 g/dL (ref 11.7–15.5)
MCH: 27.8 pg (ref 27.0–33.0)
MCHC: 32.1 g/dL (ref 32.0–36.0)
MCV: 86.6 fL (ref 80.0–100.0)
MPV: 11.1 fL (ref 7.5–12.5)
Monocytes Relative: 8.5 %
Neutro Abs: 3562 {cells}/uL (ref 1500–7800)
Neutrophils Relative %: 58.4 %
Platelets: 191 Thousand/uL (ref 140–400)
RBC: 4.39 Million/uL (ref 3.80–5.10)
RDW: 12.5 % (ref 11.0–15.0)
Total Lymphocyte: 33.1 %
WBC: 6.1 Thousand/uL (ref 3.8–10.8)

## 2023-11-24 LAB — C-REACTIVE PROTEIN: CRP: 3.2 mg/L (ref <8.0)

## 2023-11-24 MED ORDER — EPINEPHRINE 0.3 MG/0.3ML IJ SOAJ: 0.3000 mg | Freq: Once | INTRAMUSCULAR | 2 refills | Status: AC

## 2023-11-24 MED ORDER — BUDESONIDE-FORMOTEROL FUMARATE 80-4.5 MCG/ACT IN AERO: 2.0000 | INHALATION_SPRAY | Freq: Two times a day (BID) | RESPIRATORY_TRACT | 5 refills | Status: AC

## 2023-11-24 MED ORDER — CETIRIZINE HCL 10 MG PO TABS: 10.0000 mg | ORAL_TABLET | Freq: Every day | ORAL | 1 refills | Status: AC

## 2023-11-24 MED ORDER — CETIRIZINE HCL 10 MG PO TABS: 10.0000 mg | ORAL_TABLET | Freq: Every day | ORAL | 1 refills | Status: DC

## 2023-11-24 MED ORDER — ALBUTEROL SULFATE HFA 108 (90 BASE) MCG/ACT IN AERS: 2.0000 | INHALATION_SPRAY | RESPIRATORY_TRACT | 2 refills | Status: AC | PRN

## 2023-11-24 MED ORDER — TRIAMCINOLONE ACETONIDE 0.1 % EX CREA: 1.0000 | TOPICAL_CREAM | Freq: Two times a day (BID) | CUTANEOUS | 2 refills | Status: AC

## 2023-11-24 NOTE — Patient Instructions (Addendum)
 1. Chronic rhinitis - Testing in the past showed: grasses, weeds, indoor molds, and outdoor molds - Continue taking: Zyrtec  (cetirizine ) 10mg  tablet once daily and Flonase (fluticasone) one spray per nostril daily (AIM FOR EAR ON EACH SIDE) - You can use an extra dose of the antihistamine, if needed, for breakthrough symptoms.  - Consider nasal saline rinses 1-2 times daily to remove allergens from the nasal cavities as well as help with mucous clearance (this is especially helpful to do before the nasal sprays are given) - Consider allergy  shots as a means of long-term control. - Allergy  shots re-train and reset the immune system to ignore environmental allergens and decrease the resulting immune response to those allergens (sneezing, itchy watery eyes, runny nose, nasal congestion, etc).    - Allergy  shots improve symptoms in 75-85% of patients.   2. Mild persistent asthma, uncomplicated - Lung testing looked pretty good at the last visit.  - I think you need a spacer to get more of the medication into your lungs.  - We will send one into the pharmacy.  - Continue to monitor your chest tightness episodes and USE albuterol  to see if this help.  - Daily controller medication(s): Symbicort  160/4.66mcg two puffs twice daily with spacer - Prior to physical activity: albuterol  2 puffs 10-15 minutes before physical activity. - Rescue medications: albuterol  4 puffs every 4-6 hours as needed - Asthma control goals:  * Full participation in all desired activities (may need albuterol  before activity) * Albuterol  use two time or less a week on average (not counting use with activity) * Cough interfering with sleep two time or less a month * Oral steroids no more than once a year * No hospitalizations  3. Flexural atopic dermatitis - Continue with the triamcinolone  twice daily as you are doing. - Continue with the moisturizing as you are doing.   4. Food allergies (shellfish and peanut and  sunflower) - Ok to continue to avoid these foods. - We can do a challenge in the future if you are interested in pursuing this.  - EpiPen  sent in today.  5. Itching with intermittent hives (hives improved) - These seem to be resolved.  - Continue to note future outbreaks and triggers.   6. Return in about 3 months (around 02/24/2024). You can have the follow up appointment with Dr. Iva or a Nurse Practicioner (our Nurse Practitioners are excellent and always have Physician oversight!).    Please inform us  of any Emergency Department visits, hospitalizations, or changes in symptoms. Call us  before going to the ED for breathing or allergy  symptoms since we might be able to fit you in for a sick visit. Feel free to contact us  anytime with any questions, problems, or concerns.  It was a pleasure to meet you today!  Websites that have reliable patient information: 1. American Academy of Asthma, Allergy , and Immunology: www.aaaai.org 2. Food Nurse, Mental Health and Education (FARE): foodallergy.org 3. Mothers of Asthmatics: http://www.asthmacommunitynetwork.org 4. [employer] of Allergy , Asthma, and Immunology: www.acaai.org      "Like" us  on Facebook and Instagram for our latest updates!      A healthy democracy works best when [employer] participate! Make sure you are registered to vote! If you have moved or changed any of your contact information, you will need to get this updated before voting! Scan the QR codes below to learn more!           Reducing Pollen Exposure  The American Academy of  Allergy , Asthma and Immunology suggests the following steps to reduce your exposure to pollen during allergy  seasons.    Do not hang sheets or clothing out to dry; pollen may collect on these items. Do not mow lawns or spend time around freshly cut grass; mowing stirs up pollen. Keep windows closed at night.  Keep car windows closed while driving. Minimize morning activities  outdoors, a time when pollen counts are usually at their highest. Stay indoors as much as possible when pollen counts or humidity is high and on windy days when pollen tends to remain in the air longer. Use air conditioning when possible.  Many air conditioners have filters that trap the pollen spores. Use a HEPA room air filter to remove pollen form the indoor air you breathe.  Control of Mold Allergen   Mold and fungi can grow on a variety of surfaces provided certain temperature and moisture conditions exist.  Outdoor molds grow on plants, decaying vegetation and soil.  The major outdoor mold, Alternaria and Cladosporium, are found in very high numbers during hot and dry conditions.  Generally, a late Summer - Fall peak is seen for common outdoor fungal spores.  Rain will temporarily lower outdoor mold spore count, but counts rise rapidly when the rainy period ends.  The most important indoor molds are Aspergillus and Penicillium.  Dark, humid and poorly ventilated basements are ideal sites for mold growth.  The next most common sites of mold growth are the bathroom and the kitchen.  Outdoor (Seasonal) Mold Control  Positive outdoor molds via skin testing: Alternaria and Cladosporium  Use air conditioning and keep windows closed Avoid exposure to decaying vegetation. Avoid leaf raking. Avoid grain handling. Consider wearing a face mask if working in moldy areas.    Indoor (Perennial) Mold Control   Positive indoor molds via skin testing: Aspergillus, Penicillium, Fusarium, Aureobasidium (Pullulara), and Rhizopus  Maintain humidity below 50%. Clean washable surfaces with 5% bleach solution. Remove sources e.g. contaminated carpets.

## 2023-11-24 NOTE — Progress Notes (Unsigned)
 FOLLOW UP  Date of Service/Encounter:  11/24/23   Assessment:   Perennial and seasonal allergic rhinitis (grasses, weeds, indoor molds, and outdoor molds)   Mild persistent asthma, uncomplicated   Flexural atopic dermatitis   Breast cancer (diagnosed in 2017) - treated with lumpectomy, radiation and chemotherapy   Ulcerative colitis (diagnosed in 2007) - on mesalamine  daily and Enterocort for flares  Plan/Recommendations:   There are no Patient Instructions on file for this visit.   Subjective:   Brittney [name] is a 53 y.o. female presenting today for follow up of No chief complaint on file.   Brittney [name] has a history of the following: Patient Active Problem List   Diagnosis Date Noted   Lower abdominal pain 08/24/2023   Esophageal dysphagia 01/24/2023   Insulin  long-term use (HCC) 07/20/2022   Class 1 obesity due to excess calories with serious comorbidity and body mass index (BMI) of 31.0 to 31.9 in adult 07/20/2022   Belching 06/01/2022   Bloating 12/13/2021   Vitamin D  deficiency 06/22/2021   Class 2 severe obesity due to excess calories with serious comorbidity and body mass index (BMI) of 35.0 to 35.9 in adult 06/22/2021   Gastroesophageal reflux disease 10/15/2018   Uncontrolled type 1 diabetes mellitus with hyperglycemia (HCC) 06/04/2018   Uncontrolled type 2 diabetes mellitus with hyperglycemia (HCC) 03/21/2018   Neutropenic fever 09/11/2015   Chemotherapy induced neutropenia 09/10/2015   Nausea without vomiting 07/09/2015   Genetic counseling and testing 06/29/2015   Iron deficiency anemia due to chronic blood loss 06/12/2015   Malignant neoplasm of upper-outer quadrant of right female breast (HCC) 06/11/2015   Bilateral cataracts 05/12/2015   Type 2 diabetes mellitus without complication 05/12/2015   Diabetic gastroparesis (HCC) 08/26/2013   THYROID NODULE, RIGHT 04/17/2008   Ulcerative colitis (HCC) 11/01/2007   Type 1 diabetes mellitus  (HCC) 10/11/2007   Mixed hyperlipidemia 10/11/2007   ANEMIA-NOS 10/11/2007   Depression 10/11/2007   GERD 10/11/2007   Peptic ulcer 10/11/2007   Headache 10/11/2007   History of colonic polyps 10/11/2007   Personal history of urinary disorder 10/11/2007   Personal history presenting hazards to health 10/11/2007    History obtained from: chart review and patient.  Discussed the use of AI scribe software for clinical note transcription with the patient and/or guardian, who gave verbal consent to proceed.  Brittney [name] is a 53 y.o. female presenting for a follow up visit. She was last seen in August 2025.  At that time, we stopped Claritin and started Zyrtec  and Flonase.  We did talk about allergy  shots for long-term control.  For her asthma, lung testing looked pretty good.  Atopic dermatitis is controlled with triamcinolone  as well as moisturizing.  For her food allergies, she had negative testing and we obtained lab work instead.  Her itching seemed to have improved.  Labs are negative for sunflower, nut panel, and shellfish.  Since the last visit,   Asthma/Respiratory Symptom History: ***  Allergic Rhinitis Symptom History: ***  Food Allergy  Symptom History: ***  Skin Symptom History: ***  GERD Symptom History: ***  Infection Symptom History: ***  Otherwise, there have been no changes to her past medical history, surgical history, family history, or social history.    Review of systems otherwise negative other than that mentioned in the HPI.    Objective:   There were no vitals taken for this visit. There is no height or weight on file to calculate BMI.    Physical  Exam   Diagnostic studies: {Blank single:19197::none,deferred due to recent antihistamine use,deferred due to insurance stipulations that require a separate visit for testing,labs sent instead, }  Spirometry: {Blank single:19197::results normal (FEV1: ***%, FVC: ***%, FEV1/FVC: ***%),results  abnormal (FEV1: ***%, FVC: ***%, FEV1/FVC: ***%)}.    {Blank single:19197::Spirometry consistent with mild obstructive disease,Spirometry consistent with moderate obstructive disease,Spirometry consistent with severe obstructive disease,Spirometry consistent with possible restrictive disease,Spirometry consistent with mixed obstructive and restrictive disease,Spirometry uninterpretable due to technique,Spirometry consistent with normal pattern}. {Blank single:19197::Albuterol /Atrovent nebulizer,Xopenex/Atrovent nebulizer,Albuterol  nebulizer,Albuterol  four puffs via MDI,Xopenex four puffs via MDI} treatment given in clinic with {Blank single:19197::significant improvement in FEV1 per ATS criteria,significant improvement in FVC per ATS criteria,significant improvement in FEV1 and FVC per ATS criteria,improvement in FEV1, but not significant per ATS criteria,improvement in FVC, but not significant per ATS criteria,improvement in FEV1 and FVC, but not significant per ATS criteria,no improvement}.  Allergy  Studies: {Blank single:19197::none,deferred due to recent antihistamine use,deferred due to insurance stipulations that require a separate visit for testing,labs sent instead, }    {Blank single:19197::Allergy  testing results were read and interpreted by myself, documented by clinical staff., }      Marty Shaggy, MD  [facility] 

## 2023-11-27 ENCOUNTER — Encounter: Payer: Self-pay | Admitting: Allergy & Immunology

## 2023-12-05 ENCOUNTER — Other Ambulatory Visit: Payer: Self-pay | Admitting: "Endocrinology

## 2023-12-13 ENCOUNTER — Other Ambulatory Visit (INDEPENDENT_AMBULATORY_CARE_PROVIDER_SITE_OTHER): Payer: Self-pay | Admitting: Gastroenterology

## 2023-12-13 DIAGNOSIS — K51919 Ulcerative colitis, unspecified with unspecified complications: Secondary | ICD-10-CM

## 2023-12-13 DIAGNOSIS — K5130 Ulcerative (chronic) rectosigmoiditis without complications: Secondary | ICD-10-CM

## 2023-12-13 DIAGNOSIS — K5190 Ulcerative colitis, unspecified, without complications: Secondary | ICD-10-CM

## 2023-12-13 DIAGNOSIS — K5120 Ulcerative (chronic) proctitis without complications: Secondary | ICD-10-CM

## 2023-12-14 ENCOUNTER — Ambulatory Visit: Attending: Gastroenterology

## 2023-12-14 VITALS — BP 132/76 | HR 74 | Temp 98.1°F | Resp 16

## 2023-12-14 DIAGNOSIS — K51311 Ulcerative (chronic) rectosigmoiditis with rectal bleeding: Secondary | ICD-10-CM | POA: Insufficient documentation

## 2023-12-14 MED ORDER — VEDOLIZUMAB 300 MG IV SOLR
300.0000 mg | Freq: Once | INTRAVENOUS | Status: AC
  Administered 2023-12-14: 300 mg via INTRAVENOUS
  Filled 2023-12-14: qty 5

## 2023-12-14 NOTE — Progress Notes (Signed)
 Diagnosis: Ulcerative Colitis  Provider:  Eartha Sieving MD  Procedure: IV Infusion  IV Type: Peripheral, IV Location: R Antecubital  Entyvio  (Vedolizumab ), Dose: 300 mg  Infusion Start Time: 0957  Infusion Stop Time: 1032  Post Infusion IV Care: Observation period completed  Discharge: Condition: Good, Destination: Home . AVS Declined  Performed by:  Baldwin Darice Helling, RN

## 2023-12-15 ENCOUNTER — Other Ambulatory Visit: Payer: Self-pay | Admitting: "Endocrinology

## 2024-01-23 ENCOUNTER — Ambulatory Visit: Admitting: "Endocrinology

## 2024-01-23 ENCOUNTER — Encounter: Attending: Family Medicine | Admitting: Nutrition

## 2024-01-23 ENCOUNTER — Encounter: Payer: Self-pay | Admitting: "Endocrinology

## 2024-01-23 VITALS — BP 124/82 | HR 64 | Ht 62.0 in | Wt 202.0 lb

## 2024-01-23 VITALS — Ht 62.0 in | Wt 202.0 lb

## 2024-01-23 DIAGNOSIS — E108 Type 1 diabetes mellitus with unspecified complications: Secondary | ICD-10-CM | POA: Diagnosis present

## 2024-01-23 DIAGNOSIS — E559 Vitamin D deficiency, unspecified: Secondary | ICD-10-CM | POA: Diagnosis not present

## 2024-01-23 DIAGNOSIS — E1065 Type 1 diabetes mellitus with hyperglycemia: Secondary | ICD-10-CM | POA: Diagnosis not present

## 2024-01-23 DIAGNOSIS — Z6835 Body mass index (BMI) 35.0-35.9, adult: Secondary | ICD-10-CM | POA: Diagnosis not present

## 2024-01-23 DIAGNOSIS — E782 Mixed hyperlipidemia: Secondary | ICD-10-CM

## 2024-01-23 DIAGNOSIS — E66812 Obesity, class 2: Secondary | ICD-10-CM | POA: Diagnosis not present

## 2024-01-23 DIAGNOSIS — Z794 Long term (current) use of insulin: Secondary | ICD-10-CM | POA: Diagnosis not present

## 2024-01-23 LAB — POCT GLYCOSYLATED HEMOGLOBIN (HGB A1C): Hemoglobin A1C: 9.4 % — AB (ref 4.0–5.6)

## 2024-01-23 MED ORDER — LANTUS SOLOSTAR 100 UNIT/ML ~~LOC~~ SOPN: 32.0000 [IU] | PEN_INJECTOR | Freq: Every day | SUBCUTANEOUS | 2 refills | Status: AC

## 2024-01-23 NOTE — Progress Notes (Signed)
 Medical Nutrition Therapy  Appointment Start time:  1100  am  Appointment End time:  1130  Primary concerns today: Dm Type 1,   Referral diagnosis: e10.42 Preferred learning style: NO preference. Learning readiness: Ready   NUTRITION ASSESSMENT Dm Follow up Has been eating more vegetables but still inconsistent with eating meals on time, skipping meals and being consistent with CHO intake. Has been working on being more compliant with taking meal time insulin . Has some low blood sugars from skipping meals or  not eating enough CHO at meals. Sees Dr. Lenis today. A1C 9.4%. Currenlty on 30 units of Lantus .. Dexcom show  GMI 8.1%, 37% TIR, 32% high and 26% very hight. 3% low, 1% very low. Lost 4 lbs. Wt Readings from Last 3 Encounters:  01/23/24 202 lb (91.6 kg)  01/23/24 202 lb (91.6 kg)  11/24/23 206 lb 12.8 oz (93.8 kg)   Ht Readings from Last 3 Encounters:  01/23/24 5' 2 (1.575 m)  01/23/24 5' 2 (1.575 m)  11/24/23 5' 1 (1.549 m)   Body mass index is 36.95 kg/m. @BMIFA @ Facility age limit for growth %iles is 20 years. Facility age limit for growth %iles is 20 years.   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     acetaminophen  (TYLENOL ) 500 MG tablet Take 1,000 mg by mouth every 6 (six) hours as needed. For pain     acyclovir  (ZOVIRAX ) 200 MG capsule Take 400 mg by mouth daily. Reported on 07/23/2015     albuterol  (VENTOLIN  HFA) 108 (90 Base) MCG/ACT inhaler Inhale 2 puffs into the lungs every 4 (four) hours as needed for wheezing or shortness of breath. 1 each 2   atorvastatin (LIPITOR) 20 MG tablet Take 40 mg by mouth daily.     AUVELITY 45-105 MG TBCR Take by mouth in the morning and at bedtime.     Blood Glucose Monitoring Suppl (ACCU-CHEK GUIDE) w/Device KIT USE TO CHECK SUGAR     budesonide -formoterol  (SYMBICORT ) 80-4.5 MCG/ACT inhaler Inhale 2 puffs into the lungs in the morning and at bedtime. 1 each 5   cetirizine  (ZYRTEC ) 10 MG tablet Take 1 tablet (10 mg total) by mouth daily. 90 tablet 1   Continuous Glucose Receiver (DEXCOM G7 RECEIVER) DEVI Use to monitor BG continuously 1 each 0   Continuous Glucose Sensor (DEXCOM G7 SENSOR) MISC CHANGE SENSOR EVERY 10 DAYS 3 each 2   Continuous Glucose Transmitter (DEXCOM G6 TRANSMITTER) MISC Use to monitor glucose continuously as instructed. Change transmitter every 90 days 1 each 1   dicyclomine  (  BENTYL ) 10 MG capsule Take 1 capsule (10 mg total) by mouth 3 (three) times daily as needed for spasms. 60 capsule 1   famotidine  (PEPCID ) 20 MG tablet TAKE 1 TABLET BY MOUTH EVERYDAY AT BEDTIME 60 tablet 2   gabapentin  (NEURONTIN ) 300 MG capsule Take 300-600 mg by mouth See admin instructions. Take 1 capsule (300 mg) by mouth in the morning & take 2 capsules (600 mg) by mouth at night  6   hydrochlorothiazide  (HYDRODIURIL ) 25 MG tablet TAKE 1 TABLET BY MOUTH EVERY DAY 90 tablet 0   hydrocortisone  (ANUSOL -HC) 25 MG suppository Place 1 suppository (25 mg total) rectally 2 (two) times daily. (Patient taking differently: Place 25 mg rectally 2 (two) times daily. As needed.) 14 suppository 1   INGREZZA 40 MG capsule  Take 40 mg by mouth daily.     insulin  lispro (HUMALOG ) 100 UNIT/ML KwikPen ADMINISTER 8 TO 11 UNITS UNDER THE SKIN THREE TIMES DAILY BEFORE MEALS 15 mL 2   lamoTRIgine (LAMICTAL) 100 MG tablet Take 100 mg by mouth at bedtime.     LANTUS  SOLOSTAR 100 UNIT/ML Solostar Pen Inject 30 Units into the skin at bedtime. 15 mL 2   losartan  (COZAAR ) 50 MG tablet TAKE 1 TABLET BY MOUTH EVERY DAY 90 tablet 1   Mesalamine  800 MG TBEC TAKE 2 TABLETS BY MOUTH IN THE MORNING, AT NOON, AND AT BEDTIME 180 tablet 5   pantoprazole  (PROTONIX ) 40 MG tablet TAKE 1 TABLET(40 MG) BY MOUTH DAILY 30 tablet 3   triamcinolone  cream (KENALOG ) 0.1 % Apply 1 Application topically 2 (two) times daily. Use for two weeks at a time to affected areas. 30 g 2   No current facility-administered medications on file prior to visit.    Labs:  Lab Results  Component Value Date   HGBA1C 9.4 (A) 01/23/2024     Notable Signs/Symptoms: Increased thirsty, fatigue, increased urination  Lifestyle & Dietary Hx LIves with her daughter Not working  Eats meals at home and away from home.  Estimated daily fluid intake: 30-45 oz Supplements: none Sleep:  5-6 hrs. Stress / self-care: Has a lot of stress Current average weekly physical activity: Not much.  24-Hr Dietary Recall First Meal: 10 am Bluberry muffin and strawberry yoplait Snack:  Second Meal: skipped; not hungry Snack: Carbonated sparking water - Apple juice due to low blood sugar   6-7 pmChocolate vanilla pudding Cheetos Third Meal: 10 pm [place] cheddar broccoli rice, sauted chicken, onions/ peppers with alfredo sauce, water  Snack:  Beverages: water  and sparkling water    Estimated Energy Needs Calories: 1200 Carbohydrate: 135g Protein: 90g Fat: 33g   NUTRITION DIAGNOSIS  NB-1.1 Food and nutrition-related knowledge deficit As related to inconsistent CHO intake.  As evidenced by A1C 9.3%.SABRA   NUTRITION INTERVENTION  Nutrition education (E-1) on the following  topics:  Nutrition and Diabetes education provided on My Plate, CHO counting, meal planning, portion sizes, timing of meals, avoiding snacks between meals unless having a low blood sugar, target ranges for A1C and blood sugars, signs/symptoms and treatment of hyper/hypoglycemia, monitoring blood sugars, taking medications as prescribed, benefits of exercising 30 minutes per day and prevention of complications of DM.  Lifestyle Medicine  - Whole Food, Plant Predominant Nutrition is highly recommended: Eat Plenty of vegetables, Mushrooms, fruits, Legumes, Whole Grains, Nuts, seeds in lieu of processed meats, processed snacks/pastries red meat, poultry, eggs.    -It is better to avoid simple carbohydrates including: Cakes, Sweet Desserts, Ice Cream, Soda (diet and regular), Sweet  Tea, Candies, Chips, Cookies, Store Bought Juices, Alcohol in Excess of  1-2 drinks a day, Lemonade,  Artificial Sweeteners, Doughnuts, Coffee Creamers, Sugar-free Products, etc, etc.  This is not a complete list.....  Exercise: If you are able: 30 -60 minutes a day ,4 days a week, or 150 minutes a week.  The longer the better.  Combine stretch, strength, and aerobic activities.  If you were told in the past that you have high risk for cardiovascular diseases, you may seek evaluation by your heart doctor prior to initiating moderate to intense exercise programs.   Handouts Provided Include  Know your numbers S/s of hyper/hypoglycemia Lifestyle Medicine  Learning Style & Readiness for Change Teaching method utilized: Visual & Auditory  Demonstrated degree of understanding via: Teach Back  Barriers to learning/adherence to lifestyle change: stress level  Goals Established by Pt Goals  Eat 30 -45 grams of carbs per meal- Eat meals on time Increase lower carb vegggiees Drink only water  Walk 30 minutes 4 days per week Get TIR 75%  MONITORING & EVALUATION Dietary intake, weekly physical activity, and blood sugars  in 1 week.  Next Steps  Patient is to work on eating meals consistently at times discussed and take medication as prescribed.SABRA

## 2024-01-23 NOTE — Progress Notes (Signed)
 "                                                                                     01/23/2024, 12:59 PM     Endocrinology follow-up note  Subjective:    Patient ID: Brittney [name], female    DOB: 10-02-1970.  Brittney [name] is being seen in follow-up in the management of currently uncontrolled symptomatic type 1 diabetes, hypertension, hyperlipidemia. PMD: Toribio Jerel MATSU, MD.   Past Medical History:  Diagnosis Date   Anxiety    Arthritis    Asthma    Breast cancer [facility])    Breast cancer of upper-outer quadrant of right female breast (HCC) 06/11/2015   Triple negative, 2 cm    Chemotherapy induced neutropenia 09/10/2015   Depression    Diabetes mellitus (HCC)    Type 1 over 15 yrs   Eczema    Environmental allergies    GERD (gastroesophageal reflux disease)    Hypertension    Iron deficiency anemia due to chronic blood loss 06/12/2015   Personal history of chemotherapy    Personal history of radiation therapy    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) [facility])     Past Surgical History:  Procedure Laterality Date   BIOPSY  12/05/2018   Procedure: BIOPSY;  Surgeon: Golda Claudis PENNER, MD;  [facility];  Service: Endoscopy;;  duodenum gastric colon   BIOPSY  03/16/2022   Procedure: BIOPSY;  Surgeon: Eartha Angelia Toribio, MD;  [facility];  Service: Gastroenterology;;   BIOPSY  01/24/2023   Procedure: BIOPSY;  Surgeon: Eartha Angelia, Toribio, MD;  [facility];  Service: Gastroenterology;;   BREAST BIOPSY Left 2016   FIBROCYSTIC CHANGES WITH ASSOCIATED CALCIFICATIONS, FIBROADENOMATOID CHANGES   BREAST LUMPECTOMY Right 2017   CESAREAN SECTION  2007   COLONOSCOPY N/A 10/09/2013   Procedure: COLONOSCOPY;  Surgeon: Claudis PENNER Golda, MD;  [facility];  Service: Endoscopy;  Laterality: N/A;  100   COLONOSCOPY N/A 12/05/2018   Procedure: COLONOSCOPY;  Surgeon: Golda Claudis PENNER, MD;  [facility];   Service: Endoscopy;  Laterality: N/A;   COLONOSCOPY WITH PROPOFOL  N/A 03/16/2022   Procedure: COLONOSCOPY WITH PROPOFOL ;  Surgeon: Eartha Angelia Toribio, MD;  [facility];  Service: Gastroenterology;  Laterality: N/A;  830am, asa 1-2   DILATION AND CURETTAGE OF UTERUS     x 2 for miscarriages   ESOPHAGEAL DILATION N/A 01/24/2023   Procedure: ESOPHAGEAL DILATION;  Surgeon: Eartha Angelia Toribio, MD;  [facility];  Service: Gastroenterology;  Laterality: N/A;  9:00am;asa 2   ESOPHAGOGASTRODUODENOSCOPY     ESOPHAGOGASTRODUODENOSCOPY N/A 12/05/2018   Procedure: ESOPHAGOGASTRODUODENOSCOPY (EGD);  Surgeon: Golda Claudis PENNER, MD;  [facility];  Service: Endoscopy;  Laterality: N/A;  200pm   ESOPHAGOGASTRODUODENOSCOPY (EGD) WITH PROPOFOL  N/A 01/24/2023   Procedure: ESOPHAGOGASTRODUODENOSCOPY (EGD) WITH PROPOFOL ;  Surgeon: Eartha Angelia Toribio, MD;  [facility];  Service: Gastroenterology;  Laterality: N/A;  9:00am;asa 2   FLEXIBLE SIGMOIDOSCOPY     FLEXIBLE SIGMOIDOSCOPY  07/27/2011   Procedure: FLEXIBLE SIGMOIDOSCOPY;  Surgeon: Claudis PENNER Golda, MD;  [facility];  Service: Endoscopy;  Laterality: N/A;  100   FLEXIBLE SIGMOIDOSCOPY N/A 09/27/2023   Procedure: KINGSTON SIDE;  Surgeon: Eartha Flavors, Toribio, MD;  [facility];  Service: Gastroenterology;  Laterality: N/A;  12:45 pm, asa 1-2, pt does not want to come earlier    Social History   Socioeconomic History   Marital status: Single    Spouse name: Not on file   Number of children: Not on file   Years of education: Not on file   Highest education level: Not on file  Occupational History   Not on file  Tobacco Use   Smoking status: Never    Passive exposure: Current   Smokeless tobacco: Never  Vaping Use   Vaping status: Never Used  Substance and Sexual Activity   Alcohol use: No    Alcohol/week: 0.0 standard drinks of alcohol   Drug use:  No   Sexual activity: Not on file  Other Topics Concern   Not on file  Social History Narrative   Not on file   Social Drivers of Health   Tobacco Use: Medium Risk (01/23/2024)   Patient History    Smoking Tobacco Use: Never    Smokeless Tobacco Use: Never    Passive Exposure: Current  Financial Resource Strain: Not on file  Food Insecurity: No Food Insecurity (08/03/2023)   Received from [facility]   Epic    Within the past 12 months, you worried that your food would run out before you got the money to buy more.: Never true    Within the past 12 months, the food you bought just didn't last and you didn't have money to get more.: Never true  Transportation Needs: No Transportation Needs (08/03/2023)   Received from [facility]   PRAPARE - Transportation    Lack of Transportation (Medical): No    Lack of Transportation (Non-Medical): No  Physical Activity: Inactive (08/03/2023)   Received from [facility]   Exercise Vital Sign    On average, how many days per week do you engage in moderate to strenuous exercise (like a brisk walk)?: 0 days    On average, how many minutes do you engage in exercise at this level?: 0 min  Stress: Stress Concern Present (08/03/2023)   Received from [facility] - Occupational Stress Questionnaire    Do you feel stress - tense, restless, nervous, or anxious, or unable to sleep at night because your mind is troubled all the time - these days?: Very much  Social Connections: Not on file  Depression (PHQ2-9): Low Risk (12/14/2023)   Depression (PHQ2-9)    PHQ-2 Score: 0  Alcohol Screen: Not on file  Housing: Not on file  Utilities: Low Risk (08/03/2023)   Received from [facility]   Utilities    Within the past 12 months, have you been unable to get utilities(heat, electricity) when it was really needed?: No  Health Literacy: Not on file    Family History  Problem Relation Age of Onset    Eczema Mother    Non-Hodgkin's lymphoma Mother    Hypertension Mother    Diabetes Mellitus II Mother    Allergic rhinitis Father    Hypertension Father    Diabetes Mellitus II Father    Breast cancer Maternal Aunt    Colon cancer Maternal Uncle    Breast cancer Cousin     Outpatient Encounter  Medications as of 01/23/2024  Medication Sig   ACCU-CHEK AVIVA PLUS test strip SMARTSIG:Via Meter 6 Times Daily   Accu-Chek Softclix Lancets lancets 4 (four) times daily.   acetaminophen  (TYLENOL ) 500 MG tablet Take 1,000 mg by mouth every 6 (six) hours as needed. For pain   acyclovir  (ZOVIRAX ) 200 MG capsule Take 400 mg by mouth daily. Reported on 07/23/2015   albuterol  (VENTOLIN  HFA) 108 (90 Base) MCG/ACT inhaler Inhale 2 puffs into the lungs every 4 (four) hours as needed for wheezing or shortness of breath.   atorvastatin (LIPITOR) 20 MG tablet Take 40 mg by mouth daily.   AUVELITY 45-105 MG TBCR Take by mouth in the morning and at bedtime.   Blood Glucose Monitoring Suppl (ACCU-CHEK GUIDE) w/Device KIT USE TO CHECK SUGAR   budesonide -formoterol  (SYMBICORT ) 80-4.5 MCG/ACT inhaler Inhale 2 puffs into the lungs in the morning and at bedtime.   cetirizine  (ZYRTEC ) 10 MG tablet Take 1 tablet (10 mg total) by mouth daily.   Continuous Glucose Receiver (DEXCOM G7 RECEIVER) DEVI Use to monitor BG continuously   Continuous Glucose Sensor (DEXCOM G7 SENSOR) MISC CHANGE SENSOR EVERY 10 DAYS   Continuous Glucose Transmitter (DEXCOM G6 TRANSMITTER) MISC Use to monitor glucose continuously as instructed. Change transmitter every 90 days   dicyclomine  (BENTYL ) 10 MG capsule Take 1 capsule (10 mg total) by mouth 3 (three) times daily as needed for spasms.   famotidine  (PEPCID ) 20 MG tablet TAKE 1 TABLET BY MOUTH EVERYDAY AT BEDTIME   gabapentin  (NEURONTIN ) 300 MG capsule Take 300-600 mg by mouth See admin instructions. Take 1 capsule (300 mg) by mouth in the morning & take 2 capsules (600 mg) by mouth at night    hydrochlorothiazide  (HYDRODIURIL ) 25 MG tablet TAKE 1 TABLET BY MOUTH EVERY DAY   hydrocortisone  (ANUSOL -HC) 25 MG suppository Place 1 suppository (25 mg total) rectally 2 (two) times daily. (Patient taking differently: Place 25 mg rectally 2 (two) times daily. As needed.)   INGREZZA 40 MG capsule Take 40 mg by mouth daily.   insulin  lispro (HUMALOG ) 100 UNIT/ML KwikPen ADMINISTER 8 TO 11 UNITS UNDER THE SKIN THREE TIMES DAILY BEFORE MEALS   lamoTRIgine (LAMICTAL) 100 MG tablet Take 100 mg by mouth at bedtime.   losartan  (COZAAR ) 50 MG tablet TAKE 1 TABLET BY MOUTH EVERY DAY   Mesalamine  800 MG TBEC TAKE 2 TABLETS BY MOUTH IN THE MORNING, AT NOON, AND AT BEDTIME   pantoprazole  (PROTONIX ) 40 MG tablet TAKE 1 TABLET(40 MG) BY MOUTH DAILY   triamcinolone  cream (KENALOG ) 0.1 % Apply 1 Application topically 2 (two) times daily. Use for two weeks at a time to affected areas.   [DISCONTINUED] LANTUS  SOLOSTAR 100 UNIT/ML Solostar Pen Inject 30 Units into the skin at bedtime.   LANTUS  SOLOSTAR 100 UNIT/ML Solostar Pen Inject 32 Units into the skin at bedtime.   No facility-administered encounter medications on file as of 01/23/2024.    ALLERGIES: Allergies  Allergen Reactions   Ace Inhibitors Itching   Peanut Oil Itching   Shellfish Allergy  Itching and Rash   Statins Itching   Adhesive [Tape] Rash   Pravastatin Sodium Itching   Pedi-Pre Tape Spray [Wound Dressing Adhesive]    Shellfish Protein-Containing Drug Products     VACCINATION STATUS: Immunization History  Administered Date(s) Administered   Influenza,inj,Quad PF,6+ Mos 10/22/2015   Influenza-Unspecified 10/16/2019   Pneumococcal Polysaccharide-23 01/03/2006   Td 10/04/2006   Tdap 07/10/1996    Diabetes She presents for her follow-up diabetic  visit. She has type 1 diabetes mellitus. Onset time: She was diagnosed at approximate age of 17 years. Her disease course has been worsening. Pertinent negatives for hypoglycemia include  no confusion, nervousness/anxiousness, pallor or seizures. Pertinent negatives for diabetes include no chest pain, no fatigue, no polydipsia, no polyphagia and no polyuria. There are no hypoglycemic complications. Symptoms are improving. There are no diabetic complications. Risk factors for coronary artery disease include diabetes mellitus, dyslipidemia, obesity and sedentary lifestyle. Current diabetic treatment includes insulin  injections. Her weight is increasing steadily. She is following a generally unhealthy diet. When asked about meal planning, she reported none. She has not had a previous visit with a dietitian. Her home blood glucose trend is increasing steadily. Her breakfast blood glucose range is generally 180-200 mg/dl. Her lunch blood glucose range is generally 180-200 mg/dl. Her dinner blood glucose range is generally >200 mg/dl. Her bedtime blood glucose range is generally >200 mg/dl. Her overall blood glucose range is >200 mg/dl. (Ms. Boutelle presents with worsening glycemic profile.  Her Dexcom shows 37% time in range, 32% level 1 hyperglycemia, 26% level 2 hyperglycemia.  She has 5% mild hypoglycemia, happening at random.  Her point-of-care A1c is 9.4% increasing from 8.1% during her last visit.  Admittedly, she has not been consistently taking her Lantus  at prescribed doses, was lowering by 2 to 4 units.    ) An ACE inhibitor/angiotensin II receptor blocker is not being taken.  Hyperlipidemia This is a chronic problem. The current episode started more than 1 year ago. The problem is controlled. Pertinent negatives include no chest pain, myalgias or shortness of breath. Current antihyperlipidemic treatment includes statins (She was treated with atorvastatin in the past, did not continue on this medication complaining about cost.). Risk factors for coronary artery disease include diabetes mellitus, dyslipidemia, obesity and family history.    Review of systems  Constitutional: + Minimally  fluctuating body weight,  current  Body mass index is 36.95 kg/m. , no fatigue, no subjective hyperthermia, no subjective hypothermia    Objective:    BP 124/82   Pulse 64   Ht 5' 2 (1.575 m)   Wt 202 lb (91.6 kg)   BMI 36.95 kg/m   Wt Readings from Last 3 Encounters:  01/23/24 202 lb (91.6 kg)  01/23/24 202 lb (91.6 kg)  11/24/23 206 lb 12.8 oz (93.8 kg)     Physical Exam- Limited  Constitutional:  Body mass index is 36.95 kg/m. , not in acute distress, normal state of mind   Recent Results (from the past 2160 hours)  CBC with Differential/Platelet     Status: Abnormal   Collection Time: 11/23/23  3:16 PM  Result Value Ref Range   WBC 6.1 3.8 - 10.8 Thousand/uL   RBC 4.39 3.80 - 5.10 Million/uL   Hemoglobin 12.2 11.7 - 15.5 g/dL   HCT 61.9 64.9 - 54.9 %   MCV 86.6 80.0 - 100.0 fL   MCH 27.8 27.0 - 33.0 pg   MCHC 32.1 32.0 - 36.0 g/dL    Comment: For adults, a slight decrease in the calculated MCHC value (in the range of 30 to 32 g/dL) is most likely not clinically significant; however, it should be interpreted with caution in correlation with other red cell parameters and the patient's clinical condition.    RDW 12.5 11.0 - 15.0 %   Platelets 191 140 - 400 Thousand/uL   MPV 11.1 7.5 - 12.5 fL   Neutro Abs 3,562 1,500 - 7,800 cells/uL  Absolute Lymphocytes 2,019 850 - 3,900 cells/uL   Absolute Monocytes 519 200 - 950 cells/uL   Eosinophils Absolute 0 (L) 15 - 500 cells/uL   Basophils Absolute 0 0 - 200 cells/uL   Neutrophils Relative % 58.4 %   Total Lymphocyte 33.1 %   Monocytes Relative 8.5 %   Eosinophils Relative 0.0 %   Basophils Relative 0.0 %  Comprehensive metabolic panel with GFR     Status: Abnormal   Collection Time: 11/23/23  3:16 PM  Result Value Ref Range   Glucose, Bld 219 (H) 65 - 99 mg/dL    Comment: .            Fasting reference interval . For someone without known diabetes, a glucose value >125 mg/dL indicates that they may  have diabetes and this should be confirmed with a follow-up test. .    BUN 12 7 - 25 mg/dL   Creat 9.34 9.49 - 8.96 mg/dL   eGFR 893 > OR = 60 fO/fpw/8.26f7   BUN/Creatinine Ratio SEE NOTE: 6 - 22 (calc)    Comment:    Not Reported: BUN and Creatinine are within    reference range. .    Sodium 140 135 - 146 mmol/L   Potassium 4.3 3.5 - 5.3 mmol/L   Chloride 100 98 - 110 mmol/L   CO2 33 (H) 20 - 32 mmol/L   Calcium 9.4 8.6 - 10.4 mg/dL   Total Protein 6.3 6.1 - 8.1 g/dL   Albumin 4.3 3.6 - 5.1 g/dL   Globulin 2.0 1.9 - 3.7 g/dL (calc)   AG Ratio 2.2 1.0 - 2.5 (calc)   Total Bilirubin 0.5 0.2 - 1.2 mg/dL   Alkaline phosphatase (APISO) 91 37 - 153 U/L   AST 21 10 - 35 U/L   ALT 23 6 - 29 U/L  C-reactive protein     Status: None   Collection Time: 11/23/23  3:16 PM  Result Value Ref Range   CRP 3.2 <8.0 mg/L  POCT glycosylated hemoglobin (Hb A1C)     Status: Abnormal   Collection Time: 01/23/24 11:48 AM  Result Value Ref Range   Hemoglobin A1C 9.4 (A) 4.0 - 5.6 %   HbA1c POC (<> result, manual entry)     HbA1c, POC (prediabetic range)     HbA1c, POC (controlled diabetic range)       Diabetic Labs (most recent): Lab Results  Component Value Date   HGBA1C 9.4 (A) 01/23/2024   HGBA1C 8.1 (A) 09/19/2023   HGBA1C 9.3 05/09/2023   MICROALBUR 80 06/09/2020   MICROALBUR 2.7 12/02/2019   MICROALBUR 2.3 05/23/2018     Lipid Panel ( most recent) Lipid Panel     Component Value Date/Time   CHOL 166 05/09/2023 0000   CHOL 161 04/10/2023 1029   TRIG 52 05/09/2023 0000   HDL 71 (A) 05/09/2023 0000   HDL 68 04/10/2023 1029   CHOLHDL 2.4 04/10/2023 1029   CHOLHDL 2.3 08/29/2019 0942   LDLCALC 85 05/09/2023 0000   LDLCALC 83 04/10/2023 1029   LDLCALC 73 08/29/2019 0942      Latest Ref Rng & Units 11/23/2023    3:16 PM 09/05/2023   10:19 AM 08/25/2023   10:47 AM  CMP  Glucose 65 - 99 mg/dL 780  749  865   BUN 7 - 25 mg/dL 12  11  19    Creatinine 0.50 - 1.03 mg/dL 9.34   9.34  9.30   Sodium 135 - 146  mmol/L 140  141  142   Potassium 3.5 - 5.3 mmol/L 4.3  4.0  4.1   Chloride 98 - 110 mmol/L 100  102  103   CO2 20 - 32 mmol/L 33  28  33   Calcium 8.6 - 10.4 mg/dL 9.4  8.9  9.4   Total Protein 6.1 - 8.1 g/dL 6.3  6.6  6.1   Total Bilirubin 0.2 - 1.2 mg/dL 0.5  0.8  0.4   Alkaline Phos 38 - 126 U/L  87    AST 10 - 35 U/L 21  22  28    ALT 6 - 29 U/L 23  25  31       Assessment & Plan:   1. Uncontrolled type 1 diabetes mellitus with hyperglycemia (HCC)  - Milca S Moncure has currently uncontrolled symptomatic type 2 DM since 54 years of age.  Ms. Payano presents with worsening glycemic profile.  Her Dexcom shows 37% time in range, 32% level 1 hyperglycemia, 26% level 2 hyperglycemia.  She has 5% mild hypoglycemia, happening at random.  Her point-of-care A1c is 9.4% increasing from 8.1% during her last visit.  Admittedly, she has not been consistently taking her Lantus  at prescribed doses, was lowering by 2 to 4 units.     Her recent anti-GAD antibody elevated at 61 suggesting type 1 diabetes.   -her diabetes is complicated by obesity/sedentary life and she remains at a high risk for more acute and chronic complications which include CAD, CVA, CKD, retinopathy, and neuropathy. These are all discussed in detail with her.  - I have counseled her on diet management and weight loss, by adopting a carbohydrate restricted/protein rich diet.  - she did not engage optimally, however she acknowledges that there is a room for improvement in her food and drink choices.   - she acknowledges that there is a room for improvement in her food and drink choices. - Suggestion is made for her to avoid simple carbohydrates  from her diet including Cakes, Sweet Desserts, Ice Cream, Soda (diet and regular), Sweet Tea, Candies, Chips, Cookies, Store Bought Juices, Alcohol , Artificial Sweeteners,  Coffee Creamer, and Sugar-free Products, Lemonade. This will help patient to  have more stable blood glucose profile and potentially avoid unintended weight gain.  - I encouraged her to switch to  unprocessed or minimally processed complex starch and increased protein intake (mostly plant source), fruits, and vegetables.  - she is advised to stick to a routine mealtimes to eat 3 meals  a day and avoid unnecessary snacks ( to snack only to correct hypoglycemia).    - I have approached her with the following individualized plan to manage diabetes and patient agrees:   -She will continue to need intensive treatment with basal/bolus insulin  in order for her to achieve and maintain control of diabetes to target.     -Based on her presentation with significant hyperglycemia and history of hypoglycemia unawareness, her insulin  will be adjusted carefully.  She is encouraged to continue to utilize her CGM, will order Dexcom G7 for her.    -She is advised to increase Lantus  to 32 units nightly, continue Humalog  at 8-11 units for pre-meal blood glucose readings above 80 mg/day.  -  she is allowed to use tight scale  sliding scale Humalog  for  hyperglycemia above 150 mg per DL.  She is advised to continue monitoring blood glucose 4 times a day-before meals and at bedtime. - she is encouraged to call clinic for  blood glucose levels less than 70 or above 200 mg /dl. - she is warned not to take insulin  without proper monitoring per orders. - Adjustment parameters are given to her for hypo and hyperglycemia in writing.  - she is not a suitable candidate  for metformin, SGLT2 inhibitors, nor incretin therapy.   - Patient specific target  A1c;  LDL, HDL, Triglycerides, were discussed in detail.  2) Blood Pressure /Hypertension:  -Her blood pressure is controlled to target.  She has urine microalbuminuria.  She is currently  on losartan  50 mg daily.  I discussed and added hydrochlorothiazide  25 mg p.o. daily at breakfast.   3) Lipids/Hyperlipidemia: Review of her most recent lipid  panel showed controlled LDL at 85.  She is advised to continue atorvastatin 20 mg daily.     Side effects and precautions discussed with her.        4)  Weight/Diet: Her BMI is 36.95 kg/m-  clearly complicating her diabetes care.  A candidate for modest weight loss, may avoid further weight gain by avoiding unnecessary snacks.  I discussed with her the fact that loss of 5 - 10% of her  current body weight will have the most impact on her diabetes management.   CDE Consult will be initiated . Exercise, and detailed carbohydrates information provided  -  detailed on discharge instructions.  5) Chronic Care/Health Maintenance:  -she  Is not on ACEI/ARB and Statin medications and  is encouraged to initiate and continue to follow up with Ophthalmology, Dentist,  Podiatrist at least yearly or according to recommendations, and advised to   stay away from smoking. I have recommended yearly flu vaccine and pneumonia vaccine at least every 5 years; moderate intensity exercise for up to 150 minutes weekly; and  sleep for at least 7 hours a day.  - she is  advised to maintain close follow up with Toribio Jerel MATSU, MD for primary care needs, as well as her other providers for optimal and coordinated care.  I spent  41 minutes in the care of the patient today including review of labs from CMP, Lipids, Thyroid Function, Hematology (current and previous including abstractions from other facilities); face-to-face time discussing  her blood glucose readings/logs, discussing hypoglycemia and hyperglycemia episodes and symptoms, medications doses, her options of short and long term treatment based on the latest standards of care / guidelines;  discussion about incorporating lifestyle medicine;  and documenting the encounter. Risk reduction counseling performed per USPSTF guidelines to reduce  obesity and cardiovascular risk factors.     Please refer to Patient Instructions for Blood Glucose Monitoring and  Insulin /Medications Dosing Guide  in media tab for additional information. Please  also refer to  Patient Self Inventory in the Media  tab for reviewed elements of pertinent patient history.  Chuckie GORMAN Pinal participated in the discussions, expressed understanding, and voiced agreement with the above plans.  All questions were answered to her satisfaction. she is encouraged to contact clinic should she have any questions or concerns prior to her return visit.  Dear Patient: Feel free to review your progress notes.  If you are reviewing this progress note and have questions about the meaning of /or medical terms being used, please make a note and address it at your next follow-up appointment.  Medical notes are meant to be a communication tool between medical professionals and require medical terms to be used for efficiency and insurance approval.      Follow up plan: -  Return in about 4 months (around 05/22/2024) for Bring Meter/CGM Device/Logs- A1c in Office.  Ranny Earl, MD [facility] [address] [place], [place] [address] Phone: [phone]  Fax: [phone]    01/23/2024, 12:59 PM  This note was partially dictated with voice recognition software. Similar sounding words can be transcribed inadequately or may not  be corrected upon review.  "

## 2024-01-23 NOTE — Patient Instructions (Signed)
 Eat 30 -45 grams of carbs per meal- Eat meals on time Increase lower carb vegggiees Drink only water  Walk 30 minutes 4 days per week Get TIR 75%

## 2024-01-23 NOTE — Patient Instructions (Signed)

## 2024-02-01 ENCOUNTER — Other Ambulatory Visit: Payer: Self-pay

## 2024-02-01 DIAGNOSIS — E1065 Type 1 diabetes mellitus with hyperglycemia: Secondary | ICD-10-CM

## 2024-02-01 MED ORDER — DEXCOM G7 RECEIVER DEVI: 0 refills | Status: AC

## 2024-02-06 ENCOUNTER — Telehealth: Payer: Self-pay

## 2024-02-08 ENCOUNTER — Encounter: Attending: Gastroenterology | Admitting: Internal Medicine

## 2024-02-08 VITALS — BP 128/76 | HR 72 | Temp 98.3°F | Resp 16

## 2024-02-08 DIAGNOSIS — K51311 Ulcerative (chronic) rectosigmoiditis with rectal bleeding: Secondary | ICD-10-CM

## 2024-02-08 MED ORDER — VEDOLIZUMAB 300 MG IV SOLR
300.0000 mg | Freq: Once | INTRAVENOUS | Status: AC
  Administered 2024-02-08: 300 mg via INTRAVENOUS
  Filled 2024-02-08: qty 5

## 2024-02-08 NOTE — Progress Notes (Signed)
 Diagnosis:  Ulcerative Colitis  Provider:  Shaaron Charleston MD  Procedure: IV Infusion  IV Type: Peripheral, IV Location: L Antecubital  , Entyvio  (Vedolizumab ), Dose: 300 mg  Infusion Start Time: 1019  Infusion Stop Time: 1103  Post Infusion IV Care: Observation period completed  Discharge: Condition: Good, Destination: Home . AVS Declined  Performed by:  Gordie Belvin R, LPN

## 2024-02-23 ENCOUNTER — Ambulatory Visit: Admitting: Family Medicine

## 2024-04-04 ENCOUNTER — Ambulatory Visit

## 2024-05-30 ENCOUNTER — Encounter: Admitting: Nutrition

## 2024-05-30 ENCOUNTER — Ambulatory Visit: Admitting: "Endocrinology

## 2024-08-12 ENCOUNTER — Other Ambulatory Visit

## 2024-08-12 ENCOUNTER — Ambulatory Visit (HOSPITAL_COMMUNITY)

## 2024-08-19 ENCOUNTER — Ambulatory Visit: Admitting: Physician Assistant
